# Patient Record
Sex: Male | Born: 1945
Health system: Southern US, Community
[De-identification: ages and names within clinical notes are randomized; demographics above are authoritative.]

## PROBLEM LIST (undated history)

## (undated) DIAGNOSIS — M109 Gout, unspecified: Secondary | ICD-10-CM

## (undated) DIAGNOSIS — R42 Dizziness and giddiness: Secondary | ICD-10-CM

## (undated) DIAGNOSIS — Z87442 Personal history of urinary calculi: Secondary | ICD-10-CM

## (undated) DIAGNOSIS — F419 Anxiety disorder, unspecified: Secondary | ICD-10-CM

## (undated) DIAGNOSIS — J45909 Unspecified asthma, uncomplicated: Secondary | ICD-10-CM

## (undated) DIAGNOSIS — I10 Essential (primary) hypertension: Secondary | ICD-10-CM

## (undated) DIAGNOSIS — K760 Fatty (change of) liver, not elsewhere classified: Secondary | ICD-10-CM

## (undated) DIAGNOSIS — I4891 Unspecified atrial fibrillation: Secondary | ICD-10-CM

## (undated) DIAGNOSIS — F32A Depression, unspecified: Secondary | ICD-10-CM

## (undated) DIAGNOSIS — N289 Disorder of kidney and ureter, unspecified: Secondary | ICD-10-CM

## (undated) DIAGNOSIS — F329 Major depressive disorder, single episode, unspecified: Secondary | ICD-10-CM

## (undated) DIAGNOSIS — M1990 Unspecified osteoarthritis, unspecified site: Secondary | ICD-10-CM

## (undated) DIAGNOSIS — R0600 Dyspnea, unspecified: Secondary | ICD-10-CM

## (undated) DIAGNOSIS — R748 Abnormal levels of other serum enzymes: Secondary | ICD-10-CM

## (undated) DIAGNOSIS — E785 Hyperlipidemia, unspecified: Secondary | ICD-10-CM

## (undated) DIAGNOSIS — R252 Cramp and spasm: Secondary | ICD-10-CM

## (undated) HISTORY — DX: Unspecified osteoarthritis, unspecified site: M19.90

## (undated) HISTORY — PX: OTHER SURGICAL HISTORY: SHX169

## (undated) HISTORY — DX: Abnormal levels of other serum enzymes: R74.8

## (undated) HISTORY — DX: Gout, unspecified: M10.9

## (undated) HISTORY — PX: KNEE SURGERY: SHX244

## (undated) HISTORY — DX: Anxiety disorder, unspecified: F41.9

## (undated) HISTORY — PX: HEMORROIDECTOMY: SUR656

## (undated) HISTORY — PX: COLON SURGERY: SHX602

## (undated) HISTORY — DX: Major depressive disorder, single episode, unspecified: F32.9

## (undated) HISTORY — DX: Essential (primary) hypertension: I10

## (undated) HISTORY — DX: Depression, unspecified: F32.A

## (undated) HISTORY — PX: CHOLECYSTECTOMY: SHX55

## (undated) HISTORY — DX: Hyperlipidemia, unspecified: E78.5

## (undated) HISTORY — PX: EYE SURGERY: SHX253

## (undated) HISTORY — DX: Fatty (change of) liver, not elsewhere classified: K76.0

## (undated) HISTORY — PX: CARPAL TUNNEL RELEASE: SHX101

## (undated) HISTORY — DX: Unspecified atrial fibrillation: I48.91

---

## 2001-09-22 ENCOUNTER — Emergency Department (HOSPITAL_COMMUNITY): Admission: EM | Admit: 2001-09-22 | Discharge: 2001-09-22 | Payer: Self-pay | Admitting: *Deleted

## 2001-09-22 ENCOUNTER — Encounter: Payer: Self-pay | Admitting: *Deleted

## 2001-11-16 ENCOUNTER — Emergency Department (HOSPITAL_COMMUNITY): Admission: EM | Admit: 2001-11-16 | Discharge: 2001-11-17 | Payer: Self-pay | Admitting: *Deleted

## 2001-11-16 ENCOUNTER — Encounter: Payer: Self-pay | Admitting: *Deleted

## 2001-11-22 ENCOUNTER — Ambulatory Visit (HOSPITAL_COMMUNITY): Admission: RE | Admit: 2001-11-22 | Discharge: 2001-11-22 | Payer: Self-pay | Admitting: Family Medicine

## 2001-11-22 ENCOUNTER — Encounter: Payer: Self-pay | Admitting: Family Medicine

## 2001-11-30 ENCOUNTER — Ambulatory Visit (HOSPITAL_COMMUNITY): Admission: RE | Admit: 2001-11-30 | Discharge: 2001-11-30 | Payer: Self-pay | Admitting: General Surgery

## 2002-07-10 ENCOUNTER — Ambulatory Visit (HOSPITAL_COMMUNITY): Admission: RE | Admit: 2002-07-10 | Discharge: 2002-07-10 | Payer: Self-pay | Admitting: Family Medicine

## 2003-08-08 ENCOUNTER — Ambulatory Visit (HOSPITAL_COMMUNITY): Admission: RE | Admit: 2003-08-08 | Discharge: 2003-08-08 | Payer: Self-pay | Admitting: Family Medicine

## 2005-05-07 ENCOUNTER — Ambulatory Visit: Payer: Self-pay | Admitting: Orthopedic Surgery

## 2005-06-22 ENCOUNTER — Ambulatory Visit: Payer: Self-pay | Admitting: Orthopedic Surgery

## 2005-09-24 ENCOUNTER — Encounter (INDEPENDENT_AMBULATORY_CARE_PROVIDER_SITE_OTHER): Payer: Self-pay | Admitting: Orthopaedic Surgery

## 2005-09-24 ENCOUNTER — Ambulatory Visit (HOSPITAL_COMMUNITY): Admission: RE | Admit: 2005-09-24 | Discharge: 2005-09-24 | Payer: Self-pay | Admitting: Orthopaedic Surgery

## 2006-02-09 ENCOUNTER — Encounter (INDEPENDENT_AMBULATORY_CARE_PROVIDER_SITE_OTHER): Payer: Self-pay | Admitting: *Deleted

## 2006-02-09 ENCOUNTER — Ambulatory Visit (HOSPITAL_COMMUNITY): Admission: RE | Admit: 2006-02-09 | Discharge: 2006-02-09 | Payer: Self-pay | Admitting: Orthopaedic Surgery

## 2006-06-07 ENCOUNTER — Ambulatory Visit (HOSPITAL_COMMUNITY): Admission: RE | Admit: 2006-06-07 | Discharge: 2006-06-07 | Payer: Self-pay | Admitting: Family Medicine

## 2006-07-06 ENCOUNTER — Encounter (INDEPENDENT_AMBULATORY_CARE_PROVIDER_SITE_OTHER): Payer: Self-pay | Admitting: *Deleted

## 2006-07-06 ENCOUNTER — Ambulatory Visit (HOSPITAL_COMMUNITY): Admission: RE | Admit: 2006-07-06 | Discharge: 2006-07-06 | Payer: Self-pay | Admitting: Orthopaedic Surgery

## 2009-02-20 ENCOUNTER — Ambulatory Visit (HOSPITAL_COMMUNITY): Admission: RE | Admit: 2009-02-20 | Discharge: 2009-02-20 | Payer: Self-pay | Admitting: Family Medicine

## 2009-07-27 HISTORY — PX: COLONOSCOPY: SHX174

## 2010-06-05 ENCOUNTER — Ambulatory Visit (HOSPITAL_COMMUNITY): Admission: RE | Admit: 2010-06-05 | Discharge: 2010-06-05 | Payer: Self-pay | Admitting: Internal Medicine

## 2010-06-05 ENCOUNTER — Ambulatory Visit: Payer: Self-pay | Admitting: Internal Medicine

## 2010-12-12 NOTE — Op Note (Signed)
NAME:  Jack Mejia, Jack Mejia               ACCOUNT NO.:  [id number]   MEDICAL RECORD NO.:  [id number]          PATIENT TYPE:  AMB   LOCATION:  DAY                           FACILITY:  [facility]   PHYSICIAN:  J. Sanjuana Kava, M.D. DATE OF BIRTH:  Dec 09, 1945   DATE OF PROCEDURE:  09/24/2005  DATE OF DISCHARGE:                                 OPERATIVE REPORT   PREOPERATIVE DIAGNOSIS:  Carpal tunnel syndrome, left, plus left long  trigger finger (stenosing tenosynovitis).   POSTOPERATIVE DIAGNOSIS:  Carpal tunnel syndrome, left, plus left long  trigger finger (stenosing tenosynovitis).   PROCEDURE:  Release of the left volar carpal ligament, saline neurolysis  aponeurotomy and also release of the left A1 pulley to the long finger.   ANESTHESIA:  Anesthesia was obtained bier block, then modified anesthetic  care.   Volar plasty splint applied at the end of the procedure. No drains.  Tourniquet time is listed in the anesthesia record; please refer to that.   INDICATIONS:  The patient is a 65 year old male with carpal tunnel syndrome.  He has had nerve studies showing it is severe, actually is worse on the  right than the left, but he is symptomatic more on the left. He has locking  of the left long finger. I have explained the risks and imponderables of the  procedure with him. I told him we could do both the finger and the carpal  tunnel release at the same time. He understands and agrees to the procedure  as outlined.   DESCRIPTION OF PROCEDURE:  The patient was seen in the holding area. The  left hand was identified as the correct surgical site. The left palm area  had a place marked and also over the left palmar aspect of the long finger.  The patient brought back to the operating room and given bier block  anesthesia. He was prepped and draped in the usual manner. As we made the  incision, he had pain. I stopped. Anesthesia attempted to do other things to  control his pain but  unsuccessful, so they went in and did a modified  anesthetic care with a general-type anesthetic. Incision was then made. The  volar carpal ligament area was exposed. The median nerve was identified and  a vessel loop placed around it. A groove director placed in the carpal  tunnel space, and the volar carpal ligament was incised. It was obvious that  median nerve has been compressed significantly. Saline neurolysis  aponeurotomy carried out. The retinaculum cut proximally. Nerve inspected;  no apparent injuries. Wound reapproximated using 3-0 nylon interrupted  vertical. Long finger incision was made. The A1 pulley was identified.  Tendon sheath was identified. The neurovascular bundles were identified and  retracted. Incision was made in the volar portion of the A1 pulley and was  incised and excised. We could see there was a defect on the tendon itself  and the flexors. I passively moved the finger and could see where it had  been rubbing. Proximally, some excess tendon sheath was removed. There was  no further locking but doing it  passively because he had a general  anesthetic and could not move his fingers actively. Neurovascular bundles  were inspected. No apparent injury. Wound was reapproximated using 3-0 nylon  interrupted  vertical mattress manner. Sterile dressing applied. The patient will be  given Vicodin ES for pain. I will see him in the office in approximately 10  days to 2 weeks. If any difficulties, he is to contact me through the office  or hospital beeper system. Numbers have been provided.           ______________________________  Lenna Sciara. Sanjuana Kava, M.D.     JWK/MEDQ  D:  09/24/2005  T:  09/24/2005  Job:  [id number]

## 2010-12-12 NOTE — Op Note (Signed)
[facility]  Patient:    Jack Mejia, Jack Mejia Visit Number: [id number] MRN: [id number]          Service Type: DSU Location: DAY Attending Physician:  Jamesetta So Dictated by:   Aviva Signs, M.D. Proc. Date: 11/30/01 Admit Date:  11/30/2001   CC:         Rosemary Holms, M.D.   Operative Report  AGE:  65 years old.  PREOPERATIVE DIAGNOSIS:  Cholecystitis and cholelithiasis.  POSTOPERATIVE DIAGNOSIS:  Cholecystitis and cholelithiasis.  PROCEDURE:  Laparoscopic cholecystectomy.  SURGEON:  Aviva Signs, M.D.  ANESTHESIA:  General endotracheal.  INDICATIONS:  The patient is a 65 year old white male who presents with biliary colic secondary to cholelithiasis.  The risks and benefits of the procedure including bleeding, infection, hepatobiliary injury, and the possibility of an open procedure were fully explained to the patient. I gave him an informed consent.  PROCEDURE NOTE:  The patient was placed in the supine position.  After the induction of general endotracheal anesthesia, the abdomen was prepped and draped using the usual sterile technique with Betadine.  A supraumbilical incision was made down to the fascia.  A Verres needle was introduced into the abdominal cavity, and confirmation of placement was done using the saline drop test.  The abdomen was then insufflated to 16 mmHg.  An 11 mm trocar was introduced into the abdominal cavity under direct visualization without difficulty.  The patient was placed in a reverse Trendelenburg position, and an additional 11 mm trocar was placed in the epigastric region, and 5 mm trocars were placed in the right upper quadrant and right flank regions. The liver was inspected and noted to be within normal limits.  The gallbladder was retracted superiorly and laterally.  The dissection was begun around the infundibulum of the gallbladder.  The cystic duct was first identified.  Its juncture to the  infundibulum was fully identified, and the clips were placed proximally and distally on the cystic duct, and the cystic duct was divided.  This was likewise done to the cystic artery. The gallbladder was then freed away from the gallbladder fossa using Bovie electrocautery. The gallbladder was delivered through the epigastric trocar site using an endocatch bag.  The gallbladder fossa was inspected, and no abnormal bleeding or bile leakage was noted.  Surgicel was placed in the gallbladder fossa.  The subhepatic space, as well as the right hepatic gutter were irrigated with normal saline.  All fluid and air were then evacuated from the abdominal cavity prior to removal of the trocars.  All wounds were irrigated with normal saline.  All wounds were injected with 0.5% Marcaine.  The supraumbilical fascia, as well as epigastric fascia were reapproximated using a 0 Vicryl interrupted suture.  All skin incisions were closed using staples.  Betadine ointment and dry, sterile dressings were applied.  All tape and needle counts were correct at the end of the procedure.  The patient was extubated in the operating room and went back to the recovery room, awake and in stable condition.  COMPLICATIONS:  None.  SPECIMENS:  Gallbladder with stones.  BLOOD LOSS:  Minimal. Dictated by:   Aviva Signs, M.D. Attending Physician:  Jamesetta So DD:  11/30/01 TD:  12/01/01 Job: [id number]

## 2010-12-12 NOTE — H&P (Signed)
[facility]  Patient:    Jack Mejia, Jack Mejia Visit Number: [id number] MRN: [id number]          Service Type: OUT Location: RAD Attending Physician:  Rubbie Battiest Dictated by:   Aviva Signs, M.D. Admit Date:  11/22/2001 Discharge Date: 11/22/2001   CC:         Margaretmary Eddy, M.D.   History and Physical  DATE OF BIRTH:  10/09/1945  CHIEF COMPLAINT:  Biliary colic, cholelithiasis.  HISTORY OF PRESENT ILLNESS:  The patient is a 65 year old white male who is referred for evaluation and treatment of right upper quadrant abdominal pain and nausea.  This has been occurring with increasing intensity over the past few weeks.  He is having right upper quadrant abdominal pain with radiation around to the right flank, nausea, vomiting, and bloating.  Food has not been helpful.  No fever, chills, or jaundice have been noted.  PAST MEDICAL HISTORY:  Unremarkable.  PAST SURGICAL HISTORY:  Past surgical history includes hemorrhoidectomy, eye surgery.  CURRENT MEDICATIONS:  None.  ALLERGIES:  No known drug allergies.  REVIEW OF SYSTEMS:  Unremarkable.  PHYSICAL EXAMINATION:  GENERAL:  On physical examination, the patient is a well-developed, well-nourished white male in no acute distress.  VITAL SIGNS:  He is afebrile and vital signs are stable.  HEENT:  Examination reveals no scleral icterus.  LUNGS:  Clear to auscultation with equal breath sounds bilaterally.  HEART:  Examination reveals a regular rate and rhythm, without S3, S4 or murmurs.   ABDOMEN:  Soft and nondistended.  He is tenderness to palpation in the right upper quadrant.  No hepatosplenomegaly, masses, or herniae are identified.  LABORATORY AND ACCESSORY DATA:  Ultrasound of the gallbladder reveals sludge.  IMPRESSION: 1. Biliary colic. 2. Cholelithiasis.  PLAN:  The patient is scheduled for a laparoscopic cholecystectomy on Nov 30, 2001.  The risks and benefits of the  procedure including bleeding, infection, hepatobiliary injury, and the possibility of an open procedure were fully explained to the patient, who gave informed consent. Dictated by:   Aviva Signs, M.D. Attending Physician:  Rubbie Battiest DD:  11/24/01 TD:  11/25/01 Job: [id number]

## 2010-12-12 NOTE — Op Note (Signed)
NAME:  Jack Mejia, Jack Mejia               ACCOUNT NO.:  [id number]   MEDICAL RECORD NO.:  [id number]          PATIENT TYPE:  AMB   LOCATION:                                FACILITY:  [facility]   PHYSICIAN:  J. Sanjuana Kava, M.D. DATE OF BIRTH:  [birth year]   DATE OF PROCEDURE:  DATE OF DISCHARGE:                                 OPERATIVE REPORT   PREOPERATIVE DIAGNOSIS:  Carpal tunnel syndrome, right.   POSTOPERATIVE DIAGNOSIS:  Carpal tunnel syndrome, right.   PROCEDURE:  Open release of the volar carpal ligament, right wrist.  Saline  neurolysis epineurotomy, right.   ANESTHESIA:  General.   TOURNIQUET TIME:  Fourteen minutes.   DRAINS:  None.   Volar plaster splint applied at the end of the procedure.   SURGEON:  J. Sanjuana Kava, M.D.   INDICATIONS:  A 65 year old male with positive EMG showing bilateral carpal  tunnel syndrome.  He has undergone carpal tunnel release on the left by me  and has done well.  Now he has carpal tunnel syndrome symptoms on the right.  He has had previous conservative treatment.  Has had splinting, anti-  inflammatories and rest.  He has got decreased sensation in the median nerve  distribution.  He entrusted me with the procedure and understands the risks  and imponderables.   DESCRIPTION OF PROCEDURE:  The patient was seen in the holding area.  The  right wrist was identified as the correct surgical site.  He placed a marker  on the right wrist.  I placed a marker on the right volar wrist.  He was  taken back to the operating room and given general anesthesia and laid  supine on the operating room table.  Hand table was attached.  Tourniquet  placed deflated on the right upper arm.  Prepped and draped in the usual  manner.  A time-out reidentified Mr. Maunu as the patient and the right  wrist as the correct surgical site.   The arm was elevated with circumferential Esmarch bandage.  Tourniquet  inflated to 300 mmHg.  Incision made and with careful  dissection the median  nerve was identified proximally and vessel loop placed around the nerve.  A  groove director was then inserted.  The volar carpal ligament was incised.  There was obvious compression to the nerve.  The nerve was freed proximally  in the retinacular area.  Saline neurolysis epineurotomy out.  The nerve was  inspected and no apparent injury.  Wound approximation with 3-0 nylon  interrupted vertical mattress manner.  Sterile dressing applied.  Bulky  dressing applied.  Sheet cotton applied.  Sheet cotton cut dorsally.  Volar  plaster  splint applied.  Ace bandage applied loosely.  Tolerated the procedure well  and went to recovery in good condition.  Prescription for Vicodin ES given  for pain.  I will see him in the office in approximately 2 weeks.  If he has  any difficulties, to contact me at the office, hospital beeper system.  Numbers have been provided.  ______________________________  Lenna Sciara. Sanjuana Kava, M.D.     JWK/MEDQ  D:  02/09/2006  T:  02/09/2006  Job:  [id number]

## 2010-12-12 NOTE — H&P (Signed)
NAME:  Jack Mejia, Jack Mejia               ACCOUNT NO.:  [id number]   MEDICAL RECORD NO.:  [id number]           PATIENT TYPE:  AMB   LOCATION:  DAY                           FACILITY:  [facility]   PHYSICIAN:  J. Sanjuana Kava, M.D. DATE OF BIRTH:  [birth year]   DATE OF ADMISSION:  DATE OF DISCHARGE:  LH                                HISTORY & PHYSICAL   CHIEF COMPLAINT:  Carpal tunnel syndrome and trigger finger.   The patient is a 65 year old male with several-month history of pain and  tenderness in both hands.  He has been dropping things.  There is night  pain, night paresthesias.  Problem is getting worse.  Dr. Merlene Laughter did a  nerve conduction study on June 04, 2005, which showed severe carpal  tunnel on the right, moderate carpal tunnel on the left, with a sensory  polyneuropathy.  He questions the patient had a tendency toward diabetes.  The patient has been seen by Dr. Wolfgang Phoenix and says his blood work has been  negative.  The patient is not getting any better and wants to have surgery  done at this time. Risks and imponderables of the procedure have been  discussed with the patient who appears to understand and agrees to procedure  as outlined.   There is another problem.  He is having problems with his hand and popping  and locking of the left long finger.  He has a left long trigger finger.  He  would like to go ahead and have this resolved at the same time.   PAST MEDICAL HISTORY:  Negative.  Denies heart disease, lung disease, kidney  disease, stroke, paralysis, weakness, hypertension, diabetes, night fever,  cancer, peptic ulcer disease, cervical problems.   ALLERGIES:  DEMEROL.   MEDICATIONS:  He is currently on no medications.   SOCIAL HISTORY:  He does not smoke or drink alcoholic beverages.  He works  for the [employer].  He lives in [place].   PAST SURGICAL HISTORY:  1.  Status post hemorrhoid surgery.  2.  Cholecystectomy.  3.  Cataract surgery.   FAMILY  HISTORY:  His mother had lung cancer.   PHYSICAL EXAMINATION:  GENERAL:  The patient is alert, cooperative,  oriented.  VITAL SIGNS: Blood pressure 170/78, pulse 88, respirations 18, afebrile.  He  is 5 feet 10 inches, 247 pounds.  HEENT: Negative.  NECK:  Supple.  LUNGS: Clear to percussion and auscultation.  EXTREMITIES: Bilateral Tinel's sign, bilateral Phalen's sign in both wrists.  Decreased sensation median nerve distribution.  Left long finger has some  triggering.  Neurovascularly intact.  Other extremities normal.  CNS: Intact.  SKIN: Intact.   IMPRESSION:  1.  Carpal tunnel syndrome bilaterally, left greater than right.  2.  Long finger.   Labs are pending.                                            ______________________________  J. Sanjuana Kava, M.D.  JWK/MEDQ  D:  09/23/2005  T:  09/23/2005  Job:  [id number]

## 2010-12-12 NOTE — Op Note (Signed)
NAME:  Jack Mejia, Jack Mejia               ACCOUNT NO.:  [id number]   MEDICAL RECORD NO.:  [id number]          PATIENT TYPE:  AMB   LOCATION:  DAY                           FACILITY:  [facility]   PHYSICIAN:  J. Sanjuana Kava, M.D. DATE OF BIRTH:  22-Dec-1945   DATE OF PROCEDURE:  07/06/2006  DATE OF DISCHARGE:                               OPERATIVE REPORT   PREOPERATIVE DIAGNOSIS:  Tear medial meniscus left knee.   POSTOPERATIVE DIAGNOSIS:  Tear medial meniscus left knee.   PROCEDURE:  Operative arthroscopy, partial medial meniscectomy using the  laser.   ANESTHESIA:  General.   TOURNIQUET TIME:  25 minutes.   SURGEON:  J. Sanjuana Kava, M.D.   INDICATIONS FOR PROCEDURE:  The patient is a 65 year old male with pain  and tenderness in his left knee with giving way, pain, and swelling.  MRI shows tear at posterior horn of medial meniscus.  He has  degenerative joint disease.  He has not improved with conservative  treatment.  Risks and imponderables of the procedure were discussed  preoperatively.  He appeared to understand and agreed to the procedure  as outlined.   DESCRIPTION OF PROCEDURE:  The patient was seen in the holding area.  He  identified the left knee as the correct surgical site and placed a mark  on it and I placed a mark on the left knee.  He was brought to the  operating room and placed supine.  He was given general anesthesia.  A  leg holder and tourniquet placed deflated on the left upper thigh.  He  was prepped and draped in the usual fashion.  At that time I identified  the left knee as the correct surgical site and [name] as the correct  patient.  The leg was elevated and wrapped circumferentially with  Esmarch bandage.  Tourniquet inflated to 300 mmHg.  Esmarch bandage  removed.  The inflow cannula was inserted medially and lead to  __________ by an infusion pump.  Arthroscope inserted laterally and the  knee systematically examined.   Findings:  There was  some grade 2-3 changes on the surface of the  patella at the patellofemoral joint, mild synovitis present.  Medially  there was a small tear in the posterior horn of the medial meniscus.  He  had significant degenerative joint disease grade 2 to early grade 4  changes of femoral condyle and somewhat on the tibial plateau medially.  Anterior cruciate was intact laterally.  The menisci looked good and  there was grade 2 changes there.  No loose bodies.   Using a meniscal punch, the shaver, and the Holmium laser, the meniscus  was removed from the posterior horn and good smooth contour was  obtained.  Permanent pictures were taken.  The knee was systemically  reexamined and no pathology found.  Wound was reapproximated with 3-0  nylon interrupted vertical mattress manner.  Marcaine  0.25% instilled in each portal.  Tourniquet deflated for 25 minutes.  Sterile dressing applied and bulky dressing applied.  The patient was  given Vicodin ES for pain and I will  see him in the office in  approximately 10 days to 2 weeks.  Physical therapy has been arranged.  She is to call with any difficulty or any problems.  He has the numbers.           ______________________________  Lenna Sciara. Sanjuana Kava, M.D.     JWK/MEDQ  D:  07/06/2006  T:  07/06/2006  Job:  [id number]

## 2010-12-12 NOTE — Procedures (Signed)
   NAME:  AMILIO, JUNIPER                         ACCOUNT NO.:  [id number]   MEDICAL RECORD NO.:  [id number]                   PATIENT TYPE:  OUT   LOCATION:  RAD                                  FACILITY:  [facility]   PHYSICIAN:  Jacqulyn Ducking, M.D. [facility]           DATE OF BIRTH:  [birth year]   DATE OF PROCEDURE:  07/10/2002  DATE OF DISCHARGE:                                  ECHOCARDIOGRAM   CLINICAL DATA:  A 65 year old gentleman with cardiomegaly and chest pain.   M-mode on admission of his aorta 2.8, left atrium 4.3, septum 1.7, posterior  wall 1.2.  LV diastole 4.1, LV systole 2.2.   RESULTS:  1. Technically difficult and limited echocardiographic study.  2. Very mild left atrial enlargement, normal right ventricle size and     function.  3. No abnormalities of aortic, mitral, tricuspid nor pulmonic valves     identified.  4. Normal internal dimension of the left ventricle; mild to moderate     hypertrophy with disproportionate involvement of the proximal septum.     Normal regional and global LV systolic function.                                               Jacqulyn Ducking, M.D. [facility]    RR/MEDQ  D:  07/11/2002  T:  07/12/2002  Job:  [id number]

## 2010-12-12 NOTE — H&P (Signed)
NAME:  Jack Mejia, Jack Mejia               ACCOUNT NO.:  [id number]   MEDICAL RECORD NO.:  [id number]           PATIENT TYPE:  AMB   LOCATION:  DAY                           [facility]   PHYSICIAN:  J. Sanjuana Kava, M.D. DATE OF BIRTH:  [birth year]   DATE OF ADMISSION:  DATE OF DISCHARGE:  LH                              HISTORY & PHYSICAL   CHIEF COMPLAINT:  Left knee pain.   HISTORY OF PRESENT ILLNESS:  The patient has been having left knee for  approximately a month.  He was seen by Dr. Viona Gilmore. Rosemary Holms, and  evaluated for his left knee with an MRI done on 11/12.  IT showed a tear  of the medial meniscus posterior horn in the root area.  He has had  giving way of his knee, swelling, and tenderness.  He has not improved  with conventional treatment.  I saw him in the office on 11/21/ and his  knee has gotten progressively worse.  He elects to have surgery at this  time.  He has recently retired from the [employer], as an  employee, on 06/25/2006.   PAST HISTORY:  Positive for carpal tunnel syndrome, and he has bilateral  carpal tunnel surgery on both wrists over the past year.  He is status  post hemorrhoid surgery, status post cholecystectomy, and status post  cataract surgery.  Past history is otherwise negative for heart disease,  lung disease, skin disease, stroke, paralysis, weakness, hypertension,  diabetes, cancer, or peptic ulcer disease.   ALLERGIES:  DEMEROL.   MEDICATIONS:  He is taking Vicodin for pain.   SOCIAL HISTORY:  His mother had lung cancer.  He has just retired. He  does not smoke nor use alcoholic beverages.  He lives in [place].   PHYSICAL EXAMINATION:  VITAL SIGNS:  The patient's vital signs are  normal.  HEENT:  Negative.  NECK:  Supple.  LUNGS:  Clear to P&A.  HEART:  Regular rhythm without murmur heard.  ABDOMEN:  Soft, nontender without mass.  EXTREMITIES:  Left knee is swollen with tenderness, pain medially, and  effusion.  He has  crepitus.  He has well-healed scars on both wrists.  Other extremities are negative.  CENTRAL NERVOUS SYSTEM:  Intact.  SKIN:  Intact.   IMPRESSION:  1. Tear of medial meniscus left knee, degenerative joint disease left      knee.  2. Status post bilateral carpal tunnel releases.   PLAN:  Operative arthroscopy of the left knee as an outpatient.  The  risks and imponderables have been discussed with the patient who appears  to understand and agreed to the procedure as outlined.  Labs are  pending.                                            ______________________________  J. Sanjuana Kava, M.D.     JWK/MEDQ  D:  07/05/2006  T:  07/05/2006  Job:  [id number]

## 2011-07-07 ENCOUNTER — Ambulatory Visit (INDEPENDENT_AMBULATORY_CARE_PROVIDER_SITE_OTHER): Payer: Medicare Other | Admitting: Internal Medicine

## 2011-07-07 ENCOUNTER — Encounter (INDEPENDENT_AMBULATORY_CARE_PROVIDER_SITE_OTHER): Payer: Self-pay | Admitting: Internal Medicine

## 2011-07-07 DIAGNOSIS — E119 Type 2 diabetes mellitus without complications: Secondary | ICD-10-CM

## 2011-07-07 DIAGNOSIS — K760 Fatty (change of) liver, not elsewhere classified: Secondary | ICD-10-CM | POA: Insufficient documentation

## 2011-07-07 DIAGNOSIS — E1121 Type 2 diabetes mellitus with diabetic nephropathy: Secondary | ICD-10-CM | POA: Insufficient documentation

## 2011-07-07 DIAGNOSIS — R748 Abnormal levels of other serum enzymes: Secondary | ICD-10-CM

## 2011-07-07 DIAGNOSIS — R7401 Elevation of levels of liver transaminase levels: Secondary | ICD-10-CM

## 2011-07-07 DIAGNOSIS — K7689 Other specified diseases of liver: Secondary | ICD-10-CM

## 2011-07-07 DIAGNOSIS — I10 Essential (primary) hypertension: Secondary | ICD-10-CM | POA: Insufficient documentation

## 2011-07-07 DIAGNOSIS — R7402 Elevation of levels of lactic acid dehydrogenase (LDH): Secondary | ICD-10-CM

## 2011-07-07 LAB — COMPREHENSIVE METABOLIC PANEL
ALT: 67 U/L — ABNORMAL HIGH (ref 0–53)
AST: 45 U/L — ABNORMAL HIGH (ref 0–37)
Albumin: 4.4 g/dL (ref 3.5–5.2)
Alkaline Phosphatase: 66 U/L (ref 39–117)
BUN: 11 mg/dL (ref 6–23)
CO2: 24 mEq/L (ref 19–32)
Calcium: 9.4 mg/dL (ref 8.4–10.5)
Chloride: 102 mEq/L (ref 96–112)
Creat: 0.94 mg/dL (ref 0.50–1.35)
Glucose, Bld: 261 mg/dL — ABNORMAL HIGH (ref 70–99)
Potassium: 4.1 mEq/L (ref 3.5–5.3)
Sodium: 137 mEq/L (ref 135–145)
Total Bilirubin: 0.5 mg/dL (ref 0.3–1.2)
Total Protein: 6.7 g/dL (ref 6.0–8.3)

## 2011-07-07 NOTE — Patient Instructions (Signed)
C met today. If trending down, will repeat in 6 months.

## 2011-07-07 NOTE — Progress Notes (Addendum)
Subjective:     Patient ID: Jack Mejia, male   DOB: July 13, 1946, 65 y.o.   MRN: [id number]  HPICurtis is a 65 yr old white male referred to our off by Sallee Lange for elevated liver enzymes.  Noted on 06/24/2011 ALP 52, AST 50, ALT 60, Microalbumin elevated at 28.13. He was last seen in our office in August of 2010 for elevated transaminases, fatty liver on CT of February 20, 2009.   Noted on 01/31/2009 SGOT 41 and SGPT 73.  They had been normal in June of 2009. His Hepatitis B surface antigen was negative, Hepatitis C virus antibody wa also negative and his ferritin was mildly elevated with focal abnormalities.  02/20/2009 Hepatic US showed diffusely echogenic liver without focal abnormalities. Changes were felt to be consistent with fatty liver. Evidence of prior cholecystectomy. Appetite has been good. No weight loss. BM x 4-5 a day which is normal for him. No melena or bright red rectal bleeding. No tattoos or IV drug use. No hx of jaundice.  Colonoscopy 07/2009 Biopsy Tubular adenoma  Review of Systems seephpi     Objective:   Physical Exam Alert and oriented. Skin warm and dry. Oral mucosa is moist. Natural teeth in good condition. Sclera anicteric, conjunctivae is pink. Thyroid not enlarged. No cervical lymphadenopathy. Lungs clear. Heart regular rate and rhythm.  Abdomen is soft. Bowel sounds are positive. No hepatomegaly. No abdominal masses felt. No tenderness.  No edema to lower extremities. Patient is alert and oriented.     Assessment:    Mildly elevated tranaminases. Fatty liver    Plan:    Repeat today. If trending down, will monitor. Diet and exercise

## 2011-07-08 ENCOUNTER — Telehealth (INDEPENDENT_AMBULATORY_CARE_PROVIDER_SITE_OTHER): Payer: Self-pay | Admitting: *Deleted

## 2011-07-08 DIAGNOSIS — R7401 Elevation of levels of liver transaminase levels: Secondary | ICD-10-CM

## 2011-07-08 NOTE — Telephone Encounter (Signed)
Per Deberah Castle, NP the patient will need a Hepatic Profile in 2 months.

## 2011-07-08 NOTE — Telephone Encounter (Signed)
Per Terri the patient is to have a C Met and LFT drawn in 2 months.

## 2011-07-08 NOTE — Telephone Encounter (Signed)
Per Jack Mejia , recheck CBC/d in 2 months. Patient is also to have a Hepatic Profile at that time as well.

## 2011-09-02 ENCOUNTER — Telehealth (INDEPENDENT_AMBULATORY_CARE_PROVIDER_SITE_OTHER): Payer: Self-pay | Admitting: *Deleted

## 2011-09-02 ENCOUNTER — Encounter (INDEPENDENT_AMBULATORY_CARE_PROVIDER_SITE_OTHER): Payer: Self-pay | Admitting: *Deleted

## 2011-09-02 NOTE — Telephone Encounter (Signed)
Lab faxed to [facility]

## 2012-01-06 ENCOUNTER — Encounter (INDEPENDENT_AMBULATORY_CARE_PROVIDER_SITE_OTHER): Payer: Self-pay | Admitting: *Deleted

## 2012-01-19 ENCOUNTER — Ambulatory Visit (INDEPENDENT_AMBULATORY_CARE_PROVIDER_SITE_OTHER): Payer: Medicare Other | Admitting: Internal Medicine

## 2012-10-13 ENCOUNTER — Ambulatory Visit (INDEPENDENT_AMBULATORY_CARE_PROVIDER_SITE_OTHER): Payer: Medicare Other | Admitting: Family Medicine

## 2012-10-13 ENCOUNTER — Encounter: Payer: Self-pay | Admitting: Family Medicine

## 2012-10-13 VITALS — BP 142/80 | Temp 98.7°F | Wt 233.0 lb

## 2012-10-13 DIAGNOSIS — K625 Hemorrhage of anus and rectum: Secondary | ICD-10-CM | POA: Insufficient documentation

## 2012-10-13 MED ORDER — PRAMOXINE HCL 1 % RE FOAM: RECTAL | Status: DC | PRN

## 2012-10-13 NOTE — Patient Instructions (Signed)
If bleeding worsens call our office.

## 2012-10-13 NOTE — Progress Notes (Signed)
  Subjective:    Patient ID: Jack Mejia, male    DOB: April 30, 1946, 67 y.o.   MRN: [id number]  Rectal Bleeding  The current episode started more than 1 week ago. The problem occurs occasionally. The pain is mild. The stool is described as mixed with blood. There was no prior successful therapy. Associated symptoms include abdominal pain and diarrhea. Pertinent negatives include no fever, no hemorrhoids and no chest pain. He has been behaving normally. He has been eating and drinking normally. His past medical history does not include abdominal surgery or recent abdominal injury. He has received no recent medical care.      Review of Systems  Constitutional: Negative for fever, activity change and appetite change.  HENT: Negative for facial swelling.   Cardiovascular: Negative for chest pain.  Gastrointestinal: Positive for abdominal pain, diarrhea and hematochezia. Negative for hemorrhoids.       History of colon polyps. Last colonoscopy just 2-1/2 years ago. Blood is intermittent generally bright. Next in with stool. Some discomfort and pain.  All other systems reviewed and are negative.       Objective:   Physical Exam  Constitutional: He appears well-developed and well-nourished.  HENT:  Head: Normocephalic and atraumatic.  Eyes: Conjunctivae are normal. Pupils are equal, round, and reactive to light.  Neck: Normal range of motion. Neck supple.  Cardiovascular: Normal rate and regular rhythm.   Pulmonary/Chest: Effort normal and breath sounds normal.  Abdominal: Bowel sounds are normal.  . Rectal exam reveals somewhat irritated and inflamed hemorrhoids. Prostate normal. No masses rectal vault. Heme-negative stool.          Assessment & Plan:  In impression #1 intermittent blood per stools with history of hemorrhoids. With colonoscopy recently done 2 years ago feel no further major workup at this time. Plan Proctofoam 3 times a day. Rationale discussed. Followup regular  visit.

## 2012-10-18 ENCOUNTER — Other Ambulatory Visit: Payer: Self-pay | Admitting: Family Medicine

## 2012-11-17 ENCOUNTER — Other Ambulatory Visit: Payer: Self-pay | Admitting: Family Medicine

## 2012-11-22 ENCOUNTER — Encounter (INDEPENDENT_AMBULATORY_CARE_PROVIDER_SITE_OTHER): Payer: Self-pay

## 2012-11-23 ENCOUNTER — Other Ambulatory Visit: Payer: Self-pay | Admitting: Family Medicine

## 2012-12-05 ENCOUNTER — Encounter: Payer: Self-pay | Admitting: Family Medicine

## 2012-12-05 ENCOUNTER — Ambulatory Visit (INDEPENDENT_AMBULATORY_CARE_PROVIDER_SITE_OTHER): Payer: Medicare Other | Admitting: Family Medicine

## 2012-12-05 VITALS — BP 140/84 | HR 80 | Ht 69.0 in | Wt 232.5 lb

## 2012-12-05 DIAGNOSIS — G609 Hereditary and idiopathic neuropathy, unspecified: Secondary | ICD-10-CM

## 2012-12-05 DIAGNOSIS — E119 Type 2 diabetes mellitus without complications: Secondary | ICD-10-CM

## 2012-12-05 DIAGNOSIS — G629 Polyneuropathy, unspecified: Secondary | ICD-10-CM | POA: Insufficient documentation

## 2012-12-05 DIAGNOSIS — N529 Male erectile dysfunction, unspecified: Secondary | ICD-10-CM | POA: Insufficient documentation

## 2012-12-05 DIAGNOSIS — E785 Hyperlipidemia, unspecified: Secondary | ICD-10-CM

## 2012-12-05 DIAGNOSIS — I10 Essential (primary) hypertension: Secondary | ICD-10-CM

## 2012-12-05 LAB — POCT GLYCOSYLATED HEMOGLOBIN (HGB A1C): Hemoglobin A1C: 7.7

## 2012-12-05 MED ORDER — HYDROCHLOROTHIAZIDE 25 MG PO TABS: 25.0000 mg | ORAL_TABLET | Freq: Every day | ORAL | Status: DC

## 2012-12-05 MED ORDER — GLYBURIDE 5 MG PO TABS: 5.0000 mg | ORAL_TABLET | Freq: Two times a day (BID) | ORAL | Status: DC

## 2012-12-05 NOTE — Patient Instructions (Addendum)
Try to increase exercise up to three times per wk.

## 2012-12-05 NOTE — Progress Notes (Signed)
  Subjective:    Patient ID: Jack Mejia, male    DOB: 1945-11-05, 67 y.o.   MRN: [id number]  Diabetes He presents for his follow-up diabetic visit. He has type 2 diabetes mellitus. His disease course has been worsening. Pertinent negatives for hypoglycemia include no confusion, dizziness or headaches. Pertinent negatives for diabetes include no blurred vision and no chest pain. There are no hypoglycemic complications. Symptoms are worsening. There are no diabetic complications. Pertinent negatives for diabetic complications include no CVA. Risk factors for coronary artery disease include hypertension and dyslipidemia. Current diabetic treatment includes diet and oral agent (monotherapy). He is compliant with treatment most of the time. His weight is increasing steadily. He is following a diabetic and high fat/cholesterol diet. He has not had a previous visit with a dietician. His home blood glucose trend is increasing steadily. His breakfast blood glucose is taken between 7-8 am. His breakfast blood glucose range is generally 140-180 mg/dl. An ACE inhibitor/angiotensin II receptor blocker is being taken. He does not see a podiatrist.Eye exam is current.  Hypertension This is a chronic problem. The current episode started more than 1 year ago. The problem has been gradually worsening ([id number] to [id number] systolics at home) since onset. Pertinent negatives include no blurred vision, chest pain or headaches. There are no associated agents to hypertension. Risk factors for coronary artery disease include diabetes mellitus and dyslipidemia. Past treatments include ACE inhibitors. The current treatment provides moderate improvement. There are no compliance problems.  There is no history of angina, CVA or heart failure.   Rect bleeding has calmed down, gone   Review of Systems  Eyes: Negative for blurred vision.  Cardiovascular: Negative for chest pain.  Neurological: Negative for dizziness and headaches.   Psychiatric/Behavioral: Negative for confusion.       Results for orders placed in visit on 12/05/12  POCT GLYCOSYLATED HEMOGLOBIN (HGB A1C)      Result Value Range   Hemoglobin A1C 7.7      Objective:   Physical Exam Alert no acute distress. HEENT normal. Vitals reviewed. Lungs clear. Heart regular rate and rhythm. Ankles without edema. Foot exam see diabetic exam       Assessment & Plan:  Impression 1 type 2 diabetes A1c 77. Still suboptimal in. #2 hypertension systolic too high for diabetic discussed with patient. Patient very reluctant to increase numbers at this time. #3 hyperlipidemia importance of diet discussed. Patient claims compliance with diet. #4 rectal bleeding pretty much has stopped at this point. Plan appropriate medications refilled. Diet exercise discussed. Easily 25 minutes spent most in discussion. WSL

## 2012-12-14 ENCOUNTER — Other Ambulatory Visit: Payer: Self-pay | Admitting: *Deleted

## 2012-12-14 MED ORDER — GLYBURIDE 5 MG PO TABS: 5.0000 mg | ORAL_TABLET | Freq: Two times a day (BID) | ORAL | Status: DC

## 2013-01-13 ENCOUNTER — Other Ambulatory Visit: Payer: Self-pay | Admitting: Family Medicine

## 2013-02-14 ENCOUNTER — Emergency Department (HOSPITAL_COMMUNITY): Payer: Medicare Other

## 2013-02-14 ENCOUNTER — Emergency Department (HOSPITAL_COMMUNITY)
Admission: EM | Admit: 2013-02-14 | Discharge: 2013-02-14 | Disposition: A | Discharge status: Home / Self Care | Payer: Medicare Other | Attending: Emergency Medicine | Admitting: Emergency Medicine

## 2013-02-14 ENCOUNTER — Encounter (HOSPITAL_COMMUNITY): Payer: Self-pay

## 2013-02-14 DIAGNOSIS — E785 Hyperlipidemia, unspecified: Secondary | ICD-10-CM | POA: Insufficient documentation

## 2013-02-14 DIAGNOSIS — F3289 Other specified depressive episodes: Secondary | ICD-10-CM | POA: Insufficient documentation

## 2013-02-14 DIAGNOSIS — I10 Essential (primary) hypertension: Secondary | ICD-10-CM | POA: Insufficient documentation

## 2013-02-14 DIAGNOSIS — Z9889 Other specified postprocedural states: Secondary | ICD-10-CM | POA: Insufficient documentation

## 2013-02-14 DIAGNOSIS — Z8719 Personal history of other diseases of the digestive system: Secondary | ICD-10-CM | POA: Insufficient documentation

## 2013-02-14 DIAGNOSIS — M129 Arthropathy, unspecified: Secondary | ICD-10-CM | POA: Insufficient documentation

## 2013-02-14 DIAGNOSIS — Z9089 Acquired absence of other organs: Secondary | ICD-10-CM | POA: Insufficient documentation

## 2013-02-14 DIAGNOSIS — F329 Major depressive disorder, single episode, unspecified: Secondary | ICD-10-CM | POA: Insufficient documentation

## 2013-02-14 DIAGNOSIS — E119 Type 2 diabetes mellitus without complications: Secondary | ICD-10-CM | POA: Insufficient documentation

## 2013-02-14 DIAGNOSIS — Z7982 Long term (current) use of aspirin: Secondary | ICD-10-CM | POA: Insufficient documentation

## 2013-02-14 DIAGNOSIS — Z87891 Personal history of nicotine dependence: Secondary | ICD-10-CM | POA: Insufficient documentation

## 2013-02-14 DIAGNOSIS — R1110 Vomiting, unspecified: Secondary | ICD-10-CM | POA: Insufficient documentation

## 2013-02-14 DIAGNOSIS — N200 Calculus of kidney: Secondary | ICD-10-CM | POA: Insufficient documentation

## 2013-02-14 DIAGNOSIS — Z79899 Other long term (current) drug therapy: Secondary | ICD-10-CM | POA: Insufficient documentation

## 2013-02-14 LAB — CBC WITH DIFFERENTIAL/PLATELET
Basophils Absolute: 0.1 10*3/uL (ref 0.0–0.1)
Basophils Relative: 1 % (ref 0–1)
Eosinophils Absolute: 0.2 10*3/uL (ref 0.0–0.7)
Eosinophils Relative: 2 % (ref 0–5)
HCT: 37.2 % — ABNORMAL LOW (ref 39.0–52.0)
Hemoglobin: 13.3 g/dL (ref 13.0–17.0)
Lymphocytes Relative: 23 % (ref 12–46)
Lymphs Abs: 1.5 10*3/uL (ref 0.7–4.0)
MCH: 34.5 pg — ABNORMAL HIGH (ref 26.0–34.0)
MCHC: 35.8 g/dL (ref 30.0–36.0)
MCV: 96.6 fL (ref 78.0–100.0)
Monocytes Absolute: 0.4 10*3/uL (ref 0.1–1.0)
Monocytes Relative: 6 % (ref 3–12)
Neutro Abs: 4.4 10*3/uL (ref 1.7–7.7)
Neutrophils Relative %: 68 % (ref 43–77)
Platelets: 136 10*3/uL — ABNORMAL LOW (ref 150–400)
RBC: 3.85 MIL/uL — ABNORMAL LOW (ref 4.22–5.81)
RDW: 12.8 % (ref 11.5–15.5)
WBC: 6.5 10*3/uL (ref 4.0–10.5)

## 2013-02-14 LAB — BASIC METABOLIC PANEL
BUN: 14 mg/dL (ref 6–23)
CO2: 28 mEq/L (ref 19–32)
Calcium: 9.8 mg/dL (ref 8.4–10.5)
Chloride: 98 mEq/L (ref 96–112)
Creatinine, Ser: 0.9 mg/dL (ref 0.50–1.35)
GFR calc Af Amer: >90 mL/min (ref >90)
GFR calc non Af Amer: 87 mL/min — ABNORMAL LOW (ref >90)
Glucose, Bld: 258 mg/dL — ABNORMAL HIGH (ref 70–99)
Potassium: 3.8 mEq/L (ref 3.5–5.1)
Sodium: 136 mEq/L (ref 135–145)

## 2013-02-14 MED ORDER — SODIUM CHLORIDE 0.9 % IV BOLUS (SEPSIS)
500.0000 mL | Freq: Once | INTRAVENOUS | Status: AC
  Administered 2013-02-14: 03:00:00 via INTRAVENOUS

## 2013-02-14 MED ORDER — ONDANSETRON HCL 4 MG/2ML IJ SOLN
4.0000 mg | Freq: Once | INTRAMUSCULAR | Status: AC
  Administered 2013-02-14: 4 mg via INTRAVENOUS

## 2013-02-14 MED ORDER — PROMETHAZINE HCL 25 MG PO TABS: 25.0000 mg | ORAL_TABLET | Freq: Four times a day (QID) | ORAL | Status: DC | PRN

## 2013-02-14 MED ORDER — HYDROMORPHONE HCL PF 1 MG/ML IJ SOLN
INTRAMUSCULAR | Status: AC
  Filled 2013-02-14: qty 1

## 2013-02-14 MED ORDER — HYDROMORPHONE HCL PF 1 MG/ML IJ SOLN
0.5000 mg | Freq: Once | INTRAMUSCULAR | Status: AC
  Administered 2013-02-14: 0.5 mg via INTRAVENOUS
  Filled 2013-02-14: qty 1

## 2013-02-14 MED ORDER — KETOROLAC TROMETHAMINE 30 MG/ML IJ SOLN
INTRAMUSCULAR | Status: AC
  Filled 2013-02-14: qty 1

## 2013-02-14 MED ORDER — TAMSULOSIN HCL 0.4 MG PO CAPS: 0.4000 mg | ORAL_CAPSULE | Freq: Every day | ORAL | Status: DC

## 2013-02-14 MED ORDER — ONDANSETRON HCL 4 MG/2ML IJ SOLN
4.0000 mg | Freq: Once | INTRAMUSCULAR | Status: AC
  Administered 2013-02-14: 4 mg via INTRAVENOUS
  Filled 2013-02-14: qty 2

## 2013-02-14 MED ORDER — KETOROLAC TROMETHAMINE 30 MG/ML IJ SOLN
15.0000 mg | Freq: Once | INTRAMUSCULAR | Status: AC
  Administered 2013-02-14: 15 mg via INTRAVENOUS

## 2013-02-14 MED ORDER — HYDROMORPHONE HCL PF 1 MG/ML IJ SOLN
1.0000 mg | Freq: Once | INTRAMUSCULAR | Status: AC
  Administered 2013-02-14: 1 mg via INTRAVENOUS

## 2013-02-14 MED ORDER — KETOROLAC TROMETHAMINE 30 MG/ML IJ SOLN
30.0000 mg | Freq: Once | INTRAMUSCULAR | Status: AC
  Administered 2013-02-14: 30 mg via INTRAVENOUS
  Filled 2013-02-14: qty 1

## 2013-02-14 MED ORDER — OXYCODONE-ACETAMINOPHEN 5-325 MG PO TABS: 2.0000 | ORAL_TABLET | ORAL | Status: DC | PRN

## 2013-02-14 MED ORDER — ONDANSETRON HCL 4 MG/2ML IJ SOLN
INTRAMUSCULAR | Status: AC
  Administered 2013-02-14: 4 mg via INTRAVENOUS
  Filled 2013-02-14: qty 2

## 2013-02-14 NOTE — ED Notes (Signed)
Patient states his pain has eased up. Resting quietly

## 2013-02-14 NOTE — ED Notes (Signed)
Patient in room asleep and snoring at this time. Unable to assess pain level.

## 2013-02-14 NOTE — ED Provider Notes (Signed)
History    CSN: [id number] Arrival date & time 02/14/13  0212  First MD Initiated Contact with Patient 02/14/13 0335     Chief Complaint  Patient presents with  . Flank Pain  . Emesis  . Abdominal Pain   (Consider location/radiation/quality/duration/timing/severity/associated sxs/prior Treatment) HPI..... sharp cramping right flank/right lower quadrant pain for one week, getting worse.   No dysuria, hematuria, frequency. No prior history of kidney stone. Nothing makes pain better or worse. Severity is moderate to severe. Past Medical History  Diagnosis Date  . Hypertension   . Diabetes mellitus     type 2 for 7-8 yrs  . Depression   . Arthritis   . Elevated liver enzymes   . Fatty liver   . Hyperlipidemia    Past Surgical History  Procedure Laterality Date  . Cholecystectomy    . Cataract surgery      bilateral  . Carpal tunnel release      both wrist  . Colon surgery    . Eye surgery     Family History  Problem Relation Age of Onset  . Diabetes Mother   . Cancer Mother   . Diabetes Sister   . Diabetes Maternal Grandmother    History  Substance Use Topics  . Smoking status: Former Smoker    Types: Cigarettes    Quit date: 10/14/1982  . Smokeless tobacco: Not on file     Comment: 29 yrs ago  . Alcohol Use: No    Review of Systems  All other systems reviewed and are negative.    Allergies  Demerol and Vasotec  Home Medications   Current Outpatient Rx  Name  Route  Sig  Dispense  Refill  . aspirin 81 MG tablet   Oral   Take 81 mg by mouth daily. PRN         . fish oil-omega-3 fatty acids 1000 MG capsule   Oral   Take 2 g by mouth daily. PRN         . glyBURIDE (DIABETA) 5 MG tablet   Oral   Take 1 tablet (5 mg total) by mouth 2 (two) times daily with a meal.   180 tablet   0   . hydrochlorothiazide (HYDRODIURIL) 25 MG tablet   Oral   Take 1 tablet (25 mg total) by mouth daily.   90 tablet   3   . losartan (COZAAR) 50 MG tablet       TAKE 1 TABLET TWICE DAILY   60 tablet   3   . metFORMIN (GLUCOPHAGE) 500 MG tablet      TAKE 2 TABLETS BY MOUTH TWICE A DAY   120 tablet   2   . naproxen sodium (ANAPROX) 220 MG tablet   Oral   Take 220 mg by mouth daily.          [name] oxyCODONE-acetaminophen (PERCOCET) 5-325 MG per tablet   Oral   Take 2 tablets by mouth every 4 (four) hours as needed for pain.   30 tablet   0   . promethazine (PHENERGAN) 25 MG tablet   Oral   Take 1 tablet (25 mg total) by mouth every 6 (six) hours as needed for nausea.   20 tablet   0   . tamsulosin (FLOMAX) 0.4 MG CAPS   Oral   Take 1 capsule (0.4 mg total) by mouth daily.   20 capsule   0    BP 155/74  Pulse 77  Temp(Src) 97.8 F (36.6 C) (Oral)  Resp 18  Ht 5\' 10"  (1.778 m)  Wt 230 lb (104.327 kg)  BMI 33 kg/m2  SpO2 97% Physical Exam  Nursing note and vitals reviewed. Constitutional: He is oriented to person, place, and time. He appears well-developed and well-nourished.  HENT:  Head: Normocephalic and atraumatic.  Eyes: Conjunctivae and EOM are normal. Pupils are equal, round, and reactive to light.  Neck: Normal range of motion. Neck supple.  Cardiovascular: Normal rate, regular rhythm and normal heart sounds.   Pulmonary/Chest: Effort normal and breath sounds normal.  Abdominal: Soft. Bowel sounds are normal.  Genitourinary:  Minimal tenderness right flank/right lower quadrant  Musculoskeletal: Normal range of motion.  Neurological: He is alert and oriented to person, place, and time.  Skin: Skin is warm and dry.  Psychiatric: He has a normal mood and affect.    ED Course  Procedures (including critical care time) Labs Reviewed  BASIC METABOLIC PANEL - Abnormal; Notable for the following:    Glucose, Bld 258 (*)    GFR calc non Af Amer 87 (*)    All other components within normal limits  CBC WITH DIFFERENTIAL - Abnormal; Notable for the following:    RBC 3.85 (*)    HCT 37.2 (*)    MCH 34.5 (*)     Platelets 136 (*)    All other components within normal limits   Ct Abdomen Pelvis Wo Contrast  02/14/2013   *RADIOLOGY REPORT*  Clinical Data: Right flank pain for 1 week, worsening tonight. Nausea and vomiting.  CT ABDOMEN AND PELVIS WITHOUT CONTRAST  Technique:  Multidetector CT imaging of the abdomen and pelvis was performed following the standard protocol without intravenous contrast.  Comparison: None.  Findings: The lung bases are clear.  There is a 7 x 14 mm stone in the right ureteropelvic junction with mild proximal pyelocaliectasis and pararenal stranding.  The distal right ureter is decompressed.  No additional renal, ureteral, or bladder stones are demonstrated on either side.  Low attenuation lesions in both kidneys, likely representing cysts.  Largest is on the right measuring 3.2 cm diameter.  Bladder is decompressed and cannot be evaluated for wall thickness.  Calcification noted in the base of the penis which could represent vascular calcification although this could be a passing urethral stone.  Surgical absence of the gallbladder.  The unenhanced appearance of the liver, spleen, pancreas, adrenal glands, abdominal aorta, inferior vena cava, and retroperitoneal lymph nodes is unremarkable.  The stomach and small bowel are decompressed.  Stool filled colon without distension.  No free air or free fluid in the abdomen.  Abdominal wall musculature appears intact.  Pelvis:  Prostate gland is mildly enlarged and contains calcification.  Stool filled rectosigmoid colon without evidence of diverticulitis.  Appendix is normal.  No free or loculated pelvic fluid collections.  No significant pelvic lymphadenopathy. Degenerative changes in the spine.  IMPRESSION: 7 x 14 mm stone in the right ureteropelvic junction with mild proximal obstructive change.  Nonspecific calcification in the base of the penis could represent vascular calcification repassing urethral stone.   Original Report Authenticated By:  Lucienne Capers, M.D.   Dg Abd 1 View  02/14/2013   *RADIOLOGY REPORT*  Clinical Data: Right sided flank pain for 1 week.  Emesis. Abdominal pain.  ABDOMEN - 1 VIEW  Comparison: CT abdomen and pelvis 02/14/2013  Findings: The right ureteral stones seen on CT is demonstrated to the right of L2-3 and measures about  7 x 14 mm.  No additional stones are identified.  Scattered gas and stool in the colon.  No small or large bowel distension.  Surgical clips in the right upper quadrant.  Degenerative changes in the spine.  IMPRESSION: Ovoid stone demonstrated to the right of L2-3 consistent with stone demonstrated in the right ureteropelvic junction on previous CT scan.   Original Report Authenticated By: Lucienne Capers, M.D.   1. Right kidney stone     MDM  History, physical, CT scan consistent with kidney stone. Patient feels much better after pain management. Discussed with urologist Dr.Javaid.   He will see patient in followup today  Nat Christen, MD 02/14/13 [phone]

## 2013-02-14 NOTE — Consult Note (Signed)
NAME:  Jack Mejia, Jack Mejia               ACCOUNT NO.:  [id number]  MEDICAL RECORD NO.:  [id number]  LOCATION:  APA07                         FACILITY:  [facility]  PHYSICIAN:  Marissa Nestle, M.D.DATE OF BIRTH:  [birth year]  DATE OF CONSULTATION: DATE OF DISCHARGE:                                CONSULTATION   ADDENDUM:  PHYSICAL EXAMINATION:  VITAL SIGNS:  His temperature is 97.8, pulse 77 per minute, blood pressure 155/74, and O2 saturation is 97 at room air.  LABS:  As following; WBC count is 6.5, hematocrit is 37.2.  Sodium 136, potassium 3.8, chloride 98, CO2 is 28, BUN is 14, creatinine 0.90.  IMAGING STUDIES:  CT abdomen and pelvis, I already mentioned above in the consult note.     Marissa Nestle, M.D.     MIJ/MEDQ  D:  02/14/2013  T:  02/14/2013  Job:  [id number]

## 2013-02-14 NOTE — Consult Note (Signed)
Jack Mejia, Jack Mejia               ACCOUNT NO.:  [id number]  MEDICAL RECORD NO.:  [id number]  LOCATION:  APA07                         FACILITY:  [facility]  PHYSICIAN:  Marissa Nestle, M.D.DATE OF BIRTH:  07-Aug-1945  DATE OF CONSULTATION:  02/14/2013 DATE OF DISCHARGE:                                CONSULTATION   CHIEF COMPLAINT:  Right renal colic.  HISTORY:  [name] is a 67 year old gentleman for several days who has had some discomfort in the right flank and this night, it woke him up with severe pains with nausea and vomiting.  Came to the emergency room where CT scan showed there is 14 mm stone in the right UPJ causing partial obstruction.  He was treated with Toradol and the pain has subsided.  He is comfortable now.  He is not nauseated or vomiting.  No fever, chills.  Denies any history of urinary calculi.  His other medical problems include hypertension and non-insulin- dependent diabetes.  On examination, obese male, not in acute distress, fully conscious, alert, oriented.  Laboratory was noted normal and CT scan shows 2-3 mm stone in the right UVJ, and planned ESL left and right renal calculus.  He just got Toradol injection, so I told them we cannot do the lithotripsy tomorrow when the machine is going to be here, but I suggested that he get a KUB and come to the office today.  He should call the office to make an appointment, so I can see him today and then make a decision when we can and do the lithotripsy.  I explained this to him.  He understands.     Marissa Nestle, M.D.     MIJ/MEDQ  D:  02/14/2013  T:  02/14/2013  Job:  [id number]

## 2013-02-14 NOTE — ED Notes (Signed)
Started several days ago hurting in my right side. Woke up tonight at midnight and vomited per pt. Now hurting in my abdomen per pt.

## 2013-02-14 NOTE — Consult Note (Signed)
Consult [phone]

## 2013-02-14 NOTE — ED Notes (Signed)
Patient is resting comfortably. 

## 2013-02-14 NOTE — Consult Note (Signed)
[id number]

## 2013-03-06 ENCOUNTER — Ambulatory Visit (INDEPENDENT_AMBULATORY_CARE_PROVIDER_SITE_OTHER): Payer: Medicare Other | Admitting: Family Medicine

## 2013-03-06 ENCOUNTER — Encounter: Payer: Self-pay | Admitting: Family Medicine

## 2013-03-06 VITALS — BP 148/78 | Ht 70.0 in | Wt 221.4 lb

## 2013-03-06 DIAGNOSIS — E119 Type 2 diabetes mellitus without complications: Secondary | ICD-10-CM

## 2013-03-06 LAB — POCT GLYCOSYLATED HEMOGLOBIN (HGB A1C): Hemoglobin A1C: 7.5

## 2013-03-06 NOTE — Progress Notes (Signed)
  Subjective:    Patient ID: Jack Mejia, male    DOB: 1946-05-29, 67 y.o.   MRN: [id number]  Diabetes He presents for his follow-up diabetic visit. He has type 2 diabetes mellitus. His disease course has been improving. Pertinent negatives for diabetes include no blurred vision, no chest pain and no fatigue. There are no hypoglycemic complications. Symptoms are improving. Risk factors for coronary artery disease include diabetes mellitus. His weight is stable. He is following a diabetic, high fat/cholesterol and generally healthy diet. Meal planning includes avoidance of concentrated sweets. He has not had a previous visit with a dietician. His home blood glucose trend is decreasing rapidly. His breakfast blood glucose range is generally 130-140 mg/dl. An ACE inhibitor/angiotensin II receptor blocker is being taken. He does not see a podiatrist.Eye exam is current.   Right flank pain. Sharp. Worse with motions. Just had a lithotripsy less than 2 weeks ago. No dysuria. No hematuria.  Claims compliance with his blood pressure medicine. No headache or chest pain. Blood pressure usually decent when checked.   Review of Systems  Constitutional: Negative for fatigue.  Eyes: Negative for blurred vision.  Cardiovascular: Negative for chest pain.       Objective:   Physical Exam  Alert no acute distress. HEENT normal. Lungs clear. Heart regular rate and rhythm. Right flank somewhat tender to percussion. Feet sensation good today. Pulses good. No significant edema.  Saw the eye Dr. at the [place] and had a good report.    Assessment & Plan:  Impression type 2 diabetes control decent with A1c at 7.5. Of note it was elevated at 8.8 last month at [facility] but had been without medicine for some time. #2 hypertension decent control. #3 sensory neuropathy result. #4 flank pain likely secondary to post lithotripsy symptoms plan maintain all same meds. Diet exercise discussed. Recheck in several months. WSL

## 2013-03-18 ENCOUNTER — Other Ambulatory Visit: Payer: Self-pay | Admitting: Family Medicine

## 2013-04-09 ENCOUNTER — Other Ambulatory Visit: Payer: Self-pay | Admitting: Family Medicine

## 2013-04-21 ENCOUNTER — Other Ambulatory Visit: Payer: Self-pay | Admitting: Family Medicine

## 2013-06-08 ENCOUNTER — Encounter: Payer: Self-pay | Admitting: Family Medicine

## 2013-06-08 ENCOUNTER — Ambulatory Visit (INDEPENDENT_AMBULATORY_CARE_PROVIDER_SITE_OTHER): Payer: Medicare Other | Admitting: Family Medicine

## 2013-06-08 VITALS — BP 140/80 | Ht 70.0 in | Wt 229.4 lb

## 2013-06-08 DIAGNOSIS — E119 Type 2 diabetes mellitus without complications: Secondary | ICD-10-CM

## 2013-06-08 DIAGNOSIS — I10 Essential (primary) hypertension: Secondary | ICD-10-CM

## 2013-06-08 LAB — POCT GLYCOSYLATED HEMOGLOBIN (HGB A1C): Hemoglobin A1C: 6.7

## 2013-06-08 NOTE — Progress Notes (Signed)
  Subjective:    Patient ID: Jack Mejia, male    DOB: [birth year], 67 y.o.   MRN: [id number]  Diabetes He presents for his follow-up diabetic visit. He has type 2 diabetes mellitus. His disease course has been stable. There are no hypoglycemic associated symptoms. There are no diabetic associated symptoms. There are no hypoglycemic complications. Symptoms are stable. There are no diabetic complications. There are no known risk factors for coronary artery disease. Current diabetic treatment includes oral agent (dual therapy). He is compliant with treatment all of the time.   Patient states that since he has had kidney stones, he now has a problem with leakage. freq urination stopping and starting a lot. Dribbling a bit. He wants to know is this normal or not.   Got very dizzy in the tub a couple weeks ago, some spinning and unsteadiness. Lasts just a few moments.  BP has remained good. Claims compliance with medication.  Stand dizzy but not exercising regularly.  Results for orders placed in visit on 06/08/13  POCT GLYCOSYLATED HEMOGLOBIN (HGB A1C)      Result Value Range   Hemoglobin A1C 6.7     trying to watch diet closely   Review of Systems No chest pain no back pain no loss of consciousness no headache no change in bowel habits no blood in stool ROS otherwise negative    Objective:   Physical Exam Alert HEENT normal. Blood pressure good on repeat. Lungs clear. Heart rare in rhythm. Abdomen benign.       Assessment & Plan:  Impression #1 type 2 diabetes good control discussed #2 significant dizziness at times though improved. One spell sounded like near syncope. Most other spells transient vertigo patient would prefer no major workup at this time since doing better. #3 urinary incontinence discussed. Sometimes can occur in this setting. If persists would recommend getting back to see neurologist discussed #4 hypertension good control plan maintain same meds. Diet exercise  discussed in encourage. Recheck in 6 months. WSL

## 2013-06-21 ENCOUNTER — Other Ambulatory Visit: Payer: Self-pay | Admitting: *Deleted

## 2013-06-21 MED ORDER — METFORMIN HCL 500 MG PO TABS: 500.0000 mg | ORAL_TABLET | Freq: Two times a day (BID) | ORAL | Status: DC

## 2013-06-21 MED ORDER — LOSARTAN POTASSIUM 50 MG PO TABS: 50.0000 mg | ORAL_TABLET | Freq: Every day | ORAL | Status: DC

## 2013-06-26 ENCOUNTER — Other Ambulatory Visit: Payer: Self-pay | Admitting: *Deleted

## 2013-07-10 ENCOUNTER — Other Ambulatory Visit: Payer: Self-pay | Admitting: *Deleted

## 2013-07-10 MED ORDER — GLYBURIDE 5 MG PO TABS: 5.0000 mg | ORAL_TABLET | Freq: Two times a day (BID) | ORAL | Status: DC

## 2013-07-14 ENCOUNTER — Telehealth: Payer: Self-pay | Admitting: Family Medicine

## 2013-07-14 ENCOUNTER — Other Ambulatory Visit: Payer: Self-pay | Admitting: *Deleted

## 2013-07-14 MED ORDER — GLYBURIDE 5 MG PO TABS: 5.0000 mg | ORAL_TABLET | Freq: Two times a day (BID) | ORAL | Status: DC

## 2013-07-14 NOTE — Telephone Encounter (Signed)
Med sent to [facility] in [place]. Pt notified on voicemail.

## 2013-07-14 NOTE — Telephone Encounter (Signed)
Patient needs Rx for glyburide to [facility] in [place]-He says [facility] e-scripted this but have not heard anything back.

## 2013-08-01 ENCOUNTER — Telehealth: Payer: Self-pay | Admitting: Family Medicine

## 2013-08-01 NOTE — Telephone Encounter (Signed)
Med sent and patient notified

## 2013-08-01 NOTE — Telephone Encounter (Signed)
See chart, pt needs new meter per his Insurance and strips to go with it please  Would like to have one of the ones he has put a check mark next for cost reasons   He is running out (has like 2-3 left) on his old meter.   [facility] [place]

## 2013-09-10 ENCOUNTER — Other Ambulatory Visit: Payer: Self-pay | Admitting: Family Medicine

## 2013-09-30 ENCOUNTER — Other Ambulatory Visit: Payer: Self-pay | Admitting: Family Medicine

## 2013-10-30 ENCOUNTER — Telehealth: Payer: Self-pay | Admitting: Family Medicine

## 2013-10-30 ENCOUNTER — Other Ambulatory Visit: Payer: Self-pay | Admitting: Family Medicine

## 2013-10-30 MED ORDER — GLIPIZIDE 5 MG PO TABS: 5.0000 mg | ORAL_TABLET | Freq: Two times a day (BID) | ORAL | Status: DC

## 2013-10-30 NOTE — Telephone Encounter (Signed)
Discussed with pt. New med sent to [facility]

## 2013-10-30 NOTE — Telephone Encounter (Signed)
Patient says he got a letter from his insurance [employer], [employer] (histocompatibility and immunogenetics)) and it said that they are no longer going to cover glyburide. Would like to know if you want him to try a different medication? [facility] [place]

## 2013-10-30 NOTE — Telephone Encounter (Signed)
Yes, swithch tp glipizide at same dose

## 2013-11-25 ENCOUNTER — Other Ambulatory Visit: Payer: Self-pay | Admitting: Family Medicine

## 2013-12-19 ENCOUNTER — Other Ambulatory Visit: Payer: Self-pay | Admitting: Family Medicine

## 2014-02-10 ENCOUNTER — Other Ambulatory Visit: Payer: Self-pay | Admitting: Family Medicine

## 2014-02-21 ENCOUNTER — Ambulatory Visit (INDEPENDENT_AMBULATORY_CARE_PROVIDER_SITE_OTHER): Payer: Medicare HMO | Admitting: Family Medicine

## 2014-02-21 ENCOUNTER — Encounter: Payer: Self-pay | Admitting: Family Medicine

## 2014-02-21 ENCOUNTER — Other Ambulatory Visit: Payer: Self-pay | Admitting: *Deleted

## 2014-02-21 VITALS — BP 138/80 | Ht 70.0 in | Wt 228.6 lb

## 2014-02-21 DIAGNOSIS — E119 Type 2 diabetes mellitus without complications: Secondary | ICD-10-CM

## 2014-02-21 DIAGNOSIS — G629 Polyneuropathy, unspecified: Secondary | ICD-10-CM

## 2014-02-21 DIAGNOSIS — I10 Essential (primary) hypertension: Secondary | ICD-10-CM

## 2014-02-21 DIAGNOSIS — G609 Hereditary and idiopathic neuropathy, unspecified: Secondary | ICD-10-CM

## 2014-02-21 DIAGNOSIS — Z125 Encounter for screening for malignant neoplasm of prostate: Secondary | ICD-10-CM

## 2014-02-21 DIAGNOSIS — Z79899 Other long term (current) drug therapy: Secondary | ICD-10-CM

## 2014-02-21 DIAGNOSIS — E782 Mixed hyperlipidemia: Secondary | ICD-10-CM

## 2014-02-21 LAB — POCT GLYCOSYLATED HEMOGLOBIN (HGB A1C): Hemoglobin A1C: 6.5

## 2014-02-21 MED ORDER — LOSARTAN POTASSIUM 50 MG PO TABS: 50.0000 mg | ORAL_TABLET | Freq: Every day | ORAL | Status: DC

## 2014-02-21 MED ORDER — GLUCOSE BLOOD VI STRP: ORAL_STRIP | Status: DC

## 2014-02-21 MED ORDER — HYDROCHLOROTHIAZIDE 25 MG PO TABS: ORAL_TABLET | ORAL | Status: DC

## 2014-02-21 MED ORDER — GLIPIZIDE 5 MG PO TABS: ORAL_TABLET | ORAL | Status: DC

## 2014-02-21 MED ORDER — METFORMIN HCL 500 MG PO TABS: ORAL_TABLET | ORAL | Status: DC

## 2014-02-21 NOTE — Patient Instructions (Signed)

## 2014-02-21 NOTE — Progress Notes (Signed)
   Subjective:    Patient ID: Jack Mejia, male    DOB: 03/22/1946, 68 y.o.   MRN: [id number]  Diabetes He presents for his follow-up diabetic visit. He has type 2 diabetes mellitus. There are no hypoglycemic associated symptoms. There are no hypoglycemic complications. Risk factors for coronary artery disease include diabetes mellitus, hypertension and dyslipidemia. Current diabetic treatment includes oral agent (dual therapy). He is compliant with treatment all of the time. He is following a diabetic diet. He has not had a previous visit with a dietician. He does not see a podiatrist.Eye exam is not current.   Patient stated he has been having problems with back pain.hurts at times to lay down. Deep ache. Uses aleave helps a bit. Exercise off and on three d per wk at the [facility] . may ha bruising with and has blue spots on his back he would like checke  . Patient also has a bunion on left foot he cut off himself .  Results for orders placed in visit on 02/21/14  POCT GLYCOSYLATED HEMOGLOBIN (HGB A1C)      Result Value Ref Range   Hemoglobin A1C 6.5     Patient still notes numbness and distal feet. Worse with evening time.  Intermittent low back pain. Worse when he does not exercise. Achy at times.  Compliant with blood pressure medications. Blood pressure generally in good control and checked elsewhere. Review of Systems No headache no chest pain no abdominal pain no loss of consciousness no blood in stool ROS otherwise negative    Objective:   Physical Exam  Alert no apparent distress HEENT normal vital stable. Lungs clear. Heart regular rhythm. Ankles without edema. C. diabetic foot exam      Assessment & Plan:  Impression 1 type 2 diabetes good control. #2 hypertension good control. #3 hyperlipidemia status uncertain. #4 chronic intermittent back pain discussed #5 sensory neuropathy likely secondary diabetes plan diet exercise discussed. Maintain same medications. Appropriate blood  work. WSL

## 2014-02-22 LAB — HEPATIC FUNCTION PANEL
ALT: 31 U/L (ref 0–53)
AST: 23 U/L (ref 0–37)
Albumin: 4.2 g/dL (ref 3.5–5.2)
Alkaline Phosphatase: 51 U/L (ref 39–117)
Bilirubin, Direct: 0.1 mg/dL (ref 0.0–0.3)
Indirect Bilirubin: 0.5 mg/dL (ref 0.2–1.2)
Total Bilirubin: 0.6 mg/dL (ref 0.2–1.2)
Total Protein: 6.9 g/dL (ref 6.0–8.3)

## 2014-02-22 LAB — LIPID PANEL
Cholesterol: 155 mg/dL (ref 0–200)
HDL: 35 mg/dL — ABNORMAL LOW (ref >39)
LDL Cholesterol: 82 mg/dL (ref 0–99)
Total CHOL/HDL Ratio: 4.4 Ratio
Triglycerides: 189 mg/dL — ABNORMAL HIGH (ref <150)
VLDL: 38 mg/dL (ref 0–40)

## 2014-02-22 LAB — BASIC METABOLIC PANEL
BUN: 17 mg/dL (ref 6–23)
CO2: 26 mEq/L (ref 19–32)
Calcium: 9.2 mg/dL (ref 8.4–10.5)
Chloride: 104 mEq/L (ref 96–112)
Creat: 1.16 mg/dL (ref 0.50–1.35)
Glucose, Bld: 159 mg/dL — ABNORMAL HIGH (ref 70–99)
Potassium: 4.7 mEq/L (ref 3.5–5.3)
Sodium: 140 mEq/L (ref 135–145)

## 2014-02-23 LAB — MICROALBUMIN, URINE: Microalb, Ur: 10.08 mg/dL — ABNORMAL HIGH (ref 0.00–1.89)

## 2014-02-23 LAB — PSA, MEDICARE: PSA: 0.44 ng/mL (ref <=4.00)

## 2014-02-26 ENCOUNTER — Encounter: Payer: Self-pay | Admitting: Family Medicine

## 2014-03-24 ENCOUNTER — Other Ambulatory Visit: Payer: Self-pay | Admitting: Family Medicine

## 2014-04-23 ENCOUNTER — Other Ambulatory Visit: Payer: Self-pay | Admitting: Family Medicine

## 2014-04-24 LAB — HM DIABETES EYE EXAM

## 2014-06-19 ENCOUNTER — Emergency Department (HOSPITAL_COMMUNITY): Payer: Medicare HMO

## 2014-06-19 ENCOUNTER — Other Ambulatory Visit: Payer: Self-pay

## 2014-06-19 ENCOUNTER — Encounter (HOSPITAL_COMMUNITY): Payer: Self-pay

## 2014-06-19 ENCOUNTER — Emergency Department (HOSPITAL_COMMUNITY)
Admission: EM | Admit: 2014-06-19 | Discharge: 2014-06-19 | Disposition: A | Discharge status: Home / Self Care | Payer: Medicare HMO | Attending: Emergency Medicine | Admitting: Emergency Medicine

## 2014-06-19 DIAGNOSIS — E785 Hyperlipidemia, unspecified: Secondary | ICD-10-CM | POA: Diagnosis not present

## 2014-06-19 DIAGNOSIS — R109 Unspecified abdominal pain: Secondary | ICD-10-CM | POA: Diagnosis not present

## 2014-06-19 DIAGNOSIS — R42 Dizziness and giddiness: Secondary | ICD-10-CM | POA: Diagnosis not present

## 2014-06-19 DIAGNOSIS — Z8719 Personal history of other diseases of the digestive system: Secondary | ICD-10-CM | POA: Insufficient documentation

## 2014-06-19 DIAGNOSIS — Z79899 Other long term (current) drug therapy: Secondary | ICD-10-CM | POA: Diagnosis not present

## 2014-06-19 DIAGNOSIS — E119 Type 2 diabetes mellitus without complications: Secondary | ICD-10-CM | POA: Diagnosis not present

## 2014-06-19 DIAGNOSIS — I10 Essential (primary) hypertension: Secondary | ICD-10-CM | POA: Insufficient documentation

## 2014-06-19 DIAGNOSIS — R112 Nausea with vomiting, unspecified: Secondary | ICD-10-CM | POA: Diagnosis present

## 2014-06-19 DIAGNOSIS — R231 Pallor: Secondary | ICD-10-CM | POA: Diagnosis not present

## 2014-06-19 DIAGNOSIS — M1990 Unspecified osteoarthritis, unspecified site: Secondary | ICD-10-CM | POA: Insufficient documentation

## 2014-06-19 DIAGNOSIS — Z87891 Personal history of nicotine dependence: Secondary | ICD-10-CM | POA: Diagnosis not present

## 2014-06-19 LAB — CBC WITH DIFFERENTIAL/PLATELET
Basophils Absolute: 0.1 10*3/uL (ref 0.0–0.1)
Basophils Relative: 1 % (ref 0–1)
Eosinophils Absolute: 0.3 10*3/uL (ref 0.0–0.7)
Eosinophils Relative: 2 % (ref 0–5)
HCT: 34.7 % — ABNORMAL LOW (ref 39.0–52.0)
Hemoglobin: 12.6 g/dL — ABNORMAL LOW (ref 13.0–17.0)
Lymphocytes Relative: 16 % (ref 12–46)
Lymphs Abs: 2.4 10*3/uL (ref 0.7–4.0)
MCH: 35 pg — ABNORMAL HIGH (ref 26.0–34.0)
MCHC: 36.3 g/dL — ABNORMAL HIGH (ref 30.0–36.0)
MCV: 96.4 fL (ref 78.0–100.0)
Monocytes Absolute: 0.8 10*3/uL (ref 0.1–1.0)
Monocytes Relative: 6 % (ref 3–12)
Neutro Abs: 11.3 10*3/uL — ABNORMAL HIGH (ref 1.7–7.7)
Neutrophils Relative %: 75 % (ref 43–77)
Platelets: 181 10*3/uL (ref 150–400)
RBC: 3.6 MIL/uL — ABNORMAL LOW (ref 4.22–5.81)
RDW: 13 % (ref 11.5–15.5)
WBC: 14.8 10*3/uL — ABNORMAL HIGH (ref 4.0–10.5)

## 2014-06-19 LAB — TROPONIN I: Troponin I: <0.3 ng/mL (ref <0.30)

## 2014-06-19 LAB — COMPREHENSIVE METABOLIC PANEL
ALT: 25 U/L (ref 0–53)
AST: 21 U/L (ref 0–37)
Albumin: 4.1 g/dL (ref 3.5–5.2)
Alkaline Phosphatase: 80 U/L (ref 39–117)
Anion gap: 17 — ABNORMAL HIGH (ref 5–15)
BUN: 25 mg/dL — ABNORMAL HIGH (ref 6–23)
CO2: 23 mEq/L (ref 19–32)
Calcium: 9.2 mg/dL (ref 8.4–10.5)
Chloride: 96 mEq/L (ref 96–112)
Creatinine, Ser: 1.24 mg/dL (ref 0.50–1.35)
GFR calc Af Amer: 67 mL/min — ABNORMAL LOW (ref >90)
GFR calc non Af Amer: 58 mL/min — ABNORMAL LOW (ref >90)
Glucose, Bld: 259 mg/dL — ABNORMAL HIGH (ref 70–99)
Potassium: 3.7 mEq/L (ref 3.7–5.3)
Sodium: 136 mEq/L — ABNORMAL LOW (ref 137–147)
Total Bilirubin: 0.4 mg/dL (ref 0.3–1.2)
Total Protein: 7.3 g/dL (ref 6.0–8.3)

## 2014-06-19 MED ORDER — DIAZEPAM 5 MG/ML IJ SOLN
2.5000 mg | Freq: Once | INTRAMUSCULAR | Status: AC
  Administered 2014-06-19: 2.5 mg via INTRAVENOUS
  Filled 2014-06-19: qty 2

## 2014-06-19 MED ORDER — PROMETHAZINE HCL 25 MG/ML IJ SOLN
12.5000 mg | Freq: Once | INTRAMUSCULAR | Status: AC
  Administered 2014-06-19: 12.5 mg via INTRAVENOUS
  Filled 2014-06-19: qty 1

## 2014-06-19 MED ORDER — ONDANSETRON HCL 4 MG/2ML IJ SOLN
4.0000 mg | Freq: Once | INTRAMUSCULAR | Status: AC
  Administered 2014-06-19: 4 mg via INTRAVENOUS

## 2014-06-19 MED ORDER — SODIUM CHLORIDE 0.9 % IJ SOLN
INTRAMUSCULAR | Status: AC
  Filled 2014-06-19: qty 500

## 2014-06-19 MED ORDER — ONDANSETRON HCL 4 MG/2ML IJ SOLN
4.0000 mg | Freq: Once | INTRAMUSCULAR | Status: AC
  Administered 2014-06-19: 4 mg via INTRAVENOUS
  Filled 2014-06-19: qty 2

## 2014-06-19 MED ORDER — MECLIZINE HCL 25 MG PO TABS: 25.0000 mg | ORAL_TABLET | Freq: Four times a day (QID) | ORAL | Status: DC | PRN

## 2014-06-19 MED ORDER — SODIUM CHLORIDE 0.9 % IV BOLUS (SEPSIS)
1000.0000 mL | Freq: Once | INTRAVENOUS | Status: AC
  Administered 2014-06-19: 1000 mL via INTRAVENOUS

## 2014-06-19 MED ORDER — ONDANSETRON HCL 4 MG/2ML IJ SOLN
INTRAMUSCULAR | Status: AC
  Filled 2014-06-19: qty 2

## 2014-06-19 MED ORDER — IOHEXOL 300 MG/ML  SOLN
100.0000 mL | Freq: Once | INTRAMUSCULAR | Status: AC | PRN
  Administered 2014-06-19: 100 mL via INTRAVENOUS

## 2014-06-19 MED ORDER — MECLIZINE HCL 12.5 MG PO TABS
25.0000 mg | ORAL_TABLET | Freq: Once | ORAL | Status: AC
  Administered 2014-06-19: 25 mg via ORAL
  Filled 2014-06-19: qty 2

## 2014-06-19 MED ORDER — IOHEXOL 300 MG/ML  SOLN
50.0000 mL | Freq: Once | INTRAMUSCULAR | Status: AC | PRN
  Administered 2014-06-19: 50 mL via ORAL

## 2014-06-19 MED ORDER — SODIUM CHLORIDE 0.9 % IJ SOLN
INTRAMUSCULAR | Status: AC
  Filled 2014-06-19: qty 30

## 2014-06-19 MED ORDER — LORAZEPAM 1 MG PO TABS: 1.0000 mg | ORAL_TABLET | Freq: Three times a day (TID) | ORAL | Status: DC | PRN

## 2014-06-19 NOTE — ED Notes (Signed)
Pt denies nausea at this time, pt able to stand up at bedside to use urinal with nurse assistance. Pt still c/o dizziness but states it is much improved since arrival

## 2014-06-19 NOTE — Discharge Instructions (Signed)
Benign Positional Vertigo Vertigo means you feel like you or your surroundings are moving when they are not. Benign positional vertigo is the most common form of vertigo. Benign means that the cause of your condition is not serious. Benign positional vertigo is more common in older adults. CAUSES  Benign positional vertigo is the result of an upset in the labyrinth system. This is an area in the middle ear that helps control your balance. This may be caused by a viral infection, head injury, or repetitive motion. However, often no specific cause is found. SYMPTOMS  Symptoms of benign positional vertigo occur when you move your head or eyes in different directions. Some of the symptoms may include:  Loss of balance and falls.  Vomiting.  Blurred vision.  Dizziness.  Nausea.  Involuntary eye movements (nystagmus). DIAGNOSIS  Benign positional vertigo is usually diagnosed by physical exam. If the specific cause of your benign positional vertigo is unknown, your caregiver may perform imaging tests, such as magnetic resonance imaging (MRI) or computed tomography (CT). TREATMENT  Your caregiver may recommend movements or procedures to correct the benign positional vertigo. Medicines such as meclizine, benzodiazepines, and medicines for nausea may be used to treat your symptoms. In rare cases, if your symptoms are caused by certain conditions that affect the inner ear, you may need surgery. HOME CARE INSTRUCTIONS   Follow your caregiver's instructions.  Move slowly. Do not make sudden body or head movements.  Avoid driving.  Avoid operating heavy machinery.  Avoid performing any tasks that would be dangerous to you or others during a vertigo episode.  Drink enough fluids to keep your urine clear or pale yellow. SEEK IMMEDIATE MEDICAL CARE IF:   You develop problems with walking, weakness, numbness, or using your arms, hands, or legs.  You have difficulty speaking.  You develop  severe headaches.  Your nausea or vomiting continues or gets worse.  You develop visual changes.  Your family or friends notice any behavioral changes.  Your condition gets worse.  You have a fever.  You develop a stiff neck or sensitivity to light. MAKE SURE YOU:   Understand these instructions.  Will watch your condition.  Will get help right away if you are not doing well or get worse. Document Released: 04/20/2006 Document Revised: 10/05/2011 Document Reviewed: 04/02/2011 [facility] Patient Information 2015 [place], [place]. This information is not intended to replace advice given to you by your health care provider. Make sure you discuss any questions you have with your health care provider.   Rest in quiet room. Medications for vertigo. Return if worse

## 2014-06-19 NOTE — ED Notes (Signed)
Pt. Vomiting.

## 2014-06-19 NOTE — ED Notes (Signed)
Pt. C/o dizziness when opening eyes. Pt. C/o abdominal pain.

## 2014-06-19 NOTE — ED Notes (Signed)
Patient states sore throat X1 week. Tonight patient states nausea and vomiting starting at 2300 emesis X3. Patient also c/o dizziness. Patient c/o general body aches.

## 2014-06-19 NOTE — ED Notes (Signed)
Pt. Vomiting. EDP notified.

## 2014-06-19 NOTE — ED Provider Notes (Signed)
CSN: [id number]     Arrival date & time 06/19/14  0102 History   First MD Initiated Contact with Patient 06/19/14 0217     Chief Complaint  Patient presents with  . Emesis     (Consider location/radiation/quality/duration/timing/severity/associated sxs/prior Treatment) HPI Comments: Patient is a 68 year old male with past medical history of hypertension, diabetes. He presents today with complaints of severe dizziness, nausea, and vomiting that started approximately 11:00 this evening. He was trying to sleep when his symptoms began. He reports his dizziness as a spinning sensation which is worsened with turning his head and changing position. He denies any headache or recent head trauma. He denies any hearing loss or ringing in his ears.  Patient is a 68 y.o. male presenting with vomiting. The history is provided by the patient.  Emesis Severity:  Severe Duration:  2 hours Timing:  Constant Progression:  Worsening Recent urination:  Normal Relieved by:  Nothing Exacerbated by: Movement and turning head.   Past Medical History  Diagnosis Date  . Hypertension   . Diabetes mellitus     type 2 for 7-8 yrs  . Depression   . Arthritis   . Elevated liver enzymes   . Fatty liver   . Hyperlipidemia    Past Surgical History  Procedure Laterality Date  . Cholecystectomy    . Cataract surgery      bilateral  . Carpal tunnel release      both wrist  . Colon surgery    . Eye surgery     Family History  Problem Relation Age of Onset  . Diabetes Mother   . Cancer Mother   . Diabetes Sister   . Diabetes Maternal Grandmother    History  Substance Use Topics  . Smoking status: Former Smoker    Types: Cigarettes    Quit date: 10/14/1982  . Smokeless tobacco: Not on file     Comment: 29 yrs ago  . Alcohol Use: No    Review of Systems  Gastrointestinal: Positive for vomiting.  All other systems reviewed and are negative.     Allergies  Demerol and Vasotec  Home  Medications   Prior to Admission medications   Medication Sig Start Date End Date Taking? Authorizing Provider  ACCU-CHEK AVIVA PLUS test strip USE ONE STRIP TO CHECK GLUCOSE ONCE DAILY AS DIRECTED 10/30/13   [name], MD  aspirin 81 MG tablet Take 81 mg by mouth daily. PRN    Historical Provider, MD  fish oil-omega-3 fatty acids 1000 MG capsule Take 2 g by mouth daily. PRN    Historical Provider, MD  glipiZIDE (GLUCOTROL) 5 MG tablet TAKE ONE TABLET BY MOUTH TWICE DAILY BEFORE A MEAL 02/21/14   [name], MD  glucose blood (RELION ULTIMA TEST) test strip Use as instructed 02/21/14   [name], MD  hydrochlorothiazide (HYDRODIURIL) 25 MG tablet TAKE ONE TABLET BY MOUTH ONCE DAILY 02/21/14   [name], MD  losartan (COZAAR) 50 MG tablet TAKE 1 TABLET TWICE DAILY 04/23/14   [name], MD  metFORMIN (GLUCOPHAGE) 500 MG tablet TAKE 2 TABLETS BY MOUTH TWICE A DAY. 02/21/14   [name], MD   BP 167/79 mmHg  Pulse 92  Resp 24  Ht 5\' 10"  (1.778 m)  Wt 230 lb (104.327 kg)  BMI 33.00 kg/m2  SpO2 99% Physical Exam  Constitutional: He is oriented to person, place, and time.  Patient is a 68 year old male in no acute  distress. He does appear somewhat pale and uncomfortable.  HENT:  Head: Normocephalic and atraumatic.  Mouth/Throat: Oropharynx is clear and moist.  Eyes: EOM are normal. Pupils are equal, round, and reactive to light.  Neck: Normal range of motion. Neck supple.  Cardiovascular: Normal rate, regular rhythm and normal heart sounds.   No murmur heard. Pulmonary/Chest: Effort normal and breath sounds normal. No respiratory distress. He has no wheezes.  Abdominal: Soft. Bowel sounds are normal. He exhibits no distension. There is no tenderness.  Musculoskeletal: Normal range of motion. He exhibits no edema.  Lymphadenopathy:    He has no cervical adenopathy.  Neurological: He is alert and oriented to person, place, and time. No cranial nerve deficit.  He exhibits normal muscle tone. Coordination normal.  Skin: There is pallor.  Nursing note and vitals reviewed.   ED Course  Procedures (including critical care time) Labs Review Labs Reviewed  CBC WITH DIFFERENTIAL - Abnormal; Notable for the following:    WBC 14.8 (*)    RBC 3.60 (*)    Hemoglobin 12.6 (*)    HCT 34.7 (*)    MCH 35.0 (*)    MCHC 36.3 (*)    Neutro Abs 11.3 (*)    All other components within normal limits  TROPONIN I  URINALYSIS, ROUTINE W REFLEX MICROSCOPIC  COMPREHENSIVE METABOLIC PANEL    Imaging Review No results found.   EKG Interpretation   Date/Time:  Tuesday June 19 2014 01:26:33 EST Ventricular Rate:  85 PR Interval:  168 QRS Duration: 88 QT Interval:  368 QTC Calculation: 437 R Axis:   2 Text Interpretation:  Normal sinus rhythm Nonspecific T wave abnormality  Abnormal ECG Confirmed by DELOS  MD, Remingtyn Depaola ([id number]) on 06/19/2014 4:52:13  AM      MDM   Final diagnoses:  None    Patient is a 68 year old male with history of hypertension and type 2 diabetes. He presents for evaluation of severe dizziness, nausea, and vomiting that started abruptly at 11 PM. This woke him from sleep and has been severe. He states that every time he turns his head or moves, the room begins to spin, and he becomes nauseated and vomits.  His symptoms are consistent with a peripheral vertigo, however he has been unable to tolerate meclizine without vomiting and has had no relief with normal saline, anti-emetics, and Valium. CT scan of the head was obtained and was unremarkable. As he is not improving and is unable to move without dizziness and vomiting, he will undergo an MRI this morning to further evaluate his symptoms and rule out the possibility of a posterior circulation stroke.  While in the emergency department, he also complained of severe abdominal pain. I am uncertain as to whether this is related to vomiting, however he appears quite uncomfortable.  His abdomen was reevaluated and reveals tenderness in the epigastrium and right and left upper quadrants. For this reason, a CT scan of the abdomen and pelvis will be obtained as well.  Care will be signed out to Dr. Lacinda Axon at shift change.    Veryl Speak, MD 06/20/14 0300

## 2014-06-19 NOTE — ED Provider Notes (Signed)
Recheck at 0930:   Patient feeling much better. Results of CT abdomen/pelvis and MRI brain discussed. Discharge medications Antivert 25 mg and Ativan 1 mg.  Nat Christen, MD 06/19/14 [phone]

## 2014-08-24 ENCOUNTER — Ambulatory Visit (INDEPENDENT_AMBULATORY_CARE_PROVIDER_SITE_OTHER): Payer: Medicare HMO | Admitting: Family Medicine

## 2014-08-24 ENCOUNTER — Encounter: Payer: Self-pay | Admitting: Family Medicine

## 2014-08-24 VITALS — BP 152/78 | Ht 70.0 in | Wt 227.0 lb

## 2014-08-24 DIAGNOSIS — E119 Type 2 diabetes mellitus without complications: Secondary | ICD-10-CM | POA: Diagnosis not present

## 2014-08-24 DIAGNOSIS — E785 Hyperlipidemia, unspecified: Secondary | ICD-10-CM

## 2014-08-24 DIAGNOSIS — I10 Essential (primary) hypertension: Secondary | ICD-10-CM

## 2014-08-24 LAB — POCT GLYCOSYLATED HEMOGLOBIN (HGB A1C): Hemoglobin A1C: 6.7

## 2014-08-24 NOTE — Progress Notes (Addendum)
   Subjective:    Patient ID: Jack Mejia, male    DOB: 06-13-1946, 68 y.o.   MRN: [id number]  Diabetes He presents for his follow-up diabetic visit. He has type 2 diabetes mellitus. His disease course has been stable. There are no hypoglycemic associated symptoms. (Dizziness) Current diabetic treatment includes oral agent (dual therapy). He is compliant with treatment all of the time. He participates in exercise three times a week. He monitors blood glucose at home 1-2 x per week. His highest blood glucose is 140-180 mg/dl. His overall blood glucose range is 130-140 mg/dl. He sees a podiatrist.Eye exam is current.   Comp with bp meds, no obv s e's, cyst on blood pressures usually in the 150 range  Bone spur causes aggravation at times. Primarily in the left foot. Patient has associated callusing which he trims his self.  Patient not exercising much these days.  BP number usually runs hi in the 150 neighborhood Review of Systems No vomiting no diarrhea no rash no change in bowel habits    Objective:   Physical Exam Alert no acute distress. Blood pressure 152/84 on repeat HEENT normal lungs clear heart regular in rhythm. Ankles without edema. C diabetic foot exam       Assessment & Plan:

## 2014-08-24 NOTE — Patient Instructions (Signed)
In six months we will see you for both a complete physical and your diabetes and blood pressure follow up  If you can call us two wks agead of this, we will do the blood work then so we can discuss at your visit

## 2014-09-10 ENCOUNTER — Other Ambulatory Visit: Payer: Self-pay | Admitting: Family Medicine

## 2014-11-08 ENCOUNTER — Other Ambulatory Visit: Payer: Self-pay | Admitting: Family Medicine

## 2014-11-15 ENCOUNTER — Other Ambulatory Visit: Payer: Self-pay | Admitting: Family Medicine

## 2014-11-17 ENCOUNTER — Other Ambulatory Visit: Payer: Self-pay | Admitting: Family Medicine

## 2014-12-04 ENCOUNTER — Other Ambulatory Visit: Payer: Self-pay | Admitting: Family Medicine

## 2014-12-26 ENCOUNTER — Encounter (HOSPITAL_COMMUNITY): Payer: Self-pay | Admitting: Cardiology

## 2014-12-26 ENCOUNTER — Emergency Department (HOSPITAL_COMMUNITY): Payer: Medicare HMO

## 2014-12-26 ENCOUNTER — Emergency Department (HOSPITAL_COMMUNITY)
Admission: EM | Admit: 2014-12-26 | Discharge: 2014-12-26 | Disposition: A | Discharge status: Home / Self Care | Payer: Medicare HMO | Attending: Emergency Medicine | Admitting: Emergency Medicine

## 2014-12-26 DIAGNOSIS — Z79899 Other long term (current) drug therapy: Secondary | ICD-10-CM | POA: Insufficient documentation

## 2014-12-26 DIAGNOSIS — M1990 Unspecified osteoarthritis, unspecified site: Secondary | ICD-10-CM | POA: Insufficient documentation

## 2014-12-26 DIAGNOSIS — Z791 Long term (current) use of non-steroidal anti-inflammatories (NSAID): Secondary | ICD-10-CM | POA: Insufficient documentation

## 2014-12-26 DIAGNOSIS — R42 Dizziness and giddiness: Secondary | ICD-10-CM | POA: Diagnosis not present

## 2014-12-26 DIAGNOSIS — R51 Headache: Secondary | ICD-10-CM | POA: Insufficient documentation

## 2014-12-26 DIAGNOSIS — E119 Type 2 diabetes mellitus without complications: Secondary | ICD-10-CM | POA: Insufficient documentation

## 2014-12-26 DIAGNOSIS — Z8719 Personal history of other diseases of the digestive system: Secondary | ICD-10-CM | POA: Diagnosis not present

## 2014-12-26 DIAGNOSIS — I10 Essential (primary) hypertension: Secondary | ICD-10-CM | POA: Insufficient documentation

## 2014-12-26 HISTORY — DX: Dizziness and giddiness: R42

## 2014-12-26 LAB — LIPASE, BLOOD: Lipase: 31 U/L (ref 22–51)

## 2014-12-26 LAB — COMPREHENSIVE METABOLIC PANEL
ALT: 57 U/L (ref 17–63)
AST: 43 U/L — ABNORMAL HIGH (ref 15–41)
Albumin: 4.4 g/dL (ref 3.5–5.0)
Alkaline Phosphatase: 51 U/L (ref 38–126)
Anion gap: 11 (ref 5–15)
BUN: 23 mg/dL — ABNORMAL HIGH (ref 6–20)
CO2: 25 mmol/L (ref 22–32)
Calcium: 9.4 mg/dL (ref 8.9–10.3)
Chloride: 100 mmol/L — ABNORMAL LOW (ref 101–111)
Creatinine, Ser: 1.2 mg/dL (ref 0.61–1.24)
GFR calc Af Amer: >60 mL/min (ref >60)
GFR calc non Af Amer: >60 mL/min (ref >60)
Glucose, Bld: 234 mg/dL — ABNORMAL HIGH (ref 65–99)
Potassium: 4.3 mmol/L (ref 3.5–5.1)
Sodium: 136 mmol/L (ref 135–145)
Total Bilirubin: 0.9 mg/dL (ref 0.3–1.2)
Total Protein: 7.8 g/dL (ref 6.5–8.1)

## 2014-12-26 LAB — CBC WITH DIFFERENTIAL/PLATELET
Basophils Absolute: 0 10*3/uL (ref 0.0–0.1)
Basophils Relative: 1 % (ref 0–1)
Eosinophils Absolute: 0 10*3/uL (ref 0.0–0.7)
Eosinophils Relative: 0 % (ref 0–5)
HCT: 38.5 % — ABNORMAL LOW (ref 39.0–52.0)
Hemoglobin: 13.5 g/dL (ref 13.0–17.0)
Lymphocytes Relative: 11 % — ABNORMAL LOW (ref 12–46)
Lymphs Abs: 0.8 10*3/uL (ref 0.7–4.0)
MCH: 34.3 pg — ABNORMAL HIGH (ref 26.0–34.0)
MCHC: 35.1 g/dL (ref 30.0–36.0)
MCV: 97.7 fL (ref 78.0–100.0)
Monocytes Absolute: 0.2 10*3/uL (ref 0.1–1.0)
Monocytes Relative: 3 % (ref 3–12)
Neutro Abs: 6.4 10*3/uL (ref 1.7–7.7)
Neutrophils Relative %: 85 % — ABNORMAL HIGH (ref 43–77)
Platelets: 156 10*3/uL (ref 150–400)
RBC: 3.94 MIL/uL — ABNORMAL LOW (ref 4.22–5.81)
RDW: 13 % (ref 11.5–15.5)
WBC: 7.6 10*3/uL (ref 4.0–10.5)

## 2014-12-26 MED ORDER — ONDANSETRON 4 MG PO TBDP: 4.0000 mg | ORAL_TABLET | Freq: Three times a day (TID) | ORAL | Status: DC | PRN

## 2014-12-26 MED ORDER — MECLIZINE HCL 25 MG PO TABS: ORAL_TABLET | ORAL | Status: DC

## 2014-12-26 MED ORDER — MECLIZINE HCL 12.5 MG PO TABS
25.0000 mg | ORAL_TABLET | Freq: Once | ORAL | Status: AC
  Administered 2014-12-26: 25 mg via ORAL
  Filled 2014-12-26: qty 2

## 2014-12-26 MED ORDER — DIPHENHYDRAMINE HCL 50 MG/ML IJ SOLN
25.0000 mg | Freq: Once | INTRAMUSCULAR | Status: AC
  Administered 2014-12-26: 25 mg via INTRAVENOUS
  Filled 2014-12-26: qty 1

## 2014-12-26 MED ORDER — DIAZEPAM 5 MG PO TABS: 5.0000 mg | ORAL_TABLET | Freq: Every evening | ORAL | Status: DC | PRN

## 2014-12-26 MED ORDER — ONDANSETRON HCL 4 MG/2ML IJ SOLN
4.0000 mg | Freq: Once | INTRAMUSCULAR | Status: AC
  Administered 2014-12-26: 4 mg via INTRAVENOUS
  Filled 2014-12-26: qty 2

## 2014-12-26 MED ORDER — DIAZEPAM 5 MG/ML IJ SOLN
5.0000 mg | Freq: Once | INTRAMUSCULAR | Status: AC | PRN
  Administered 2014-12-26: 5 mg via INTRAVENOUS
  Filled 2014-12-26: qty 2

## 2014-12-26 NOTE — Discharge Instructions (Signed)
Benign Positional Vertigo Vertigo means you feel like you or your surroundings are moving when they are not. Benign positional vertigo is the most common form of vertigo. Benign means that the cause of your condition is not serious. Benign positional vertigo is more common in older adults. CAUSES  Benign positional vertigo is the result of an upset in the labyrinth system. This is an area in the middle ear that helps control your balance. This may be caused by a viral infection, head injury, or repetitive motion. However, often no specific cause is found. SYMPTOMS  Symptoms of benign positional vertigo occur when you move your head or eyes in different directions. Some of the symptoms may include:  Loss of balance and falls.  Vomiting.  Blurred vision.  Dizziness.  Nausea.  Involuntary eye movements (nystagmus). DIAGNOSIS  Benign positional vertigo is usually diagnosed by physical exam. If the specific cause of your benign positional vertigo is unknown, your caregiver may perform imaging tests, such as magnetic resonance imaging (MRI) or computed tomography (CT). TREATMENT  Your caregiver may recommend movements or procedures to correct the benign positional vertigo. Medicines such as meclizine, benzodiazepines, and medicines for nausea may be used to treat your symptoms. In rare cases, if your symptoms are caused by certain conditions that affect the inner ear, you may need surgery. HOME CARE INSTRUCTIONS   Follow your caregiver's instructions.  Move slowly. Do not make sudden body or head movements.  Avoid driving.  Avoid operating heavy machinery.  Avoid performing any tasks that would be dangerous to you or others during a vertigo episode.  Drink enough fluids to keep your urine clear or pale yellow. SEEK IMMEDIATE MEDICAL CARE IF:   You develop problems with walking, weakness, numbness, or using your arms, hands, or legs.  You have difficulty speaking.  You develop  severe headaches.  Your nausea or vomiting continues or gets worse.  You develop visual changes.  Your family or friends notice any behavioral changes.  Your condition gets worse.  You have a fever.  You develop a stiff neck or sensitivity to light. MAKE SURE YOU:   Understand these instructions.  Will watch your condition.  Will get help right away if you are not doing well or get worse. Document Released: 04/20/2006 Document Revised: 10/05/2011 Document Reviewed: 04/02/2011 [employer] Patient Information 2015 [employer]. This information is not intended to replace advice given to you by your health care provider. Make sure you discuss any questions you have with your health care provider.    

## 2014-12-26 NOTE — ED Notes (Signed)
States I have had the staggers since Monday.  C/o vomiting.  Abdominal pain.

## 2014-12-26 NOTE — ED Provider Notes (Signed)
CSN: [id number]     Arrival date & time 12/26/14  1024 History  This chart was scribed for Tanna Furry, MD by Eustaquio Maize, ED Scribe. This patient was seen in room APA11/APA11 and the patient's care was started at 11:31 AM.    Chief Complaint  Patient presents with  . Dizziness   The history is provided by the patient. No language interpreter was used.     HPI Comments: Jack Mejia is a 69 y.o. male with hx HTN, HLD, and DM who presents to the Emergency Department complaining of room-spinning dizziness x 2 days. Pt reports he is staggering while walking and also becomes diaphoretic. He is also complaining of a mild headache. Pt reports similar symptoms in November 2015. He had a MRI with no acute findings and was diagnosed with vertigo. He mentions that he has had intermittent dizziness since being diagnosed but that it usually goes away quickly. This is the longest his symptoms have lasted since diagnosis. Pt took his vertigo medication yesterday but did not take it today. Pt is not on anti-coagulants. No hx strokes.    Past Medical History  Diagnosis Date  . Hypertension   . Diabetes mellitus     type 2 for 7-8 yrs  . Depression   . Arthritis   . Elevated liver enzymes   . Fatty liver   . Hyperlipidemia   . Vertigo    Past Surgical History  Procedure Laterality Date  . Cholecystectomy    . Cataract surgery      bilateral  . Carpal tunnel release      both wrist  . Colon surgery    . Eye surgery     Family History  Problem Relation Age of Onset  . Diabetes Mother   . Cancer Mother   . Diabetes Sister   . Diabetes Maternal Grandmother    History  Substance Use Topics  . Smoking status: Former Smoker    Types: Cigarettes    Quit date: 10/14/1982  . Smokeless tobacco: Not on file     Comment: 29 yrs ago  . Alcohol Use: No    Review of Systems  Constitutional: Positive for diaphoresis. Negative for fever, chills, appetite change and fatigue.  HENT: Negative  for mouth sores, sore throat and trouble swallowing.   Eyes: Negative for visual disturbance.  Respiratory: Negative for cough, chest tightness, shortness of breath and wheezing.   Cardiovascular: Negative for chest pain.  Gastrointestinal: Negative for nausea, vomiting, abdominal pain, diarrhea and abdominal distention.  Endocrine: Negative for polydipsia, polyphagia and polyuria.  Genitourinary: Negative for dysuria, frequency and hematuria.  Musculoskeletal: Positive for gait problem.  Skin: Negative for color change, pallor and rash.  Neurological: Positive for dizziness and headaches. Negative for syncope and light-headedness.  Hematological: Does not bruise/bleed easily.  Psychiatric/Behavioral: Negative for behavioral problems and confusion.      Allergies  Demerol and Vasotec  Home Medications   Prior to Admission medications   Medication Sig Start Date End Date Taking? Authorizing Provider  glipiZIDE (GLUCOTROL) 5 MG tablet TAKE ONE TABLET BY MOUTH TWICE DAILY BEFORE A MEAL 11/19/14  Yes [name], MD  glucose blood (RELION ULTIMA TEST) test strip Use as instructed 02/21/14  Yes [name], MD  hydrochlorothiazide (HYDRODIURIL) 25 MG tablet TAKE ONE TABLET BY MOUTH ONCE DAILY 09/11/14  Yes [name], MD  losartan (COZAAR) 50 MG tablet TAKE 1 TABLET BY MOUTH TWICE A DAY 12/04/14  Yes  Kathyrn Drown, MD  metFORMIN (GLUCOPHAGE) 500 MG tablet TAKE TWO TABLETS BY MOUTH TWICE DAILY 09/11/14  Yes [name], MD  naproxen sodium (ANAPROX) 220 MG tablet Take 220 mg by mouth 2 (two) times daily with a meal.   Yes Historical Provider, MD  ACCU-CHEK AVIVA PLUS test strip USE ONE STRIP TO CHECK GLUCOSE ONCE DAILY AS DIRECTED Patient not taking: Reported on 12/26/2014 10/30/13   [name], MD  diazepam (VALIUM) 5 MG tablet Take 1 tablet (5 mg total) by mouth at bedtime as needed (vertigo). 12/26/14   Tanna Furry, MD  meclizine (ANTIVERT) 25 MG tablet 1 po tid until 24  hours without vertigo 12/26/14   Tanna Furry, MD  ondansetron (ZOFRAN ODT) 4 MG disintegrating tablet Take 1 tablet (4 mg total) by mouth every 8 (eight) hours as needed for nausea. 12/26/14   Tanna Furry, MD   Triage Vitals: BP 132/85 mmHg  Pulse 63  Temp(Src) 97.7 F (36.5 C) (Oral)  Resp 18  Ht 5\' 10"  (1.778 m)  Wt 228 lb (103.42 kg)  BMI 32.71 kg/m2  SpO2 97%   Physical Exam  Constitutional: He is oriented to person, place, and time. He appears well-developed and well-nourished. No distress.  HENT:  Head: Normocephalic.  Eyes: Conjunctivae are normal. Pupils are equal, round, and reactive to light. No scleral icterus. Right eye exhibits nystagmus. Left eye exhibits nystagmus.  Horizontal nystagmus.   Neck: Normal range of motion. Neck supple. No thyromegaly present.  Cardiovascular: Normal rate and regular rhythm.  Exam reveals no gallop and no friction rub.   No murmur heard. Pulmonary/Chest: Effort normal and breath sounds normal. No respiratory distress. He has no wheezes. He has no rales.  Abdominal: Soft. Bowel sounds are normal. He exhibits no distension. There is no tenderness. There is no rebound.  Musculoskeletal: Normal range of motion.  Neurological: He is alert and oriented to person, place, and time.  Skin: Skin is warm and dry. No rash noted.  Psychiatric: He has a normal mood and affect. His behavior is normal.    ED Course  Procedures (including critical care time)  DIAGNOSTIC STUDIES: Oxygen Saturation is 97% on RA, normal by my interpretation.    COORDINATION OF CARE: 11:36 AM-Discussed treatment plan which includes CT Head, CBC, CMP, Lipase, UA with pt at bedside and pt agreed to plan.   Labs Review Labs Reviewed  CBC WITH DIFFERENTIAL/PLATELET - Abnormal; Notable for the following:    RBC 3.94 (*)    HCT 38.5 (*)    MCH 34.3 (*)    Neutrophils Relative % 85 (*)    Lymphocytes Relative 11 (*)    All other components within normal limits  COMPREHENSIVE  METABOLIC PANEL - Abnormal; Notable for the following:    Chloride 100 (*)    Glucose, Bld 234 (*)    BUN 23 (*)    AST 43 (*)    All other components within normal limits  LIPASE, BLOOD    Imaging Review Ct Head Wo Contrast  12/26/2014   CLINICAL DATA:  Two day history of dizziness with nausea and vomiting  EXAM: CT HEAD WITHOUT CONTRAST  TECHNIQUE: Contiguous axial images were obtained from the base of the skull through the vertex without intravenous contrast.  COMPARISON:  Head CT June 19, 2014; brain MRI June 19, 2014  FINDINGS: There is age related volume loss. There is no intracranial mass, hemorrhage, extra-axial fluid collection, or midline shift. The gray-white compartments  appear unremarkable by CT. No acute infarct apparent. The bony calvarium appears intact. The mastoid air cells are clear. There is opacification of several ethmoid air cells bilaterally.  IMPRESSION: Age related volume loss. No intracranial mass, hemorrhage, or evidence of acute infarct. There is ethmoid sinus disease bilaterally.   Electronically Signed   By: Lowella Grip III M.D.   On: 12/26/2014 13:27     EKG Interpretation None      MDM   Final diagnoses:  Vertigo    Reassuring CT scan. Normal MRI without acute stroke with his most recent episode of vertigo. He states he feels "much better". He is able to sit on the edge of the bed independently. Taking some by mouth fluids. States his brother has been staying with him and can be with him over the next few days to ensure that he is able to be up and around and perform his ADLs. Plan is Zofran when necessary. Antivert 3 times a day until 24 hours without vertigo. Valium as needed for nocturnal symptoms.     Tanna Furry, MD 12/26/14 1420

## 2015-01-30 ENCOUNTER — Other Ambulatory Visit: Payer: Self-pay | Admitting: Family Medicine

## 2015-02-05 ENCOUNTER — Telehealth: Payer: Self-pay | Admitting: Family Medicine

## 2015-02-05 DIAGNOSIS — E119 Type 2 diabetes mellitus without complications: Secondary | ICD-10-CM

## 2015-02-05 DIAGNOSIS — Z79899 Other long term (current) drug therapy: Secondary | ICD-10-CM

## 2015-02-05 DIAGNOSIS — I10 Essential (primary) hypertension: Secondary | ICD-10-CM

## 2015-02-05 DIAGNOSIS — Z125 Encounter for screening for malignant neoplasm of prostate: Secondary | ICD-10-CM

## 2015-02-05 DIAGNOSIS — E785 Hyperlipidemia, unspecified: Secondary | ICD-10-CM

## 2015-02-05 NOTE — Telephone Encounter (Signed)
Patient needs order for BW.

## 2015-02-05 NOTE — Telephone Encounter (Signed)
Lip liv m7 psa microprot ua

## 2015-02-06 NOTE — Telephone Encounter (Signed)
bw orders ready. Pt notified. 

## 2015-02-09 LAB — HEMOGLOBIN A1C
Est. average glucose Bld gHb Est-mCnc: 171 mg/dL
Hgb A1c MFr Bld: 7.6 % — ABNORMAL HIGH (ref 4.8–5.6)

## 2015-02-09 LAB — LIPID PANEL
Chol/HDL Ratio: 4.6 ratio units (ref 0.0–5.0)
Cholesterol, Total: 164 mg/dL (ref 100–199)
HDL: 36 mg/dL — ABNORMAL LOW (ref >39)
LDL Calculated: 84 mg/dL (ref 0–99)
Triglycerides: 222 mg/dL — ABNORMAL HIGH (ref 0–149)
VLDL Cholesterol Cal: 44 mg/dL — ABNORMAL HIGH (ref 5–40)

## 2015-02-09 LAB — MICROALBUMIN, URINE: Microalbumin, Urine: 200.7 ug/mL

## 2015-02-09 LAB — BASIC METABOLIC PANEL
BUN/Creatinine Ratio: 15 (ref 10–22)
BUN: 18 mg/dL (ref 8–27)
CO2: 21 mmol/L (ref 18–29)
Calcium: 9.7 mg/dL (ref 8.6–10.2)
Chloride: 100 mmol/L (ref 97–108)
Creatinine, Ser: 1.23 mg/dL (ref 0.76–1.27)
GFR calc Af Amer: 69 mL/min/{1.73_m2} (ref >59)
GFR calc non Af Amer: 60 mL/min/{1.73_m2} (ref >59)
Glucose: 187 mg/dL — ABNORMAL HIGH (ref 65–99)
Potassium: 4.4 mmol/L (ref 3.5–5.2)
Sodium: 140 mmol/L (ref 134–144)

## 2015-02-09 LAB — HEPATIC FUNCTION PANEL
ALT: 41 IU/L (ref 0–44)
AST: 31 IU/L (ref 0–40)
Albumin: 4.7 g/dL (ref 3.6–4.8)
Alkaline Phosphatase: 69 IU/L (ref 39–117)
Bilirubin Total: 0.7 mg/dL (ref 0.0–1.2)
Bilirubin, Direct: 0.2 mg/dL (ref 0.00–0.40)
Total Protein: 7.4 g/dL (ref 6.0–8.5)

## 2015-02-09 LAB — PSA: Prostate Specific Ag, Serum: 0.4 ng/mL (ref 0.0–4.0)

## 2015-02-15 ENCOUNTER — Encounter: Payer: Medicare HMO | Admitting: Family Medicine

## 2015-02-18 ENCOUNTER — Ambulatory Visit (INDEPENDENT_AMBULATORY_CARE_PROVIDER_SITE_OTHER): Payer: Medicare HMO | Admitting: Family Medicine

## 2015-02-18 ENCOUNTER — Encounter: Payer: Self-pay | Admitting: Family Medicine

## 2015-02-18 VITALS — BP 138/78 | Ht 70.0 in | Wt 221.6 lb

## 2015-02-18 DIAGNOSIS — E119 Type 2 diabetes mellitus without complications: Secondary | ICD-10-CM | POA: Diagnosis not present

## 2015-02-18 DIAGNOSIS — I10 Essential (primary) hypertension: Secondary | ICD-10-CM

## 2015-02-18 DIAGNOSIS — Z0000 Encounter for general adult medical examination without abnormal findings: Secondary | ICD-10-CM | POA: Diagnosis not present

## 2015-02-18 NOTE — Progress Notes (Signed)
Subjective:    Patient ID: Jack Mejia, male    DOB: 27-Oct-1945, 69 y.o.   MRN: [id number]  HPI AWV- Annual Wellness Visit  The patient was seen for their annual wellness visit. The patient's past medical history, surgical history, and family history were reviewed. Pertinent vaccines were reviewed ( tetanus, pneumonia, shingles, flu) The patient's medication list was reviewed and updated.  The height and weight were entered. The patient's current BMI is:31.8  Cognitive screening was completed. Outcome of Mini - Cog: pass  Falls within the past 6 months:none  Current tobacco usage: no (All patients who use tobacco were given written and verbal information on quitting)  Recent listing of emergency department/hospitalizations over the past year were reviewed.  current specialist the patient sees on a regular basis: none   Medicare annual wellness visit patient questionnaire was reviewed.  A written screening schedule for the patient for the next 5-10 years was given. Appropriate discussion of followup regarding next visit was discussed.  Results for orders placed or performed in visit on 02/05/15  Lipid panel  Result Value Ref Range   Cholesterol, Total 164 100 - 199 mg/dL   Triglycerides 222 (H) 0 - 149 mg/dL   HDL 36 (L) >39 mg/dL   VLDL Cholesterol Cal 44 (H) 5 - 40 mg/dL   LDL Calculated 84 0 - 99 mg/dL   Chol/HDL Ratio 4.6 0.0 - 5.0 ratio units  Hepatic function panel  Result Value Ref Range   Total Protein 7.4 6.0 - 8.5 g/dL   Albumin 4.7 3.6 - 4.8 g/dL   Bilirubin Total 0.7 0.0 - 1.2 mg/dL   Bilirubin, Direct 0.20 0.00 - 0.40 mg/dL   Alkaline Phosphatase 69 39 - 117 IU/L   AST 31 0 - 40 IU/L   ALT 41 0 - 44 IU/L  Basic metabolic panel  Result Value Ref Range   Glucose 187 (H) 65 - 99 mg/dL   BUN 18 8 - 27 mg/dL   Creatinine, Ser 1.23 0.76 - 1.27 mg/dL   GFR calc non Af Amer 60 >59 mL/min/1.73   GFR calc Af Amer 69 >59 mL/min/1.73   BUN/Creatinine Ratio  15 10 - 22   Sodium 140 134 - 144 mmol/L   Potassium 4.4 3.5 - 5.2 mmol/L   Chloride 100 97 - 108 mmol/L   CO2 21 18 - 29 mmol/L   Calcium 9.7 8.6 - 10.2 mg/dL  Microalbumin, urine  Result Value Ref Range   Microalbum.,U,Random 200.7 Not Estab. ug/mL  PSA  Result Value Ref Range   Prostate Specific Ag, Serum 0.4 0.0 - 4.0 ng/mL  Hemoglobin A1c  Result Value Ref Range   Hgb A1c MFr Bld 7.6 (H) 4.8 - 5.6 %   Est. average glucose Bld gHb Est-mCnc 171 mg/dL        Review of Systems  Constitutional: Negative for fever, activity change and appetite change.  HENT: Negative for congestion and rhinorrhea.   Eyes: Negative for discharge.  Respiratory: Negative for cough and wheezing.   Cardiovascular: Negative for chest pain.  Gastrointestinal: Negative for vomiting, abdominal pain and blood in stool.  Genitourinary: Negative for frequency and difficulty urinating.  Musculoskeletal: Negative for neck pain.  Skin: Negative for rash.  Allergic/Immunologic: Negative for environmental allergies and food allergies.  Neurological: Negative for weakness and headaches.  Psychiatric/Behavioral: Negative for agitation.  All other systems reviewed and are negative.      Objective:   Physical Exam  Constitutional: He  appears well-developed and well-nourished.  Obesity present  HENT:  Head: Normocephalic and atraumatic.  Right Ear: External ear normal.  Left Ear: External ear normal.  Nose: Nose normal.  Mouth/Throat: Oropharynx is clear and moist.  Eyes: EOM are normal. Pupils are equal, round, and reactive to light.  Neck: Normal range of motion. Neck supple. No thyromegaly present.  Cardiovascular: Normal rate, regular rhythm and normal heart sounds.   No murmur heard. Pulmonary/Chest: Effort normal and breath sounds normal. No respiratory distress. He has no wheezes.  Abdominal: Soft. Bowel sounds are normal. He exhibits no distension and no mass. There is no tenderness.    Genitourinary: Penis normal.  Musculoskeletal: Normal range of motion. He exhibits no edema.  Trace edema ankles  Lymphadenopathy:    He has no cervical adenopathy.  Neurological: He is alert. He exhibits normal muscle tone.  Skin: Skin is warm and dry. No erythema.  Psychiatric: He has a normal mood and affect. His behavior is normal. Judgment normal.  Vitals reviewed.         Assessment & Plan:  Impression 1 wellness exam patient declines vaccines #2 colonoscopy greater than 5 years: Tubular adenoma discussed #3 type 2 diabetes good control #4 hypertension good control #5 hyperlipidemia plan diet exercise discussed. Medications refilled. Encouraged to set up colonoscopy. Patient declines vaccines. Recheck in 6 months. Diet exercise discussed WSL

## 2015-02-25 ENCOUNTER — Other Ambulatory Visit: Payer: Self-pay | Admitting: Family Medicine

## 2015-03-17 ENCOUNTER — Other Ambulatory Visit: Payer: Self-pay | Admitting: Family Medicine

## 2015-03-19 ENCOUNTER — Other Ambulatory Visit: Payer: Self-pay | Admitting: Family Medicine

## 2015-04-29 DIAGNOSIS — H26499 Other secondary cataract, unspecified eye: Secondary | ICD-10-CM | POA: Diagnosis not present

## 2015-04-29 DIAGNOSIS — H34211 Partial retinal artery occlusion, right eye: Secondary | ICD-10-CM | POA: Diagnosis not present

## 2015-04-29 LAB — HM DIABETES EYE EXAM

## 2015-05-01 ENCOUNTER — Encounter: Payer: Self-pay | Admitting: *Deleted

## 2015-05-27 ENCOUNTER — Other Ambulatory Visit: Payer: Self-pay | Admitting: Family Medicine

## 2015-05-30 ENCOUNTER — Encounter (INDEPENDENT_AMBULATORY_CARE_PROVIDER_SITE_OTHER): Payer: Self-pay | Admitting: *Deleted

## 2015-06-10 ENCOUNTER — Telehealth (INDEPENDENT_AMBULATORY_CARE_PROVIDER_SITE_OTHER): Payer: Self-pay | Admitting: *Deleted

## 2015-06-10 NOTE — Telephone Encounter (Signed)
FYI: Patient was on recall for 5 yr TCS -- he states he does not want TCS. I explained he needed to have a follow up TCS since he had polyps on his last one in 2011 (paper chart). He stated again he did not want it.

## 2015-06-13 NOTE — Telephone Encounter (Signed)
If patient does not want to have a colonoscopy, we cannot force him to do it. If he changes his mind he can call us. Please send FYI to PCP.

## 2015-06-27 DIAGNOSIS — M79675 Pain in left toe(s): Secondary | ICD-10-CM | POA: Diagnosis not present

## 2015-06-27 DIAGNOSIS — M2042 Other hammer toe(s) (acquired), left foot: Secondary | ICD-10-CM | POA: Diagnosis not present

## 2015-07-20 ENCOUNTER — Other Ambulatory Visit: Payer: Self-pay | Admitting: Family Medicine

## 2015-08-21 ENCOUNTER — Ambulatory Visit: Payer: Medicare HMO | Admitting: Family Medicine

## 2015-08-22 ENCOUNTER — Encounter: Payer: Self-pay | Admitting: Family Medicine

## 2015-08-22 ENCOUNTER — Ambulatory Visit (INDEPENDENT_AMBULATORY_CARE_PROVIDER_SITE_OTHER): Payer: Medicare HMO | Admitting: Family Medicine

## 2015-08-22 VITALS — BP 136/70 | Ht 70.0 in | Wt 224.4 lb

## 2015-08-22 DIAGNOSIS — E785 Hyperlipidemia, unspecified: Secondary | ICD-10-CM | POA: Diagnosis not present

## 2015-08-22 DIAGNOSIS — G629 Polyneuropathy, unspecified: Secondary | ICD-10-CM

## 2015-08-22 DIAGNOSIS — I10 Essential (primary) hypertension: Secondary | ICD-10-CM

## 2015-08-22 DIAGNOSIS — E119 Type 2 diabetes mellitus without complications: Secondary | ICD-10-CM | POA: Diagnosis not present

## 2015-08-22 LAB — POCT GLYCOSYLATED HEMOGLOBIN (HGB A1C): Hemoglobin A1C: 6.6

## 2015-08-22 MED ORDER — METFORMIN HCL 500 MG PO TABS: 1000.0000 mg | ORAL_TABLET | Freq: Two times a day (BID) | ORAL | Status: DC

## 2015-08-22 MED ORDER — HYDROCHLOROTHIAZIDE 25 MG PO TABS: 25.0000 mg | ORAL_TABLET | Freq: Every day | ORAL | Status: DC

## 2015-08-22 MED ORDER — GLIPIZIDE 5 MG PO TABS: ORAL_TABLET | ORAL | Status: DC

## 2015-08-22 MED ORDER — LOSARTAN POTASSIUM 50 MG PO TABS: ORAL_TABLET | ORAL | Status: DC

## 2015-08-22 MED ORDER — GLUCOSE BLOOD VI STRP: ORAL_STRIP | Status: DC

## 2015-08-22 NOTE — Progress Notes (Signed)
   Subjective:    Patient ID: Jack Mejia, male    DOB: [birth year], 70 y.o.   MRN: [id number]  Diabetes He presents for his follow-up diabetic visit. He has type 2 diabetes mellitus. There are no hypoglycemic associated symptoms. There are no diabetic associated symptoms. There are no hypoglycemic complications. There are no diabetic complications. There are no known risk factors for coronary artery disease. Current diabetic treatment includes oral agent (dual therapy). He is compliant with treatment all of the time.   Patient states that he has no concerns at this time.    Results for orders placed or performed in visit on 08/22/15  POCT glycosylated hemoglobin (Hb A1C)  Result Value Ref Range   Hemoglobin A1C 6.6    Vertigo acting up some.worse with certain motions. Spinning sensation with some nausea. Strikes fairly hard, prompted by certain movements  Compliant blood pressure medicine. No obvious side effects. Watching salt intake.medications reviewed today. Does not miss a dose. Review of Systems No chest pain no abdominal pain no change in bowel habits no weight gain no weight loss    Objective:   Physical Exam  Alert vitals stable neuro intact. HEENT normal. Cerebellar function normal lungs clear heart regular rhythm ankles without edema      Assessment & Plan:  Impression 1 type 2 diabetes good control discussed maintain same meds #2 hyperlipidemia prior numbers discussed #3 vertigo acute nature management discussed #4 hypertension good control. Meds reviewed maintain same plan diet exercise discussed meds refilled. Sensory neuropathy discussed 1 skin with importance of maintaining tight control follow-up scheduled WSL

## 2015-08-25 DIAGNOSIS — R69 Illness, unspecified: Secondary | ICD-10-CM | POA: Diagnosis not present

## 2015-09-26 ENCOUNTER — Telehealth: Payer: Self-pay | Admitting: Family Medicine

## 2015-09-26 MED ORDER — VARDENAFIL HCL 10 MG PO TABS: 10.0000 mg | ORAL_TABLET | Freq: Every day | ORAL | Status: DC | PRN

## 2015-09-26 NOTE — Telephone Encounter (Signed)
Notified patient that med was sent to pharmacy.

## 2015-09-26 NOTE — Telephone Encounter (Signed)
Ok 11 ref, ck paper if not in electronic

## 2015-09-26 NOTE — Telephone Encounter (Signed)
Pt is requesting a prescription for Levitra. Pt states that he was on it a while back and would like to get back on it.     [facility] [place]

## 2015-10-23 DIAGNOSIS — M79675 Pain in left toe(s): Secondary | ICD-10-CM | POA: Diagnosis not present

## 2015-10-23 DIAGNOSIS — M2042 Other hammer toe(s) (acquired), left foot: Secondary | ICD-10-CM | POA: Diagnosis not present

## 2016-02-11 ENCOUNTER — Telehealth: Payer: Self-pay | Admitting: Family Medicine

## 2016-02-11 DIAGNOSIS — E785 Hyperlipidemia, unspecified: Secondary | ICD-10-CM

## 2016-02-11 DIAGNOSIS — Z79899 Other long term (current) drug therapy: Secondary | ICD-10-CM

## 2016-02-11 DIAGNOSIS — Z125 Encounter for screening for malignant neoplasm of prostate: Secondary | ICD-10-CM

## 2016-02-11 DIAGNOSIS — E119 Type 2 diabetes mellitus without complications: Secondary | ICD-10-CM

## 2016-02-11 NOTE — Telephone Encounter (Signed)
Pt is requesting lab orders to be sent over for an upcoming wellness visit. Last labs per epic were: lipid,hepatic,bmp,microalbumin,psa and a1c on 02/07/15. Pt would like to get these done as soon as possible his appt is next thurs.

## 2016-02-11 NOTE — Telephone Encounter (Signed)
It would be fine to repeat all of these tests as done last year thank you-please order these through Dr. Richardson Landry- route these to Dr. Richardson Landry

## 2016-02-12 NOTE — Telephone Encounter (Signed)
Notified patient that bloodwork has been ordered.  

## 2016-02-14 DIAGNOSIS — Z79899 Other long term (current) drug therapy: Secondary | ICD-10-CM | POA: Diagnosis not present

## 2016-02-14 DIAGNOSIS — Z125 Encounter for screening for malignant neoplasm of prostate: Secondary | ICD-10-CM | POA: Diagnosis not present

## 2016-02-14 DIAGNOSIS — E119 Type 2 diabetes mellitus without complications: Secondary | ICD-10-CM | POA: Diagnosis not present

## 2016-02-14 DIAGNOSIS — E785 Hyperlipidemia, unspecified: Secondary | ICD-10-CM | POA: Diagnosis not present

## 2016-02-15 LAB — BASIC METABOLIC PANEL
BUN/Creatinine Ratio: 15 (ref 10–24)
BUN: 19 mg/dL (ref 8–27)
CO2: 24 mmol/L (ref 18–29)
Calcium: 9.5 mg/dL (ref 8.6–10.2)
Chloride: 99 mmol/L (ref 96–106)
Creatinine, Ser: 1.29 mg/dL — ABNORMAL HIGH (ref 0.76–1.27)
GFR calc Af Amer: 65 mL/min/{1.73_m2} (ref >59)
GFR calc non Af Amer: 56 mL/min/{1.73_m2} — ABNORMAL LOW (ref >59)
Glucose: 154 mg/dL — ABNORMAL HIGH (ref 65–99)
Potassium: 4.3 mmol/L (ref 3.5–5.2)
Sodium: 139 mmol/L (ref 134–144)

## 2016-02-15 LAB — HEPATIC FUNCTION PANEL
ALT: 28 IU/L (ref 0–44)
AST: 23 IU/L (ref 0–40)
Albumin: 4.5 g/dL (ref 3.6–4.8)
Alkaline Phosphatase: 64 IU/L (ref 39–117)
Bilirubin Total: 0.7 mg/dL (ref 0.0–1.2)
Bilirubin, Direct: 0.22 mg/dL (ref 0.00–0.40)
Total Protein: 7 g/dL (ref 6.0–8.5)

## 2016-02-15 LAB — LIPID PANEL
Chol/HDL Ratio: 4 ratio units (ref 0.0–5.0)
Cholesterol, Total: 148 mg/dL (ref 100–199)
HDL: 37 mg/dL — ABNORMAL LOW (ref >39)
LDL Calculated: 85 mg/dL (ref 0–99)
Triglycerides: 129 mg/dL (ref 0–149)
VLDL Cholesterol Cal: 26 mg/dL (ref 5–40)

## 2016-02-15 LAB — MICROALBUMIN / CREATININE URINE RATIO
Creatinine, Urine: 131.8 mg/dL
MICROALB/CREAT RATIO: 127.8 mg/g creat — ABNORMAL HIGH (ref 0.0–30.0)
Microalbumin, Urine: 168.4 ug/mL

## 2016-02-15 LAB — HEMOGLOBIN A1C
Est. average glucose Bld gHb Est-mCnc: 154 mg/dL
Hgb A1c MFr Bld: 7 % — ABNORMAL HIGH (ref 4.8–5.6)

## 2016-02-15 LAB — PSA: Prostate Specific Ag, Serum: 0.6 ng/mL (ref 0.0–4.0)

## 2016-02-21 ENCOUNTER — Telehealth: Payer: Self-pay | Admitting: Family Medicine

## 2016-02-21 ENCOUNTER — Ambulatory Visit (INDEPENDENT_AMBULATORY_CARE_PROVIDER_SITE_OTHER): Payer: Medicare HMO | Admitting: Family Medicine

## 2016-02-21 ENCOUNTER — Encounter: Payer: Self-pay | Admitting: Family Medicine

## 2016-02-21 VITALS — BP 130/76 | Ht 70.0 in | Wt 218.8 lb

## 2016-02-21 DIAGNOSIS — I10 Essential (primary) hypertension: Secondary | ICD-10-CM

## 2016-02-21 DIAGNOSIS — Z0000 Encounter for general adult medical examination without abnormal findings: Secondary | ICD-10-CM | POA: Diagnosis not present

## 2016-02-21 DIAGNOSIS — E0800 Diabetes mellitus due to underlying condition with hyperosmolarity without nonketotic hyperglycemic-hyperosmolar coma (NKHHC): Secondary | ICD-10-CM | POA: Diagnosis not present

## 2016-02-21 DIAGNOSIS — E119 Type 2 diabetes mellitus without complications: Secondary | ICD-10-CM

## 2016-02-21 MED ORDER — HYDROCHLOROTHIAZIDE 25 MG PO TABS: 25.0000 mg | ORAL_TABLET | Freq: Every day | ORAL | 1 refills | Status: DC

## 2016-02-21 MED ORDER — METFORMIN HCL 500 MG PO TABS: 1000.0000 mg | ORAL_TABLET | Freq: Two times a day (BID) | ORAL | 1 refills | Status: DC

## 2016-02-21 MED ORDER — GLIPIZIDE 5 MG PO TABS: ORAL_TABLET | ORAL | 1 refills | Status: DC

## 2016-02-21 MED ORDER — LOSARTAN POTASSIUM 50 MG PO TABS: ORAL_TABLET | ORAL | 5 refills | Status: DC

## 2016-02-21 NOTE — Telephone Encounter (Signed)
Left message informing patient that medication was sent to the correct pharmacy.

## 2016-02-21 NOTE — Telephone Encounter (Signed)
Pt needs to get all his current meds sent to [facility]  [facility]

## 2016-02-21 NOTE — Progress Notes (Signed)
Subjective:    Patient ID: Jack Mejia, male    DOB: 10/29/1945, 70 y.o.   MRN: [id number]  HPI  The patient comes in today for a wellness visit.  Results for orders placed or performed in visit on 02/11/16  Lipid panel  Result Value Ref Range   Cholesterol, Total 148 100 - 199 mg/dL   Triglycerides 129 0 - 149 mg/dL   HDL 37 (L) >39 mg/dL   VLDL Cholesterol Cal 26 5 - 40 mg/dL   LDL Calculated 85 0 - 99 mg/dL   Chol/HDL Ratio 4.0 0.0 - 5.0 ratio units  Hepatic function panel  Result Value Ref Range   Total Protein 7.0 6.0 - 8.5 g/dL   Albumin 4.5 3.6 - 4.8 g/dL   Bilirubin Total 0.7 0.0 - 1.2 mg/dL   Bilirubin, Direct 0.22 0.00 - 0.40 mg/dL   Alkaline Phosphatase 64 39 - 117 IU/L   AST 23 0 - 40 IU/L   ALT 28 0 - 44 IU/L  Basic metabolic panel  Result Value Ref Range   Glucose 154 (H) 65 - 99 mg/dL   BUN 19 8 - 27 mg/dL   Creatinine, Ser 1.29 (H) 0.76 - 1.27 mg/dL   GFR calc non Af Amer 56 (L) >59 mL/min/1.73   GFR calc Af Amer 65 >59 mL/min/1.73   BUN/Creatinine Ratio 15 10 - 24   Sodium 139 134 - 144 mmol/L   Potassium 4.3 3.5 - 5.2 mmol/L   Chloride 99 96 - 106 mmol/L   CO2 24 18 - 29 mmol/L   Calcium 9.5 8.6 - 10.2 mg/dL  Microalbumin / creatinine urine ratio  Result Value Ref Range   Creatinine, Urine 131.8 Not Estab. mg/dL   Microalbum.,U,Random 168.4 Not Estab. ug/mL   MICROALB/CREAT RATIO 127.8 (H) 0.0 - 30.0 mg/g creat  PSA  Result Value Ref Range   Prostate Specific Ag, Serum 0.6 0.0 - 4.0 ng/mL  Hemoglobin A1c  Result Value Ref Range   Hgb A1c MFr Bld 7.0 (H) 4.8 - 5.6 %   Est. average glucose Bld gHb Est-mCnc 154 mg/dL   Sugars running about  A review of their health history was completed.  A review of medications was also completed.  Any needed refills; yes  Eating habits: eating healthy  Falls/  MVA accidents in past few months: none  Regular exercise: try to do some but not regular  Specialist pt sees on regular basis:  no  Preventative health issues were discussed.   Additional concerns: discuss recent bloodwork and diabetes and patient concerned about marble like areas in private area  Patient claims compliance with diabetes medication. No obvious side effects. Reports no substantial low sugar spells. Most numbers are generally in good range when checked fasting. Generally does not miss a dose of medication. Watching diabetic diet closely   Review of Systems  Constitutional: Negative for activity change, appetite change and fever.  HENT: Negative for congestion and rhinorrhea.   Eyes: Negative for discharge.  Respiratory: Negative for cough and wheezing.   Cardiovascular: Negative for chest pain.  Gastrointestinal: Negative for abdominal pain, blood in stool and vomiting.  Genitourinary: Negative for difficulty urinating and frequency.  Musculoskeletal: Negative for neck pain.  Skin: Negative for rash.  Allergic/Immunologic: Negative for environmental allergies and food allergies.  Neurological: Negative for weakness and headaches.  Psychiatric/Behavioral: Negative for agitation.  All other systems reviewed and are negative.      Objective:   Physical Exam  Constitutional: He appears well-developed and well-nourished.  HENT:  Head: Normocephalic and atraumatic.  Right Ear: External ear normal.  Left Ear: External ear normal.  Nose: Nose normal.  Mouth/Throat: Oropharynx is clear and moist.  Eyes: EOM are normal. Pupils are equal, round, and reactive to light.  Neck: Normal range of motion. Neck supple. No thyromegaly present.  Cardiovascular: Normal rate, regular rhythm and normal heart sounds.   No murmur heard. Pulmonary/Chest: Effort normal and breath sounds normal. No respiratory distress. He has no wheezes.  Abdominal: Soft. Bowel sounds are normal. He exhibits no distension and no mass. There is no tenderness.  Genitourinary: Penis normal.  Musculoskeletal: Normal range of motion.  He exhibits no edema.  Lymphadenopathy:    He has no cervical adenopathy.  Neurological: He is alert. He exhibits normal muscle tone.  Skin: Skin is warm and dry. No erythema.  Psychiatric: He has a normal mood and affect. His behavior is normal. Judgment normal.  Vitals reviewed. Feet sensation good pulses good no significant edema        Assessment & Plan:  Impression well.exam diet exercise discussed anticipatory guidance discussed. Patient is to his colonoscopy. Encouraged to get back with his GI physician. #2 type 2 diabetes good control discussed maintain same meds #3 hypertension good control discussed maintain same plan medications refilled diet exercise discussed patient call for colonoscopy WSL

## 2016-03-04 ENCOUNTER — Telehealth: Payer: Self-pay | Admitting: Family Medicine

## 2016-03-04 DIAGNOSIS — N529 Male erectile dysfunction, unspecified: Secondary | ICD-10-CM

## 2016-03-04 NOTE — Telephone Encounter (Signed)
Pt wanted referral for erectile dysfunction. Referral put in for urology

## 2016-03-04 NOTE — Telephone Encounter (Signed)
It is fine to go ahead and refer him to neurology in [place] discuss with patient try to figure out the reason why and put that in with the consultation. Dr. Richardson Landry out of office

## 2016-03-04 NOTE — Telephone Encounter (Signed)
Pt was told he would be sent to see a neurologist, I do not see this referral in the system

## 2016-03-04 NOTE — Telephone Encounter (Signed)
Left message to return call 

## 2016-03-09 ENCOUNTER — Encounter: Payer: Self-pay | Admitting: Family Medicine

## 2016-03-18 ENCOUNTER — Encounter: Payer: Self-pay | Admitting: Family Medicine

## 2016-03-31 ENCOUNTER — Telehealth: Payer: Self-pay | Admitting: Family Medicine

## 2016-03-31 NOTE — Telephone Encounter (Signed)
Patient says the last time he was seen in the office, he discussed having leg pain with Dr. Richardson Landry.  He was told it was a part of him getting older.  He said he has been talking with some people about this and they suggested Lyrica.  He is wanting to know if we can give him an Rx for this to try[facility]  [facility]

## 2016-03-31 NOTE — Telephone Encounter (Signed)
Discussed with pt. Pt transferred to front to schedule office visit for leg pain.

## 2016-03-31 NOTE — Telephone Encounter (Signed)
I gernerally do not tell pt s there troubles are because they afe "getting older", also, insur co will definitely not cover lyrica for "getting older" pain, needs o v first before considereing this costly medicine

## 2016-04-01 ENCOUNTER — Encounter: Payer: Self-pay | Admitting: Family Medicine

## 2016-04-01 ENCOUNTER — Ambulatory Visit (INDEPENDENT_AMBULATORY_CARE_PROVIDER_SITE_OTHER): Payer: Medicare HMO | Admitting: Family Medicine

## 2016-04-01 VITALS — BP 148/68 | Temp 97.8°F | Ht 70.0 in | Wt 221.0 lb

## 2016-04-01 DIAGNOSIS — R252 Cramp and spasm: Secondary | ICD-10-CM

## 2016-04-01 DIAGNOSIS — S7402XA Injury of sciatic nerve at hip and thigh level, left leg, initial encounter: Secondary | ICD-10-CM

## 2016-04-01 MED ORDER — CYCLOBENZAPRINE HCL 10 MG PO TABS: 10.0000 mg | ORAL_TABLET | Freq: Every day | ORAL | 5 refills | Status: DC

## 2016-04-01 MED ORDER — GABAPENTIN 100 MG PO CAPS: 100.0000 mg | ORAL_CAPSULE | Freq: Three times a day (TID) | ORAL | 5 refills | Status: DC

## 2016-04-01 NOTE — Progress Notes (Signed)
   Subjective:    Patient ID: Jack Mejia, male    DOB: [birth year], 70 y.o.   MRN: [name]  Leg Pain   The incident occurred more than 1 week ago. The pain is present in the left leg, right leg, right hip, left hip, right knee and left knee. The quality of the pain is described as cramping and aching.  On further history patient's had this pain off and on for long time. Worse lately. Extends from the low back all the way out towards the lower calf. Describes as a deep aching at times. Worse on the left side. Then the right.  Was talking with a friend who advised to try Lyrica. Patient would like to try this until he heard the cost. He would like to try an alternative.  Patient also notes severe spasms at 10 to occur at nighttime. Would like something for this. Recent blood work showed potassium was good Patient states no other concerns this visit.   Left hip and leg and calf pain the worse  No numbness or tingling in the feet  No sig walking or exrcise  neurontin  Review of Systems No headache, no major weight loss or weight gain, no chest pain no back pain abdominal pain no change in bowel habits complete ROS otherwise negative     Objective:   Physical Exam  Alert vitals stable, NAD. Blood pressure good on repeat. HEENT normal. Lungs clear. Heart regular rate and rhythm. Left leg plus minus straight leg raise distal pulses sensation all intact strength appears intact no spinal tenderness question sciatic notch tenderness      Assessment & Plan:  Impression chronic leg pain with potential neuropathic left sciatica element plan trial of Neurontin 3 times a day. Also add Flexeril daily at bedtime for muscle cramps. Patient advised been don't have a lot of good choices medication wise for this WSL easily 25 minutes spent teasing the history out of this well-meaning but sometimes difficult to delineate concern patient

## 2016-04-02 ENCOUNTER — Telehealth: Payer: Self-pay | Admitting: *Deleted

## 2016-04-02 NOTE — Telephone Encounter (Signed)
Pt seen yesterday. Prescribed cyclobenzaprine. Need to do PA on med. Can you finish note so I can process PA or do you want to change med.

## 2016-04-05 NOTE — Telephone Encounter (Signed)
Change to zanaflex 4 mg one qhs prn for cramps , 30, 3 ref

## 2016-04-06 ENCOUNTER — Other Ambulatory Visit: Payer: Self-pay

## 2016-04-06 MED ORDER — TIZANIDINE HCL 4 MG PO TABS: 4.0000 mg | ORAL_TABLET | Freq: Every evening | ORAL | 3 refills | Status: DC | PRN

## 2016-04-06 NOTE — Telephone Encounter (Signed)
Left message on voicemail notifying patient

## 2016-04-06 NOTE — Telephone Encounter (Signed)
Med changed and sent to pharmacy.

## 2016-04-28 DIAGNOSIS — E291 Testicular hypofunction: Secondary | ICD-10-CM | POA: Diagnosis not present

## 2016-04-28 DIAGNOSIS — N5201 Erectile dysfunction due to arterial insufficiency: Secondary | ICD-10-CM | POA: Diagnosis not present

## 2016-05-18 DIAGNOSIS — M5410 Radiculopathy, site unspecified: Secondary | ICD-10-CM | POA: Diagnosis not present

## 2016-05-18 DIAGNOSIS — M25552 Pain in left hip: Secondary | ICD-10-CM | POA: Diagnosis not present

## 2016-06-02 ENCOUNTER — Telehealth: Payer: Self-pay | Admitting: Family Medicine

## 2016-06-02 DIAGNOSIS — M79606 Pain in leg, unspecified: Secondary | ICD-10-CM

## 2016-06-02 NOTE — Telephone Encounter (Signed)
Pt is requesting a referral to Dr. Case in [place]. Pt is still having problems with his legs. Please advise.

## 2016-06-02 NOTE — Telephone Encounter (Signed)
Dr Case is an orthopedic doctor in [place]

## 2016-06-02 NOTE — Telephone Encounter (Signed)
Ok, lets do for leg pain

## 2016-06-02 NOTE — Telephone Encounter (Signed)
Left message return on 06/02/16 ( referral entered in epic)

## 2016-06-02 NOTE — Telephone Encounter (Signed)
What kine of dr is dr case? Pt has sciatica and neuropathic pain primarily

## 2016-06-05 ENCOUNTER — Telehealth: Payer: Self-pay | Admitting: Family Medicine

## 2016-06-05 NOTE — Telephone Encounter (Signed)
Left message on voicemail to return call.

## 2016-06-05 NOTE — Telephone Encounter (Signed)
Called to refer patient to Dr. Case (ortho in [place]) He does not treat any pain related to sciatica  If pt is having hip or knee issues they will be happy to see him They do not want to waste his specialty copay or an appointment slot if this is sciatica related  NTBS here for further workup??    Please advise

## 2016-06-05 NOTE — Telephone Encounter (Signed)
Tell pt this, o v next wk

## 2016-06-11 NOTE — Telephone Encounter (Signed)
Spoke with patient and informed him per Dr.Steve Luking- Would like office visit for further work up before sending to Ortho due to [facility] not seeing sciatica cases. Patient verbalized understanding.

## 2016-06-15 ENCOUNTER — Ambulatory Visit (INDEPENDENT_AMBULATORY_CARE_PROVIDER_SITE_OTHER): Payer: Medicare HMO | Admitting: Family Medicine

## 2016-06-15 ENCOUNTER — Encounter: Payer: Self-pay | Admitting: Family Medicine

## 2016-06-15 VITALS — BP 154/82 | Ht 70.0 in | Wt 225.0 lb

## 2016-06-15 DIAGNOSIS — M5432 Sciatica, left side: Secondary | ICD-10-CM

## 2016-06-15 DIAGNOSIS — M5430 Sciatica, unspecified side: Secondary | ICD-10-CM

## 2016-06-15 NOTE — Progress Notes (Signed)
   Subjective:    Patient ID: Jack Mejia, male    DOB: 1945-09-30, 70 y.o.   MRN: [id number]  Leg Pain   Incident onset: 6 months. Pain location: bilateral leg. Treatments tried: gabapentin, zanaflex. The treatment provided no relief.  pain comes and goes, all the time  Left leg more painful  Pain is severe, tooth achey  Rad from low bk to foot  Feels weak Patient went to see orthopedic surgeon in [place]. Had knee x-ray hip x-ray. Was advised that his pain was likely neurological in etiology. They recommended an MRI. Patient is not exactly sure why this wasn't done.  Reports ongoing pain. Deep into the left thigh and left calf radiating down to the foot. At times leg feels weak. Some low back pain with this. Progressive over number of months. Unresponsive to neuropathic medication. And unresponsive to time.  Declines flu vaccine.   Review of Systems No headache, no major weight loss or weight gain, no chest pain no back pain abdominal pain no change in bowel habits complete ROS otherwise negative     Objective:   Physical Exam Alert vitals stable, NAD. Blood pressure good on repeat. HEENT normal. Lungs clear. Heart regular rate and rhythm. Left leg positive straight leg raise noted left sciatic notch tenderness. Question diminished ankle reflexes on left left great toe appears somewhat weak compared to the right, sensation overall present       Assessment & Plan:  Impression progressive sciatica now with weakness and substantial pain. Needs an MRI. Discussed. 25 minutes spent most in discussion

## 2016-06-23 ENCOUNTER — Telehealth: Payer: Self-pay | Admitting: Family Medicine

## 2016-06-23 NOTE — Telephone Encounter (Signed)
Patient MRI of the Lumbar spine w/o contrast was approved by  [employer]  from dates 06/22/16/-09/20/2016. Authorization number is [id number].

## 2016-06-24 ENCOUNTER — Other Ambulatory Visit: Payer: Self-pay | Admitting: *Deleted

## 2016-06-24 ENCOUNTER — Ambulatory Visit (HOSPITAL_COMMUNITY)
Admission: RE | Admit: 2016-06-24 | Discharge: 2016-06-24 | Disposition: A | Discharge status: Home / Self Care | Payer: Medicare HMO | Source: Ambulatory Visit | Attending: Family Medicine | Admitting: Family Medicine

## 2016-06-24 DIAGNOSIS — M5116 Intervertebral disc disorders with radiculopathy, lumbar region: Secondary | ICD-10-CM | POA: Diagnosis not present

## 2016-06-24 DIAGNOSIS — M545 Low back pain: Secondary | ICD-10-CM | POA: Diagnosis not present

## 2016-06-24 DIAGNOSIS — M48061 Spinal stenosis, lumbar region without neurogenic claudication: Secondary | ICD-10-CM | POA: Insufficient documentation

## 2016-06-24 DIAGNOSIS — M5126 Other intervertebral disc displacement, lumbar region: Secondary | ICD-10-CM

## 2016-06-24 DIAGNOSIS — M5136 Other intervertebral disc degeneration, lumbar region: Secondary | ICD-10-CM

## 2016-06-24 DIAGNOSIS — M4800 Spinal stenosis, site unspecified: Secondary | ICD-10-CM

## 2016-06-24 DIAGNOSIS — M5430 Sciatica, unspecified side: Secondary | ICD-10-CM | POA: Diagnosis present

## 2016-06-26 ENCOUNTER — Encounter: Payer: Self-pay | Admitting: Family Medicine

## 2016-07-13 ENCOUNTER — Other Ambulatory Visit: Payer: Self-pay | Admitting: Family Medicine

## 2016-07-28 ENCOUNTER — Other Ambulatory Visit: Payer: Self-pay | Admitting: Neurosurgery

## 2016-07-28 DIAGNOSIS — I10 Essential (primary) hypertension: Secondary | ICD-10-CM | POA: Diagnosis not present

## 2016-07-28 DIAGNOSIS — M5136 Other intervertebral disc degeneration, lumbar region: Secondary | ICD-10-CM | POA: Diagnosis not present

## 2016-07-28 DIAGNOSIS — M48062 Spinal stenosis, lumbar region with neurogenic claudication: Secondary | ICD-10-CM | POA: Diagnosis not present

## 2016-07-28 DIAGNOSIS — M4726 Other spondylosis with radiculopathy, lumbar region: Secondary | ICD-10-CM | POA: Diagnosis not present

## 2016-07-28 DIAGNOSIS — M546 Pain in thoracic spine: Secondary | ICD-10-CM | POA: Diagnosis not present

## 2016-07-28 DIAGNOSIS — M549 Dorsalgia, unspecified: Secondary | ICD-10-CM | POA: Diagnosis not present

## 2016-07-28 DIAGNOSIS — Z6832 Body mass index (BMI) 32.0-32.9, adult: Secondary | ICD-10-CM | POA: Diagnosis not present

## 2016-08-03 ENCOUNTER — Encounter (HOSPITAL_COMMUNITY)
Admission: RE | Admit: 2016-08-03 | Discharge: 2016-08-03 | Disposition: A | Discharge status: Home / Self Care | Payer: MEDICARE | Source: Ambulatory Visit | Attending: Neurosurgery | Admitting: Neurosurgery

## 2016-08-03 ENCOUNTER — Encounter (HOSPITAL_COMMUNITY): Payer: Self-pay

## 2016-08-03 DIAGNOSIS — E119 Type 2 diabetes mellitus without complications: Secondary | ICD-10-CM

## 2016-08-03 DIAGNOSIS — M47816 Spondylosis without myelopathy or radiculopathy, lumbar region: Secondary | ICD-10-CM | POA: Diagnosis present

## 2016-08-03 DIAGNOSIS — Z885 Allergy status to narcotic agent status: Secondary | ICD-10-CM | POA: Diagnosis not present

## 2016-08-03 DIAGNOSIS — Z833 Family history of diabetes mellitus: Secondary | ICD-10-CM

## 2016-08-03 DIAGNOSIS — Z87891 Personal history of nicotine dependence: Secondary | ICD-10-CM | POA: Diagnosis not present

## 2016-08-03 DIAGNOSIS — Z79899 Other long term (current) drug therapy: Secondary | ICD-10-CM

## 2016-08-03 DIAGNOSIS — Z7984 Long term (current) use of oral hypoglycemic drugs: Secondary | ICD-10-CM | POA: Diagnosis not present

## 2016-08-03 DIAGNOSIS — Z01818 Encounter for other preprocedural examination: Secondary | ICD-10-CM

## 2016-08-03 DIAGNOSIS — M48062 Spinal stenosis, lumbar region with neurogenic claudication: Secondary | ICD-10-CM | POA: Diagnosis not present

## 2016-08-03 DIAGNOSIS — Z888 Allergy status to other drugs, medicaments and biological substances status: Secondary | ICD-10-CM | POA: Diagnosis not present

## 2016-08-03 DIAGNOSIS — I10 Essential (primary) hypertension: Secondary | ICD-10-CM | POA: Diagnosis not present

## 2016-08-03 DIAGNOSIS — Z01812 Encounter for preprocedural laboratory examination: Secondary | ICD-10-CM

## 2016-08-03 DIAGNOSIS — K760 Fatty (change of) liver, not elsewhere classified: Secondary | ICD-10-CM

## 2016-08-03 DIAGNOSIS — M5136 Other intervertebral disc degeneration, lumbar region: Secondary | ICD-10-CM | POA: Diagnosis present

## 2016-08-03 HISTORY — DX: Personal history of urinary calculi: Z87.442

## 2016-08-03 HISTORY — DX: Dyspnea, unspecified: R06.00

## 2016-08-03 HISTORY — DX: Cramp and spasm: R25.2

## 2016-08-03 LAB — CBC
HCT: 37.6 % — ABNORMAL LOW (ref 39.0–52.0)
Hemoglobin: 13.3 g/dL (ref 13.0–17.0)
MCH: 34.5 pg — ABNORMAL HIGH (ref 26.0–34.0)
MCHC: 35.4 g/dL (ref 30.0–36.0)
MCV: 97.7 fL (ref 78.0–100.0)
Platelets: 159 10*3/uL (ref 150–400)
RBC: 3.85 MIL/uL — ABNORMAL LOW (ref 4.22–5.81)
RDW: 13.2 % (ref 11.5–15.5)
WBC: 7.5 10*3/uL (ref 4.0–10.5)

## 2016-08-03 LAB — BASIC METABOLIC PANEL
Anion gap: 10 (ref 5–15)
BUN: 18 mg/dL (ref 6–20)
CO2: 23 mmol/L (ref 22–32)
Calcium: 9.5 mg/dL (ref 8.9–10.3)
Chloride: 104 mmol/L (ref 101–111)
Creatinine, Ser: 1.4 mg/dL — ABNORMAL HIGH (ref 0.61–1.24)
GFR calc Af Amer: 57 mL/min — ABNORMAL LOW (ref >60)
GFR calc non Af Amer: 49 mL/min — ABNORMAL LOW (ref >60)
Glucose, Bld: 214 mg/dL — ABNORMAL HIGH (ref 65–99)
Potassium: 3.9 mmol/L (ref 3.5–5.1)
Sodium: 137 mmol/L (ref 135–145)

## 2016-08-03 LAB — SURGICAL PCR SCREEN
MRSA, PCR: NEGATIVE
Staphylococcus aureus: NEGATIVE

## 2016-08-03 LAB — GLUCOSE, CAPILLARY: Glucose-Capillary: 182 mg/dL — ABNORMAL HIGH (ref 65–99)

## 2016-08-03 MED ORDER — CHLORHEXIDINE GLUCONATE CLOTH 2 % EX PADS: 6.0000 | MEDICATED_PAD | Freq: Once | CUTANEOUS | Status: DC

## 2016-08-03 NOTE — Pre-Procedure Instructions (Signed)
    Jack Mejia  08/03/2016      [id number] [place] [place] - [address] [place] [address] Phone: [phone] Fax: [phone]  [place], [place] - [address] [place] [address] [place] [address] Phone: [phone] Fax: [phone]    Your procedure is scheduled on --08/06/16.  Report to [facility] Admitting at 530 A.M.  Call this number if you have problems the morning of surgery:  [phone]   Remember:  Do not eat food or drink liquids after midnight.  Take these medicines the morning of surgery with A SIP OF WATER --neurontin   Do not wear jewelry, make-up or nail polish.  Do not wear lotions, powders, or perfumes, or deoderant.  Do not shave 48 hours prior to surgery.  Men may shave face and neck.  Do not bring valuables to the hospital.  [facility] is not responsible for any belongings or valuables.  Contacts, dentures or bridgework may not be worn into surgery.  Leave your suitcase in the car.  After surgery it may be brought to your room.  For patients admitted to the hospital, discharge time will be determined by your treatment team.  Patients discharged the day of surgery will not be allowed to drive home.   Name and phone number of your driver:  Special instructions:  Do not take any aspirin,anti-inflammatories,vitamins,or herbal supplements 5-7 days prior to surgery.  Please read over the following fact sheets that you were given. MRSA Information

## 2016-08-04 LAB — HEMOGLOBIN A1C
Hgb A1c MFr Bld: 8 % — ABNORMAL HIGH (ref 4.8–5.6)
Mean Plasma Glucose: 183 mg/dL

## 2016-08-04 NOTE — Progress Notes (Signed)
Anesthesia Chart Review: Patient is a 71 year old male scheduled for L3-5 decompressive laminectomy on 08/06/2016 by Dr. Sherwood Gambler.  History includes former smoker (quit '84), HTN, DM2, depression, fatty liver (with history of elevated LFTs), vertigo, dyspnea, nephrolithiasis, LLE cramps, cholecystectomy '03, colon surgery (not specified), left knee arthroscopy/partial medial meniscectomy '07. BMI is consistent with obesity.   PCP is Dr. Grace Bushy. Luking, last visit 06/15/16.  Meds include gabapentin, glipizide, HCTZ, losartan, metformin.  BP (!) 171/79   Pulse 90   Temp 36.7 C   Resp 20   Ht '5\' 10"'$  (1.778 m)   Wt 225 lb (102.1 kg)   SpO2 98%   BMI 32.28 kg/m   EKG 08/03/2016: Normal sinus rhythm, ST and T-wave abnormality, consider lateral ischemia. The interpreting cardiologist did not think there was any significant change when compared to his 06/19/14 tracing (that showed flat lateral T waves).   Echo 07/10/02:  RESULTS:  1. Technically difficult and limited echocardiographic study.  2. Very mild left atrial enlargement, normal right ventricle size and     function.  3. No abnormalities of aortic, mitral, tricuspid nor pulmonic valves     identified.  4. Normal internal dimension of the left ventricle; mild to moderate     hypertrophy with disproportionate involvement of the proximal septum.     Normal regional and global LV systolic function.  Preoperative labs noted. Cr 1.40, previously 1.2-1.3 05/2014-01/2016. LFTs not done with PAT labs, but AST/ALT, ALK PHOS, and total bilirubin have been essentially normal 05/2014-01/2016. H/H 13.3/37.6. A1c 8.0.   I was not asked to evaluate patient at PAT and no phone number for patient is listed in [place]. No chest pain symptoms documented for PAT or recent PCP visits. No reported CAD/MI/CHF history, but he does have HTN and DM. He quit smoking many years ago. His EKG was felt stable since 2015. Overall labs acceptable for OR, although A1c  is up to 8.0. He will get a fasting CBG on arrival. Discussed with anesthesiologist Dr. Orene Desanctis. If no acute changes or new CV symptoms then I would anticipate that he could proceed as planned. He will be further evaluated by his anesthesiolgoist on the day of surgery.   George Hugh [facility] Short Stay Center/Anesthesiology Phone [phone] 08/04/2016 5:22 PM

## 2016-08-05 DIAGNOSIS — M5136 Other intervertebral disc degeneration, lumbar region: Secondary | ICD-10-CM | POA: Diagnosis not present

## 2016-08-05 DIAGNOSIS — M4726 Other spondylosis with radiculopathy, lumbar region: Secondary | ICD-10-CM | POA: Diagnosis not present

## 2016-08-05 DIAGNOSIS — M546 Pain in thoracic spine: Secondary | ICD-10-CM | POA: Diagnosis not present

## 2016-08-05 DIAGNOSIS — M48062 Spinal stenosis, lumbar region with neurogenic claudication: Secondary | ICD-10-CM | POA: Diagnosis not present

## 2016-08-05 DIAGNOSIS — M549 Dorsalgia, unspecified: Secondary | ICD-10-CM | POA: Diagnosis not present

## 2016-08-06 ENCOUNTER — Inpatient Hospital Stay (HOSPITAL_COMMUNITY)
Admission: AD | Admit: 2016-08-06 | Discharge: 2016-08-07 | DRG: 517 | Disposition: A | Discharge status: Home / Self Care | Payer: MEDICARE | Source: Ambulatory Visit | Attending: Neurosurgery | Admitting: Neurosurgery

## 2016-08-06 ENCOUNTER — Ambulatory Visit (HOSPITAL_COMMUNITY): Payer: MEDICARE | Admitting: Anesthesiology

## 2016-08-06 ENCOUNTER — Encounter (HOSPITAL_COMMUNITY): Payer: Self-pay | Admitting: Urology

## 2016-08-06 ENCOUNTER — Ambulatory Visit (HOSPITAL_COMMUNITY): Payer: MEDICARE | Admitting: Vascular Surgery

## 2016-08-06 ENCOUNTER — Ambulatory Visit (HOSPITAL_COMMUNITY): Payer: MEDICARE

## 2016-08-06 ENCOUNTER — Encounter (HOSPITAL_COMMUNITY)
Admission: AD | Disposition: A | Discharge status: Home / Self Care | Payer: Self-pay | Source: Ambulatory Visit | Attending: Neurosurgery

## 2016-08-06 DIAGNOSIS — E119 Type 2 diabetes mellitus without complications: Secondary | ICD-10-CM | POA: Diagnosis not present

## 2016-08-06 DIAGNOSIS — I10 Essential (primary) hypertension: Secondary | ICD-10-CM | POA: Diagnosis not present

## 2016-08-06 DIAGNOSIS — E785 Hyperlipidemia, unspecified: Secondary | ICD-10-CM | POA: Diagnosis not present

## 2016-08-06 DIAGNOSIS — Z87891 Personal history of nicotine dependence: Secondary | ICD-10-CM | POA: Diagnosis not present

## 2016-08-06 DIAGNOSIS — Z888 Allergy status to other drugs, medicaments and biological substances status: Secondary | ICD-10-CM | POA: Diagnosis not present

## 2016-08-06 DIAGNOSIS — Z7984 Long term (current) use of oral hypoglycemic drugs: Secondary | ICD-10-CM | POA: Diagnosis not present

## 2016-08-06 DIAGNOSIS — M48061 Spinal stenosis, lumbar region without neurogenic claudication: Secondary | ICD-10-CM | POA: Diagnosis not present

## 2016-08-06 DIAGNOSIS — M5136 Other intervertebral disc degeneration, lumbar region: Secondary | ICD-10-CM | POA: Diagnosis not present

## 2016-08-06 DIAGNOSIS — Z981 Arthrodesis status: Secondary | ICD-10-CM | POA: Diagnosis not present

## 2016-08-06 DIAGNOSIS — Z833 Family history of diabetes mellitus: Secondary | ICD-10-CM | POA: Diagnosis not present

## 2016-08-06 DIAGNOSIS — Z885 Allergy status to narcotic agent status: Secondary | ICD-10-CM | POA: Diagnosis not present

## 2016-08-06 DIAGNOSIS — M48062 Spinal stenosis, lumbar region with neurogenic claudication: Secondary | ICD-10-CM | POA: Diagnosis present

## 2016-08-06 DIAGNOSIS — M47816 Spondylosis without myelopathy or radiculopathy, lumbar region: Secondary | ICD-10-CM | POA: Diagnosis not present

## 2016-08-06 DIAGNOSIS — Z09 Encounter for follow-up examination after completed treatment for conditions other than malignant neoplasm: Secondary | ICD-10-CM

## 2016-08-06 HISTORY — PX: LUMBAR LAMINECTOMY/DECOMPRESSION MICRODISCECTOMY: SHX5026

## 2016-08-06 LAB — GLUCOSE, CAPILLARY
Glucose-Capillary: 173 mg/dL — ABNORMAL HIGH (ref 65–99)
Glucose-Capillary: 189 mg/dL — ABNORMAL HIGH (ref 65–99)
Glucose-Capillary: 172 mg/dL — ABNORMAL HIGH (ref 65–99)
Glucose-Capillary: 164 mg/dL — ABNORMAL HIGH (ref 65–99)
Glucose-Capillary: 185 mg/dL — ABNORMAL HIGH (ref 65–99)

## 2016-08-06 SURGERY — LUMBAR LAMINECTOMY/DECOMPRESSION MICRODISCECTOMY 2 LEVELS
Anesthesia: General

## 2016-08-06 MED ORDER — CYCLOBENZAPRINE HCL 5 MG PO TABS
5.0000 mg | ORAL_TABLET | Freq: Three times a day (TID) | ORAL | Status: DC | PRN
  Administered 2016-08-06: 10 mg via ORAL
  Filled 2016-08-06: qty 2

## 2016-08-06 MED ORDER — LIDOCAINE HCL (CARDIAC) 20 MG/ML IV SOLN
INTRAVENOUS | Status: DC | PRN
  Administered 2016-08-06: 60 mg via INTRAVENOUS

## 2016-08-06 MED ORDER — LACTATED RINGERS IV SOLN
INTRAVENOUS | Status: DC | PRN
  Administered 2016-08-06 (×2): via INTRAVENOUS

## 2016-08-06 MED ORDER — SUGAMMADEX SODIUM 200 MG/2ML IV SOLN
INTRAVENOUS | Status: DC | PRN
  Administered 2016-08-06: 200 mg via INTRAVENOUS

## 2016-08-06 MED ORDER — 0.9 % SODIUM CHLORIDE (POUR BTL) OPTIME
TOPICAL | Status: DC | PRN
  Administered 2016-08-06 (×2): 1000 mL

## 2016-08-06 MED ORDER — BUPIVACAINE HCL (PF) 0.5 % IJ SOLN
INTRAMUSCULAR | Status: DC | PRN
  Administered 2016-08-06: 20 mL

## 2016-08-06 MED ORDER — FENTANYL CITRATE (PF) 100 MCG/2ML IJ SOLN
INTRAMUSCULAR | Status: AC
  Filled 2016-08-06: qty 2

## 2016-08-06 MED ORDER — PHENYLEPHRINE 40 MCG/ML (10ML) SYRINGE FOR IV PUSH (FOR BLOOD PRESSURE SUPPORT)
PREFILLED_SYRINGE | INTRAVENOUS | Status: AC
  Filled 2016-08-06: qty 10

## 2016-08-06 MED ORDER — GLIPIZIDE 5 MG PO TABS
5.0000 mg | ORAL_TABLET | Freq: Two times a day (BID) | ORAL | Status: DC
  Administered 2016-08-07: 5 mg via ORAL
  Filled 2016-08-06 (×2): qty 1

## 2016-08-06 MED ORDER — CEFAZOLIN SODIUM-DEXTROSE 2-4 GM/100ML-% IV SOLN
2.0000 g | INTRAVENOUS | Status: AC
  Administered 2016-08-06: 2 g via INTRAVENOUS
  Filled 2016-08-06: qty 100

## 2016-08-06 MED ORDER — ONDANSETRON HCL 4 MG PO TABS: 4.0000 mg | ORAL_TABLET | Freq: Four times a day (QID) | ORAL | Status: DC | PRN

## 2016-08-06 MED ORDER — INSULIN ASPART 100 UNIT/ML ~~LOC~~ SOLN: 0.0000 [IU] | Freq: Every day | SUBCUTANEOUS | Status: DC

## 2016-08-06 MED ORDER — MAGNESIUM HYDROXIDE 400 MG/5ML PO SUSP: 30.0000 mL | Freq: Every day | ORAL | Status: DC | PRN

## 2016-08-06 MED ORDER — THROMBIN 20000 UNITS EX SOLR
CUTANEOUS | Status: AC
  Filled 2016-08-06: qty 20000

## 2016-08-06 MED ORDER — LIDOCAINE-EPINEPHRINE (PF) 2 %-1:200000 IJ SOLN
INTRAMUSCULAR | Status: AC
  Filled 2016-08-06: qty 20

## 2016-08-06 MED ORDER — ACETAMINOPHEN 10 MG/ML IV SOLN
INTRAVENOUS | Status: DC | PRN
  Administered 2016-08-06: 1000 mg via INTRAVENOUS

## 2016-08-06 MED ORDER — BISACODYL 10 MG RE SUPP: 10.0000 mg | Freq: Every day | RECTAL | Status: DC | PRN

## 2016-08-06 MED ORDER — THROMBIN 5000 UNITS EX SOLR
CUTANEOUS | Status: AC
  Filled 2016-08-06: qty 5000

## 2016-08-06 MED ORDER — HYDROMORPHONE HCL 1 MG/ML IJ SOLN
INTRAMUSCULAR | Status: AC
Start: 2016-08-06 — End: 2016-08-06
  Administered 2016-08-06: 0.5 mg via INTRAVENOUS
  Filled 2016-08-06: qty 1

## 2016-08-06 MED ORDER — KETOROLAC TROMETHAMINE 30 MG/ML IJ SOLN
15.0000 mg | Freq: Four times a day (QID) | INTRAMUSCULAR | Status: DC
  Administered 2016-08-06 – 2016-08-07 (×3): 15 mg via INTRAVENOUS
  Filled 2016-08-06 (×3): qty 1

## 2016-08-06 MED ORDER — ALUM & MAG HYDROXIDE-SIMETH 200-200-20 MG/5ML PO SUSP: 30.0000 mL | Freq: Four times a day (QID) | ORAL | Status: DC | PRN

## 2016-08-06 MED ORDER — HYDROXYZINE HCL 50 MG PO TABS
50.0000 mg | ORAL_TABLET | ORAL | Status: DC | PRN
  Filled 2016-08-06: qty 1

## 2016-08-06 MED ORDER — PROPOFOL 10 MG/ML IV BOLUS
INTRAVENOUS | Status: DC | PRN
  Administered 2016-08-06: 150 mg via INTRAVENOUS

## 2016-08-06 MED ORDER — ONDANSETRON HCL 4 MG/2ML IJ SOLN: 4.0000 mg | Freq: Four times a day (QID) | INTRAMUSCULAR | Status: DC | PRN

## 2016-08-06 MED ORDER — FENTANYL CITRATE (PF) 100 MCG/2ML IJ SOLN
INTRAMUSCULAR | Status: DC | PRN
  Administered 2016-08-06 (×4): 100 ug via INTRAVENOUS

## 2016-08-06 MED ORDER — SODIUM CHLORIDE 0.9% FLUSH
3.0000 mL | Freq: Two times a day (BID) | INTRAVENOUS | Status: DC
  Administered 2016-08-06: 3 mL via INTRAVENOUS

## 2016-08-06 MED ORDER — ARTIFICIAL TEARS OP OINT
TOPICAL_OINTMENT | OPHTHALMIC | Status: AC
  Filled 2016-08-06: qty 3.5

## 2016-08-06 MED ORDER — HYDROCHLOROTHIAZIDE 25 MG PO TABS
25.0000 mg | ORAL_TABLET | Freq: Every day | ORAL | Status: DC
  Administered 2016-08-06: 25 mg via ORAL
  Filled 2016-08-06: qty 1

## 2016-08-06 MED ORDER — GABAPENTIN 100 MG PO CAPS
100.0000 mg | ORAL_CAPSULE | Freq: Three times a day (TID) | ORAL | Status: DC
  Administered 2016-08-06 (×2): 100 mg via ORAL
  Filled 2016-08-06 (×2): qty 1

## 2016-08-06 MED ORDER — POTASSIUM CHLORIDE IN NACL 20-0.9 MEQ/L-% IV SOLN: INTRAVENOUS | Status: DC

## 2016-08-06 MED ORDER — LIDOCAINE-EPINEPHRINE (PF) 2 %-1:200000 IJ SOLN
INTRAMUSCULAR | Status: DC | PRN
Start: 2016-08-06 — End: 2016-08-06
  Administered 2016-08-06: 20 mL via INTRADERMAL

## 2016-08-06 MED ORDER — LOSARTAN POTASSIUM 50 MG PO TABS
50.0000 mg | ORAL_TABLET | Freq: Two times a day (BID) | ORAL | Status: DC
  Administered 2016-08-06: 50 mg via ORAL
  Filled 2016-08-06: qty 1

## 2016-08-06 MED ORDER — ACETAMINOPHEN 10 MG/ML IV SOLN
INTRAVENOUS | Status: AC
  Filled 2016-08-06: qty 100

## 2016-08-06 MED ORDER — FLEET ENEMA 7-19 GM/118ML RE ENEM: 1.0000 | ENEMA | Freq: Once | RECTAL | Status: DC | PRN

## 2016-08-06 MED ORDER — ARTIFICIAL TEARS OP OINT
TOPICAL_OINTMENT | OPHTHALMIC | Status: DC | PRN
  Administered 2016-08-06: 1 via OPHTHALMIC

## 2016-08-06 MED ORDER — ROCURONIUM BROMIDE 50 MG/5ML IV SOSY
PREFILLED_SYRINGE | INTRAVENOUS | Status: AC
  Filled 2016-08-06: qty 5

## 2016-08-06 MED ORDER — HYDROMORPHONE HCL 1 MG/ML IJ SOLN
0.2500 mg | INTRAMUSCULAR | Status: DC | PRN
  Administered 2016-08-06 (×2): 0.5 mg via INTRAVENOUS

## 2016-08-06 MED ORDER — ONDANSETRON HCL 4 MG/2ML IJ SOLN
INTRAMUSCULAR | Status: AC
  Filled 2016-08-06: qty 2

## 2016-08-06 MED ORDER — KETOROLAC TROMETHAMINE 15 MG/ML IJ SOLN
INTRAMUSCULAR | Status: AC
  Administered 2016-08-06: 15 mg
  Filled 2016-08-06: qty 1

## 2016-08-06 MED ORDER — INSULIN ASPART 100 UNIT/ML ~~LOC~~ SOLN
0.0000 [IU] | Freq: Three times a day (TID) | SUBCUTANEOUS | Status: DC
  Administered 2016-08-06 – 2016-08-07 (×2): 3 [IU] via SUBCUTANEOUS

## 2016-08-06 MED ORDER — PHENOL 1.4 % MT LIQD
1.0000 | OROMUCOSAL | Status: DC | PRN
Start: 2016-08-06 — End: 2016-08-07

## 2016-08-06 MED ORDER — THROMBIN 5000 UNITS EX SOLR
OROMUCOSAL | Status: DC | PRN
  Administered 2016-08-06: 07:00:00 via TOPICAL

## 2016-08-06 MED ORDER — PROPOFOL 10 MG/ML IV BOLUS
INTRAVENOUS | Status: AC
  Filled 2016-08-06: qty 20

## 2016-08-06 MED ORDER — ACETAMINOPHEN 650 MG RE SUPP: 650.0000 mg | RECTAL | Status: DC | PRN

## 2016-08-06 MED ORDER — SODIUM CHLORIDE 0.9 % IV SOLN: 250.0000 mL | INTRAVENOUS | Status: DC

## 2016-08-06 MED ORDER — PHENYLEPHRINE HCL 10 MG/ML IJ SOLN
INTRAVENOUS | Status: DC | PRN
  Administered 2016-08-06: 40 ug/min via INTRAVENOUS

## 2016-08-06 MED ORDER — MORPHINE SULFATE (PF) 4 MG/ML IV SOLN: 4.0000 mg | INTRAVENOUS | Status: DC | PRN

## 2016-08-06 MED ORDER — HYDROXYZINE HCL 50 MG/ML IM SOLN: 50.0000 mg | INTRAMUSCULAR | Status: DC | PRN

## 2016-08-06 MED ORDER — PROMETHAZINE HCL 25 MG/ML IJ SOLN: 6.2500 mg | INTRAMUSCULAR | Status: DC | PRN

## 2016-08-06 MED ORDER — SUGAMMADEX SODIUM 200 MG/2ML IV SOLN
INTRAVENOUS | Status: AC
  Filled 2016-08-06: qty 2

## 2016-08-06 MED ORDER — ROCURONIUM BROMIDE 100 MG/10ML IV SOLN
INTRAVENOUS | Status: DC | PRN
  Administered 2016-08-06: 10 mg via INTRAVENOUS
  Administered 2016-08-06: 50 mg via INTRAVENOUS
  Administered 2016-08-06: 10 mg via INTRAVENOUS

## 2016-08-06 MED ORDER — LIDOCAINE 2% (20 MG/ML) 5 ML SYRINGE
INTRAMUSCULAR | Status: AC
  Filled 2016-08-06: qty 10

## 2016-08-06 MED ORDER — MENTHOL 3 MG MT LOZG: 1.0000 | LOZENGE | OROMUCOSAL | Status: DC | PRN

## 2016-08-06 MED ORDER — SODIUM CHLORIDE 0.9% FLUSH: 3.0000 mL | INTRAVENOUS | Status: DC | PRN

## 2016-08-06 MED ORDER — HYDROCODONE-ACETAMINOPHEN 5-325 MG PO TABS
1.0000 | ORAL_TABLET | ORAL | Status: DC | PRN
  Administered 2016-08-06 – 2016-08-07 (×3): 2 via ORAL
  Filled 2016-08-06 (×3): qty 2

## 2016-08-06 MED ORDER — OXYCODONE-ACETAMINOPHEN 5-325 MG PO TABS
1.0000 | ORAL_TABLET | ORAL | Status: DC | PRN
  Administered 2016-08-06: 2 via ORAL

## 2016-08-06 MED ORDER — OXYCODONE-ACETAMINOPHEN 5-325 MG PO TABS
ORAL_TABLET | ORAL | Status: AC
  Administered 2016-08-06: 2 via ORAL
  Filled 2016-08-06: qty 2

## 2016-08-06 MED ORDER — BUPIVACAINE HCL (PF) 0.5 % IJ SOLN
INTRAMUSCULAR | Status: AC
  Filled 2016-08-06: qty 30

## 2016-08-06 MED ORDER — SODIUM CHLORIDE 0.9 % IR SOLN
Status: DC | PRN
  Administered 2016-08-06: 07:00:00

## 2016-08-06 MED ORDER — ACETAMINOPHEN 325 MG PO TABS: 650.0000 mg | ORAL_TABLET | ORAL | Status: DC | PRN

## 2016-08-06 MED ORDER — THROMBIN 20000 UNITS EX SOLR
CUTANEOUS | Status: DC | PRN
  Administered 2016-08-06: 07:00:00 via TOPICAL

## 2016-08-06 MED ORDER — METFORMIN HCL 500 MG PO TABS
1000.0000 mg | ORAL_TABLET | Freq: Two times a day (BID) | ORAL | Status: DC
  Administered 2016-08-06 – 2016-08-07 (×2): 1000 mg via ORAL
  Filled 2016-08-06 (×2): qty 2

## 2016-08-06 MED ORDER — KETOROLAC TROMETHAMINE 30 MG/ML IJ SOLN: 15.0000 mg | Freq: Once | INTRAMUSCULAR | Status: DC

## 2016-08-06 SURGICAL SUPPLY — 65 items
ADH SKN CLS APL DERMABOND .7 (GAUZE/BANDAGES/DRESSINGS) ×1
APL SKNCLS STERI-STRIP NONHPOA (GAUZE/BANDAGES/DRESSINGS)
BAG DECANTER FOR FLEXI CONT (MISCELLANEOUS) ×2 IMPLANT
BENZOIN TINCTURE PRP APPL 2/3 (GAUZE/BANDAGES/DRESSINGS) IMPLANT
BLADE CLIPPER SURG (BLADE) IMPLANT
BUR ACORN 6.0 ACORN (BURR) IMPLANT
BUR ACRON 5.0MM COATED (BURR) ×1 IMPLANT
BUR MATCHSTICK NEURO 3.0 LAGG (BURR) ×2 IMPLANT
CANISTER SUCT 3000ML PPV (MISCELLANEOUS) ×2 IMPLANT
CARTRIDGE OIL MAESTRO DRILL (MISCELLANEOUS) ×1 IMPLANT
DERMABOND ADVANCED (GAUZE/BANDAGES/DRESSINGS) ×1
DERMABOND ADVANCED .7 DNX12 (GAUZE/BANDAGES/DRESSINGS) IMPLANT
DIFFUSER DRILL AIR PNEUMATIC (MISCELLANEOUS) ×2 IMPLANT
DRAPE LAPAROTOMY 100X72X124 (DRAPES) ×2 IMPLANT
DRAPE MICROSCOPE LEICA (MISCELLANEOUS) ×1 IMPLANT
DRAPE POUCH INSTRU U-SHP 10X18 (DRAPES) ×2 IMPLANT
ELECT REM PT RETURN 9FT ADLT (ELECTROSURGICAL) ×2
ELECTRODE REM PT RTRN 9FT ADLT (ELECTROSURGICAL) ×1 IMPLANT
GAUZE SPONGE 4X4 12PLY STRL (GAUZE/BANDAGES/DRESSINGS) ×1 IMPLANT
GAUZE SPONGE 4X4 16PLY XRAY LF (GAUZE/BANDAGES/DRESSINGS) ×1 IMPLANT
GLOVE BIO SURGEON STRL SZ8 (GLOVE) ×1 IMPLANT
GLOVE BIOGEL PI IND STRL 7.5 (GLOVE) IMPLANT
GLOVE BIOGEL PI IND STRL 8 (GLOVE) ×1 IMPLANT
GLOVE BIOGEL PI INDICATOR 7.5 (GLOVE) ×2
GLOVE BIOGEL PI INDICATOR 8 (GLOVE) ×1
GLOVE ECLIPSE 7.5 STRL STRAW (GLOVE) ×2 IMPLANT
GLOVE EXAM NITRILE LRG STRL (GLOVE) IMPLANT
GLOVE EXAM NITRILE XL STR (GLOVE) IMPLANT
GLOVE EXAM NITRILE XS STR PU (GLOVE) IMPLANT
GLOVE SURG SS PI 7.0 STRL IVOR (GLOVE) ×3 IMPLANT
GOWN STRL REUS W/ TWL LRG LVL3 (GOWN DISPOSABLE) ×1 IMPLANT
GOWN STRL REUS W/ TWL XL LVL3 (GOWN DISPOSABLE) IMPLANT
GOWN STRL REUS W/TWL 2XL LVL3 (GOWN DISPOSABLE) IMPLANT
GOWN STRL REUS W/TWL LRG LVL3 (GOWN DISPOSABLE) ×2
GOWN STRL REUS W/TWL XL LVL3 (GOWN DISPOSABLE) ×4
HEMOSTAT POWDER SURGIFOAM 1G (HEMOSTASIS) ×1 IMPLANT
KIT BASIN OR (CUSTOM PROCEDURE TRAY) ×2 IMPLANT
KIT ROOM TURNOVER OR (KITS) ×2 IMPLANT
NDL HYPO 18GX1.5 BLUNT FILL (NEEDLE) IMPLANT
NDL SPNL 18GX3.5 QUINCKE PK (NEEDLE) ×1 IMPLANT
NDL SPNL 22GX3.5 QUINCKE BK (NEEDLE) ×1 IMPLANT
NEEDLE HYPO 18GX1.5 BLUNT FILL (NEEDLE) IMPLANT
NEEDLE SPNL 18GX3.5 QUINCKE PK (NEEDLE) ×4 IMPLANT
NEEDLE SPNL 22GX3.5 QUINCKE BK (NEEDLE) ×2 IMPLANT
NS IRRIG 1000ML POUR BTL (IV SOLUTION) ×3 IMPLANT
OIL CARTRIDGE MAESTRO DRILL (MISCELLANEOUS) ×2
PACK LAMINECTOMY NEURO (CUSTOM PROCEDURE TRAY) ×2 IMPLANT
PAD ARMBOARD 7.5X6 YLW CONV (MISCELLANEOUS) ×6 IMPLANT
PATTIES SURGICAL .5 X1 (DISPOSABLE) ×3 IMPLANT
RUBBERBAND STERILE (MISCELLANEOUS) ×2 IMPLANT
SPONGE LAP 4X18 X RAY DECT (DISPOSABLE) ×1 IMPLANT
SPONGE NEURO XRAY DETECT 1X3 (DISPOSABLE) ×1 IMPLANT
SPONGE SURGIFOAM ABS GEL 100 (HEMOSTASIS) ×2 IMPLANT
STRIP CLOSURE SKIN 1/2X4 (GAUZE/BANDAGES/DRESSINGS) IMPLANT
SUT BONE WAX W31G (SUTURE) ×1 IMPLANT
SUT PROLENE 6 0 BV (SUTURE) IMPLANT
SUT VIC AB 1 CT1 18XBRD ANBCTR (SUTURE) ×1 IMPLANT
SUT VIC AB 1 CT1 8-18 (SUTURE) ×6
SUT VIC AB 2-0 CP2 18 (SUTURE) ×3 IMPLANT
SUT VIC AB 3-0 SH 8-18 (SUTURE) ×1 IMPLANT
SYR 5ML LL (SYRINGE) IMPLANT
TAPE CLOTH SURG 6X10 WHT LF (GAUZE/BANDAGES/DRESSINGS) ×1 IMPLANT
TOWEL OR 17X24 6PK STRL BLUE (TOWEL DISPOSABLE) ×2 IMPLANT
TOWEL OR 17X26 10 PK STRL BLUE (TOWEL DISPOSABLE) ×3 IMPLANT
WATER STERILE IRR 1000ML POUR (IV SOLUTION) ×2 IMPLANT

## 2016-08-06 NOTE — Progress Notes (Signed)
Vitals:   08/06/16 1200 08/06/16 1215 08/06/16 1230 08/06/16 1349  BP: 108/73 127/69 123/68 (!) 146/99  Pulse: 79 74 74 77  Resp: 15 16 11 18   Temp:    98 F (36.7 C)  TempSrc:      SpO2: 99% 97% 98% 95%    CBC  Recent Labs  08/03/16 1530  WBC 7.5  HGB 13.3  HCT 37.6*  PLT 159   BMET  Recent Labs  08/03/16 1530  NA 137  K 3.9  CL 104  CO2 23  GLUCOSE 214*  BUN 18  CREATININE 1.40*  CALCIUM 9.5    Patient resting in bed, comfortable. Vomited on his way up from PACU to [facility]. Dressing clean and dry. Has not yet voided, have asked the nurse to do in and out catheterization. Has not yet ambulated in the halls.  Plan: Encouraged to ambulate. Will continue to progress through postoperative recovery.  Hosie Spangle, MD 08/06/2016, 3:21 PM

## 2016-08-06 NOTE — H&P (Signed)
Subjective: Patient is a 71 y.o. right-handed white male who is admitted for treatment of multilevel, multifactorial lumbar stenosis.  We have followed the patient since 2012 for his stenosis. He's been developing increasingly disabling neurogenic claudication. He is admitted now for a L3-L5 decompressive lumbar laminectomy.   Patient Active Problem List   Diagnosis Date Noted  . Erectile dysfunction 12/05/2012  . Sensory neuropathy ([place]) 12/05/2012  . Hyperlipidemia LDL goal <100 12/05/2012  . Rectal bleeding 10/13/2012  . Hypertension 07/07/2011  . Diabetes mellitus ([place]) 07/07/2011  . Fatty liver 07/07/2011   Past Medical History:  Diagnosis Date  . Arthritis   . Cramps of left lower extremity   . Depression   . Diabetes mellitus    type 2 for 7-8 yrs  . Dyspnea   . Elevated liver enzymes   . Fatty liver   . History of kidney stones   . Hyperlipidemia   . Hypertension   . Vertigo     Past Surgical History:  Procedure Laterality Date  . CARPAL TUNNEL RELEASE     both wrist  . cataract surgery     bilateral  . CHOLECYSTECTOMY    . COLON SURGERY    . EYE SURGERY      Prescriptions Prior to Admission  Medication Sig Dispense Refill Last Dose  . glipiZIDE (GLUCOTROL) 5 MG tablet TAKE ONE TABLET BY MOUTH TWICE DAILY BEFORE A MEAL (Patient taking differently: Take 5 mg by mouth 2 (two) times daily before a meal. TAKE ONE TABLET BY MOUTH TWICE DAILY BEFORE A MEAL) 180 tablet 1 08/06/2016 at 0430  . hydrochlorothiazide (HYDRODIURIL) 25 MG tablet Take 1 tablet (25 mg total) by mouth daily. 90 tablet 1 08/05/2016 at Unknown time  . losartan (COZAAR) 50 MG tablet TAKE 1 TABLET BY MOUTH TWICE A DAY (NEEDS OFFICE VISIT) 60 tablet 1 08/06/2016 at 0430  . metFORMIN (GLUCOPHAGE) 500 MG tablet Take 2 tablets (1,000 mg total) by mouth 2 (two) times daily. 360 tablet 1 08/06/2016 at 0430  . naproxen sodium (ANAPROX) 220 MG tablet Take 220 mg by mouth 4 (four) times daily as needed (pain).     08/03/2016  . gabapentin (NEURONTIN) 100 MG capsule Take 100 mg by mouth 3 (three) times daily.   Unknown at Unknown time  . glucose blood (ACCU-CHEK AVIVA PLUS) test strip USE ONE STRIP TO CHECK GLUCOSE ONCE DAILY AS DIRECTED 25 each 5 Taking  . glucose blood (RELION ULTIMA TEST) test strip Use as instructed 20 each prn Taking  . meclizine (ANTIVERT) 25 MG tablet 1 po tid until 24 hours without vertigo (Patient not taking: Reported on 07/30/2016) 28 tablet 0 Not Taking at Unknown time  . vardenafil (LEVITRA) 10 MG tablet Take 1 tablet (10 mg total) by mouth daily as needed for erectile dysfunction. (Patient not taking: Reported on 07/30/2016) 4 tablet 11 Not Taking at Unknown time   Allergies  Allergen Reactions  . Demerol Nausea And Vomiting  . Vasotec [Enalapril] Cough    Social History  Substance Use Topics  . Smoking status: Former Smoker    Types: Cigarettes    Quit date: 10/14/1982  . Smokeless tobacco: Never Used     Comment: 29 yrs ago  . Alcohol use No    Family History  Problem Relation Age of Onset  . Diabetes Mother   . Cancer Mother   . Diabetes Sister   . Diabetes Maternal Grandmother      Review of Systems A comprehensive  review of systems was negative.  Objective: Vital signs in last 24 hours: Temp:  [98 F (36.7 C)] 98 F (36.7 C) (01/11 0630) Pulse Rate:  [84] 84 (01/11 0630) BP: (171)/(81) 171/81 (01/11 0630) SpO2:  [97 %] 97 % (01/11 0630)  EXAM: Patient is a well-developed well-nourished white male in no acute distress. Lungs are clear to auscultation , the patient has symmetrical respiratory excursion. Heart has a regular rate and rhythm normal S1 and S2 no murmur.   Abdomen is soft nontender nondistended bowel sounds are present. Extremity examination shows no clubbing cyanosis or edema. Motor examination shows 5 over 5 strength in the lower extremities including the iliopsoas quadriceps dorsiflexor extensor hallicus  longus and plantar flexor bilaterally.  Sensation is intact to pinprick in the distal lower extremities. Reflexes are symmetrical bilaterally. No pathologic reflexes are present. Patient has a normal gait and stance.   Data Review:CBC    Component Value Date/Time   WBC 7.5 08/03/2016 1530   RBC 3.85 (L) 08/03/2016 1530   HGB 13.3 08/03/2016 1530   HCT 37.6 (L) 08/03/2016 1530   PLT 159 08/03/2016 1530   MCV 97.7 08/03/2016 1530   MCH 34.5 (H) 08/03/2016 1530   MCHC 35.4 08/03/2016 1530   RDW 13.2 08/03/2016 1530   LYMPHSABS 0.8 12/26/2014 1143   MONOABS 0.2 12/26/2014 1143   EOSABS 0.0 12/26/2014 1143   BASOSABS 0.0 12/26/2014 1143                          BMET    Component Value Date/Time   NA 137 08/03/2016 1530   NA 139 02/14/2016 0813   K 3.9 08/03/2016 1530   CL 104 08/03/2016 1530   CO2 23 08/03/2016 1530   GLUCOSE 214 (H) 08/03/2016 1530   BUN 18 08/03/2016 1530   BUN 19 02/14/2016 0813   CREATININE 1.40 (H) 08/03/2016 1530   CREATININE 1.16 02/21/2014 0703   CALCIUM 9.5 08/03/2016 1530   GFRNONAA 49 (L) 08/03/2016 1530   GFRAA 57 (L) 08/03/2016 1530     Assessment/Plan: Patient with multilevel, multifactorial lumbar stenosis with worsening neurogenic claudication was admitted for decompressive lumbar laminectomy.  I've discussed with the patient the nature of his condition, the nature the surgical procedure, the typical length of surgery, hospital stay, and overall recuperation, the limitations postoperatively, and risks of surgery. I discussed risks including risks of infection, bleeding, possibly need for transfusion, the risk of nerve root dysfunction with pain, weakness, numbness, or paresthesias, the risk of dural tear and CSF leakage and possible need for further surgery, the risk of anesthetic complications including myocardial infarction, stroke, pneumonia, and death. We discussed the need for postoperative immobilization in a lumbar brace. Understanding all this the patient does wish to proceed  with surgery and is admitted for such.     Hosie Spangle, MD 08/06/2016 7:28 AM

## 2016-08-06 NOTE — Progress Notes (Signed)
Pt placed on hold for 3c@ 12:00. VSS. No c/o pain. Sleeping most of the time. Report to M. Judye Bos, RN @ 12:35

## 2016-08-06 NOTE — Anesthesia Preprocedure Evaluation (Addendum)
Anesthesia Evaluation  Patient identified by MRN, date of birth, ID band Patient awake    Reviewed: Allergy & Precautions, NPO status , Patient's Chart, lab work & pertinent test results  Airway Mallampati: II  TM Distance: >3 FB Neck ROM: Full    Dental no notable dental hx.    Pulmonary neg pulmonary ROS, former smoker,    Pulmonary exam normal breath sounds clear to auscultation       Cardiovascular hypertension, Normal cardiovascular exam Rhythm:Regular Rate:Normal     Neuro/Psych negative neurological ROS  negative psych ROS   GI/Hepatic negative GI ROS, Neg liver ROS,   Endo/Other  diabetes  Renal/GU negative Renal ROS  negative genitourinary   Musculoskeletal negative musculoskeletal ROS (+)   Abdominal   Peds negative pediatric ROS (+)  Hematology negative hematology ROS (+)   Anesthesia Other Findings   Reproductive/Obstetrics negative OB ROS                            Anesthesia Physical Anesthesia Plan  ASA: II  Anesthesia Plan: General   Post-op Pain Management:    Induction: Intravenous  Airway Management Planned: Oral ETT  Additional Equipment:   Intra-op Plan:   Post-operative Plan: Extubation in OR  Informed Consent: I have reviewed the patients History and Physical, chart, labs and discussed the procedure including the risks, benefits and alternatives for the proposed anesthesia with the patient or authorized representative who has indicated his/her understanding and acceptance.   Dental advisory given  Plan Discussed with: CRNA and Surgeon  Anesthesia Plan Comments:         Anesthesia Quick Evaluation

## 2016-08-06 NOTE — Transfer of Care (Signed)
Immediate Anesthesia Transfer of Care Note  Patient: Jack Mejia  Procedure(s) Performed: Procedure(s): LUMBAR THREE- LUMBAR FIVE  DECOMPRESSIVE LUMBAR LAMINECTOMY (N/A)  Patient Location: PACU  Anesthesia Type:General  Level of Consciousness: awake, alert  and oriented  Airway & Oxygen Therapy: Patient Spontanous Breathing and Patient connected to nasal cannula oxygen  Post-op Assessment: Report given to RN, Post -op Vital signs reviewed and stable and Patient moving all extremities  Post vital signs: Reviewed and stable  Last Vitals:  Vitals:   08/06/16 0630 08/06/16 1030  BP: (!) 171/81 137/76  Pulse: 84 82  Resp:  13  Temp: 36.7 C 36.7 C    Last Pain:  Vitals:   08/06/16 0630  TempSrc: Oral         Complications: No apparent anesthesia complications

## 2016-08-06 NOTE — Op Note (Signed)
08/06/2016  10:18 AM  PATIENT:  Jack Mejia  71 y.o. male  PRE-OPERATIVE DIAGNOSIS:  Multilevel level, multifactorial lumbar stenosis; lumbar spondylosis, lumbar degenerative disc disease  POST-OPERATIVE DIAGNOSIS:  Multilevel level, multifactorial lumbar stenosis; lumbar spondylosis, lumbar degenerative disc disease  PROCEDURE:  Procedure(s):  L3, L4, and L5 decompressive lumbar laminectomy including medial facetectomy and foraminotomies for decompression of the stenotic compression of the exiting L3, L4, and L5 nerve roots bilaterally  SURGEON:  Surgeon(s): Jovita Gamma, MD Eustace Moore, MD  ASSISTANTS: Sherley Bounds, M.D.  ANESTHESIA:   general  EBL:  Total I/O In: 1300 [I.V.:1300] Out: 50 [Blood:50]  BLOOD ADMINISTERED:none  COUNT: Correct per nursing staff  DICTATION: Patient was brought to the operating room placed under general endotracheal anesthesia. Patient was turned to a prone position the lumbar region was prepped with Betadine soap and solution and draped in a sterile fashion. The midline was infiltrated with local anesthetic with epinephrine. A localizing x-ray was taken and then a midline incision was made carried down thru the subcutaneous tissue, bipolar cautery and electrocautery were used to maintain hemostasis. Dissection was carried down to the lumbar fascia which was incised bilaterally and the paraspinal muscles were dissected from the spinous process and lamina in a subperiosteal fashion. Another localizing x-ray was taken and the L3, L4, and L5 levels were identified. Laminectomy was begun with double-action rongeurs a high-speed drill and Kerrison punches. The thickened ligamentum flavum was carefully removed. Dissection was carried laterally, performing medial facetectomies, to decompress the lateral stenosis taking care to leave the facet complexes intact. Foraminotomies were performed for the exiting L3, L4, and L5 nerve roots bilaterally. Once the  decompression was completed hemostasis was established with the use of bipolar cautery and Gelfoam with thrombin. The Gelfoam was removed, and a thin layer of Surgifoam applied. Hemostasis was confirmed, we proceeded with closure. Paraspinal muscles, deep fascia, and Scarpa's fascia were closed in separate layers with interrupted 1 undyed Vicryl sutures. The subcutaneous and subcuticular were closed with interrupted inverted 2-0 undyed Vicryl sutures. Skin edges were approximated with Dermabond.  The wound was dressed with sterile gauze and Hypafix.  PLAN OF CARE: Admit for overnight observation  PATIENT DISPOSITION:  PACU - hemodynamically stable.   Delay start of Pharmacological VTE agent (>24hrs) due to surgical blood loss or risk of bleeding:  yes

## 2016-08-06 NOTE — Anesthesia Postprocedure Evaluation (Addendum)
Anesthesia Post Note  Patient: Jack Mejia  Procedure(s) Performed: Procedure(s) (LRB): LUMBAR THREE- LUMBAR FIVE  DECOMPRESSIVE LUMBAR LAMINECTOMY (N/A)  Patient location during evaluation: PACU Anesthesia Type: General Level of consciousness: awake and alert Pain management: pain level controlled Vital Signs Assessment: post-procedure vital signs reviewed and stable Respiratory status: spontaneous breathing, nonlabored ventilation, respiratory function stable and patient connected to nasal cannula oxygen Cardiovascular status: blood pressure returned to baseline and stable Postop Assessment: no signs of nausea or vomiting Anesthetic complications: no       Last Vitals:  Vitals:   08/06/16 1130 08/06/16 1145  BP: (!) 112/55 (!) 110/58  Pulse: 72 73  Resp: 11 12  Temp:      Last Pain:  Vitals:   08/06/16 0630  TempSrc: Oral                 Akilah Cureton S

## 2016-08-06 NOTE — Anesthesia Procedure Notes (Signed)
Procedure Name: Intubation Date/Time: 08/06/2016 7:40 AM Performed by: Izora Gala Pre-anesthesia Checklist: Patient identified, Emergency Drugs available, Suction available and Patient being monitored Patient Re-evaluated:Patient Re-evaluated prior to inductionOxygen Delivery Method: Circle system utilized Preoxygenation: Pre-oxygenation with 100% oxygen Intubation Type: IV induction Ventilation: Mask ventilation without difficulty and Oral airway inserted - appropriate to patient size Laryngoscope Size: Miller and 3 Grade View: Grade I Tube type: Oral Tube size: 8.0 mm Number of attempts: 1 Airway Equipment and Method: Stylet Placement Confirmation: ETT inserted through vocal cords under direct vision,  positive ETCO2 and breath sounds checked- equal and bilateral Secured at: 22 cm Tube secured with: Tape Dental Injury: Teeth and Oropharynx as per pre-operative assessment

## 2016-08-07 ENCOUNTER — Encounter (HOSPITAL_COMMUNITY): Payer: Self-pay | Admitting: Neurosurgery

## 2016-08-07 LAB — GLUCOSE, CAPILLARY: Glucose-Capillary: 191 mg/dL — ABNORMAL HIGH (ref 65–99)

## 2016-08-07 MED ORDER — HYDROCODONE-ACETAMINOPHEN 5-325 MG PO TABS: 1.0000 | ORAL_TABLET | ORAL | 0 refills | Status: DC | PRN

## 2016-08-07 MED ORDER — TAMSULOSIN HCL 0.4 MG PO CAPS
0.4000 mg | ORAL_CAPSULE | Freq: Every day | ORAL | Status: DC
  Administered 2016-08-07: 0.4 mg via ORAL
  Filled 2016-08-07: qty 1

## 2016-08-07 NOTE — Progress Notes (Signed)
Patient alert and oriented, mae's well, voiding adequate amount of urine, swallowing without difficulty, no c/o pain. Patient discharged home with family. Script and discharged instructions given to patient. Patient and family stated understanding of d/c instructions given and has an appointment with MD. 

## 2016-08-07 NOTE — Discharge Instructions (Signed)

## 2016-08-07 NOTE — Discharge Summary (Signed)
Physician Discharge Summary  Patient ID: Jack Mejia MRN: [id number] DOB/AGE: May 12, 1946 71 y.o.  Admit date: 08/06/2016 Discharge date: 08/07/2016  Admission Diagnoses:  Multilevel level, multifactorial lumbar stenosis; lumbar spondylosis, lumbar degenerative disc disease  Discharge Diagnoses:  Multilevel level, multifactorial lumbar stenosis; lumbar spondylosis, lumbar degenerative disc disease Active Problems:   Lumbar stenosis with neurogenic claudication   Discharged Condition: good  Hospital Course: Patient admitted, taken to surgery for decompressive lumbar laminectomy. He is done well following surgery. He is up and ambulate actively in the halls. Dressing is to be removed by the nursing staff, and left open to air. He is voiding well. He's had good relief of his neurogenic claudication. He has been given instructions regarding wound care and activities following discharge. He is scheduled for follow-up is with me in about 3 weeks at the office.  Discharge Exam: Blood pressure (!) 103/46, pulse (!) 107, temperature (!) 100.7 F (38.2 C), temperature source Oral, resp. rate 20, SpO2 96 %.  Disposition: 01-Home or Self Care  Discharge Instructions    Discharge wound care:    Complete by:  As directed    Leave the wound open to air. Shower daily with the wound uncovered. Water and soapy water should run over the incision area. Do not wash directly on the incision for 2 weeks. Remove the glue after 2 weeks.   Driving Restrictions    Complete by:  As directed    No driving for 2 weeks. May ride in the car locally now. May begin to drive locally in 2 weeks.   Other Restrictions    Complete by:  As directed    Walk gradually increasing distances out in the fresh air at least twice a day. Walking additional 6 times inside the house, gradually increasing distances, daily. No bending, lifting, or twisting. Perform activities between shoulder and waist height (that is at counter  height when standing or table height when sitting).     Allergies as of 08/07/2016      Reactions   Demerol Nausea And Vomiting   Vasotec [enalapril] Cough      Medication List    STOP taking these medications   meclizine 25 MG tablet Commonly known as:  ANTIVERT   vardenafil 10 MG tablet Commonly known as:  LEVITRA     TAKE these medications   gabapentin 100 MG capsule Commonly known as:  NEURONTIN Take 100 mg by mouth 3 (three) times daily.   glipiZIDE 5 MG tablet Commonly known as:  GLUCOTROL TAKE ONE TABLET BY MOUTH TWICE DAILY BEFORE A MEAL What changed:  how much to take  how to take this  when to take this  additional instructions   glucose blood test strip Commonly known as:  RELION ULTIMA TEST Use as instructed   glucose blood test strip Commonly known as:  ACCU-CHEK AVIVA PLUS USE ONE STRIP TO CHECK GLUCOSE ONCE DAILY AS DIRECTED   hydrochlorothiazide 25 MG tablet Commonly known as:  HYDRODIURIL Take 1 tablet (25 mg total) by mouth daily.   HYDROcodone-acetaminophen 5-325 MG tablet Commonly known as:  NORCO/VICODIN Take 1-2 tablets by mouth every 4 (four) hours as needed (mild pain).   losartan 50 MG tablet Commonly known as:  COZAAR TAKE 1 TABLET BY MOUTH TWICE A DAY (NEEDS OFFICE VISIT)   metFORMIN 500 MG tablet Commonly known as:  GLUCOPHAGE Take 2 tablets (1,000 mg total) by mouth 2 (two) times daily.   naproxen sodium 220 MG tablet Commonly  known as:  ANAPROX Take 220 mg by mouth 4 (four) times daily as needed (pain).        SignedHosie Spangle 08/07/2016, 7:58 AM

## 2016-08-14 ENCOUNTER — Other Ambulatory Visit: Payer: Self-pay | Admitting: Family Medicine

## 2016-08-17 ENCOUNTER — Encounter: Payer: Self-pay | Admitting: Family Medicine

## 2016-08-17 ENCOUNTER — Ambulatory Visit (INDEPENDENT_AMBULATORY_CARE_PROVIDER_SITE_OTHER): Payer: Medicare HMO | Admitting: Family Medicine

## 2016-08-17 VITALS — BP 130/76 | Ht 70.0 in | Wt 223.2 lb

## 2016-08-17 DIAGNOSIS — I10 Essential (primary) hypertension: Secondary | ICD-10-CM

## 2016-08-17 DIAGNOSIS — M5430 Sciatica, unspecified side: Secondary | ICD-10-CM

## 2016-08-17 DIAGNOSIS — E119 Type 2 diabetes mellitus without complications: Secondary | ICD-10-CM | POA: Diagnosis not present

## 2016-08-17 DIAGNOSIS — M5432 Sciatica, left side: Secondary | ICD-10-CM

## 2016-08-17 MED ORDER — GLIPIZIDE 5 MG PO TABS: ORAL_TABLET | ORAL | 1 refills | Status: DC

## 2016-08-17 NOTE — Progress Notes (Signed)
   Subjective:    Patient ID: Jack Mejia, male    DOB: [birth year], 71 y.o.   MRN: [id number] Patient arrives office with numerous concerns Diabetes  He presents for his follow-up diabetic visit. He has type 2 diabetes mellitus. There are no hypoglycemic associated symptoms. There are no diabetic associated symptoms. There are no hypoglycemic complications. There are no diabetic complications. There are no known risk factors for coronary artery disease. Current diabetic treatment includes oral agent (dual therapy). He is compliant with treatment all of the time.  Last A1C was 08/03/16 and it was 8.0.  Results for orders placed or performed during the hospital encounter of 08/06/16  Glucose, capillary  Result Value Ref Range   Glucose-Capillary 189 (H) 65 - 99 mg/dL  Glucose, capillary  Result Value Ref Range   Glucose-Capillary 173 (H) 65 - 99 mg/dL  Glucose, capillary  Result Value Ref Range   Glucose-Capillary 172 (H) 65 - 99 mg/dL   Comment 1 Notify RN    Comment 2 Document in Chart   Glucose, capillary  Result Value Ref Range   Glucose-Capillary 164 (H) 65 - 99 mg/dL  Glucose, capillary  Result Value Ref Range   Glucose-Capillary 185 (H) 65 - 99 mg/dL   Comment 1 Notify RN    Comment 2 Document in Chart   Glucose, capillary  Result Value Ref Range   Glucose-Capillary 191 (H) 65 - 99 mg/dL   Blood pressure medicine and blood pressure levels reviewed today with patient. Compliant with blood pressure medicine. States does not miss a dose. No obvious side effects. Blood pressure generally good when checked elsewhere. Watching salt intake.   Patient experiencing ongoing neuropathic pain. However much improved compared to before. Still taking the Neurontin but feels he can cut back, parameters discussed with patient. Patient has not had eye exam yet.  Patient has no new concerns at this time.    Review of Systems No headache, no major weight loss or weight gain, no chest  pain no back pain abdominal pain no change in bowel habits complete ROS otherwise negative     Objective:   Physical Exam Alert vitals stable, NAD. Blood pressure good on repeat. HEENT normal. Lungs clear. Heart regular rate and rhythm. No longer straight leg raise present. Distal sensation intact in legs       Assessment & Plan:  Impression 1 type 2 diabetes suboptimum control discussed need to increase glipizide rationale discussed #2 hypertension good control discussed maintain same meds compliance review #3 neuropathic pain, status post surgery, clinically improved parameters for coming off pain medicine discuss plan diet exercise discussed. Exercise encourage. Medications refilled. 25 minutes spent most in discussion WSL

## 2016-08-28 ENCOUNTER — Encounter: Payer: Self-pay | Admitting: Internal Medicine

## 2016-08-31 ENCOUNTER — Other Ambulatory Visit: Payer: Self-pay | Admitting: Family Medicine

## 2016-09-01 DIAGNOSIS — R69 Illness, unspecified: Secondary | ICD-10-CM | POA: Diagnosis not present

## 2016-09-02 DIAGNOSIS — R69 Illness, unspecified: Secondary | ICD-10-CM | POA: Diagnosis not present

## 2016-09-11 ENCOUNTER — Other Ambulatory Visit: Payer: Self-pay | Admitting: Family Medicine

## 2016-09-16 ENCOUNTER — Other Ambulatory Visit: Payer: Self-pay | Admitting: Family Medicine

## 2016-10-19 ENCOUNTER — Other Ambulatory Visit: Payer: Self-pay | Admitting: *Deleted

## 2016-10-19 DIAGNOSIS — H5200 Hypermetropia, unspecified eye: Secondary | ICD-10-CM | POA: Diagnosis not present

## 2016-10-19 DIAGNOSIS — R69 Illness, unspecified: Secondary | ICD-10-CM | POA: Diagnosis not present

## 2016-10-19 DIAGNOSIS — Z0100 Encounter for examination of eyes and vision without abnormal findings: Secondary | ICD-10-CM | POA: Diagnosis not present

## 2016-10-19 DIAGNOSIS — I10 Essential (primary) hypertension: Secondary | ICD-10-CM | POA: Diagnosis not present

## 2016-10-19 DIAGNOSIS — E109 Type 1 diabetes mellitus without complications: Secondary | ICD-10-CM | POA: Diagnosis not present

## 2016-10-19 LAB — HM DIABETES EYE EXAM

## 2016-10-19 MED ORDER — LOSARTAN POTASSIUM 50 MG PO TABS: ORAL_TABLET | ORAL | 1 refills | Status: DC

## 2016-11-03 DIAGNOSIS — T162XXA Foreign body in left ear, initial encounter: Secondary | ICD-10-CM | POA: Diagnosis not present

## 2016-11-10 ENCOUNTER — Encounter: Payer: Self-pay | Admitting: *Deleted

## 2016-12-14 DIAGNOSIS — E119 Type 2 diabetes mellitus without complications: Secondary | ICD-10-CM | POA: Diagnosis not present

## 2016-12-14 DIAGNOSIS — H26492 Other secondary cataract, left eye: Secondary | ICD-10-CM | POA: Diagnosis not present

## 2016-12-28 NOTE — Addendum Note (Signed)
Addendum  created 12/28/16 1000 by Myrtie Soman, MD   Sign clinical note

## 2017-02-22 ENCOUNTER — Telehealth: Payer: Self-pay | Admitting: Family Medicine

## 2017-02-22 ENCOUNTER — Encounter: Payer: Medicare HMO | Admitting: Family Medicine

## 2017-02-22 ENCOUNTER — Other Ambulatory Visit: Payer: Self-pay | Admitting: Nurse Practitioner

## 2017-02-22 DIAGNOSIS — E785 Hyperlipidemia, unspecified: Secondary | ICD-10-CM

## 2017-02-22 DIAGNOSIS — I10 Essential (primary) hypertension: Secondary | ICD-10-CM

## 2017-02-22 DIAGNOSIS — E0800 Diabetes mellitus due to underlying condition with hyperosmolarity without nonketotic hyperglycemic-hyperosmolar coma (NKHHC): Secondary | ICD-10-CM

## 2017-02-22 DIAGNOSIS — Z125 Encounter for screening for malignant neoplasm of prostate: Secondary | ICD-10-CM

## 2017-02-22 NOTE — Telephone Encounter (Signed)
Please order: Met 7 Lipid  Liver  Urine microprotein ratio PSA

## 2017-02-22 NOTE — Telephone Encounter (Signed)
Patient has an appointment on 03/01/17 with Dr. Richardson Landry.  He wants to know if he is due for labs?

## 2017-02-23 NOTE — Telephone Encounter (Signed)
Blood work ordered in EPIC. Patient notified. 

## 2017-02-24 DIAGNOSIS — E0800 Diabetes mellitus due to underlying condition with hyperosmolarity without nonketotic hyperglycemic-hyperosmolar coma (NKHHC): Secondary | ICD-10-CM | POA: Diagnosis not present

## 2017-02-24 DIAGNOSIS — I10 Essential (primary) hypertension: Secondary | ICD-10-CM | POA: Diagnosis not present

## 2017-02-24 DIAGNOSIS — Z125 Encounter for screening for malignant neoplasm of prostate: Secondary | ICD-10-CM | POA: Diagnosis not present

## 2017-02-24 DIAGNOSIS — E785 Hyperlipidemia, unspecified: Secondary | ICD-10-CM | POA: Diagnosis not present

## 2017-02-25 LAB — HEPATIC FUNCTION PANEL
ALT: 46 IU/L — ABNORMAL HIGH (ref 0–44)
AST: 41 IU/L — ABNORMAL HIGH (ref 0–40)
Albumin: 4.4 g/dL (ref 3.5–4.8)
Alkaline Phosphatase: 69 IU/L (ref 39–117)
Bilirubin Total: 0.6 mg/dL (ref 0.0–1.2)
Bilirubin, Direct: 0.15 mg/dL (ref 0.00–0.40)
Total Protein: 7.4 g/dL (ref 6.0–8.5)

## 2017-02-25 LAB — BASIC METABOLIC PANEL
BUN/Creatinine Ratio: 16 (ref 10–24)
BUN: 23 mg/dL (ref 8–27)
CO2: 22 mmol/L (ref 20–29)
Calcium: 10.2 mg/dL (ref 8.6–10.2)
Chloride: 101 mmol/L (ref 96–106)
Creatinine, Ser: 1.45 mg/dL — ABNORMAL HIGH (ref 0.76–1.27)
GFR calc Af Amer: 56 mL/min/{1.73_m2} — ABNORMAL LOW (ref >59)
GFR calc non Af Amer: 48 mL/min/{1.73_m2} — ABNORMAL LOW (ref >59)
Glucose: 205 mg/dL — ABNORMAL HIGH (ref 65–99)
Potassium: 5.1 mmol/L (ref 3.5–5.2)
Sodium: 138 mmol/L (ref 134–144)

## 2017-02-25 LAB — MICROALBUMIN / CREATININE URINE RATIO
Creatinine, Urine: 120.6 mg/dL
Microalb/Creat Ratio: 311.2 mg/g creat — ABNORMAL HIGH (ref 0.0–30.0)
Microalbumin, Urine: 375.3 ug/mL

## 2017-02-25 LAB — LIPID PANEL
Chol/HDL Ratio: 4.9 ratio (ref 0.0–5.0)
Cholesterol, Total: 175 mg/dL (ref 100–199)
HDL: 36 mg/dL — ABNORMAL LOW (ref >39)
LDL Calculated: 93 mg/dL (ref 0–99)
Triglycerides: 232 mg/dL — ABNORMAL HIGH (ref 0–149)
VLDL Cholesterol Cal: 46 mg/dL — ABNORMAL HIGH (ref 5–40)

## 2017-02-25 LAB — HEMOGLOBIN A1C
Est. average glucose Bld gHb Est-mCnc: 197 mg/dL
Hgb A1c MFr Bld: 8.5 % — ABNORMAL HIGH (ref 4.8–5.6)

## 2017-02-25 LAB — PSA: Prostate Specific Ag, Serum: 0.5 ng/mL (ref 0.0–4.0)

## 2017-03-01 ENCOUNTER — Encounter: Payer: Self-pay | Admitting: Family Medicine

## 2017-03-01 ENCOUNTER — Ambulatory Visit (HOSPITAL_COMMUNITY)
Admission: RE | Admit: 2017-03-01 | Discharge: 2017-03-01 | Disposition: A | Discharge status: Home / Self Care | Payer: Medicare HMO | Source: Ambulatory Visit | Attending: Family Medicine | Admitting: Family Medicine

## 2017-03-01 ENCOUNTER — Ambulatory Visit (INDEPENDENT_AMBULATORY_CARE_PROVIDER_SITE_OTHER): Payer: Medicare HMO | Admitting: Family Medicine

## 2017-03-01 VITALS — BP 164/72 | Ht 70.0 in | Wt 222.0 lb

## 2017-03-01 DIAGNOSIS — M47814 Spondylosis without myelopathy or radiculopathy, thoracic region: Secondary | ICD-10-CM | POA: Insufficient documentation

## 2017-03-01 DIAGNOSIS — R0600 Dyspnea, unspecified: Secondary | ICD-10-CM

## 2017-03-01 DIAGNOSIS — I10 Essential (primary) hypertension: Secondary | ICD-10-CM

## 2017-03-01 DIAGNOSIS — E785 Hyperlipidemia, unspecified: Secondary | ICD-10-CM

## 2017-03-01 DIAGNOSIS — F320 Major depressive disorder, single episode, mild: Secondary | ICD-10-CM | POA: Insufficient documentation

## 2017-03-01 DIAGNOSIS — R0609 Other forms of dyspnea: Secondary | ICD-10-CM | POA: Diagnosis not present

## 2017-03-01 DIAGNOSIS — R69 Illness, unspecified: Secondary | ICD-10-CM | POA: Diagnosis not present

## 2017-03-01 DIAGNOSIS — R0602 Shortness of breath: Secondary | ICD-10-CM | POA: Diagnosis not present

## 2017-03-01 DIAGNOSIS — E0800 Diabetes mellitus due to underlying condition with hyperosmolarity without nonketotic hyperglycemic-hyperosmolar coma (NKHHC): Secondary | ICD-10-CM

## 2017-03-01 MED ORDER — SITAGLIPTIN PHOSPHATE 100 MG PO TABS: 100.0000 mg | ORAL_TABLET | Freq: Every day | ORAL | 5 refills | Status: DC

## 2017-03-01 MED ORDER — METFORMIN HCL 500 MG PO TABS: 1000.0000 mg | ORAL_TABLET | Freq: Two times a day (BID) | ORAL | 1 refills | Status: DC

## 2017-03-01 MED ORDER — GLIPIZIDE 5 MG PO TABS
ORAL_TABLET | ORAL | 1 refills | Status: DC
Start: 2017-03-01 — End: 2017-08-29

## 2017-03-01 MED ORDER — HYDROCHLOROTHIAZIDE 25 MG PO TABS: 25.0000 mg | ORAL_TABLET | Freq: Every day | ORAL | 1 refills | Status: DC

## 2017-03-01 MED ORDER — LOSARTAN POTASSIUM 50 MG PO TABS: ORAL_TABLET | ORAL | 1 refills | Status: DC

## 2017-03-01 NOTE — Progress Notes (Signed)
Subjective:    Patient ID: Jack Mejia, male    DOB: 1945-10-29, 71 y.o.   MRN: [id number]  HPI    Patient arrives office originally for a wellness exam, but this was changed due to many acute concerns.  Patient reports shortness of breath with exertion. This is been going on for months. No associated chest pain. No associated cough. Positive history of diabetes with sensory neuropathy. Had surgery in January back. Overall this did help his sciatic nerve that kept him from exercising. Patient notes he feels excessively shortness of breath and has to positive weight sometimes he even after walking just 100 feet. No orthopnea.  Patient notes his sugars have been rising lately. Most fasting sugars in the neighborhood of 150-175. Claims compliance with diet. Claims compliance with medication. Patient claims compliance with diabetes medication. No obvious side effects. Reports no substantial low sugar spells. Most numbers are generally in good range when checked fasting. Generally does not miss a dose of medication. Watching diabetic diet closely  Blood pressure medicine and blood pressure levels reviewed today with patient. Compliant with blood pressure medicine. States does not miss a dose. No obvious side effects. Blood pressure generally good when checked elsewhere. Watching salt intake.   Patient's liver enzymes were elevated on blood work. States this happened in the past. It was associated with fatty liver. He does report having gained some weight recently  Results for orders placed or performed in visit on 02/22/17  Lipid panel  Result Value Ref Range   Cholesterol, Total 175 100 - 199 mg/dL   Triglycerides 232 (H) 0 - 149 mg/dL   HDL 36 (L) >39 mg/dL   VLDL Cholesterol Cal 46 (H) 5 - 40 mg/dL   LDL Calculated 93 0 - 99 mg/dL   Chol/HDL Ratio 4.9 0.0 - 5.0 ratio  Hepatic function panel  Result Value Ref Range   Total Protein 7.4 6.0 - 8.5 g/dL   Albumin 4.4 3.5 - 4.8 g/dL   Bilirubin Total 0.6 0.0 - 1.2 mg/dL   Bilirubin, Direct 0.15 0.00 - 0.40 mg/dL   Alkaline Phosphatase 69 39 - 117 IU/L   AST 41 (H) 0 - 40 IU/L   ALT 46 (H) 0 - 44 IU/L  Hemoglobin A1c  Result Value Ref Range   Hgb A1c MFr Bld 8.5 (H) 4.8 - 5.6 %   Est. average glucose Bld gHb Est-mCnc 197 mg/dL  Microalbumin / creatinine urine ratio  Result Value Ref Range   Creatinine, Urine 120.6 Not Estab. mg/dL   Albumin, Urine 375.3 Not Estab. ug/mL   Microalb/Creat Ratio 311.2 (H) 0.0 - 30.0 mg/g creat  PSA  Result Value Ref Range   Prostate Specific Ag, Serum 0.5 0.0 - 4.0 ng/mL  Basic metabolic panel  Result Value Ref Range   Glucose 205 (H) 65 - 99 mg/dL   BUN 23 8 - 27 mg/dL   Creatinine, Ser 1.45 (H) 0.76 - 1.27 mg/dL   GFR calc non Af Amer 48 (L) >59 mL/min/1.73   GFR calc Af Amer 56 (L) >59 mL/min/1.73   BUN/Creatinine Ratio 16 10 - 24   Sodium 138 134 - 144 mmol/L   Potassium 5.1 3.5 - 5.2 mmol/L   Chloride 101 96 - 106 mmol/L   CO2 22 20 - 29 mmol/L   Calcium 10.2 8.6 - 10.2 mg/dL   Patient claims compliance with diabetes medication. No obvious side effects. Reports no substantial low sugar spells. Most numbers are generally  in good range when checked fasting. Generally does not miss a dose of medication. Watching diabetic diet closel   Morn sugars have been runing 170 to 200, ave 185, no low sugar spells Cramps in legs lately.   Patient also reports intermittent feelings of feeling down. Notes depression at times. No thoughts of harming himself. States he's always been this way. Definitely not interested in either counseling or medication. seePH Q9  Review of Systems No headache, no major weight loss or weight gain, no chest pain no back pain abdominal pain no change in bowel habits complete ROS otherwise negative     Objective:   Physical Exam Alert and oriented, vitals reviewed and stable, NAD ENT-TM's and ext canals WNL bilat via otoscopic exam Soft palate, tonsils  and post pharynx WNL via oropharyngeal exam Neck-symmetric, no masses; thyroid nonpalpable and nontender Pulmonary-no tachypnea or accessory muscle use;Slight basilar crackles otherwise Clear without wheezes via auscultation Card--no abnrml murmurs, rhythm reg and rate WNL Carotid pulses symmetric, without bruits  C diabetic foot exam      Assessment & Plan:  Impression 1 exertional dyspnea discussed need to screen pulmonary status along with cardiac referral due to potential for this being an angina equivalent. #2 type 2 diabetes suboptimum discuss adjuvant Januvia No. 3 hypertension decent control discussed continue #4 mild depression clinically stable with no interest in medications or counseling. Follow-up in several weeks for wellness.

## 2017-03-02 ENCOUNTER — Other Ambulatory Visit (HOSPITAL_COMMUNITY)
Admission: RE | Admit: 2017-03-02 | Discharge: 2017-03-02 | Disposition: A | Discharge status: Home / Self Care | Payer: Medicare HMO | Source: Ambulatory Visit | Attending: Family Medicine | Admitting: Family Medicine

## 2017-03-02 ENCOUNTER — Encounter: Payer: Self-pay | Admitting: Family Medicine

## 2017-03-02 ENCOUNTER — Ambulatory Visit (INDEPENDENT_AMBULATORY_CARE_PROVIDER_SITE_OTHER): Payer: Medicare HMO | Admitting: Family Medicine

## 2017-03-02 ENCOUNTER — Telehealth: Payer: Self-pay | Admitting: *Deleted

## 2017-03-02 VITALS — BP 154/56 | HR 98 | Temp 98.3°F | Wt 221.0 lb

## 2017-03-02 DIAGNOSIS — R0609 Other forms of dyspnea: Secondary | ICD-10-CM | POA: Diagnosis not present

## 2017-03-02 DIAGNOSIS — R0600 Dyspnea, unspecified: Secondary | ICD-10-CM

## 2017-03-02 DIAGNOSIS — R0602 Shortness of breath: Secondary | ICD-10-CM | POA: Diagnosis not present

## 2017-03-02 DIAGNOSIS — R079 Chest pain, unspecified: Secondary | ICD-10-CM

## 2017-03-02 LAB — BRAIN NATRIURETIC PEPTIDE: B Natriuretic Peptide: 16 pg/mL (ref 0.0–100.0)

## 2017-03-02 LAB — TROPONIN I: Troponin I: <0.03 ng/mL (ref <0.03)

## 2017-03-02 MED ORDER — ASPIRIN EC 81 MG PO TBEC: 81.0000 mg | DELAYED_RELEASE_TABLET | Freq: Every day | ORAL | 5 refills | Status: DC

## 2017-03-02 NOTE — Telephone Encounter (Signed)
I had disc doing bnp (thought I may have written on chart but maybe not), sorry pt can not be seen for sveral weeks by card, geez, rec o v this aft we will see again do ekg and let's get a troponin level and bnp

## 2017-03-02 NOTE — Telephone Encounter (Signed)
Called pt to tell him xray was normal. He then wanted bw results. I told him results will be reviewed with him at next appt 8/28.  Pt then wanted to know if he still needed to see cardiologist since xray was normal I told him yes. Keep appt with cardiology on 8/31. Pt states Dr. Richardson Landry told him he would need additional bloodwork before seeing cardiologist. Please advise.

## 2017-03-02 NOTE — Progress Notes (Signed)
   Subjective:    Patient ID: Jack Mejia, male    DOB: July 16, 1946, 71 y.o.   MRN: [id number]  HPI  Patient in today with c/o sob. Patient states sob has improved. Symptoms come and go periodically.   Patient presents with concerning symptomatology. Particularly considering his many risk factors. Next  Patient reports a past 6 months shortness of breath with exertion. Comes and goes. When it is occurring it is very significant. Patient able to walk only 100 feet without having to stop. Next  No history of wheezing no history of asthma.  No known history of coronary artery disease but certainly many risk factors.  When the patient get shortness of breath he does at times have some sweatiness. No nausea. No chest pain.   States no other concerns     Review of Systems No headache, no major weight loss or weight gain, no chest pain no back pain abdominal pain no change in bowel habits complete ROS otherwise negative     Objective:   Physical Exam Alert and oriented, vitals reviewed and stable, NAD ENT-TM's and ext canals WNL bilat via otoscopic exam Soft palate, tonsils and post pharynx WNL via oropharyngeal exam Neck-symmetric, no masses; thyroid nonpalpable and nontender Pulmonary-no tachypnea or accessory muscle use; Clear without wheezes via auscultation Card--no abnrml murmurs, rhythm reg and rate WNL Carotid pulses symmetric, without bruits    EKG Q wave V2. Nonspecific ST-T changes.    Assessment & Plan:  Impression exertional dyspnea. This is concerning. We have been unable to get a prompt visit with cardiologist. Since delayed. A  need to do couple blood tests. BNP already ordered and we added a troponin. Both of these came back negative. Patient also reports shortness of breath comes and goes at times he is able to mow the grass etc. without experiencing it. We will initiate low-dose aspirin. Very long discussion held. Warning signs discussed at length. If  suddenly worsens needs to go immediately to emergency room.  Greater than 50% of this 25 minute face to face visit was spent in counseling and discussion and coordination of care regarding the above diagnosis/diagnosies

## 2017-03-04 ENCOUNTER — Encounter: Payer: Self-pay | Admitting: Family Medicine

## 2017-03-23 ENCOUNTER — Encounter: Payer: Self-pay | Admitting: Family Medicine

## 2017-03-23 ENCOUNTER — Ambulatory Visit (INDEPENDENT_AMBULATORY_CARE_PROVIDER_SITE_OTHER): Payer: Medicare HMO | Admitting: Family Medicine

## 2017-03-23 VITALS — BP 154/84 | Ht 69.5 in | Wt 226.0 lb

## 2017-03-23 DIAGNOSIS — Z0000 Encounter for general adult medical examination without abnormal findings: Secondary | ICD-10-CM

## 2017-03-23 DIAGNOSIS — R0602 Shortness of breath: Secondary | ICD-10-CM | POA: Diagnosis not present

## 2017-03-23 DIAGNOSIS — E0800 Diabetes mellitus due to underlying condition with hyperosmolarity without nonketotic hyperglycemic-hyperosmolar coma (NKHHC): Secondary | ICD-10-CM

## 2017-03-23 NOTE — Progress Notes (Signed)
Subjective:    Patient ID: Jack Mejia, male    DOB: August 04, 1945, 71 y.o.   MRN: [id number]  HPI AWV- Annual Wellness Visit  The patient was seen for their annual wellness visit. The patient's past medical history, surgical history, and family history were reviewed. Pertinent vaccines were reviewed ( tetanus, pneumonia, shingles, flu) The patient's medication list was reviewed and updated.  The height and weight were entered. The patient's current BMI is: 32.9  Cognitive screening was completed. Outcome of Mini - Cog: pass  Falls within the past 6 months: none  Current tobacco usage: none (All patients who use tobacco were given written and verbal information on quitting)  Recent listing of emergency department/hospitalizations over the past year were reviewed.  current specialist the patient sees on a regular basis: none   Medicare annual wellness visit patient questionnaire was reviewed.  A written screening schedule for the patient for the next 5-10 years was given. Appropriate discussion of followup regarding next visit was discussed.   not much exercise these days walks some  Diet not so good  Me   Results for orders placed or performed during the hospital encounter of 03/02/17  Brain natriuretic peptide  Result Value Ref Range   B Natriuretic Peptide 16.0 0.0 - 100.0 pg/mL  Troponin I  Result Value Ref Range   Troponin I <0.03 <0.03 ng/mL   Patient did not get Januvia as prescribed. States it would've cost him $200. Per month. Notes sugars have improved somewhat. Most morning sugars in the neighborhood of 150. This despite diet not be in the best     Due to see cardiologist later this week. Still experiencing dyspnea with exertion but not as bad       Review of Systems  Constitutional: Negative for activity change, appetite change and fever.  HENT: Negative for congestion and rhinorrhea.   Eyes: Negative for discharge.  Respiratory: Negative for  cough and wheezing.   Cardiovascular: Negative for chest pain.  Gastrointestinal: Negative for abdominal pain, blood in stool and vomiting.  Genitourinary: Negative for difficulty urinating and frequency.  Musculoskeletal: Negative for neck pain.  Skin: Negative for rash.  Allergic/Immunologic: Negative for environmental allergies and food allergies.  Neurological: Negative for weakness and headaches.  Psychiatric/Behavioral: Negative for agitation.  All other systems reviewed and are negative.      Objective:   Physical Exam  Constitutional: He appears well-developed and well-nourished.  HENT:  Head: Normocephalic and atraumatic.  Right Ear: External ear normal.  Left Ear: External ear normal.  Nose: Nose normal.  Mouth/Throat: Oropharynx is clear and moist.  Eyes: Pupils are equal, round, and reactive to light. EOM are normal.  Neck: Normal range of motion. Neck supple. No thyromegaly present.  Cardiovascular: Normal rate, regular rhythm and normal heart sounds.   No murmur heard. Pulmonary/Chest: Effort normal and breath sounds normal. No respiratory distress. He has no wheezes.  Abdominal: Soft. Bowel sounds are normal. He exhibits no distension and no mass. There is no tenderness.  Genitourinary: Penis normal.  Musculoskeletal: Normal range of motion. He exhibits no edema.  Lymphadenopathy:    He has no cervical adenopathy.  Neurological: He is alert. He exhibits normal muscle tone.  Skin: Skin is warm and dry. No erythema.  Psychiatric: He has a normal mood and affect. His behavior is normal. Judgment normal.  Vitals reviewed.         Assessment & Plan:  Impression 1 wellness exam. Mental health stable.  PH 9 stable. Diet and exercise not good strongly encouraged. Patient declines all vaccines, not open to taking any of them. Also declines recommendation for colonoscopy. States he's never can have another one. #2 type 2 diabetes suboptimum. Unable to afford pressure  meds. Patient work harder on diet. If numbers consistently rise both 200  In morning patie will return for further workup #3, dyspnea with exertion. Cardiology appointment pending. Concerned about potential  Cardiac etiology. Discussed

## 2017-03-26 ENCOUNTER — Encounter: Payer: Self-pay | Admitting: Cardiovascular Disease

## 2017-03-26 ENCOUNTER — Ambulatory Visit (INDEPENDENT_AMBULATORY_CARE_PROVIDER_SITE_OTHER): Payer: Medicare HMO | Admitting: Cardiovascular Disease

## 2017-03-26 VITALS — BP 164/86 | HR 80 | Ht 70.0 in | Wt 225.4 lb

## 2017-03-26 DIAGNOSIS — I10 Essential (primary) hypertension: Secondary | ICD-10-CM | POA: Diagnosis not present

## 2017-03-26 DIAGNOSIS — R0609 Other forms of dyspnea: Secondary | ICD-10-CM | POA: Diagnosis not present

## 2017-03-26 DIAGNOSIS — Z79899 Other long term (current) drug therapy: Secondary | ICD-10-CM

## 2017-03-26 DIAGNOSIS — E785 Hyperlipidemia, unspecified: Secondary | ICD-10-CM | POA: Diagnosis not present

## 2017-03-26 DIAGNOSIS — E118 Type 2 diabetes mellitus with unspecified complications: Secondary | ICD-10-CM

## 2017-03-26 DIAGNOSIS — R0600 Dyspnea, unspecified: Secondary | ICD-10-CM

## 2017-03-26 MED ORDER — ROSUVASTATIN CALCIUM 20 MG PO TABS: 20.0000 mg | ORAL_TABLET | Freq: Every day | ORAL | 3 refills | Status: DC

## 2017-03-26 NOTE — Progress Notes (Signed)
CARDIOLOGY CONSULT NOTE  Patient ID: Jack Mejia MRN: [id number] DOB/AGE: [birth year] 71 y.o.  Admit date: (Not on file) Primary Physician: [name], MD Referring Physician: Dr. Wolfgang Phoenix  Reason for Consultation: Exertional dyspnea  HPI: Jack Mejia is a 71 y.o. male who is being seen today for the evaluation of exertional dyspnea at the request of Luking, Grace Bushy, MD.   He has a history of hypertension and type 2 diabetes both of which are suboptimally controlled. Upon reviewing PCP notes, he has refused vaccinations and recommended screening colonoscopy.  ECG performed by PCP on 03/02/17 which I personally interpreted demonstrated sinus rhythm with diffuse nonspecific ST segment abnormalities.  The patient appears quite stoic and minimizes his symptoms. He denies exertional chest pain. He does have occasional dyspnea on exertion which appears to be sporadic. He denies palpitations, leg swelling, orthopnea, and paroxysmal nocturnal dyspnea.  It appears he was prescribed Januvia but he has not started it yet due to the high cost.  He had a normal chest x-ray. Recent labs show BNP 16, hemoglobin A1c 8.5%, AST 41, ALT 46, and normal troponin.  CT of the abdomen and pelvis on 06/19/14 showed patchy hepatic steatosis.    Allergies  Allergen Reactions  . Demerol Nausea And Vomiting  . Vasotec [Enalapril] Cough    Current Outpatient Prescriptions  Medication Sig Dispense Refill  . ACCU-CHEK AVIVA PLUS test strip USE ONE STRIP TO CHECK GLUCOSE ONCE DAILY AS DIRECTED 50 each 5  . aspirin EC 81 MG tablet Take 1 tablet (81 mg total) by mouth daily. 30 tablet 5  . glipiZIDE (GLUCOTROL) 5 MG tablet Take 2 tablets po BID 360 tablet 1  . glucose blood (RELION ULTIMA TEST) test strip Use as instructed 20 each prn  . hydrochlorothiazide (HYDRODIURIL) 25 MG tablet Take 1 tablet (25 mg total) by mouth daily. 90 tablet 1  . loratadine (CLARITIN) 10 MG tablet Take 10 mg  by mouth daily.    [name] losartan (COZAAR) 50 MG tablet TAKE 1 TABLET BY MOUTH TWICE A DAY 180 tablet 1  . metFORMIN (GLUCOPHAGE) 500 MG tablet Take 2 tablets (1,000 mg total) by mouth 2 (two) times daily. 360 tablet 1  . naproxen sodium (ANAPROX) 220 MG tablet Take 220 mg by mouth 2 (two) times daily as needed.     No current facility-administered medications for this visit.     Past Medical History:  Diagnosis Date  . Arthritis   . Cramps of left lower extremity   . Depression   . Diabetes mellitus    type 2 for 7-8 yrs  . Dyspnea   . Elevated liver enzymes   . Fatty liver   . History of kidney stones   . Hyperlipidemia   . Hypertension   . Vertigo     Past Surgical History:  Procedure Laterality Date  . CARPAL TUNNEL RELEASE     both wrist  . cataract surgery     bilateral  . CHOLECYSTECTOMY    . COLON SURGERY    . EYE SURGERY    . LUMBAR LAMINECTOMY/DECOMPRESSION MICRODISCECTOMY N/A 08/06/2016   Procedure: LUMBAR THREE- LUMBAR FIVE  DECOMPRESSIVE LUMBAR LAMINECTOMY;  Surgeon: Jovita Gamma, MD;  Location: [facility];  Service: Neurosurgery;  Laterality: N/A;    Social History   Social History  . Marital status: Divorced    Spouse name: N/A  . Number of children: N/A  . Years of education: N/A  Occupational History  . Not on file.   Social History Main Topics  . Smoking status: Former Smoker    Types: Cigarettes    Quit date: 10/14/1982  . Smokeless tobacco: Never Used     Comment: 29 yrs ago  . Alcohol use No  . Drug use: No  . Sexual activity: No   Other Topics Concern  . Not on file   Social History Narrative  . No narrative on file     No family history of premature CAD in 1st degree relatives.  Current Meds  Medication Sig  . ACCU-CHEK AVIVA PLUS test strip USE ONE STRIP TO CHECK GLUCOSE ONCE DAILY AS DIRECTED  . aspirin EC 81 MG tablet Take 1 tablet (81 mg total) by mouth daily.  [name] glipiZIDE (GLUCOTROL) 5 MG tablet Take 2 tablets po BID  .  glucose blood (RELION ULTIMA TEST) test strip Use as instructed  . hydrochlorothiazide (HYDRODIURIL) 25 MG tablet Take 1 tablet (25 mg total) by mouth daily.  [name] loratadine (CLARITIN) 10 MG tablet Take 10 mg by mouth daily.  [name] losartan (COZAAR) 50 MG tablet TAKE 1 TABLET BY MOUTH TWICE A DAY  . metFORMIN (GLUCOPHAGE) 500 MG tablet Take 2 tablets (1,000 mg total) by mouth 2 (two) times daily.  . naproxen sodium (ANAPROX) 220 MG tablet Take 220 mg by mouth 2 (two) times daily as needed.      Review of systems complete and found to be negative unless listed above in HPI    Physical exam Blood pressure (!) 170/84, pulse 85, height 5\' 10"  (1.778 m), weight 225 lb 6.4 oz (102.2 kg), SpO2 98 %. General: NAD Neck: No JVD, no thyromegaly or thyroid nodule.  Lungs: Clear to auscultation bilaterally with normal respiratory effort. CV: Nondisplaced PMI. Regular rate and rhythm, normal S1/S2, no S3/S4, no murmur.  No peripheral edema.  No carotid bruit.    Abdomen: Firm, protuberant.  Skin: Intact without lesions or rashes.  Neurologic: Alert and oriented x 3.  Psych: Normal affect. Extremities: No clubbing or cyanosis.  HEENT: Normal.   ECG: Most recent ECG reviewed.   Labs: Lab Results  Component Value Date/Time   K 5.1 02/24/2017 08:03 AM   BUN 23 02/24/2017 08:03 AM   CREATININE 1.45 (H) 02/24/2017 08:03 AM   CREATININE 1.16 02/21/2014 07:03 AM   ALT 46 (H) 02/24/2017 08:03 AM   HGB 13.3 08/03/2016 03:30 PM     Lipids: Lab Results  Component Value Date/Time   LDLCALC 93 02/24/2017 08:03 AM   CHOL 175 02/24/2017 08:03 AM   TRIG 232 (H) 02/24/2017 08:03 AM   HDL 36 (L) 02/24/2017 08:03 AM        ASSESSMENT AND PLAN:  1. Exertional dyspnea: He has multiple cardiovascular risk factors including hypertension and type 2 diabetes which are both suboptimally controlled. His current 10 year ASCVD risk is 61.4%. I will proceed with a nuclear myocardial perfusion imaging study to  evaluate for ischemic heart disease (exercise Myoview).  2. Hypertension: Markedly elevated on hydrochlorothiazide 25 mg daily and losartan 50 mg twice daily. I will initiate amlodipine 5 mg daily if it remains persistently elevated. It appears it has been elevated consistently at PCPs office visits.  3. Type 2 diabetes: Currently on metformin 1000 mg twice daily. He is also on glipizide 10 mg twice a day. Based on his risk factors, he requires statin therapy. He plans to begin exercising.  4. Hyperlipidemia: Total cholesterol 232, HDL 36,  LDL 93. As stated above, his current 10 year ASCVD risk is 61.4%. Statin therapy is indicated. This is also indicated due to his history of type 2 diabetes. I will start Crestor 20 mg daily. He has hepatic steatosis. Recent liver transaminases were minimally elevated. I will repeat LFTs in 8 weeks. He plans to begin exercising.     Disposition: Follow up in 6 weeks.   Signed: [name], M.D., F.A.C.C.  03/26/2017, 8:33 AM

## 2017-03-26 NOTE — Patient Instructions (Addendum)
Your physician recommends that you schedule a follow-up appointment in: 6 weeks with Dr Bronson Ing   START Crestor 20 mg daily at dinner for elevated cholesterol  In 2 months , get lab test for LFT's  Your physician has requested that you have en exercise stress myoview. For further information please visit [email or web address]. Please follow instruction sheet, as given.        Thank you for choosing [facility] !

## 2017-04-05 ENCOUNTER — Telehealth: Payer: Self-pay | Admitting: Family Medicine

## 2017-04-05 NOTE — Telephone Encounter (Signed)
error 

## 2017-04-06 ENCOUNTER — Telehealth: Payer: Self-pay

## 2017-04-06 ENCOUNTER — Encounter (HOSPITAL_COMMUNITY): Payer: Self-pay

## 2017-04-06 ENCOUNTER — Encounter (HOSPITAL_BASED_OUTPATIENT_CLINIC_OR_DEPARTMENT_OTHER)
Admission: RE | Admit: 2017-04-06 | Discharge: 2017-04-06 | Disposition: A | Discharge status: Home / Self Care | Payer: Medicare HMO | Source: Ambulatory Visit | Attending: Cardiovascular Disease | Admitting: Cardiovascular Disease

## 2017-04-06 ENCOUNTER — Encounter (HOSPITAL_COMMUNITY)
Admission: RE | Admit: 2017-04-06 | Discharge: 2017-04-06 | Disposition: A | Discharge status: Home / Self Care | Payer: Medicare HMO | Source: Ambulatory Visit | Attending: Cardiovascular Disease | Admitting: Cardiovascular Disease

## 2017-04-06 DIAGNOSIS — R0609 Other forms of dyspnea: Secondary | ICD-10-CM | POA: Diagnosis not present

## 2017-04-06 DIAGNOSIS — R0600 Dyspnea, unspecified: Secondary | ICD-10-CM

## 2017-04-06 LAB — NM MYOCAR MULTI W/SPECT W/WALL MOTION / EF
Estimated workload: 4.6 METS
Exercise duration (min): 4 min
Exercise duration (sec): 1 s
LV dias vol: 71 mL (ref 62–150)
LV sys vol: 34 mL
MPHR: 150 {beats}/min
Peak HR: 151 {beats}/min
Percent HR: 100 %
RATE: 0.35
RPE: 13
Rest HR: 81 {beats}/min
SDS: 0
SRS: 0
SSS: 0
TID: 1.35

## 2017-04-06 MED ORDER — SODIUM CHLORIDE 0.9% FLUSH
INTRAVENOUS | Status: AC
  Administered 2017-04-06: 10 mL via INTRAVENOUS
  Filled 2017-04-06: qty 10

## 2017-04-06 MED ORDER — TECHNETIUM TC 99M TETROFOSMIN IV KIT
10.0000 | PACK | Freq: Once | INTRAVENOUS | Status: AC | PRN
  Administered 2017-04-06: 10 via INTRAVENOUS

## 2017-04-06 MED ORDER — TECHNETIUM TC 99M TETROFOSMIN IV KIT
30.0000 | PACK | Freq: Once | INTRAVENOUS | Status: AC | PRN
  Administered 2017-04-06: 30 via INTRAVENOUS

## 2017-04-06 MED ORDER — REGADENOSON 0.4 MG/5ML IV SOLN
INTRAVENOUS | Status: AC
  Filled 2017-04-06: qty 5

## 2017-04-06 NOTE — Telephone Encounter (Signed)
-----   Message from Herminio Commons, MD sent at 04/06/2017  2:51 PM EDT ----- No significant blockages.

## 2017-04-06 NOTE — Telephone Encounter (Signed)
Pt made aware, voiced understanding. 

## 2017-04-15 ENCOUNTER — Ambulatory Visit (INDEPENDENT_AMBULATORY_CARE_PROVIDER_SITE_OTHER): Payer: Medicare HMO | Admitting: Family Medicine

## 2017-04-15 ENCOUNTER — Encounter (HOSPITAL_COMMUNITY): Payer: Self-pay | Admitting: *Deleted

## 2017-04-15 ENCOUNTER — Emergency Department (HOSPITAL_COMMUNITY): Payer: Medicare HMO

## 2017-04-15 ENCOUNTER — Encounter: Payer: Self-pay | Admitting: Family Medicine

## 2017-04-15 ENCOUNTER — Emergency Department (HOSPITAL_COMMUNITY)
Admission: EM | Admit: 2017-04-15 | Discharge: 2017-04-15 | Disposition: A | Discharge status: Home / Self Care | Payer: Medicare HMO | Attending: Emergency Medicine | Admitting: Emergency Medicine

## 2017-04-15 ENCOUNTER — Telehealth: Payer: Self-pay | Admitting: Family Medicine

## 2017-04-15 VITALS — Ht 69.5 in | Wt 224.6 lb

## 2017-04-15 DIAGNOSIS — Z7984 Long term (current) use of oral hypoglycemic drugs: Secondary | ICD-10-CM | POA: Insufficient documentation

## 2017-04-15 DIAGNOSIS — R1031 Right lower quadrant pain: Secondary | ICD-10-CM

## 2017-04-15 DIAGNOSIS — F329 Major depressive disorder, single episode, unspecified: Secondary | ICD-10-CM | POA: Insufficient documentation

## 2017-04-15 DIAGNOSIS — E119 Type 2 diabetes mellitus without complications: Secondary | ICD-10-CM | POA: Diagnosis not present

## 2017-04-15 DIAGNOSIS — I10 Essential (primary) hypertension: Secondary | ICD-10-CM | POA: Insufficient documentation

## 2017-04-15 DIAGNOSIS — Z7982 Long term (current) use of aspirin: Secondary | ICD-10-CM | POA: Diagnosis not present

## 2017-04-15 DIAGNOSIS — N132 Hydronephrosis with renal and ureteral calculous obstruction: Secondary | ICD-10-CM | POA: Insufficient documentation

## 2017-04-15 DIAGNOSIS — Z79899 Other long term (current) drug therapy: Secondary | ICD-10-CM | POA: Diagnosis not present

## 2017-04-15 DIAGNOSIS — Z87891 Personal history of nicotine dependence: Secondary | ICD-10-CM | POA: Insufficient documentation

## 2017-04-15 DIAGNOSIS — Z9049 Acquired absence of other specified parts of digestive tract: Secondary | ICD-10-CM | POA: Insufficient documentation

## 2017-04-15 DIAGNOSIS — R300 Dysuria: Secondary | ICD-10-CM

## 2017-04-15 DIAGNOSIS — R109 Unspecified abdominal pain: Secondary | ICD-10-CM | POA: Diagnosis not present

## 2017-04-15 DIAGNOSIS — R69 Illness, unspecified: Secondary | ICD-10-CM | POA: Diagnosis not present

## 2017-04-15 LAB — COMPREHENSIVE METABOLIC PANEL
ALT: 33 U/L (ref 17–63)
AST: 30 U/L (ref 15–41)
Albumin: 4.4 g/dL (ref 3.5–5.0)
Alkaline Phosphatase: 71 U/L (ref 38–126)
Anion gap: 11 (ref 5–15)
BUN: 25 mg/dL — ABNORMAL HIGH (ref 6–20)
CO2: 26 mmol/L (ref 22–32)
Calcium: 9.3 mg/dL (ref 8.9–10.3)
Chloride: 100 mmol/L — ABNORMAL LOW (ref 101–111)
Creatinine, Ser: 1.77 mg/dL — ABNORMAL HIGH (ref 0.61–1.24)
GFR calc Af Amer: 43 mL/min — ABNORMAL LOW (ref >60)
GFR calc non Af Amer: 37 mL/min — ABNORMAL LOW (ref >60)
Glucose, Bld: 292 mg/dL — ABNORMAL HIGH (ref 65–99)
Potassium: 4 mmol/L (ref 3.5–5.1)
Sodium: 137 mmol/L (ref 135–145)
Total Bilirubin: 0.9 mg/dL (ref 0.3–1.2)
Total Protein: 7.5 g/dL (ref 6.5–8.1)

## 2017-04-15 LAB — LIPASE, BLOOD: Lipase: 45 U/L (ref 11–51)

## 2017-04-15 LAB — CBC
HCT: 35.2 % — ABNORMAL LOW (ref 39.0–52.0)
Hemoglobin: 12.3 g/dL — ABNORMAL LOW (ref 13.0–17.0)
MCH: 34.8 pg — ABNORMAL HIGH (ref 26.0–34.0)
MCHC: 34.9 g/dL (ref 30.0–36.0)
MCV: 99.7 fL (ref 78.0–100.0)
Platelets: 161 10*3/uL (ref 150–400)
RBC: 3.53 MIL/uL — ABNORMAL LOW (ref 4.22–5.81)
RDW: 13.1 % (ref 11.5–15.5)
WBC: 9.9 10*3/uL (ref 4.0–10.5)

## 2017-04-15 LAB — URINALYSIS, ROUTINE W REFLEX MICROSCOPIC
Bacteria, UA: NONE SEEN
Bilirubin Urine: NEGATIVE
Glucose, UA: >=500 mg/dL — AB
Ketones, ur: NEGATIVE mg/dL
Leukocytes, UA: NEGATIVE
Nitrite: NEGATIVE
Protein, ur: 30 mg/dL — AB
Specific Gravity, Urine: 1.022 (ref 1.005–1.030)
pH: 5 (ref 5.0–8.0)

## 2017-04-15 LAB — POCT URINALYSIS DIPSTICK
Spec Grav, UA: 1.02 (ref 1.010–1.025)
pH, UA: 6 (ref 5.0–8.0)

## 2017-04-15 MED ORDER — OXYCODONE-ACETAMINOPHEN 5-325 MG PO TABS: 1.0000 | ORAL_TABLET | ORAL | 0 refills | Status: DC | PRN

## 2017-04-15 MED ORDER — IOPAMIDOL (ISOVUE-300) INJECTION 61%
80.0000 mL | Freq: Once | INTRAVENOUS | Status: AC | PRN
  Administered 2017-04-15: 80 mL via INTRAVENOUS

## 2017-04-15 MED ORDER — ONDANSETRON 4 MG PO TBDP: 4.0000 mg | ORAL_TABLET | Freq: Three times a day (TID) | ORAL | 0 refills | Status: DC | PRN

## 2017-04-15 MED ORDER — SODIUM CHLORIDE 0.9 % IV BOLUS (SEPSIS)
1000.0000 mL | Freq: Once | INTRAVENOUS | Status: AC
  Administered 2017-04-15: 1000 mL via INTRAVENOUS

## 2017-04-15 NOTE — Telephone Encounter (Signed)
Patient thinks he has a kidney stone and wanting something called in to help him pass it to [facility] [place]

## 2017-04-15 NOTE — Progress Notes (Signed)
   Subjective:    Patient ID: Jack Mejia, male    DOB: [birth year], 71 y.o.   MRN: [id number]  HPI  Patient arrives with c/o right sided lower abdominal pain that started abruptly today- Patient has history of kidney stones.  States he may have had some fever.  Pain is severe. Primarily right lower quadrant. Patient has lost appetite. Minimal radiation of pain. No obvious dysuria.  Positive history of kidney stones.  Patient states he was up most of the night last night with severe pain.  Review of Systems No chest pain no shortness of breath no vomiting no change in bowel habits    Objective:   Physical Exam Alert active in some distress holding his abdomen. Remaining still. HEENT normal. Lungs clear. Heart rare rhythm. Abdomen bowel sounds present distinct right lower quadrant tenderness no rebound no guarding   Urinalysis no red blood cells no white blood cells    Assessment & Plan:  Impression sudden onset of severe abdominal pain. No hematuria present. Despite pain patient is remaining still, unlike classic colicky ureteral stone presentation. With sudden onset of pain. Severity of pain. Age with multiple risk factors. Needs an urgent workup. Advised patient this. Spoke with emergency room. Sent to ER.  Greater than 50% of this 25 minute face to face visit was spent in counseling and discussion and coordination of care regarding the above diagnosis/diagnosies

## 2017-04-15 NOTE — ED Triage Notes (Signed)
Pt comes in with RLQ abdominal pain starting this morning. Denies any n/v. States he has been having many loose stools but this is his norm. NAD noted

## 2017-04-15 NOTE — Telephone Encounter (Signed)
Spoke with patient and informed him per Dr.Steve Luking- recommend office visit before presuming diagnoses of kidney stone. Patient verbalized understanding and was transferred to front desk to scheduled office visit.

## 2017-04-15 NOTE — Telephone Encounter (Signed)
Should be seen before presuming that dx

## 2017-04-15 NOTE — Telephone Encounter (Signed)
Spoke with patient and patient stated that he is having right flank pain, with pain to his lower abdomen on the right side, and urinary hesitancy. Patient denies fever but states he has hot flashes periodically. Please advise?

## 2017-04-15 NOTE — ED Provider Notes (Signed)
Home DEPT Provider Note   CSN: [id number] Arrival date & time: 04/15/17  1710     History   Chief Complaint Chief Complaint  Patient presents with  . Abdominal Pain    HPI ROHAAN DURNIL is a 71 y.o. male.  Pt presents to the ED today with RLQ abdominal pain.  He said it's been going on since this morning.  He denies any f/c or n/v.  The pt said his pain has improved, but it is still there.  Pt has not taken any meds for his sx.      Past Medical History:  Diagnosis Date  . Arthritis   . Cramps of left lower extremity   . Depression   . Diabetes mellitus    type 2 for 7-8 yrs  . Dyspnea   . Elevated liver enzymes   . Fatty liver   . History of kidney stones   . Hyperlipidemia   . Hypertension   . Vertigo     Patient Active Problem List   Diagnosis Date Noted  . Depression, major, single episode, mild ([place]) 03/01/2017  . Lumbar stenosis with neurogenic claudication 08/06/2016  . Erectile dysfunction 12/05/2012  . Sensory neuropathy 12/05/2012  . Hyperlipidemia LDL goal <100 12/05/2012  . Rectal bleeding 10/13/2012  . Hypertension 07/07/2011  . Diabetes mellitus ([place]) 07/07/2011  . Fatty liver 07/07/2011    Past Surgical History:  Procedure Laterality Date  . CARPAL TUNNEL RELEASE     both wrist  . cataract surgery     bilateral  . CHOLECYSTECTOMY    . COLON SURGERY    . EYE SURGERY    . LUMBAR LAMINECTOMY/DECOMPRESSION MICRODISCECTOMY N/A 08/06/2016   Procedure: LUMBAR THREE- LUMBAR FIVE  DECOMPRESSIVE LUMBAR LAMINECTOMY;  Surgeon: Jovita Gamma, MD;  Location: [facility];  Service: Neurosurgery;  Laterality: N/A;       Home Medications    Prior to Admission medications   Medication Sig Start Date End Date Taking? Authorizing Provider  ACCU-CHEK AVIVA PLUS test strip USE ONE STRIP TO CHECK GLUCOSE ONCE DAILY AS DIRECTED 08/31/16   [name], MD  aspirin EC 81 MG tablet Take 1 tablet (81 mg total) by mouth daily. 03/02/17   [name], MD  glipiZIDE (GLUCOTROL) 5 MG tablet Take 2 tablets po BID 03/01/17   [name], MD  glucose blood (RELION ULTIMA TEST) test strip Use as instructed 02/21/14   [name], MD  hydrochlorothiazide (HYDRODIURIL) 25 MG tablet Take 1 tablet (25 mg total) by mouth daily. 03/01/17   [name], MD  loratadine (CLARITIN) 10 MG tablet Take 10 mg by mouth daily.    [provider]  losartan (COZAAR) 50 MG tablet TAKE 1 TABLET BY MOUTH TWICE A DAY 03/01/17   [name], MD  metFORMIN (GLUCOPHAGE) 500 MG tablet Take 2 tablets (1,000 mg total) by mouth 2 (two) times daily. 03/01/17   [name], MD  naproxen sodium (ANAPROX) 220 MG tablet Take 220 mg by mouth 2 (two) times daily as needed.    [provider]  ondansetron (ZOFRAN ODT) 4 MG disintegrating tablet Take 1 tablet (4 mg total) by mouth every 8 (eight) hours as needed. 04/15/17   Isla Pence, MD  oxyCODONE-acetaminophen (PERCOCET/ROXICET) 5-325 MG tablet Take 1-2 tablets by mouth every 4 (four) hours as needed for severe pain. 04/15/17   Isla Pence, MD  rosuvastatin (CRESTOR) 20 MG tablet Take 1 tablet (20 mg total)  by mouth daily. 03/26/17 06/24/17  Herminio Commons, MD    Family History Family History  Problem Relation Age of Onset  . Diabetes Mother   . Cancer Mother   . Diabetes Sister   . Diabetes Maternal Grandmother     Social History Social History  Substance Use Topics  . Smoking status: Former Smoker    Types: Cigarettes    Quit date: 10/14/1982  . Smokeless tobacco: Never Used     Comment: 29 yrs ago  . Alcohol use No     Allergies   Demerol and Vasotec [enalapril]   Review of Systems Review of Systems  Gastrointestinal: Positive for abdominal pain.  All other systems reviewed and are negative.    Physical Exam Updated Vital Signs BP (!) 142/64   Pulse 75   Temp 98.4 F (36.9 C) (Oral)   Resp 18   Ht 5\' 9"  (1.753 m)   Wt 101.6 kg (224 lb)    SpO2 97%   BMI 33.08 kg/m   Physical Exam  Constitutional: He is oriented to person, place, and time. He appears well-developed and well-nourished.  HENT:  Head: Normocephalic and atraumatic.  Right Ear: External ear normal.  Left Ear: External ear normal.  Nose: Nose normal.  Mouth/Throat: Oropharynx is clear and moist.  Eyes: Pupils are equal, round, and reactive to light. Conjunctivae and EOM are normal.  Neck: Normal range of motion. Neck supple.  Cardiovascular: Normal rate, regular rhythm, normal heart sounds and intact distal pulses.   Pulmonary/Chest: Effort normal and breath sounds normal.  Abdominal: Soft. Bowel sounds are normal. There is tenderness in the right lower quadrant.  Musculoskeletal: Normal range of motion.  Neurological: He is alert and oriented to person, place, and time.  Skin: Skin is warm.  Psychiatric: He has a normal mood and affect. His behavior is normal. Judgment and thought content normal.  Nursing note and vitals reviewed.    ED Treatments / Results  Labs (all labs ordered are listed, but only abnormal results are displayed) Labs Reviewed  COMPREHENSIVE METABOLIC PANEL - Abnormal; Notable for the following:       Result Value   Chloride 100 (*)    Glucose, Bld 292 (*)    BUN 25 (*)    Creatinine, Ser 1.77 (*)    GFR calc non Af Amer 37 (*)    GFR calc Af Amer 43 (*)    All other components within normal limits  CBC - Abnormal; Notable for the following:    RBC 3.53 (*)    Hemoglobin 12.3 (*)    HCT 35.2 (*)    MCH 34.8 (*)    All other components within normal limits  URINALYSIS, ROUTINE W REFLEX MICROSCOPIC - Abnormal; Notable for the following:    Glucose, UA >=500 (*)    Hgb urine dipstick SMALL (*)    Protein, ur 30 (*)    Squamous Epithelial / LPF 0-5 (*)    All other components within normal limits  LIPASE, BLOOD    EKG  EKG Interpretation None       Radiology Ct Abdomen Pelvis W Contrast  Result Date:  04/15/2017 CLINICAL DATA:  Right lower quadrant abdominal pain. EXAM: CT ABDOMEN AND PELVIS WITH CONTRAST TECHNIQUE: Multidetector CT imaging of the abdomen and pelvis was performed using the standard protocol following bolus administration of intravenous contrast. CONTRAST:  62mL ISOVUE-300 IOPAMIDOL (ISOVUE-300) INJECTION 61% COMPARISON:  06/19/2014 FINDINGS: Lower chest:  Unremarkable. Hepatobiliary: No focal  abnormality within the liver parenchyma. Gallbladder surgically absent. No intrahepatic or extrahepatic biliary dilation. Pancreas: No focal mass lesion. No dilatation of the main duct. No intraparenchymal cyst. No peripancreatic edema. Spleen: No splenomegaly. No focal mass lesion. Adrenals/Urinary Tract: No adrenal nodule or mass. Four stones are seen in the lower pole the right kidney. Two of these measure about 4 mm. One measures 1-2 mm and the largest measures 3 x 7 mm. 15 mm cyst in the lower pole is similar to prior. 16 mm interpolar cyst is new since prior. Decreased perfusion noted in the right kidney compared to the left. Moderate right hydroureteronephrosis is identified with distended right ureter extending to the level of the right UVJ. 2-3 mm stone is seen dependently in the distal right ureter with another 1 mm stone seen in the proximal right UVJ, another punctate stone in the mid UVJ and a dominant 5 x 5 x 9 mm stone in the distal right UVJ. No stones are seen in the left kidney. 2 interpolar left renal cysts are stable. Left ureter is normal in appearance. Bladder is nondistended Stomach/Bowel: Stomach is nondistended. No gastric wall thickening. No evidence of outlet obstruction. Duodenum is normally positioned as is the ligament of Treitz. No small bowel wall thickening. No small bowel dilatation. The terminal ileum is normal. The appendix is normal. No gross colonic mass. No colonic wall thickening. No substantial diverticular change. Vascular/Lymphatic: There is abdominal aortic  atherosclerosis without aneurysm. There is no gastrohepatic or hepatoduodenal ligament lymphadenopathy. No intraperitoneal or retroperitoneal lymphadenopathy. No pelvic sidewall lymphadenopathy. Reproductive: Prostate gland appears mildly enlarged. Other: No intraperitoneal free fluid. Musculoskeletal: Bone windows reveal no worrisome lytic or sclerotic osseous lesions. IMPRESSION: 1. Right-sided moderate hydronephrosis with evidence of obstructive uropathy. Urinary obstruction is due to a 5 x 5 x 9 mm stone in the distal right UVJ. Proximal to this dominant stone, 3 additional tiny stones are seen in the distal right ureter and more proximal portions of the UVJ. 2. Nonobstructing stones in the lower pole the right kidney. 3. Bilateral renal cysts. 4. Prostatomegaly. 5.  Aortic Atherosclerois (ICD10-170.0) Electronically Signed   By: Misty Stanley M.D.   On: 04/15/2017 20:24    Procedures Procedures (including critical care time)  Medications Ordered in ED Medications  sodium chloride 0.9 % bolus 1,000 mL (1,000 mLs Intravenous New Bag/Given 04/15/17 1936)  iopamidol (ISOVUE-300) 61 % injection 80 mL (80 mLs Intravenous Contrast Given 04/15/17 1952)     Initial Impression / Assessment and Plan / ED Course  I have reviewed the triage vital signs and the nursing notes.  Pertinent labs & imaging results that were available during my care of the patient were reviewed by me and considered in my medical decision making (see chart for details).    Pt denies any current pain.  He did not receive any pain meds here and does not want them.   He is told to f/u with urology.  He knows to return if worse.  Final Clinical Impressions(s) / ED Diagnoses   Final diagnoses:  Ureteral stone with hydronephrosis    New Prescriptions New Prescriptions   ONDANSETRON (ZOFRAN ODT) 4 MG DISINTEGRATING TABLET    Take 1 tablet (4 mg total) by mouth every 8 (eight) hours as needed.   OXYCODONE-ACETAMINOPHEN  (PERCOCET/ROXICET) 5-325 MG TABLET    Take 1-2 tablets by mouth every 4 (four) hours as needed for severe pain.     Isla Pence, MD 04/15/17 2035

## 2017-04-16 DIAGNOSIS — N201 Calculus of ureter: Secondary | ICD-10-CM | POA: Diagnosis not present

## 2017-04-16 DIAGNOSIS — N202 Calculus of kidney with calculus of ureter: Secondary | ICD-10-CM | POA: Diagnosis not present

## 2017-04-23 DIAGNOSIS — N201 Calculus of ureter: Secondary | ICD-10-CM | POA: Diagnosis not present

## 2017-05-17 ENCOUNTER — Ambulatory Visit: Payer: Medicare HMO | Admitting: Cardiovascular Disease

## 2017-08-02 ENCOUNTER — Ambulatory Visit (INDEPENDENT_AMBULATORY_CARE_PROVIDER_SITE_OTHER): Payer: Medicare HMO | Admitting: Family Medicine

## 2017-08-02 ENCOUNTER — Encounter: Payer: Self-pay | Admitting: Family Medicine

## 2017-08-02 VITALS — BP 160/80 | Ht 69.5 in | Wt 222.0 lb

## 2017-08-02 DIAGNOSIS — E785 Hyperlipidemia, unspecified: Secondary | ICD-10-CM | POA: Diagnosis not present

## 2017-08-02 DIAGNOSIS — E0800 Diabetes mellitus due to underlying condition with hyperosmolarity without nonketotic hyperglycemic-hyperosmolar coma (NKHHC): Secondary | ICD-10-CM

## 2017-08-02 DIAGNOSIS — M79605 Pain in left leg: Secondary | ICD-10-CM | POA: Diagnosis not present

## 2017-08-02 DIAGNOSIS — M79604 Pain in right leg: Secondary | ICD-10-CM

## 2017-08-02 DIAGNOSIS — I10 Essential (primary) hypertension: Secondary | ICD-10-CM

## 2017-08-02 MED ORDER — CYCLOBENZAPRINE HCL 10 MG PO TABS: 10.0000 mg | ORAL_TABLET | Freq: Three times a day (TID) | ORAL | 5 refills | Status: DC | PRN

## 2017-08-02 NOTE — Progress Notes (Signed)
   Subjective:    Patient ID: Jack Mejia, male    DOB: April 27, 1946, 72 y.o.   MRN: [id number]  HPI  Patient is here today with complaints of bilateral leg cramping , he also says his knees hurt. He wants to speak with you to see if the Metformin could be causing.  Cramps at night real bad, wakes pt up, gets wotn out  Not exercising much because of leg cramps.  Left leg worse than right.  No claudication with exertion   .htm  Blood pressure medicine and blood pressure levels reviewed today with patient. Compliant with blood pressure medicine. States does not miss a dose. No obvious side effects. Blood pressure generally good when checked elsewhere. Watching salt intake.   Patient claims compliance with diabetes medication. No obvious side effects. Reports no substantial low sugar spells. Most numbers are generally in good range when checked fasting. Generally does not miss a dose of medication. Watching diabetic diet closely  Patient stopped Crestor.  Started by cardiologist.  Kristeen Miss it was definitely making his leg pain worse.    Review of Systems No headache, no major weight loss or weight gain, no chest pain no back pain abdominal pain no change in bowel habits complete ROS otherwise negative     Objective:   Physical Exam  Alert and oriented, vitals reviewed and stable, NAD ENT-TM's and ext canals WNL bilat via otoscopic exam Soft palate, tonsils and post pharynx WNL via oropharyngeal exam Neck-symmetric, no masses; thyroid nonpalpable and nontender Pulmonary-no tachypnea or accessory muscle use; Clear without wheezes via auscultation Card--no abnrml murmurs, rhythm reg and rate WNL Carotid pulses symmetric, without bruits Feet pulses excellent bilateral.  Sensation intact.  Knees no particular crepitation or effusion no obvious calf tenderness.  Impression #1 hyperlipidemia.  Patient stopped Crestor due to perceived  2.  Hypertension good control discussed  3.  Type 2  diabetes.  Patient wondered if the Metformin causing leg cramps and I advised no.  Besides patient testable needs this medicine since he is refusing additional costly diabetes meds.  4.  Leg pain.  Multifactorial.  Was sciatica equivalent had lumbar stenosis surgery and this helped.  Now will related to cramps in legs.  Which are primarily nocturnal plan trial of Flexeril nightly      Assessment & Plan:

## 2017-08-29 ENCOUNTER — Other Ambulatory Visit: Payer: Self-pay | Admitting: Family Medicine

## 2017-09-11 ENCOUNTER — Other Ambulatory Visit: Payer: Self-pay | Admitting: Family Medicine

## 2017-09-23 ENCOUNTER — Ambulatory Visit: Payer: Medicare HMO | Admitting: Family Medicine

## 2017-10-13 ENCOUNTER — Other Ambulatory Visit: Payer: Self-pay | Admitting: Family Medicine

## 2017-11-01 ENCOUNTER — Encounter: Payer: Self-pay | Admitting: Family Medicine

## 2017-11-01 ENCOUNTER — Ambulatory Visit (INDEPENDENT_AMBULATORY_CARE_PROVIDER_SITE_OTHER): Payer: Medicare HMO | Admitting: Family Medicine

## 2017-11-01 VITALS — BP 142/76 | Ht 69.5 in | Wt 219.0 lb

## 2017-11-01 DIAGNOSIS — I10 Essential (primary) hypertension: Secondary | ICD-10-CM

## 2017-11-01 DIAGNOSIS — E118 Type 2 diabetes mellitus with unspecified complications: Secondary | ICD-10-CM

## 2017-11-01 DIAGNOSIS — E785 Hyperlipidemia, unspecified: Secondary | ICD-10-CM

## 2017-11-01 DIAGNOSIS — E119 Type 2 diabetes mellitus without complications: Secondary | ICD-10-CM

## 2017-11-01 LAB — POCT GLYCOSYLATED HEMOGLOBIN (HGB A1C): Hemoglobin A1C: 6.8

## 2017-11-01 MED ORDER — LOSARTAN POTASSIUM 50 MG PO TABS: 50.0000 mg | ORAL_TABLET | Freq: Two times a day (BID) | ORAL | 1 refills | Status: DC

## 2017-11-01 MED ORDER — HYDROCHLOROTHIAZIDE 25 MG PO TABS: 25.0000 mg | ORAL_TABLET | Freq: Every day | ORAL | 1 refills | Status: DC

## 2017-11-01 MED ORDER — GLIPIZIDE 5 MG PO TABS: ORAL_TABLET | ORAL | 1 refills | Status: DC

## 2017-11-01 MED ORDER — METFORMIN HCL 500 MG PO TABS: 1000.0000 mg | ORAL_TABLET | Freq: Two times a day (BID) | ORAL | 1 refills | Status: DC

## 2017-11-01 NOTE — Progress Notes (Signed)
   Subjective:    Patient ID: Jack Mejia, male    DOB: Jul 01, 1946, 72 y.o.   MRN: [id number]  Diabetes  He presents for his follow-up diabetic visit. He has type 2 diabetes mellitus. He is compliant with treatment all of the time. He is following a generally unhealthy diet. (180's ) He sees a podiatrist.Eye exam is current.   Pt states no concerns today.   Results for orders placed or performed in visit on 11/01/17  POCT glycosylated hemoglobin (Hb A1C)  Result Value Ref Range   Hemoglobin A1C 6.8     Patient claims compliance with diabetes medication. No obvious side effects. Reports no substantial low sugar spells. Most numbers are generally in good range when checked fasting. Generally does not miss a dose of medication. Watching diabetic diet closely  Blood pressure medicine and blood pressure levels reviewed today with patient. Compliant with blood pressure medicine. States does not miss a dose. No obvious side effects. Blood pressure generally good when checked elsewhere. Watching salt intake.  Has inproved with the eting habits  Not exrcising a lot     Review of Systems No headache, no major weight loss or weight gain, no chest pain no back pain abdominal pain no change in bowel habits complete ROS otherwise negative     Objective:   Physical Exam  Alert and oriented, vitals reviewed and stable, NAD ENT-TM's and ext canals WNL bilat via otoscopic exam Soft palate, tonsils and post pharynx WNL via oropharyngeal exam Neck-symmetric, no masses; thyroid nonpalpable and nontender Pulmonary-no tachypnea or accessory muscle use; Clear without wheezes via auscultation Card--no abnrml murmurs, rhythm reg and rate WNL Carotid pulses symmetric, without bruits       Assessment & Plan:   Diabetes type 2.  Good control.  Compliance with medications discussed.  Diet discussed  2.  Hypertension.  Prior blood pressures reviewed discussed to maintain same therapy compliance  discussed  3.  Hyperlipidemia mild in nature.  Patient declines statins at this time discussed  Greater than 50% of this 25 minute face to face visit was spent in counseling and discussion and coordination of care regarding the above diagnosis/diagnosies

## 2018-01-03 ENCOUNTER — Other Ambulatory Visit: Payer: Self-pay | Admitting: Family Medicine

## 2018-01-04 DIAGNOSIS — R69 Illness, unspecified: Secondary | ICD-10-CM | POA: Diagnosis not present

## 2018-02-23 DIAGNOSIS — R69 Illness, unspecified: Secondary | ICD-10-CM | POA: Diagnosis not present

## 2018-02-24 DIAGNOSIS — R69 Illness, unspecified: Secondary | ICD-10-CM | POA: Diagnosis not present

## 2018-03-01 ENCOUNTER — Encounter: Payer: Self-pay | Admitting: Orthopaedic Surgery

## 2018-03-01 ENCOUNTER — Ambulatory Visit: Payer: Medicare HMO | Admitting: Orthopaedic Surgery

## 2018-03-01 ENCOUNTER — Ambulatory Visit (INDEPENDENT_AMBULATORY_CARE_PROVIDER_SITE_OTHER): Payer: Medicare HMO

## 2018-03-01 VITALS — BP 155/82 | HR 100 | Ht 70.0 in | Wt 210.0 lb

## 2018-03-01 DIAGNOSIS — M25562 Pain in left knee: Secondary | ICD-10-CM

## 2018-03-01 DIAGNOSIS — G8929 Other chronic pain: Secondary | ICD-10-CM

## 2018-03-01 DIAGNOSIS — M25561 Pain in right knee: Secondary | ICD-10-CM | POA: Diagnosis not present

## 2018-03-01 MED ORDER — NAPROXEN 500 MG PO TABS: 500.0000 mg | ORAL_TABLET | Freq: Two times a day (BID) | ORAL | 5 refills | Status: DC

## 2018-03-01 NOTE — Progress Notes (Signed)
Subjective:    Patient ID: Jack Mejia, male    DOB: 05-07-1946, 72 y.o.   MRN: [id number]  HPI He has had knee pain in both knees, more on the left for over six months to a year.  He had cramps but they have subsided.  He has no trauma,.  He has swelling and popping but no giving away.  He has no redness.  He has seen Dr. Wolfgang Phoenix for this.  I have reviewed his notes.  He has tried Advil, Aleve, ice, rest, heat, rubs with only slight help.     Review of Systems  Constitutional: Positive for activity change.  HENT: Positive for hearing loss.   Musculoskeletal: Positive for arthralgias, gait problem and joint swelling.  All other systems reviewed and are negative.  For Review of Systems, all other systems reviewed and are negative.  Past Medical History:  Diagnosis Date  . Arthritis   . Cramps of left lower extremity   . Depression   . Diabetes mellitus    type 2 for 7-8 yrs  . Dyspnea   . Elevated liver enzymes   . Fatty liver   . History of kidney stones   . Hyperlipidemia   . Hypertension   . Vertigo     Past Surgical History:  Procedure Laterality Date  . CARPAL TUNNEL RELEASE     both wrist  . cataract surgery     bilateral  . CHOLECYSTECTOMY    . COLON SURGERY    . EYE SURGERY    . LUMBAR LAMINECTOMY/DECOMPRESSION MICRODISCECTOMY N/A 08/06/2016   Procedure: LUMBAR THREE- LUMBAR FIVE  DECOMPRESSIVE LUMBAR LAMINECTOMY;  Surgeon: Jovita Gamma, MD;  Location: [facility];  Service: Neurosurgery;  Laterality: N/A;    Current Outpatient Medications on File Prior to Visit  Medication Sig Dispense Refill  . ACCU-CHEK AVIVA PLUS test strip USE ONE STRIP TO CHECK GLUCOSE ONCE DAILY AS DIRECTED 50 each 5  . aspirin EC 81 MG tablet Take 1 tablet (81 mg total) by mouth daily. 30 tablet 5  . cyclobenzaprine (FLEXERIL) 10 MG tablet Take 1 tablet (10 mg total) by mouth 3 (three) times daily as needed for muscle spasms. (Patient not taking: Reported on 11/01/2017) 30 tablet 5  .  glipiZIDE (GLUCOTROL) 5 MG tablet TAKE 2 TABLETS BY MOUTH TWICE A DAY 360 tablet 1  . glucose blood (RELION ULTIMA TEST) test strip Use as instructed 20 each prn  . hydrochlorothiazide (HYDRODIURIL) 25 MG tablet Take 1 tablet (25 mg total) by mouth daily. 90 tablet 1  . loratadine (CLARITIN) 10 MG tablet Take 10 mg by mouth daily.    [name] losartan (COZAAR) 50 MG tablet Take 1 tablet (50 mg total) by mouth 2 (two) times daily. 180 tablet 1  . metFORMIN (GLUCOPHAGE) 500 MG tablet Take 2 tablets (1,000 mg total) by mouth 2 (two) times daily. 360 tablet 1  . naproxen sodium (ANAPROX) 220 MG tablet Take 220 mg by mouth 2 (two) times daily as needed.    Glory Rosebush VERIO test strip USE ONE STRIP TO CHECK GLUCOSE ONCE DAILY OR AS DIRECTED BY MD 50 each 5   No current facility-administered medications on file prior to visit.     Social History   Socioeconomic History  . Marital status: Divorced    Spouse name: Not on file  . Number of children: Not on file  . Years of education: Not on file  . Highest education level: Not on file  Occupational History  . Not on file  Social Needs  . Financial resource strain: Not on file  . Food insecurity:    Worry: Not on file    Inability: Not on file  . Transportation needs:    Medical: Not on file    Non-medical: Not on file  Tobacco Use  . Smoking status: Former Smoker    Types: Cigarettes    Last attempt to quit: 10/14/1982    Years since quitting: 35.4  . Smokeless tobacco: Never Used  . Tobacco comment: 29 yrs ago  Substance and Sexual Activity  . Alcohol use: No  . Drug use: No  . Sexual activity: Never    Birth control/protection: None  Lifestyle  . Physical activity:    Days per week: Not on file    Minutes per session: Not on file  . Stress: Not on file  Relationships  . Social connections:    Talks on phone: Not on file    Gets together: Not on file    Attends religious service: Not on file    Active member of club or  organization: Not on file    Attends meetings of clubs or organizations: Not on file    Relationship status: Not on file  . Intimate partner violence:    Fear of current or ex partner: Not on file    Emotionally abused: Not on file    Physically abused: Not on file    Forced sexual activity: Not on file  Other Topics Concern  . Not on file  Social History Narrative  . Not on file    Family History  Problem Relation Age of Onset  . Diabetes Mother   . Cancer Mother   . Diabetes Sister   . Diabetes Maternal Grandmother     BP (!) 155/82   Pulse 100   Ht 5\' 10"  (1.778 m)   Wt 210 lb (95.3 kg)   BMI 30.13 kg/m   Body mass index is 30.13 kg/m.      Objective:   Physical Exam  Constitutional: He is oriented to person, place, and time. He appears well-developed and well-nourished.  HENT:  Head: Normocephalic and atraumatic.  Eyes: Pupils are equal, round, and reactive to light. Conjunctivae and EOM are normal.  Neck: Normal range of motion. Neck supple.  Cardiovascular: Normal rate, regular rhythm and intact distal pulses.  Pulmonary/Chest: Effort normal.  Abdominal: Soft.  Musculoskeletal:       Right knee: He exhibits decreased range of motion. Tenderness found. Medial joint line tenderness noted.       Left knee: He exhibits decreased range of motion. Tenderness found. Medial joint line tenderness noted.       Legs: Neurological: He is alert and oriented to person, place, and time. He has normal reflexes. He displays normal reflexes. No cranial nerve deficit. He exhibits normal muscle tone. Coordination normal.  Skin: Skin is warm and dry.  Psychiatric: He has a normal mood and affect. His behavior is normal. Judgment and thought content normal.     X-rays were done of both knee, reported separately.     Assessment & Plan:   Encounter Diagnosis  Name Primary?  . Chronic pain of both knees Yes   PROCEDURE NOTE:  The patient requests injections of the right  knee , verbal consent was obtained.  The right knee was prepped appropriately after time out was performed.   Sterile technique was observed and injection of 1 cc  of Depo-Medrol 40 mg with several cc's of plain xylocaine. Anesthesia was provided by ethyl chloride and a 20-gauge needle was used to inject the knee area. The injection was tolerated well.  A band aid dressing was applied.  The patient was advised to apply ice later today and tomorrow to the injection sight as needed.  PROCEDURE NOTE:  The patient requests injections of the left knee , verbal consent was obtained.  The left knee was prepped appropriately after time out was performed.   Sterile technique was observed and injection of 1 cc of Depo-Medrol 40 mg with several cc's of plain xylocaine. Anesthesia was provided by ethyl chloride and a 20-gauge needle was used to inject the knee area. The injection was tolerated well.  A band aid dressing was applied.  The patient was advised to apply ice later today and tomorrow to the injection sight as needed.  I have explained the x-rays taken, he has marked medial narrowing.    I will begin Naprosyn 500 po bid pc.  Return in two weeks.  Call if any problem.  Precautions discussed.   Electronically Signed Sanjuana Kava, MD 8/6/20198:42 AM

## 2018-03-07 ENCOUNTER — Ambulatory Visit (INDEPENDENT_AMBULATORY_CARE_PROVIDER_SITE_OTHER): Payer: Medicare HMO | Admitting: Otolaryngology

## 2018-03-07 DIAGNOSIS — H6121 Impacted cerumen, right ear: Secondary | ICD-10-CM | POA: Diagnosis not present

## 2018-03-07 DIAGNOSIS — H903 Sensorineural hearing loss, bilateral: Secondary | ICD-10-CM

## 2018-03-15 ENCOUNTER — Ambulatory Visit: Payer: Medicare HMO | Admitting: Orthopaedic Surgery

## 2018-03-15 ENCOUNTER — Encounter: Payer: Self-pay | Admitting: Orthopaedic Surgery

## 2018-03-15 VITALS — BP 147/71 | HR 81 | Temp 97.3°F | Ht 70.0 in | Wt 210.0 lb

## 2018-03-15 DIAGNOSIS — M25562 Pain in left knee: Secondary | ICD-10-CM

## 2018-03-15 DIAGNOSIS — M25561 Pain in right knee: Secondary | ICD-10-CM | POA: Diagnosis not present

## 2018-03-15 DIAGNOSIS — G8929 Other chronic pain: Secondary | ICD-10-CM | POA: Diagnosis not present

## 2018-03-15 NOTE — Progress Notes (Signed)
Patient Jack Mejia, male DOB:Feb 23, 1946, 72 y.o. FIE:[id number]  Chief Complaint  Patient presents with  . Knee Pain    bilateral     HPI  Jack Mejia is a 72 y.o. male who has pain of both knees.  The injections last time helped and he is taking the Naprosyn which also helps.  He still has pain but has less swelling and is able to sleep now.  He has less swelling.  He is walking better.     Body mass index is 30.13 kg/m.  ROS  Review of Systems  Constitutional: Positive for activity change.  HENT: Positive for hearing loss.   Musculoskeletal: Positive for arthralgias, gait problem and joint swelling.  All other systems reviewed and are negative.   All other systems reviewed and are negative.  The following is a summary of the past history medically, past history surgically, known current medicines, social history and family history.  This information is gathered electronically by the computer from prior information and documentation.  I review this each visit and have found including this information at this point in the chart is beneficial and informative.    Past Medical History:  Diagnosis Date  . Arthritis   . Cramps of left lower extremity   . Depression   . Diabetes mellitus    type 2 for 7-8 yrs  . Dyspnea   . Elevated liver enzymes   . Fatty liver   . History of kidney stones   . Hyperlipidemia   . Hypertension   . Vertigo     Past Surgical History:  Procedure Laterality Date  . CARPAL TUNNEL RELEASE     both wrist  . cataract surgery     bilateral  . CHOLECYSTECTOMY    . COLON SURGERY    . EYE SURGERY    . LUMBAR LAMINECTOMY/DECOMPRESSION MICRODISCECTOMY N/A 08/06/2016   Procedure: LUMBAR THREE- LUMBAR FIVE  DECOMPRESSIVE LUMBAR LAMINECTOMY;  Surgeon: Jovita Gamma, MD;  Location: [facility];  Service: Neurosurgery;  Laterality: N/A;    Family History  Problem Relation Age of Onset  . Diabetes Mother   . Cancer Mother   . Diabetes Sister    . Diabetes Maternal Grandmother     Social History Social History   Tobacco Use  . Smoking status: Former Smoker    Types: Cigarettes    Last attempt to quit: 10/14/1982    Years since quitting: 35.4  . Smokeless tobacco: Never Used  . Tobacco comment: 29 yrs ago  Substance Use Topics  . Alcohol use: No  . Drug use: No    Allergies  Allergen Reactions  . Demerol Nausea And Vomiting  . Vasotec [Enalapril] Cough    Current Outpatient Medications  Medication Sig Dispense Refill  . ACCU-CHEK AVIVA PLUS test strip USE ONE STRIP TO CHECK GLUCOSE ONCE DAILY AS DIRECTED 50 each 5  . aspirin EC 81 MG tablet Take 1 tablet (81 mg total) by mouth daily. 30 tablet 5  . glipiZIDE (GLUCOTROL) 5 MG tablet TAKE 2 TABLETS BY MOUTH TWICE A DAY 360 tablet 1  . glucose blood (RELION ULTIMA TEST) test strip Use as instructed 20 each prn  . hydrochlorothiazide (HYDRODIURIL) 25 MG tablet Take 1 tablet (25 mg total) by mouth daily. 90 tablet 1  . loratadine (CLARITIN) 10 MG tablet Take 10 mg by mouth daily.    [name] losartan (COZAAR) 50 MG tablet Take 1 tablet (50 mg total) by mouth 2 (two) times daily.  180 tablet 1  . metFORMIN (GLUCOPHAGE) 500 MG tablet Take 2 tablets (1,000 mg total) by mouth 2 (two) times daily. 360 tablet 1  . naproxen (NAPROSYN) 500 MG tablet Take 1 tablet (500 mg total) by mouth 2 (two) times daily with a meal. 60 tablet 5  . naproxen sodium (ANAPROX) 220 MG tablet Take 220 mg by mouth 2 (two) times daily as needed.    Glory Rosebush VERIO test strip USE ONE STRIP TO CHECK GLUCOSE ONCE DAILY OR AS DIRECTED BY MD 50 each 5  . cyclobenzaprine (FLEXERIL) 10 MG tablet Take 1 tablet (10 mg total) by mouth 3 (three) times daily as needed for muscle spasms. (Patient not taking: Reported on 11/01/2017) 30 tablet 5   No current facility-administered medications for this visit.      Physical Exam  Blood pressure (!) 147/71, pulse 81, temperature (!) 97.3 F (36.3 C), height 5\' 10"  (1.778  m), weight 210 lb (95.3 kg).  Constitutional: overall normal hygiene, normal nutrition, well developed, normal grooming, normal body habitus. Assistive device:none  Musculoskeletal: gait and station Limp none, muscle tone and strength are normal, no tremors or atrophy is present.  .  Neurological: coordination overall normal.  Deep tendon reflex/nerve stretch intact.  Sensation normal.  Cranial nerves II-XII intact.   Skin:   Normal overall no scars, lesions, ulcers or rashes. No psoriasis.  Psychiatric: Alert and oriented x 3.  Recent memory intact, remote memory unclear.  Normal mood and affect. Well groomed.  Good eye contact.  Cardiovascular: overall no swelling, no varicosities, no edema bilaterally, normal temperatures of the legs and arms, no clubbing, cyanosis and good capillary refill.  Lymphatic: palpation is normal.  Both knees have slight effusion but less than last time.  He has ROM on the right of 0 to 115 and the left 0 to 110.  He has no limp. NV is intact.  All other systems reviewed and are negative   The patient has been educated about the nature of the problem(s) and counseled on treatment options.  The patient appeared to understand what I have discussed and is in agreement with it.  Encounter Diagnosis  Name Primary?  . Chronic pain of both knees Yes    PLAN Call if any problems.  Precautions discussed.  Continue current medications.   Return to clinic 3 weeks   Electronically Signed Sanjuana Kava, MD 8/20/20198:35 AM

## 2018-04-05 ENCOUNTER — Ambulatory Visit: Payer: Medicare HMO | Admitting: Orthopaedic Surgery

## 2018-04-05 ENCOUNTER — Encounter: Payer: Self-pay | Admitting: Orthopaedic Surgery

## 2018-04-05 VITALS — BP 130/76 | HR 97 | Ht 70.0 in | Wt 210.0 lb

## 2018-04-05 DIAGNOSIS — M25562 Pain in left knee: Secondary | ICD-10-CM

## 2018-04-05 DIAGNOSIS — M25561 Pain in right knee: Secondary | ICD-10-CM

## 2018-04-05 DIAGNOSIS — G8929 Other chronic pain: Secondary | ICD-10-CM | POA: Diagnosis not present

## 2018-04-05 NOTE — Progress Notes (Signed)
Patient Jack Mejia, male DOB:1946-03-07, 72 y.o. [id number]  Chief Complaint  Patient presents with  . Knee Pain    Bilateral knee pain.    HPI  Jack Mejia is a 72 y.o. male who has chronic pain of both knees. He has good and bad days but much more good days recently.  He takes the Naprosyn with no problem.  He says it helps.  He has swelling and popping.  He has no new trauma.   Body mass index is 30.13 kg/m.  ROS  Review of Systems  Constitutional: Positive for activity change.  HENT: Positive for hearing loss.   Musculoskeletal: Positive for arthralgias, gait problem and joint swelling.  All other systems reviewed and are negative.   All other systems reviewed and are negative.  The following is a summary of the past history medically, past history surgically, known current medicines, social history and family history.  This information is gathered electronically by the computer from prior information and documentation.  I review this each visit and have found including this information at this point in the chart is beneficial and informative.    Past Medical History:  Diagnosis Date  . Arthritis   . Cramps of left lower extremity   . Depression   . Diabetes mellitus    type 2 for 7-8 yrs  . Dyspnea   . Elevated liver enzymes   . Fatty liver   . History of kidney stones   . Hyperlipidemia   . Hypertension   . Vertigo     Past Surgical History:  Procedure Laterality Date  . CARPAL TUNNEL RELEASE     both wrist  . cataract surgery     bilateral  . CHOLECYSTECTOMY    . COLON SURGERY    . EYE SURGERY    . LUMBAR LAMINECTOMY/DECOMPRESSION MICRODISCECTOMY N/A 08/06/2016   Procedure: LUMBAR THREE- LUMBAR FIVE  DECOMPRESSIVE LUMBAR LAMINECTOMY;  Surgeon: Jovita Gamma, MD;  Location: [facility];  Service: Neurosurgery;  Laterality: N/A;    Family History  Problem Relation Age of Onset  . Diabetes Mother   . Cancer Mother   . Diabetes Sister   .  Diabetes Maternal Grandmother     Social History Social History   Tobacco Use  . Smoking status: Former Smoker    Types: Cigarettes    Last attempt to quit: 10/14/1982    Years since quitting: 35.4  . Smokeless tobacco: Never Used  . Tobacco comment: 29 yrs ago  Substance Use Topics  . Alcohol use: No  . Drug use: No    Allergies  Allergen Reactions  . Demerol Nausea And Vomiting  . Vasotec [Enalapril] Cough    Current Outpatient Medications  Medication Sig Dispense Refill  . ACCU-CHEK AVIVA PLUS test strip USE ONE STRIP TO CHECK GLUCOSE ONCE DAILY AS DIRECTED 50 each 5  . aspirin EC 81 MG tablet Take 1 tablet (81 mg total) by mouth daily. 30 tablet 5  . cyclobenzaprine (FLEXERIL) 10 MG tablet Take 1 tablet (10 mg total) by mouth 3 (three) times daily as needed for muscle spasms. (Patient not taking: Reported on 11/01/2017) 30 tablet 5  . glipiZIDE (GLUCOTROL) 5 MG tablet TAKE 2 TABLETS BY MOUTH TWICE A DAY 360 tablet 1  . glucose blood (RELION ULTIMA TEST) test strip Use as instructed 20 each prn  . hydrochlorothiazide (HYDRODIURIL) 25 MG tablet Take 1 tablet (25 mg total) by mouth daily. 90 tablet 1  . loratadine (CLARITIN)  10 MG tablet Take 10 mg by mouth daily.    [name] losartan (COZAAR) 50 MG tablet Take 1 tablet (50 mg total) by mouth 2 (two) times daily. 180 tablet 1  . metFORMIN (GLUCOPHAGE) 500 MG tablet Take 2 tablets (1,000 mg total) by mouth 2 (two) times daily. 360 tablet 1  . naproxen (NAPROSYN) 500 MG tablet Take 1 tablet (500 mg total) by mouth 2 (two) times daily with a meal. 60 tablet 5  . naproxen sodium (ANAPROX) 220 MG tablet Take 220 mg by mouth 2 (two) times daily as needed.    Glory Rosebush VERIO test strip USE ONE STRIP TO CHECK GLUCOSE ONCE DAILY OR AS DIRECTED BY MD 50 each 5   No current facility-administered medications for this visit.      Physical Exam  Blood pressure 130/76, pulse 97, height 5\' 10"  (1.778 m), weight 210 lb (95.3  kg).  Constitutional: overall normal hygiene, normal nutrition, well developed, normal grooming, normal body habitus. Assistive device:none  Musculoskeletal: gait and station Limp none, muscle tone and strength are normal, no tremors or atrophy is present.  .  Neurological: coordination overall normal.  Deep tendon reflex/nerve stretch intact.  Sensation normal.  Cranial nerves II-XII intact.   Skin:   Normal overall no scars, lesions, ulcers or rashes. No psoriasis.  Psychiatric: Alert and oriented x 3.  Recent memory intact, remote memory unclear.  Normal mood and affect. Well groomed.  Good eye contact.  Cardiovascular: overall no swelling, no varicosities, no edema bilaterally, normal temperatures of the legs and arms, no clubbing, cyanosis and good capillary refill.  Lymphatic: palpation is normal.  Both knees have crepitus.  Both knees have no effusion today and he walks well.  He has ROM of the right of 0-115 and the left 0 to 110.  NV intact.  All other systems reviewed and are negative   The patient has been educated about the nature of the problem(s) and counseled on treatment options.  The patient appeared to understand what I have discussed and is in agreement with it.  Encounter Diagnosis  Name Primary?  . Chronic pain of both knees Yes    PLAN Call if any problems.  Precautions discussed.  Continue current medications.   Return to clinic as needed per his request.   Electronically Signed Sanjuana Kava, MD 9/10/20199:31 AM

## 2018-04-13 DIAGNOSIS — R69 Illness, unspecified: Secondary | ICD-10-CM | POA: Diagnosis not present

## 2018-04-14 DIAGNOSIS — R69 Illness, unspecified: Secondary | ICD-10-CM | POA: Diagnosis not present

## 2018-05-03 ENCOUNTER — Encounter: Payer: Self-pay | Admitting: Family Medicine

## 2018-05-03 ENCOUNTER — Ambulatory Visit (INDEPENDENT_AMBULATORY_CARE_PROVIDER_SITE_OTHER): Payer: Medicare HMO | Admitting: Family Medicine

## 2018-05-03 VITALS — BP 148/88 | Ht 70.0 in | Wt 213.0 lb

## 2018-05-03 DIAGNOSIS — I10 Essential (primary) hypertension: Secondary | ICD-10-CM

## 2018-05-03 DIAGNOSIS — Z0000 Encounter for general adult medical examination without abnormal findings: Secondary | ICD-10-CM

## 2018-05-03 DIAGNOSIS — E785 Hyperlipidemia, unspecified: Secondary | ICD-10-CM

## 2018-05-03 DIAGNOSIS — Z125 Encounter for screening for malignant neoplasm of prostate: Secondary | ICD-10-CM

## 2018-05-03 DIAGNOSIS — E119 Type 2 diabetes mellitus without complications: Secondary | ICD-10-CM

## 2018-05-03 LAB — POCT GLYCOSYLATED HEMOGLOBIN (HGB A1C): Hemoglobin A1C: 6.5 % — AB (ref 4.0–5.6)

## 2018-05-03 MED ORDER — AMLODIPINE BESYLATE 5 MG PO TABS: 5.0000 mg | ORAL_TABLET | Freq: Every day | ORAL | 1 refills | Status: DC

## 2018-05-03 MED ORDER — METFORMIN HCL 500 MG PO TABS: 1000.0000 mg | ORAL_TABLET | Freq: Two times a day (BID) | ORAL | 1 refills | Status: DC

## 2018-05-03 MED ORDER — HYDROCHLOROTHIAZIDE 25 MG PO TABS: 25.0000 mg | ORAL_TABLET | Freq: Every day | ORAL | 1 refills | Status: DC

## 2018-05-03 MED ORDER — LOSARTAN POTASSIUM 50 MG PO TABS: 50.0000 mg | ORAL_TABLET | Freq: Two times a day (BID) | ORAL | 1 refills | Status: DC

## 2018-05-03 MED ORDER — GLIPIZIDE 5 MG PO TABS: ORAL_TABLET | ORAL | 1 refills | Status: DC

## 2018-05-03 NOTE — Progress Notes (Signed)
   Subjective:    Patient ID: Jack Mejia, male    DOB: 12/21/1945, 72 y.o.   MRN: [id number]  HPI  The patient comes in today for a wellness visit.    A review of their health history was completed.  A review of medications was also completed.  Any needed refills; yes  Mini cog- pass  Eating habits: eating whatever he wants  Falls/  MVA accidents in past few months: none  Regular exercise: no  Specialist pt sees on regular basis: ortho for knees  Preventative health issues were discussed.   Additional concerns: knee pain  Results for orders placed or performed in visit on 05/03/18  POCT glycosylated hemoglobin (Hb A1C)  Result Value Ref Range   Hemoglobin A1C 6.5 (A) 4.0 - 5.6 %   HbA1c POC (<> result, manual entry)     HbA1c, POC (prediabetic range)     HbA1c, POC (controlled diabetic range)     Patient claims compliance with diabetes medication. No obvious side effects. Reports no substantial low sugar spells. Most numbers are generally in good range when checked fasting. Generally does not miss a dose of medication. Watching diabetic diet closely  Blood pressure medicine and blood pressure levels reviewed today with patient. Compliant with blood pressure medicine. States does not miss a dose. No obvious side effects. Blood pressure generally good when checked elsewhere. Watching salt intake.  Not walking much     Review of Systems  Constitutional: Negative for activity change, appetite change and fever.  HENT: Negative for congestion and rhinorrhea.   Eyes: Negative for discharge.  Respiratory: Negative for cough and wheezing.   Cardiovascular: Negative for chest pain.  Gastrointestinal: Negative for abdominal pain, blood in stool and vomiting.  Genitourinary: Negative for difficulty urinating and frequency.  Musculoskeletal: Negative for neck pain.  Skin: Negative for rash.  Allergic/Immunologic: Negative for environmental allergies and food allergies.    Neurological: Negative for weakness and headaches.  Psychiatric/Behavioral: Negative for agitation.  All other systems reviewed and are negative.      Objective:   Physical Exam  Constitutional: He appears well-developed and well-nourished.  HENT:  Head: Normocephalic and atraumatic.  Right Ear: External ear normal.  Left Ear: External ear normal.  Nose: Nose normal.  Mouth/Throat: Oropharynx is clear and moist.  Eyes: Pupils are equal, round, and reactive to light. EOM are normal.  Neck: Normal range of motion. Neck supple. No thyromegaly present.  Cardiovascular: Normal rate, regular rhythm and normal heart sounds.  No murmur heard. Pulmonary/Chest: Effort normal and breath sounds normal. No respiratory distress. He has no wheezes.  Abdominal: Soft. Bowel sounds are normal. He exhibits no distension and no mass. There is no tenderness.  Genitourinary: Penis normal.  Musculoskeletal: Normal range of motion. He exhibits no edema.  Lymphadenopathy:    He has no cervical adenopathy.  Neurological: He is alert. He exhibits normal muscle tone.  Skin: Skin is warm and dry. No erythema.  Psychiatric: He has a normal mood and affect. His behavior is normal. Judgment normal.  Vitals reviewed.         Assessment & Plan:  Wellness, pt declines all vaccines, declines colonoscopy, diet exercise disc and encouraged  Type 2 diab good control cst, diet ex disc  htn subopt, claims compliance will add norvasc rational disc  f u six  mpnths

## 2018-05-04 DIAGNOSIS — E785 Hyperlipidemia, unspecified: Secondary | ICD-10-CM | POA: Diagnosis not present

## 2018-05-04 DIAGNOSIS — Z125 Encounter for screening for malignant neoplasm of prostate: Secondary | ICD-10-CM | POA: Diagnosis not present

## 2018-05-04 DIAGNOSIS — E119 Type 2 diabetes mellitus without complications: Secondary | ICD-10-CM | POA: Diagnosis not present

## 2018-05-04 DIAGNOSIS — I10 Essential (primary) hypertension: Secondary | ICD-10-CM | POA: Diagnosis not present

## 2018-05-05 LAB — LIPID PANEL
Chol/HDL Ratio: 5.1 ratio — ABNORMAL HIGH (ref 0.0–5.0)
Cholesterol, Total: 173 mg/dL (ref 100–199)
HDL: 34 mg/dL — ABNORMAL LOW (ref >39)
LDL Calculated: 83 mg/dL (ref 0–99)
Triglycerides: 278 mg/dL — ABNORMAL HIGH (ref 0–149)
VLDL Cholesterol Cal: 56 mg/dL — ABNORMAL HIGH (ref 5–40)

## 2018-05-05 LAB — MICROALBUMIN / CREATININE URINE RATIO
Creatinine, Urine: 134.7 mg/dL
Microalb/Creat Ratio: 344.4 mg/g creat — ABNORMAL HIGH (ref 0.0–30.0)
Microalbumin, Urine: 463.9 ug/mL

## 2018-05-05 LAB — HEPATIC FUNCTION PANEL
ALT: 18 IU/L (ref 0–44)
AST: 16 IU/L (ref 0–40)
Albumin: 4.4 g/dL (ref 3.5–4.8)
Alkaline Phosphatase: 68 IU/L (ref 39–117)
Bilirubin Total: 0.6 mg/dL (ref 0.0–1.2)
Bilirubin, Direct: 0.18 mg/dL (ref 0.00–0.40)
Total Protein: 7.1 g/dL (ref 6.0–8.5)

## 2018-05-05 LAB — BASIC METABOLIC PANEL
BUN/Creatinine Ratio: 18 (ref 10–24)
BUN: 23 mg/dL (ref 8–27)
CO2: 23 mmol/L (ref 20–29)
Calcium: 9.9 mg/dL (ref 8.6–10.2)
Chloride: 99 mmol/L (ref 96–106)
Creatinine, Ser: 1.29 mg/dL — ABNORMAL HIGH (ref 0.76–1.27)
GFR calc Af Amer: 64 mL/min/{1.73_m2} (ref >59)
GFR calc non Af Amer: 55 mL/min/{1.73_m2} — ABNORMAL LOW (ref >59)
Glucose: 179 mg/dL — ABNORMAL HIGH (ref 65–99)
Potassium: 4.4 mmol/L (ref 3.5–5.2)
Sodium: 139 mmol/L (ref 134–144)

## 2018-05-05 LAB — PSA: Prostate Specific Ag, Serum: 0.5 ng/mL (ref 0.0–4.0)

## 2018-05-09 ENCOUNTER — Encounter: Payer: Self-pay | Admitting: Family Medicine

## 2018-08-23 ENCOUNTER — Other Ambulatory Visit: Payer: Self-pay | Admitting: Orthopaedic Surgery

## 2018-09-07 ENCOUNTER — Ambulatory Visit (HOSPITAL_COMMUNITY)
Admission: RE | Admit: 2018-09-07 | Discharge: 2018-09-07 | Disposition: A | Discharge status: Home / Self Care | Payer: Medicare HMO | Source: Ambulatory Visit | Attending: Family Medicine | Admitting: Family Medicine

## 2018-09-07 ENCOUNTER — Encounter: Payer: Self-pay | Admitting: Family Medicine

## 2018-09-07 ENCOUNTER — Ambulatory Visit (INDEPENDENT_AMBULATORY_CARE_PROVIDER_SITE_OTHER): Payer: Medicare HMO | Admitting: Family Medicine

## 2018-09-07 VITALS — BP 146/80 | Temp 98.4°F | Ht 70.0 in | Wt 218.8 lb

## 2018-09-07 DIAGNOSIS — M79642 Pain in left hand: Secondary | ICD-10-CM

## 2018-09-07 DIAGNOSIS — M2550 Pain in unspecified joint: Secondary | ICD-10-CM

## 2018-09-07 DIAGNOSIS — M353 Polymyalgia rheumatica: Secondary | ICD-10-CM

## 2018-09-07 DIAGNOSIS — M25512 Pain in left shoulder: Secondary | ICD-10-CM

## 2018-09-07 DIAGNOSIS — M19012 Primary osteoarthritis, left shoulder: Secondary | ICD-10-CM | POA: Diagnosis not present

## 2018-09-07 DIAGNOSIS — M791 Myalgia, unspecified site: Secondary | ICD-10-CM

## 2018-09-07 DIAGNOSIS — M19042 Primary osteoarthritis, left hand: Secondary | ICD-10-CM | POA: Diagnosis not present

## 2018-09-07 NOTE — Progress Notes (Signed)
   Subjective:    Patient ID: Jack Mejia, male    DOB: [birth year], 73 y.o.   MRN: [id number]  HPIBody aches and joint pain in upper body. Back, shoulders and arms.  Started one month ago. Tried coricidin, theraflu, arhritis pain relief.    Has gone on for about a nont   Fatigued sleepy ttired   Aching bad in the shoulders  Worse in the arms too    Shoulders aching   [place]ed going to [place] [employer], rode a bike and walked a tread mill and did pulls with the arms  Arms also feel wiak, during th day too  Not sleeping well  Notes sweling in the hands at times  Hands stiff   Worse in the orn   Mo had rheum arthritis   Bilateral hand swelling off and on for about one year.    Review of Systems No headache, no major weight loss or weight gain, no chest pain no back pain abdominal pain no change in bowel habits complete ROS otherwise negative     Objective:   Physical Exam Alert and oriented, vitals reviewed and stable, NAD ENT-TM's and ext canals WNL bilat via otoscopic exam Soft palate, tonsils and post pharynx WNL via oropharyngeal exam Neck-symmetric, no masses; thyroid nonpalpable and nontender Pulmonary-no tachypnea or accessory muscle use; Clear without wheezes via auscultation Card--no abnrml murmurs, rhythm reg and rate WNL Carotid pulses symmetric, without bruits Left shoulder diminished range of motion with impingement signs.  Both hands mild puffiness.  No obvious metacarpal phalangeal swelling grip intact.  Shoulders somewhat weak.       Assessment & Plan:  Impression progressive fatigue/shoulder pain aching/family history rheumatoid arthritis.  Progressive symptoms over the last month.  Achy at times.  Need to consider less common scenarios such as polymyalgia rheumatica or rheumatoid arthritis.  X-ray left shoulder left hand symptom care discussed appropriate blood work further recommendations based on results

## 2018-09-08 LAB — CBC WITH DIFFERENTIAL/PLATELET
Basophils Absolute: 0.1 10*3/uL (ref 0.0–0.2)
Basos: 1 %
EOS (ABSOLUTE): 0.2 10*3/uL (ref 0.0–0.4)
Eos: 2 %
Hematocrit: 34.2 % — ABNORMAL LOW (ref 37.5–51.0)
Hemoglobin: 11.9 g/dL — ABNORMAL LOW (ref 13.0–17.7)
Immature Grans (Abs): 0.1 10*3/uL (ref 0.0–0.1)
Immature Granulocytes: 1 %
Lymphocytes Absolute: 1.3 10*3/uL (ref 0.7–3.1)
Lymphs: 14 %
MCH: 35.1 pg — ABNORMAL HIGH (ref 26.6–33.0)
MCHC: 34.8 g/dL (ref 31.5–35.7)
MCV: 101 fL — ABNORMAL HIGH (ref 79–97)
Monocytes Absolute: 1.2 10*3/uL — ABNORMAL HIGH (ref 0.1–0.9)
Monocytes: 13 %
Neutrophils Absolute: 6.8 10*3/uL (ref 1.4–7.0)
Neutrophils: 69 %
Platelets: 229 10*3/uL (ref 150–450)
RBC: 3.39 x10E6/uL — ABNORMAL LOW (ref 4.14–5.80)
RDW: 12.8 % (ref 11.6–15.4)
WBC: 9.7 10*3/uL (ref 3.4–10.8)

## 2018-09-08 LAB — RHEUMATOID FACTOR: Rheumatoid fact SerPl-aCnc: <10 IU/mL (ref 0.0–13.9)

## 2018-09-08 LAB — ANA: Anti Nuclear Antibody(ANA): POSITIVE — AB

## 2018-09-08 LAB — SEDIMENTATION RATE: Sed Rate: 86 mm/hr — ABNORMAL HIGH (ref 0–30)

## 2018-09-08 LAB — CK: Total CK: 87 U/L (ref 24–204)

## 2018-09-08 MED ORDER — PREDNISONE 10 MG PO TABS: 10.0000 mg | ORAL_TABLET | Freq: Every day | ORAL | 0 refills | Status: DC

## 2018-09-08 NOTE — Addendum Note (Signed)
Addended by: Karle Barr on: 09/08/2018 03:32 PM   Modules accepted: Orders

## 2018-09-08 NOTE — Addendum Note (Signed)
Addended by: Karle Barr on: 09/08/2018 03:36 PM   Modules accepted: Orders

## 2018-09-15 ENCOUNTER — Encounter: Payer: Self-pay | Admitting: Family Medicine

## 2018-09-21 DIAGNOSIS — R700 Elevated erythrocyte sedimentation rate: Secondary | ICD-10-CM | POA: Diagnosis not present

## 2018-09-21 DIAGNOSIS — E669 Obesity, unspecified: Secondary | ICD-10-CM | POA: Diagnosis not present

## 2018-09-21 DIAGNOSIS — R5383 Other fatigue: Secondary | ICD-10-CM | POA: Diagnosis not present

## 2018-09-21 DIAGNOSIS — Z6832 Body mass index (BMI) 32.0-32.9, adult: Secondary | ICD-10-CM | POA: Diagnosis not present

## 2018-09-21 DIAGNOSIS — M2550 Pain in unspecified joint: Secondary | ICD-10-CM | POA: Diagnosis not present

## 2018-09-21 DIAGNOSIS — M353 Polymyalgia rheumatica: Secondary | ICD-10-CM | POA: Diagnosis not present

## 2018-09-21 DIAGNOSIS — Z8261 Family history of arthritis: Secondary | ICD-10-CM | POA: Diagnosis not present

## 2018-09-21 DIAGNOSIS — M7989 Other specified soft tissue disorders: Secondary | ICD-10-CM | POA: Diagnosis not present

## 2018-10-01 ENCOUNTER — Other Ambulatory Visit: Payer: Self-pay | Admitting: Family Medicine

## 2018-10-04 ENCOUNTER — Other Ambulatory Visit: Payer: Self-pay

## 2018-10-04 NOTE — Telephone Encounter (Signed)
Pt states that he does not need this refilled. Pt states he was told to take 3 a day but is unable to do so. Pt advised to call rheumatologist to discuss this. Pt verbalized understanding

## 2018-10-04 NOTE — Telephone Encounter (Signed)
May ref times one, plz let pt know the rheum has to provide future ref on this

## 2018-10-04 NOTE — Telephone Encounter (Signed)
Call pt, how is he doing,? Has he not seen the rheumatologist yet? When is his appt? I really want them deciding whether to maintain

## 2018-10-04 NOTE — Telephone Encounter (Signed)
Pt states he is doing pretty good. Rheumatologist wants him to take Prednisone 3 times a day. Pt states he did try taking 3 a day but he states he did not feel right. Pt next appt is  10/20/2018 with rheumatologist.

## 2018-10-20 DIAGNOSIS — R768 Other specified abnormal immunological findings in serum: Secondary | ICD-10-CM | POA: Diagnosis not present

## 2018-10-20 DIAGNOSIS — M2550 Pain in unspecified joint: Secondary | ICD-10-CM | POA: Diagnosis not present

## 2018-10-20 DIAGNOSIS — M353 Polymyalgia rheumatica: Secondary | ICD-10-CM | POA: Diagnosis not present

## 2018-10-20 DIAGNOSIS — Z8261 Family history of arthritis: Secondary | ICD-10-CM | POA: Diagnosis not present

## 2018-10-20 DIAGNOSIS — E669 Obesity, unspecified: Secondary | ICD-10-CM | POA: Diagnosis not present

## 2018-10-20 DIAGNOSIS — Z6833 Body mass index (BMI) 33.0-33.9, adult: Secondary | ICD-10-CM | POA: Diagnosis not present

## 2018-10-20 DIAGNOSIS — M7989 Other specified soft tissue disorders: Secondary | ICD-10-CM | POA: Diagnosis not present

## 2018-10-21 ENCOUNTER — Other Ambulatory Visit: Payer: Self-pay | Admitting: Family Medicine

## 2018-10-26 ENCOUNTER — Other Ambulatory Visit: Payer: Self-pay | Admitting: Family Medicine

## 2018-10-26 DIAGNOSIS — R69 Illness, unspecified: Secondary | ICD-10-CM | POA: Diagnosis not present

## 2018-10-27 ENCOUNTER — Other Ambulatory Visit: Payer: Self-pay | Admitting: Family Medicine

## 2018-11-01 ENCOUNTER — Other Ambulatory Visit: Payer: Self-pay | Admitting: Family Medicine

## 2018-11-02 ENCOUNTER — Ambulatory Visit (INDEPENDENT_AMBULATORY_CARE_PROVIDER_SITE_OTHER): Payer: Medicare HMO | Admitting: Family Medicine

## 2018-11-02 ENCOUNTER — Encounter: Payer: Self-pay | Admitting: Family Medicine

## 2018-11-02 ENCOUNTER — Other Ambulatory Visit: Payer: Self-pay

## 2018-11-02 ENCOUNTER — Ambulatory Visit: Payer: Medicare HMO | Admitting: Family Medicine

## 2018-11-02 DIAGNOSIS — E118 Type 2 diabetes mellitus with unspecified complications: Secondary | ICD-10-CM

## 2018-11-02 DIAGNOSIS — E785 Hyperlipidemia, unspecified: Secondary | ICD-10-CM

## 2018-11-02 DIAGNOSIS — I10 Essential (primary) hypertension: Secondary | ICD-10-CM

## 2018-11-02 NOTE — Progress Notes (Signed)
   Subjective:  Telephone visit  Patient ID: Jack Mejia, male    DOB: 02-08-1946, 73 y.o.   MRN: [id number]  BP this morning per pt 166/89 and after sitting for awhile 140/77.  Diabetes  He presents for his follow-up diabetic visit. He has type 2 diabetes mellitus. Home blood sugar record trend: 154 this morning before breaksfast and 181 after breaksfast. Eye exam is current (summer 2019).   Found tick on him last night. Pulled tick off and put some alcholol on it. A little red spot where he was stuck on.   Virtual Visit via Telephone Note  I connected with Jack Mejia on 11/02/18 at  9:30 AM EDT by telephone and verified that I am speaking with the correct person using two identifiers.   I discussed the limitations, risks, security and privacy concerns of performing an evaluation and management service by telephone and the availability of in person appointments. I also discussed with the patient that there may be a patient responsible charge related to this service. The patient expressed understanding and agreed to proceed.  Blood pressure medicine and blood pressure levels reviewed today with patient. Compliant with blood pressure medicine. States does not miss a dose. No obvious side effects. Blood pressure generally good when checked elsewhere. Watching salt intake.   Patient claims compliance with diabetes medication. No obvious side effects. Reports no substantial low sugar spells. Most numbers are generally in good range when checked fasting. Generally does not miss a dose of medication. Watching diabetic diet closely.Marland Kitchenbp  Patient now on prednisone for polymyalgia rheumatica.  Reports that it is definitely helping him.  No obvious negative side effects  Patient reports tick bite.  No symptoms of tickborne illness History of Present Illness:    Observations/Objective:   Assessment and Plan:   Follow Up Instructions:    I discussed the assessment and treatment plan  with the patient. The patient was provided an opportunity to ask questions and all were answered. The patient agreed with the plan and demonstrated an understanding of the instructions.   The patient was advised to call back or seek an in-person evaluation if the symptoms worsen or if the condition fails to improve as anticipated.  I provided 25 minutes of non-face-to-face time during this encounter.     Review of Systems     Objective:   Physical Exam  Virtual visit      Assessment & Plan:  Impression hypertension.  Suboptimal.  Discussed.  Increase Norvasc to 10 mg daily  2.  Type 2 diabetes.  Control overall.  To maintain some  3.  Tick bite.  Discussed.  4.  Polymyalgia rheumatica.  Discussed

## 2018-11-23 ENCOUNTER — Other Ambulatory Visit: Payer: Self-pay | Admitting: Family Medicine

## 2018-12-21 DIAGNOSIS — Z8261 Family history of arthritis: Secondary | ICD-10-CM | POA: Diagnosis not present

## 2018-12-21 DIAGNOSIS — R768 Other specified abnormal immunological findings in serum: Secondary | ICD-10-CM | POA: Diagnosis not present

## 2018-12-21 DIAGNOSIS — M353 Polymyalgia rheumatica: Secondary | ICD-10-CM | POA: Diagnosis not present

## 2018-12-21 DIAGNOSIS — M7989 Other specified soft tissue disorders: Secondary | ICD-10-CM | POA: Diagnosis not present

## 2018-12-21 DIAGNOSIS — Z6831 Body mass index (BMI) 31.0-31.9, adult: Secondary | ICD-10-CM | POA: Diagnosis not present

## 2018-12-21 DIAGNOSIS — R5383 Other fatigue: Secondary | ICD-10-CM | POA: Diagnosis not present

## 2018-12-21 DIAGNOSIS — M2550 Pain in unspecified joint: Secondary | ICD-10-CM | POA: Diagnosis not present

## 2018-12-21 DIAGNOSIS — E669 Obesity, unspecified: Secondary | ICD-10-CM | POA: Diagnosis not present

## 2019-01-26 ENCOUNTER — Other Ambulatory Visit: Payer: Self-pay | Admitting: Family Medicine

## 2019-02-02 ENCOUNTER — Ambulatory Visit (INDEPENDENT_AMBULATORY_CARE_PROVIDER_SITE_OTHER): Payer: Medicare HMO | Admitting: Family Medicine

## 2019-02-02 ENCOUNTER — Encounter: Payer: Self-pay | Admitting: Family Medicine

## 2019-02-02 ENCOUNTER — Other Ambulatory Visit: Payer: Self-pay

## 2019-02-02 VITALS — BP 138/82 | Temp 97.9°F | Wt 219.2 lb

## 2019-02-02 DIAGNOSIS — L03221 Cellulitis of neck: Secondary | ICD-10-CM

## 2019-02-02 MED ORDER — CEFTRIAXONE SODIUM 500 MG IJ SOLR
500.0000 mg | Freq: Once | INTRAMUSCULAR | Status: AC
  Administered 2019-02-02: 500 mg via INTRAMUSCULAR

## 2019-02-02 MED ORDER — DOXYCYCLINE HYCLATE 100 MG PO TABS: 100.0000 mg | ORAL_TABLET | Freq: Two times a day (BID) | ORAL | 0 refills | Status: DC

## 2019-02-02 NOTE — Patient Instructions (Signed)

## 2019-02-02 NOTE — Progress Notes (Signed)
   Subjective:    Patient ID: Jack Mejia, male    DOB: February 22, 1946, 73 y.o.   MRN: [id number]   HPI Pt here today for spider bite on neck. Pt states he went to bed Sunday and woke up Monday with a knot on neck. Pt is having neck pain and some headaches. Pt states he has been tired and sleepy all the time. Pt has been putting alcohol and Vitamin A gel; pt also has been scraping the scab and has had some pus come out.  The patient feels that he has a spider bite but I believe that this is more of a folliculitis that turned into a MRSA infection but there is no abscess detected on physical exam  Review of Systems Tenderness of the back of the neck some chills no fever no vomiting no disorientation headache nausea vomiting diarrhea    Objective:   Physical Exam  There is a inflamed area approximately 2 inches x 2 inches with a scabbed area in the middle that is tender and swollen but no abscess consistent with cellulitis probable MRSA      Assessment & Plan:  I cellulitis Rocephin 500 Doxycycline twice daily for 10 days Warm compresses frequently If ongoing troubles or problems it is very important for the patient to follow-up with [place] immediately if high fevers or chills go to the ER immediately

## 2019-02-13 ENCOUNTER — Other Ambulatory Visit: Payer: Self-pay

## 2019-02-13 ENCOUNTER — Ambulatory Visit (INDEPENDENT_AMBULATORY_CARE_PROVIDER_SITE_OTHER): Payer: Medicare HMO | Admitting: Family Medicine

## 2019-02-13 ENCOUNTER — Encounter: Payer: Self-pay | Admitting: Family Medicine

## 2019-02-13 VITALS — BP 130/74 | Temp 97.9°F | Ht 70.0 in | Wt 213.8 lb

## 2019-02-13 DIAGNOSIS — L720 Epidermal cyst: Secondary | ICD-10-CM | POA: Diagnosis not present

## 2019-02-13 MED ORDER — DOXYCYCLINE HYCLATE 100 MG PO TABS: 100.0000 mg | ORAL_TABLET | Freq: Two times a day (BID) | ORAL | 0 refills | Status: DC

## 2019-02-13 NOTE — Progress Notes (Signed)
   Subjective:    Patient ID: Jack Mejia, male    DOB: Jan 29, 1946, 73 y.o.   MRN: [id number]  HPIrecheck cellulitis of neck. Pt states it is better. Finished doxy yesterday. Still having some pain in that area. Using heat on it.   Patient nearly finished with his antibiotics.  Less discomfort no drainage no discharge  Review of Systems No headache, no major weight loss or weight gain, no chest pain no back pain abdominal pain no change in bowel habits complete ROS otherwise negative     Objective:   Physical Exam  Alert vitals stable, NAD. Blood pressure good on repeat. HEENT posterior lateral neck mass.  Firm.  No fluctuance.  Slight tenderness.  Could well be resolving inflamed/infected sebaceous cyst.  Normal. Lungs clear. Heart regular rate and rhythm.       Assessment & Plan:  Impression skin structure infection with persistent mass on neck will finish antibiotics and ENT referral rationale discussed

## 2019-02-15 ENCOUNTER — Encounter: Payer: Self-pay | Admitting: Family Medicine

## 2019-02-15 DIAGNOSIS — H52229 Regular astigmatism, unspecified eye: Secondary | ICD-10-CM | POA: Diagnosis not present

## 2019-02-21 ENCOUNTER — Observation Stay (HOSPITAL_COMMUNITY)
Admission: EM | Admit: 2019-02-21 | Discharge: 2019-02-22 | Disposition: A | Discharge status: Home / Self Care | Payer: Medicare HMO | Attending: Internal Medicine | Admitting: Internal Medicine

## 2019-02-21 ENCOUNTER — Emergency Department (HOSPITAL_COMMUNITY): Payer: Medicare HMO

## 2019-02-21 ENCOUNTER — Encounter: Payer: Self-pay | Admitting: Family Medicine

## 2019-02-21 ENCOUNTER — Encounter (HOSPITAL_COMMUNITY): Payer: Self-pay | Admitting: *Deleted

## 2019-02-21 ENCOUNTER — Ambulatory Visit (INDEPENDENT_AMBULATORY_CARE_PROVIDER_SITE_OTHER): Payer: Medicare HMO | Admitting: Family Medicine

## 2019-02-21 ENCOUNTER — Other Ambulatory Visit: Payer: Self-pay

## 2019-02-21 VITALS — Temp 97.6°F | Wt 210.6 lb

## 2019-02-21 DIAGNOSIS — Z7984 Long term (current) use of oral hypoglycemic drugs: Secondary | ICD-10-CM | POA: Diagnosis not present

## 2019-02-21 DIAGNOSIS — E785 Hyperlipidemia, unspecified: Secondary | ICD-10-CM | POA: Diagnosis not present

## 2019-02-21 DIAGNOSIS — N183 Chronic kidney disease, stage 3 (moderate): Secondary | ICD-10-CM | POA: Diagnosis not present

## 2019-02-21 DIAGNOSIS — K760 Fatty (change of) liver, not elsewhere classified: Secondary | ICD-10-CM | POA: Insufficient documentation

## 2019-02-21 DIAGNOSIS — Z87891 Personal history of nicotine dependence: Secondary | ICD-10-CM | POA: Diagnosis not present

## 2019-02-21 DIAGNOSIS — Z8261 Family history of arthritis: Secondary | ICD-10-CM | POA: Diagnosis not present

## 2019-02-21 DIAGNOSIS — M353 Polymyalgia rheumatica: Secondary | ICD-10-CM | POA: Diagnosis not present

## 2019-02-21 DIAGNOSIS — Z20828 Contact with and (suspected) exposure to other viral communicable diseases: Secondary | ICD-10-CM | POA: Insufficient documentation

## 2019-02-21 DIAGNOSIS — I10 Essential (primary) hypertension: Secondary | ICD-10-CM | POA: Diagnosis not present

## 2019-02-21 DIAGNOSIS — I4891 Unspecified atrial fibrillation: Secondary | ICD-10-CM

## 2019-02-21 DIAGNOSIS — Z79899 Other long term (current) drug therapy: Secondary | ICD-10-CM | POA: Diagnosis not present

## 2019-02-21 DIAGNOSIS — I129 Hypertensive chronic kidney disease with stage 1 through stage 4 chronic kidney disease, or unspecified chronic kidney disease: Secondary | ICD-10-CM | POA: Insufficient documentation

## 2019-02-21 DIAGNOSIS — Z833 Family history of diabetes mellitus: Secondary | ICD-10-CM | POA: Insufficient documentation

## 2019-02-21 DIAGNOSIS — R0602 Shortness of breath: Secondary | ICD-10-CM | POA: Insufficient documentation

## 2019-02-21 DIAGNOSIS — E669 Obesity, unspecified: Secondary | ICD-10-CM | POA: Diagnosis not present

## 2019-02-21 DIAGNOSIS — E1142 Type 2 diabetes mellitus with diabetic polyneuropathy: Secondary | ICD-10-CM | POA: Insufficient documentation

## 2019-02-21 DIAGNOSIS — Z885 Allergy status to narcotic agent status: Secondary | ICD-10-CM | POA: Insufficient documentation

## 2019-02-21 DIAGNOSIS — Z888 Allergy status to other drugs, medicaments and biological substances status: Secondary | ICD-10-CM | POA: Diagnosis not present

## 2019-02-21 DIAGNOSIS — R000 Tachycardia, unspecified: Secondary | ICD-10-CM | POA: Diagnosis not present

## 2019-02-21 DIAGNOSIS — M2550 Pain in unspecified joint: Secondary | ICD-10-CM | POA: Diagnosis not present

## 2019-02-21 DIAGNOSIS — R5383 Other fatigue: Secondary | ICD-10-CM | POA: Insufficient documentation

## 2019-02-21 DIAGNOSIS — R0600 Dyspnea, unspecified: Secondary | ICD-10-CM | POA: Diagnosis not present

## 2019-02-21 DIAGNOSIS — M7989 Other specified soft tissue disorders: Secondary | ICD-10-CM | POA: Diagnosis not present

## 2019-02-21 DIAGNOSIS — M1990 Unspecified osteoarthritis, unspecified site: Secondary | ICD-10-CM | POA: Diagnosis not present

## 2019-02-21 DIAGNOSIS — I4892 Unspecified atrial flutter: Secondary | ICD-10-CM | POA: Insufficient documentation

## 2019-02-21 DIAGNOSIS — E1122 Type 2 diabetes mellitus with diabetic chronic kidney disease: Secondary | ICD-10-CM | POA: Diagnosis not present

## 2019-02-21 DIAGNOSIS — R768 Other specified abnormal immunological findings in serum: Secondary | ICD-10-CM | POA: Diagnosis not present

## 2019-02-21 DIAGNOSIS — Z6831 Body mass index (BMI) 31.0-31.9, adult: Secondary | ICD-10-CM | POA: Diagnosis not present

## 2019-02-21 LAB — BASIC METABOLIC PANEL
Anion gap: 14 (ref 5–15)
BUN: 33 mg/dL — ABNORMAL HIGH (ref 8–23)
CO2: 19 mmol/L — ABNORMAL LOW (ref 22–32)
Calcium: 9 mg/dL (ref 8.9–10.3)
Chloride: 103 mmol/L (ref 98–111)
Creatinine, Ser: 1.69 mg/dL — ABNORMAL HIGH (ref 0.61–1.24)
GFR calc Af Amer: 46 mL/min — ABNORMAL LOW (ref >60)
GFR calc non Af Amer: 40 mL/min — ABNORMAL LOW (ref >60)
Glucose, Bld: 244 mg/dL — ABNORMAL HIGH (ref 70–99)
Potassium: 3.8 mmol/L (ref 3.5–5.1)
Sodium: 136 mmol/L (ref 135–145)

## 2019-02-21 LAB — CBC WITH DIFFERENTIAL/PLATELET
Abs Immature Granulocytes: 0.08 10*3/uL — ABNORMAL HIGH (ref 0.00–0.07)
Basophils Absolute: 0 10*3/uL (ref 0.0–0.1)
Basophils Relative: 0 %
Eosinophils Absolute: 0 10*3/uL (ref 0.0–0.5)
Eosinophils Relative: 0 %
HCT: 33.7 % — ABNORMAL LOW (ref 39.0–52.0)
Hemoglobin: 11.4 g/dL — ABNORMAL LOW (ref 13.0–17.0)
Immature Granulocytes: 1 %
Lymphocytes Relative: 20 %
Lymphs Abs: 1.2 10*3/uL (ref 0.7–4.0)
MCH: 35.3 pg — ABNORMAL HIGH (ref 26.0–34.0)
MCHC: 33.8 g/dL (ref 30.0–36.0)
MCV: 104.3 fL — ABNORMAL HIGH (ref 80.0–100.0)
Monocytes Absolute: 1.1 10*3/uL — ABNORMAL HIGH (ref 0.1–1.0)
Monocytes Relative: 18 %
Neutro Abs: 3.7 10*3/uL (ref 1.7–7.7)
Neutrophils Relative %: 61 %
Platelets: 167 10*3/uL (ref 150–400)
RBC: 3.23 MIL/uL — ABNORMAL LOW (ref 4.22–5.81)
RDW: 13.7 % (ref 11.5–15.5)
WBC: 6.1 10*3/uL (ref 4.0–10.5)
nRBC: 0 % (ref 0.0–0.2)

## 2019-02-21 LAB — SARS CORONAVIRUS 2 BY RT PCR (HOSPITAL ORDER, PERFORMED IN ~~LOC~~ HOSPITAL LAB): SARS Coronavirus 2: NEGATIVE

## 2019-02-21 LAB — BRAIN NATRIURETIC PEPTIDE: B Natriuretic Peptide: 100 pg/mL (ref 0.0–100.0)

## 2019-02-21 LAB — MAGNESIUM: Magnesium: 1.4 mg/dL — ABNORMAL LOW (ref 1.7–2.4)

## 2019-02-21 LAB — GLUCOSE, CAPILLARY: Glucose-Capillary: 177 mg/dL — ABNORMAL HIGH (ref 70–99)

## 2019-02-21 LAB — TSH: TSH: 0.74 u[IU]/mL (ref 0.350–4.500)

## 2019-02-21 MED ORDER — HEPARIN (PORCINE) 25000 UT/250ML-% IV SOLN
1200.0000 [IU]/h | INTRAVENOUS | Status: DC
  Administered 2019-02-21: 1400 [IU]/h via INTRAVENOUS
  Filled 2019-02-21: qty 250

## 2019-02-21 MED ORDER — PREDNISONE 10 MG PO TABS
5.0000 mg | ORAL_TABLET | Freq: Two times a day (BID) | ORAL | Status: DC
  Administered 2019-02-22: 5 mg via ORAL
  Filled 2019-02-21: qty 1

## 2019-02-21 MED ORDER — DILTIAZEM HCL 100 MG IV SOLR
5.0000 mg/h | INTRAVENOUS | Status: DC
  Administered 2019-02-21: 5 mg/h via INTRAVENOUS
  Filled 2019-02-21 (×2): qty 100

## 2019-02-21 MED ORDER — SODIUM CHLORIDE 0.9 % IV SOLN
INTRAVENOUS | Status: DC
  Administered 2019-02-22: 11:00:00 via INTRAVENOUS

## 2019-02-21 MED ORDER — DOXYCYCLINE HYCLATE 100 MG PO TABS
100.0000 mg | ORAL_TABLET | Freq: Two times a day (BID) | ORAL | Status: DC
  Administered 2019-02-21 – 2019-02-22 (×2): 100 mg via ORAL
  Filled 2019-02-21 (×2): qty 1

## 2019-02-21 MED ORDER — INSULIN ASPART 100 UNIT/ML ~~LOC~~ SOLN
0.0000 [IU] | Freq: Three times a day (TID) | SUBCUTANEOUS | Status: DC
  Administered 2019-02-22: 5 [IU] via SUBCUTANEOUS
  Administered 2019-02-22: 2 [IU] via SUBCUTANEOUS

## 2019-02-21 MED ORDER — DILTIAZEM LOAD VIA INFUSION
20.0000 mg | Freq: Once | INTRAVENOUS | Status: AC
  Administered 2019-02-21: 20 mg via INTRAVENOUS
  Filled 2019-02-21: qty 20

## 2019-02-21 MED ORDER — ONDANSETRON HCL 4 MG PO TABS: 4.0000 mg | ORAL_TABLET | Freq: Four times a day (QID) | ORAL | Status: DC | PRN

## 2019-02-21 MED ORDER — CHLORHEXIDINE GLUCONATE CLOTH 2 % EX PADS
6.0000 | MEDICATED_PAD | Freq: Every day | CUTANEOUS | Status: DC
  Administered 2019-02-21 – 2019-02-22 (×2): 6 via TOPICAL

## 2019-02-21 MED ORDER — HEPARIN BOLUS VIA INFUSION
4000.0000 [IU] | Freq: Once | INTRAVENOUS | Status: AC
  Administered 2019-02-21: 4000 [IU] via INTRAVENOUS

## 2019-02-21 MED ORDER — ONDANSETRON HCL 4 MG/2ML IJ SOLN: 4.0000 mg | Freq: Four times a day (QID) | INTRAMUSCULAR | Status: DC | PRN

## 2019-02-21 MED ORDER — MAGNESIUM SULFATE 2 GM/50ML IV SOLN
2.0000 g | Freq: Once | INTRAVENOUS | Status: AC
  Administered 2019-02-21: 2 g via INTRAVENOUS
  Filled 2019-02-21: qty 50

## 2019-02-21 NOTE — ED Triage Notes (Signed)
Pt was at Dr. Lance Sell office today and EKG was performed. Pt was found to be in A. Fib with RVR with HR of 144bpm. Pt c/o SOB over the last several months. Denies cough, fever, chest pain, palpitations. Pt reports he had a syncopal episode last Thursday. Pt stood up and then fell to the ground, pt reports when he woke up there was blood on the floor and he had cut his forehead.

## 2019-02-21 NOTE — ED Provider Notes (Signed)
[facility] ([place]) EMERGENCY DEPARTMENT Provider Note   CSN: [id number] Arrival date & time: 02/21/19  1504     History   Chief Complaint Chief Complaint  Patient presents with  . Shortness of Breath    HPI Jack Mejia is a 73 y.o. male.     HPI   1yM with dyspnea and fatigue.  Onset weeks to months ago.  Constant.  Feels worse with exertion.  Recently saw her rheumatologist who noted significant resting tachycardia.  It was recommended that he go to the emergency room.  He went today to see his PCP instead.  He was noted to be in A. fib with RVR and was referred to the ER.  States that he has had palpitations at times.  No history of any cardiac problems that he is aware of.  No cough.  No unusual leg pain or swelling.  Reports having a syncopal episode this past Thursday.  He states he was dog sitting for somebody.  He went to get up because the dog wanted outt and as he was standing he felt lightheaded and had a brief loss of consciousness.  Past Medical History:  Diagnosis Date  . Arthritis   . Cramps of left lower extremity   . Depression   . Diabetes mellitus    type 2 for 7-8 yrs  . Dyspnea   . Elevated liver enzymes   . Fatty liver   . History of kidney stones   . Hyperlipidemia   . Hypertension   . Vertigo     Patient Active Problem List   Diagnosis Date Noted  . Depression, major, single episode, mild ([place]) 03/01/2017  . Lumbar stenosis with neurogenic claudication 08/06/2016  . Erectile dysfunction 12/05/2012  . Sensory neuropathy 12/05/2012  . Hyperlipidemia LDL goal <100 12/05/2012  . Rectal bleeding 10/13/2012  . Hypertension 07/07/2011  . Diabetes mellitus ([place]) 07/07/2011  . Fatty liver 07/07/2011    Past Surgical History:  Procedure Laterality Date  . CARPAL TUNNEL RELEASE     both wrist  . cataract surgery     bilateral  . CHOLECYSTECTOMY    . COLON SURGERY    . EYE SURGERY    . LUMBAR LAMINECTOMY/DECOMPRESSION MICRODISCECTOMY N/A  08/06/2016   Procedure: LUMBAR THREE- LUMBAR FIVE  DECOMPRESSIVE LUMBAR LAMINECTOMY;  Surgeon: Jovita Gamma, MD;  Location: [place];  Service: Neurosurgery;  Laterality: N/A;        Home Medications    Prior to Admission medications   Medication Sig Start Date End Date Taking? Authorizing Provider  ACCU-CHEK AVIVA PLUS test strip USE ONE STRIP TO CHECK GLUCOSE ONCE DAILY AS DIRECTED 08/31/16   [name], MD  amLODipine (NORVASC) 5 MG tablet TAKE 1 TABLET BY MOUTH EVERY DAY 10/21/18   [name], MD  aspirin EC 81 MG tablet Take 1 tablet (81 mg total) by mouth daily. 03/02/17   [name], MD  Cyanocobalamin (VITAMIN B 12 PO) Take by mouth.    [provider]  doxycycline (VIBRA-TABS) 100 MG tablet Take 1 tablet (100 mg total) by mouth 2 (two) times daily. 02/13/19   [name], MD  glipiZIDE (GLUCOTROL) 5 MG tablet TAKE 2 TABLETS BY MOUTH TWICE A DAY 11/01/18   [name], MD  glucose blood (RELION ULTIMA TEST) test strip Use as instructed 02/21/14   [name], MD  hydrochlorothiazide (HYDRODIURIL) 25 MG tablet TAKE 1 TABLET BY MOUTH EVERY DAY 11/23/18   Luking,  Grace Bushy, MD  loratadine (CLARITIN) 10 MG tablet Take 10 mg by mouth daily.    [provider]  losartan (COZAAR) 50 MG tablet TAKE 1 TABLET BY MOUTH TWICE A DAY 01/26/19   [name], MD  metFORMIN (GLUCOPHAGE) 500 MG tablet TAKE 2 TABLETS BY MOUTH TWICE A DAY 11/01/18   [name], MD  naproxen (NAPROSYN) 500 MG tablet TAKE 1 TABLET (500 MG TOTAL) BY MOUTH 2 (TWO) TIMES DAILY WITH A MEAL. 08/23/18   Sanjuana Kava, MD  naproxen sodium (ANAPROX) 220 MG tablet Take 220 mg by mouth 2 (two) times daily as needed.    [provider]  Omega-3 Fatty Acids (OMEGA-3 FISH OIL PO) Take by mouth.    [provider]  OneTouch Delica Lancets 71I MISC USE ONE LANCET TO CHECK GLUCOSE ONCE DAILY OR AS DIRECTED BY MD 10/27/18   [name], MD  [facility] VERIO test  strip USE ONE STRIP TO CHECK GLUCOSE ONCE DAILY OR AS DIRECTED BY MD 01/04/18   [name], MD  predniSONE (DELTASONE) 10 MG tablet Take 1 tablet (10 mg total) by mouth daily with breakfast. 09/08/18   [name], MD    Family History Family History  Problem Relation Age of Onset  . Diabetes Mother   . Cancer Mother   . Diabetes Sister   . Diabetes Maternal Grandmother     Social History Social History   Tobacco Use  . Smoking status: Former Smoker    Types: Cigarettes    Quit date: 10/14/1982    Years since quitting: 36.3  . Smokeless tobacco: Never Used  . Tobacco comment: 29 yrs ago  Substance Use Topics  . Alcohol use: No  . Drug use: No     Allergies   Demerol and Vasotec [enalapril]   Review of Systems Review of Systems All systems reviewed and negative, other than as noted in HPI.   Physical Exam Updated Vital Signs BP (!) 161/105   Pulse (!) 139   Resp 20   SpO2 100%   Physical Exam Vitals signs and nursing note reviewed.  Constitutional:      General: He is not in acute distress.    Appearance: He is well-developed. He is obese.  HENT:     Head: Normocephalic and atraumatic.  Eyes:     General:        Right eye: No discharge.        Left eye: No discharge.     Conjunctiva/sclera: Conjunctivae normal.  Neck:     Musculoskeletal: Neck supple.  Cardiovascular:     Rate and Rhythm: Tachycardia present. Rhythm irregular.     Heart sounds: Normal heart sounds. No murmur. No friction rub. No gallop.   Pulmonary:     Effort: Pulmonary effort is normal. No respiratory distress.     Breath sounds: Normal breath sounds.  Abdominal:     General: There is no distension.     Palpations: Abdomen is soft.     Tenderness: There is no abdominal tenderness.  Musculoskeletal:        General: No tenderness.  Skin:    General: Skin is warm and dry.  Neurological:     Mental Status: He is alert.  Psychiatric:        Behavior: Behavior normal.         Thought Content: Thought content normal.      ED Treatments / Results  Labs (all labs ordered  are listed, but only abnormal results are displayed) Labs Reviewed  CBC WITH DIFFERENTIAL/PLATELET - Abnormal; Notable for the following components:      Result Value   RBC 3.23 (*)    Hemoglobin 11.4 (*)    HCT 33.7 (*)    MCV 104.3 (*)    MCH 35.3 (*)    Monocytes Absolute 1.1 (*)    Abs Immature Granulocytes 0.08 (*)    All other components within normal limits  BASIC METABOLIC PANEL - Abnormal; Notable for the following components:   CO2 19 (*)    Glucose, Bld 244 (*)    BUN 33 (*)    Creatinine, Ser 1.69 (*)    GFR calc non Af Amer 40 (*)    GFR calc Af Amer 46 (*)    All other components within normal limits  MAGNESIUM - Abnormal; Notable for the following components:   Magnesium 1.4 (*)    All other components within normal limits  SARS CORONAVIRUS 2 (HOSPITAL ORDER, [place] [facility])  BRAIN NATRIURETIC PEPTIDE  TSH    EKG EKG Interpretation  Date/Time:  Tuesday February 21 2019 15:39:34 EDT Ventricular Rate:  147 PR Interval:    QRS Duration: 76 QT Interval:  328 QTC Calculation: 513 R Axis:   20 Text Interpretation:  Atrial flutter with variable A-V block Nonspecific ST and T wave abnormality Abnormal ECG Confirmed by Virgel Manifold [phone]) on 02/21/2019 4:20:23 PM   Radiology Dg Chest Portable 1 View  Result Date: 02/21/2019 CLINICAL DATA:  Dyspnea. New onset of atrial fibrillation. EXAM: PORTABLE CHEST 1 VIEW COMPARISON:  03/01/2017 FINDINGS: The heart size and mediastinal contours are within normal limits. Both lungs are clear. Arthritic changes of both shoulders. Aortic atherosclerosis. IMPRESSION: 1. No acute cardiopulmonary disease. 2. Aortic atherosclerosis. Electronically Signed   By: Lorriane Shire M.D.   On: 02/21/2019 16:51    Procedures Procedures (including critical care time)  CRITICAL CARE Performed by: Virgel Manifold  Total critical care time: 35 minutes Critical care time was exclusive of separately billable procedures and treating other patients. Critical care was necessary to treat or prevent imminent or life-threatening deterioration. Critical care was time spent personally by me on the following activities: development of treatment plan with patient and/or surrogate as well as nursing, discussions with consultants, evaluation of patient's response to treatment, examination of patient, obtaining history from patient or surrogate, ordering and performing treatments and interventions, ordering and review of laboratory studies, ordering and review of radiographic studies, pulse oximetry and re-evaluation of patient's condition.   Medications Ordered in ED Medications  diltiazem (CARDIZEM) 1 mg/mL load via infusion 20 mg (has no administration in time range)    And  diltiazem (CARDIZEM) 100 mg in dextrose 5 % 100 mL (1 mg/mL) infusion (has no administration in time range)     Initial Impression / Assessment and Plan / ED Course  I have reviewed the triage vital signs and the nursing notes.  Pertinent labs & imaging results that were available during my care of the patient were reviewed by me and considered in my medical decision making (see chart for details).        72yM with afib with RVR. New diagnosis. Unclear when exact onset but suspect several weeks ago based on symptoms. Not candidate for ED cardioversion. I suspect this is the etiology of generalized weakness and dyspnea. Denies CP. No diagnosed hx of CAD. Low risk stress test 03/2017. Denies etoh.  Plan cardizem bolus and gtt. Check electrolytes. TSH. Doesn't seem volume overloaded but will check BNP. CXR. No overt infectious symptoms. Doubt COVID but will check for SARS-CoV-2 with anticipated admission.   CHADSVASC: 3 (age, htn, dm).   Final Clinical Impressions(s) / ED Diagnoses   Final diagnoses:  Atrial fibrillation with rapid  ventricular response [facility])    ED Discharge Orders    None       Virgel Manifold, MD 02/23/19 0000

## 2019-02-21 NOTE — Progress Notes (Signed)
ANTICOAGULATION CONSULT NOTE - Initial Consult  Pharmacy Consult for heparin gtt  Indication: atrial fibrillation  Allergies  Allergen Reactions  . Demerol Nausea And Vomiting  . Vasotec [Enalapril] Cough    Patient Measurements:   Heparin Dosing Weight:     Vital Signs: Temp: 97.6 F (36.4 C) (07/28 1403) BP: 140/76 (07/28 2030) Pulse Rate: 81 (07/28 2030)  Labs: Recent Labs    02/21/19 1603  HGB 11.4*  HCT 33.7*  PLT 167  CREATININE 1.69*    Estimated Creatinine Clearance: 45.8 mL/min (A) (by C-G formula based on SCr of 1.69 mg/dL (H)).   Medical History: Past Medical History:  Diagnosis Date  . Arthritis   . Cramps of left lower extremity   . Depression   . Diabetes mellitus    type 2 for 7-8 yrs  . Dyspnea   . Elevated liver enzymes   . Fatty liver   . History of kidney stones   . Hyperlipidemia   . Hypertension   . Vertigo     Medications:  (Not in a hospital admission)  Scheduled:  . heparin  4,000 Units Intravenous Once   Infusions:  . diltiazem (CARDIZEM) infusion 5 mg/hr (02/21/19 1829)  . heparin     PRN:  Anti-infectives (From admission, onward)   None      Assessment: Jack Mejia a 73 y.o. male requires anticoagulation with a heparin iv infusion for the indication of  atrial fibrillation. Heparin gtt will be started following pharmacy protocol per pharmacy consult. Patient is not on previous oral anticoagulant that will require aPTT/HL correlation before transitioning to only HL monitoring.   Goal of Therapy:  Heparin level 0.3-0.7 units/ml Monitor platelets by anticoagulation protocol: Yes   Plan:  Give 4000 units bolus x 1 Start heparin infusion at 1400 units/hr Check anti-Xa level in 8 hours and daily while on heparin Continue to monitor H&H and platelets  Heparin level to be drawn in 8 hours for patients >16 years old or crcl < 41ml/min  Jack Mejia 02/21/2019,8:37 PM

## 2019-02-21 NOTE — H&P (Signed)
TRH H&P    Patient Demographics:    Jack Mejia, is a 73 y.o. male  MRN: [id number]  DOB - 1945-11-21  Admit Date - 02/21/2019  Referring MD/NP/PA: Mila Merry  Outpatient Primary MD for the patient is Wolfgang Phoenix, Grace Bushy, MD  Patient coming from: PCP office  Chief complaint-shortness of breath   HPI:    Jack Mejia  is a 73 y.o. male, with a history of diabetes mellitus type 2, hyperlipidemia, hypertension, fatty liver, depression came to ED with chief complaint of fatigue and shortness of breath for past couple of months.  Patient says that he feels worse with exertion.  He recently saw his rheumatologist who recommended he go to the ED due to resting tachycardia.  However patient went to his PCP.  In office EKG showed A. fib with RVR.  Patient was sent to ED for further evaluation. He denies chest pain. Patient had episode of syncope on Thursday, while he was taking the dog out and when he stood up he felt lightheaded and a brief loss of consciousness. Has been feeling fatigued and short of breath on exertion. He does not have a history of stroke or seizures. No history of CAD, no history of CHF In the ED patient was started on IV Cardizem, also found to have low magnesium 1.4.  2 g mag sulfate given in the ED.    Review of systems:    In addition to the HPI above,    All other systems reviewed and are negative.    Past History of the following :    Past Medical History:  Diagnosis Date  . Arthritis   . Cramps of left lower extremity   . Depression   . Diabetes mellitus    type 2 for 7-8 yrs  . Dyspnea   . Elevated liver enzymes   . Fatty liver   . History of kidney stones   . Hyperlipidemia   . Hypertension   . Vertigo       Past Surgical History:  Procedure Laterality Date  . CARPAL TUNNEL RELEASE     both wrist  . cataract surgery     bilateral  . CHOLECYSTECTOMY    .  COLON SURGERY    . EYE SURGERY    . LUMBAR LAMINECTOMY/DECOMPRESSION MICRODISCECTOMY N/A 08/06/2016   Procedure: LUMBAR THREE- LUMBAR FIVE  DECOMPRESSIVE LUMBAR LAMINECTOMY;  Surgeon: Jovita Gamma, MD;  Location: [place];  Service: Neurosurgery;  Laterality: N/A;      Social History:      Social History   Tobacco Use  . Smoking status: Former Smoker    Types: Cigarettes    Quit date: 10/14/1982    Years since quitting: 36.3  . Smokeless tobacco: Never Used  . Tobacco comment: 29 yrs ago  Substance Use Topics  . Alcohol use: No       Family History :     Family History  Problem Relation Age of Onset  . Diabetes Mother   . Cancer Mother   . Diabetes Sister   .  Diabetes Maternal Grandmother       Home Medications:   Prior to Admission medications   Medication Sig Start Date End Date Taking? Authorizing Provider  amLODipine (NORVASC) 5 MG tablet TAKE 1 TABLET BY MOUTH EVERY DAY Patient taking differently: Take 5 mg by mouth daily.  10/21/18  Yes [name], MD  doxycycline (VIBRA-TABS) 100 MG tablet Take 1 tablet (100 mg total) by mouth 2 (two) times daily. 02/13/19  Yes [name], MD  glipiZIDE (GLUCOTROL) 5 MG tablet TAKE 2 TABLETS BY MOUTH TWICE A DAY Patient taking differently: Take 10 mg by mouth 2 (two) times daily before a meal.  11/01/18  Yes [name], MD  hydrochlorothiazide (HYDRODIURIL) 25 MG tablet TAKE 1 TABLET BY MOUTH EVERY DAY Patient taking differently: Take 25 mg by mouth daily.  11/23/18  Yes [name], MD  losartan (COZAAR) 50 MG tablet TAKE 1 TABLET BY MOUTH TWICE A DAY Patient taking differently: Take 50 mg by mouth 2 (two) times a day.  01/26/19  Yes [name], MD  metFORMIN (GLUCOPHAGE) 500 MG tablet TAKE 2 TABLETS BY MOUTH TWICE A DAY Patient taking differently: Take 1,000 mg by mouth 2 (two) times daily with a meal.  11/01/18  Yes [name], MD  Omega-3 Fatty Acids (OMEGA-3 FISH OIL PO) Take 1 capsule by  mouth daily.    Yes [provider]  predniSONE (DELTASONE) 10 MG tablet Take 1 tablet (10 mg total) by mouth daily with breakfast. Patient taking differently: Take 5 mg by mouth 2 (two) times daily with a meal.  09/08/18  Yes [name], MD     Allergies:     Allergies  Allergen Reactions  . Demerol Nausea And Vomiting  . Vasotec [Enalapril] Cough     Physical Exam:   Vitals  Blood pressure 135/78, pulse 80, resp. rate 20, SpO2 98 %.  1.  General: Appears in no acute distress  2. Psychiatric: Alert, oriented x3, intact insight and judgment  3. Neurologic: Cranial nerves II to XII grossly intact, motor strength 5/5 in all extremities  4. HEENMT:  Atraumatic normocephalic, extraocular muscles are intact  5. Respiratory : Clear to auscultation bilaterally  6. Cardiovascular : S1-S2, irregular, no murmur auscultated  7. Gastrointestinal:  Abdomen is soft, nontender, no organomegaly  8. Skin:  No rashes noted      Data Review:    CBC Recent Labs  Lab 02/21/19 1603  WBC 6.1  HGB 11.4*  HCT 33.7*  PLT 167  MCV 104.3*  MCH 35.3*  MCHC 33.8  RDW 13.7  LYMPHSABS 1.2  MONOABS 1.1*  EOSABS 0.0  BASOSABS 0.0   ------------------------------------------------------------------------------------------------------------------  Results for orders placed or performed during the hospital encounter of 02/21/19 (from the past 48 hour(s))  CBC with Differential     Status: Abnormal   Collection Time: 02/21/19  4:03 PM  Result Value Ref Range   WBC 6.1 4.0 - 10.5 K/uL   RBC 3.23 (L) 4.22 - 5.81 MIL/uL   Hemoglobin 11.4 (L) 13.0 - 17.0 g/dL   HCT 33.7 (L) 39.0 - 52.0 %   MCV 104.3 (H) 80.0 - 100.0 fL   MCH 35.3 (H) 26.0 - 34.0 pg   MCHC 33.8 30.0 - 36.0 g/dL   RDW 13.7 11.5 - 15.5 %   Platelets 167 150 - 400 K/uL   nRBC 0.0 0.0 - 0.2 %   Neutrophils Relative % 61 %   Neutro  Abs 3.7 1.7 - 7.7 K/uL   Lymphocytes Relative 20 %   Lymphs Abs 1.2  0.7 - 4.0 K/uL   Monocytes Relative 18 %   Monocytes Absolute 1.1 (H) 0.1 - 1.0 K/uL   Eosinophils Relative 0 %   Eosinophils Absolute 0.0 0.0 - 0.5 K/uL   Basophils Relative 0 %   Basophils Absolute 0.0 0.0 - 0.1 K/uL   Immature Granulocytes 1 %   Abs Immature Granulocytes 0.08 (H) 0.00 - 0.07 K/uL    Comment: Performed at [facility], [address] [place], [place] [address]  Basic metabolic panel     Status: Abnormal   Collection Time: 02/21/19  4:03 PM  Result Value Ref Range   Sodium 136 135 - 145 mmol/L   Potassium 3.8 3.5 - 5.1 mmol/L   Chloride 103 98 - 111 mmol/L   CO2 19 (L) 22 - 32 mmol/L   Glucose, Bld 244 (H) 70 - 99 mg/dL   BUN 33 (H) 8 - 23 mg/dL   Creatinine, Ser 1.69 (H) 0.61 - 1.24 mg/dL   Calcium 9.0 8.9 - 10.3 mg/dL   GFR calc non Af Amer 40 (L) >60 mL/min   GFR calc Af Amer 46 (L) >60 mL/min   Anion gap 14 5 - 15    Comment: Performed at [facility], [address] [place], [place] [address]  Magnesium     Status: Abnormal   Collection Time: 02/21/19  4:03 PM  Result Value Ref Range   Magnesium 1.4 (L) 1.7 - 2.4 mg/dL    Comment: Performed at [facility], [address], [place], [place] [address]  Brain natriuretic peptide     Status: None   Collection Time: 02/21/19  4:03 PM  Result Value Ref Range   B Natriuretic Peptide 100.0 0.0 - 100.0 pg/mL    Comment: Performed at [facility], [address], [place], [place] [address]  TSH     Status: None   Collection Time: 02/21/19  4:08 PM  Result Value Ref Range   TSH 0.740 0.350 - 4.500 uIU/mL    Comment: Performed by a 3rd Generation assay with a functional sensitivity of <=0.01 uIU/mL. Performed at [facility], [address] [place],  [address]     Chemistries  Recent Labs  Lab 02/21/19 1603  NA 136  K 3.8  CL 103  CO2 19*  GLUCOSE 244*  BUN 33*  CREATININE 1.69*  CALCIUM 9.0  MG 1.4*    ------------------------------------------------------------------------------------------------------------------  ------------------------------------------------------------------------------------------------------------------ GFR: Estimated Creatinine Clearance: 45.8 mL/min (A) (by C-G formula based on SCr of 1.69 mg/dL (H)). Liver Function Tests: No results for input(s): AST, ALT, ALKPHOS, BILITOT, PROT, ALBUMIN in the last 168 hours. No results for input(s): LIPASE, AMYLASE in the last 168 hours. No results for input(s): AMMONIA in the last 168 hours. Coagulation Profile: No results for input(s): INR, PROTIME in the last 168 hours. Cardiac Enzymes: No results for input(s): CKTOTAL, CKMB, CKMBINDEX, TROPONINI in the last 168 hours. BNP (last 3 results) No results for input(s): PROBNP in the last 8760 hours. HbA1C: No results for input(s): HGBA1C in the last 72 hours. CBG: No results for input(s): GLUCAP in the last 168 hours. Lipid Profile: No results for input(s): CHOL, HDL, LDLCALC, TRIG, CHOLHDL, LDLDIRECT in the last 72 hours. Thyroid Function Tests: Recent Labs    02/21/19 1608  TSH 0.740   Anemia Panel: No results for input(s): VITAMINB12, FOLATE, FERRITIN, TIBC, IRON, RETICCTPCT in the last 72  hours.  --------------------------------------------------------------------------------------------------------------- Urine analysis:    Component Value Date/Time   COLORURINE YELLOW 04/15/2017 [place] 04/15/2017 1734   LABSPEC 1.022 04/15/2017 1734   PHURINE 5.0 04/15/2017 1734   GLUCOSEU >=500 (A) 04/15/2017 1734   HGBUR SMALL (A) 04/15/2017 1734   BILIRUBINUR NEGATIVE 04/15/2017 1734   KETONESUR NEGATIVE 04/15/2017 1734   PROTEINUR 30 (A) 04/15/2017 1734   NITRITE NEGATIVE 04/15/2017 1734   LEUKOCYTESUR NEGATIVE 04/15/2017 1734      Imaging Results:    Dg Chest Portable 1 View  Result Date: 02/21/2019 CLINICAL DATA:  Dyspnea. New onset of  atrial fibrillation. EXAM: PORTABLE CHEST 1 VIEW COMPARISON:  03/01/2017 FINDINGS: The heart size and mediastinal contours are within normal limits. Both lungs are clear. Arthritic changes of both shoulders. Aortic atherosclerosis. IMPRESSION: 1. No acute cardiopulmonary disease. 2. Aortic atherosclerosis. Electronically Signed   By: Lorriane Shire M.D.   On: 02/21/2019 16:51    My personal review of EKG: Rhythm atrial fibrillation   Assessment & Plan:    Active Problems:   New onset atrial fibrillation ([place])   1. New onset atrial fibrillation-  CHA2DS2VASc score is 3 (age, diabetes mellitus, hypertension), patient is currently on IV Cardizem.  We will continue with Cardizem for rate control.  Will initiate heparin for anticoagulation.  Will need to discuss anticoagulation options including Xarelto versus Eliquis versus Coumadin at the time of discharge.  Consult cardiology in a.m.  Will obtain echocardiogram in a.m.  Chest x-ray is clear, BNP 100.  2. Diabetes mellitus type 2-we will initiate sliding scale insulin with NovoLog.  Check hemoglobin A1c.  Will hold metformin and Glucotrol.  3. ?  Polymyalgia rheumatica-patient is on prednisone 5 mg p.o. twice daily.  We will continue with prednisone.  4. Recently diagnosed cellulitis-patient is currently on doxycycline, has 1 more day of therapy left.  5. Hypertension-blood pressure is controlled, started on IV Cardizem.  Will hold amlodipine, HCTZ, losartan.  6. Acute kidney injury versus CKD stage III-patient's baseline creatinine is 1.29 as of October 2019, today creatinine is 1.69.  Could be medication induced versus progression of kidney disease.  Hold HCTZ lisinopril.  Follow renal function in a.m.  7. Hypomagnesemia-magnesium is 1.4, 2 g mag sulfate ordered in the ED.  We will recheck magnesium level in a.m.    DVT Prophylaxis-   Heparin  AM Labs Ordered, also please review Full Orders  Family Communication: Admission, patients  condition and plan of care including tests being ordered have been discussed with the patient who indicate understanding and agree with the plan and Code Status.  Code Status: Full code  Admission status: Inpatient: Based on patients clinical presentation and evaluation of above clinical data, I have made determination that patient meets Inpatient criteria at this time.  Time spent in minutes : 60 minutes   Oswald Hillock M.D on 02/21/2019 at 8:06 PM

## 2019-02-21 NOTE — Progress Notes (Signed)
   Subjective:    Patient ID: Jack Mejia, male    DOB: 1946-05-25, 73 y.o.   MRN: [id number]  Atrial Fibrillation Symptoms include shortness of breath. Symptoms are negative for chest pain and dizziness. Past medical history includes atrial fibrillation.  pt here due to being at rheumatologist this morning and pulse was 140. Nurse at rheumatology advised ER but patient refused.  Patient relates that he has had a couple weeks of not feeling good feeling low energy denies high fever chills sweats denies chest tightness pressure pain no COVID symptoms Patient states he did get up last week and felt woozy and fell and passed out briefly  Review of Systems  Constitutional: Positive for fatigue. Negative for diaphoresis.  HENT: Negative for congestion and rhinorrhea.   Respiratory: Positive for shortness of breath. Negative for cough, chest tightness and wheezing.   Cardiovascular: Negative for chest pain and leg swelling.  Gastrointestinal: Negative for abdominal pain and diarrhea.  Skin: Negative for color change and rash.  Neurological: Negative for dizziness and headaches.  Psychiatric/Behavioral: Negative for behavioral problems and confusion.       Objective:   Physical Exam Vitals signs reviewed.  Constitutional:      General: He is not in acute distress. HENT:     Head: Normocephalic and atraumatic.  Eyes:     General:        Right eye: No discharge.        Left eye: No discharge.  Neck:     Trachea: No tracheal deviation.  Cardiovascular:     Rate and Rhythm: Tachycardia present. Rhythm irregular.     Heart sounds: Normal heart sounds. No murmur.  Pulmonary:     Effort: Pulmonary effort is normal. No respiratory distress.     Breath sounds: Normal breath sounds.  Lymphadenopathy:     Cervical: No cervical adenopathy.  Skin:    General: Skin is warm and dry.  Neurological:     Mental Status: He is alert.     Coordination: Coordination normal.  Psychiatric:        Behavior: Behavior normal.     EKG shows atrial flutter with rapid ventricular response  25 minutes was spent with the patient.  This statement verifies that 25 minutes was indeed spent with the patient.  More than 50% of this visit-total duration of the visit-was spent in counseling and coordination of care. The issues that the patient came in for today as reflected in the diagnosis (s) please refer to documentation for further details.     Assessment & Plan:  Syncope New onset atrial fib with rapid ventricular response I did speak with his cardiologist Highly recommended for the patient to go to ER for further evaluation probable admission as well as being put on medication to regulate heart rate and help prevent stroke

## 2019-02-22 ENCOUNTER — Other Ambulatory Visit: Payer: Self-pay | Admitting: Family Medicine

## 2019-02-22 ENCOUNTER — Inpatient Hospital Stay (HOSPITAL_COMMUNITY): Payer: Medicare HMO

## 2019-02-22 DIAGNOSIS — I10 Essential (primary) hypertension: Secondary | ICD-10-CM | POA: Diagnosis not present

## 2019-02-22 DIAGNOSIS — I361 Nonrheumatic tricuspid (valve) insufficiency: Secondary | ICD-10-CM

## 2019-02-22 DIAGNOSIS — I4891 Unspecified atrial fibrillation: Secondary | ICD-10-CM

## 2019-02-22 LAB — CBC
HCT: 31.1 % — ABNORMAL LOW (ref 39.0–52.0)
Hemoglobin: 10.3 g/dL — ABNORMAL LOW (ref 13.0–17.0)
MCH: 34.7 pg — ABNORMAL HIGH (ref 26.0–34.0)
MCHC: 33.1 g/dL (ref 30.0–36.0)
MCV: 104.7 fL — ABNORMAL HIGH (ref 80.0–100.0)
Platelets: 149 10*3/uL — ABNORMAL LOW (ref 150–400)
RBC: 2.97 MIL/uL — ABNORMAL LOW (ref 4.22–5.81)
RDW: 13.8 % (ref 11.5–15.5)
WBC: 5.6 10*3/uL (ref 4.0–10.5)
nRBC: 0 % (ref 0.0–0.2)

## 2019-02-22 LAB — COMPREHENSIVE METABOLIC PANEL
ALT: 22 U/L (ref 0–44)
AST: 18 U/L (ref 15–41)
Albumin: 3.4 g/dL — ABNORMAL LOW (ref 3.5–5.0)
Alkaline Phosphatase: 55 U/L (ref 38–126)
Anion gap: 10 (ref 5–15)
BUN: 30 mg/dL — ABNORMAL HIGH (ref 8–23)
CO2: 21 mmol/L — ABNORMAL LOW (ref 22–32)
Calcium: 8.6 mg/dL — ABNORMAL LOW (ref 8.9–10.3)
Chloride: 104 mmol/L (ref 98–111)
Creatinine, Ser: 1.37 mg/dL — ABNORMAL HIGH (ref 0.61–1.24)
GFR calc Af Amer: 59 mL/min — ABNORMAL LOW (ref >60)
GFR calc non Af Amer: 51 mL/min — ABNORMAL LOW (ref >60)
Glucose, Bld: 178 mg/dL — ABNORMAL HIGH (ref 70–99)
Potassium: 3.6 mmol/L (ref 3.5–5.1)
Sodium: 135 mmol/L (ref 135–145)
Total Bilirubin: 1 mg/dL (ref 0.3–1.2)
Total Protein: 6.3 g/dL — ABNORMAL LOW (ref 6.5–8.1)

## 2019-02-22 LAB — HEPARIN LEVEL (UNFRACTIONATED): Heparin Unfractionated: 0.78 IU/mL — ABNORMAL HIGH (ref 0.30–0.70)

## 2019-02-22 LAB — MAGNESIUM: Magnesium: 1.7 mg/dL (ref 1.7–2.4)

## 2019-02-22 LAB — HEMOGLOBIN A1C
Hgb A1c MFr Bld: 9 % — ABNORMAL HIGH (ref 4.8–5.6)
Mean Plasma Glucose: 211.6 mg/dL

## 2019-02-22 LAB — ECHOCARDIOGRAM COMPLETE
Height: 70 in
Weight: 3333.36 oz

## 2019-02-22 LAB — GLUCOSE, CAPILLARY
Glucose-Capillary: 177 mg/dL — ABNORMAL HIGH (ref 70–99)
Glucose-Capillary: 289 mg/dL — ABNORMAL HIGH (ref 70–99)

## 2019-02-22 LAB — MRSA PCR SCREENING: MRSA by PCR: NEGATIVE

## 2019-02-22 MED ORDER — APIXABAN 5 MG PO TABS: 5.0000 mg | ORAL_TABLET | Freq: Two times a day (BID) | ORAL | 0 refills | Status: DC

## 2019-02-22 MED ORDER — METOPROLOL TARTRATE 25 MG PO TABS
12.5000 mg | ORAL_TABLET | Freq: Two times a day (BID) | ORAL | Status: DC
  Administered 2019-02-22: 12.5 mg via ORAL
  Filled 2019-02-22: qty 1

## 2019-02-22 MED ORDER — METOPROLOL TARTRATE 25 MG PO TABS: 25.0000 mg | ORAL_TABLET | Freq: Two times a day (BID) | ORAL | 0 refills | Status: DC

## 2019-02-22 MED ORDER — PANTOPRAZOLE SODIUM 40 MG PO TBEC: 40.0000 mg | DELAYED_RELEASE_TABLET | Freq: Every day | ORAL | 0 refills | Status: DC

## 2019-02-22 MED ORDER — APIXABAN 5 MG PO TABS
5.0000 mg | ORAL_TABLET | Freq: Two times a day (BID) | ORAL | Status: DC
  Administered 2019-02-22: 5 mg via ORAL
  Filled 2019-02-22: qty 1

## 2019-02-22 MED ORDER — METOPROLOL TARTRATE 25 MG PO TABS: 25.0000 mg | ORAL_TABLET | Freq: Two times a day (BID) | ORAL | Status: DC

## 2019-02-22 MED ORDER — MAGNESIUM SULFATE 2 GM/50ML IV SOLN
2.0000 g | Freq: Once | INTRAVENOUS | Status: AC
  Administered 2019-02-22: 2 g via INTRAVENOUS
  Filled 2019-02-22: qty 50

## 2019-02-22 NOTE — Progress Notes (Signed)
ANTICOAGULATION CONSULT NOTE - Initial Consult  Pharmacy Consult for heparin gtt  Indication: atrial fibrillation  Allergies  Allergen Reactions  . Demerol Nausea And Vomiting  . Vasotec [Enalapril] Cough    Patient Measurements: Height: 5\' 10"  (177.8 cm) Weight: 208 lb 5.4 oz (94.5 kg) IBW/kg (Calculated) : 73 Heparin Dosing Weight: HEPARIN DW (KG): 92.5   Vital Signs: Temp: 98.1 F (36.7 C) (07/29 0000) Temp Source: Oral (07/29 0000) BP: 132/69 (07/29 0600) Pulse Rate: 67 (07/29 0600)  Labs: Recent Labs    02/21/19 1603 02/22/19 0514  HGB 11.4* 10.3*  HCT 33.7* 31.1*  PLT 167 149*  HEPARINUNFRC  --  0.78*  CREATININE 1.69* 1.37*    Estimated Creatinine Clearance: 56.3 mL/min (A) (by C-G formula based on SCr of 1.37 mg/dL (H)).   Medical History: Past Medical History:  Diagnosis Date  . Arthritis   . Cramps of left lower extremity   . Depression   . Diabetes mellitus    type 2 for 7-8 yrs  . Dyspnea   . Elevated liver enzymes   . Fatty liver   . History of kidney stones   . Hyperlipidemia   . Hypertension   . Vertigo     Medications:  Medications Prior to Admission  Medication Sig Dispense Refill Last Dose  . amLODipine (NORVASC) 5 MG tablet TAKE 1 TABLET BY MOUTH EVERY DAY (Patient taking differently: Take 5 mg by mouth daily. ) 90 tablet 1 02/21/2019 at Unknown time  . doxycycline (VIBRA-TABS) 100 MG tablet Take 1 tablet (100 mg total) by mouth 2 (two) times daily. 20 tablet 0 02/21/2019 at Unknown time  . glipiZIDE (GLUCOTROL) 5 MG tablet TAKE 2 TABLETS BY MOUTH TWICE A DAY (Patient taking differently: Take 10 mg by mouth 2 (two) times daily before a meal. ) 360 tablet 1 02/21/2019 at Unknown time  . hydrochlorothiazide (HYDRODIURIL) 25 MG tablet TAKE 1 TABLET BY MOUTH EVERY DAY (Patient taking differently: Take 25 mg by mouth daily. ) 90 tablet 1 02/21/2019 at Unknown time  . losartan (COZAAR) 50 MG tablet TAKE 1 TABLET BY MOUTH TWICE A DAY (Patient  taking differently: Take 50 mg by mouth 2 (two) times a day. ) 180 tablet 1 02/21/2019 at Unknown time  . metFORMIN (GLUCOPHAGE) 500 MG tablet TAKE 2 TABLETS BY MOUTH TWICE A DAY (Patient taking differently: Take 1,000 mg by mouth 2 (two) times daily with a meal. ) 360 tablet 1 02/21/2019 at Unknown time  . Omega-3 Fatty Acids (OMEGA-3 FISH OIL PO) Take 1 capsule by mouth daily.    02/21/2019 at Unknown time  . predniSONE (DELTASONE) 10 MG tablet Take 1 tablet (10 mg total) by mouth daily with breakfast. (Patient taking differently: Take 5 mg by mouth 2 (two) times daily with a meal. ) 30 tablet 0 02/21/2019 at Unknown time   Scheduled:  . Chlorhexidine Gluconate Cloth  6 each Topical Daily  . doxycycline  100 mg Oral BID  . insulin aspart  0-9 Units Subcutaneous TID WC  . predniSONE  5 mg Oral BID WC   Infusions:  . sodium chloride    . diltiazem (CARDIZEM) infusion 12.5 mg/hr (02/22/19 0600)  . heparin 1,400 Units/hr (02/22/19 0600)   PRN:  Anti-infectives (From admission, onward)   Start     Dose/Rate Route Frequency Ordered Stop   02/21/19 2200  doxycycline (VIBRA-TABS) tablet 100 mg     100 mg Oral 2 times daily 02/21/19 2109  Assessment: PER BEAGLEY a 73 y.o. male requires anticoagulation with a heparin iv infusion for the indication of  atrial fibrillation. Heparin gtt will be started following pharmacy protocol per pharmacy consult. Patient is not on previous oral anticoagulant that will require aPTT/HL correlation before transitioning to only HL monitoring.   HL 0.78, supratherapeutic   Goal of Therapy:  Heparin level 0.3-0.7 units/ml Monitor platelets by anticoagulation protocol: Yes   Plan:  Reduce heparin infusion to 1200 units/hr Check anti-Xa level in 8 hours and daily while on heparin Continue to monitor H&H and platelets  Heparin level to be drawn in 8 hours for patients >64 years old or crcl < 40ml/min  Jadis Pitter 02/22/2019,7:27 AM

## 2019-02-22 NOTE — Discharge Instructions (Signed)
Follow with Primary MD [name], MD in 7 days   Get CBC, CMP,checked  by Primary MD next visit.    Activity: As tolerated with Full fall precautions use walker/cane & assistance as needed   Disposition Home    Diet: Heart Healthy  , with feeding assistance and aspiration precautions.  For Heart failure patients - Check your Weight same time everyday, if you gain over 2 pounds, or you develop in leg swelling, experience more shortness of breath or chest pain, call your Primary MD immediately. Follow Cardiac Low Salt Diet and 1.5 lit/day fluid restriction.   On your next visit with your primary care physician please Get Medicines reviewed and adjusted.   Please request your Prim.MD to go over all Hospital Tests and Procedure/Radiological results at the follow up, please get all Hospital records sent to your Prim MD by signing hospital release before you go home.   If you experience worsening of your admission symptoms, develop shortness of breath, life threatening emergency, suicidal or homicidal thoughts you must seek medical attention immediately by calling 911 or calling your MD immediately  if symptoms less severe.  You Must read complete instructions/literature along with all the possible adverse reactions/side effects for all the Medicines you take and that have been prescribed to you. Take any new Medicines after you have completely understood and accpet all the possible adverse reactions/side effects.   Do not drive, operating heavy machinery, perform activities at heights, swimming or participation in water activities or provide baby sitting services if your were admitted for syncope or siezures until you have seen by Primary MD or a Neurologist and advised to do so again.  Do not drive when taking Pain medications.    Do not take more than prescribed Pain, Sleep and Anxiety Medications  Special Instructions: If you have smoked or chewed Tobacco  in the last 2 yrs  please stop smoking, stop any regular Alcohol  and or any Recreational drug use.  Wear Seat belts while driving.   Please note  You were cared for by a hospitalist during your hospital stay. If you have any questions about your discharge medications or the care you received while you were in the hospital after you are discharged, you can call the unit and asked to speak with the hospitalist on call if the hospitalist that took care of you is not available. Once you are discharged, your primary care physician will handle any further medical issues. Please note that NO REFILLS for any discharge medications will be authorized once you are discharged, as it is imperative that you return to your primary care physician (or establish a relationship with a primary care physician if you do not have one) for your aftercare needs so that they can reassess your need for medications and monitor your lab values.

## 2019-02-22 NOTE — Progress Notes (Signed)
Inpatient Diabetes Program Recommendations  AACE/ADA: New Consensus Statement on Inpatient Glycemic Control   Target Ranges:  Prepandial:   less than 140 mg/dL      Peak postprandial:   less than 180 mg/dL (1-2 hours)      Critically ill patients:  140 - 180 mg/dL   Results for SELDON, BARRELL (MRN [id number]) as of 02/22/2019 12:54  Ref. Range 02/21/2019 21:12 02/22/2019 07:54 02/22/2019 12:01  Glucose-Capillary Latest Ref Range: 70 - 99 mg/dL 177 (H) 177 (H) 289 (H)   Review of Glycemic Control  Diabetes history: DM2 Outpatient Diabetes medications: Metformin 1000 mg BID, Glipizide 10 mg BID; Prednisone 5 mg BID Current orders for Inpatient glycemic control: Novolog 0-9 units TID with meals; Prednisone 5 mg BID  Inpatient Diabetes Program Recommendations:   Correction (SSI): Please consider ordering Novolog 0-5 units QHS for bedtime correction.  Insulin - Meal Coverage: If steroids are continued, please consider ordering Novolog 3 units TID with meals for meal coverage if patient eats at least 50% of meals.  Thanks, Barnie Alderman, RN, MSN, CDE Diabetes Coordinator Inpatient Diabetes Program [phone] (Team Pager from 8am to 5pm)

## 2019-02-22 NOTE — Progress Notes (Signed)
*  PRELIMINARY RESULTS* Echocardiogram 2D Echocardiogram has been performed.  Jack Mejia 02/22/2019, 12:36 PM

## 2019-02-22 NOTE — Care Management CC44 (Signed)
Condition Code 44 Documentation Completed  Patient Details  Name: Jack Mejia MRN: [id number] Date of Birth: [birth year]   Condition Code 44 given:  Yes Patient signature on Condition Code 44 notice:  Yes Documentation of 2 MD's agreement:  Yes Code 44 added to claim:  Yes    Boneta Lucks, RN 02/22/2019, 4:11 PM

## 2019-02-22 NOTE — Consult Note (Addendum)
Cardiology Consult    Patient ID: Jack Mejia; [id number]; Oct 21, 1945   Admit date: 02/21/2019 Date of Consult: 02/22/2019  Primary Care Provider: [name], MD Primary Cardiologist: [name], MD   Patient Profile    Jack Mejia is a 73 y.o. male with past medical history of HTN, Type 2 DM, Stage 3 CKD, PMR, and recent treatment of presumed cellulitis who is being seen today for the evaluation of new-onset atrial fibrillation at the request of Dr. Darrick Meigs.   History of Present Illness    Jack Mejia was last examined by Dr. Bronson Mejia in 02/2017 as a new patient referral for dyspnea on exertion. Denied ay associated chest pain or palpitations at that time. BP was significantly elevated and he was continued on HCTZ and Losartan with the recommendation of adding Amlodipine if this remained elevated. Was also recommended to start Crestor 20mg  daily. An Exercise Myoview was ordered given his dyspnea on exertion and this showed no significant ischemia or scar, overall being a low-risk study.   He presented to his PCP's office yesterday after being at his Rheumatologist and found to be tachycardiac with HR in the 140's. EKG was performed in the office and showed atrial fibrillation with RVR by review of notes, therefore he was sent to the ED for further evaluation.   Initial labs show WBC 6.1, Hgb 11.4, platelets 167, Na+ 136, K+ 3.8, and creatinine 1.69 (previously 1.29 in 2019). Mg 1.4. BNP 100. TSH 0.740. COVID negative. CXR with no acute cardiopulmonary abnormalities. EKG shows atrial flutter with RVR, HR 147.  He was admitted for further management and started on IV Cardizem for rate-control along with Heparin for anticoagulation. He did receive Mg supplementation with repeat value at 1.7 this AM. Overnight, he converted back to NSR (exact moment not available for review as I am unable to pull up the Emergency Dept telemetry). He has maintained NSR overnight and this AM  with HR in the 70's to 80's. Does have frequent PAC's.  Remains on IV Cardizem at 7.5 mg/hr currently.   In talking with the patient, he reports worsening fatigue and dyspnea on exertion for the past 3-4 months. Would check his pulse at home and noted it was elevated in the 140's to 150's but says he did not tell his PCP as he was concerned he would make him go to the Emergency Dept. He denies any associated chest pain but had experienced occasional fluttering. No orthopnea or PND. Had noticed intermittent lower extremity edema.    Past Medical History:  Diagnosis Date   Arthritis    Cramps of left lower extremity    Depression    Diabetes mellitus    type 2 for 7-8 yrs   Dyspnea    Elevated liver enzymes    Fatty liver    History of kidney stones    Hyperlipidemia    Hypertension    Vertigo     Past Surgical History:  Procedure Laterality Date   CARPAL TUNNEL RELEASE     both wrist   cataract surgery     bilateral   CHOLECYSTECTOMY     EYE SURGERY     HEMORROIDECTOMY     LUMBAR LAMINECTOMY/DECOMPRESSION MICRODISCECTOMY N/A 08/06/2016   Procedure: LUMBAR THREE- LUMBAR FIVE  DECOMPRESSIVE LUMBAR LAMINECTOMY;  Surgeon: Jovita Gamma, MD;  Location: [place];  Service: Neurosurgery;  Laterality: N/A;     Home Medications:  Prior to Admission medications   Medication Sig Start  Date End Date Taking? Authorizing Provider  amLODipine (NORVASC) 5 MG tablet TAKE 1 TABLET BY MOUTH EVERY DAY Patient taking differently: Take 5 mg by mouth daily.  10/21/18  Yes [name], MD  doxycycline (VIBRA-TABS) 100 MG tablet Take 1 tablet (100 mg total) by mouth 2 (two) times daily. 02/13/19  Yes [name], MD  glipiZIDE (GLUCOTROL) 5 MG tablet TAKE 2 TABLETS BY MOUTH TWICE A DAY Patient taking differently: Take 10 mg by mouth 2 (two) times daily before a meal.  11/01/18  Yes [name], MD  hydrochlorothiazide (HYDRODIURIL) 25 MG tablet TAKE 1 TABLET BY MOUTH  EVERY DAY Patient taking differently: Take 25 mg by mouth daily.  11/23/18  Yes [name], MD  losartan (COZAAR) 50 MG tablet TAKE 1 TABLET BY MOUTH TWICE A DAY Patient taking differently: Take 50 mg by mouth 2 (two) times a day.  01/26/19  Yes [name], MD  metFORMIN (GLUCOPHAGE) 500 MG tablet TAKE 2 TABLETS BY MOUTH TWICE A DAY Patient taking differently: Take 1,000 mg by mouth 2 (two) times daily with a meal.  11/01/18  Yes [name], MD  Omega-3 Fatty Acids (OMEGA-3 FISH OIL PO) Take 1 capsule by mouth daily.    Yes [provider]  predniSONE (DELTASONE) 10 MG tablet Take 1 tablet (10 mg total) by mouth daily with breakfast. Patient taking differently: Take 5 mg by mouth 2 (two) times daily with a meal.  09/08/18  Yes [name], MD    Inpatient Medications: Scheduled Meds:  apixaban  5 mg Oral BID   Chlorhexidine Gluconate Cloth  6 each Topical Daily   doxycycline  100 mg Oral BID   insulin aspart  0-9 Units Subcutaneous TID WC   metoprolol tartrate  12.5 mg Oral BID   predniSONE  5 mg Oral BID WC   Continuous Infusions:  sodium chloride     magnesium sulfate bolus IVPB     PRN Meds: ondansetron **OR** ondansetron (ZOFRAN) IV  Allergies:    Allergies  Allergen Reactions   Demerol Nausea And Vomiting   Vasotec [Enalapril] Cough    Social History:   Social History   Socioeconomic History   Marital status: Divorced    Spouse name: Not on file   Number of children: Not on file   Years of education: Not on file   Highest education level: Not on file  Occupational History   Not on file  Social Needs   Financial resource strain: Not hard at all   Food insecurity    Worry: Never true    Inability: Never true   Transportation needs    Medical: No    Non-medical: No  Tobacco Use   Smoking status: Former Smoker    Types: Cigarettes    Quit date: 10/14/1982    Years since quitting: 36.3   Smokeless tobacco:  Never Used   Tobacco comment: 29 yrs ago  Substance and Sexual Activity   Alcohol use: No   Drug use: No   Sexual activity: Never    Birth control/protection: None  Lifestyle   Physical activity    Days per week: 0 days    Minutes per session: 0 min   Stress: Not at all  Relationships   Social connections    Talks on phone: More than three times a week    Gets together: More than three times a week    Attends religious service: Never  Active member of club or organization: No    Attends meetings of clubs or organizations: Never    Relationship status: Divorced   Intimate partner violence    Fear of current or ex partner: No    Emotionally abused: No    Physically abused: No    Forced sexual activity: No  Other Topics Concern   Not on file  Social History Narrative   Not on file     Family History:    Family History  Problem Relation Age of Onset   Diabetes Mother    Cancer Mother    Diabetes Sister    Diabetes Maternal Grandmother       Review of Systems    General:  No chills, fever, night sweats or weight changes. Positive for fatigue.  Cardiovascular:  No chest pain, edema, orthopnea, paroxysmal nocturnal dyspnea. Positive for palpitations and dyspnea on exertion.  Dermatological: No rash, lesions/masses Respiratory: No cough, dyspnea Urologic: No hematuria, dysuria Abdominal:   No nausea, vomiting, diarrhea, bright red blood per rectum, melena, or hematemesis Neurologic:  No visual changes, wkns, changes in mental status. All other systems reviewed and are otherwise negative except as noted above.  Physical Exam/Data    Vitals:   02/22/19 0845 02/22/19 0900 02/22/19 0915 02/22/19 0930  BP: 138/81 (!) 157/55 (!) 152/87 (!) 133/115  Pulse: 77 83 (!) 39 84  Resp: 16 18 19 20   Temp:      TempSrc:      SpO2: 98% 96% 98% 98%  Weight:      Height:        Intake/Output Summary (Last 24 hours) at 02/22/2019 1033 Last data filed at  02/22/2019 0600 Gross per 24 hour  Intake 109.41 ml  Output 650 ml  Net -540.59 ml   Filed Weights   02/21/19 2353 02/22/19 0500  Weight: 95.5 kg 94.5 kg   Body mass index is 29.89 kg/m.   General: Pleasant male appearing in NAD Psych: Normal affect. Neuro: Alert and oriented X 3. Moves all extremities spontaneously. HEENT: Normal  Neck: Supple without bruits or JVD. Lungs:  Resp regular and unlabored, CTA without wheezing or rales. Heart: RRR with occasional ectopic beats. No s3, s4, or murmurs. Abdomen: Soft, non-tender, non-distended, BS + x 4.  Extremities: No clubbing or cyanosis. Trace lower extremity edema. DP/PT/Radials 2+ and equal bilaterally.   EKG:  The EKG was personally reviewed and demonstrates: Atrial flutter with RVR, HR 147.  Telemetry:  Telemetry was personally reviewed and demonstrates: NSR, HR in 70's to 80's with frequent PAC's.    Labs/Studies     Relevant CV Studies:  Echocardiogram: Pending  NST: 03/2017  Blood pressure demonstrated a hypertensive response to exercise.  This is a low risk study. No perfusion imaging suggestive of significant ischemia or scar.  Nuclear stress EF: 52%.  Diffuse nonspecific ST-T abnormalities in inferolateral leads, with less than 1 mm horizontal depressions with stress.  Laboratory Data:  Chemistry Recent Labs  Lab 02/21/19 1603 02/22/19 0514  NA 136 135  K 3.8 3.6  CL 103 104  CO2 19* 21*  GLUCOSE 244* 178*  BUN 33* 30*  CREATININE 1.69* 1.37*  CALCIUM 9.0 8.6*  GFRNONAA 40* 51*  GFRAA 46* 59*  ANIONGAP 14 10    Recent Labs  Lab 02/22/19 0514  PROT 6.3*  ALBUMIN 3.4*  AST 18  ALT 22  ALKPHOS 55  BILITOT 1.0   Hematology Recent Labs  Lab 02/21/19 1603 02/22/19  0514  WBC 6.1 5.6  RBC 3.23* 2.97*  HGB 11.4* 10.3*  HCT 33.7* 31.1*  MCV 104.3* 104.7*  MCH 35.3* 34.7*  MCHC 33.8 33.1  RDW 13.7 13.8  PLT 167 149*   Cardiac EnzymesNo results for input(s): TROPONINI in the last  168 hours. No results for input(s): TROPIPOC in the last 168 hours.  BNP Recent Labs  Lab 02/21/19 1603  BNP 100.0    DDimer No results for input(s): DDIMER in the last 168 hours.  Radiology/Studies:  Dg Chest Portable 1 View  Result Date: 02/21/2019 CLINICAL DATA:  Dyspnea. New onset of atrial fibrillation. EXAM: PORTABLE CHEST 1 VIEW COMPARISON:  03/01/2017 FINDINGS: The heart size and mediastinal contours are within normal limits. Both lungs are clear. Arthritic changes of both shoulders. Aortic atherosclerosis. IMPRESSION: 1. No acute cardiopulmonary disease. 2. Aortic atherosclerosis. Electronically Signed   By: Lorriane Shire M.D.   On: 02/21/2019 16:51     Assessment & Plan    1. New-Onset Atrial Flutter - reports having an elevated HR in the 140's to 150's when checked at home for the past 3-4 months with associated dyspnea, fatigue, and occasional palpitations. Did not present to his PCP for evaluation until yesterday and found to be in new-onset atrial fibrillation/flutter with RVR.  - Hgb 11.4,  Na+ 136, K+ 3.8, and Mg 1.4. BNP 100. TSH 0.740. COVID negative.  - he converted back to NSR yesterday evening while on IV Cardizem and has maintained NSR, HR in 70's to 80's with occasional PVC's. Will discontinue IV Cardizem and start Lopressor 12.5mg  BID which can be titrated as needed (prefer BB for now until his EF can be assessed and given the use of Amlodipine as an outpatient).  - This patients CHA2DS2-VASc Score and unadjusted Ischemic Stroke Rate (% per year) is equal to 4.8 % stroke rate/year from a score of 4 (HTN, DM, Aortic Calcifications by prior CT, age). Will stop Heparin and start Eliquis 5mg  BID. Will consult Case Management for 30-day card and benefits check.  - he is very anxious to go home today and I reviewed with him this will depend on his echo results and how his HR responds with PO medications.   2. HTN - BP has been variable at 108/55 - 176/115 since admission.  PTA Amlodipine, Losartan, and HCTZ held at time of admission given his creatinine and requiring IV Cardizem at that time. Can restart home regimen as needed pending BP response with discontinuing IV Cardizem.   3. Stage 3 CKD - creatinine elevated to 1.69 on admission, improved to 1.37 today.   4. Hypomagnesemia - Mg 1.4 on admission. Received replacement and at 1.7 on recheck. Will order additional supplementation this AM. Will need to be rechecked as an outpatient.   For questions or updates, please contact [place] Please consult [email or web address] for contact info under Cardiology/STEMI.  Signed, Erma Heritage, PA-C 02/22/2019, 10:33 AM Pager: [phone]   Attending note Patient seen and discussed with PA Ahmed Prima, I agree with her documentation above. 73 yo male history of HTN,DM2, HL, admitted with SOB and tachycardia. Found to be in afib with RVR at his pcp's office, sent to ER for management. Cardiology is consulted to help manage new onset afib with RVR  Started on IV cardizem   WBC 6.1 Hgb 11.4 Plt 167 K 3.8 Cr 1.69 BUN 33 Mg 1.4 BNP 100 TSH 0.74  COVID neg CXR no acute process EKG afib with RVR Echo pending  New diagnosis of afib, presented with afib with RVR. Converted to SR, off dilt gtt now and on oral lopressor 12.5mg  bid, would increase to 25mg  bid. CHADS2Vasc score is 4 , started on eliquis for stroke prevention. FOllow heart rates and bp's on lopressor, his home norvasc, HCTZ, and losartan have been held for now. Possible discharge later this afternoon pending heart rates, bp's, and echo findings   Carlyle Dolly MD

## 2019-02-22 NOTE — Discharge Summary (Signed)
Jack Mejia, is a 73 y.o. male  DOB [birth year]  MRN [id number].  Admission date:  02/21/2019  Admitting Physician  Oswald Hillock, MD  Discharge Date:  02/22/2019   Primary MD  Jack Kirschner, MD  Recommendations for primary care physician for things to follow:  -Please check CBC, BMP, magnesium during next visit  Admission Diagnosis  Atrial fibrillation with rapid ventricular response [facility]) [I48.91]   Discharge Diagnosis  Atrial fibrillation with rapid ventricular response ([place]) [I48.91]    Active Problems:   New onset atrial fibrillation ([place])      Past Medical History:  Diagnosis Date   Arthritis    Cramps of left lower extremity    Depression    Diabetes mellitus    type 2 for 7-8 yrs   Dyspnea    Elevated liver enzymes    Fatty liver    History of kidney stones    Hyperlipidemia    Hypertension    Vertigo     Past Surgical History:  Procedure Laterality Date   CARPAL TUNNEL RELEASE     both wrist   cataract surgery     bilateral   CHOLECYSTECTOMY     EYE SURGERY     HEMORROIDECTOMY     LUMBAR LAMINECTOMY/DECOMPRESSION MICRODISCECTOMY N/A 08/06/2016   Procedure: LUMBAR THREE- LUMBAR FIVE  DECOMPRESSIVE LUMBAR LAMINECTOMY;  Surgeon: Jovita Gamma, MD;  Location: [place];  Service: Neurosurgery;  Laterality: N/A;       History of present illness and  Hospital Course:     Kindly see H&P for history of present illness and admission details, please review complete Labs, Consult reports and Test reports for all details in brief  HPI  from the history and physical done on the day of admission 02/21/2019  Jack Mejia  is a 72 y.o. male, with a history of diabetes mellitus type 2, hyperlipidemia, hypertension, fatty liver, depression came to ED with chief complaint of fatigue and shortness of breath for past couple of months.  Patient says that he feels worse  with exertion.  He recently saw his rheumatologist who recommended he go to the ED due to resting tachycardia.  However patient went to his PCP.  In office EKG showed A. fib with RVR.  Patient was sent to ED for further evaluation. He denies chest pain. Patient had episode of syncope on Thursday, while he was taking the dog out and when he stood up he felt lightheaded and a brief loss of consciousness. Has been feeling fatigued and short of breath on exertion. He does not have a history of stroke or seizures. No history of CAD, no history of CHF In the ED patient was started on IV Cardizem, also found to have low magnesium 1.4.  2 g mag sulfate given in the ED.   Hospital Course   New onset atrial fibrillation - CHA2DS2-VASc Score and unadjusted Ischemic Stroke Rate (% per year) is equal to 4.8 % stroke rate/year from a score of 4 (HTN, DM,  Aortic Calcifications by prior CT, age).  -Initially on heparin GTT, seen by cardiology, transition to Eliquis. -Converted to normal sinus rhythm overnight, he required diltiazem drip, he is currently normal sinus rhythm at time of discharge, he will be discharged on metoprolol 25 mg p.o. twice daily per cardiology recommendation, his home Norvasc, hydrochlorothiazide, and losartan has been held on discharge. -2D echo with a preserved EF and no significant mitral valve disease   Diabetes mellitus type 2-continue with home regimen  Polymyalgia rheumatica-patient is on prednisone 5 mg p.o. twice daily.  We will continue with prednisone.  Recently diagnosed cellulitis-patient is currently on doxycycline, has 1 more day of therapy left.  Hypertension-blood pressure is controlled,-Home regimen has been stopped on discharge as he is started on metoprolol.  Acute kidney injury versus CKD stage III-patient's baseline creatinine is 1.29 as of October 2019, it was 6.9 on admission, creatinine back to baseline at time of discharge  .   Hypomagnesemia -Repleted  Discharge Condition: stable   Follow UP  Follow-up Information    Jack Kirschner, MD Follow up in 1 week(s).   Specialty: Family Medicine Contact information: [address] [place] [address] [phone]             Discharge Instructions  and  Discharge Medications     Discharge Instructions    Discharge instructions   Complete by: As directed    Follow with Primary MD Jack Kirschner, MD in 7 days   Get CBC, CMP,checked  by Primary MD next visit.    Activity: As tolerated with Full fall precautions use walker/cane & assistance as needed   Disposition Home    Diet: Heart Healthy  , with feeding assistance and aspiration precautions.  For Heart failure patients - Check your Weight same time everyday, if you gain over 2 pounds, or you develop in leg swelling, experience more shortness of breath or chest pain, call your Primary MD immediately. Follow Cardiac Low Salt Diet and 1.5 lit/day fluid restriction.   On your next visit with your primary care physician please Get Medicines reviewed and adjusted.   Please request your Prim.MD to go over all Hospital Tests and Procedure/Radiological results at the follow up, please get all Hospital records sent to your Prim MD by signing hospital release before you go home.   If you experience worsening of your admission symptoms, develop shortness of breath, life threatening emergency, suicidal or homicidal thoughts you must seek medical attention immediately by calling 911 or calling your MD immediately  if symptoms less severe.  You Must read complete instructions/literature along with all the possible adverse reactions/side effects for all the Medicines you take and that have been prescribed to you. Take any new Medicines after you have completely understood and accpet all the possible adverse reactions/side effects.   Do not drive, operating heavy machinery, perform  activities at heights, swimming or participation in water activities or provide baby sitting services if your were admitted for syncope or siezures until you have seen by Primary MD or a Neurologist and advised to do so again.  Do not drive when taking Pain medications.    Do not take more than prescribed Pain, Sleep and Anxiety Medications  Special Instructions: If you have smoked or chewed Tobacco  in the last 2 yrs please stop smoking, stop any regular Alcohol  and or any Recreational drug use.  Wear Seat belts while driving.   Please note  You were cared  for by a hospitalist during your hospital stay. If you have any questions about your discharge medications or the care you received while you were in the hospital after you are discharged, you can call the unit and asked to speak with the hospitalist on call if the hospitalist that took care of you is not available. Once you are discharged, your primary care physician will handle any further medical issues. Please note that NO REFILLS for any discharge medications will be authorized once you are discharged, as it is imperative that you return to your primary care physician (or establish a relationship with a primary care physician if you do not have one) for your aftercare needs so that they can reassess your need for medications and monitor your lab values.   Increase activity slowly   Complete by: As directed      Allergies as of 02/22/2019      Reactions   Demerol Nausea And Vomiting   Vasotec [enalapril] Cough      Medication List    STOP taking these medications   amLODipine 5 MG tablet Commonly known as: NORVASC   hydrochlorothiazide 25 MG tablet Commonly known as: HYDRODIURIL   losartan 50 MG tablet Commonly known as: COZAAR     TAKE these medications   apixaban 5 MG Tabs tablet Commonly known as: ELIQUIS Take 1 tablet (5 mg total) by mouth 2 (two) times daily.   doxycycline 100 MG tablet Commonly known as:  VIBRA-TABS Take 1 tablet (100 mg total) by mouth 2 (two) times daily.   glipiZIDE 5 MG tablet Commonly known as: GLUCOTROL TAKE 2 TABLETS BY MOUTH TWICE A DAY What changed: when to take this   metFORMIN 500 MG tablet Commonly known as: GLUCOPHAGE TAKE 2 TABLETS BY MOUTH TWICE A DAY What changed: when to take this   metoprolol tartrate 25 MG tablet Commonly known as: LOPRESSOR Take 1 tablet (25 mg total) by mouth 2 (two) times daily.   OMEGA-3 FISH OIL PO Take 1 capsule by mouth daily.   pantoprazole 40 MG tablet Commonly known as: PROTONIX Take 1 tablet (40 mg total) by mouth daily.   predniSONE 10 MG tablet Commonly known as: DELTASONE Take 1 tablet (10 mg total) by mouth daily with breakfast. What changed:   how much to take  when to take this         Diet and Activity recommendation: See Discharge Instructions above   Consults obtained -  Cardiology   Major procedures and Radiology Reports - PLEASE review detailed and final reports for all details, in brief -   Recent Results (from the past 43800 hour(s))  ECHOCARDIOGRAM COMPLETE   Collection Time: 02/22/19 12:36 PM  Result Value   Weight 3,333.36   Height 70   BP 133/64   Narrative     ECHOCARDIOGRAM REPORT       Patient Name:   HARMON BOMMARITO Date of Exam: 02/22/2019 Medical Rec #:  [id number]       Height:       70.0 in Accession #:    [id number]      Weight:       208.3 lb Date of Birth:  04/10/1946      BSA:          2.12 m Patient Age:    60 years        BP:           133/64 mmHg Patient Gender: M  HR:           81 bpm. Exam Location:  [facility]    Procedure: 2D Echo  Indications:    Atrial Fibrillation 427.31 / I48.91   History:        Patient has no prior history of Echocardiogram examinations.                 Atrial Fibrillation Risk Factors: Hypertension, Diabetes,                 Dyslipidemia and Former Smoker.   Sonographer:    Leavy Cella RDCS  (AE) Referring Phys: [place]    1. The left ventricle has normal systolic function with an ejection fraction of 60-65%. The cavity size was normal. There is moderately increased left ventricular wall thickness. Left ventricular diastolic Doppler parameters are indeterminate.  2. The right ventricle has normal systolic function. The cavity was normal. There is no increase in right ventricular wall thickness.  3. No evidence of mitral valve stenosis.  4. The aortic valve is tricuspid. Mild thickening of the aortic valve. Mild calcification of the aortic valve. No stenosis of the aortic valve. Mild aortic annular calcification noted.  5. The aorta is normal in size and structure.  6. The aortic root is normal in size and structure.  7. Pulmonary hypertension is indeterminate, inadequate TR jet.  FINDINGS  Left Ventricle: The left ventricle has normal systolic function, with an ejection fraction of 60-65%. The cavity size was normal. There is moderately increased left ventricular wall thickness. Left ventricular diastolic Doppler parameters are  indeterminate.  Right Ventricle: The right ventricle has normal systolic function. The cavity was normal. There is no increase in right ventricular wall thickness.  Left Atrium: Left atrial size was normal in size.  Right Atrium: Right atrial size was normal in size. Right atrial pressure is estimated at 10 mmHg.  Interatrial Septum: No atrial level shunt detected by color flow Doppler.  Pericardium: There is no evidence of pericardial effusion.  Mitral Valve: The mitral valve is normal in structure. Mitral valve regurgitation is not visualized by color flow Doppler. No evidence of mitral valve stenosis.  Tricuspid Valve: The tricuspid valve is normal in structure. Tricuspid valve regurgitation is mild by color flow Doppler.  Aortic Valve: The aortic valve is tricuspid Mild thickening of the aortic valve. Mild calcification  of the aortic valve. Aortic valve regurgitation was not visualized by color flow Doppler. There is No stenosis of the aortic valve, with a calculated  valve area of 1.64 cm. Mild aortic annular calcification noted.  Pulmonic Valve: The pulmonic valve was not well visualized. Pulmonic valve regurgitation is not visualized by color flow Doppler. No evidence of pulmonic stenosis.  Aorta: The aortic root is normal in size and structure. The aorta is normal in size and structure.  Pulmonary Artery: Pulmonary hypertension is indeterminate, inadequate TR jet.  Venous: The inferior vena cava is normal in size with greater than 50% respiratory variability.    +--------------+--------++  LEFT VENTRICLE            +----------------+---------++ +--------------+--------++  Diastology                    PLAX 2D                   +----------------+---------++ +--------------+--------++  LV e' lateral:   6.31 cm/s    LVIDd:  4.11 cm    +----------------+---------++ +--------------+--------++  LV E/e' lateral: 14.5         LVIDs:         2.99 cm    +----------------+---------++ +--------------+--------++  LV e' medial:    7.07 cm/s    LV PW:         1.27 cm    +----------------+---------++ +--------------+--------++  LV E/e' medial:  12.9         LV IVS:        1.30 cm    +----------------+---------++ +--------------+--------++  LVOT diam:     2.00 cm    +--------------+--------++  LV SV:         40 ml      +--------------+--------++  LV SV Index:   18.29      +--------------+--------++  LVOT Area:     3.14 cm   +--------------+--------++                            +--------------+--------++  +---------------+----------++  RIGHT VENTRICLE              +---------------+----------++  RV S prime:     12.60 cm/s   +---------------+----------++  TAPSE (M-mode): 2.1 cm       +---------------+----------++  RVSP:           31.5 mmHg     +---------------+----------++  +---------------+-------++-----------++  LEFT ATRIUM              Index         +---------------+-------++-----------++  LA diam:        3.90 cm  1.84 cm/m    +---------------+-------++-----------++  LA Vol (A2C):   71.1 ml  33.48 ml/m   +---------------+-------++-----------++  LA Vol (A4C):   42.0 ml  19.78 ml/m   +---------------+-------++-----------++  LA Biplane Vol: 59.1 ml  27.83 ml/m   +---------------+-------++-----------++ +------------+---------++-----------++  RIGHT ATRIUM            Index         +------------+---------++-----------++  RA Pressure: 3.00 mmHg                +------------+---------++-----------++  RA Area:     16.00 cm                +------------+---------++-----------++  RA Volume:   40.90 ml   19.26 ml/m   +------------+---------++-----------++  +------------------+------------++  AORTIC VALVE                      +------------------+------------++  AV Area (Vmax):    1.57 cm       +------------------+------------++  AV Area (Vmean):   1.39 cm       +------------------+------------++  AV Area (VTI):     1.64 cm       +------------------+------------++  AV Vmax:           188.10 cm/s    +------------------+------------++  AV Vmean:          128.610 cm/s   +------------------+------------++  AV VTI:            0.349 m        +------------------+------------++  AV Peak Grad:      14.2 mmHg      +------------------+------------++  AV Mean Grad:      7.7 mmHg       +------------------+------------++  LVOT Vmax:         94.10 cm/s     +------------------+------------++  LVOT Vmean:        56.850 cm/s    +------------------+------------++  LVOT VTI:          0.182 m        +------------------+------------++  LVOT/AV VTI ratio: 0.52           +------------------+------------++   +-------------+-------++  AORTA                   +-------------+-------++  Ao Root diam: 3.20  cm   +-------------+-------++  +--------------+----------++ +---------------+-----------++  MITRAL VALVE                 TRICUSPID VALVE               +--------------+----------++ +---------------+-----------++  MV Area (PHT): 2.91 cm      TR Peak grad:   28.5 mmHg     +--------------+----------++ +---------------+-----------++  MV PHT:        75.69 msec    TR Vmax:        267.00 cm/s   +--------------+----------++ +---------------+-----------++  MV Decel Time: 261 msec      Estimated RAP:  3.00 mmHg     +--------------+----------++ +---------------+-----------++ +--------------+----------++  RVSP:           31.5 mmHg      MV E velocity: 91.20 cm/s   +---------------+-----------++ +--------------+----------++  MV A velocity: 60.10 cm/s   +--------------+-------+ +--------------+----------++  SHUNTS                   MV E/A ratio:  1.52         +--------------+-------+ +--------------+----------++  Systemic VTI:  0.18 m                                +--------------+-------+                               Systemic Diam: 2.00 cm                               +--------------+-------+    Carlyle Dolly MD Electronically signed by Carlyle Dolly MD Signature Date/Time: 02/22/2019/12:54:30 PM       Final     *Note: Due to a large number of results and/or encounters for the requested time period, some results have not been displayed. A complete set of results can be found in Results Review.     Dg Chest Portable 1 View  Result Date: 02/21/2019 CLINICAL DATA:  Dyspnea. New onset of atrial fibrillation. EXAM: PORTABLE CHEST 1 VIEW COMPARISON:  03/01/2017 FINDINGS: The heart size and mediastinal contours are within normal limits. Both lungs are clear. Arthritic changes of both shoulders. Aortic atherosclerosis. IMPRESSION: 1. No acute cardiopulmonary disease. 2. Aortic atherosclerosis. Electronically Signed   By: Lorriane Shire M.D.   On: 02/21/2019 16:51    Micro Results      Recent Results (from the past 240 hour(s))  SARS Coronavirus 2 (CEPHEID - Performed in [place] hospital lab), Hosp Order     Status: None   Collection Time: 02/21/19  6:03 PM   Specimen: Nasopharyngeal Swab  Result Value Ref Range Status   SARS Coronavirus 2 NEGATIVE NEGATIVE Final    Comment: (NOTE) If result is NEGATIVE SARS-CoV-2 target nucleic acids are NOT DETECTED. The SARS-CoV-2 RNA is generally detectable  in upper and lower  respiratory specimens during the acute phase of infection. The lowest  concentration of SARS-CoV-2 viral copies this assay can detect is 250  copies / mL. A negative result does not preclude SARS-CoV-2 infection  and should not be used as the sole basis for treatment or other  patient management decisions.  A negative result may occur with  improper specimen collection / handling, submission of specimen other  than nasopharyngeal swab, presence of viral mutation(s) within the  areas targeted by this assay, and inadequate number of viral copies  (<250 copies / mL). A negative result must be combined with clinical  observations, patient history, and epidemiological information. If result is POSITIVE SARS-CoV-2 target nucleic acids are DETECTED. The SARS-CoV-2 RNA is generally detectable in upper and lower  respiratory specimens dur ing the acute phase of infection.  Positive  results are indicative of active infection with SARS-CoV-2.  Clinical  correlation with patient history and other diagnostic information is  necessary to determine patient infection status.  Positive results do  not rule out bacterial infection or co-infection with other viruses. If result is PRESUMPTIVE POSTIVE SARS-CoV-2 nucleic acids MAY BE PRESENT.   A presumptive positive result was obtained on the submitted specimen  and confirmed on repeat testing.  While 2019 novel coronavirus  (SARS-CoV-2) nucleic acids may be present in the submitted sample  additional  confirmatory testing may be necessary for epidemiological  and / or clinical management purposes  to differentiate between  SARS-CoV-2 and other Sarbecovirus currently known to infect humans.  If clinically indicated additional testing with an alternate test  methodology [phone]) is advised. The SARS-CoV-2 RNA is generally  detectable in upper and lower respiratory sp ecimens during the acute  phase of infection. The expected result is Negative. Fact Sheet for Patients:  StrictlyIdeas.no Fact Sheet for Healthcare Providers: BankingDealers.co.za This test is not yet approved or cleared by the [place] FDA and has been authorized for detection and/or diagnosis of SARS-CoV-2 by FDA under an Emergency Use Authorization (EUA).  This EUA will remain in effect (meaning this test can be used) for the duration of the COVID-19 declaration under Section 564(b)(1) of the Act, 21 U.S.C. section 360bbb-3(b)(1), unless the authorization is terminated or revoked sooner. Performed at [facility], [address], [place], [place] [address]   MRSA PCR Screening     Status: None   Collection Time: 02/21/19  8:28 PM   Specimen: Nasal Mucosa; Nasopharyngeal  Result Value Ref Range Status   MRSA by PCR NEGATIVE NEGATIVE Final    Comment:        The GeneXpert MRSA Assay (FDA approved for NASAL specimens only), is one component of a comprehensive MRSA colonization surveillance program. It is not intended to diagnose MRSA infection nor to guide or monitor treatment for MRSA infections. Performed at [facility], [address], [place], [place] [address]        Today   Subjective:   Jack Mejia today has no headache,no chest abdominal pain,no new weakness tingling or numbness, feels much better wants to go home today.  Patient converted back to normal sinus rhythm overnight  Objective:   Blood pressure 133/64, pulse 67, temperature 98.1 F  (36.7 C), temperature source Oral, resp. rate 10, height 5\' 10"  (1.778 m), weight 94.5 kg, SpO2 95 %.   Intake/Output Summary (Last 24 hours) at 02/22/2019 1638 Last data filed at 02/22/2019 1505 Gross per 24 hour  Intake 209.58 ml  Output  650 ml  Net -440.42 ml    Exam Awake Alert, Oriented x 3, No new F.N deficits, Normal affect Symmetrical Chest wall movement, Good air movement bilaterally, CTAB RRR,No Gallops,Rubs or new Murmurs, No Parasternal Heave +ve B.Sounds, Abd Soft, Non tender, No organomegaly appriciated, No rebound -guarding or rigidity. No Cyanosis, Clubbing or edema, No new Rash or bruise  Data Review   CBC w Diff:  Lab Results  Component Value Date   WBC 5.6 02/22/2019   HGB 10.3 (L) 02/22/2019   HGB 11.9 (L) 09/07/2018   HCT 31.1 (L) 02/22/2019   HCT 34.2 (L) 09/07/2018   PLT 149 (L) 02/22/2019   PLT 229 09/07/2018   LYMPHOPCT 20 02/21/2019   MONOPCT 18 02/21/2019   EOSPCT 0 02/21/2019   BASOPCT 0 02/21/2019    CMP:  Lab Results  Component Value Date   NA 135 02/22/2019   NA 139 05/04/2018   K 3.6 02/22/2019   CL 104 02/22/2019   CO2 21 (L) 02/22/2019   BUN 30 (H) 02/22/2019   BUN 23 05/04/2018   CREATININE 1.37 (H) 02/22/2019   CREATININE 1.16 02/21/2014   PROT 6.3 (L) 02/22/2019   PROT 7.1 05/04/2018   ALBUMIN 3.4 (L) 02/22/2019   ALBUMIN 4.4 05/04/2018   BILITOT 1.0 02/22/2019   BILITOT 0.6 05/04/2018   ALKPHOS 55 02/22/2019   AST 18 02/22/2019   ALT 22 02/22/2019  .   Total Time in preparing paper work, data evaluation and todays exam - 57 minutes  Phillips Climes M.D on 02/22/2019 at 4:38 PM  [facility]   Office  [phone]

## 2019-02-23 ENCOUNTER — Encounter: Payer: Self-pay | Admitting: Family Medicine

## 2019-03-01 ENCOUNTER — Ambulatory Visit (INDEPENDENT_AMBULATORY_CARE_PROVIDER_SITE_OTHER): Payer: Medicare HMO | Admitting: Family Medicine

## 2019-03-01 ENCOUNTER — Other Ambulatory Visit: Payer: Self-pay

## 2019-03-01 ENCOUNTER — Encounter: Payer: Self-pay | Admitting: Family Medicine

## 2019-03-01 VITALS — BP 136/88 | Temp 97.2°F | Wt 219.8 lb

## 2019-03-01 DIAGNOSIS — I4891 Unspecified atrial fibrillation: Secondary | ICD-10-CM

## 2019-03-01 MED ORDER — METOPROLOL TARTRATE 25 MG PO TABS: ORAL_TABLET | ORAL | 0 refills | Status: DC

## 2019-03-01 NOTE — Progress Notes (Signed)
   Subjective:    Patient ID: Jack Mejia, male    DOB: [birth year], 73 y.o.   MRN: [id number] ,Patient arrives shortness of breath and rapid heart rate  Found to be in atrial fibrillation.  Hospitalist managed the patient. for follow-up from the hospital  Presented with.   HPI Pt here today for hospital follow up. Pt was in hospital on 02/21/2019 for Afib. Pt has questions about Eliquis. Pt states he is still tired and weak.  Pt wondering if blood can be to thin. Pt states he would also like a cheaper brand of Eliquis due to the cost being about $250.00.   Notes very tired and fatigued.  Hospitalist notes show that he returned to normal sinus rhythm before discharge.    Review of Systems No headache, no major weight loss or weight gain, no chest pain no back pain abdominal pain no change in bowel habits complete ROS otherwise negative     Objective:   Physical Exam Alert and oriented, vitals reviewed and stable, NAD ENT-TM's and ext canals WNL bilat via otoscopic exam Soft palate, tonsils and post pharynx WNL via oropharyngeal exam Neck-symmetric, no masses; thyroid nonpalpable and nontender Pulmonary-no tachypnea or accessory muscle use; Clear without wheezes via auscultation Card--no abnrml murmurs, rhythm reg and rate WNL Carotid pulses symmetric, without bruits        Assessment & Plan:  Impression atrial fibrillation.  Intermittent.  Now in normal sinus rhythm.  Patient very frustrated about high cost of Eliquis.  But also very opposed to the idea of coming in frequently for INR checks.  Notes fatigue and tiredness on metoprolol.  We cut the metoprolol dose in half.  I am reluctant to stop it in case he reverts back into atrial fibrillation.  Long discussion held.  Cardiology referral rationale discussed  Greater than 50% of this 25 minute face to face visit was spent in counseling and discussion and coordination of care regarding the above diagnosis/diagnosies

## 2019-03-06 ENCOUNTER — Ambulatory Visit (INDEPENDENT_AMBULATORY_CARE_PROVIDER_SITE_OTHER): Payer: Medicare HMO | Admitting: Otolaryngology

## 2019-03-06 ENCOUNTER — Other Ambulatory Visit: Payer: Self-pay

## 2019-03-06 ENCOUNTER — Telehealth: Payer: Self-pay | Admitting: Family Medicine

## 2019-03-06 DIAGNOSIS — H6123 Impacted cerumen, bilateral: Secondary | ICD-10-CM | POA: Diagnosis not present

## 2019-03-06 DIAGNOSIS — R220 Localized swelling, mass and lump, head: Secondary | ICD-10-CM

## 2019-03-06 DIAGNOSIS — H903 Sensorineural hearing loss, bilateral: Secondary | ICD-10-CM

## 2019-03-06 NOTE — Telephone Encounter (Signed)
Pt is checking status of referral to cardiology. Referral was placed 8/5. If there is anything I can tell the pt please let me know and I will call him back.

## 2019-03-07 NOTE — Telephone Encounter (Signed)
Pt was already scheduled with Cardio - called pt & gave appt info, pt verbalized understanding

## 2019-03-15 ENCOUNTER — Ambulatory Visit (INDEPENDENT_AMBULATORY_CARE_PROVIDER_SITE_OTHER): Payer: Medicare HMO | Admitting: Student

## 2019-03-15 ENCOUNTER — Encounter: Payer: Self-pay | Admitting: Student

## 2019-03-15 ENCOUNTER — Other Ambulatory Visit: Payer: Self-pay

## 2019-03-15 VITALS — BP 176/90 | Temp 98.4°F | Ht 70.0 in | Wt 218.0 lb

## 2019-03-15 DIAGNOSIS — R600 Localized edema: Secondary | ICD-10-CM | POA: Diagnosis not present

## 2019-03-15 DIAGNOSIS — N183 Chronic kidney disease, stage 3 unspecified: Secondary | ICD-10-CM

## 2019-03-15 DIAGNOSIS — Z79899 Other long term (current) drug therapy: Secondary | ICD-10-CM

## 2019-03-15 DIAGNOSIS — I10 Essential (primary) hypertension: Secondary | ICD-10-CM

## 2019-03-15 DIAGNOSIS — I480 Paroxysmal atrial fibrillation: Secondary | ICD-10-CM | POA: Diagnosis not present

## 2019-03-15 DIAGNOSIS — E119 Type 2 diabetes mellitus without complications: Secondary | ICD-10-CM

## 2019-03-15 MED ORDER — HYDROCHLOROTHIAZIDE 25 MG PO TABS: 25.0000 mg | ORAL_TABLET | Freq: Every day | ORAL | 3 refills | Status: DC

## 2019-03-15 MED ORDER — METOPROLOL TARTRATE 25 MG PO TABS: ORAL_TABLET | ORAL | 3 refills | Status: DC

## 2019-03-15 NOTE — Progress Notes (Signed)
Cardiology Office Note    Date:  03/15/2019   ID:  CLYDE ZARRELLA, DOB 03-10-1946, MRN [id number]  PCP:  [name], MD  Cardiologist: [name], MD    Chief Complaint  Patient presents with  . Hospitalization Follow-up    History of Present Illness:    Jack Mejia is a 73 y.o. male with past medical history of HTN, Type 2 DM, PMR, and Stage 3 CKD who presents to the office today for hospital follow-up.   He was most recently admitted to [facility] on 02/21/2019 after being found to be in atrial fibrillation with RVR by his PCP. He reported worsening dyspnea on exertion and fatigue for the past 3-4 months along with noting his HR was in the 140's to 150's but had not informed his PCP. TSH and K+ were WNL. Mg was low and replaced. He initially required IV Cardizem for rate-control and converted back to NSR overnight. IV Cardizem was discontinued and he was switched to Lopressor which was titrated to '25mg'$  BID prior to discharge. Was recommended to follow BP as an outpatient as he had been on Amlodipine, HCTZ, and Losartan prior to admission. Given his CHA2DS2-VASc Score of 4, he was started on Eliquis '5mg'$  BID for anticoagulation.   In talking with the patient today, he reports worsening fatigue and dyspnea on exertion but reports this has been occurring since this past Spring. Lopressor was reduced to 12.5 mg twice daily by his PCP a few weeks ago to see if this would help but he did not experience significant improvement in his symptoms.  He denies any specific palpitations but says that his heart rate has been variable from the 80's to 120's when checked at home.  He has recently been suffering from kidney stones and has been passing them spontaneously. Denies any associated hematuria.   He is concerned about the cost of Eliquis as he was informed this would be $250 next month as he has not yet met his deductible. He has been provided with patient assistance paperwork  and is currently completing this.   Past Medical History:  Diagnosis Date  . Arthritis   . Cramps of left lower extremity   . Depression   . Diabetes mellitus    type 2 for 7-8 yrs  . Dyspnea   . Elevated liver enzymes   . Fatty liver   . History of kidney stones   . Hyperlipidemia   . Hypertension   . Vertigo     Past Surgical History:  Procedure Laterality Date  . CARPAL TUNNEL RELEASE     both wrist  . cataract surgery     bilateral  . CHOLECYSTECTOMY    . EYE SURGERY    . HEMORROIDECTOMY    . LUMBAR LAMINECTOMY/DECOMPRESSION MICRODISCECTOMY N/A 08/06/2016   Procedure: LUMBAR THREE- LUMBAR FIVE  DECOMPRESSIVE LUMBAR LAMINECTOMY;  Surgeon: Jovita Gamma, MD;  Location: [facility];  Service: Neurosurgery;  Laterality: N/A;    Current Medications: Outpatient Medications Prior to Visit  Medication Sig Dispense Refill  . apixaban (ELIQUIS) 5 MG TABS tablet Take 1 tablet (5 mg total) by mouth 2 (two) times daily. 60 tablet 0  . Cyanocobalamin (VITAMIN B 12 PO) Take 2,500 mcg by mouth daily.    [name] doxycycline (VIBRA-TABS) 100 MG tablet Take 1 tablet (100 mg total) by mouth 2 (two) times daily. 20 tablet 0  . glipiZIDE (GLUCOTROL) 5 MG tablet TAKE 2 TABLETS BY MOUTH TWICE A DAY (  Patient taking differently: Take 10 mg by mouth 2 (two) times daily before a meal. ) 360 tablet 1  . metFORMIN (GLUCOPHAGE) 500 MG tablet TAKE 2 TABLETS BY MOUTH TWICE A DAY (Patient taking differently: Take 1,000 mg by mouth 2 (two) times daily with a meal. ) 360 tablet 1  . Omega-3 Fatty Acids (OMEGA-3 FISH OIL PO) Take 1 capsule by mouth daily.     . predniSONE (DELTASONE) 10 MG tablet Take 1 tablet (10 mg total) by mouth daily with breakfast. (Patient taking differently: Take 5 mg by mouth 2 (two) times daily with a meal. ) 30 tablet 0  . metoprolol tartrate (LOPRESSOR) 25 MG tablet Take one half tablet (1/2) by mouth BID 60 tablet 0  . pantoprazole (PROTONIX) 40 MG tablet Take 1 tablet (40 mg total) by  mouth daily. 30 tablet 0   No facility-administered medications prior to visit.      Allergies:   Demerol and Vasotec [enalapril]   Social History   Socioeconomic History  . Marital status: Divorced    Spouse name: Not on file  . Number of children: Not on file  . Years of education: Not on file  . Highest education level: Not on file  Occupational History  . Not on file  Social Needs  . Financial resource strain: Not hard at all  . Food insecurity    Worry: Never true    Inability: Never true  . Transportation needs    Medical: No    Non-medical: No  Tobacco Use  . Smoking status: Former Smoker    Types: Cigarettes    Quit date: 10/14/1982    Years since quitting: 36.4  . Smokeless tobacco: Never Used  . Tobacco comment: 29 yrs ago  Substance and Sexual Activity  . Alcohol use: No  . Drug use: No  . Sexual activity: Never    Birth control/protection: None  Lifestyle  . Physical activity    Days per week: 0 days    Minutes per session: 0 min  . Stress: Not at all  Relationships  . Social connections    Talks on phone: More than three times a week    Gets together: More than three times a week    Attends religious service: Never    Active member of club or organization: No    Attends meetings of clubs or organizations: Never    Relationship status: Divorced  Other Topics Concern  . Not on file  Social History Narrative  . Not on file     Family History:  The patient's family history includes Cancer in his mother; Diabetes in his maternal grandmother, mother, and sister.   Review of Systems:   Please see the history of present illness.     General:  No chills, fever, night sweats or weight changes. Positive for fatigue.  Cardiovascular:  No chest pain,  edema, orthopnea, palpitations, paroxysmal nocturnal dyspnea. Positive for dyspnea on exertion.  Dermatological: No rash, lesions/masses Respiratory: No cough, dyspnea Urologic: No hematuria, dysuria  Abdominal:   No nausea, vomiting, diarrhea, bright red blood per rectum, melena, or hematemesis Neurologic:  No visual changes, wkns, changes in mental status. All other systems reviewed and are otherwise negative except as noted above.   Physical Exam:    VS:  BP (!) 176/90   Temp 98.4 F (36.9 C)   Ht '5\' 10"'$  (1.778 m)   Wt 218 lb (98.9 kg)   BMI 31.28 kg/m  General: Well developed, well nourished,male appearing in no acute distress. Head: Normocephalic, atraumatic, sclera non-icteric, no xanthomas, nares are without discharge.  Neck: No carotid bruits. JVD not elevated.  Lungs: Respirations regular and unlabored, without wheezes or rales.  Heart: Regular rate and rhythm. No S3 or S4.  No murmur, no rubs, or gallops appreciated. Abdomen: Soft, non-tender, non-distended with normoactive bowel sounds. No hepatomegaly. No rebound/guarding. No obvious abdominal masses. Msk:  Strength and tone appear normal for age. No joint deformities or effusions. Extremities: No clubbing or cyanosis. 1+ pitting edema up to knees bilaterally.  Distal pedal pulses are 2+ bilaterally. Neuro: Alert and oriented X 3. Moves all extremities spontaneously. No focal deficits noted. Psych:  Responds to questions appropriately with a normal affect. Skin: No rashes or lesions noted  Wt Readings from Last 3 Encounters:  03/15/19 218 lb (98.9 kg)  03/01/19 219 lb 12.8 oz (99.7 kg)  02/22/19 208 lb 5.4 oz (94.5 kg)     Studies/Labs Reviewed:   EKG:  EKG is not ordered today.    Recent Labs: 02/21/2019: B Natriuretic Peptide 100.0; TSH 0.740 02/22/2019: ALT 22; BUN 30; Creatinine, Ser 1.37; Hemoglobin 10.3; Magnesium 1.7; Platelets 149; Potassium 3.6; Sodium 135   Lipid Panel    Component Value Date/Time   CHOL 173 05/04/2018 1056   TRIG 278 (H) 05/04/2018 1056   HDL 34 (L) 05/04/2018 1056   CHOLHDL 5.1 (H) 05/04/2018 1056   CHOLHDL 4.4 02/21/2014 0703   VLDL 38 02/21/2014 0703   LDLCALC 83  05/04/2018 1056    Additional studies/ records that were reviewed today include:   NST: 03/2017  Blood pressure demonstrated a hypertensive response to exercise.  This is a low risk study. No perfusion imaging suggestive of significant ischemia or scar.  Nuclear stress EF: 52%.  Diffuse nonspecific ST-T abnormalities in inferolateral leads, with less than 1 mm horizontal depressions with stress.  Echocardiogram: 02/22/2019 IMPRESSIONS    1. The left ventricle has normal systolic function with an ejection fraction of 60-65%. The cavity size was normal. There is moderately increased left ventricular wall thickness. Left ventricular diastolic Doppler parameters are indeterminate.  2. The right ventricle has normal systolic function. The cavity was normal. There is no increase in right ventricular wall thickness.  3. No evidence of mitral valve stenosis.  4. The aortic valve is tricuspid. Mild thickening of the aortic valve. Mild calcification of the aortic valve. No stenosis of the aortic valve. Mild aortic annular calcification noted.  5. The aorta is normal in size and structure.  6. The aortic root is normal in size and structure.  7. Pulmonary hypertension is indeterminate, inadequate TR jet.  Assessment:    1. Paroxysmal atrial fibrillation (HCC)   2. Bilateral lower extremity edema   3. Essential hypertension   4. Medication management   5. CKD (chronic kidney disease) stage 3, GFR 30-59 ml/min (HCC)   6. Type 2 diabetes mellitus without complication, without long-term current use of insulin ([place])      Plan:   In order of problems listed above:  1. Paroxysmal Atrial Fibrillation - he is vague in regards to his symptoms as he reports worsening fatigue and dyspnea for the past 6+ months. Unsure if this improved while in NSR but he is in NSR by examination today but HR fluctuates from the 80's to 120's at home. Suspect he might be having paroxysmal episodes.  - Lopressor  was reduced from '25mg'$  BID to 12.'5mg'$  BID to  see if this would help with his fatigue but he did not experience significant improvement in his symptoms. Will titrate to '25mg'$  in AM/12.'5mg'$  in PM. I informed him to try this for several weeks and if no improvement, would consider trying Cardizem CD. - he denies any evidence of active bleeding. Continue Eliquis '5mg'$  BID. Samples provided. He is currently completing patient assistance paperwork. Will try to avoid Coumadin as he does not wish to have frequent INR checks.   2. Lower Extremity Edema - 1+ pitting edema on examination with an associated 6 lb weight gain on his home scales. Will restart PTA HCTZ '25mg'$  daily. Recheck BMET in 2 weeks. Recommended reducing his sodium intake as well to assist with this.   3. HTN - BP is elevated at 176/90 during today's visit. Reports SBP has been variable in the 130's to 170's when checked at home.  - currently on Lopressor 12.'5mg'$  BID. Was on Amlodipine, HCTZ, and Losartan PTA but they were discontinued at discharge. Given his edema, will add back HCTZ at this time. I encouraged him to follow his BP at home.   4. Stage 3 CKD - creatinine peaked at 1.69 during admission in 01/2019, improved to 1.37 at the time of discharge which is close to baseline. Will recheck in 2 weeks following the addition of HCTZ back to his medication regimen.   5. Type 2 DM - Hgb A1c elevated to 9.0 during recent admission. Followed by PCP.    Medication Adjustments/Labs and Tests Ordered: Current medicines are reviewed at length with the patient today.  Concerns regarding medicines are outlined above.  Medication changes, Labs and Tests ordered today are listed in the Patient Instructions below. Patient Instructions  Medication Instructions:  Take Metoprolol 25 mg am and 12.5 mg pm  START HCTZ 25 mg daily  Labwork:  BMET in 2 weeks    Procedures/Testing:  NONE   Follow-Up: 2 months with [place] PA-C, or Dr.  Bronson Ing   Any Additional Special Instructions Will Be Listed Below (If Applicable).   If you need a refill on your cardiac medications before your next appointment, please call your pharmacy.      Signed, Erma Heritage, PA-C  03/15/2019 8:46 PM    [facility] [address] [place], [place] [address] Phone: [phone] Fax: [phone]

## 2019-03-15 NOTE — Patient Instructions (Addendum)
Medication Instructions:  Take Metoprolol 25 mg am and 12.5 mg pm  START HCTZ 25 mg daily  Labwork:  BMET in 2 weeks    Procedures/Testing:  NONE   Follow-Up: 2 months with [place] PA-C, or Dr.Branch   Any Additional Special Instructions Will Be Listed Below (If Applicable).     If you need a refill on your cardiac medications before your next appointment, please call your pharmacy.

## 2019-03-21 DIAGNOSIS — R768 Other specified abnormal immunological findings in serum: Secondary | ICD-10-CM | POA: Diagnosis not present

## 2019-03-21 DIAGNOSIS — Z8261 Family history of arthritis: Secondary | ICD-10-CM | POA: Diagnosis not present

## 2019-03-21 DIAGNOSIS — Z6831 Body mass index (BMI) 31.0-31.9, adult: Secondary | ICD-10-CM | POA: Diagnosis not present

## 2019-03-21 DIAGNOSIS — M7989 Other specified soft tissue disorders: Secondary | ICD-10-CM | POA: Diagnosis not present

## 2019-03-21 DIAGNOSIS — M353 Polymyalgia rheumatica: Secondary | ICD-10-CM | POA: Diagnosis not present

## 2019-03-21 DIAGNOSIS — E669 Obesity, unspecified: Secondary | ICD-10-CM | POA: Diagnosis not present

## 2019-03-21 DIAGNOSIS — I4892 Unspecified atrial flutter: Secondary | ICD-10-CM | POA: Diagnosis not present

## 2019-03-21 DIAGNOSIS — M2550 Pain in unspecified joint: Secondary | ICD-10-CM | POA: Diagnosis not present

## 2019-03-21 DIAGNOSIS — R5383 Other fatigue: Secondary | ICD-10-CM | POA: Diagnosis not present

## 2019-03-21 DIAGNOSIS — I4891 Unspecified atrial fibrillation: Secondary | ICD-10-CM | POA: Diagnosis not present

## 2019-03-22 ENCOUNTER — Telehealth: Payer: Self-pay | Admitting: *Deleted

## 2019-03-22 NOTE — Telephone Encounter (Signed)
Called pt to review BP and HR after recent increase Lopressor. Pt states that he is tired at times. No other complaints at this time. Please advise   8/26 123/78  109 8/25 121/88  125  8/24 166/87  83 8/23 142/81  91  8/22 139/85  116

## 2019-03-22 NOTE — Telephone Encounter (Signed)
Pt notified and voiced understanding 

## 2019-03-22 NOTE — Telephone Encounter (Signed)
    Given recent dose adjustment, would continue current regimen at this time with his variable rates. If fatigue or rates do not improve, would consider the use of Cardizem CD as discussed at his last office visit.   Signed, Erma Heritage, PA-C 03/22/2019, 1:01 PM Pager: [phone]

## 2019-03-30 ENCOUNTER — Telehealth: Payer: Self-pay | Admitting: Student

## 2019-03-30 NOTE — Telephone Encounter (Signed)
Called pt. No answer. Left msg to call back.  

## 2019-03-30 NOTE — Telephone Encounter (Signed)
    Please let the patient know I reviewed his heart rate and blood pressure log. I suspect that he is back in atrial fibrillation given his elevated rates in the low 100's up to 140's at times. He reported having worsening dyspnea and I suspect his rates are the cause of this. Please have him titrate Lopressor to 37.5mg  in AM/25mg  in PM. Continue to follow HR and BP and report back with readings in 1-2 weeks.   Signed, Erma Heritage, PA-C 03/30/2019, 1:02 PM Pager: [phone]

## 2019-03-31 ENCOUNTER — Telehealth: Payer: Self-pay | Admitting: Cardiovascular Disease

## 2019-03-31 MED ORDER — METOPROLOL TARTRATE 25 MG PO TABS: ORAL_TABLET | ORAL | 3 refills | Status: DC

## 2019-03-31 NOTE — Telephone Encounter (Signed)
See message from B Strader PA-C from yesterday.Pt will increase lopressor to 37.5 mg am and 25 mg pm , record BP and HR and call back in 1-2 weeks, pt agrees

## 2019-03-31 NOTE — Telephone Encounter (Signed)
Please give pt a call -- states he's having problems with his heart rate again.

## 2019-04-06 ENCOUNTER — Other Ambulatory Visit: Payer: Self-pay

## 2019-04-06 ENCOUNTER — Telehealth (INDEPENDENT_AMBULATORY_CARE_PROVIDER_SITE_OTHER): Payer: Medicare HMO | Admitting: Cardiovascular Disease

## 2019-04-06 ENCOUNTER — Other Ambulatory Visit (HOSPITAL_COMMUNITY)
Admission: RE | Admit: 2019-04-06 | Discharge: 2019-04-06 | Disposition: A | Discharge status: Home / Self Care | Payer: Medicare HMO | Source: Ambulatory Visit | Attending: Cardiovascular Disease | Admitting: Cardiovascular Disease

## 2019-04-06 ENCOUNTER — Encounter: Payer: Self-pay | Admitting: Cardiovascular Disease

## 2019-04-06 ENCOUNTER — Telehealth: Payer: Self-pay | Admitting: Student

## 2019-04-06 VITALS — BP 124/88 | HR 143

## 2019-04-06 DIAGNOSIS — I480 Paroxysmal atrial fibrillation: Secondary | ICD-10-CM | POA: Diagnosis not present

## 2019-04-06 DIAGNOSIS — Z79899 Other long term (current) drug therapy: Secondary | ICD-10-CM

## 2019-04-06 DIAGNOSIS — I10 Essential (primary) hypertension: Secondary | ICD-10-CM

## 2019-04-06 DIAGNOSIS — N183 Chronic kidney disease, stage 3 unspecified: Secondary | ICD-10-CM

## 2019-04-06 DIAGNOSIS — R600 Localized edema: Secondary | ICD-10-CM | POA: Insufficient documentation

## 2019-04-06 DIAGNOSIS — R0602 Shortness of breath: Secondary | ICD-10-CM

## 2019-04-06 LAB — BASIC METABOLIC PANEL
Anion gap: 10 (ref 5–15)
BUN: 27 mg/dL — ABNORMAL HIGH (ref 8–23)
CO2: 19 mmol/L — ABNORMAL LOW (ref 22–32)
Calcium: 8.6 mg/dL — ABNORMAL LOW (ref 8.9–10.3)
Chloride: 107 mmol/L (ref 98–111)
Creatinine, Ser: 1.56 mg/dL — ABNORMAL HIGH (ref 0.61–1.24)
GFR calc Af Amer: 51 mL/min — ABNORMAL LOW (ref >60)
GFR calc non Af Amer: 44 mL/min — ABNORMAL LOW (ref >60)
Glucose, Bld: 192 mg/dL — ABNORMAL HIGH (ref 70–99)
Potassium: 3.9 mmol/L (ref 3.5–5.1)
Sodium: 136 mmol/L (ref 135–145)

## 2019-04-06 MED ORDER — DILTIAZEM HCL ER COATED BEADS 120 MG PO CP24: 120.0000 mg | ORAL_CAPSULE | Freq: Two times a day (BID) | ORAL | 3 refills | Status: DC

## 2019-04-06 NOTE — Telephone Encounter (Signed)
Patient states he is having issues with elevated HR. Would like to speak with nurse. / tg

## 2019-04-06 NOTE — Telephone Encounter (Signed)
Returned pt call. He states that his heart rates are still staying even with the increase dose of metoprolol (37.5 in AM , 25 in PM) 113, 162, 154 , 159, 162, 143 and his bp readings are: 126/90, 148/92, 127/91, 1256/86, 126/93, 124/88 . He stated he is feeling very tired and has no energy to do anything. He is starting to have worsening SOB as well. He is not having any CP. Please advise.

## 2019-04-06 NOTE — Telephone Encounter (Signed)
Switch to Cardizem CD 120 mg bid. Then schedule fu visit with [place].

## 2019-04-06 NOTE — Telephone Encounter (Signed)
Pt will have Virtual visit with Dr.Koneswaran today

## 2019-04-06 NOTE — Patient Instructions (Signed)
Medication Instructions: STOP Lopressor   START Cardizem CD 120 mg twice a day  Labwork: Bmet today or tomorrow  Procedures/Testing: None  Follow-Up: 3 weeks with [place] PA-C, either office visit or phone visit  Any Additional Special Instructions Will Be Listed Below (If Applicable).     If you need a refill on your cardiac medications before your next appointment, please call your pharmacy.     Thank you for choosing [facility] !

## 2019-04-06 NOTE — Addendum Note (Signed)
Addended by: Barbarann Ehlers A on: 04/06/2019 11:47 AM   Modules accepted: Orders

## 2019-04-06 NOTE — Progress Notes (Signed)
Virtual Visit via Telephone Note   This visit type was conducted due to national recommendations for restrictions regarding the COVID-19 Pandemic (e.g. social distancing) in an effort to limit this patient's exposure and mitigate transmission in our community.  Due to his co-morbid illnesses, this patient is at least at moderate risk for complications without adequate follow up.  This format is felt to be most appropriate for this patient at this time.  The patient did not have access to video technology/had technical difficulties with video requiring transitioning to audio format only (telephone).  All issues noted in this document were discussed and addressed.  No physical exam could be performed with this format.  Please refer to the patient's chart for his  consent to telehealth for [facility].   Date:  04/06/2019   ID:  Jack Mejia, DOB [birth year], MRN [id number]  Patient Location: Home Provider Location: Home  PCP:  [name], MD  Cardiologist:  [name], MD  Electrophysiologist:  None   Evaluation Performed:  Follow-Up Visit  Chief Complaint: Paroxysmal atrial fibrillation  History of Present Illness:    Jack Mejia is a 73 y.o. male with paroxysmal atrial fibrillation, bilateral lower extremity edema, hypertension, and chronic kidney disease stage III.  He called our office today stating that his heart rates have been elevated in spite of a recent increase of Lopressor to 37.5 mg every morning and 25 mg every evening.  Heart rates have ranged from 113 up to 162 bpm.  Blood pressures have been in the 120-140/80-90 range.  He feels fatigued and is having some worsening shortness of breath.  This dose increase was done on 03/30/2019 by B. Strader PA-C.  He is going on vacation to the [facility] next Tuesday through Friday.  He denies chest pain and leg swelling.  The patient does not have symptoms concerning for COVID-19 infection (fever,  chills, cough).    Past Medical History:  Diagnosis Date  . Arthritis   . Cramps of left lower extremity   . Depression   . Diabetes mellitus    type 2 for 7-8 yrs  . Dyspnea   . Elevated liver enzymes   . Fatty liver   . History of kidney stones   . Hyperlipidemia   . Hypertension   . Vertigo    Past Surgical History:  Procedure Laterality Date  . CARPAL TUNNEL RELEASE     both wrist  . cataract surgery     bilateral  . CHOLECYSTECTOMY    . EYE SURGERY    . HEMORROIDECTOMY    . LUMBAR LAMINECTOMY/DECOMPRESSION MICRODISCECTOMY N/A 08/06/2016   Procedure: LUMBAR THREE- LUMBAR FIVE  DECOMPRESSIVE LUMBAR LAMINECTOMY;  Surgeon: Jovita Gamma, MD;  Location: [place];  Service: Neurosurgery;  Laterality: N/A;     Current Meds  Medication Sig  . apixaban (ELIQUIS) 5 MG TABS tablet Take 1 tablet (5 mg total) by mouth 2 (two) times daily.  . Cyanocobalamin (VITAMIN B 12 PO) Take 2,500 mcg by mouth daily.  [name] doxycycline (VIBRA-TABS) 100 MG tablet Take 1 tablet (100 mg total) by mouth 2 (two) times daily.  [name] glipiZIDE (GLUCOTROL) 5 MG tablet TAKE 2 TABLETS BY MOUTH TWICE A DAY (Patient taking differently: Take 10 mg by mouth 2 (two) times daily before a meal. )  . hydrochlorothiazide (HYDRODIURIL) 25 MG tablet Take 1 tablet (25 mg total) by mouth daily.  . metFORMIN (GLUCOPHAGE) 500 MG tablet TAKE 2 TABLETS BY MOUTH TWICE  A DAY (Patient taking differently: Take 1,000 mg by mouth 2 (two) times daily with a meal. )  . metoprolol tartrate (LOPRESSOR) 25 MG tablet Take 37.5 mg am ( 1 /2 tablets) and take 25 mg pm (1 tablet)  . Omega-3 Fatty Acids (OMEGA-3 FISH OIL PO) Take 1 capsule by mouth daily.   . predniSONE (DELTASONE) 10 MG tablet Take 1 tablet (10 mg total) by mouth daily with breakfast. (Patient taking differently: Take 5 mg by mouth 2 (two) times daily with a meal. )     Allergies:   Demerol and Vasotec [enalapril]   Social History   Tobacco Use  . Smoking status: Former  Smoker    Types: Cigarettes    Quit date: 10/14/1982    Years since quitting: 36.5  . Smokeless tobacco: Never Used  . Tobacco comment: 29 yrs ago  Substance Use Topics  . Alcohol use: No  . Drug use: No     Family Hx: The patient's family history includes Cancer in his mother; Diabetes in his maternal grandmother, mother, and sister.  ROS:   Please see the history of present illness.     All other systems reviewed and are negative.   Prior CV studies:   The following studies were reviewed today:  NST: 03/2017  Blood pressure demonstrated a hypertensive response to exercise.  This is a low risk study. No perfusion imaging suggestive of significant ischemia or scar.  Nuclear stress EF: 52%.  Diffuse nonspecific ST-T abnormalities in inferolateral leads, with less than 1 mm horizontal depressions with stress.  Echocardiogram: 02/22/2019 IMPRESSIONS   1. The left ventricle has normal systolic function with an ejection fraction of 60-65%. The cavity size was normal. There is moderately increased left ventricular wall thickness. Left ventricular diastolic Doppler parameters are indeterminate. 2. The right ventricle has normal systolic function. The cavity was normal. There is no increase in right ventricular wall thickness. 3. No evidence of mitral valve stenosis. 4. The aortic valve is tricuspid. Mild thickening of the aortic valve. Mild calcification of the aortic valve. No stenosis of the aortic valve. Mild aortic annular calcification noted. 5. The aorta is normal in size and structure. 6. The aortic root is normal in size and structure. 7. Pulmonary hypertension is indeterminate, inadequate TR jet.  Labs/Other Tests and Data Reviewed:    EKG:  No ECG reviewed.  Recent Labs: 02/21/2019: B Natriuretic Peptide 100.0; TSH 0.740 02/22/2019: ALT 22; BUN 30; Creatinine, Ser 1.37; Hemoglobin 10.3; Magnesium 1.7; Platelets 149; Potassium 3.6; Sodium 135   Recent  Lipid Panel Lab Results  Component Value Date/Time   CHOL 173 05/04/2018 10:56 AM   TRIG 278 (H) 05/04/2018 10:56 AM   HDL 34 (L) 05/04/2018 10:56 AM   CHOLHDL 5.1 (H) 05/04/2018 10:56 AM   CHOLHDL 4.4 02/21/2014 07:03 AM   LDLCALC 83 05/04/2018 10:56 AM    Wt Readings from Last 3 Encounters:  03/15/19 218 lb (98.9 kg)  03/01/19 219 lb 12.8 oz (99.7 kg)  02/22/19 208 lb 5.4 oz (94.5 kg)     Objective:    Vital Signs:  BP 124/88   Pulse (!) 143    VITAL SIGNS:  reviewed  ASSESSMENT & PLAN:    1.  Paroxysmal atrial fibrillation: He is having elevated heart rates with worsening shortness of breath likely precipitated by recurrence of atrial fibrillation.  I will stop Lopressor and start Cardizem CD 120 mg twice daily.  Continue Eliquis 5 mg twice daily. I  will have a nurse call him next week to see how he is doing  2.  Lower extremity edema: He has been restarted on hydrochlorothiazide 25 mg daily. Leg swelling has improved. He needs to reduce sodium intake.  3.  Hypertension: Blood pressure is normal.  I will monitor given switch to Cardizem CD from Lopressor.  4.  Stage III chronic kidney disease: Creatinine peaked at 1.69 during admission in July 2020 and improved to 1.37 at the time of discharge which is close to his baseline.  I will repeat a basic metabolic panel.   COVID-19 Education: The signs and symptoms of COVID-19 were discussed with the patient and how to seek care for testing (follow up with PCP or arrange E-visit).  The importance of social distancing was discussed today.  Time:   Today, I have spent 25 minutes with the patient with telehealth technology discussing the above problems.     Medication Adjustments/Labs and Tests Ordered: Current medicines are reviewed at length with the patient today.  Concerns regarding medicines are outlined above.   Tests Ordered: No orders of the defined types were placed in this encounter.   Medication Changes: No  orders of the defined types were placed in this encounter.   Follow Up:  Virtual Visit or In Person in 3 week(s)  Signed, [name], MD  04/06/2019 11:22 AM    [place]

## 2019-04-15 ENCOUNTER — Encounter (HOSPITAL_COMMUNITY): Payer: Self-pay | Admitting: *Deleted

## 2019-04-15 ENCOUNTER — Emergency Department (HOSPITAL_COMMUNITY): Payer: Medicare HMO

## 2019-04-15 ENCOUNTER — Other Ambulatory Visit: Payer: Self-pay

## 2019-04-15 ENCOUNTER — Inpatient Hospital Stay (HOSPITAL_COMMUNITY)
Admission: EM | Admit: 2019-04-15 | Discharge: 2019-04-21 | DRG: 291 | Disposition: A | Discharge status: Home / Self Care | Payer: Medicare HMO | Attending: Cardiology | Admitting: Cardiology

## 2019-04-15 DIAGNOSIS — N170 Acute kidney failure with tubular necrosis: Secondary | ICD-10-CM | POA: Diagnosis not present

## 2019-04-15 DIAGNOSIS — R0602 Shortness of breath: Secondary | ICD-10-CM

## 2019-04-15 DIAGNOSIS — T501X5A Adverse effect of loop [high-ceiling] diuretics, initial encounter: Secondary | ICD-10-CM | POA: Diagnosis not present

## 2019-04-15 DIAGNOSIS — Z833 Family history of diabetes mellitus: Secondary | ICD-10-CM

## 2019-04-15 DIAGNOSIS — I248 Other forms of acute ischemic heart disease: Secondary | ICD-10-CM | POA: Diagnosis not present

## 2019-04-15 DIAGNOSIS — I5033 Acute on chronic diastolic (congestive) heart failure: Secondary | ICD-10-CM | POA: Diagnosis not present

## 2019-04-15 DIAGNOSIS — Z79899 Other long term (current) drug therapy: Secondary | ICD-10-CM | POA: Diagnosis not present

## 2019-04-15 DIAGNOSIS — Z885 Allergy status to narcotic agent status: Secondary | ICD-10-CM

## 2019-04-15 DIAGNOSIS — E1165 Type 2 diabetes mellitus with hyperglycemia: Secondary | ICD-10-CM | POA: Diagnosis not present

## 2019-04-15 DIAGNOSIS — R079 Chest pain, unspecified: Secondary | ICD-10-CM | POA: Diagnosis not present

## 2019-04-15 DIAGNOSIS — E876 Hypokalemia: Secondary | ICD-10-CM | POA: Diagnosis not present

## 2019-04-15 DIAGNOSIS — I472 Ventricular tachycardia: Secondary | ICD-10-CM | POA: Diagnosis not present

## 2019-04-15 DIAGNOSIS — E785 Hyperlipidemia, unspecified: Secondary | ICD-10-CM | POA: Diagnosis present

## 2019-04-15 DIAGNOSIS — Z87891 Personal history of nicotine dependence: Secondary | ICD-10-CM

## 2019-04-15 DIAGNOSIS — I130 Hypertensive heart and chronic kidney disease with heart failure and stage 1 through stage 4 chronic kidney disease, or unspecified chronic kidney disease: Principal | ICD-10-CM | POA: Diagnosis present

## 2019-04-15 DIAGNOSIS — Z888 Allergy status to other drugs, medicaments and biological substances status: Secondary | ICD-10-CM

## 2019-04-15 DIAGNOSIS — I110 Hypertensive heart disease with heart failure: Secondary | ICD-10-CM | POA: Diagnosis not present

## 2019-04-15 DIAGNOSIS — E1121 Type 2 diabetes mellitus with diabetic nephropathy: Secondary | ICD-10-CM

## 2019-04-15 DIAGNOSIS — N183 Chronic kidney disease, stage 3 unspecified: Secondary | ICD-10-CM

## 2019-04-15 DIAGNOSIS — Z7984 Long term (current) use of oral hypoglycemic drugs: Secondary | ICD-10-CM | POA: Diagnosis not present

## 2019-04-15 DIAGNOSIS — I4891 Unspecified atrial fibrillation: Secondary | ICD-10-CM | POA: Diagnosis not present

## 2019-04-15 DIAGNOSIS — Z87442 Personal history of urinary calculi: Secondary | ICD-10-CM

## 2019-04-15 DIAGNOSIS — N179 Acute kidney failure, unspecified: Secondary | ICD-10-CM

## 2019-04-15 DIAGNOSIS — R0609 Other forms of dyspnea: Secondary | ICD-10-CM

## 2019-04-15 DIAGNOSIS — N281 Cyst of kidney, acquired: Secondary | ICD-10-CM | POA: Diagnosis not present

## 2019-04-15 DIAGNOSIS — R05 Cough: Secondary | ICD-10-CM | POA: Diagnosis not present

## 2019-04-15 DIAGNOSIS — I5043 Acute on chronic combined systolic (congestive) and diastolic (congestive) heart failure: Secondary | ICD-10-CM | POA: Diagnosis not present

## 2019-04-15 DIAGNOSIS — E1122 Type 2 diabetes mellitus with diabetic chronic kidney disease: Secondary | ICD-10-CM | POA: Diagnosis present

## 2019-04-15 DIAGNOSIS — R0600 Dyspnea, unspecified: Secondary | ICD-10-CM

## 2019-04-15 DIAGNOSIS — E119 Type 2 diabetes mellitus without complications: Secondary | ICD-10-CM

## 2019-04-15 DIAGNOSIS — Z20828 Contact with and (suspected) exposure to other viral communicable diseases: Secondary | ICD-10-CM | POA: Diagnosis present

## 2019-04-15 DIAGNOSIS — I483 Typical atrial flutter: Secondary | ICD-10-CM | POA: Diagnosis not present

## 2019-04-15 DIAGNOSIS — I4819 Other persistent atrial fibrillation: Secondary | ICD-10-CM | POA: Diagnosis present

## 2019-04-15 DIAGNOSIS — I10 Essential (primary) hypertension: Secondary | ICD-10-CM | POA: Diagnosis present

## 2019-04-15 DIAGNOSIS — I4892 Unspecified atrial flutter: Secondary | ICD-10-CM | POA: Diagnosis not present

## 2019-04-15 DIAGNOSIS — Z7901 Long term (current) use of anticoagulants: Secondary | ICD-10-CM

## 2019-04-15 DIAGNOSIS — I480 Paroxysmal atrial fibrillation: Secondary | ICD-10-CM | POA: Diagnosis not present

## 2019-04-15 DIAGNOSIS — I5032 Chronic diastolic (congestive) heart failure: Secondary | ICD-10-CM | POA: Diagnosis not present

## 2019-04-15 DIAGNOSIS — J9811 Atelectasis: Secondary | ICD-10-CM | POA: Diagnosis present

## 2019-04-15 DIAGNOSIS — R059 Cough, unspecified: Secondary | ICD-10-CM

## 2019-04-15 DIAGNOSIS — I509 Heart failure, unspecified: Secondary | ICD-10-CM

## 2019-04-15 DIAGNOSIS — N189 Chronic kidney disease, unspecified: Secondary | ICD-10-CM

## 2019-04-15 LAB — BASIC METABOLIC PANEL
Anion gap: 13 (ref 5–15)
BUN: 12 mg/dL (ref 8–23)
CO2: 22 mmol/L (ref 22–32)
Calcium: 9.3 mg/dL (ref 8.9–10.3)
Chloride: 103 mmol/L (ref 98–111)
Creatinine, Ser: 1.24 mg/dL (ref 0.61–1.24)
GFR calc Af Amer: >60 mL/min (ref >60)
GFR calc non Af Amer: 58 mL/min — ABNORMAL LOW (ref >60)
Glucose, Bld: 154 mg/dL — ABNORMAL HIGH (ref 70–99)
Potassium: 3.3 mmol/L — ABNORMAL LOW (ref 3.5–5.1)
Sodium: 138 mmol/L (ref 135–145)

## 2019-04-15 LAB — CBC
HCT: 36.3 % — ABNORMAL LOW (ref 39.0–52.0)
Hemoglobin: 11.9 g/dL — ABNORMAL LOW (ref 13.0–17.0)
MCH: 35.5 pg — ABNORMAL HIGH (ref 26.0–34.0)
MCHC: 32.8 g/dL (ref 30.0–36.0)
MCV: 108.4 fL — ABNORMAL HIGH (ref 80.0–100.0)
Platelets: 195 10*3/uL (ref 150–400)
RBC: 3.35 MIL/uL — ABNORMAL LOW (ref 4.22–5.81)
RDW: 14.8 % (ref 11.5–15.5)
WBC: 5.5 10*3/uL (ref 4.0–10.5)
nRBC: 0 % (ref 0.0–0.2)

## 2019-04-15 LAB — SARS CORONAVIRUS 2 BY RT PCR (HOSPITAL ORDER, PERFORMED IN ~~LOC~~ HOSPITAL LAB): SARS Coronavirus 2: NEGATIVE

## 2019-04-15 LAB — TROPONIN I (HIGH SENSITIVITY)
Troponin I (High Sensitivity): 26 ng/L — ABNORMAL HIGH (ref <18)
Troponin I (High Sensitivity): 32 ng/L — ABNORMAL HIGH (ref <18)

## 2019-04-15 LAB — CBG MONITORING, ED: Glucose-Capillary: 220 mg/dL — ABNORMAL HIGH (ref 70–99)

## 2019-04-15 LAB — BRAIN NATRIURETIC PEPTIDE: B Natriuretic Peptide: 236.1 pg/mL — ABNORMAL HIGH (ref 0.0–100.0)

## 2019-04-15 MED ORDER — INSULIN ASPART 100 UNIT/ML ~~LOC~~ SOLN
0.0000 [IU] | Freq: Three times a day (TID) | SUBCUTANEOUS | Status: DC
  Administered 2019-04-16 (×2): 5 [IU] via SUBCUTANEOUS

## 2019-04-15 MED ORDER — FUROSEMIDE 10 MG/ML IJ SOLN
40.0000 mg | Freq: Once | INTRAMUSCULAR | Status: AC
  Administered 2019-04-15: 15:00:00 40 mg via INTRAVENOUS
  Filled 2019-04-15: qty 4

## 2019-04-15 MED ORDER — DILTIAZEM LOAD VIA INFUSION
20.0000 mg | Freq: Once | INTRAVENOUS | Status: AC
  Administered 2019-04-15: 20 mg via INTRAVENOUS
  Filled 2019-04-15: qty 20

## 2019-04-15 MED ORDER — FUROSEMIDE 10 MG/ML IJ SOLN
40.0000 mg | Freq: Two times a day (BID) | INTRAMUSCULAR | Status: DC
  Administered 2019-04-16 – 2019-04-17 (×3): 40 mg via INTRAVENOUS
  Filled 2019-04-15 (×3): qty 4

## 2019-04-15 MED ORDER — APIXABAN 5 MG PO TABS
5.0000 mg | ORAL_TABLET | Freq: Two times a day (BID) | ORAL | Status: DC
  Administered 2019-04-15 – 2019-04-21 (×12): 5 mg via ORAL
  Filled 2019-04-15 (×12): qty 1

## 2019-04-15 MED ORDER — DILTIAZEM HCL-DEXTROSE 100-5 MG/100ML-% IV SOLN (PREMIX)
5.0000 mg/h | INTRAVENOUS | Status: DC
  Administered 2019-04-15: 15 mg/h via INTRAVENOUS
  Administered 2019-04-15: 5 mg/h via INTRAVENOUS
  Administered 2019-04-16: 15 mg/h via INTRAVENOUS
  Filled 2019-04-15 (×4): qty 100

## 2019-04-15 MED ORDER — INSULIN ASPART 100 UNIT/ML ~~LOC~~ SOLN
4.0000 [IU] | Freq: Once | SUBCUTANEOUS | Status: AC
  Administered 2019-04-15: 4 [IU] via SUBCUTANEOUS

## 2019-04-15 MED ORDER — ACETAMINOPHEN 325 MG PO TABS
650.0000 mg | ORAL_TABLET | ORAL | Status: DC | PRN
  Administered 2019-04-21: 650 mg via ORAL
  Filled 2019-04-15: qty 2

## 2019-04-15 MED ORDER — ONDANSETRON HCL 4 MG/2ML IJ SOLN: 4.0000 mg | Freq: Four times a day (QID) | INTRAMUSCULAR | Status: DC | PRN

## 2019-04-15 MED ORDER — POTASSIUM CHLORIDE CRYS ER 20 MEQ PO TBCR
40.0000 meq | EXTENDED_RELEASE_TABLET | Freq: Once | ORAL | Status: AC
  Administered 2019-04-15: 40 meq via ORAL
  Filled 2019-04-15: qty 2

## 2019-04-15 MED ORDER — SODIUM CHLORIDE 0.9% FLUSH
3.0000 mL | Freq: Once | INTRAVENOUS | Status: AC
  Administered 2019-04-15: 3 mL via INTRAVENOUS

## 2019-04-15 NOTE — ED Triage Notes (Addendum)
Pt with chest pain, cough, sob when supine and tachycardia x 5 weeks.  Came today b/c sob, pain and LE edema is getting worse.  Hr 157 in triage.

## 2019-04-15 NOTE — ED Provider Notes (Signed)
[place] EMERGENCY DEPARTMENT Provider Note   CSN: [id number] Arrival date & time: 04/15/19  1257     History   Chief Complaint Chief Complaint  Patient presents with  . Chest Pain    HPI Jack Mejia is a 73 y.o. male with history of paroxysmal A. fib, hyperlipidemia, hypertension, diabetes mellitus presenting for evaluation of ongoing and progressively worsening palpitations, generalized weakness, shortness of breath, and peripheral edema for at least 10 days.  He called his cardiologist on 04/06/2019 and was seen virtually for this complaint noting that his heart rates have ranged from 113 up to 162 bpm and his blood pressures were fairly well controlled.  He felt fatigued and was having some worsening shortness of breath and lower extremity edema at the time.  His Lopressor was stopped and he was started on Cardizem 120 mg twice daily and was restarted on HCTZ 25 mg daily.  He tells me that since then despite medication changes he has had persistent symptoms.  He notes significant dyspnea on exertion and reports that he can "barely walk" due to his shortness of breath.  He notes chest tightness in the substernal region.  Also notes orthopnea.  States that his bilateral lower extremity edema worsened today so he came for evaluation.  Denies fever, productive cough, abdominal pain, nausea, or vomiting.  Ports that he has been compliant with his Eliquis.     The history is provided by the patient.    Past Medical History:  Diagnosis Date  . Arthritis   . Cramps of left lower extremity   . Depression   . Diabetes mellitus    type 2 for 7-8 yrs  . Dyspnea   . Elevated liver enzymes   . Fatty liver   . History of kidney stones   . Hyperlipidemia   . Hypertension   . Vertigo     Patient Active Problem List   Diagnosis Date Noted  . Acute on chronic diastolic (congestive) heart failure ([place]) 04/15/2019  . New onset atrial fibrillation ([place]) 02/21/2019  .  Depression, major, single episode, mild ([place]) 03/01/2017  . Lumbar stenosis with neurogenic claudication 08/06/2016  . Erectile dysfunction 12/05/2012  . Sensory neuropathy 12/05/2012  . Hyperlipidemia LDL goal <100 12/05/2012  . Rectal bleeding 10/13/2012  . Hypertension 07/07/2011  . Diabetes mellitus ([place]) 07/07/2011  . Fatty liver 07/07/2011    Past Surgical History:  Procedure Laterality Date  . CARPAL TUNNEL RELEASE     both wrist  . cataract surgery     bilateral  . CHOLECYSTECTOMY    . EYE SURGERY    . HEMORROIDECTOMY    . LUMBAR LAMINECTOMY/DECOMPRESSION MICRODISCECTOMY N/A 08/06/2016   Procedure: LUMBAR THREE- LUMBAR FIVE  DECOMPRESSIVE LUMBAR LAMINECTOMY;  Surgeon: Jovita Gamma, MD;  Location: [place];  Service: Neurosurgery;  Laterality: N/A;        Home Medications    Prior to Admission medications   Medication Sig Start Date End Date Taking? Authorizing Provider  apixaban (ELIQUIS) 5 MG TABS tablet Take 1 tablet (5 mg total) by mouth 2 (two) times daily. 02/22/19  Yes Elgergawy, Silver Huguenin, MD  Cyanocobalamin (VITAMIN B 12 PO) Take 2,500 mcg by mouth daily.   Yes [provider]  diltiazem (CARDIZEM CD) 120 MG 24 hr capsule Take 1 capsule (120 mg total) by mouth 2 (two) times daily. 04/06/19  Yes Herminio Commons, MD  glipiZIDE (GLUCOTROL) 5 MG tablet TAKE 2 TABLETS BY MOUTH TWICE  A DAY Patient taking differently: Take 10 mg by mouth 2 (two) times daily before a meal.  11/01/18  Yes [name], MD  hydrochlorothiazide (HYDRODIURIL) 25 MG tablet Take 1 tablet (25 mg total) by mouth daily. 03/15/19 06/13/19 Yes Strader, Fransisco Hertz, PA-C  metFORMIN (GLUCOPHAGE) 500 MG tablet TAKE 2 TABLETS BY MOUTH TWICE A DAY Patient taking differently: Take 1,000 mg by mouth 2 (two) times daily with a meal.  11/01/18  Yes [name], MD  Omega-3 Fatty Acids (OMEGA-3 FISH OIL PO) Take 1 capsule by mouth daily.    Yes [provider]    Family History  Family History  Problem Relation Age of Onset  . Diabetes Mother   . Cancer Mother   . Diabetes Sister   . Diabetes Maternal Grandmother     Social History Social History   Tobacco Use  . Smoking status: Former Smoker    Types: Cigarettes    Quit date: 10/14/1982    Years since quitting: 36.5  . Smokeless tobacco: Never Used  . Tobacco comment: 29 yrs ago  Substance Use Topics  . Alcohol use: No  . Drug use: No     Allergies   Demerol and Vasotec [enalapril]   Review of Systems Review of Systems  Constitutional: Negative for chills and fever.  Respiratory: Positive for chest tightness and shortness of breath.   Cardiovascular: Positive for chest pain, palpitations and leg swelling.  Gastrointestinal: Negative for abdominal pain, nausea and vomiting.  Neurological: Positive for light-headedness. Negative for syncope.  All other systems reviewed and are negative.    Physical Exam Updated Vital Signs BP (!) 168/75   Pulse (!) 56   Temp 97.9 F (36.6 C)   Resp (!) 28   Ht 5\' 10"  (1.778 m)   Wt 98 kg   SpO2 96%   BMI 30.99 kg/m   Physical Exam Vitals signs and nursing note reviewed.  Constitutional:      General: He is not in acute distress.    Appearance: He is well-developed.  HENT:     Head: Normocephalic and atraumatic.  Eyes:     General:        Right eye: No discharge.        Left eye: No discharge.     Conjunctiva/sclera: Conjunctivae normal.  Neck:     Musculoskeletal: Normal range of motion and neck supple.     Vascular: No JVD.     Trachea: No tracheal deviation.  Cardiovascular:     Rate and Rhythm: Tachycardia present. Rhythm irregular.     Pulses:          Radial pulses are 2+ on the right side and 2+ on the left side.       Dorsalis pedis pulses are 1+ on the right side and 1+ on the left side.       Posterior tibial pulses are 1+ on the right side and 2+ on the left side.     Comments: 2+ pitting edema of the bilateral lower  extremities.  Pulmonary:     Effort: Tachypnea present.     Comments: Speaking in short phrases, bibasilar crackles noted.  Chest:     Chest wall: No tenderness.  Abdominal:     General: Bowel sounds are normal. There is no distension.     Palpations: Abdomen is soft.     Tenderness: There is no abdominal tenderness.  Musculoskeletal:     Right lower leg: He  exhibits no tenderness. Edema present.     Left lower leg: He exhibits no tenderness. Edema present.  Skin:    General: Skin is warm and dry.     Findings: No erythema.  Neurological:     Mental Status: He is alert.  Psychiatric:        Behavior: Behavior normal.      ED Treatments / Results  Labs (all labs ordered are listed, but only abnormal results are displayed) Labs Reviewed  BASIC METABOLIC PANEL - Abnormal; Notable for the following components:      Result Value   Potassium 3.3 (*)    Glucose, Bld 154 (*)    GFR calc non Af Amer 58 (*)    All other components within normal limits  CBC - Abnormal; Notable for the following components:   RBC 3.35 (*)    Hemoglobin 11.9 (*)    HCT 36.3 (*)    MCV 108.4 (*)    MCH 35.5 (*)    All other components within normal limits  BRAIN NATRIURETIC PEPTIDE - Abnormal; Notable for the following components:   B Natriuretic Peptide 236.1 (*)    All other components within normal limits  TROPONIN I (HIGH SENSITIVITY) - Abnormal; Notable for the following components:   Troponin I (High Sensitivity) 26 (*)    All other components within normal limits  TROPONIN I (HIGH SENSITIVITY) - Abnormal; Notable for the following components:   Troponin I (High Sensitivity) 32 (*)    All other components within normal limits  SARS CORONAVIRUS 2 (HOSPITAL ORDER, [facility])  BASIC METABOLIC PANEL  CBC    EKG EKG Interpretation  Date/Time:  Saturday April 15 2019 13:35:46 EDT Ventricular Rate:  155 PR Interval:    QRS Duration: 151 QT Interval:  343  QTC Calculation: 551 R Axis:   -41 Text Interpretation:  Atrial fibrillation with rapid ventricular response Non-specific ST-t changes Confirmed by Lajean Saver [phone]) on 04/15/2019 4:30:04 PM   Radiology Dg Chest Portable 1 View  Result Date: 04/15/2019 CLINICAL DATA:  Chest pain, shortness of breath EXAM: PORTABLE CHEST 1 VIEW COMPARISON:  02/21/2019 FINDINGS: Stable cardiomediastinal contours. Small right pleural effusion with associated basilar atelectasis. No pneumothorax. Advanced degenerative changes of the shoulders. IMPRESSION: Small right pleural effusion with associated right basilar atelectasis. Electronically Signed   By: Davina Poke M.D.   On: 04/15/2019 13:47    Procedures .Critical Care Performed by: Renita Papa, PA-C Authorized by: Renita Papa, PA-C   Critical care provider statement:    Critical care time (minutes):  40   Critical care was necessary to treat or prevent imminent or life-threatening deterioration of the following conditions:  Circulatory failure and cardiac failure   Critical care was time spent personally by me on the following activities:  Discussions with consultants, evaluation of patient's response to treatment, examination of patient, ordering and performing treatments and interventions, ordering and review of laboratory studies, ordering and review of radiographic studies, pulse oximetry, re-evaluation of patient's condition, obtaining history from patient or surrogate and review of old charts   I assumed direction of critical care for this patient from another provider in my specialty: no     (including critical care time)  Medications Ordered in ED Medications  diltiazem (CARDIZEM) 1 mg/mL load via infusion 20 mg (20 mg Intravenous Bolus from Bag 04/15/19 1343)    And  diltiazem (CARDIZEM) 100 mg in dextrose 5% 153mL (1 mg/mL) infusion (  5 mg/hr Intravenous New Bag/Given 04/15/19 1343)  acetaminophen (TYLENOL) tablet 650 mg (has no  administration in time range)  ondansetron (ZOFRAN) injection 4 mg (has no administration in time range)  apixaban (ELIQUIS) tablet 5 mg (has no administration in time range)  insulin aspart (novoLOG) injection 0-9 Units (has no administration in time range)  sodium chloride flush (NS) 0.9 % injection 3 mL (3 mLs Intravenous Given 04/15/19 1738)  furosemide (LASIX) injection 40 mg (40 mg Intravenous Given 04/15/19 1442)  potassium chloride SA (K-DUR) CR tablet 40 mEq (40 mEq Oral Given 04/15/19 1518)     Initial Impression / Assessment and Plan / ED Course  I have reviewed the triage vital signs and the nursing notes.  Pertinent labs & imaging results that were available during my care of the patient were reviewed by me and considered in my medical decision making (see chart for details).        Patient presenting for evaluation of progressively worsening shortness of breath, chest pains, bilateral lower extremity edema, generalized weakness.  He is afebrile, persistently hypertensive in the ED.  Found to be in A. fib with RVR on EKG, started on Cardizem bolus and drip with improvement.  Placed on supplemental oxygen for comfort with improvement.  Chest x-ray shows small right pleural effusion with associated right basilar atelectasis and his BNP is somewhat elevated as well.  Suspect there is some component of CHF exacerbation contributing to his symptoms.  His high-sensitivity troponin is mildly elevated and serial troponins are stable.  Low suspicion of ACS/MI, feel more likely his high-sensitivity troponin elevation is secondary to demand ischemia.  Doubt dissection, cardiac tamponade, PE, pneumothorax, or pneumonia.  Remainder of lab work reviewed by me shows no leukocytosis, mild anemia, mild hypokalemia, no renal insufficiency.  Will give dose of IV Lasix in the ED.  CONSULT: Spoke with Dr. Harrell Gave with Cardiology who will see and assess the patient emergently in the ED.   Dr.  Harrell Gave has seen and evaluated the patient in the ED; plan for cardiology to admit for further evaluation and management. COVID test negative.   Final Clinical Impressions(s) / ED Diagnoses   Final diagnoses:  Atrial fibrillation with rapid ventricular response (HCC)  Atrial fibrillation with RVR ([place])  Acute on chronic congestive heart failure, unspecified heart failure type [facility])    ED Discharge Orders    None       Renita Papa, PA-C 04/15/19 1803    Lajean Saver, MD 04/16/19 [phone]

## 2019-04-15 NOTE — H&P (Signed)
Cardiology Admission History and Physical:   Patient ID: Jack Mejia MRN: [id number]; DOB: [birth year]   Admission date: 04/15/2019  Primary Care Provider: [name], MD Primary Cardiologist: [name], MD Primary Electrophysiologist:  None   Chief Complaint:  Shortness of breath and tachycardia  Patient Profile:   Jack Mejia is a 73 y.o. male with PMH paroxysmal atrial fibrillation, chronic diastolic heart failure with LE edema, hypertension, CKD stage 3 who presents with worsening shortness of breath and tachycardia.  History of Present Illness:   Mr. Spiker has been gradually worsening over the last several weeks. He brings a log of BP and HR from home, which show elevations in both. He thinks his weight fluctuates within 10 lb range but now thinks he may be up 20 lbs. No fevers/chills, no chest pain. He has been having worsening shortness of breath. He was attempted to be managed outpatient, but ultimately he presented with severe symptoms.  Endorses shortness of breath at rest and with normal exertion; endorses PND, orthopnea, LE edema and unexpected weight gain. No syncope or chest pain.  Heart Pathway Score:     Past Medical History:  Diagnosis Date  . Arthritis   . Cramps of left lower extremity   . Depression   . Diabetes mellitus    type 2 for 7-8 yrs  . Dyspnea   . Elevated liver enzymes   . Fatty liver   . History of kidney stones   . Hyperlipidemia   . Hypertension   . Vertigo     Past Surgical History:  Procedure Laterality Date  . CARPAL TUNNEL RELEASE     both wrist  . cataract surgery     bilateral  . CHOLECYSTECTOMY    . EYE SURGERY    . HEMORROIDECTOMY    . LUMBAR LAMINECTOMY/DECOMPRESSION MICRODISCECTOMY N/A 08/06/2016   Procedure: LUMBAR THREE- LUMBAR FIVE  DECOMPRESSIVE LUMBAR LAMINECTOMY;  Surgeon: Jovita Gamma, MD;  Location: [facility];  Service: Neurosurgery;  Laterality: N/A;     Medications Prior to Admission:  Prior to Admission medications   Medication Sig Start Date End Date Taking? Authorizing Provider  apixaban (ELIQUIS) 5 MG TABS tablet Take 1 tablet (5 mg total) by mouth 2 (two) times daily. 02/22/19  Yes Elgergawy, Silver Huguenin, MD  Cyanocobalamin (VITAMIN B 12 PO) Take 2,500 mcg by mouth daily.   Yes [provider]  diltiazem (CARDIZEM CD) 120 MG 24 hr capsule Take 1 capsule (120 mg total) by mouth 2 (two) times daily. 04/06/19  Yes Herminio Commons, MD  glipiZIDE (GLUCOTROL) 5 MG tablet TAKE 2 TABLETS BY MOUTH TWICE A DAY Patient taking differently: Take 10 mg by mouth 2 (two) times daily before a meal.  11/01/18  Yes [name], MD  hydrochlorothiazide (HYDRODIURIL) 25 MG tablet Take 1 tablet (25 mg total) by mouth daily. 03/15/19 06/13/19 Yes Strader, Fransisco Hertz, PA-C  metFORMIN (GLUCOPHAGE) 500 MG tablet TAKE 2 TABLETS BY MOUTH TWICE A DAY Patient taking differently: Take 1,000 mg by mouth 2 (two) times daily with a meal.  11/01/18  Yes [name], MD  Omega-3 Fatty Acids (OMEGA-3 FISH OIL PO) Take 1 capsule by mouth daily.    Yes [provider]     Allergies:    Allergies  Allergen Reactions  . Demerol Nausea And Vomiting  . Vasotec [Enalapril] Cough    Social History:   Social History   Socioeconomic History  . Marital status: Divorced  Spouse name: Not on file  . Number of children: Not on file  . Years of education: Not on file  . Highest education level: Not on file  Occupational History  . Not on file  Social Needs  . Financial resource strain: Not hard at all  . Food insecurity    Worry: Never true    Inability: Never true  . Transportation needs    Medical: No    Non-medical: No  Tobacco Use  . Smoking status: Former Smoker    Types: Cigarettes    Quit date: 10/14/1982    Years since quitting: 36.5  . Smokeless tobacco: Never Used  . Tobacco comment: 29 yrs ago  Substance and Sexual Activity  . Alcohol use: No  . Drug use: No   . Sexual activity: Never    Birth control/protection: None  Lifestyle  . Physical activity    Days per week: 0 days    Minutes per session: 0 min  . Stress: Not at all  Relationships  . Social connections    Talks on phone: More than three times a week    Gets together: More than three times a week    Attends religious service: Never    Active member of club or organization: No    Attends meetings of clubs or organizations: Never    Relationship status: Divorced  . Intimate partner violence    Fear of current or ex partner: No    Emotionally abused: No    Physically abused: No    Forced sexual activity: No  Other Topics Concern  . Not on file  Social History Narrative  . Not on file    Family History:   The patient's family history includes Cancer in his mother; Diabetes in his maternal grandmother, mother, and sister.    ROS:  Please see the history of present illness.  Constitutional: Negative for chills, fever, night sweats, unintentional weight loss  HENT: Negative for ear pain and hearing loss.   Eyes: Negative for loss of vision and eye pain.  Respiratory: Positive for cough   Cardiovascular: See HPI. Gastrointestinal: Negative for abdominal pain, melena, and hematochezia.  Genitourinary: Negative for dysuria and hematuria.  Musculoskeletal: Negative for falls and myalgias.  Skin: Negative for itching and rash.  Neurological: Negative for focal weakness, focal sensory changes and loss of consciousness.  Endo/Heme/Allergies: Does not bruise/bleed easily.  All other ROS reviewed and negative.     Physical Exam/Data:   Vitals:   04/15/19 1445 04/15/19 1500 04/15/19 1515 04/15/19 1715  BP: 133/83 (!) 151/79 (!) 159/87 (!) 154/70  Pulse: (!) 59 (!) 50 (!) 42 (!) 106  Resp: (!) 29 (!) 28 (!) 21 (!) 22  Temp:      SpO2: 97% 94% 98% 97%  Weight:      Height:       No intake or output data in the 24 hours ending 04/15/19 1735 Last 3 Weights 04/15/2019 03/15/2019  03/01/2019  Weight (lbs) 216 lb 218 lb 219 lb 12.8 oz  Weight (kg) 97.977 kg 98.884 kg 99.701 kg     Body mass index is 30.99 kg/m.  General:  Well nourished, well developed, in no acute distress HEENT: normal Lymph: no adenopathy Neck: JVD elevated to mid neck at 90 degrees Endocrine:  No thryomegaly Vascular: No carotid bruits; RA pulses 2+ bilaterally Cardiac:  Tachycardic, irregularly irregular, no appreciated murmurs Lungs:  Rales in bilateral lungs halfway up, worse at bases Abd:  soft, nontender, no hepatomegaly  Ext: bilateral 1+ LE edema Musculoskeletal:  No deformities, BUE and BLE strength normal and equal Skin: warm and dry  Neuro:  CNs 2-12 intact, no focal abnormalities noted Psych:  Normal affect    EKG:  The ECG that was done today was personally reviewed and demonstrates afib RVR  Relevant CV Studies: Prior echo reviewed  Laboratory Data:  High Sensitivity Troponin:   Recent Labs  Lab 04/15/19 1324 04/15/19 1538  TROPONINIHS 26* 32*      Chemistry Recent Labs  Lab 04/15/19 1324  NA 138  K 3.3*  CL 103  CO2 22  GLUCOSE 154*  BUN 12  CREATININE 1.24  CALCIUM 9.3  GFRNONAA 58*  GFRAA >60  ANIONGAP 13    No results for input(s): PROT, ALBUMIN, AST, ALT, ALKPHOS, BILITOT in the last 168 hours. Hematology Recent Labs  Lab 04/15/19 1324  WBC 5.5  RBC 3.35*  HGB 11.9*  HCT 36.3*  MCV 108.4*  MCH 35.5*  MCHC 32.8  RDW 14.8  PLT 195   BNP Recent Labs  Lab 04/15/19 1324  BNP 236.1*    DDimer No results for input(s): DDIMER in the last 168 hours.   Radiology/Studies:  Dg Chest Portable 1 View  Result Date: 04/15/2019 CLINICAL DATA:  Chest pain, shortness of breath EXAM: PORTABLE CHEST 1 VIEW COMPARISON:  02/21/2019 FINDINGS: Stable cardiomediastinal contours. Small right pleural effusion with associated basilar atelectasis. No pneumothorax. Advanced degenerative changes of the shoulders. IMPRESSION: Small right pleural effusion with  associated right basilar atelectasis. Electronically Signed   By: Davina Poke M.D.   On: 04/15/2019 13:47    Assessment and Plan:   Acute on chronic diastolic heart failure, atrial fibrillation with RVR -evidence by effusion on CXR, elevated BNP, LE edema, rales on exam -will need diuresis and heart rate control -continue diltiazem drip, HR improving. Will await HR control prior to conversion to oral -will diurese with 40 mg IV BID lasix -continue apixaban 5 mg BID  Diabetes: hold metformin, AC/HS checks with SSI  Severity of Illness: The appropriate patient status for this patient is INPATIENT. Inpatient status is judged to be reasonable and necessary in order to provide the required intensity of service to ensure the patient's safety. The patient's presenting symptoms, physical exam findings, and initial radiographic and laboratory data in the context of their chronic comorbidities is felt to place them at high risk for further clinical deterioration. Furthermore, it is not anticipated that the patient will be medically stable for discharge from the hospital within 2 midnights of admission. The following factors support the patient status of inpatient.   " The patient's presenting symptoms include SOB, LE edema, tachycardia. " The worrisome physical exam findings include rales, LE edema, tachycardia. " The initial radiographic and laboratory data are worrisome because of pleural effusion, elevated BNP. " The chronic co-morbidities include afib, HFpEF.   * I certify that at the point of admission it is my clinical judgment that the patient will require inpatient hospital care spanning beyond 2 midnights from the point of admission due to high intensity of service, high risk for further deterioration and high frequency of surveillance required.*    For questions or updates, please contact [facility] Please consult [email or web address] for contact info under        Signed, Buford Dresser, MD  04/15/2019 5:35 PM

## 2019-04-16 DIAGNOSIS — E876 Hypokalemia: Secondary | ICD-10-CM

## 2019-04-16 LAB — BASIC METABOLIC PANEL
Anion gap: 13 (ref 5–15)
Anion gap: 12 (ref 5–15)
BUN: 12 mg/dL (ref 8–23)
BUN: 15 mg/dL (ref 8–23)
CO2: 24 mmol/L (ref 22–32)
CO2: 25 mmol/L (ref 22–32)
Calcium: 9.1 mg/dL (ref 8.9–10.3)
Calcium: 8.8 mg/dL — ABNORMAL LOW (ref 8.9–10.3)
Chloride: 100 mmol/L (ref 98–111)
Chloride: 100 mmol/L (ref 98–111)
Creatinine, Ser: 1.31 mg/dL — ABNORMAL HIGH (ref 0.61–1.24)
Creatinine, Ser: 1.52 mg/dL — ABNORMAL HIGH (ref 0.61–1.24)
GFR calc Af Amer: >60 mL/min (ref >60)
GFR calc Af Amer: 52 mL/min — ABNORMAL LOW (ref >60)
GFR calc non Af Amer: 54 mL/min — ABNORMAL LOW (ref >60)
GFR calc non Af Amer: 45 mL/min — ABNORMAL LOW (ref >60)
Glucose, Bld: 189 mg/dL — ABNORMAL HIGH (ref 70–99)
Glucose, Bld: 305 mg/dL — ABNORMAL HIGH (ref 70–99)
Potassium: 3.3 mmol/L — ABNORMAL LOW (ref 3.5–5.1)
Potassium: 3.9 mmol/L (ref 3.5–5.1)
Sodium: 137 mmol/L (ref 135–145)
Sodium: 137 mmol/L (ref 135–145)

## 2019-04-16 LAB — CBC
HCT: 36.1 % — ABNORMAL LOW (ref 39.0–52.0)
Hemoglobin: 11.6 g/dL — ABNORMAL LOW (ref 13.0–17.0)
MCH: 35 pg — ABNORMAL HIGH (ref 26.0–34.0)
MCHC: 32.1 g/dL (ref 30.0–36.0)
MCV: 109.1 fL — ABNORMAL HIGH (ref 80.0–100.0)
Platelets: 183 10*3/uL (ref 150–400)
RBC: 3.31 MIL/uL — ABNORMAL LOW (ref 4.22–5.81)
RDW: 14.6 % (ref 11.5–15.5)
WBC: 4.5 10*3/uL (ref 4.0–10.5)
nRBC: 0 % (ref 0.0–0.2)

## 2019-04-16 LAB — GLUCOSE, CAPILLARY
Glucose-Capillary: 233 mg/dL — ABNORMAL HIGH (ref 70–99)
Glucose-Capillary: 296 mg/dL — ABNORMAL HIGH (ref 70–99)
Glucose-Capillary: 297 mg/dL — ABNORMAL HIGH (ref 70–99)

## 2019-04-16 LAB — CBG MONITORING, ED: Glucose-Capillary: 252 mg/dL — ABNORMAL HIGH (ref 70–99)

## 2019-04-16 LAB — MAGNESIUM: Magnesium: 1.3 mg/dL — ABNORMAL LOW (ref 1.7–2.4)

## 2019-04-16 MED ORDER — DILTIAZEM HCL ER COATED BEADS 300 MG PO CP24: 300.0000 mg | ORAL_CAPSULE | Freq: Every day | ORAL | Status: DC

## 2019-04-16 MED ORDER — MAGNESIUM SULFATE 2 GM/50ML IV SOLN
2.0000 g | Freq: Once | INTRAVENOUS | Status: AC
  Administered 2019-04-16: 2 g via INTRAVENOUS
  Filled 2019-04-16: qty 50

## 2019-04-16 MED ORDER — POTASSIUM CHLORIDE CRYS ER 20 MEQ PO TBCR
40.0000 meq | EXTENDED_RELEASE_TABLET | Freq: Once | ORAL | Status: AC
  Administered 2019-04-16: 40 meq via ORAL
  Filled 2019-04-16: qty 2

## 2019-04-16 MED ORDER — DILTIAZEM HCL ER COATED BEADS 180 MG PO CP24
360.0000 mg | ORAL_CAPSULE | Freq: Every day | ORAL | Status: DC
  Administered 2019-04-16 – 2019-04-21 (×6): 360 mg via ORAL
  Filled 2019-04-16: qty 3
  Filled 2019-04-16 (×6): qty 2

## 2019-04-16 MED ORDER — POTASSIUM CHLORIDE CRYS ER 20 MEQ PO TBCR
40.0000 meq | EXTENDED_RELEASE_TABLET | Freq: Two times a day (BID) | ORAL | Status: AC
  Administered 2019-04-16 (×2): 40 meq via ORAL
  Filled 2019-04-16 (×2): qty 2

## 2019-04-16 NOTE — ED Notes (Signed)
Attempted report x1. 

## 2019-04-16 NOTE — Progress Notes (Signed)
    Made aware by patient's nurse he had a 22 beat run of VT. Was asymptomatic with the episode. He was hypokalemic this AM. Will recheck STAT BMET and Mg. Continue to follow on telemetry.   Signed, Erma Heritage, PA-C 04/16/2019, 4:01 PM Pager: [phone]

## 2019-04-16 NOTE — ED Notes (Signed)
CBG 290 

## 2019-04-16 NOTE — Progress Notes (Signed)
Progress Note  Patient Name: Jack Mejia Date of Encounter: 04/16/2019  Primary Cardiologist: [name], MD   Subjective   Feeling better this AM. Back in SR this morning. I cannot see when he converted as he switched rooms in ER, but when he got to his new room at 2 AM (and was put on telemetry) he was in sinus rhythm. Breathing is a little better but not at baseline. No chest pain.  Inpatient Medications    Scheduled Meds: . apixaban  5 mg Oral BID  . diltiazem  360 mg Oral Daily  . furosemide  40 mg Intravenous BID  . insulin aspart  0-9 Units Subcutaneous TID WC  . potassium chloride  40 mEq Oral BID   Continuous Infusions:  PRN Meds: acetaminophen, ondansetron (ZOFRAN) IV   Vital Signs    Vitals:   04/16/19 0545 04/16/19 0630 04/16/19 0700 04/16/19 0745  BP: (!) 154/66 (!) 164/78 (!) 161/80 (!) 142/65  Pulse: 71 75 73 92  Resp: 18 (!) 23 18 (!) 22  Temp:      SpO2: 96% 95% 94% 94%  Weight:      Height:        Intake/Output Summary (Last 24 hours) at 04/16/2019 0945 Last data filed at 04/15/2019 2312 Gross per 24 hour  Intake 103.64 ml  Output 650 ml  Net -546.36 ml   Last 3 Weights 04/15/2019 03/15/2019 03/01/2019  Weight (lbs) 216 lb 218 lb 219 lb 12.8 oz  Weight (kg) 97.977 kg 98.884 kg 99.701 kg      Telemetry    SR since at least 2 AM - Personally Reviewed  ECG    No new since yesterday - Personally Reviewed  Physical Exam   GEN: No acute distress.  [place] in place Neck: JVD to low neck at 90 degrees Cardiac: RRR, no murmurs, rubs, or gallops.  Respiratory: Clear in upper fields but crackles at bilateral bases GI: Soft, nontender, non-distended  MS: bilateral trace to 1+ LE  edema; No deformity. Neuro:  Nonfocal  Psych: Normal affect   Labs    High Sensitivity Troponin:   Recent Labs  Lab 04/15/19 1324 04/15/19 1538  TROPONINIHS 26* 32*      Chemistry Recent Labs  Lab 04/15/19 1324 04/16/19 0330  NA 138 137  K 3.3*  3.3*  CL 103 100  CO2 22 24  GLUCOSE 154* 189*  BUN 12 12  CREATININE 1.24 1.31*  CALCIUM 9.3 9.1  GFRNONAA 58* 54*  GFRAA >60 >60  ANIONGAP 13 13     Hematology Recent Labs  Lab 04/15/19 1324 04/16/19 0330  WBC 5.5 4.5  RBC 3.35* 3.31*  HGB 11.9* 11.6*  HCT 36.3* 36.1*  MCV 108.4* 109.1*  MCH 35.5* 35.0*  MCHC 32.8 32.1  RDW 14.8 14.6  PLT 195 183    BNP Recent Labs  Lab 04/15/19 1324  BNP 236.1*     DDimer No results for input(s): DDIMER in the last 168 hours.   Radiology    Dg Chest Portable 1 View  Result Date: 04/15/2019 CLINICAL DATA:  Chest pain, shortness of breath EXAM: PORTABLE CHEST 1 VIEW COMPARISON:  02/21/2019 FINDINGS: Stable cardiomediastinal contours. Small right pleural effusion with associated basilar atelectasis. No pneumothorax. Advanced degenerative changes of the shoulders. IMPRESSION: Small right pleural effusion with associated right basilar atelectasis. Electronically Signed   By: Davina Poke M.D.   On: 04/15/2019 13:47    Cardiac Studies   Echo 02/22/19  1. The left ventricle has normal systolic function with an ejection fraction of 60-65%. The cavity size was normal. There is moderately increased left ventricular wall thickness. Left ventricular diastolic Doppler parameters are indeterminate.  2. The right ventricle has normal systolic function. The cavity was normal. There is no increase in right ventricular wall thickness.  3. No evidence of mitral valve stenosis.  4. The aortic valve is tricuspid. Mild thickening of the aortic valve. Mild calcification of the aortic valve. No stenosis of the aortic valve. Mild aortic annular calcification noted.  5. The aorta is normal in size and structure.  6. The aortic root is normal in size and structure.  7. Pulmonary hypertension is indeterminate, inadequate TR jet.  Patient Profile     73 y.o. male PMH paroxysmal atrial fibrillation, chronic diastolic heart failure with LE edema,  hypertension, CKD stage 3 who presents with worsening shortness of breath and tachycardia.  Assessment & Plan    Acute on chronic diastolic heart failure, atrial fibrillation with RVR -evidence by effusion on CXR, elevated BNP, LE edema, rales on exam -unclear if all urine collected (he says he has gone a lot) and wife was emptying for him yesterday. At least 500 cc negative. Edema, JVD improving but not yet at baseline -back in SR overnight. Stopping diltiazem drip, converting to equivalent oral (which is higher than his home dose) -continue to diurese with 40 mg IV BID lasix -continue apixaban 5 mg BID  Diabetes: hold metformin, AC/HS checks with SSI  Hypokalemia: repleting with 40 PO BID x2 doses given continued diuresis  Expect at least 1-2 more days of diuresis depending on response.  For questions or updates, please contact [facility] Please consult [email or web address] for contact info under    Signed, Buford Dresser, MD  04/16/2019, 9:45 AM

## 2019-04-16 NOTE — ED Notes (Signed)
ED TO INPATIENT HANDOFF REPORT  ED Nurse Name and Phone #: Percell Locus, RN  S Name/Age/Gender Jack Mejia 73 y.o. male Room/Bed: 013C/013C  Code Status   Code Status: Full Code  Home/SNF/Other Home Patient oriented to: self, place, time and situation Is this baseline? Yes   Triage Complete: Triage complete  Chief Complaint chest pain-sob-afib  Triage Note Pt with chest pain, cough, sob when supine and tachycardia x 5 weeks.  Came today b/c sob, pain and LE edema is getting worse.  Hr 157 in triage.   Allergies Allergies  Allergen Reactions  . Demerol Nausea And Vomiting  . Vasotec [Enalapril] Cough    Level of Care/Admitting Diagnosis ED Disposition    ED Disposition Condition [facility] Area: [place] [[id number]]  Level of Care: Telemetry Cardiac [103]  Covid Evaluation: Confirmed COVID Negative  Diagnosis: Acute on chronic diastolic (congestive) heart failure [facility]) [[id number]]  Admitting Physician: Buford Dresser [[id number]]  Attending Physician: Buford Dresser [[id number]]  Estimated length of stay: past midnight tomorrow  Certification:: I certify this patient will need inpatient services for at least 2 midnights  PT Class (Do Not Modify): Inpatient [101]  PT Acc Code (Do Not Modify): Private [1]       B Medical/Surgery History Past Medical History:  Diagnosis Date  . Arthritis   . Cramps of left lower extremity   . Depression   . Diabetes mellitus    type 2 for 7-8 yrs  . Dyspnea   . Elevated liver enzymes   . Fatty liver   . History of kidney stones   . Hyperlipidemia   . Hypertension   . Vertigo    Past Surgical History:  Procedure Laterality Date  . CARPAL TUNNEL RELEASE     both wrist  . cataract surgery     bilateral  . CHOLECYSTECTOMY    . EYE SURGERY    . HEMORROIDECTOMY    . LUMBAR LAMINECTOMY/DECOMPRESSION MICRODISCECTOMY N/A 08/06/2016   Procedure: LUMBAR THREE- LUMBAR FIVE  DECOMPRESSIVE  LUMBAR LAMINECTOMY;  Surgeon: Jovita Gamma, MD;  Location: [place];  Service: Neurosurgery;  Laterality: N/A;     A IV Location/Drains/Wounds Patient Lines/Drains/Airways Status   Active Line/Drains/Airways    Name:   Placement date:   Placement time:   Site:   Days:   Peripheral IV 04/15/19 Left Antecubital   04/15/19    1332    Antecubital   1   Incision (Closed) 08/06/16 Back Other (Comment)   08/06/16    0905     983          Intake/Output Last 24 hours  Intake/Output Summary (Last 24 hours) at 04/16/2019 1056 Last data filed at 04/15/2019 2312 Gross per 24 hour  Intake 103.64 ml  Output 650 ml  Net -546.36 ml    Labs/Imaging Results for orders placed or performed during the hospital encounter of 04/15/19 (from the past 48 hour(s))  Basic metabolic panel     Status: Abnormal   Collection Time: 04/15/19  1:24 PM  Result Value Ref Range   Sodium 138 135 - 145 mmol/L   Potassium 3.3 (L) 3.5 - 5.1 mmol/L   Chloride 103 98 - 111 mmol/L   CO2 22 22 - 32 mmol/L   Glucose, Bld 154 (H) 70 - 99 mg/dL   BUN 12 8 - 23 mg/dL   Creatinine, Ser 1.24 0.61 - 1.24 mg/dL   Calcium 9.3 8.9 - 10.3 mg/dL  GFR calc non Af Amer 58 (L) >60 mL/min   GFR calc Af Amer >60 >60 mL/min   Anion gap 13 5 - 15    Comment: Performed at [facility] [address] [place], [place] [address]  CBC     Status: Abnormal   Collection Time: 04/15/19  1:24 PM  Result Value Ref Range   WBC 5.5 4.0 - 10.5 K/uL   RBC 3.35 (L) 4.22 - 5.81 MIL/uL   Hemoglobin 11.9 (L) 13.0 - 17.0 g/dL   HCT 36.3 (L) 39.0 - 52.0 %   MCV 108.4 (H) 80.0 - 100.0 fL   MCH 35.5 (H) 26.0 - 34.0 pg   MCHC 32.8 30.0 - 36.0 g/dL   RDW 14.8 11.5 - 15.5 %   Platelets 195 150 - 400 K/uL   nRBC 0.0 0.0 - 0.2 %    Comment: Performed at [facility], [place] [address] [place], [place] [address]  Troponin I (High Sensitivity)     Status: Abnormal   Collection Time: 04/15/19  1:24 PM  Result Value Ref Range   Troponin I  (High Sensitivity) 26 (H) <18 ng/L    Comment: (NOTE) Elevated high sensitivity troponin I (hsTnI) values and significant  changes across serial measurements may suggest ACS but many other  chronic and acute conditions are known to elevate hsTnI results.  Refer to the "Links" section for chest pain algorithms and additional  guidance. Performed at [facility], [place] [address], [place], [place] [address]   Brain natriuretic peptide     Status: Abnormal   Collection Time: 04/15/19  1:24 PM  Result Value Ref Range   B Natriuretic Peptide 236.1 (H) 0.0 - 100.0 pg/mL    Comment: Performed at [facility] [address], [place], [place] [address]  SARS Coronavirus 2 [facility] order, Performed in [facility] hospital lab) Nasopharyngeal Nasopharyngeal Swab     Status: None   Collection Time: 04/15/19  2:50 PM   Specimen: Nasopharyngeal Swab  Result Value Ref Range   SARS Coronavirus 2 NEGATIVE NEGATIVE    Comment: (NOTE) If result is NEGATIVE SARS-CoV-2 target nucleic acids are NOT DETECTED. The SARS-CoV-2 RNA is generally detectable in upper and lower  respiratory specimens during the acute phase of infection. The lowest  concentration of SARS-CoV-2 viral copies this assay can detect is 250  copies / mL. A negative result does not preclude SARS-CoV-2 infection  and should not be used as the sole basis for treatment or other  patient management decisions.  A negative result may occur with  improper specimen collection / handling, submission of specimen other  than nasopharyngeal swab, presence of viral mutation(s) within the  areas targeted by this assay, and inadequate number of viral copies  (<250 copies / mL). A negative result must be combined with clinical  observations, patient history, and epidemiological information. If result is POSITIVE SARS-CoV-2 target nucleic acids are DETECTED. The SARS-CoV-2 RNA is generally detectable in upper and lower  respiratory  specimens dur ing the acute phase of infection.  Positive  results are indicative of active infection with SARS-CoV-2.  Clinical  correlation with patient history and other diagnostic information is  necessary to determine patient infection status.  Positive results do  not rule out bacterial infection or co-infection with other viruses. If result is PRESUMPTIVE POSTIVE SARS-CoV-2 nucleic acids MAY BE PRESENT.   A presumptive positive result was obtained on the submitted specimen  and confirmed on repeat testing.  While 2019 novel coronavirus  (SARS-CoV-2) nucleic acids may be present in the submitted sample  additional confirmatory testing may be necessary for epidemiological  and / or clinical management purposes  to differentiate between  SARS-CoV-2 and other Sarbecovirus currently known to infect humans.  If clinically indicated additional testing with an alternate test  methodology [phone]) is advised. The SARS-CoV-2 RNA is generally  detectable in upper and lower respiratory sp ecimens during the acute  phase of infection. The expected result is Negative. Fact Sheet for Patients:  StrictlyIdeas.no Fact Sheet for Healthcare Providers: BankingDealers.co.za This test is not yet approved or cleared by the [place] FDA and has been authorized for detection and/or diagnosis of SARS-CoV-2 by FDA under an Emergency Use Authorization (EUA).  This EUA will remain in effect (meaning this test can be used) for the duration of the COVID-19 declaration under Section 564(b)(1) of the Act, 21 U.S.C. section 360bbb-3(b)(1), unless the authorization is terminated or revoked sooner. Performed at [facility], [place] [address] [place], [place] [address]   Troponin I (High Sensitivity)     Status: Abnormal   Collection Time: 04/15/19  3:38 PM  Result Value Ref Range   Troponin I (High Sensitivity) 32 (H) <18 ng/L    Comment:  (NOTE) Elevated high sensitivity troponin I (hsTnI) values and significant  changes across serial measurements may suggest ACS but many other  chronic and acute conditions are known to elevate hsTnI results.  Refer to the "Links" section for chest pain algorithms and additional  guidance. Performed at [facility], [place] [address], [place], [place] [address]   CBG monitoring, ED     Status: Abnormal   Collection Time: 04/15/19 10:25 PM  Result Value Ref Range   Glucose-Capillary 220 (H) 70 - 99 mg/dL  Basic metabolic panel     Status: Abnormal   Collection Time: 04/16/19  3:30 AM  Result Value Ref Range   Sodium 137 135 - 145 mmol/L   Potassium 3.3 (L) 3.5 - 5.1 mmol/L   Chloride 100 98 - 111 mmol/L   CO2 24 22 - 32 mmol/L   Glucose, Bld 189 (H) 70 - 99 mg/dL   BUN 12 8 - 23 mg/dL   Creatinine, Ser 1.31 (H) 0.61 - 1.24 mg/dL   Calcium 9.1 8.9 - 10.3 mg/dL   GFR calc non Af Amer 54 (L) >60 mL/min   GFR calc Af Amer >60 >60 mL/min   Anion gap 13 5 - 15    Comment: Performed at [facility], [place] [address], [place], [place] [address]  CBC     Status: Abnormal   Collection Time: 04/16/19  3:30 AM  Result Value Ref Range   WBC 4.5 4.0 - 10.5 K/uL   RBC 3.31 (L) 4.22 - 5.81 MIL/uL   Hemoglobin 11.6 (L) 13.0 - 17.0 g/dL   HCT 36.1 (L) 39.0 - 52.0 %   MCV 109.1 (H) 80.0 - 100.0 fL   MCH 35.0 (H) 26.0 - 34.0 pg   MCHC 32.1 30.0 - 36.0 g/dL   RDW 14.6 11.5 - 15.5 %   Platelets 183 150 - 400 K/uL   nRBC 0.0 0.0 - 0.2 %    Comment: Performed at [facility], [place] [address], [place], [place] [address]  CBG monitoring, ED     Status: Abnormal   Collection Time: 04/16/19  8:53 AM  Result Value Ref Range  Glucose-Capillary 252 (H) 70 - 99 mg/dL   Dg Chest Portable 1 View  Result Date: 04/15/2019 CLINICAL DATA:  Chest pain, shortness of breath EXAM: PORTABLE CHEST 1 VIEW COMPARISON:  02/21/2019 FINDINGS: Stable cardiomediastinal contours. Small right pleural effusion  with associated basilar atelectasis. No pneumothorax. Advanced degenerative changes of the shoulders. IMPRESSION: Small right pleural effusion with associated right basilar atelectasis. Electronically Signed   By: Davina Poke M.D.   On: 04/15/2019 13:47    Pending Labs Unresulted Labs (From admission, onward)    Start     Ordered   04/17/19 0131  Basic metabolic panel  Daily,   R     04/16/19 0941          Vitals/Pain Today's Vitals   04/16/19 0630 04/16/19 0700 04/16/19 0745 04/16/19 1037  BP: (!) 164/78 (!) 161/80 (!) 142/65 (!) 157/97  Pulse: 75 73 92 87  Resp: (!) 23 18 (!) 22 (!) 22  Temp:      SpO2: 95% 94% 94% 91%  Weight:      Height:      PainSc:        Isolation Precautions No active isolations  Medications Medications  acetaminophen (TYLENOL) tablet 650 mg (has no administration in time range)  ondansetron (ZOFRAN) injection 4 mg (has no administration in time range)  apixaban (ELIQUIS) tablet 5 mg (5 mg Oral Given 04/16/19 1035)  furosemide (LASIX) injection 40 mg (40 mg Intravenous Given 04/16/19 1038)  insulin aspart (novoLOG) injection 0-9 Units (5 Units Subcutaneous Given 04/16/19 1051)  potassium chloride SA (K-DUR) CR tablet 40 mEq (40 mEq Oral Given 04/16/19 1035)  diltiazem (CARDIZEM CD) 24 hr capsule 360 mg (360 mg Oral Given 04/16/19 1036)  sodium chloride flush (NS) 0.9 % injection 3 mL (3 mLs Intravenous Given 04/15/19 1738)  diltiazem (CARDIZEM) 1 mg/mL load via infusion 20 mg (20 mg Intravenous Bolus from Bag 04/15/19 1343)  furosemide (LASIX) injection 40 mg (40 mg Intravenous Given 04/15/19 1442)  potassium chloride SA (K-DUR) CR tablet 40 mEq (40 mEq Oral Given 04/15/19 1518)  insulin aspart (novoLOG) injection 4 Units (4 Units Subcutaneous Given 04/15/19 2258)    Mobility walks with person assist Moderate fall risk   Focused Assessments Cardiac Assessment Handoff:  Cardiac Rhythm: Atrial fibrillation Lab Results  Component Value Date    CKTOTAL 87 09/07/2018   TROPONINI <0.03 03/02/2017   No results found for: DDIMER Does the Patient currently have chest pain? No     R Recommendations: See Admitting Provider Note  Report given to:   Additional Notes:

## 2019-04-16 NOTE — ED Notes (Signed)
Tele Ordered bfast 

## 2019-04-16 NOTE — Progress Notes (Addendum)
Pt had 22 beats run of Vtach while resting in bed.  He was asymptomatic.  Cardiology made aware. Received order for Stat BMET and Magnesium.   Idolina Primer, RN

## 2019-04-17 ENCOUNTER — Encounter (HOSPITAL_COMMUNITY): Payer: Self-pay

## 2019-04-17 DIAGNOSIS — I4891 Unspecified atrial fibrillation: Secondary | ICD-10-CM

## 2019-04-17 DIAGNOSIS — E1165 Type 2 diabetes mellitus with hyperglycemia: Secondary | ICD-10-CM

## 2019-04-17 LAB — MAGNESIUM: Magnesium: 1.8 mg/dL (ref 1.7–2.4)

## 2019-04-17 LAB — BASIC METABOLIC PANEL
Anion gap: 12 (ref 5–15)
BUN: 16 mg/dL (ref 8–23)
CO2: 24 mmol/L (ref 22–32)
Calcium: 9.4 mg/dL (ref 8.9–10.3)
Chloride: 103 mmol/L (ref 98–111)
Creatinine, Ser: 1.62 mg/dL — ABNORMAL HIGH (ref 0.61–1.24)
GFR calc Af Amer: 48 mL/min — ABNORMAL LOW (ref >60)
GFR calc non Af Amer: 42 mL/min — ABNORMAL LOW (ref >60)
Glucose, Bld: 226 mg/dL — ABNORMAL HIGH (ref 70–99)
Potassium: 4.1 mmol/L (ref 3.5–5.1)
Sodium: 139 mmol/L (ref 135–145)

## 2019-04-17 LAB — GLUCOSE, CAPILLARY
Glucose-Capillary: 290 mg/dL — ABNORMAL HIGH (ref 70–99)
Glucose-Capillary: 232 mg/dL — ABNORMAL HIGH (ref 70–99)
Glucose-Capillary: 302 mg/dL — ABNORMAL HIGH (ref 70–99)
Glucose-Capillary: 220 mg/dL — ABNORMAL HIGH (ref 70–99)
Glucose-Capillary: 219 mg/dL — ABNORMAL HIGH (ref 70–99)

## 2019-04-17 MED ORDER — DILTIAZEM LOAD VIA INFUSION: 10.0000 mg | Freq: Once | INTRAVENOUS | Status: DC

## 2019-04-17 MED ORDER — INSULIN ASPART 100 UNIT/ML ~~LOC~~ SOLN
0.0000 [IU] | Freq: Every day | SUBCUTANEOUS | Status: DC
  Administered 2019-04-17: 2 [IU] via SUBCUTANEOUS
  Administered 2019-04-18 – 2019-04-19 (×2): 4 [IU] via SUBCUTANEOUS

## 2019-04-17 MED ORDER — METOPROLOL TARTRATE 12.5 MG HALF TABLET
12.5000 mg | ORAL_TABLET | Freq: Two times a day (BID) | ORAL | Status: DC
  Administered 2019-04-17 – 2019-04-18 (×3): 12.5 mg via ORAL
  Filled 2019-04-17 (×3): qty 1

## 2019-04-17 MED ORDER — GLIPIZIDE 10 MG PO TABS
10.0000 mg | ORAL_TABLET | Freq: Two times a day (BID) | ORAL | Status: DC
  Administered 2019-04-17 – 2019-04-21 (×9): 10 mg via ORAL
  Filled 2019-04-17 (×9): qty 1

## 2019-04-17 MED ORDER — INSULIN ASPART 100 UNIT/ML ~~LOC~~ SOLN
0.0000 [IU] | Freq: Three times a day (TID) | SUBCUTANEOUS | Status: DC
  Administered 2019-04-17: 5 [IU] via SUBCUTANEOUS
  Administered 2019-04-17: 11 [IU] via SUBCUTANEOUS
  Administered 2019-04-17: 5 [IU] via SUBCUTANEOUS
  Administered 2019-04-18: 3 [IU] via SUBCUTANEOUS
  Administered 2019-04-18: 8 [IU] via SUBCUTANEOUS
  Administered 2019-04-18 – 2019-04-19 (×3): 5 [IU] via SUBCUTANEOUS
  Administered 2019-04-19: 9 [IU] via SUBCUTANEOUS
  Administered 2019-04-20: 5 [IU] via SUBCUTANEOUS
  Administered 2019-04-20: 8 [IU] via SUBCUTANEOUS
  Administered 2019-04-20 – 2019-04-21 (×3): 5 [IU] via SUBCUTANEOUS

## 2019-04-17 MED ORDER — DILTIAZEM HCL 25 MG/5ML IV SOLN
10.0000 mg | Freq: Once | INTRAVENOUS | Status: AC
  Administered 2019-04-17: 10 mg via INTRAVENOUS
  Filled 2019-04-17: qty 5

## 2019-04-17 MED ORDER — INSULIN ASPART 100 UNIT/ML ~~LOC~~ SOLN: 0.0000 [IU] | Freq: Three times a day (TID) | SUBCUTANEOUS | Status: DC

## 2019-04-17 NOTE — Progress Notes (Signed)
Progress Note  Patient Name: Jack Mejia Date of Encounter: 04/17/2019  Primary Cardiologist: [name], MD   Subjective   No complaints not aware that he is back in a fib rate 125 to 132, no chest pain and no SOB  Inpatient Medications    Scheduled Meds: . apixaban  5 mg Oral BID  . diltiazem  360 mg Oral Daily  . furosemide  40 mg Intravenous BID  . insulin aspart  0-9 Units Subcutaneous TID WC   Continuous Infusions:  PRN Meds: acetaminophen, ondansetron (ZOFRAN) IV   Vital Signs    Vitals:   04/16/19 1115 04/16/19 1145 04/16/19 2052 04/17/19 0438  BP: (!) 155/84 (!) 156/71 (!) 143/76 (!) 144/78  Pulse: 79 77    Resp: (!) 24 14    Temp:  97.8 F (36.6 C) 98.6 F (37 C) 98.4 F (36.9 C)  TempSrc:  Oral Oral Oral  SpO2: 96% 96% 95%   Weight:  98.7 kg  97.8 kg  Height:  5\' 10"  (1.778 m)      Intake/Output Summary (Last 24 hours) at 04/17/2019 0807 Last data filed at 04/17/2019 0655 Gross per 24 hour  Intake 475.41 ml  Output 1425 ml  Net -949.59 ml   Last 3 Weights 04/17/2019 04/16/2019 04/15/2019  Weight (lbs) 215 lb 9.6 oz 217 lb 9.6 oz 216 lb  Weight (kg) 97.796 kg 98.703 kg 97.977 kg      Telemetry    SR and NSVT to 14 beats  - Personally Reviewed  ECG    No new - Personally Reviewed  Physical Exam   GEN: No acute distress.   Neck: No JVD sitting up in bed Cardiac: irreg irreg and rapid no murmurs, rubs, or gallops.  Respiratory: Clear to diminished breath sounds to auscultation bilaterally. GI: Soft, nontender, non-distended  MS: Tr to 1+ edema; No deformity. Neuro:  Nonfocal  Psych: Normal affect   Labs    High Sensitivity Troponin:   Recent Labs  Lab 04/15/19 1324 04/15/19 1538  TROPONINIHS 26* 32*      Chemistry Recent Labs  Lab 04/16/19 0330 04/16/19 1559 04/17/19 0518  NA 137 137 139  K 3.3* 3.9 4.1  CL 100 100 103  CO2 24 25 24   GLUCOSE 189* 305* 226*  BUN 12 15 16   CREATININE 1.31* 1.52* 1.62*   CALCIUM 9.1 8.8* 9.4  GFRNONAA 54* 45* 42*  GFRAA >60 52* 48*  ANIONGAP 13 12 12      Hematology Recent Labs  Lab 04/15/19 1324 04/16/19 0330  WBC 5.5 4.5  RBC 3.35* 3.31*  HGB 11.9* 11.6*  HCT 36.3* 36.1*  MCV 108.4* 109.1*  MCH 35.5* 35.0*  MCHC 32.8 32.1  RDW 14.8 14.6  PLT 195 183    BNP Recent Labs  Lab 04/15/19 1324  BNP 236.1*     DDimer No results for input(s): DDIMER in the last 168 hours.   Radiology    Dg Chest Portable 1 View  Result Date: 04/15/2019 CLINICAL DATA:  Chest pain, shortness of breath EXAM: PORTABLE CHEST 1 VIEW COMPARISON:  02/21/2019 FINDINGS: Stable cardiomediastinal contours. Small right pleural effusion with associated basilar atelectasis. No pneumothorax. Advanced degenerative changes of the shoulders. IMPRESSION: Small right pleural effusion with associated right basilar atelectasis. Electronically Signed   By: Davina Poke M.D.   On: 04/15/2019 13:47    Cardiac Studies   Echo 02/22/19 1. The left ventricle has normal systolic function with an ejection fraction of  60-65%. The cavity size was normal. There is moderately increased left ventricular wall thickness. Left ventricular diastolic Doppler parameters are indeterminate. 2. The right ventricle has normal systolic function. The cavity was normal. There is no increase in right ventricular wall thickness. 3. No evidence of mitral valve stenosis. 4. The aortic valve is tricuspid. Mild thickening of the aortic valve. Mild calcification of the aortic valve. No stenosis of the aortic valve. Mild aortic annular calcification noted. 5. The aorta is normal in size and structure. 6. The aortic root is normal in size and structure. 7. Pulmonary hypertension is indeterminate, inadequate TR jet.  Patient Profile     73 y.o. male PMH paroxysmal atrial fibrillation, chronic diastolic heart failure with LE edema, hypertension, CKD stage 3 who presents with worsening shortness of breath  and tachycardia, admitted 04/15/19   Assessment & Plan    Acute on chronic diastolic heart failure, atrial fibrillation with RVR -evidence by effusion on CXR, elevated BNP, LE edema, rales on exam -back in SR early 04/16/19. Stopped diltiazem drip, converted to equivalent oral (which is higher than his home dose) -continue to diurese with 40 mg IV BID lasix- with increased cr. May need to back off -continue apixaban 5 mg BID -Neg 1496 and wt down from pk of 98.7 to 97.8Kg. -pt went into a fib when I walked in room.  Will give 10 mg IV dilt and continue PO   Diabetes: hold metformin, AC/HS checks with SSI  Glucose elevated 252 to 296 changed SSI to mod AC and sensitive HS    Hypokalemia: repleted and now 4.1  CKD-3 now with increased Cr to 1.62 from 1.24 on admit though over last 2 months Cr has been 1.29 to 1.69 - just rec'd AM lasix change to daily ? Defer t Dr. Debara Pickett  Hypo mg+ given 2 Gm yesterday will recheck.     For questions or updates, please contact [place] Please consult [email or web address] for contact info under        Signed, Cecilie Kicks, NP  04/17/2019, 8:07 AM

## 2019-04-18 ENCOUNTER — Inpatient Hospital Stay (HOSPITAL_COMMUNITY): Payer: Medicare HMO

## 2019-04-18 DIAGNOSIS — I483 Typical atrial flutter: Secondary | ICD-10-CM

## 2019-04-18 LAB — GLUCOSE, CAPILLARY
Glucose-Capillary: 268 mg/dL — ABNORMAL HIGH (ref 70–99)
Glucose-Capillary: 197 mg/dL — ABNORMAL HIGH (ref 70–99)
Glucose-Capillary: 248 mg/dL — ABNORMAL HIGH (ref 70–99)
Glucose-Capillary: 313 mg/dL — ABNORMAL HIGH (ref 70–99)

## 2019-04-18 LAB — BASIC METABOLIC PANEL
Anion gap: 13 (ref 5–15)
BUN: 21 mg/dL (ref 8–23)
CO2: 23 mmol/L (ref 22–32)
Calcium: 8.8 mg/dL — ABNORMAL LOW (ref 8.9–10.3)
Chloride: 100 mmol/L (ref 98–111)
Creatinine, Ser: 1.63 mg/dL — ABNORMAL HIGH (ref 0.61–1.24)
GFR calc Af Amer: 48 mL/min — ABNORMAL LOW (ref >60)
GFR calc non Af Amer: 41 mL/min — ABNORMAL LOW (ref >60)
Glucose, Bld: 187 mg/dL — ABNORMAL HIGH (ref 70–99)
Potassium: 3.7 mmol/L (ref 3.5–5.1)
Sodium: 136 mmol/L (ref 135–145)

## 2019-04-18 MED ORDER — METOPROLOL TARTRATE 25 MG PO TABS
25.0000 mg | ORAL_TABLET | Freq: Two times a day (BID) | ORAL | Status: DC
  Administered 2019-04-18 – 2019-04-21 (×6): 25 mg via ORAL
  Filled 2019-04-18 (×6): qty 1

## 2019-04-18 MED ORDER — METOPROLOL TARTRATE 12.5 MG HALF TABLET
12.5000 mg | ORAL_TABLET | ORAL | Status: AC
  Administered 2019-04-18: 12.5 mg via ORAL
  Filled 2019-04-18: qty 1

## 2019-04-18 NOTE — Progress Notes (Signed)
Progress Note  Patient Name: Jack Mejia Date of Encounter: 04/18/2019  Primary Cardiologist: [name], MD   Subjective   Was in [facility] and now feels tired mild chest tightness and SOB - nurse reports with just sitting in bed he was SOB.  After he sat for a few minutes his HR went from 141 to 125.    Inpatient Medications    Scheduled Meds: . apixaban  5 mg Oral BID  . diltiazem  360 mg Oral Daily  . glipiZIDE  10 mg Oral BID AC  . insulin aspart  0-15 Units Subcutaneous TID WC  . insulin aspart  0-5 Units Subcutaneous QHS  . metoprolol tartrate  12.5 mg Oral BID   Continuous Infusions:  PRN Meds: acetaminophen, ondansetron (ZOFRAN) IV   Vital Signs    Vitals:   04/17/19 1619 04/17/19 2141 04/18/19 0614 04/18/19 0615  BP: (!) 146/75 (!) 152/84 (!) 142/72 (!) 142/72  Pulse: 98 79 (!) 105 72  Resp: 19 16  18   Temp: 98.6 F (37 C) 98.6 F (37 C) 98.1 F (36.7 C) 98.1 F (36.7 C)  TempSrc: Oral Oral Oral Oral  SpO2: 94% 95%  95%  Weight:   96.6 kg   Height:        Intake/Output Summary (Last 24 hours) at 04/18/2019 0802 Last data filed at 04/18/2019 0754 Gross per 24 hour  Intake 718 ml  Output 1775 ml  Net -1057 ml   Last 3 Weights 04/18/2019 04/17/2019 04/16/2019  Weight (lbs) 212 lb 14.4 oz 215 lb 9.6 oz 217 lb 9.6 oz  Weight (kg) 96.571 kg 97.796 kg 98.703 kg      Telemetry    A flutter with RVR to 141 with ambulation. - Personally Reviewed  ECG    Ordered now - Personally Reviewed  Physical Exam   GGE:ZMOQ distress after getting up to BR with HR 141 and weak and SOB.   Neck: No JVD sitting up Cardiac: irreg irreg and rapid, no murmurs, rubs, or gallops.  Respiratory: Clear to auscultation bilaterally. No rales though mild diminished in bases GI: Soft, nontender, non-distended  MS: 1+ edema; No deformity. Neuro:  Nonfocal  Psych: Normal affect   Labs    High Sensitivity Troponin:   Recent Labs  Lab 04/15/19 1324 04/15/19 1538   TROPONINIHS 26* 32*      Chemistry Recent Labs  Lab 04/16/19 1559 04/17/19 0518 04/18/19 0354  NA 137 139 136  K 3.9 4.1 3.7  CL 100 103 100  CO2 25 24 23   GLUCOSE 305* 226* 187*  BUN 15 16 21   CREATININE 1.52* 1.62* 1.63*  CALCIUM 8.8* 9.4 8.8*  GFRNONAA 45* 42* 41*  GFRAA 52* 48* 48*  ANIONGAP 12 12 13      Hematology Recent Labs  Lab 04/15/19 1324 04/16/19 0330  WBC 5.5 4.5  RBC 3.35* 3.31*  HGB 11.9* 11.6*  HCT 36.3* 36.1*  MCV 108.4* 109.1*  MCH 35.5* 35.0*  MCHC 32.8 32.1  RDW 14.8 14.6  PLT 195 183    BNP Recent Labs  Lab 04/15/19 1324  BNP 236.1*     DDimer No results for input(s): DDIMER in the last 168 hours.   Radiology    No results found.  Cardiac Studies  Echo 02/22/19 1. The left ventricle has normal systolic function with an ejection fraction of 60-65%. The cavity size was normal. There is moderately increased left ventricular wall thickness. Left ventricular diastolic Doppler parameters are  indeterminate. 2. The right ventricle has normal systolic function. The cavity was normal. There is no increase in right ventricular wall thickness. 3. No evidence of mitral valve stenosis. 4. The aortic valve is tricuspid. Mild thickening of the aortic valve. Mild calcification of the aortic valve. No stenosis of the aortic valve. Mild aortic annular calcification noted. 5. The aorta is normal in size and structure. 6. The aortic root is normal in size and structure. 7. Pulmonary hypertension is indeterminate, inadequate TR jet.  Patient Profile     73 y.o. male PMH paroxysmal atrial fibrillation, chronic diastolic heart failure with LE edema, hypertension, CKD stage 3 who presented with worsening shortness of breath and tachycardia, admitted 04/15/19  Assessment & Plan    Acute on chronic diastolic heart failure, atrial fibrillation with RVR -evidence by effusion on CXR, elevated BNP, LE edema, rales on exam -back in SR early 04/16/19.  Stopped diltiazem drip, converted to equivalent oral (which is higher than his home dose) -with increased Cr lasix stopped -continue apixaban 5 mg BID -Neg 2553 and wt down from pk of 98.7 to 96.6 Kg. -04/17/19 pt went into a fib when I walked in room.  on dilt 360 and BB added metoprolol 12.5 BID  --will repeat pcxr --BP is elevated 154/93 possible to give 5 mg IV lopressor   Diabetes: hold metformin, AC/HS checks with SSI  today changed SSI to mod AC and MODERATE at HS  --still with elevated glucose.  Glipizide added back   Hypokalemia: repleted and now 3.7  CKD-3 now with increased Cr to 1.62 from 1.24 on admit though over last 2 months Cr has been 1.29 to 1.69 and today 1.63, lasix stopped  Hypo mg+ given 2 Gm yesterday will recheck. Back up to 1.8    For questions or updates, please contact [place] Please consult [email or web address] for contact info under        Signed, Cecilie Kicks, NP  04/18/2019, 8:02 AM

## 2019-04-18 NOTE — Care Management Important Message (Signed)
Important Message  Patient Details  Name: Jack Mejia MRN: [id number] Date of Birth: [birth year]   Medicare Important Message Given:  Yes     Shelda Altes 04/18/2019, 2:53 PM

## 2019-04-19 ENCOUNTER — Inpatient Hospital Stay (HOSPITAL_COMMUNITY): Payer: Medicare HMO

## 2019-04-19 LAB — BASIC METABOLIC PANEL
Anion gap: 11 (ref 5–15)
BUN: 23 mg/dL (ref 8–23)
CO2: 23 mmol/L (ref 22–32)
Calcium: 8.8 mg/dL — ABNORMAL LOW (ref 8.9–10.3)
Chloride: 102 mmol/L (ref 98–111)
Creatinine, Ser: 1.61 mg/dL — ABNORMAL HIGH (ref 0.61–1.24)
GFR calc Af Amer: 49 mL/min — ABNORMAL LOW (ref >60)
GFR calc non Af Amer: 42 mL/min — ABNORMAL LOW (ref >60)
Glucose, Bld: 176 mg/dL — ABNORMAL HIGH (ref 70–99)
Potassium: 3.6 mmol/L (ref 3.5–5.1)
Sodium: 136 mmol/L (ref 135–145)

## 2019-04-19 LAB — GLUCOSE, CAPILLARY
Glucose-Capillary: 223 mg/dL — ABNORMAL HIGH (ref 70–99)
Glucose-Capillary: 238 mg/dL — ABNORMAL HIGH (ref 70–99)
Glucose-Capillary: 336 mg/dL — ABNORMAL HIGH (ref 70–99)

## 2019-04-19 MED ORDER — AMIODARONE LOAD VIA INFUSION
150.0000 mg | Freq: Once | INTRAVENOUS | Status: AC
  Administered 2019-04-19: 150 mg via INTRAVENOUS
  Filled 2019-04-19: qty 83.34

## 2019-04-19 MED ORDER — AMIODARONE HCL IN DEXTROSE 360-4.14 MG/200ML-% IV SOLN
60.0000 mg/h | INTRAVENOUS | Status: DC
  Administered 2019-04-19 (×2): 60 mg/h via INTRAVENOUS
  Filled 2019-04-19 (×2): qty 200

## 2019-04-19 MED ORDER — AMIODARONE HCL IN DEXTROSE 360-4.14 MG/200ML-% IV SOLN
30.0000 mg/h | INTRAVENOUS | Status: DC
  Administered 2019-04-19 – 2019-04-21 (×3): 30 mg/h via INTRAVENOUS
  Filled 2019-04-19 (×3): qty 200

## 2019-04-19 NOTE — Progress Notes (Addendum)
Progress Note  Patient Name: Jack Mejia Date of Encounter: 04/19/2019  Primary Cardiologist: [name], MD   Subjective   Has been up in the room, gets coughing spells w/ this, otherwise, no SOB, no awareness of elevated HR  Inpatient Medications    Scheduled Meds: . apixaban  5 mg Oral BID  . diltiazem  360 mg Oral Daily  . glipiZIDE  10 mg Oral BID AC  . insulin aspart  0-15 Units Subcutaneous TID WC  . insulin aspart  0-5 Units Subcutaneous QHS  . metoprolol tartrate  25 mg Oral BID   Continuous Infusions:  PRN Meds: acetaminophen, ondansetron (ZOFRAN) IV   Vital Signs    Vitals:   04/18/19 1442 04/18/19 2033 04/18/19 2141 04/19/19 0615  BP: 140/70 (!) 150/72  (!) 141/83  Pulse: (!) 57 (!) 52 (!) 103 (!) 55  Resp: 18 16  18   Temp: 98.2 F (36.8 C) 98.5 F (36.9 C)  98.7 F (37.1 C)  TempSrc: Oral Oral  Oral  SpO2: 97% 94%  96%  Weight:    97.6 kg  Height:        Intake/Output Summary (Last 24 hours) at 04/19/2019 0836 Last data filed at 04/19/2019 8466 Gross per 24 hour  Intake 1044 ml  Output 1000 ml  Net 44 ml   Last 3 Weights 04/19/2019 04/18/2019 04/17/2019  Weight (lbs) 215 lb 2.7 oz 212 lb 14.4 oz 215 lb 9.6 oz  Weight (kg) 97.6 kg 96.571 kg 97.796 kg      Telemetry    Aflutter, RVR - Personally Reviewed  ECG    09/22 ECG is atrial flutter, HR 105 - Personally Reviewed  Physical Exam   General: Well developed, well nourished, male in no acute distress Head: Eyes PERRLA Normocephalic and atraumatic Lungs: Decreased BS bases. Heart: Irreg R&R, S1 S2, without rub or gallop. No murmur.  Pulses are 2+ & equal. Minimal JVD. Abdomen: Bowel sounds are present, abdomen soft and non-tender without masses or  hernias noted. Msk: Normal strength and tone for age. Extremities: No clubbing, cyanosis, trace LE edema.    Skin:  No rashes or lesions noted. Neuro: Alert and oriented X 3. Psych:  Good affect, responds appropriately   Labs     High Sensitivity Troponin:   Recent Labs  Lab 04/15/19 1324 04/15/19 1538  TROPONINIHS 26* 32*      Chemistry Recent Labs  Lab 04/17/19 0518 04/18/19 0354 04/19/19 0317  NA 139 136 136  K 4.1 3.7 3.6  CL 103 100 102  CO2 24 23 23   GLUCOSE 226* 187* 176*  BUN 16 21 23   CREATININE 1.62* 1.63* 1.61*  CALCIUM 9.4 8.8* 8.8*  GFRNONAA 42* 41* 42*  GFRAA 48* 48* 49*  ANIONGAP 12 13 11      Hematology Recent Labs  Lab 04/15/19 1324 04/16/19 0330  WBC 5.5 4.5  RBC 3.35* 3.31*  HGB 11.9* 11.6*  HCT 36.3* 36.1*  MCV 108.4* 109.1*  MCH 35.5* 35.0*  MCHC 32.8 32.1  RDW 14.8 14.6  PLT 195 183    BNP Recent Labs  Lab 04/15/19 1324  BNP 236.1*      Radiology    Dg Chest Port 1 View  Result Date: 04/18/2019 CLINICAL DATA:  Shortness of breath. EXAM: PORTABLE CHEST 1 VIEW COMPARISON:  04/15/2019. FINDINGS: Mediastinum hilar structures normal. Low lung volumes with mild basilar atelectasis. Improved aeration from prior exam. Interim near complete clearing of right pleural effusion. No  pneumothorax. Degenerative changes scoliosis thoracic spine. Degenerative changes both shoulders. IMPRESSION: 1. Low lung volumes with mild basilar atelectasis. Improved aeration from prior exam. Interim near complete clearing of right pleural effusion. 2.  Stable cardiomegaly.  No pulmonary venous congestion. Electronically Signed   By: Marcello Moores  Register   On: 04/18/2019 09:59    Cardiac Studies  Echo 02/22/19 1. The left ventricle has normal systolic function with an ejection fraction of 60-65%. The cavity size was normal. There is moderately increased left ventricular wall thickness. Left ventricular diastolic Doppler parameters are indeterminate. 2. The right ventricle has normal systolic function. The cavity was normal. There is no increase in right ventricular wall thickness. 3. No evidence of mitral valve stenosis. 4. The aortic valve is tricuspid. Mild thickening of the aortic  valve. Mild calcification of the aortic valve. No stenosis of the aortic valve. Mild aortic annular calcification noted. 5. The aorta is normal in size and structure. 6. The aortic root is normal in size and structure. 7. Pulmonary hypertension is indeterminate, inadequate TR jet.  Patient Profile     73 y.o. male PMH paroxysmal atrial fibrillation, chronic diastolic heart failure with LE edema, hypertension, CKD stage 3 who presented with worsening shortness of breath and tachycardia, admitted 04/15/19  Assessment & Plan    Acute on chronic diastolic heart failure - wt down 2 lbs from admit, I/O net -2.5 L - got 4 doses Lasix 40 mg IV>>d/c'd when Cr bumped - need to keep I/O even - ck O2 sats w/ ambulation, he may be at dry wt  Atrial fibrillation with RVR - dilt gtt>>oral at higher dose - metoprolol 12.5 mg bid added 09/21>>25 mg bid 09/22 - on Apixaban 5 mg bid - Rate control still poor, discuss w/ MD  Diabetes:  - metformin on hold - Home dose Glipizide restarted 09/21, on SSI  Hypokalemia:  - s/p supp and improved  CKD-3  - Cr 1.24 admit>>1.62>>stable - last dose Lasix 09/21  Hypomagnesemia  For questions or updates, please contact [facility] Please consult [email or web address] for contact info under   Signed, Rosaria Ferries, PA-C  04/19/2019, 8:36 AM

## 2019-04-19 NOTE — Progress Notes (Signed)
Pt having dry hacky cough, feels sob and has CP. Sat's 96% RA, put on 2L for comfort. CP immediately relieved with oxygen. Text paged Rosaria Ferries PA. Cont to monitor. Carroll Kinds RN

## 2019-04-20 LAB — GLUCOSE, CAPILLARY
Glucose-Capillary: 232 mg/dL — ABNORMAL HIGH (ref 70–99)
Glucose-Capillary: 290 mg/dL — ABNORMAL HIGH (ref 70–99)
Glucose-Capillary: 224 mg/dL — ABNORMAL HIGH (ref 70–99)
Glucose-Capillary: 207 mg/dL — ABNORMAL HIGH (ref 70–99)
Glucose-Capillary: 181 mg/dL — ABNORMAL HIGH (ref 70–99)

## 2019-04-20 LAB — BASIC METABOLIC PANEL
Anion gap: 12 (ref 5–15)
BUN: 25 mg/dL — ABNORMAL HIGH (ref 8–23)
CO2: 21 mmol/L — ABNORMAL LOW (ref 22–32)
Calcium: 8.9 mg/dL (ref 8.9–10.3)
Chloride: 101 mmol/L (ref 98–111)
Creatinine, Ser: 1.67 mg/dL — ABNORMAL HIGH (ref 0.61–1.24)
GFR calc Af Amer: 47 mL/min — ABNORMAL LOW (ref >60)
GFR calc non Af Amer: 40 mL/min — ABNORMAL LOW (ref >60)
Glucose, Bld: 230 mg/dL — ABNORMAL HIGH (ref 70–99)
Potassium: 3.7 mmol/L (ref 3.5–5.1)
Sodium: 134 mmol/L — ABNORMAL LOW (ref 135–145)

## 2019-04-20 MED ORDER — SODIUM CHLORIDE 0.9 % IV SOLN
INTRAVENOUS | Status: DC
  Administered 2019-04-21: 08:00:00 via INTRAVENOUS

## 2019-04-20 NOTE — Progress Notes (Addendum)
Progress Note  Patient Name: Jack Mejia Date of Encounter: 04/20/2019  Primary Cardiologist: [name], MD   Subjective   Breathing ok, no chest pain or palpitations  Inpatient Medications    Scheduled Meds: . apixaban  5 mg Oral BID  . diltiazem  360 mg Oral Daily  . glipiZIDE  10 mg Oral BID AC  . insulin aspart  0-15 Units Subcutaneous TID WC  . insulin aspart  0-5 Units Subcutaneous QHS  . metoprolol tartrate  25 mg Oral BID   Continuous Infusions: . amiodarone 30 mg/hr (04/19/19 2307)   PRN Meds: acetaminophen, ondansetron (ZOFRAN) IV   Vital Signs    Vitals:   04/19/19 2033 04/19/19 2354 04/20/19 0454 04/20/19 0502  BP: (!) 148/82 133/72 (!) 155/75   Pulse: 95 71 66   Resp: 16 18 18    Temp: 98.4 F (36.9 C) 97.8 F (36.6 C) 98.2 F (36.8 C)   TempSrc: Oral Oral Oral   SpO2: 93% 99% 97%   Weight:    97.2 kg  Height:        Intake/Output Summary (Last 24 hours) at 04/20/2019 0749 Last data filed at 04/20/2019 0734 Gross per 24 hour  Intake 480 ml  Output 900 ml  Net -420 ml   Last 3 Weights 04/20/2019 04/19/2019 04/18/2019  Weight (lbs) 214 lb 4.6 oz 215 lb 2.7 oz 212 lb 14.4 oz  Weight (kg) 97.2 kg 97.6 kg 96.571 kg      Telemetry    A flutter, HR approx 100 - Personally Reviewed  ECG    09/22 ECG is atrial flutter, HR 105 - Personally Reviewed  Physical Exam   General: Well developed, well nourished, male in no acute distress Head: Eyes PERRLA Normocephalic and atraumatic Lungs: Decreased BS bases. Heart: Irreg R&R S1 S2, without rub or gallop. No murmur.  Pulses are 2+ & equal. No JVD. Abdomen: Bowel sounds are present, abdomen soft and non-tender without masses or  hernias noted. Msk: Normal strength and tone for age. Extremities: No clubbing, cyanosis or edema.    Skin:  No rashes or lesions noted. Neuro: Alert and oriented X 3. Psych:  Good affect, responds appropriately  Labs    High Sensitivity Troponin:   Recent  Labs  Lab 04/15/19 1324 04/15/19 1538  TROPONINIHS 26* 32*      Chemistry Recent Labs  Lab 04/18/19 0354 04/19/19 0317 04/20/19 0330  NA 136 136 134*  K 3.7 3.6 3.7  CL 100 102 101  CO2 23 23 21*  GLUCOSE 187* 176* 230*  BUN 21 23 25*  CREATININE 1.63* 1.61* 1.67*  CALCIUM 8.8* 8.8* 8.9  GFRNONAA 41* 42* 40*  GFRAA 48* 49* 47*  ANIONGAP 13 11 12      Hematology Recent Labs  Lab 04/15/19 1324 04/16/19 0330  WBC 5.5 4.5  RBC 3.35* 3.31*  HGB 11.9* 11.6*  HCT 36.3* 36.1*  MCV 108.4* 109.1*  MCH 35.5* 35.0*  MCHC 32.8 32.1  RDW 14.8 14.6  PLT 195 183    BNP Recent Labs  Lab 04/15/19 1324  BNP 236.1*     Magnesium  Date Value Ref Range Status  04/17/2019 1.8 1.7 - 2.4 mg/dL Final    Comment:    Performed at [facility], [address], [place], [place] [address]     Radiology    Dg Chest 2 View  Result Date: 04/19/2019 CLINICAL DATA:  Cough, dyspnea on exertion. Pt states he no  longer has his dry cough and denies SOB and chest pains at this time. Pt is currently on oxygen. EXAM: CHEST - 2 VIEW COMPARISON:  Chest radiographs 04/18/2019, 04/15/2019 FINDINGS: Stable cardiomediastinal contours with enlarged heart size. Mild linear opacities at the right lung base may reflect atelectasis, early infiltrate not excluded. The left lung is clear. No pneumothorax or large pleural effusion. Chronic wedging of a lower thoracic vertebral body. IMPRESSION: Mild opacities at the right lung base could reflect atelectasis, early infiltrate not excluded. Electronically Signed   By: Audie Pinto M.D.   On: 04/19/2019 17:09   Dg Chest Port 1 View  Result Date: 04/18/2019 CLINICAL DATA:  Shortness of breath. EXAM: PORTABLE CHEST 1 VIEW COMPARISON:  04/15/2019. FINDINGS: Mediastinum hilar structures normal. Low lung volumes with mild basilar atelectasis. Improved aeration from prior exam. Interim near complete clearing of right pleural effusion. No pneumothorax.  Degenerative changes scoliosis thoracic spine. Degenerative changes both shoulders. IMPRESSION: 1. Low lung volumes with mild basilar atelectasis. Improved aeration from prior exam. Interim near complete clearing of right pleural effusion. 2.  Stable cardiomegaly.  No pulmonary venous congestion. Electronically Signed   By: Marcello Moores  Register   On: 04/18/2019 09:59    Cardiac Studies  Echo 02/22/19 1. The left ventricle has normal systolic function with an ejection fraction of 60-65%. The cavity size was normal. There is moderately increased left ventricular wall thickness. Left ventricular diastolic Doppler parameters are indeterminate. 2. The right ventricle has normal systolic function. The cavity was normal. There is no increase in right ventricular wall thickness. 3. No evidence of mitral valve stenosis. 4. The aortic valve is tricuspid. Mild thickening of the aortic valve. Mild calcification of the aortic valve. No stenosis of the aortic valve. Mild aortic annular calcification noted. 5. The aorta is normal in size and structure. 6. The aortic root is normal in size and structure. 7. Pulmonary hypertension is indeterminate, inadequate TR jet.  Patient Profile     73 y.o. male PMH paroxysmal atrial fibrillation, chronic diastolic heart failure with LE edema, hypertension, CKD stage 3 who presented with worsening shortness of breath and tachycardia, admitted 04/15/19  Assessment & Plan    Acute on chronic diastolic heart failure -  Wt down 3 lbs from admit - got IV Lasix 40 mg x 4 doses, d/c 09/21 after Cr went up - I/O net -2.9 L - pt ambulated 150 ft w/ no problem and O2 sats remained good - MD advise on daily vs prn Lasix  Atrial fibrillation with RVR - Dilt IV>>po w/ dose increase and BB added and up-titrated, but HR still not well controlled - amio added 09/23 - DCCV scheduled for 8 am 09/24 - no doses of Eliquis missed  Diabetes:  - on Glipizide and SSI - restart  metformin per MD  Hypokalemia:  - improved after supp, follow  CKD-3  - Cr 1.24 on admit>>1.62>>1.67 today, cause unclear - no intake charted for 7a-7p on 09/23  - continue to follow  Hypomagnesemia - improved  Plan: DCCV in am, MD advise if pt could be d/c'd later tomorrow  For questions or updates, please contact [facility] Please consult [email or web address] for contact info under   Signed, Rosaria Ferries, PA-C  04/20/2019, 7:49 AM

## 2019-04-20 NOTE — Progress Notes (Signed)
Inpatient Diabetes Program Recommendations  AACE/ADA: New Consensus Statement on Inpatient Glycemic Control (2015)  Target Ranges:  Prepandial:   less than 140 mg/dL      Peak postprandial:   less than 180 mg/dL (1-2 hours)      Critically ill patients:  140 - 180 mg/dL   Lab Results  Component Value Date   GLUCAP 290 (H) 04/20/2019   HGBA1C 9.0 (H) 02/22/2019    Review of Glycemic Control  Diabetes history: DM2 Outpatient Diabetes medications: metformin 1000 mg bid, glipizide 10 mg bid Current orders for Inpatient glycemic control: Novolog 0-15 units tidwc and 0-5 units QHS, glipizide 10 mg bid  HgbA1C - 9.0%  Inpatient Diabetes Program Recommendations:     Spoke with pt regarding his HgbA1C of 9.0%. Pt states he eats what he wants and "the [facility] will take me when it's my time." States he checks his blood sugars at home and they usually run under 200.   Needs to f/u with PCP for his diabetes management. It is doubtful he would be willing to go home on insulin.  Thank you. Lorenda Peck, RD, LDN, CDE Inpatient Diabetes Coordinator [phone]

## 2019-04-21 ENCOUNTER — Encounter (HOSPITAL_COMMUNITY)
Admission: EM | Disposition: A | Discharge status: Home / Self Care | Payer: Self-pay | Source: Home / Self Care | Attending: Cardiology

## 2019-04-21 ENCOUNTER — Inpatient Hospital Stay (HOSPITAL_COMMUNITY): Payer: Medicare HMO | Admitting: Certified Registered Nurse Anesthetist

## 2019-04-21 ENCOUNTER — Encounter (HOSPITAL_COMMUNITY): Payer: Self-pay | Admitting: Certified Registered Nurse Anesthetist

## 2019-04-21 ENCOUNTER — Inpatient Hospital Stay (HOSPITAL_COMMUNITY): Payer: Medicare HMO

## 2019-04-21 DIAGNOSIS — N183 Chronic kidney disease, stage 3 unspecified: Secondary | ICD-10-CM

## 2019-04-21 DIAGNOSIS — N179 Acute kidney failure, unspecified: Secondary | ICD-10-CM

## 2019-04-21 DIAGNOSIS — N189 Chronic kidney disease, unspecified: Secondary | ICD-10-CM

## 2019-04-21 HISTORY — PX: CARDIOVERSION: SHX1299

## 2019-04-21 LAB — BASIC METABOLIC PANEL
Anion gap: 10 (ref 5–15)
BUN: 27 mg/dL — ABNORMAL HIGH (ref 8–23)
CO2: 22 mmol/L (ref 22–32)
Calcium: 8.7 mg/dL — ABNORMAL LOW (ref 8.9–10.3)
Chloride: 101 mmol/L (ref 98–111)
Creatinine, Ser: 1.94 mg/dL — ABNORMAL HIGH (ref 0.61–1.24)
GFR calc Af Amer: 39 mL/min — ABNORMAL LOW (ref >60)
GFR calc non Af Amer: 34 mL/min — ABNORMAL LOW (ref >60)
Glucose, Bld: 250 mg/dL — ABNORMAL HIGH (ref 70–99)
Potassium: 3.7 mmol/L (ref 3.5–5.1)
Sodium: 133 mmol/L — ABNORMAL LOW (ref 135–145)

## 2019-04-21 LAB — GLUCOSE, CAPILLARY
Glucose-Capillary: 208 mg/dL — ABNORMAL HIGH (ref 70–99)
Glucose-Capillary: 221 mg/dL — ABNORMAL HIGH (ref 70–99)
Glucose-Capillary: 208 mg/dL — ABNORMAL HIGH (ref 70–99)

## 2019-04-21 SURGERY — CARDIOVERSION
Anesthesia: General

## 2019-04-21 MED ORDER — AMIODARONE HCL 200 MG PO TABS: 200.0000 mg | ORAL_TABLET | Freq: Every day | ORAL | Status: DC

## 2019-04-21 MED ORDER — AMIODARONE HCL 200 MG PO TABS
400.0000 mg | ORAL_TABLET | Freq: Every day | ORAL | Status: DC
  Administered 2019-04-21: 400 mg via ORAL
  Filled 2019-04-21: qty 2

## 2019-04-21 MED ORDER — LIDOCAINE 2% (20 MG/ML) 5 ML SYRINGE
INTRAMUSCULAR | Status: DC | PRN
  Administered 2019-04-21: 60 mg via INTRAVENOUS

## 2019-04-21 MED ORDER — METOPROLOL TARTRATE 25 MG PO TABS: 25.0000 mg | ORAL_TABLET | Freq: Two times a day (BID) | ORAL | 2 refills | Status: DC

## 2019-04-21 MED ORDER — AMIODARONE HCL 200 MG PO TABS: 200.0000 mg | ORAL_TABLET | Freq: Every day | ORAL | 2 refills | Status: DC

## 2019-04-21 MED ORDER — DILTIAZEM HCL ER COATED BEADS 360 MG PO CP24: 360.0000 mg | ORAL_CAPSULE | Freq: Every day | ORAL | 2 refills | Status: DC

## 2019-04-21 MED ORDER — PROPOFOL 10 MG/ML IV BOLUS
INTRAVENOUS | Status: DC | PRN
  Administered 2019-04-21: 100 mg via INTRAVENOUS

## 2019-04-21 MED ORDER — AMIODARONE HCL 400 MG PO TABS: 400.0000 mg | ORAL_TABLET | Freq: Every day | ORAL | 0 refills | Status: DC

## 2019-04-21 NOTE — Consult Note (Signed)
Reason for Consult: Acute kidney injury Referring Physician:  Dr. Debara Pickett  Chief Complaint:  Shortness of breath   Assessment/Plan: 1. Acute kidney injury on CKD III with BL Cr of 1.3-1.5 in the setting of afib and aflutter req DCCV. - I agree that he appears to be euvolemic. He may have had a minor insult from the persistent afib and aflutter and occasional events of relative hypotension but no e/o toxic insults from medications.  - I would let him equilibrate on his own without any diuretics given he appears relatively euvolemic. - We can follow up labs in a week and appt to follow if there is no improvement at [facility]. 2. Afib/ Aflutter - DCCV Fri and currently in NSR 3. dCHF 4. HTN 5. DM    HPI: Jack Mejia is an 73 y.o. male pAfib, dCHF, HTN, DM, CKD3 with BL Cr 1.3-1.5 admitted for shortness of breath with rapid afib or aflutter.  Diuretics started early in the hospitalization and  had been started on amiodarone.  Patient had been in afib, converted but then persistent aflutter with DCCV 9/25. According to the I&O's he's neg 3L net but his weight has only decreased from 98kg to 97.1 kg. Ultrasound does not reveal any e/o hydronephrosis.  Cr has fluctuated but then increased to 1.94 on day of consultation.  ROS Pertinent items are noted in HPI.  Chemistry and CBC: Creat  Date/Time Value Ref Range Status  02/21/2014 07:03 AM 1.16 0.50 - 1.35 mg/dL Final  07/07/2011 11:39 AM 0.94 0.50 - 1.35 mg/dL Final   Creatinine, Ser  Date/Time Value Ref Range Status  04/21/2019 03:50 AM 1.94 (H) 0.61 - 1.24 mg/dL Final  04/20/2019 03:30 AM 1.67 (H) 0.61 - 1.24 mg/dL Final  04/19/2019 03:17 AM 1.61 (H) 0.61 - 1.24 mg/dL Final  04/18/2019 03:54 AM 1.63 (H) 0.61 - 1.24 mg/dL Final  04/17/2019 05:18 AM 1.62 (H) 0.61 - 1.24 mg/dL Final  04/16/2019 03:59 PM 1.52 (H) 0.61 - 1.24 mg/dL Final  04/16/2019 03:30 AM 1.31 (H) 0.61 - 1.24 mg/dL Final  04/15/2019 01:24 PM 1.24 0.61 - 1.24 mg/dL Final   04/06/2019 01:50 PM 1.56 (H) 0.61 - 1.24 mg/dL Final  02/22/2019 05:14 AM 1.37 (H) 0.61 - 1.24 mg/dL Final  02/21/2019 04:03 PM 1.69 (H) 0.61 - 1.24 mg/dL Final  05/04/2018 10:56 AM 1.29 (H) 0.76 - 1.27 mg/dL Final  04/15/2017 05:57 PM 1.77 (H) 0.61 - 1.24 mg/dL Final  02/24/2017 08:03 AM 1.45 (H) 0.76 - 1.27 mg/dL Final  08/03/2016 03:30 PM 1.40 (H) 0.61 - 1.24 mg/dL Final  02/14/2016 08:13 AM 1.29 (H) 0.76 - 1.27 mg/dL Final  02/07/2015 08:03 AM 1.23 0.76 - 1.27 mg/dL Final  12/26/2014 11:43 AM 1.20 0.61 - 1.24 mg/dL Final  06/19/2014 02:23 AM 1.24 0.50 - 1.35 mg/dL Final  02/14/2013 03:24 AM 0.90 0.50 - 1.35 mg/dL Final   Recent Labs  Lab 04/16/19 0330 04/16/19 1559 04/17/19 0518 04/18/19 0354 04/19/19 0317 04/20/19 0330 04/21/19 0350  NA 137 137 139 136 136 134* 133*  K 3.3* 3.9 4.1 3.7 3.6 3.7 3.7  CL 100 100 103 100 102 101 101  CO2 24 25 24 23 23  21* 22  GLUCOSE 189* 305* 226* 187* 176* 230* 250*  BUN 12 15 16 21 23  25* 27*  CREATININE 1.31* 1.52* 1.62* 1.63* 1.61* 1.67* 1.94*  CALCIUM 9.1 8.8* 9.4 8.8* 8.8* 8.9 8.7*   Recent Labs  Lab 04/15/19 1324 04/16/19 0330  WBC 5.5 4.5  HGB 11.9* 11.6*  HCT 36.3* 36.1*  MCV 108.4* 109.1*  PLT 195 183   Liver Function Tests: No results for input(s): AST, ALT, ALKPHOS, BILITOT, PROT, ALBUMIN in the last 168 hours. No results for input(s): LIPASE, AMYLASE in the last 168 hours. No results for input(s): AMMONIA in the last 168 hours. Cardiac Enzymes: No results for input(s): CKTOTAL, CKMB, CKMBINDEX, TROPONINI in the last 168 hours. Iron Studies: No results for input(s): IRON, TIBC, TRANSFERRIN, FERRITIN in the last 72 hours. PT/INR: @LABRCNTIP (inr:5)  Xrays/Other Studies: ) Results for orders placed or performed during the hospital encounter of 04/15/19 (from the past 48 hour(s))  Glucose, capillary     Status: Abnormal   Collection Time: 04/19/19  5:04 PM  Result Value Ref Range   Glucose-Capillary 232 (H) 70 -  99 mg/dL   Comment 1 Notify RN    Comment 2 Document in Chart   Glucose, capillary     Status: Abnormal   Collection Time: 04/19/19  8:34 PM  Result Value Ref Range   Glucose-Capillary 336 (H) 70 - 99 mg/dL   Comment 1 Notify RN    Comment 2 Document in Chart   Basic metabolic panel     Status: Abnormal   Collection Time: 04/20/19  3:30 AM  Result Value Ref Range   Sodium 134 (L) 135 - 145 mmol/L   Potassium 3.7 3.5 - 5.1 mmol/L   Chloride 101 98 - 111 mmol/L   CO2 21 (L) 22 - 32 mmol/L   Glucose, Bld 230 (H) 70 - 99 mg/dL   BUN 25 (H) 8 - 23 mg/dL   Creatinine, Ser 1.67 (H) 0.61 - 1.24 mg/dL   Calcium 8.9 8.9 - 10.3 mg/dL   GFR calc non Af Amer 40 (L) >60 mL/min   GFR calc Af Amer 47 (L) >60 mL/min   Anion gap 12 5 - 15    Comment: Performed at [facility], [address] [place], [place] [address]  Glucose, capillary     Status: Abnormal   Collection Time: 04/20/19  7:36 AM  Result Value Ref Range   Glucose-Capillary 290 (H) 70 - 99 mg/dL   Comment 1 Notify RN    Comment 2 Document in Chart   Glucose, capillary     Status: Abnormal   Collection Time: 04/20/19 11:37 AM  Result Value Ref Range   Glucose-Capillary 224 (H) 70 - 99 mg/dL   Comment 1 Notify RN    Comment 2 Document in Chart   Glucose, capillary     Status: Abnormal   Collection Time: 04/20/19  3:58 PM  Result Value Ref Range   Glucose-Capillary 207 (H) 70 - 99 mg/dL   Comment 1 Notify RN    Comment 2 Document in Chart   Glucose, capillary     Status: Abnormal   Collection Time: 04/20/19  9:01 PM  Result Value Ref Range   Glucose-Capillary 181 (H) 70 - 99 mg/dL   Comment 1 Notify RN    Comment 2 Document in Chart   Basic metabolic panel     Status: Abnormal   Collection Time: 04/21/19  3:50 AM  Result Value Ref Range   Sodium 133 (L) 135 - 145 mmol/L   Potassium 3.7 3.5 - 5.1 mmol/L   Chloride 101 98 - 111 mmol/L   CO2 22 22 - 32 mmol/L   Glucose, Bld 250 (H) 70 - 99 mg/dL   BUN 27 (H) 8 -  23  mg/dL   Creatinine, Ser 1.94 (H) 0.61 - 1.24 mg/dL   Calcium 8.7 (L) 8.9 - 10.3 mg/dL   GFR calc non Af Amer 34 (L) >60 mL/min   GFR calc Af Amer 39 (L) >60 mL/min   Anion gap 10 5 - 15    Comment: Performed at [facility] [address], [place], [place] [address]  Glucose, capillary     Status: Abnormal   Collection Time: 04/21/19  9:13 AM  Result Value Ref Range   Glucose-Capillary 208 (H) 70 - 99 mg/dL  Glucose, capillary     Status: Abnormal   Collection Time: 04/21/19 10:58 AM  Result Value Ref Range   Glucose-Capillary 221 (H) 70 - 99 mg/dL   Dg Chest 2 View  Result Date: 04/19/2019 CLINICAL DATA:  Cough, dyspnea on exertion. Pt states he no longer has his dry cough and denies SOB and chest pains at this time. Pt is currently on oxygen. EXAM: CHEST - 2 VIEW COMPARISON:  Chest radiographs 04/18/2019, 04/15/2019 FINDINGS: Stable cardiomediastinal contours with enlarged heart size. Mild linear opacities at the right lung base may reflect atelectasis, early infiltrate not excluded. The left lung is clear. No pneumothorax or large pleural effusion. Chronic wedging of a lower thoracic vertebral body. IMPRESSION: Mild opacities at the right lung base could reflect atelectasis, early infiltrate not excluded. Electronically Signed   By: Audie Pinto M.D.   On: 04/19/2019 17:09   US Renal  Result Date: 04/21/2019 CLINICAL DATA:  Acute renal insufficiency EXAM: RENAL / URINARY TRACT ULTRASOUND COMPLETE COMPARISON:  None. FINDINGS: Right Kidney: Renal measurements: 10.0 x 5.0 x 5.3 cm = volume: 137.2 mL. Contains 2 cysts measuring 1.2 and 2.3 cm respectively. Left Kidney: Renal measurements: 13.3 x 6.9 x 6.5 cm = volume: 311.7 mL. Contains 2 cysts with the largest measuring 2.6 cm. Bladder: A 1.2 cm region of rounded increased echogenicity is seen in the bladder. The bladder is poorly distended with apparent wall thickening. IMPRESSION: 1. The kidneys both contains 2 cysts.  No  hydronephrosis. 2. There is a 1.2 cm echogenic region in the bladder which is nonspecific but could be due to a nonshadowing stone. The increased echogenicity would be less likely in a neoplasm. Recommend clinical correlation and follow-up as warranted. 3. Bladder wall thickening may be due to poor distention. Underlying cystitis not excluded. Electronically Signed   By: Dorise Bullion III M.D   On: 04/21/2019 13:22    PMH:   Past Medical History:  Diagnosis Date  . Arthritis   . Cramps of left lower extremity   . Depression   . Diabetes mellitus    type 2 for 7-8 yrs  . Dyspnea   . Elevated liver enzymes   . Fatty liver   . History of kidney stones   . Hyperlipidemia   . Hypertension   . Vertigo     PSH:   Past Surgical History:  Procedure Laterality Date  . CARPAL TUNNEL RELEASE     both wrist  . cataract surgery     bilateral  . CHOLECYSTECTOMY    . EYE SURGERY    . HEMORROIDECTOMY    . LUMBAR LAMINECTOMY/DECOMPRESSION MICRODISCECTOMY N/A 08/06/2016   Procedure: LUMBAR THREE- LUMBAR FIVE  DECOMPRESSIVE LUMBAR LAMINECTOMY;  Surgeon: Jovita Gamma, MD;  Location: [facility];  Service: Neurosurgery;  Laterality: N/A;    Allergies:  Allergies  Allergen Reactions  . Demerol Nausea And Vomiting  . Vasotec [Enalapril] Cough  Medications:   Prior to Admission medications   Medication Sig Start Date End Date Taking? Authorizing Provider  apixaban (ELIQUIS) 5 MG TABS tablet Take 1 tablet (5 mg total) by mouth 2 (two) times daily. 02/22/19  Yes Elgergawy, Silver Huguenin, MD  Cyanocobalamin (VITAMIN B 12 PO) Take 2,500 mcg by mouth daily.   Yes [provider]  diltiazem (CARDIZEM CD) 120 MG 24 hr capsule Take 1 capsule (120 mg total) by mouth 2 (two) times daily. 04/06/19  Yes Herminio Commons, MD  glipiZIDE (GLUCOTROL) 5 MG tablet TAKE 2 TABLETS BY MOUTH TWICE A DAY Patient taking differently: Take 10 mg by mouth 2 (two) times daily before a meal.  11/01/18  Yes [name], MD  hydrochlorothiazide (HYDRODIURIL) 25 MG tablet Take 1 tablet (25 mg total) by mouth daily. 03/15/19 06/13/19 Yes Strader, Fransisco Hertz, PA-C  metFORMIN (GLUCOPHAGE) 500 MG tablet TAKE 2 TABLETS BY MOUTH TWICE A DAY Patient taking differently: Take 1,000 mg by mouth 2 (two) times daily with a meal.  11/01/18  Yes [name], MD  Omega-3 Fatty Acids (OMEGA-3 FISH OIL PO) Take 1 capsule by mouth daily.    Yes [provider]    Discontinued Meds:   Medications Discontinued During This Encounter  Medication Reason  . doxycycline (VIBRA-TABS) 100 MG tablet Completed Course  . predniSONE (DELTASONE) 10 MG tablet Discontinued by provider  . diltiazem (CARDIZEM) 100 mg in dextrose 5% 133mL (1 mg/mL) infusion   . diltiazem (CARDIZEM CD) 24 hr capsule 300 mg   . insulin aspart (novoLOG) injection 0-9 Units   . insulin aspart (novoLOG) injection 0-15 Units   . diltiazem (CARDIZEM) 1 mg/mL load via infusion 10 mg   . furosemide (LASIX) injection 40 mg   . metoprolol tartrate (LOPRESSOR) tablet 12.5 mg   . 0.9 %  sodium chloride infusion Patient Transfer  . amiodarone (NEXTERONE PREMIX) 360-4.14 MG/200ML-% (1.8 mg/mL) IV infusion   . amiodarone (NEXTERONE PREMIX) 360-4.14 MG/200ML-% (1.8 mg/mL) IV infusion     Social History:  reports that he quit smoking about 36 years ago. His smoking use included cigarettes. He has never used smokeless tobacco. He reports that he does not drink alcohol or use drugs.  Family History:   Family History  Problem Relation Age of Onset  . Diabetes Mother   . Cancer Mother   . Diabetes Sister   . Diabetes Maternal Grandmother     Blood pressure (!) 157/78, pulse 73, temperature 97.7 F (36.5 C), temperature source Oral, resp. rate (!) 24, height 5\' 10"  (1.778 m), weight 97.1 kg, SpO2 93 %. General appearance: alert, cooperative and appears stated age Head: Normocephalic, without obvious abnormality, atraumatic Eyes: negative Neck: no  adenopathy, no carotid bruit, supple, symmetrical, trachea midline and thyroid not enlarged, symmetric, no tenderness/mass/nodules Back: symmetric, no curvature. ROM normal. No CVA tenderness. Resp: clear to auscultation bilaterally Cardio: S1, S2 normal GI: soft, non-tender; bowel sounds normal; no masses,  no organomegaly Extremities: extremities normal, atraumatic, no cyanosis or edema Pulses: 2+ and symmetric       Kaedance Magos, Hunt Oris, MD 04/21/2019, 1:47 PM

## 2019-04-21 NOTE — Progress Notes (Signed)
Inpatient Diabetes Program Recommendations  AACE/ADA: New Consensus Statement on Inpatient Glycemic Control (2015)  Target Ranges:  Prepandial:   less than 140 mg/dL      Peak postprandial:   less than 180 mg/dL (1-2 hours)      Critically ill patients:  140 - 180 mg/dL   Results for STEPAN, VERRETTE (MRN [id number]) as of 04/21/2019 09:41  Ref. Range 04/20/2019 07:36 04/20/2019 11:37 04/20/2019 15:58 04/20/2019 21:01  Glucose-Capillary Latest Ref Range: 70 - 99 mg/dL 290 (H)  8 units NOVOLOG  224 (H)  5 units NOVOLOG  207 (H)  5 units NOVOLOG  181 (H)   Results for DUONG, HAYDEL (MRN [id number]) as of 04/21/2019 09:41  Ref. Range 04/21/2019 09:13  Glucose-Capillary Latest Ref Range: 70 - 99 mg/dL 208 (H)      Home DM Meds: Glipizide 10 mg BID       Metformin 1000 mg BID  Current Orders: Novolog Moderate Correction Scale/ SSI (0-15 units) TID AC + HS     Glipizide 10 mg BID      Underwent Cardioversion this AM.   DM Coordinator talked with pt yesterday about his elevated A1c of 9% back from July and patient told DM Coordinator "the [facility] Cliche will take me when it's my time."      MD- Note AM CBGs have been running higher in the hospital.  Home dose Metformin on hold at this point.  Please consider adding low dose basal insulin to inpatient insulin regimen--  Lantus 9 units Daily (0.1 units/kg dosing) while home Metformin dose on hold     --Will follow patient during hospitalization--  Wyn Quaker RN, MSN, CDE Diabetes Coordinator Inpatient Glycemic Control Team Team Pager: [phone] (8a-5p)

## 2019-04-21 NOTE — Interval H&P Note (Signed)
History and Physical Interval Note:  04/21/2019 8:08 AM  Jack Mejia  has presented today for surgery, with the diagnosis of atrial fibrillation.  The various methods of treatment have been discussed with the patient and family. After consideration of risks, benefits and other options for treatment, the patient has consented to  Procedure(s): CARDIOVERSION (N/A) as a surgical intervention.  The patient's history has been reviewed, patient examined, no change in status, stable for surgery.  I have reviewed the patient's chart and labs.  Questions were answered to the patient's satisfaction.     Loyola Santino

## 2019-04-21 NOTE — Progress Notes (Signed)
Progress Note  Patient Name: Jack Mejia Date of Encounter: 04/21/2019  Primary Cardiologist: [name], MD   Subjective   Successful DCCV this morning.   Inpatient Medications    Scheduled Meds: . apixaban  5 mg Oral BID  . diltiazem  360 mg Oral Daily  . glipiZIDE  10 mg Oral BID AC  . insulin aspart  0-15 Units Subcutaneous TID WC  . insulin aspart  0-5 Units Subcutaneous QHS  . metoprolol tartrate  25 mg Oral BID   Continuous Infusions: . sodium chloride    . amiodarone 30 mg/hr (04/21/19 0033)   PRN Meds: acetaminophen, ondansetron (ZOFRAN) IV   Vital Signs    Vitals:   04/20/19 1258 04/20/19 2102 04/21/19 0551 04/21/19 0552  BP: 107/62 110/70  113/80  Pulse: 93   (!) 45  Resp: 20 20 18    Temp: 98.5 F (36.9 C) 98 F (36.7 C)  98.2 F (36.8 C)  TempSrc: Oral Oral  Oral  SpO2: 97% 97%  95%  Weight:    97.1 kg  Height:        Intake/Output Summary (Last 24 hours) at 04/21/2019 0639 Last data filed at 04/21/2019 7017 Gross per 24 hour  Intake 666.14 ml  Output 605 ml  Net 61.14 ml   Filed Weights   04/19/19 0615 04/20/19 0502 04/21/19 0552  Weight: 97.6 kg 97.2 kg 97.1 kg    Physical Exam   General appearance: alert and no distress Lungs: clear to auscultation bilaterally Heart: regular rate and rhythm Extremities: extremities normal, atraumatic, no cyanosis or edema Neurologic: Grossly normal   Labs    Chemistry Recent Labs  Lab 04/19/19 0317 04/20/19 0330 04/21/19 0350  NA 136 134* 133*  K 3.6 3.7 3.7  CL 102 101 101  CO2 23 21* 22  GLUCOSE 176* 230* 250*  BUN 23 25* 27*  CREATININE 1.61* 1.67* 1.94*  CALCIUM 8.8* 8.9 8.7*  GFRNONAA 42* 40* 34*  GFRAA 49* 47* 39*  ANIONGAP 11 12 10      Hematology Recent Labs  Lab 04/15/19 1324 04/16/19 0330  WBC 5.5 4.5  RBC 3.35* 3.31*  HGB 11.9* 11.6*  HCT 36.3* 36.1*  MCV 108.4* 109.1*  MCH 35.5* 35.0*  MCHC 32.8 32.1  RDW 14.8 14.6  PLT 195 183    Cardiac  EnzymesNo results for input(s): TROPONINI in the last 168 hours. No results for input(s): TROPIPOC in the last 168 hours.   BNP Recent Labs  Lab 04/15/19 1324  BNP 236.1*     DDimer No results for input(s): DDIMER in the last 168 hours.   Radiology    Dg Chest 2 View  Result Date: 04/19/2019 CLINICAL DATA:  Cough, dyspnea on exertion. Pt states he no longer has his dry cough and denies SOB and chest pains at this time. Pt is currently on oxygen. EXAM: CHEST - 2 VIEW COMPARISON:  Chest radiographs 04/18/2019, 04/15/2019 FINDINGS: Stable cardiomediastinal contours with enlarged heart size. Mild linear opacities at the right lung base may reflect atelectasis, early infiltrate not excluded. The left lung is clear. No pneumothorax or large pleural effusion. Chronic wedging of a lower thoracic vertebral body. IMPRESSION: Mild opacities at the right lung base could reflect atelectasis, early infiltrate not excluded. Electronically Signed   By: Audie Pinto M.D.   On: 04/19/2019 17:09   Telemetry    Sinus rhythm - Personally Reviewed  ECG    NSR at 70 - Personally Reviewed  Cardiac Studies   Echo 02/22/19 1. The left ventricle has normal systolic function with an ejection fraction of 60-65%. The cavity size was normal. There is moderately increased left ventricular wall thickness. Left ventricular diastolic Doppler parameters are indeterminate. 2. The right ventricle has normal systolic function. The cavity was normal. There is no increase in right ventricular wall thickness. 3. No evidence of mitral valve stenosis. 4. The aortic valve is tricuspid. Mild thickening of the aortic valve. Mild calcification of the aortic valve. No stenosis of the aortic valve. Mild aortic annular calcification noted. 5. The aorta is normal in size and structure. 6. The aortic root is normal in size and structure. 7. Pulmonary hypertension is indeterminate, inadequate TR jet.  Patient Profile      73 y.o. male with a hx of paroxysmal atrial fibrillation, chronic diastolic heart failure with LE edema, hypertension, CKD stage 3 who presented with worsening shortness of breath and tachycardia, admitted 04/15/19  Assessment & Plan    1.  Acute on chronic diastolic heart failure: -IV Lasix currently on hold -Weight, 214lb today>> admit weight 216lb -I&O, 2.9 L since admission  -DCCV today successful - now in sinus rhythm  2.  Atrial fibrillation with RVR: -Initially on IV diltiazem which is now been transitioned to p.o. with dose increase as well as beta-blocker with titration -Successful DCCV today -Switch IV to po amiodarone 400 mg daily x 1 week, then decrease to 200 mg daily   3.  DM2: -Continue SSI for glucose control while inpatient status  -Continue glipizide, metformin  -Follows with PCP   4.  Hypokalemia: -Improved, 3.7 today  -Continue supplementation   5.  CKD stage III: -Presenting creatinine 1.24>>> 1.62>>> 1.67>> 1.94 today (?baseline 1.2-1.4) - No clear offending medicines, no recent contrast administration, he is taking po - ?diuresis that may have lead to ATN - Has been net negative (even off diuretics), will check UA today and renal ultrasound -Ask nephrology to evaluate today  Will have to hold on d/c until rising creatinine further worked up  Pixie Casino, MD, [facility], [place] Director of the [employer] of the [employer] Board of Clinical Lipidology Attending Cardiologist  Direct Dial: [phone]  Fax: [phone]  Website:  [email or web address]  For questions or updates, please contact   Please consult [email or web address] for contact info under Cardiology/STEMI.

## 2019-04-21 NOTE — Anesthesia Preprocedure Evaluation (Addendum)
Anesthesia Evaluation  Patient identified by MRN, date of birth, ID band Patient awake    Reviewed: Allergy & Precautions, NPO status , Patient's Chart, lab work & pertinent test results  Airway Mallampati: III  TM Distance: >3 FB Neck ROM: Full    Dental  (+) Edentulous Upper, Edentulous Lower   Pulmonary former smoker,    Pulmonary exam normal breath sounds clear to auscultation       Cardiovascular hypertension, Pt. on medications + dysrhythmias Atrial Fibrillation  Rhythm:Irregular Rate:Tachycardia  ECG: rate 105. Atrial flutter with variable A-V block  ECHO: 1. The left ventricle has normal systolic function with an ejection fraction of 60-65%. The cavity size was normal. There is moderately increased left ventricular wall thickness. Left ventricular diastolic Doppler parameters are indeterminate.  2. The right ventricle has normal systolic function. The cavity was normal. There is no increase in right ventricular wall thickness.  3. No evidence of mitral valve stenosis.  4. The aortic valve is tricuspid. Mild thickening of the aortic valve. Mild calcification of the aortic valve. No stenosis of the aortic valve. Mild aortic annular calcification noted.  5. The aorta is normal in size and structure.  6. The aortic root is normal in size and structure.  7. Pulmonary hypertension is indeterminate, inadequate TR jet.   Neuro/Psych PSYCHIATRIC DISORDERS Depression Vertigo    GI/Hepatic negative GI ROS, Neg liver ROS,   Endo/Other  diabetes, Oral Hypoglycemic Agents  Renal/GU Renal disease     Musculoskeletal negative musculoskeletal ROS (+)   Abdominal (+) + obese,   Peds  Hematology  (+) anemia , HLD   Anesthesia Other Findings atrial fibrillation  Reproductive/Obstetrics                            Anesthesia Physical Anesthesia Plan  ASA: IV  Anesthesia Plan: General   Post-op Pain  Management:    Induction: Intravenous  PONV Risk Score and Plan: 2 and Propofol infusion and Treatment may vary due to age or medical condition  Airway Management Planned: Mask  Additional Equipment:   Intra-op Plan:   Post-operative Plan:   Informed Consent: I have reviewed the patients History and Physical, chart, labs and discussed the procedure including the risks, benefits and alternatives for the proposed anesthesia with the patient or authorized representative who has indicated his/her understanding and acceptance.     Dental advisory given  Plan Discussed with: CRNA  Anesthesia Plan Comments:        Anesthesia Quick Evaluation

## 2019-04-21 NOTE — Discharge Summary (Signed)
Discharge Summary    Patient ID: Jack Mejia MRN: [id number]; DOB: [birth year]  Admit date: 04/15/2019 Discharge date: 04/21/2019  Primary Care Provider: [name], MD  Primary Cardiologist: [name], MD   Discharge Diagnoses    Active Problems:   Hypertension   Diabetes mellitus [facility])   Atrial fibrillation with rapid ventricular response (HCC)   Acute on chronic diastolic (congestive) heart failure (HCC)   Acute-on-chronic kidney injury (HCC)  Allergies Allergies  Allergen Reactions   Demerol Nausea And Vomiting   Vasotec [Enalapril] Cough   Diagnostic Studies/Procedures    DCCV 04/21/2019: Cardioverted 1 time(s).  Cardioversion with synchronized biphasic 150J shock.  Evaluation: Findings: Post procedure EKG shows: NSR Complications: None Patient did tolerate procedure well.  History of Present Illness     Jack Mejia is a 73 y.o. male with a PMH paroxysmal atrial fibrillation, chronic diastolic heart failure with LE edema, hypertension, CKD stage 3 who presented 04/15/2019 with worsening shortness of breath and tachycardia.  He presented to [facility] on 04/15/2019 for the evaluation of ongoing and progressively worsening palpitations, generalized weakness, shortness of breath, and peripheral edema for at least 10 days.  He called his cardiologist on 04/06/2019 and was seen virtually for this complaint noting that his heart rates have ranged from 113 up to 162 bpm and his blood pressures were fairly well controlled.  He felt fatigued and was having some worsening shortness of breath and lower extremity edema at the time. His Lopressor was stopped and he was started on Cardizem 120 mg twice daily and was restarted on HCTZ 25 mg daily. On presentation, her reported that despite medication changes, he has had persistent symptoms.  He noted significant dyspnea on exertion and reported that he can "barely walk" due to his shortness of breath.  He also had c/o  of SS chest tightness and orthopnea sympotms. He was noted to be compliant with his Eliquis.   Hospital Course     On ED presentation, he was persistently hypertensive and found to be in A. fib with RVR on EKG. Cardizem bolus and drip was initiated with improvement. Chest x-ray showed small right pleural effusion with associated right basilar atelectasis. His BNP was  elevated as well. High-sensitivity troponin was mildly elevated with low suspicion of ACS/MI, felt more likely secondary to demand ischemia. He was admitted to Cardiology service for the management of acute on chronic diastolic heart failure, atrial fibrillation with RVR.   He was continued on diltiazem gtt in which his HR improved initially. He was also managed with IV diuretics with improvement in his acute SOB and LEE. Eliquis 5mg  BID was started. He converted to NSR by 04/16/2019 however quickly went back to AF on 04/17/2019. He had issues with AKI with a rising creatinine despite stopping IV diuretics. His Lasix was held for several days during his hospitalization. Low dose metoprolol was added to his regimen for greater rate control. His Metoprolol was also held during this time.   He continued to have issues with going in and out of atrial flutter and by 04/19/2019 he was persistently in AF and was symptomatic. DCCV was planned for 04/21/2019. He underwent a successful DCCV to NSR with one synchronized biphasic 150J shock.  The patient was seen by nephrology on day of discharge given an acute rise in creatinine from 1.67 to 1.94. He was thought to be euvolemic on exam. It was felt that his creatinine bump was due to minor insult from persistent  afib and aflutter and occasional events of relative hypotension but no e/o toxic insults from medications. Plan was to let him equilibrate on his own without any diuretics given he appears relatively euvolemic and follow closely in the OP setting with lab work.   Renal US ordered per Dr.  Debara Pickett that showed a 1.2 cm echogenic region in the bladder which is nonspecific but could be due to a nonshadowing stone. He has a prior hx of kidney stones and will therefore need to follow OP with his PCP and/or urologist.    Other hospital problems include:  1.  Acute on chronic diastolic heart failure: -Continue to hold diuretic per nephrology and recheck lab work prior to restart as above   -Weight, 214lb today>> admit weight 216lb -I&O, 2.9 L since admission  -DCCV 04/21/2019 successful to NSR   2.  Atrial fibrillation with RVR: -Initially on IV diltiazem which is now been transitioned to p.o. with dose increase as well as beta-blocker with titration -Successful DCCV 04/21/2019 -Switch IV to po amiodarone 400 mg daily x 1 week, then decrease to 200 mg daily   3.  DM2: -Continue glipizide, metformin  -Follow with PCP   4.  Hypokalemia: -Improved, 3.7 today  -Continue supplementation   5.  Acute on CKD stage III: -Presenting creatinine 1.24>>> 1.62>>> 1.67>> 1.94 today (baseline 1.2-1.4) -Questionable diuresis that may have lead to ATN -Has been net negative (even off diuretics), w -Renal ultrasound with a 1.2 cm echogenic region in the bladder which is nonspecific but could be due to a nonshadowing stone.  -Needs to follow with PCP/urology   Consultants: Nephrology   The patient was seen and examined by Dr. Debara Pickett who feels that the patient is stable and ready for discharge today, 04/21/2019.  _____________  Discharge Vitals Blood pressure (!) 157/78, pulse 73, temperature 97.7 F (36.5 C), temperature source Oral, resp. rate (!) 24, height 5\' 10"  (1.778 m), weight 97.1 kg, SpO2 93 %.  Filed Weights   04/20/19 0502 04/21/19 0552 04/21/19 0724  Weight: 97.2 kg 97.1 kg 97.1 kg    Labs & Radiologic Studies    Basic Metabolic Panel Recent Labs    04/20/19 0330 04/21/19 0350  NA 134* 133*  K 3.7 3.7  CL 101 101  CO2 21* 22  GLUCOSE 230* 250*  BUN 25* 27*   CREATININE 1.67* 1.94*  CALCIUM 8.9 8.7*    High Sensitivity Troponin:   Recent Labs  Lab 04/15/19 1324 04/15/19 1538  TROPONINIHS 26* 32*    _____________  Dg Chest 2 View  Result Date: 04/19/2019 CLINICAL DATA:  Cough, dyspnea on exertion. Pt states he no longer has his dry cough and denies SOB and chest pains at this time. Pt is currently on oxygen. EXAM: CHEST - 2 VIEW COMPARISON:  Chest radiographs 04/18/2019, 04/15/2019 FINDINGS: Stable cardiomediastinal contours with enlarged heart size. Mild linear opacities at the right lung base may reflect atelectasis, early infiltrate not excluded. The left lung is clear. No pneumothorax or large pleural effusion. Chronic wedging of a lower thoracic vertebral body. IMPRESSION: Mild opacities at the right lung base could reflect atelectasis, early infiltrate not excluded. Electronically Signed   By: Audie Pinto M.D.   On: 04/19/2019 17:09   US Renal  Result Date: 04/21/2019 CLINICAL DATA:  Acute renal insufficiency EXAM: RENAL / URINARY TRACT ULTRASOUND COMPLETE COMPARISON:  None. FINDINGS: Right Kidney: Renal measurements: 10.0 x 5.0 x 5.3 cm = volume: 137.2 mL. Contains 2 cysts measuring  1.2 and 2.3 cm respectively. Left Kidney: Renal measurements: 13.3 x 6.9 x 6.5 cm = volume: 311.7 mL. Contains 2 cysts with the largest measuring 2.6 cm. Bladder: A 1.2 cm region of rounded increased echogenicity is seen in the bladder. The bladder is poorly distended with apparent wall thickening. IMPRESSION: 1. The kidneys both contains 2 cysts.  No hydronephrosis. 2. There is a 1.2 cm echogenic region in the bladder which is nonspecific but could be due to a nonshadowing stone. The increased echogenicity would be less likely in a neoplasm. Recommend clinical correlation and follow-up as warranted. 3. Bladder wall thickening may be due to poor distention. Underlying cystitis not excluded. Electronically Signed   By: Dorise Bullion III M.D   On: 04/21/2019  13:22   Dg Chest Port 1 View  Result Date: 04/18/2019 CLINICAL DATA:  Shortness of breath. EXAM: PORTABLE CHEST 1 VIEW COMPARISON:  04/15/2019. FINDINGS: Mediastinum hilar structures normal. Low lung volumes with mild basilar atelectasis. Improved aeration from prior exam. Interim near complete clearing of right pleural effusion. No pneumothorax. Degenerative changes scoliosis thoracic spine. Degenerative changes both shoulders. IMPRESSION: 1. Low lung volumes with mild basilar atelectasis. Improved aeration from prior exam. Interim near complete clearing of right pleural effusion. 2.  Stable cardiomegaly.  No pulmonary venous congestion. Electronically Signed   By: Marcello Moores  Register   On: 04/18/2019 09:59   Dg Chest Portable 1 View  Result Date: 04/15/2019 CLINICAL DATA:  Chest pain, shortness of breath EXAM: PORTABLE CHEST 1 VIEW COMPARISON:  02/21/2019 FINDINGS: Stable cardiomediastinal contours. Small right pleural effusion with associated basilar atelectasis. No pneumothorax. Advanced degenerative changes of the shoulders. IMPRESSION: Small right pleural effusion with associated right basilar atelectasis. Electronically Signed   By: Davina Poke M.D.   On: 04/15/2019 13:47   Disposition   Pt is being discharged home today in good condition.  Follow-up Plans & Appointments    Follow-up Information    Herminio Commons, MD Follow up on 04/28/2019.   Specialty: Cardiology Why: Your follow up will be on 04/28/2019 at 11am. This will be a virtual visit with Dr. Bronson Ing. Their office will call you with the details about this appointment close to time.  Contact information: [address] [place] [address] [phone]          Discharge Instructions    Call MD for:  difficulty breathing, headache or visual disturbances   Complete by: As directed    Call MD for:  extreme fatigue   Complete by: As directed    Call MD for:  hives   Complete by: As directed    Call MD for:   persistant dizziness or light-headedness   Complete by: As directed    Call MD for:  persistant nausea and vomiting   Complete by: As directed    Call MD for:  redness, tenderness, or signs of infection (pain, swelling, redness, odor or green/yellow discharge around incision site)   Complete by: As directed    Call MD for:  severe uncontrolled pain   Complete by: As directed    Call MD for:  temperature >100.4   Complete by: As directed    Diet - low sodium heart healthy   Complete by: As directed    Discharge instructions   Complete by: As directed    Please take Amiodarone 400mg  tablet by mouth once daily through 04/28/2019. On 04/29/2019 you will take Amiodarone 200mg  by mouth once daily thereafter   Increase activity  slowly   Complete by: As directed      Discharge Medications   Allergies as of 04/21/2019      Reactions   Demerol Nausea And Vomiting   Vasotec [enalapril] Cough      Medication List    STOP taking these medications   hydrochlorothiazide 25 MG tablet Commonly known as: HYDRODIURIL     TAKE these medications   amiodarone 400 MG tablet Commonly known as: PACERONE Take 1 tablet (400 mg total) by mouth daily for 7 days. Start taking on: April 22, 2019   amiodarone 200 MG tablet Commonly known as: PACERONE Take 1 tablet (200 mg total) by mouth daily. Start taking on: April 29, 2019   apixaban 5 MG Tabs tablet Commonly known as: ELIQUIS Take 1 tablet (5 mg total) by mouth 2 (two) times daily.   diltiazem 360 MG 24 hr capsule Commonly known as: CARDIZEM CD Take 1 capsule (360 mg total) by mouth daily. Start taking on: April 22, 2019 What changed:   medication strength  how much to take  when to take this   glipiZIDE 5 MG tablet Commonly known as: GLUCOTROL TAKE 2 TABLETS BY MOUTH TWICE A DAY What changed: when to take this   metFORMIN 500 MG tablet Commonly known as: GLUCOPHAGE TAKE 2 TABLETS BY MOUTH TWICE A DAY What changed:  when to take this   metoprolol tartrate 25 MG tablet Commonly known as: LOPRESSOR Take 1 tablet (25 mg total) by mouth 2 (two) times daily.   OMEGA-3 FISH OIL PO Take 1 capsule by mouth daily.   VITAMIN B 12 PO Take 2,500 mcg by mouth daily.        Acute coronary syndrome (MI, NSTEMI, STEMI, etc) this admission?: No.    Outstanding Labs/Studies   BMET in one week after discharge   Duration of Discharge Encounter   Greater than 30 minutes including physician time.  Signed, Kathyrn Drown, NP 04/21/2019, 3:37 PM

## 2019-04-21 NOTE — Transfer of Care (Signed)
Immediate Anesthesia Transfer of Care Note  Patient: Jack Mejia  Procedure(s) Performed: CARDIOVERSION (N/A )  Patient Location: PACU and Endoscopy Unit  Anesthesia Type:General  Level of Consciousness: drowsy and patient cooperative  Airway & Oxygen Therapy: Patient Spontanous Breathing and Patient connected to nasal cannula oxygen  Post-op Assessment: Report given to RN and Post -op Vital signs reviewed and stable  Post vital signs: Reviewed and stable  Last Vitals:  Vitals Value Taken Time  BP    Temp    Pulse    Resp    SpO2      Last Pain:  Vitals:   04/21/19 0724  TempSrc: Oral  PainSc: 4       Patients Stated Pain Goal: 0 (84/13/24 4010)  Complications: No apparent anesthesia complications

## 2019-04-21 NOTE — Op Note (Signed)
Procedure: Electrical Cardioversion Indications:  Atrial Fibrillation  Procedure Details:  Consent: Risks of procedure as well as the alternatives and risks of each were explained to the (patient/caregiver).  Consent for procedure obtained.  Time Out: Verified patient identification, verified procedure, site/side was marked, verified correct patient position, special equipment/implants available, medications/allergies/relevent history reviewed, required imaging and test results available.  Performed  Patient placed on cardiac monitor, pulse oximetry, supplemental oxygen as necessary.  Sedation given: propofol 100 mg IV, Dr. Roanna Banning Pacer pads placed anterior and posterior chest.  Cardioverted 1 time(s).  Cardioversion with synchronized biphasic 150J shock.  Evaluation: Findings: Post procedure EKG shows: NSR Complications: None Patient did tolerate procedure well.  Time Spent Directly with the Patient:  30 minutes   Jack Mejia 04/21/2019, 8:22 AM

## 2019-04-21 NOTE — Anesthesia Postprocedure Evaluation (Signed)
Anesthesia Post Note  Patient: Jack Mejia  Procedure(s) Performed: CARDIOVERSION (N/A )     Patient location during evaluation: Endoscopy Anesthesia Type: General Level of consciousness: awake and alert Pain management: pain level controlled Vital Signs Assessment: post-procedure vital signs reviewed and stable Respiratory status: spontaneous breathing, nonlabored ventilation, respiratory function stable and patient connected to nasal cannula oxygen Cardiovascular status: blood pressure returned to baseline and stable Postop Assessment: no apparent nausea or vomiting Anesthetic complications: no    Last Vitals:  Vitals:   04/21/19 0832 04/21/19 1308  BP: 122/63 (!) 157/78  Pulse: 71 73  Resp: (!) 28 (!) 24  Temp:  36.5 C  SpO2: 96% 93%    Last Pain:  Vitals:   04/21/19 1308  TempSrc: Oral  PainSc:                  Ryan P Ellender

## 2019-04-21 NOTE — Anesthesia Procedure Notes (Signed)
Procedure Name: General with mask airway Date/Time: 04/21/2019 8:16 AM Performed by: Lowella Dell, CRNA Pre-anesthesia Checklist: Patient identified, Emergency Drugs available, Suction available, Patient being monitored and Timeout performed Patient Re-evaluated:Patient Re-evaluated prior to induction Oxygen Delivery Method: Ambu bag Preoxygenation: Pre-oxygenation with 100% oxygen Induction Type: IV induction Ventilation: Mask ventilation without difficulty Placement Confirmation: positive ETCO2 Dental Injury: Teeth and Oropharynx as per pre-operative assessment

## 2019-04-23 ENCOUNTER — Encounter (HOSPITAL_COMMUNITY): Payer: Self-pay | Admitting: Cardiovascular Disease

## 2019-04-26 ENCOUNTER — Telehealth: Payer: Self-pay | Admitting: *Deleted

## 2019-04-26 ENCOUNTER — Other Ambulatory Visit: Payer: Self-pay | Admitting: Family Medicine

## 2019-04-26 ENCOUNTER — Telehealth: Payer: Self-pay | Admitting: Cardiovascular Disease

## 2019-04-26 NOTE — Telephone Encounter (Signed)
I spoke with male who called .She states patient is very SOB, has a cough and has had elevated heart rates.She states his vital signs are "fine" now. I encouraged her to take him to the ED for evaluation.She is not sure if he will go or not.   I will FYI Dr.Koneswaran

## 2019-04-26 NOTE — Telephone Encounter (Signed)
correct 

## 2019-04-26 NOTE — Telephone Encounter (Signed)
Pt is having a hard time breathing, he's extremely short of breath, was admitted at [facility] last Saturday 9/19 and was d/c on 9/25, had DCCV on 9/25

## 2019-04-26 NOTE — Telephone Encounter (Signed)
Patient called with SOB today. Patient was recently in hospital with A Fib and had cardioversion 04/21/19. Consult with Dr Richardson Landry who recommended patient go to Er for evaluation. Patient verbalized understanding and will head to ER

## 2019-04-28 ENCOUNTER — Encounter: Payer: Self-pay | Admitting: Cardiovascular Disease

## 2019-04-28 ENCOUNTER — Telehealth (INDEPENDENT_AMBULATORY_CARE_PROVIDER_SITE_OTHER): Payer: Medicare HMO | Admitting: Cardiovascular Disease

## 2019-04-28 ENCOUNTER — Other Ambulatory Visit (HOSPITAL_COMMUNITY)
Admission: RE | Admit: 2019-04-28 | Discharge: 2019-04-28 | Disposition: A | Discharge status: Home / Self Care | Payer: Medicare HMO | Source: Ambulatory Visit | Attending: Cardiovascular Disease | Admitting: Cardiovascular Disease

## 2019-04-28 VITALS — BP 153/80 | HR 76 | Ht 70.0 in | Wt 215.0 lb

## 2019-04-28 DIAGNOSIS — R0602 Shortness of breath: Secondary | ICD-10-CM

## 2019-04-28 DIAGNOSIS — N183 Chronic kidney disease, stage 3 unspecified: Secondary | ICD-10-CM

## 2019-04-28 DIAGNOSIS — I5032 Chronic diastolic (congestive) heart failure: Secondary | ICD-10-CM

## 2019-04-28 DIAGNOSIS — I10 Essential (primary) hypertension: Secondary | ICD-10-CM

## 2019-04-28 DIAGNOSIS — I480 Paroxysmal atrial fibrillation: Secondary | ICD-10-CM

## 2019-04-28 LAB — BASIC METABOLIC PANEL
Anion gap: 9 (ref 5–15)
BUN: 19 mg/dL (ref 8–23)
CO2: 21 mmol/L — ABNORMAL LOW (ref 22–32)
Calcium: 8.7 mg/dL — ABNORMAL LOW (ref 8.9–10.3)
Chloride: 108 mmol/L (ref 98–111)
Creatinine, Ser: 1.45 mg/dL — ABNORMAL HIGH (ref 0.61–1.24)
GFR calc Af Amer: 55 mL/min — ABNORMAL LOW (ref >60)
GFR calc non Af Amer: 48 mL/min — ABNORMAL LOW (ref >60)
Glucose, Bld: 175 mg/dL — ABNORMAL HIGH (ref 70–99)
Potassium: 3.8 mmol/L (ref 3.5–5.1)
Sodium: 138 mmol/L (ref 135–145)

## 2019-04-28 NOTE — Progress Notes (Signed)
Virtual Visit via Telephone Note   This visit type was conducted due to national recommendations for restrictions regarding the COVID-19 Pandemic (e.g. social distancing) in an effort to limit this patient's exposure and mitigate transmission in our community.  Due to his co-morbid illnesses, this patient is at least at moderate risk for complications without adequate follow up.  This format is felt to be most appropriate for this patient at this time.  The patient did not have access to video technology/had technical difficulties with video requiring transitioning to audio format only (telephone).  All issues noted in this document were discussed and addressed.  No physical exam could be performed with this format.  Please refer to the patient's chart for his  consent to telehealth for Jack Mejia.   Date:  04/28/2019   ID:  PURCELL JUNGBLUTH, DOB 1946-07-26, MRN [id number]  Patient Location: Home Provider Location: Home  PCP:  [name], MD  Cardiologist:  [name], MD  Electrophysiologist:  None   Evaluation Performed:  Follow-Up Visit  Chief Complaint:  PAF  History of Present Illness:    Jack Mejia is a 73 y.o. male with paroxysmal atrial fibrillation, chronic diastolic heart failure with LE edema, hypertension, and CKD stage 3.  He was recently hospitalized for rapid atrial fibrillation and acute on chronic diastolic heart failure.  He was going in and out of atrial fibrillation and flutter.  He ultimately underwent successful DCCV to normal sinus rhythm with 1 synchronized biphasic shock of 150 J.  He was also evaluated by nephrology as creatinine acutely rose from 1.67-1.94.  Someone called our office on 04/26/2019 telling us that the patient was short of breath and had a cough with elevated heart rates.  He was encouraged to go to the ED for evaluation but did not.  He seldom has chest pains and palpitations. He is still short of breath and is very  frustrated.   The patient does not have symptoms concerning for COVID-19 infection (fever, chills, cough).    Past Medical History:  Diagnosis Date  . Arthritis   . Cramps of left lower extremity   . Depression   . Diabetes mellitus    type 2 for 7-8 yrs  . Dyspnea   . Elevated liver enzymes   . Fatty liver   . History of kidney stones   . Hyperlipidemia   . Hypertension   . Vertigo    Past Surgical History:  Procedure Laterality Date  . CARDIOVERSION N/A 04/21/2019   Procedure: CARDIOVERSION;  Surgeon: Sanda Klein, MD;  Location: [facility] ENDOSCOPY;  Service: Cardiovascular;  Laterality: N/A;  . CARPAL TUNNEL RELEASE     both wrist  . cataract surgery     bilateral  . CHOLECYSTECTOMY    . EYE SURGERY    . HEMORROIDECTOMY    . LUMBAR LAMINECTOMY/DECOMPRESSION MICRODISCECTOMY N/A 08/06/2016   Procedure: LUMBAR THREE- LUMBAR FIVE  DECOMPRESSIVE LUMBAR LAMINECTOMY;  Surgeon: Jovita Gamma, MD;  Location: [facility];  Service: Neurosurgery;  Laterality: N/A;     Current Meds  Medication Sig  . [START ON 04/29/2019] amiodarone (PACERONE) 200 MG tablet Take 1 tablet (200 mg total) by mouth daily.  [name] amiodarone (PACERONE) 400 MG tablet Take 1 tablet (400 mg total) by mouth daily for 7 days.  [name] apixaban (ELIQUIS) 5 MG TABS tablet Take 1 tablet (5 mg total) by mouth 2 (two) times daily.  . Cyanocobalamin (VITAMIN B 12 PO) Take 2,500 mcg by mouth  daily.  . diltiazem (CARDIZEM CD) 360 MG 24 hr capsule Take 1 capsule (360 mg total) by mouth daily.  [name] glipiZIDE (GLUCOTROL) 5 MG tablet TAKE 2 TABLETS BY MOUTH TWICE A DAY  . metFORMIN (GLUCOPHAGE) 500 MG tablet TAKE 2 TABLETS BY MOUTH TWICE A DAY  . metoprolol tartrate (LOPRESSOR) 25 MG tablet Take 1 tablet (25 mg total) by mouth 2 (two) times daily.  . Omega-3 Fatty Acids (OMEGA-3 FISH OIL PO) Take 1 capsule by mouth daily.      Allergies:   Demerol and Vasotec [enalapril]   Social History   Tobacco Use  . Smoking status: Former Smoker     Types: Cigarettes    Quit date: 10/14/1982    Years since quitting: 36.5  . Smokeless tobacco: Never Used  . Tobacco comment: 29 yrs ago  Substance Use Topics  . Alcohol use: No  . Drug use: No     Family Hx: The patient's family history includes Cancer in his mother; Diabetes in his maternal grandmother, mother, and sister.  ROS:   Please see the history of present illness.     All other systems reviewed and are negative.   Prior CV studies:   The following studies were reviewed today:  Echo 02/22/19 1. The left ventricle has normal systolic function with an ejection fraction of 60-65%. The cavity size was normal. There is moderately increased left ventricular wall thickness. Left ventricular diastolic Doppler parameters are indeterminate. 2. The right ventricle has normal systolic function. The cavity was normal. There is no increase in right ventricular wall thickness. 3. No evidence of mitral valve stenosis. 4. The aortic valve is tricuspid. Mild thickening of the aortic valve. Mild calcification of the aortic valve. No stenosis of the aortic valve. Mild aortic annular calcification noted. 5. The aorta is normal in size and structure. 6. The aortic root is normal in size and structure. 7. Pulmonary hypertension is indeterminate, inadequate TR jet.  Labs/Other Tests and Data Reviewed:    EKG:  No ECG reviewed.  Recent Labs: 02/21/2019: TSH 0.740 02/22/2019: ALT 22 04/15/2019: B Natriuretic Peptide 236.1 04/16/2019: Hemoglobin 11.6; Platelets 183 04/17/2019: Magnesium 1.8 04/21/2019: BUN 27; Creatinine, Ser 1.94; Potassium 3.7; Sodium 133   Recent Lipid Panel Lab Results  Component Value Date/Time   CHOL 173 05/04/2018 10:56 AM   TRIG 278 (H) 05/04/2018 10:56 AM   HDL 34 (L) 05/04/2018 10:56 AM   CHOLHDL 5.1 (H) 05/04/2018 10:56 AM   CHOLHDL 4.4 02/21/2014 07:03 AM   LDLCALC 83 05/04/2018 10:56 AM    Wt Readings from Last 3 Encounters:  04/28/19 215 lb  (97.5 kg)  04/21/19 214 lb (97.1 kg)  03/15/19 218 lb (98.9 kg)     Objective:    Vital Signs:  BP (!) 153/80   Pulse 76   Ht 5\' 10"  (1.778 m)   Wt 215 lb (97.5 kg)   BMI 30.85 kg/m    VITAL SIGNS:  reviewed  ASSESSMENT & PLAN:    1.  Paroxysmal atrial fibrillation: Underwent successful DCCV on 04/21/2019. HR is normal. Currently on amiodarone taper. Continue diltiazem 360 mg daily, apixaban, and metoprolol 25 mg bid.  2.  Chronic diastolic heart failure: Wt 215 lbs today. HCTZ on hold due to acute on chronic CKD stage 3.   3. Acute on chronic CKD stage 3: SCr 1.94 on 04/21/19. I will repeat BMET.  4. HTN: BP is elevated. This will need further monitoring. Will have to see when  HCTZ can be resumed.  5. Shortness of breath: CXR on 04/19/19 showed possible early infiltrate at right lung base vs atelectasis. Low risk nuclear test in Sept 2018. We spoke about obtaining a coronary CT angiogram to rule out obstructive CAD. He has agreed. Will check a BMET first.     COVID-19 Education: The signs and symptoms of COVID-19 were discussed with the patient and how to seek care for testing (follow up with PCP or arrange E-visit).  The importance of social distancing was discussed today.  Time:   Today, I have spent 25 minutes with the patient with telehealth technology discussing the above problems.     Medication Adjustments/Labs and Tests Ordered: Current medicines are reviewed at length with the patient today.  Concerns regarding medicines are outlined above.   Tests Ordered: No orders of the defined types were placed in this encounter.   Medication Changes: No orders of the defined types were placed in this encounter.   Follow Up:  In Person 1 month  Signed, [name], MD  04/28/2019 10:20 AM    [place]

## 2019-04-28 NOTE — Patient Instructions (Signed)
Medication Instructions: Your physician recommends that you continue on your current medications as directed. Please refer to the Current Medication list given to you today.   Labwork: BMET  Procedures/Testing: If BMET lab work ok, we need to schedule you for a coronary CT  Follow-Up:   In 1 month with the Physician Assistant after the CT (if you are able to have it done if lab work is ok)  Any Additional Special Instructions Will Be Listed Below (If Applicable).     If you need a refill on your cardiac medications before your next appointment, please call your pharmacy.     Thank you for choosing [facility] !

## 2019-04-28 NOTE — Addendum Note (Signed)
Addended by: Barbarann Ehlers A on: 04/28/2019 10:53 AM   Modules accepted: Orders

## 2019-05-01 ENCOUNTER — Telehealth: Payer: Self-pay

## 2019-05-01 DIAGNOSIS — I209 Angina pectoris, unspecified: Secondary | ICD-10-CM

## 2019-05-01 MED ORDER — METOPROLOL TARTRATE 100 MG PO TABS: ORAL_TABLET | ORAL | 0 refills | Status: DC

## 2019-05-01 NOTE — Telephone Encounter (Signed)
-----   Message from Herminio Commons, MD sent at 05/01/2019 10:04 AM EDT ----- Renal function has improved since 04/21/2019.

## 2019-05-01 NOTE — Telephone Encounter (Signed)
Jack Mejia daughter called the [place] office stating that he is having more shortness of breath.

## 2019-05-01 NOTE — Telephone Encounter (Signed)
I called patient and spoke with both he and his daughter.I told him to go to the ED if he felt that bad.Daughter asked if they went to ED could they get the cardiac ct sooner.I explained this test is not done urgently.He said he may go to the ED if he felt worse. He says he sleeps in a recliner at night and only sleeps for about an hour. He denies leg swelling, he does not weigh self at home.i have message Dr.[email or web address] regarding this.

## 2019-05-01 NOTE — Telephone Encounter (Signed)
Jack Commons, MD  Bernita Raisin, RN        If he is progressively getting worse, then yes, I would suggest to go to [facility]. He would need to be hospitalized overnight in order to get a cardiac CT done. They would only do chest CT/chest CT angiography in the ED.      I spoke with patient, he will call me in the am if he did not go to the ED

## 2019-05-01 NOTE — Telephone Encounter (Signed)
[id number] 

## 2019-05-02 ENCOUNTER — Telehealth (HOSPITAL_COMMUNITY): Payer: Self-pay | Admitting: Emergency Medicine

## 2019-05-02 NOTE — Telephone Encounter (Signed)
Reaching out to patient to offer assistance regarding upcoming cardiac imaging study; pt verbalizes understanding of appt date/time, parking situation and where to check in, pre-test NPO status and medications ordered, and verified current allergies; name and call back number provided for further questions should they arise Belford Pascucci RN Navigator Cardiac Imaging [facility] [phone] office [phone] cell 

## 2019-05-03 ENCOUNTER — Other Ambulatory Visit: Payer: Self-pay | Admitting: Cardiology

## 2019-05-04 ENCOUNTER — Ambulatory Visit (HOSPITAL_COMMUNITY)
Admission: RE | Admit: 2019-05-04 | Discharge: 2019-05-04 | Disposition: A | Discharge status: Home / Self Care | Payer: Medicare HMO | Source: Ambulatory Visit | Attending: Cardiovascular Disease | Admitting: Cardiovascular Disease

## 2019-05-04 ENCOUNTER — Telehealth: Payer: Self-pay

## 2019-05-04 ENCOUNTER — Other Ambulatory Visit: Payer: Self-pay

## 2019-05-04 ENCOUNTER — Encounter (HOSPITAL_COMMUNITY): Payer: Self-pay

## 2019-05-04 DIAGNOSIS — I739 Peripheral vascular disease, unspecified: Secondary | ICD-10-CM | POA: Diagnosis not present

## 2019-05-04 DIAGNOSIS — I209 Angina pectoris, unspecified: Secondary | ICD-10-CM | POA: Diagnosis not present

## 2019-05-04 DIAGNOSIS — Z79899 Other long term (current) drug therapy: Secondary | ICD-10-CM

## 2019-05-04 MED ORDER — NITROGLYCERIN 0.4 MG SL SUBL
0.8000 mg | SUBLINGUAL_TABLET | Freq: Once | SUBLINGUAL | Status: AC
  Administered 2019-05-04: 11:00:00 0.8 mg via SUBLINGUAL
  Filled 2019-05-04: qty 25

## 2019-05-04 MED ORDER — FUROSEMIDE 40 MG PO TABS: 40.0000 mg | ORAL_TABLET | Freq: Every day | ORAL | 3 refills | Status: DC

## 2019-05-04 MED ORDER — IOHEXOL 350 MG/ML SOLN
80.0000 mL | Freq: Once | INTRAVENOUS | Status: AC | PRN
  Administered 2019-05-04: 80 mL via INTRAVENOUS

## 2019-05-04 MED ORDER — NITROGLYCERIN 0.4 MG SL SUBL
SUBLINGUAL_TABLET | SUBLINGUAL | Status: AC
  Administered 2019-05-04: 0.8 mg via SUBLINGUAL
  Filled 2019-05-04: qty 2

## 2019-05-04 NOTE — Telephone Encounter (Signed)
Left message for patient and daughter to call me back, e-scribed lasix to pharmacy

## 2019-05-04 NOTE — Telephone Encounter (Signed)
-----   Message from Herminio Commons, MD sent at 05/04/2019 12:40 PM EDT ----- Significant fluid collections around the lungs.  Please forward a copy to PCP.  Start Lasix 40 mg daily.  Check a basic metabolic panel on 30/03/2329.

## 2019-05-04 NOTE — Telephone Encounter (Signed)
I spoke with daughter,Kymberly, she will pick up rx for lasix and tell her father to have BMET on Monday, 05/08/19.I faxed lab slip [facility]

## 2019-05-08 ENCOUNTER — Other Ambulatory Visit (HOSPITAL_COMMUNITY)
Admission: RE | Admit: 2019-05-08 | Discharge: 2019-05-08 | Disposition: A | Discharge status: Home / Self Care | Payer: Medicare HMO | Source: Ambulatory Visit | Attending: Cardiovascular Disease | Admitting: Cardiovascular Disease

## 2019-05-08 DIAGNOSIS — Z79899 Other long term (current) drug therapy: Secondary | ICD-10-CM | POA: Diagnosis not present

## 2019-05-08 LAB — BASIC METABOLIC PANEL
Anion gap: 14 (ref 5–15)
BUN: 22 mg/dL (ref 8–23)
CO2: 23 mmol/L (ref 22–32)
Calcium: 9.1 mg/dL (ref 8.9–10.3)
Chloride: 101 mmol/L (ref 98–111)
Creatinine, Ser: 1.71 mg/dL — ABNORMAL HIGH (ref 0.61–1.24)
GFR calc Af Amer: 45 mL/min — ABNORMAL LOW (ref >60)
GFR calc non Af Amer: 39 mL/min — ABNORMAL LOW (ref >60)
Glucose, Bld: 214 mg/dL — ABNORMAL HIGH (ref 70–99)
Potassium: 3.5 mmol/L (ref 3.5–5.1)
Sodium: 138 mmol/L (ref 135–145)

## 2019-05-09 DIAGNOSIS — I209 Angina pectoris, unspecified: Secondary | ICD-10-CM | POA: Diagnosis not present

## 2019-05-12 ENCOUNTER — Ambulatory Visit: Payer: Medicare HMO | Admitting: Cardiovascular Disease

## 2019-05-17 ENCOUNTER — Encounter: Payer: Self-pay | Admitting: Student

## 2019-05-17 ENCOUNTER — Ambulatory Visit: Payer: Medicare HMO | Admitting: Student

## 2019-05-17 ENCOUNTER — Other Ambulatory Visit: Payer: Self-pay

## 2019-05-17 VITALS — BP 156/77 | HR 84 | Temp 97.5°F | Ht 70.0 in | Wt 212.0 lb

## 2019-05-17 DIAGNOSIS — Z79899 Other long term (current) drug therapy: Secondary | ICD-10-CM

## 2019-05-17 DIAGNOSIS — R0600 Dyspnea, unspecified: Secondary | ICD-10-CM

## 2019-05-17 DIAGNOSIS — J90 Pleural effusion, not elsewhere classified: Secondary | ICD-10-CM | POA: Diagnosis not present

## 2019-05-17 DIAGNOSIS — I480 Paroxysmal atrial fibrillation: Secondary | ICD-10-CM | POA: Diagnosis not present

## 2019-05-17 DIAGNOSIS — N183 Chronic kidney disease, stage 3 unspecified: Secondary | ICD-10-CM | POA: Diagnosis not present

## 2019-05-17 DIAGNOSIS — R0609 Other forms of dyspnea: Secondary | ICD-10-CM

## 2019-05-17 DIAGNOSIS — I2510 Atherosclerotic heart disease of native coronary artery without angina pectoris: Secondary | ICD-10-CM

## 2019-05-17 DIAGNOSIS — R079 Chest pain, unspecified: Secondary | ICD-10-CM

## 2019-05-17 NOTE — Progress Notes (Addendum)
Cardiology Office Note    Date:  05/17/2019   ID:  Atwell, Mcdanel 1945/10/14, MRN [id number]  PCP:  [name], MD  Cardiologist: [name], MD    Chief Complaint  Patient presents with   Follow-up    Abnormal Coronary CT    History of Present Illness:    Jack Mejia is a 73 y.o. male with past medical history of paroxysmal atrial fibrillation (on Eliquis), HTN, HLD, Type 2 DM and Stage 3 CKD who presents to the office today to discuss a cardiac catheterization.  He most recently presented to [facility] on 04/[facility]/2019 for evaluation of worsening weakness, dyspnea and palpitations. Was found to be in atrial fibrillation with RVR upon admission and required IV Cardizem for rate control. He developed episodes of atrial flutter during admission as well and eventually required DCCV on 04/21/2019 with return to normal sinus rhythm. He was started on Amiodarone during admission and was discharged on 400 mg daily for 1 week then 200 mg daily. Was evaluated by Nephrology during admission given acute rise in creatinine (elevated at 1.94 at the time of discharge) but no changes were made to his medication regimen.   He had a follow-up telehealth visit with Dr. Bronson Ing on 04/28/2019 and reported having persistent dyspnea on exertion along with intermittent episodes of chest discomfort. Given his persistent symptoms, a Coronary CT was recommended for further evaluation to rule out obstructive CAD. Over read of the scan did show moderate right and small left pleural effusions and he was started on Lasix 40mg  daily. Follow-up BMET showed creatinine had increased from 1.45 to 1.71 but he was continued on his current dosing. Coronary CT did show significant CAD along the mid LAD and OM1, therefore the study was sent for FFR.  FFR was abnormal suggesting obstructive disease in the mid and distal LAD along with OM1, therefore a cardiac catheterization was recommended by Dr. Bronson Ing  for further evaluation.  In talking with the patient and his family friend today (accompanied patient due to him being hard of hearing), he reports still having dyspnea on exertion even after return to normal sinus rhythm. Says that he is barely able to walk to his trash can without having symptoms. He does report intermittent episodes of chest discomfort which can occur at rest or with activity and have overall been stable. He has not had to utilize NTG recently. Denies any specific orthopnea, PND or lower extremity edema.  He has not weighed himself regularly at home but weight has declined by 3 pounds since his last visit. He reports developing a rash after starting Lasix and says this started on his legs and then has gone along his arms. He has not tried any over-the-counter medications but says the itching waxes and wanes during the day. No new laundry detergent or soaps. He later told me in the conversation he had a rash prior to his hospitalization and prior to starting diuretic therapy but feels like his current rash is different.  He reports good compliance with Eliquis and has not missed any doses. No recent melena, hematochezia, or hematuria.    Past Medical History:  Diagnosis Date   Arthritis    Atrial fibrillation ([place])    a. s/p DCCV in 03/2019   Cramps of left lower extremity    Depression    Diabetes mellitus    type 2 for 7-8 yrs   Dyspnea    Elevated liver enzymes  Fatty liver    History of kidney stones    Hyperlipidemia    Hypertension    Vertigo     Past Surgical History:  Procedure Laterality Date   CARDIOVERSION N/A 04/21/2019   Procedure: CARDIOVERSION;  Surgeon: Sanda Klein, MD;  Location: [facility];  Service: Cardiovascular;  Laterality: N/A;   CARPAL TUNNEL RELEASE     both wrist   cataract surgery     bilateral   CHOLECYSTECTOMY     EYE SURGERY     HEMORROIDECTOMY     LUMBAR LAMINECTOMY/DECOMPRESSION MICRODISCECTOMY N/A  08/06/2016   Procedure: LUMBAR THREE- [place];  Surgeon: Jovita Gamma, MD;  Location: [facility];  Service: Neurosurgery;  Laterality: N/A;    Current Medications: Outpatient Medications Prior to Visit  Medication Sig Dispense Refill   amiodarone (PACERONE) 200 MG tablet Take 1 tablet (200 mg total) by mouth daily. 90 tablet 2   apixaban (ELIQUIS) 5 MG TABS tablet Take 1 tablet (5 mg total) by mouth 2 (two) times daily. 60 tablet 0   Cyanocobalamin (VITAMIN B 12 PO) Take 2,500 mcg by mouth daily.     diltiazem (CARDIZEM CD) 360 MG 24 hr capsule Take 1 capsule (360 mg total) by mouth daily. 60 capsule 2   furosemide (LASIX) 40 MG tablet Take 1 tablet (40 mg total) by mouth daily. (Patient taking differently: Take 40 mg by mouth daily. Hold) 90 tablet 3   glipiZIDE (GLUCOTROL) 5 MG tablet TAKE 2 TABLETS BY MOUTH TWICE A DAY 360 tablet 1   metFORMIN (GLUCOPHAGE) 500 MG tablet TAKE 2 TABLETS BY MOUTH TWICE A DAY 360 tablet 1   metoprolol tartrate (LOPRESSOR) 100 MG tablet Take 100 mg (1 tablet) 2 hours before cardiac CT,  DO NOT take your morning lopressor dose 1 tablet 0   metoprolol tartrate (LOPRESSOR) 25 MG tablet Take 1 tablet (25 mg total) by mouth 2 (two) times daily. 120 tablet 2   Omega-3 Fatty Acids (OMEGA-3 FISH OIL PO) Take 1 capsule by mouth daily.      amiodarone (PACERONE) 400 MG tablet Take 1 tablet (400 mg total) by mouth daily for 7 days. 7 tablet 0   No facility-administered medications prior to visit.      Allergies:   Demerol and Vasotec [enalapril]   Social History   Socioeconomic History   Marital status: Divorced    Spouse name: Not on file   Number of children: Not on file   Years of education: Not on file   Highest education level: Not on file  Occupational History   Not on file  Social Needs   Financial resource strain: Not hard at all   Food insecurity    Worry: Never true    Inability: Never true    Transportation needs    Medical: No    Non-medical: No  Tobacco Use   Smoking status: Former Smoker    Types: Cigarettes    Quit date: 10/14/1982    Years since quitting: 36.6   Smokeless tobacco: Never Used   Tobacco comment: 29 yrs ago  Substance and Sexual Activity   Alcohol use: No   Drug use: No   Sexual activity: Not Currently    Birth control/protection: None  Lifestyle   Physical activity    Days per week: 0 days    Minutes per session: 0 min   Stress: Not at all  Relationships   Social connections    Talks on phone:  More than three times a week    Gets together: More than three times a week    Attends religious service: Never    Active member of club or organization: No    Attends meetings of clubs or organizations: Never    Relationship status: Divorced  Other Topics Concern   Not on file  Social History Narrative   Not on file     Family History:  The patient's family history includes Cancer in his mother; Diabetes in his maternal grandmother, mother, and sister.   Review of Systems:   Please see the history of present illness.     General:  No chills, fever, night sweats or weight changes.  Cardiovascular:  No edema, orthopnea, palpitations, paroxysmal nocturnal dyspnea. Positive for chest pain and dyspnea on exertion.  Dermatological: No lesions/masses. Positive for rash.  Respiratory: No cough, dyspnea Urologic: No hematuria, dysuria Abdominal:   No nausea, vomiting, diarrhea, bright red blood per rectum, melena, or hematemesis Neurologic:  No visual changes, wkns, changes in mental status. All other systems reviewed and are otherwise negative except as noted above.   Physical Exam:    VS:  BP (!) 156/77    Pulse 84    Temp (!) 97.5 F (36.4 C)    Ht 5\' 10"  (1.778 m)    Wt 212 lb (96.2 kg)    SpO2 95%    BMI 30.42 kg/m    General: Well developed, well nourished,male appearing in no acute distress. Head: Normocephalic, atraumatic, sclera  non-icteric, no xanthomas, nares are without discharge.  Neck: No carotid bruits. JVD not elevated.  Lungs: Respirations regular and unlabored, without wheezes or rales.  Heart: Regular rate and rhythm. No S3 or S4.  No murmur, no rubs, or gallops appreciated. Abdomen: Soft, non-tender, non-distended with normoactive bowel sounds. No hepatomegaly. No rebound/guarding. No obvious abdominal masses. Msk:  Strength and tone appear normal for age. No joint deformities or effusions. Extremities: No clubbing or cyanosis. Trace ankle edema.  Distal pedal pulses are 2+ bilaterally. Maculopapular rash most notable along right calf with excoriations noted.  Neuro: Alert and oriented X 3. Moves all extremities spontaneously. No focal deficits noted. Psych:  Responds to questions appropriately with a normal affect. Skin: As above.   Wt Readings from Last 3 Encounters:  05/17/19 212 lb (96.2 kg)  04/28/19 215 lb (97.5 kg)  04/21/19 214 lb (97.1 kg)     Studies/Labs Reviewed:   EKG:  EKG is not ordered today.   Recent Labs: 02/21/2019: TSH 0.740 02/22/2019: ALT 22 04/15/2019: B Natriuretic Peptide 236.1 04/16/2019: Hemoglobin 11.6; Platelets 183 04/17/2019: Magnesium 1.8 05/08/2019: BUN 22; Creatinine, Ser 1.71; Potassium 3.5; Sodium 138   Lipid Panel    Component Value Date/Time   CHOL 173 05/04/2018 1056   TRIG 278 (H) 05/04/2018 1056   HDL 34 (L) 05/04/2018 1056   CHOLHDL 5.1 (H) 05/04/2018 1056   CHOLHDL 4.4 02/21/2014 0703   VLDL 38 02/21/2014 0703   LDLCALC 83 05/04/2018 1056    Additional studies/ records that were reviewed today include:   Echocardiogram: 01/2019 IMPRESSIONS    1. The left ventricle has normal systolic function with an ejection fraction of 60-65%. The cavity size was normal. There is moderately increased left ventricular wall thickness. Left ventricular diastolic Doppler parameters are indeterminate.  2. The right ventricle has normal systolic function. The  cavity was normal. There is no increase in right ventricular wall thickness.  3. No evidence of mitral valve  stenosis.  4. The aortic valve is tricuspid. Mild thickening of the aortic valve. Mild calcification of the aortic valve. No stenosis of the aortic valve. Mild aortic annular calcification noted.  5. The aorta is normal in size and structure.  6. The aortic root is normal in size and structure.  7. Pulmonary hypertension is indeterminate, inadequate TR jet.  Coronary CT: 05/04/2019 IMPRESSION: 1. Moderate right and small left pleural effusions lying dependently with associated passive subsegmental atelectasis in the lower lobes of the lungs bilaterally. 2. Mild paraseptal emphysema. 3. Aortic atherosclerosis. 4. Nonspecific borderline enlarged lymph nodes in mediastinal and hilar nodal stations, likely reactive.  IMPRESSION: 1. Calcium score 345 which is 63 rd percentile for age and sex  2.  Normal aortic root 2.8 cm  3. Significant CAD possibly obstructive in mid LAD and OM1 study sent for FFR CT  The best systolic and diastolic phases of the patients gated cardiac CTA sent to [facility] for hemodynamic analysis  FINDINGS: Abnormal FFR CT Distal PLB 0.81 Distal PDA 0.81 Mid LAD 0.86 and distal LAD 0.61 OM1 mid 0.83 and distal 0.69  IMPRESSION: Abnormal FFR CT suggesting obstructive disease in mid and distal LAD and OM1  Patient should be referred for heart catheterization   Assessment:    1. Dyspnea on exertion   2. Chest pain, exertional   3. Coronary artery calcification seen on CT scan   4. Paroxysmal atrial fibrillation (HCC)   5. Medication management   6. Pleural effusion   7. Stage 3 chronic kidney disease, unspecified whether stage 3a or 3b CKD      Plan:   In order of problems listed above:  1. Dyspnea on Exertion/ Exertional Chest Pain/Abnormal Coronary CT - It was initially thought his symptoms were secondary to his atrial fibrillation  with RVR but symptoms of chest pain and dyspnea on exertion have persisted even after return to normal sinus rhythm. Recent Coronary CT showed significant CAD along the mid LAD and OM1, therefore the study was sent for FFR. FFR was abnormal suggesting obstructive disease in the mid and distal LAD along with OM1 and a cardiac catheterization was recommended by Dr. Bronson Ing for further evaluation. - Risks and benefits of the procedure reviewed with the patient and he agrees to proceed. The patient understands that risks include but are not limited to stroke (1 in 1000), death (1 in 28), kidney failure [usually temporary] (1 in 500), bleeding (1 in 200), allergic reaction [possibly serious] (1 in 200).   He wants to have this performed soon as possible but given that his DCCV was on 04/21/2019, he at least needs to remain on uninterrupted anticoagulation until 05/21/2019. Will review with Dr. Bronson Ing to make sure Eliquis can be held 48 hours prior to his procedure. Will need COVID testing along with repeat BMET given rise in creatinine with diuretic therapy.  - Not on ASA given the need for anticoagulation. LDL was 91 in 12/2018. He is hesitant to start any new medications but based off of catheterization results, would recommend initiation of statin therapy. Currently on Fish Oil.  ADDENDUM: Reviewed with Dr. Bronson Ing. Patient can have cath on 05/23/2019 and hold Eliquis 48 hours prior to his procedure.   2. Persistent Atrial Fibrillation - He underwent DCCV on 04/21/2019 with return to normal sinus rhythm and is maintaining normal sinus rhythm by examination today. Remains on Amiodarone 200 mg daily along with Cardizem CD 360 mg daily and Lopressor 25 mg twice daily. -  he denies any evidence of active bleeding. Continue Eliquis 5mg  BID for anticoagulation.   3. Pleural Effusions - Noted on CT imaging as outlined above. He reports frequent urination with Lasix but has developed a rash and it is  unclear if this was present prior to his hospitalization and initiation of Amiodarone but he feels like this worsened after Lasix was started. He appears euvolemic by examination today, therefore I recommended he take a drug holiday from Lasix at this time to see if his rash improves.  4. Stage 3 CKD - creatinine peaked at 1.94 during recent admission but had improved to 1.45 on 04/28/2019. Following initiation of Lasix, this increased to 1.71. Will plan to obtain a repeat BMET this week. Will hold Lasix for now as outlined above.     Medication Adjustments/Labs and Tests Ordered: Current medicines are reviewed at length with the patient today.  Concerns regarding medicines are outlined above.  Medication changes, Labs and Tests ordered today are listed in the Patient Instructions below. Patient Instructions  Medication Instructions:  Your physician recommends that you continue on your current medications as directed. Please refer to the Current Medication list given to you today. Hold Lasix for now   *If you need a refill on your cardiac medications before your next appointment, please call your pharmacy*  Lab Work: Your physician recommends that you return for lab work in: 05/19/19  COVID screening on 05/19/19   If you have labs (blood work) drawn today and your tests are completely normal, you will receive your results only by:  [place] (if you have MyChart) OR  A paper copy in the mail If you have any lab test that is abnormal or we need to change your treatment, we will call you to review the results.  Testing/Procedures: Your physician has requested that you have a cardiac catheterization. Cardiac catheterization is used to diagnose and/or treat various heart conditions. Doctors may recommend this procedure for a number of different reasons. The most common reason is to evaluate chest pain. Chest pain can be a symptom of coronary artery disease (CAD), and cardiac  catheterization can show whether plaque is narrowing or blocking your hearts arteries. This procedure is also used to evaluate the valves, as well as measure the blood flow and oxygen levels in different parts of your heart. For further information please visit [facility]uge[facility]iesta[facility]tn. Please follow instruction sheet, as given.    Follow-Up: At [facility], you and your health needs are our priority.  As part of our continuing mission to provide you with exceptional heart care, we have created designated Provider Care Teams.  These Care Teams include your primary Cardiologist (physician) and Advanced Practice Providers (APPs -  Physician Assistants and Nurse Practitioners) who all work together to provide you with the care you need, when you need it.  Your next appointment:   Your physician recommends that you schedule a follow-up appointment in: after Catheterization    The format for your next appointment:   In Person  Provider:     Other Instructions Thank you for choosing [facility]!    Signed, Erma Heritage, PA-C  05/17/2019 6:55 PM    [place] [address] [place], [place] [address] Phone: [phone] Fax: [phone]

## 2019-05-17 NOTE — Patient Instructions (Signed)
Medication Instructions:  Your physician recommends that you continue on your current medications as directed. Please refer to the Current Medication list given to you today. Hold Lasix for now   *If you need a refill on your cardiac medications before your next appointment, please call your pharmacy*  Lab Work: Your physician recommends that you return for lab work in: 05/19/19  COVID screening on 05/19/19   If you have labs (blood work) drawn today and your tests are completely normal, you will receive your results only by: [name] MyChart Message (if you have MyChart) OR . A paper copy in the mail If you have any lab test that is abnormal or we need to change your treatment, we will call you to review the results.  Testing/Procedures: Your physician has requested that you have a cardiac catheterization. Cardiac catheterization is used to diagnose and/or treat various heart conditions. Doctors may recommend this procedure for a number of different reasons. The most common reason is to evaluate chest pain. Chest pain can be a symptom of coronary artery disease (CAD), and cardiac catheterization can show whether plaque is narrowing or blocking your heart's arteries. This procedure is also used to evaluate the valves, as well as measure the blood flow and oxygen levels in different parts of your heart. For further information please visit [facility]tn. Please follow instruction sheet, as given.    Follow-Up: At [facility], you and your health needs are our priority.  As part of our continuing mission to provide you with exceptional heart care, we have created designated Provider Care Teams.  These Care Teams include your primary Cardiologist (physician) and Advanced Practice Providers (APPs -  Physician Assistants and Nurse Practitioners) who all work together to provide you with the care you need, when you need it.  Your next appointment:   Your physician recommends that you schedule a  follow-up appointment in: after Catheterization    The format for your next appointment:   In Person  Provider:     Other Instructions Thank you for choosing [facility]!      [place] [address] Dept: [phone] Loc: [place]  05/17/2019  You are scheduled for a Cardiac Catheterization on Tuesday, October 27 with Dr. Daneen Schick.  1. Please arrive at the [facility] (Main Entrance A) at [facility]: [address] [place], [place] [address] at 7:00 AM (This time is two hours before your procedure to ensure your preparation). Free valet parking service is available.   Special note: Every effort is made to have your procedure done on time. Please understand that emergencies sometimes delay scheduled procedures.  2. Diet: Do not eat solid foods after midnight.  The patient may have clear liquids until 5am upon the day of the procedure.  3. Labs: You will need to have blood drawn on Friday, October 23 at [facility]. [address], [place]  Open: 7am - 6pm, Sat 8am - 12 noon   Phone: [phone]. You do not need to be fasting.  4. Medication instructions in preparation for your procedure:   Contrast Allergy: No  Stop taking Eliquis (Apixiban) on Wednesday, October 21.  Stop taking  Do not take Metformin the morning of your procedure.     On the morning of your procedure, take your Aspirin and any morning medicines NOT listed above.  You may use sips of  water.  5. Plan for one night stay--bring personal belongings. 6. Bring a current list of your medications and current insurance cards. 7. You MUST have a responsible person to drive you home. 8. Someone MUST be with you the first 24 hours after you arrive home or your discharge will be delayed. 9. Please wear clothes that are easy to get on and off and wear slip-on  shoes.  Thank you for allowing [place] to care for you!   -- [facility]

## 2019-05-17 NOTE — H&P (View-Only) (Signed)
Cardiology Office Note    Date:  05/17/2019   ID:  Daiwik, [place] 1946/04/14, MRN [id number]  PCP:  [name], MD  Cardiologist: [name], MD    Chief Complaint  Patient presents with   Follow-up    Abnormal Coronary CT    History of Present Illness:    Jack Mejia is a 73 y.o. male with past medical history of paroxysmal atrial fibrillation (on Eliquis), HTN, HLD, Type 2 DM and Stage 3 CKD who presents to the office today to discuss a cardiac catheterization.  He most recently presented to [facility] on 04/15/2019 for evaluation of worsening weakness, dyspnea and palpitations. Was found to be in atrial fibrillation with RVR upon admission and required IV Cardizem for rate control. He developed episodes of atrial flutter during admission as well and eventually required DCCV on 04/21/2019 with return to normal sinus rhythm. He was started on Amiodarone during admission and was discharged on 400 mg daily for 1 week then 200 mg daily. Was evaluated by Nephrology during admission given acute rise in creatinine (elevated at 1.94 at the time of discharge) but no changes were made to his medication regimen.   He had a follow-up telehealth visit with Dr. Bronson Ing on 04/28/2019 and reported having persistent dyspnea on exertion along with intermittent episodes of chest discomfort. Given his persistent symptoms, a Coronary CT was recommended for further evaluation to rule out obstructive CAD. Over read of the scan did show moderate right and small left pleural effusions and he was started on Lasix 40mg  daily. Follow-up BMET showed creatinine had increased from 1.45 to 1.71 but he was continued on his current dosing. Coronary CT did show significant CAD along the mid LAD and OM1, therefore the study was sent for FFR.  FFR was abnormal suggesting obstructive disease in the mid and distal LAD along with OM1, therefore a cardiac catheterization was recommended by Dr. Bronson Ing  for further evaluation.  In talking with the patient and his family friend today (accompanied patient due to him being hard of hearing), he reports still having dyspnea on exertion even after return to normal sinus rhythm. Says that he is barely able to walk to his trash can without having symptoms. He does report intermittent episodes of chest discomfort which can occur at rest or with activity and have overall been stable. He has not had to utilize NTG recently. Denies any specific orthopnea, PND or lower extremity edema.  He has not weighed himself regularly at home but weight has declined by 3 pounds since his last visit. He reports developing a rash after starting Lasix and says this started on his legs and then has gone along his arms. He has not tried any over-the-counter medications but says the itching waxes and wanes during the day. No new laundry detergent or soaps. He later told me in the conversation he had a rash prior to his hospitalization and prior to starting diuretic therapy but feels like his current rash is different.  He reports good compliance with Eliquis and has not missed any doses. No recent melena, hematochezia, or hematuria.    Past Medical History:  Diagnosis Date   Arthritis    Atrial fibrillation ([place])    a. s/p DCCV in 03/2019   Cramps of left lower extremity    Depression    Diabetes mellitus    type 2 for 7-8 yrs   Dyspnea    Elevated liver enzymes  Fatty liver    History of kidney stones    Hyperlipidemia    Hypertension    Vertigo     Past Surgical History:  Procedure Laterality Date   CARDIOVERSION N/A 04/21/2019   Procedure: CARDIOVERSION;  Surgeon: Sanda Klein, MD;  Location: [facility];  Service: Cardiovascular;  Laterality: N/A;   CARPAL TUNNEL RELEASE     both wrist   cataract surgery     bilateral   CHOLECYSTECTOMY     EYE SURGERY     HEMORROIDECTOMY     LUMBAR LAMINECTOMY/DECOMPRESSION MICRODISCECTOMY N/A  08/06/2016   Procedure: LUMBAR THREE- [place];  Surgeon: Jovita Gamma, MD;  Location: [facility] [place];  Service: Neurosurgery;  Laterality: N/A;    Current Medications: Outpatient Medications Prior to Visit  Medication Sig Dispense Refill   amiodarone (PACERONE) 200 MG tablet Take 1 tablet (200 mg total) by mouth daily. 90 tablet 2   apixaban (ELIQUIS) 5 MG TABS tablet Take 1 tablet (5 mg total) by mouth 2 (two) times daily. 60 tablet 0   Cyanocobalamin (VITAMIN B 12 PO) Take 2,500 mcg by mouth daily.     diltiazem (CARDIZEM CD) 360 MG 24 hr capsule Take 1 capsule (360 mg total) by mouth daily. 60 capsule 2   furosemide (LASIX) 40 MG tablet Take 1 tablet (40 mg total) by mouth daily. (Patient taking differently: Take 40 mg by mouth daily. Hold) 90 tablet 3   glipiZIDE (GLUCOTROL) 5 MG tablet TAKE 2 TABLETS BY MOUTH TWICE A DAY 360 tablet 1   metFORMIN (GLUCOPHAGE) 500 MG tablet TAKE 2 TABLETS BY MOUTH TWICE A DAY 360 tablet 1   metoprolol tartrate (LOPRESSOR) 100 MG tablet Take 100 mg (1 tablet) 2 hours before cardiac CT,  DO NOT take your morning lopressor dose 1 tablet 0   metoprolol tartrate (LOPRESSOR) 25 MG tablet Take 1 tablet (25 mg total) by mouth 2 (two) times daily. 120 tablet 2   Omega-3 Fatty Acids (OMEGA-3 FISH OIL PO) Take 1 capsule by mouth daily.      amiodarone (PACERONE) 400 MG tablet Take 1 tablet (400 mg total) by mouth daily for 7 days. 7 tablet 0   No facility-administered medications prior to visit.      Allergies:   Demerol and Vasotec [enalapril]   Social History   Socioeconomic History   Marital status: Divorced    Spouse name: Not on file   Number of children: Not on file   Years of education: Not on file   Highest education level: Not on file  Occupational History   Not on file  Social Needs   Financial resource strain: Not hard at all   Food insecurity    Worry: Never true    Inability: Never true    Transportation needs    Medical: No    Non-medical: No  Tobacco Use   Smoking status: Former Smoker    Types: Cigarettes    Quit date: 10/14/1982    Years since quitting: 36.6   Smokeless tobacco: Never Used   Tobacco comment: 29 yrs ago  Substance and Sexual Activity   Alcohol use: No   Drug use: No   Sexual activity: Not Currently    Birth control/protection: None  Lifestyle   Physical activity    Days per week: 0 days    Minutes per session: 0 min   Stress: Not at all  Relationships   Social connections    Talks on phone:  More than three times a week    Gets together: More than three times a week    Attends religious service: Never    Active member of club or organization: No    Attends meetings of clubs or organizations: Never    Relationship status: Divorced  Other Topics Concern   Not on file  Social History Narrative   Not on file     Family History:  The patient's family history includes Cancer in his mother; Diabetes in his maternal grandmother, mother, and sister.   Review of Systems:   Please see the history of present illness.     General:  No chills, fever, night sweats or weight changes.  Cardiovascular:  No edema, orthopnea, palpitations, paroxysmal nocturnal dyspnea. Positive for chest pain and dyspnea on exertion.  Dermatological: No lesions/masses. Positive for rash.  Respiratory: No cough, dyspnea Urologic: No hematuria, dysuria Abdominal:   No nausea, vomiting, diarrhea, bright red blood per rectum, melena, or hematemesis Neurologic:  No visual changes, wkns, changes in mental status. All other systems reviewed and are otherwise negative except as noted above.   Physical Exam:    VS:  BP (!) 156/77    Pulse 84    Temp (!) 97.5 F (36.4 C)    Ht 5\' 10"  (1.778 m)    Wt 212 lb (96.2 kg)    SpO2 95%    BMI 30.42 kg/m    General: Well developed, well nourished,male appearing in no acute distress. Head: Normocephalic, atraumatic, sclera  non-icteric, no xanthomas, nares are without discharge.  Neck: No carotid bruits. JVD not elevated.  Lungs: Respirations regular and unlabored, without wheezes or rales.  Heart: Regular rate and rhythm. No S3 or S4.  No murmur, no rubs, or gallops appreciated. Abdomen: Soft, non-tender, non-distended with normoactive bowel sounds. No hepatomegaly. No rebound/guarding. No obvious abdominal masses. Msk:  Strength and tone appear normal for age. No joint deformities or effusions. Extremities: No clubbing or cyanosis. Trace ankle edema.  Distal pedal pulses are 2+ bilaterally. Maculopapular rash most notable along right calf with excoriations noted.  Neuro: Alert and oriented X 3. Moves all extremities spontaneously. No focal deficits noted. Psych:  Responds to questions appropriately with a normal affect. Skin: As above.   Wt Readings from Last 3 Encounters:  05/17/19 212 lb (96.2 kg)  04/28/19 215 lb (97.5 kg)  04/21/19 214 lb (97.1 kg)     Studies/Labs Reviewed:   EKG:  EKG is not ordered today.   Recent Labs: 02/21/2019: TSH 0.740 02/22/2019: ALT 22 04/15/2019: B Natriuretic Peptide 236.1 04/16/2019: Hemoglobin 11.6; Platelets 183 04/17/2019: Magnesium 1.8 05/08/2019: BUN 22; Creatinine, Ser 1.71; Potassium 3.5; Sodium 138   Lipid Panel    Component Value Date/Time   CHOL 173 05/04/2018 1056   TRIG 278 (H) 05/04/2018 1056   HDL 34 (L) 05/04/2018 1056   CHOLHDL 5.1 (H) 05/04/2018 1056   CHOLHDL 4.4 02/21/2014 0703   VLDL 38 02/21/2014 0703   LDLCALC 83 05/04/2018 1056    Additional studies/ records that were reviewed today include:   Echocardiogram: 01/2019 IMPRESSIONS    1. The left ventricle has normal systolic function with an ejection fraction of 60-65%. The cavity size was normal. There is moderately increased left ventricular wall thickness. Left ventricular diastolic Doppler parameters are indeterminate.  2. The right ventricle has normal systolic function. The  cavity was normal. There is no increase in right ventricular wall thickness.  3. No evidence of mitral valve  stenosis.  4. The aortic valve is tricuspid. Mild thickening of the aortic valve. Mild calcification of the aortic valve. No stenosis of the aortic valve. Mild aortic annular calcification noted.  5. The aorta is normal in size and structure.  6. The aortic root is normal in size and structure.  7. Pulmonary hypertension is indeterminate, inadequate TR jet.  Coronary CT: 05/04/2019 IMPRESSION: 1. Moderate right and small left pleural effusions lying dependently with associated passive subsegmental atelectasis in the lower lobes of the lungs bilaterally. 2. Mild paraseptal emphysema. 3. Aortic atherosclerosis. 4. Nonspecific borderline enlarged lymph nodes in mediastinal and hilar nodal stations, likely reactive.  IMPRESSION: 1. Calcium score 345 which is 63 rd percentile for age and sex  2.  Normal aortic root 2.8 cm  3. Significant CAD possibly obstructive in mid LAD and OM1 study sent for FFR CT  The best systolic and diastolic phases of the patients gated cardiac CTA sent to [facility] for hemodynamic analysis  FINDINGS: Abnormal FFR CT Distal PLB 0.81 Distal PDA 0.81 Mid LAD 0.86 and distal LAD 0.61 OM1 mid 0.83 and distal 0.69  IMPRESSION: Abnormal FFR CT suggesting obstructive disease in mid and distal LAD and OM1  Patient should be referred for heart catheterization   Assessment:    1. Dyspnea on exertion   2. Chest pain, exertional   3. Coronary artery calcification seen on CT scan   4. Paroxysmal atrial fibrillation (HCC)   5. Medication management   6. Pleural effusion   7. Stage 3 chronic kidney disease, unspecified whether stage 3a or 3b CKD      Plan:   In order of problems listed above:  1. Dyspnea on Exertion/ Exertional Chest Pain/Abnormal Coronary CT - It was initially thought his symptoms were secondary to his atrial fibrillation  with RVR but symptoms of chest pain and dyspnea on exertion have persisted even after return to normal sinus rhythm. Recent Coronary CT showed significant CAD along the mid LAD and OM1, therefore the study was sent for FFR. FFR was abnormal suggesting obstructive disease in the mid and distal LAD along with OM1 and a cardiac catheterization was recommended by Dr. Bronson Ing for further evaluation. - Risks and benefits of the procedure reviewed with the patient and he agrees to proceed. The patient understands that risks include but are not limited to stroke (1 in 1000), death (1 in 3), kidney failure [usually temporary] (1 in 500), bleeding (1 in 200), allergic reaction [possibly serious] (1 in 200).   He wants to have this performed soon as possible but given that his DCCV was on 04/21/2019, he at least needs to remain on uninterrupted anticoagulation until 05/21/2019. Will review with Dr. Bronson Ing to make sure Eliquis can be held 48 hours prior to his procedure. Will need COVID testing along with repeat BMET given rise in creatinine with diuretic therapy.  - Not on ASA given the need for anticoagulation. LDL was 91 in 12/2018. He is hesitant to start any new medications but based off of catheterization results, would recommend initiation of statin therapy. Currently on Fish Oil.  ADDENDUM: Reviewed with Dr. Bronson Ing. Patient can have cath on 05/23/2019 and hold Eliquis 48 hours prior to his procedure.   2. Persistent Atrial Fibrillation - He underwent DCCV on 04/21/2019 with return to normal sinus rhythm and is maintaining normal sinus rhythm by examination today. Remains on Amiodarone 200 mg daily along with Cardizem CD 360 mg daily and Lopressor 25 mg twice daily. -  he denies any evidence of active bleeding. Continue Eliquis 5mg  BID for anticoagulation.   3. Pleural Effusions - Noted on CT imaging as outlined above. He reports frequent urination with Lasix but has developed a rash and it is  unclear if this was present prior to his hospitalization and initiation of Amiodarone but he feels like this worsened after Lasix was started. He appears euvolemic by examination today, therefore I recommended he take a drug holiday from Lasix at this time to see if his rash improves.  4. Stage 3 CKD - creatinine peaked at 1.94 during recent admission but had improved to 1.45 on 04/28/2019. Following initiation of Lasix, this increased to 1.71. Will plan to obtain a repeat BMET this week. Will hold Lasix for now as outlined above.     Medication Adjustments/Labs and Tests Ordered: Current medicines are reviewed at length with the patient today.  Concerns regarding medicines are outlined above.  Medication changes, Labs and Tests ordered today are listed in the Patient Instructions below. Patient Instructions  Medication Instructions:  Your physician recommends that you continue on your current medications as directed. Please refer to the Current Medication list given to you today. Hold Lasix for now   *If you need a refill on your cardiac medications before your next appointment, please call your pharmacy*  Lab Work: Your physician recommends that you return for lab work in: 05/19/19  COVID screening on 05/19/19   If you have labs (blood work) drawn today and your tests are completely normal, you will receive your results only by:  [place] (if you have MyChart) OR  A paper copy in the mail If you have any lab test that is abnormal or we need to change your treatment, we will call you to review the results.  Testing/Procedures: Your physician has requested that you have a cardiac catheterization. Cardiac catheterization is used to diagnose and/or treat various heart conditions. Doctors may recommend this procedure for a number of different reasons. The most common reason is to evaluate chest pain. Chest pain can be a symptom of coronary artery disease (CAD), and cardiac  catheterization can show whether plaque is narrowing or blocking your hearts arteries. This procedure is also used to evaluate the valves, as well as measure the blood flow and oxygen levels in different parts of your heart. For further information please visit [facility]. Please follow instruction sheet, as given.    Follow-Up: At [facility], you and your health needs are our priority.  As part of our continuing mission to provide you with exceptional heart care, we have created designated Provider Care Teams.  These Care Teams include your primary Cardiologist (physician) and Advanced Practice Providers (APPs -  Physician Assistants and Nurse Practitioners) who all work together to provide you with the care you need, when you need it.  Your next appointment:   Your physician recommends that you schedule a follow-up appointment in: after Catheterization    The format for your next appointment:   In Person  Provider:     Other Instructions Thank you for choosing [facility]!    Signed, Erma Heritage, PA-C  05/17/2019 6:55 PM    [place] [address] [place], [place] [address] Phone: [phone] Fax: [phone]

## 2019-05-18 ENCOUNTER — Encounter: Payer: Self-pay | Admitting: *Deleted

## 2019-05-19 ENCOUNTER — Other Ambulatory Visit: Payer: Self-pay

## 2019-05-19 ENCOUNTER — Other Ambulatory Visit (HOSPITAL_COMMUNITY)
Admission: RE | Admit: 2019-05-19 | Discharge: 2019-05-19 | Disposition: A | Discharge status: Home / Self Care | Payer: Medicare HMO | Source: Ambulatory Visit | Attending: Interventional Cardiology | Admitting: Interventional Cardiology

## 2019-05-19 ENCOUNTER — Other Ambulatory Visit (HOSPITAL_COMMUNITY)
Admission: RE | Admit: 2019-05-19 | Discharge: 2019-05-19 | Disposition: A | Discharge status: Home / Self Care | Payer: Medicare HMO | Source: Ambulatory Visit | Attending: Student | Admitting: Student

## 2019-05-19 DIAGNOSIS — Z01812 Encounter for preprocedural laboratory examination: Secondary | ICD-10-CM | POA: Diagnosis not present

## 2019-05-19 DIAGNOSIS — Z20828 Contact with and (suspected) exposure to other viral communicable diseases: Secondary | ICD-10-CM | POA: Diagnosis not present

## 2019-05-19 LAB — CBC WITH DIFFERENTIAL/PLATELET
Abs Immature Granulocytes: 0.05 10*3/uL (ref 0.00–0.07)
Basophils Absolute: 0 10*3/uL (ref 0.0–0.1)
Basophils Relative: 1 %
Eosinophils Absolute: 0.1 10*3/uL (ref 0.0–0.5)
Eosinophils Relative: 3 %
HCT: 32.6 % — ABNORMAL LOW (ref 39.0–52.0)
Hemoglobin: 10.6 g/dL — ABNORMAL LOW (ref 13.0–17.0)
Immature Granulocytes: 1 %
Lymphocytes Relative: 25 %
Lymphs Abs: 1 10*3/uL (ref 0.7–4.0)
MCH: 35.2 pg — ABNORMAL HIGH (ref 26.0–34.0)
MCHC: 32.5 g/dL (ref 30.0–36.0)
MCV: 108.3 fL — ABNORMAL HIGH (ref 80.0–100.0)
Monocytes Absolute: 0.9 10*3/uL (ref 0.1–1.0)
Monocytes Relative: 22 %
Neutro Abs: 2 10*3/uL (ref 1.7–7.7)
Neutrophils Relative %: 48 %
Platelets: 164 10*3/uL (ref 150–400)
RBC: 3.01 MIL/uL — ABNORMAL LOW (ref 4.22–5.81)
RDW: 14.3 % (ref 11.5–15.5)
WBC: 4.1 10*3/uL (ref 4.0–10.5)
nRBC: 0 % (ref 0.0–0.2)

## 2019-05-19 LAB — BASIC METABOLIC PANEL
Anion gap: 12 (ref 5–15)
BUN: 25 mg/dL — ABNORMAL HIGH (ref 8–23)
CO2: 22 mmol/L (ref 22–32)
Calcium: 8.6 mg/dL — ABNORMAL LOW (ref 8.9–10.3)
Chloride: 105 mmol/L (ref 98–111)
Creatinine, Ser: 1.62 mg/dL — ABNORMAL HIGH (ref 0.61–1.24)
GFR calc Af Amer: 48 mL/min — ABNORMAL LOW (ref >60)
GFR calc non Af Amer: 42 mL/min — ABNORMAL LOW (ref >60)
Glucose, Bld: 236 mg/dL — ABNORMAL HIGH (ref 70–99)
Potassium: 3.9 mmol/L (ref 3.5–5.1)
Sodium: 139 mmol/L (ref 135–145)

## 2019-05-19 LAB — SARS CORONAVIRUS 2 (TAT 6-24 HRS): SARS Coronavirus 2: NEGATIVE

## 2019-05-22 ENCOUNTER — Telehealth: Payer: Self-pay

## 2019-05-22 ENCOUNTER — Telehealth: Payer: Self-pay | Admitting: *Deleted

## 2019-05-22 NOTE — Telephone Encounter (Signed)
I am, awaiting call back from [place] at [facility] cath lab to determine when patient needs to go in for hydration.His cath is at 9 am , so they have to look at the schedule and call me back

## 2019-05-22 NOTE — Telephone Encounter (Signed)
Pt contacted pre-catheterization scheduled at [facility] for: Tuesday May 23, 2019 10:30 AM Verified arrival time and place: [facility]) at: 5:30 AM-pre procedure hydration   No solid food after midnight prior to cath, clear liquids until 5 AM day of procedure. Contrast allergy: no  Hold: Eliquis-none 05/21/19 until post procedure. Glipizide-AM of procedure Metformin-day of procedure and 48 hours post procedure.  Except hold medications AM meds can be  taken pre-cath with sip of water including: ASA 81 mg   Confirmed patient has responsible adult to drive home post procedure and observe 24 hours after arriving home: yes  Currently, due to Covid-19 pandemic, only one support person will be allowed with patient. Must be the same support person for that patient's entire stay, will be screened and required to wear a mask. They will be asked to wait in the waiting room for the duration of the patient's stay.  Patients are required to wear a mask when they enter the hospital.      COVID-19 Pre-Screening Questions:  . In the past 7 to 10 days have you had a cough,  shortness of breath, headache, congestion, fever (100 or greater) body aches, chills, sore throat, or sudden loss of taste or sense of smell? Shortness of breath for 3 months . Have you been around anyone with known Covid 19? no . Have you been around anyone who is awaiting Covid 19 test results in the past 7 to 10 days? no . Have you been around anyone who has been exposed to Covid 19, or has mentioned symptoms of Covid 19 within the past 7 to 10 days? no  I reviewed procedure/mask/visitor instructions, Covid-19 screening questions with patient, he verbalized understanding, thanked me for call.

## 2019-05-22 NOTE — Telephone Encounter (Signed)
-----   Message from Erma Heritage, [place] sent at 05/21/2019 10:01 AM EDT ----- Please let the patient know his platelet count remains stable. He is anemic but Hgb overall stable when compared to lab results over the past few months (recommend he have an anemia panel by PCP in the future). Kidney function has improved but remains above baseline. Will need to have pre-procedure hydration the morning of his catheterization to reduce the risk of contrast-induced nephropathy.

## 2019-05-22 NOTE — Telephone Encounter (Signed)
Moved to 10:30 am, pt to arrive at 5:30 am tomorrow for IV hydration per Anderson Malta. Pt aware

## 2019-05-22 NOTE — H&P (Signed)
Distal Cfx and LAD disease by [facility] should be med therapy.  CKD 3-4

## 2019-05-23 ENCOUNTER — Ambulatory Visit (HOSPITAL_COMMUNITY)
Admission: RE | Disposition: A | Discharge status: Home / Self Care | Payer: Self-pay | Source: Home / Self Care | Attending: Interventional Cardiology

## 2019-05-23 ENCOUNTER — Other Ambulatory Visit: Payer: Self-pay

## 2019-05-23 ENCOUNTER — Ambulatory Visit (HOSPITAL_COMMUNITY)
Admission: RE | Admit: 2019-05-23 | Discharge: 2019-05-23 | Disposition: A | Discharge status: Home / Self Care | Payer: Medicare HMO | Attending: Interventional Cardiology | Admitting: Interventional Cardiology

## 2019-05-23 DIAGNOSIS — Z79899 Other long term (current) drug therapy: Secondary | ICD-10-CM | POA: Insufficient documentation

## 2019-05-23 DIAGNOSIS — K760 Fatty (change of) liver, not elsewhere classified: Secondary | ICD-10-CM | POA: Insufficient documentation

## 2019-05-23 DIAGNOSIS — E785 Hyperlipidemia, unspecified: Secondary | ICD-10-CM | POA: Diagnosis present

## 2019-05-23 DIAGNOSIS — F320 Major depressive disorder, single episode, mild: Secondary | ICD-10-CM | POA: Diagnosis present

## 2019-05-23 DIAGNOSIS — I480 Paroxysmal atrial fibrillation: Secondary | ICD-10-CM | POA: Diagnosis not present

## 2019-05-23 DIAGNOSIS — M1990 Unspecified osteoarthritis, unspecified site: Secondary | ICD-10-CM | POA: Diagnosis not present

## 2019-05-23 DIAGNOSIS — Z7901 Long term (current) use of anticoagulants: Secondary | ICD-10-CM | POA: Insufficient documentation

## 2019-05-23 DIAGNOSIS — E1121 Type 2 diabetes mellitus with diabetic nephropathy: Secondary | ICD-10-CM

## 2019-05-23 DIAGNOSIS — Z833 Family history of diabetes mellitus: Secondary | ICD-10-CM | POA: Insufficient documentation

## 2019-05-23 DIAGNOSIS — I5033 Acute on chronic diastolic (congestive) heart failure: Secondary | ICD-10-CM | POA: Diagnosis present

## 2019-05-23 DIAGNOSIS — I25118 Atherosclerotic heart disease of native coronary artery with other forms of angina pectoris: Secondary | ICD-10-CM

## 2019-05-23 DIAGNOSIS — E1122 Type 2 diabetes mellitus with diabetic chronic kidney disease: Secondary | ICD-10-CM | POA: Diagnosis not present

## 2019-05-23 DIAGNOSIS — I130 Hypertensive heart and chronic kidney disease with heart failure and stage 1 through stage 4 chronic kidney disease, or unspecified chronic kidney disease: Secondary | ICD-10-CM | POA: Diagnosis not present

## 2019-05-23 DIAGNOSIS — Z7984 Long term (current) use of oral hypoglycemic drugs: Secondary | ICD-10-CM | POA: Insufficient documentation

## 2019-05-23 DIAGNOSIS — R0609 Other forms of dyspnea: Secondary | ICD-10-CM | POA: Diagnosis not present

## 2019-05-23 DIAGNOSIS — I5032 Chronic diastolic (congestive) heart failure: Secondary | ICD-10-CM | POA: Insufficient documentation

## 2019-05-23 DIAGNOSIS — Z885 Allergy status to narcotic agent status: Secondary | ICD-10-CM | POA: Diagnosis not present

## 2019-05-23 DIAGNOSIS — N183 Chronic kidney disease, stage 3 unspecified: Secondary | ICD-10-CM | POA: Diagnosis present

## 2019-05-23 DIAGNOSIS — Z87891 Personal history of nicotine dependence: Secondary | ICD-10-CM | POA: Insufficient documentation

## 2019-05-23 DIAGNOSIS — J90 Pleural effusion, not elsewhere classified: Secondary | ICD-10-CM | POA: Insufficient documentation

## 2019-05-23 DIAGNOSIS — I2510 Atherosclerotic heart disease of native coronary artery without angina pectoris: Secondary | ICD-10-CM

## 2019-05-23 DIAGNOSIS — E119 Type 2 diabetes mellitus without complications: Secondary | ICD-10-CM

## 2019-05-23 DIAGNOSIS — N179 Acute kidney failure, unspecified: Secondary | ICD-10-CM | POA: Diagnosis present

## 2019-05-23 DIAGNOSIS — N189 Chronic kidney disease, unspecified: Secondary | ICD-10-CM | POA: Diagnosis present

## 2019-05-23 HISTORY — PX: LEFT HEART CATH AND CORONARY ANGIOGRAPHY: CATH118249

## 2019-05-23 LAB — POCT I-STAT, CHEM 8
BUN: 25 mg/dL — ABNORMAL HIGH (ref 8–23)
Calcium, Ion: 1.13 mmol/L — ABNORMAL LOW (ref 1.15–1.40)
Chloride: 106 mmol/L (ref 98–111)
Creatinine, Ser: 1.6 mg/dL — ABNORMAL HIGH (ref 0.61–1.24)
Glucose, Bld: 175 mg/dL — ABNORMAL HIGH (ref 70–99)
HCT: 29 % — ABNORMAL LOW (ref 39.0–52.0)
Hemoglobin: 9.9 g/dL — ABNORMAL LOW (ref 13.0–17.0)
Potassium: 3.6 mmol/L (ref 3.5–5.1)
Sodium: 142 mmol/L (ref 135–145)
TCO2: 21 mmol/L — ABNORMAL LOW (ref 22–32)

## 2019-05-23 LAB — GLUCOSE, CAPILLARY: Glucose-Capillary: 162 mg/dL — ABNORMAL HIGH (ref 70–99)

## 2019-05-23 SURGERY — LEFT HEART CATH AND CORONARY ANGIOGRAPHY
Anesthesia: LOCAL

## 2019-05-23 MED ORDER — NITROGLYCERIN 1 MG/10 ML FOR IR/CATH LAB
INTRA_ARTERIAL | Status: DC | PRN
  Administered 2019-05-23: 100 ug via INTRA_ARTERIAL

## 2019-05-23 MED ORDER — SODIUM CHLORIDE 0.9% FLUSH: 3.0000 mL | Freq: Two times a day (BID) | INTRAVENOUS | Status: DC

## 2019-05-23 MED ORDER — SODIUM CHLORIDE 0.9 % IV SOLN: 250.0000 mL | INTRAVENOUS | Status: DC | PRN

## 2019-05-23 MED ORDER — ACETAMINOPHEN 325 MG PO TABS: 650.0000 mg | ORAL_TABLET | ORAL | Status: DC | PRN

## 2019-05-23 MED ORDER — FENTANYL CITRATE (PF) 100 MCG/2ML IJ SOLN
INTRAMUSCULAR | Status: AC
  Filled 2019-05-23: qty 2

## 2019-05-23 MED ORDER — VERAPAMIL HCL 2.5 MG/ML IV SOLN
INTRAVENOUS | Status: AC
  Filled 2019-05-23: qty 2

## 2019-05-23 MED ORDER — HEPARIN (PORCINE) IN NACL 1000-0.9 UT/500ML-% IV SOLN
INTRAVENOUS | Status: AC
  Filled 2019-05-23: qty 1000

## 2019-05-23 MED ORDER — SODIUM CHLORIDE 0.9 % WEIGHT BASED INFUSION: 1.0000 mL/kg/h | INTRAVENOUS | Status: DC

## 2019-05-23 MED ORDER — SODIUM CHLORIDE 0.9 % IV SOLN: INTRAVENOUS | Status: DC

## 2019-05-23 MED ORDER — LIDOCAINE HCL (PF) 1 % IJ SOLN
INTRAMUSCULAR | Status: AC
  Filled 2019-05-23: qty 30

## 2019-05-23 MED ORDER — SODIUM CHLORIDE 0.9% FLUSH: 3.0000 mL | INTRAVENOUS | Status: DC | PRN

## 2019-05-23 MED ORDER — ASPIRIN 81 MG PO CHEW: 81.0000 mg | CHEWABLE_TABLET | ORAL | Status: AC

## 2019-05-23 MED ORDER — NITROGLYCERIN 1 MG/10 ML FOR IR/CATH LAB
INTRA_ARTERIAL | Status: AC
  Filled 2019-05-23: qty 10

## 2019-05-23 MED ORDER — HEPARIN SODIUM (PORCINE) 1000 UNIT/ML IJ SOLN
INTRAMUSCULAR | Status: DC | PRN
  Administered 2019-05-23: 5000 [IU] via INTRAVENOUS

## 2019-05-23 MED ORDER — VERAPAMIL HCL 2.5 MG/ML IV SOLN
INTRAVENOUS | Status: DC | PRN
  Administered 2019-05-23: 10 mL via INTRA_ARTERIAL

## 2019-05-23 MED ORDER — HYDRALAZINE HCL 20 MG/ML IJ SOLN: 10.0000 mg | INTRAMUSCULAR | Status: DC | PRN

## 2019-05-23 MED ORDER — HEPARIN SODIUM (PORCINE) 1000 UNIT/ML IJ SOLN
INTRAMUSCULAR | Status: AC
  Filled 2019-05-23: qty 1

## 2019-05-23 MED ORDER — MIDAZOLAM HCL 2 MG/2ML IJ SOLN
INTRAMUSCULAR | Status: DC | PRN
  Administered 2019-05-23: 1 mg via INTRAVENOUS

## 2019-05-23 MED ORDER — SODIUM CHLORIDE 0.9 % WEIGHT BASED INFUSION
3.0000 mL/kg/h | INTRAVENOUS | Status: AC
  Administered 2019-05-23: 3 mL/kg/h via INTRAVENOUS

## 2019-05-23 MED ORDER — MIDAZOLAM HCL 2 MG/2ML IJ SOLN
INTRAMUSCULAR | Status: AC
  Filled 2019-05-23: qty 2

## 2019-05-23 MED ORDER — ONDANSETRON HCL 4 MG/2ML IJ SOLN: 4.0000 mg | Freq: Four times a day (QID) | INTRAMUSCULAR | Status: DC | PRN

## 2019-05-23 MED ORDER — FENTANYL CITRATE (PF) 100 MCG/2ML IJ SOLN
INTRAMUSCULAR | Status: DC | PRN
  Administered 2019-05-23: 25 ug via INTRAVENOUS

## 2019-05-23 MED ORDER — HEPARIN (PORCINE) IN NACL 1000-0.9 UT/500ML-% IV SOLN
INTRAVENOUS | Status: DC | PRN
  Administered 2019-05-23: 500 mL

## 2019-05-23 MED ORDER — IOHEXOL 350 MG/ML SOLN
INTRAVENOUS | Status: DC | PRN
  Administered 2019-05-23: 75 mL via INTRACARDIAC

## 2019-05-23 MED ORDER — NITROGLYCERIN 1 MG/10 ML FOR IR/CATH LAB
INTRA_ARTERIAL | Status: DC | PRN
  Administered 2019-05-23: 100 ug via INTRACORONARY

## 2019-05-23 MED ORDER — LABETALOL HCL 5 MG/ML IV SOLN: 10.0000 mg | INTRAVENOUS | Status: DC | PRN

## 2019-05-23 MED ORDER — LIDOCAINE HCL (PF) 1 % IJ SOLN
INTRAMUSCULAR | Status: DC | PRN
  Administered 2019-05-23: 2 mL via INTRADERMAL

## 2019-05-23 MED ORDER — OXYCODONE HCL 5 MG PO TABS: 5.0000 mg | ORAL_TABLET | ORAL | Status: DC | PRN

## 2019-05-23 SURGICAL SUPPLY — 10 items
CATH 5FR JL3.5 JR4 ANG PIG MP (CATHETERS) ×1 IMPLANT
DEVICE RAD COMP TR BAND LRG (VASCULAR PRODUCTS) ×1 IMPLANT
GLIDESHEATH SLEND A-KIT 6F 22G (SHEATH) ×1 IMPLANT
GUIDEWIRE INQWIRE 1.5J.035X260 (WIRE) IMPLANT
INQWIRE 1.5J .035X260CM (WIRE) ×2
KIT HEART LEFT (KITS) ×2 IMPLANT
PACK CARDIAC CATHETERIZATION (CUSTOM PROCEDURE TRAY) ×2 IMPLANT
SHEATH PROBE COVER 6X72 (BAG) ×1 IMPLANT
TRANSDUCER W/STOPCOCK (MISCELLANEOUS) ×2 IMPLANT
TUBING CIL FLEX 10 FLL-RA (TUBING) ×2 IMPLANT

## 2019-05-23 NOTE — Discharge Instructions (Signed)
Radial Site Care ° °This sheet gives you information about how to care for yourself after your procedure. Your health care provider may also give you more specific instructions. If you have problems or questions, contact your health care provider. °What can I expect after the procedure? °After the procedure, it is common to have: °· Bruising and tenderness at the catheter insertion area. °Follow these instructions at home: °Medicines °· Take over-the-counter and prescription medicines only as told by your health care provider. °Insertion site care °· Follow instructions from your health care provider about how to take care of your insertion site. Make sure you: °? Wash your hands with soap and water before you change your bandage (dressing). If soap and water are not available, use hand sanitizer. °? Change your dressing as told by your health care provider. °? Leave stitches (sutures), skin glue, or adhesive strips in place. These skin closures may need to stay in place for 2 weeks or longer. If adhesive strip edges start to loosen and curl up, you may trim the loose edges. Do not remove adhesive strips completely unless your health care provider tells you to do that. °· Check your insertion site every day for signs of infection. Check for: °? Redness, swelling, or pain. °? Fluid or blood. °? Pus or a bad smell. °? Warmth. °· Do not take baths, swim, or use a hot tub until your health care provider approves. °· You may shower 24-48 hours after the procedure, or as directed by your health care provider. °? Remove the dressing and gently wash the site with plain soap and water. °? Pat the area dry with a clean towel. °? Do not rub the site. That could cause bleeding. °· Do not apply powder or lotion to the site. °Activity ° °· For 24 hours after the procedure, or as directed by your health care provider: °? Do not flex or bend the affected arm. °? Do not push or pull heavy objects with the affected arm. °? Do not  drive yourself home from the hospital or clinic. You may drive 24 hours after the procedure unless your health care provider tells you not to. °? Do not operate machinery or power tools. °· Do not lift anything that is heavier than 10 lb (4.5 kg), or the limit that you are told, until your health care provider says that it is safe. °· Ask your health care provider when it is okay to: °? Return to work or school. °? Resume usual physical activities or sports. °? Resume sexual activity. °General instructions °· If the catheter site starts to bleed, raise your arm and put firm pressure on the site. If the bleeding does not stop, get help right away. This is a medical emergency. °· If you went home on the same day as your procedure, a responsible adult should be with you for the first 24 hours after you arrive home. °· Keep all follow-up visits as told by your health care provider. This is important. °Contact a health care provider if: °· You have a fever. °· You have redness, swelling, or yellow drainage around your insertion site. °Get help right away if: °· You have unusual pain at the radial site. °· The catheter insertion area swells very fast. °· The insertion area is bleeding, and the bleeding does not stop when you hold steady pressure on the area. °· Your arm or hand becomes pale, cool, tingly, or numb. °These symptoms may represent a serious problem   that is an emergency. Do not wait to see if the symptoms will go away. Get medical help right away. Call your local emergency services (911 in the [place] Do not drive yourself to the hospital. °Summary °· After the procedure, it is common to have bruising and tenderness at the site. °· Follow instructions from your health care provider about how to take care of your radial site wound. Check the wound every day for signs of infection. °· Do not lift anything that is heavier than 10 lb (4.5 kg), or the limit that you are told, until your health care provider says  that it is safe. °This information is not intended to replace advice given to you by your health care provider. Make sure you discuss any questions you have with your health care provider. °Document Released: 08/15/2010 Document Revised: 08/18/2017 Document Reviewed: 08/18/2017 °Elsevier Patient Education © 2020 [employer] ° °

## 2019-05-23 NOTE — CV Procedure (Signed)
   Right radial using real-time vascular ultrasound for guidance.  Distal LAD territory, first obtuse marginal, and PDA significant stenoses.  No significant proximal vessel disease.  Left ventricular injection not performed.  LVEDP 26 mmHg after several hours of hydration.  Previously documented EF greater than 55%.  Medical therapy for distal vessel significant CAD.  Medical therapy for diastolic heart failure.

## 2019-05-23 NOTE — Progress Notes (Signed)
Discharge instructions reviewed with pt and Ann voices understanding.

## 2019-05-23 NOTE — Interval H&P Note (Signed)
Cath Lab Visit (complete for each Cath Lab visit)  Clinical Evaluation Leading to the Procedure:   ACS: No.  Non-ACS:    Anginal Classification: CCS III  Anti-ischemic medical therapy: Minimal Therapy (1 class of medications)  Non-Invasive Test Results: No non-invasive testing performed  Prior CABG: No previous CABG      History and Physical Interval Note:  05/23/2019 10:01 AM  Jack Mejia  has presented today for surgery, with the diagnosis of abnormal CT.  The various methods of treatment have been discussed with the patient and family. After consideration of risks, benefits and other options for treatment, the patient has consented to  Procedure(s): LEFT HEART CATH AND CORONARY ANGIOGRAPHY (N/A) as a surgical intervention.  The patient's history has been reviewed, patient examined, no change in status, stable for surgery.  I have reviewed the patient's chart and labs.  Questions were answered to the patient's satisfaction.     Belva Crome III

## 2019-05-23 NOTE — Interval H&P Note (Signed)
Cath Lab Visit (complete for each Cath Lab visit)  Clinical Evaluation Leading to the Procedure:   ACS: No.  Non-ACS:    Anginal Classification: CCS III  Anti-ischemic medical therapy: Minimal Therapy (1 class of medications)  Non-Invasive Test Results: Intermediate-risk stress test findings: cardiac mortality 1-3%/year  Prior CABG: No previous CABG      History and Physical Interval Note:  05/23/2019 9:43 AM  Jack Mejia  has presented today for surgery, with the diagnosis of abnormal CT.  The various methods of treatment have been discussed with the patient and family. After consideration of risks, benefits and other options for treatment, the patient has consented to  Procedure(s): LEFT HEART CATH AND CORONARY ANGIOGRAPHY (N/A) as a surgical intervention.  The patient's history has been reviewed, patient examined, no change in status, stable for surgery.  I have reviewed the patient's chart and labs.  Questions were answered to the patient's satisfaction.     Belva Crome III

## 2019-05-24 ENCOUNTER — Telehealth: Payer: Self-pay | Admitting: Cardiovascular Disease

## 2019-05-24 ENCOUNTER — Encounter (HOSPITAL_COMMUNITY): Payer: Self-pay | Admitting: Interventional Cardiology

## 2019-05-24 DIAGNOSIS — R079 Chest pain, unspecified: Secondary | ICD-10-CM

## 2019-05-24 DIAGNOSIS — Z79899 Other long term (current) drug therapy: Secondary | ICD-10-CM

## 2019-05-24 MED ORDER — TORSEMIDE 20 MG PO TABS: 20.0000 mg | ORAL_TABLET | Freq: Every day | ORAL | Status: DC

## 2019-05-24 MED FILL — Heparin Sod (Porcine)-NaCl IV Soln 1000 Unit/500ML-0.9%: INTRAVENOUS | Qty: 500 | Status: AC

## 2019-05-24 NOTE — Telephone Encounter (Signed)
Returned pt call. Jack Mejia states that since stopping lasix the pt's rash has almost went away. Per the doctor that performed Cath yesterday, pt needs to be on a fluid pill, but is wanting a different one. Please advise.

## 2019-05-24 NOTE — Telephone Encounter (Signed)
PT's wife made aware. RX sent. LAbs slips mailed. APPT MADE.

## 2019-05-24 NOTE — Telephone Encounter (Signed)
Please give [name] a call @ [phone] concerning medication after the pt's cath yesterday

## 2019-05-24 NOTE — Telephone Encounter (Signed)
     Would recommend trying Torsemide 20mg  daily. Follow daily weights with initiation of this. Needs a repeat BMET in 2 weeks. Please arrange a follow-up visit with myself or SK in 3-4 weeks as well to assess response to diuretic (can be in-person or virtual pending patient's preference) as it does not appear follow-up was scheduled at the time of his last visit.   Also, please tell Mr. Troia Happy Birthday!  Thanks,  Erma Heritage, PA-C 05/24/2019, 12:49 PM

## 2019-05-30 ENCOUNTER — Ambulatory Visit (INDEPENDENT_AMBULATORY_CARE_PROVIDER_SITE_OTHER): Payer: Medicare HMO | Admitting: Family Medicine

## 2019-05-30 ENCOUNTER — Encounter: Payer: Self-pay | Admitting: Family Medicine

## 2019-05-30 ENCOUNTER — Other Ambulatory Visit: Payer: Self-pay

## 2019-05-30 VITALS — BP 142/80 | HR 68 | Temp 96.8°F | Wt 212.6 lb

## 2019-05-30 DIAGNOSIS — R5383 Other fatigue: Secondary | ICD-10-CM | POA: Diagnosis not present

## 2019-05-30 DIAGNOSIS — E118 Type 2 diabetes mellitus with unspecified complications: Secondary | ICD-10-CM

## 2019-05-30 DIAGNOSIS — I10 Essential (primary) hypertension: Secondary | ICD-10-CM | POA: Diagnosis not present

## 2019-05-30 DIAGNOSIS — D649 Anemia, unspecified: Secondary | ICD-10-CM | POA: Diagnosis not present

## 2019-05-30 NOTE — Progress Notes (Signed)
   Subjective:    Patient ID: Jack Mejia, male    DOB: 1945/09/29, 73 y.o.   MRN: [id number]  HPI Pt here for follow up. Pt was in hospital on 05/23/2019 for heart cath. Pt did not have to stay in hospital afterward.     Pt states he has been short of breath for about 6 months. Pt would like to know if he can come off of any of his med. He feels lousy, tired and sleepy all the time   Patient's been having progressive fatigue.  Recent work-up at the hospital for catheterization did reveal coronary artery disease.  No lesions were felt in need of stenting or other intervention other than medical therapy  Patient frustrated by all the medications he is having to take these days.  Also notes progressive fatigue  Notes his sugars have been more elevated trying to watch his diet in that regard.  Also anemia on his CBC with elevated MCV and was advised to follow-up with [place] on this Review of Systems No headache no chest pain positive shortness of breath with exertion    Objective:   Physical Exam Alert mild malaise HEENT normal lungs clear.  Heart rhythm irregular but rate in good control ankles trace edema.  Pulses good sensation intact       Assessment & Plan:  Impression 1 fatigue likely multifactorial.  Reminded of diminished atrial check with A. fib and impact on energy with exertion.  Also reminded of vital importance of all cardiovascular medications he currently is on  2.  Type 2 diabetes currently exact status uncertain.  A1c  3.  Anemia.  Macrocytic.  Once sent B12  Exercise within capabilities encouraged.  Maintain same meds weight blood work declines flu shot

## 2019-05-31 ENCOUNTER — Ambulatory Visit: Payer: Medicare HMO | Admitting: Student

## 2019-05-31 LAB — HEMOGLOBIN A1C
Est. average glucose Bld gHb Est-mCnc: 180 mg/dL
Hgb A1c MFr Bld: 7.9 % — ABNORMAL HIGH (ref 4.8–5.6)

## 2019-05-31 LAB — FOLATE: Folate: 10.6 ng/mL (ref >3.0)

## 2019-05-31 LAB — FERRITIN: Ferritin: 309 ng/mL (ref 30–400)

## 2019-05-31 LAB — VITAMIN B12: Vitamin B-12: 1558 pg/mL — ABNORMAL HIGH (ref 232–1245)

## 2019-06-13 ENCOUNTER — Encounter: Payer: Self-pay | Admitting: Family Medicine

## 2019-06-28 ENCOUNTER — Other Ambulatory Visit: Payer: Self-pay

## 2019-06-28 ENCOUNTER — Encounter: Payer: Self-pay | Admitting: Student

## 2019-06-28 ENCOUNTER — Other Ambulatory Visit (HOSPITAL_COMMUNITY)
Admission: RE | Admit: 2019-06-28 | Discharge: 2019-06-28 | Disposition: A | Discharge status: Home / Self Care | Payer: Medicare HMO | Source: Ambulatory Visit | Attending: Family Medicine | Admitting: Family Medicine

## 2019-06-28 ENCOUNTER — Ambulatory Visit: Payer: Medicare HMO | Admitting: Family Medicine

## 2019-06-28 VITALS — BP 150/70 | HR 60 | Temp 97.2°F | Ht 70.0 in | Wt 215.0 lb

## 2019-06-28 DIAGNOSIS — I2510 Atherosclerotic heart disease of native coronary artery without angina pectoris: Secondary | ICD-10-CM

## 2019-06-28 DIAGNOSIS — I5033 Acute on chronic diastolic (congestive) heart failure: Secondary | ICD-10-CM | POA: Diagnosis not present

## 2019-06-28 DIAGNOSIS — I110 Hypertensive heart disease with heart failure: Secondary | ICD-10-CM | POA: Diagnosis not present

## 2019-06-28 DIAGNOSIS — L508 Other urticaria: Secondary | ICD-10-CM | POA: Diagnosis not present

## 2019-06-28 DIAGNOSIS — I10 Essential (primary) hypertension: Secondary | ICD-10-CM | POA: Diagnosis not present

## 2019-06-28 DIAGNOSIS — I4891 Unspecified atrial fibrillation: Secondary | ICD-10-CM

## 2019-06-28 DIAGNOSIS — L308 Other specified dermatitis: Secondary | ICD-10-CM | POA: Diagnosis not present

## 2019-06-28 LAB — BASIC METABOLIC PANEL
Anion gap: 14 (ref 5–15)
BUN: 31 mg/dL — ABNORMAL HIGH (ref 8–23)
CO2: 23 mmol/L (ref 22–32)
Calcium: 8.6 mg/dL — ABNORMAL LOW (ref 8.9–10.3)
Chloride: 101 mmol/L (ref 98–111)
Creatinine, Ser: 1.81 mg/dL — ABNORMAL HIGH (ref 0.61–1.24)
GFR calc Af Amer: 42 mL/min — ABNORMAL LOW (ref >60)
GFR calc non Af Amer: 36 mL/min — ABNORMAL LOW (ref >60)
Glucose, Bld: 208 mg/dL — ABNORMAL HIGH (ref 70–99)
Potassium: 4.2 mmol/L (ref 3.5–5.1)
Sodium: 138 mmol/L (ref 135–145)

## 2019-06-28 MED ORDER — ATORVASTATIN CALCIUM 10 MG PO TABS: 10.0000 mg | ORAL_TABLET | Freq: Every day | ORAL | 3 refills | Status: DC

## 2019-06-28 NOTE — Patient Instructions (Addendum)
Medication Instructions:  START Lipitor 10 mg daily  STOP Eliquis  *If you need a refill on your cardiac medications before your next appointment, please call your pharmacy*  Lab Work: BMET Today   Fasting Lipid and LFT's in 6-8 weeks  If you have labs (blood work) drawn today and your tests are completely normal, you will receive your results only by: [name] MyChart Message (if you have MyChart) OR . A paper copy in the mail If you have any lab test that is abnormal or we need to change your treatment, we will call you to review the results.  Testing/Procedures: None today  Follow-Up: At [facility], you and your health needs are our priority.  As part of our continuing mission to provide you with exceptional heart care, we have created designated Provider Care Teams.  These Care Teams include your primary Cardiologist (physician) and Advanced Practice Providers (APPs -  Physician Assistants and Nurse Practitioners) who all work together to provide you with the care you need, when you need it.  Your next appointment:   3 months  The format for your next appointment:   In Person  Provider:   [name], MD  Other Instructions We will refer you to the Coumadin clinic       Thank you for choosing [facility] !

## 2019-06-28 NOTE — Progress Notes (Addendum)
Cardiology Office Note  Date: 06/28/2019   ID: [name], [name] [birth year], MRN [id number]  PCP:  [name], MD  Cardiologist:  [name], MD Electrophysiologist:  None   Chief Complaint  Patient presents with  . Follow-up    History of Present Illness: Jack Mejia is a 73 y.o. male status post recent cardiac catheterization on May 23, 2019 for stable angina and abnormal FFR CT. Catheterization showed diffuse distal coronary artery disease involving PDA, left ventricular branch, diagonal, distal LAD, and first obtuse marginal. Multi-focality medical therapy was recommended.  Patient continues to complain of exertional fatigue on moderate activity. He notices chest tightness on occasion with or without activity. He denies any predictable pattern to the tightness. He denies radiation or associated nausea or diaphoresis.  He continues to complain of pruritic rash onset when he started Lasix. Lasix was discontinued and replaced with Torsemide. He states rash continues. He saw his PCP earlier today who prescribed a topical cream and H2 blocker (Cimetidine), for symptomatic treatment. Patient has not picked this medication up yet.  He states he cannot afford the cost of eliquis. He is asking for an affordable alternative  Past Medical History:  Diagnosis Date  . Arthritis   . Atrial fibrillation ([place])    a. s/p DCCV in 03/2019  . Cramps of left lower extremity   . Depression   . Diabetes mellitus    type 2 for 7-8 yrs  . Dyspnea   . Elevated liver enzymes   . Fatty liver   . History of kidney stones   . Hyperlipidemia   . Hypertension   . Vertigo     Past Surgical History:  Procedure Laterality Date  . CARDIOVERSION N/A 04/21/2019   Procedure: CARDIOVERSION;  Surgeon: Sanda Klein, MD;  Location: [facility];  Service: Cardiovascular;  Laterality: N/A;  . CARPAL TUNNEL RELEASE     both wrist  . cataract surgery     bilateral  .  CHOLECYSTECTOMY    . EYE SURGERY    . HEMORROIDECTOMY    . LEFT HEART CATH AND CORONARY ANGIOGRAPHY N/A 05/23/2019   Procedure: LEFT HEART CATH AND CORONARY ANGIOGRAPHY;  Surgeon: Belva Crome, MD;  Location: [facility];  Service: Cardiovascular;  Laterality: N/A;  . LUMBAR LAMINECTOMY/DECOMPRESSION MICRODISCECTOMY N/A 08/06/2016   Procedure: LUMBAR THREE- LUMBAR FIVE  DECOMPRESSIVE LUMBAR LAMINECTOMY;  Surgeon: Jovita Gamma, MD;  Location: [facility];  Service: Neurosurgery;  Laterality: N/A;    Current Outpatient Medications  Medication Sig Dispense Refill  . amiodarone (PACERONE) 200 MG tablet Take 1 tablet (200 mg total) by mouth daily. 90 tablet 2  . Cyanocobalamin (VITAMIN B 12 PO) Take 2,500 mcg by mouth daily.    [name] diltiazem (CARDIZEM CD) 360 MG 24 hr capsule Take 1 capsule (360 mg total) by mouth daily. 60 capsule 2  . glipiZIDE (GLUCOTROL) 5 MG tablet TAKE 2 TABLETS BY MOUTH TWICE A DAY 360 tablet 1  . metFORMIN (GLUCOPHAGE) 500 MG tablet TAKE 2 TABLETS BY MOUTH TWICE A DAY 360 tablet 1  . metoprolol tartrate (LOPRESSOR) 25 MG tablet Take 1 tablet (25 mg total) by mouth 2 (two) times daily. 120 tablet 2  . Omega-3 Fatty Acids (OMEGA-3 FISH OIL PO) Take 1 capsule by mouth daily.     [name] torsemide (DEMADEX) 20 MG tablet Take 1 tablet (20 mg total) by mouth daily. 90 tablet 03  . atorvastatin (LIPITOR) 10 MG tablet Take 1 tablet (  10 mg total) by mouth daily. 90 tablet 3   No current facility-administered medications for this visit.    Allergies:  Demerol, Vasotec [enalapril], and Lasix [furosemide]   Social History: The patient  reports that he quit smoking about 36 years ago. His smoking use included cigarettes. He has never used smokeless tobacco. He reports that he does not drink alcohol or use drugs.   Family History: The patient's family history includes Cancer in his mother; Diabetes in his maternal grandmother, mother, and sister.   ROS:  Please see the history of  present illness. Otherwise, complete review of systems is positive for none.  All other systems are reviewed and negative.   Physical Exam: VS:  BP (!) 150/70   Pulse 60   Temp (!) 97.2 F (36.2 C)   Ht 5\' 10"  (1.778 m)   Wt 215 lb (97.5 kg)   SpO2 93%   BMI 30.85 kg/m , BMI Body mass index is 30.85 kg/m.  Wt Readings from Last 3 Encounters:  06/28/19 215 lb (97.5 kg)  05/30/19 212 lb 9.6 oz (96.4 kg)  05/23/19 216 lb (98 kg)    General: Patient appears comfortable at rest. Neck: Supple, no elevated JVP or carotid bruits, no thyromegaly. Lungs: Clear to auscultation, nonlabored breathing at rest. Cardiac: Regular rate and rhythm, no S3 or significant systolic murmur, no pericardial rub. Extremities: trace edema, distal pulses 2. Skin: right radial access site clean,  Warm and dry. Musculoskeletal: No kyphosis. Neuropsychiatric: Alert and oriented x3, affect grossly appropriate.  ECG:  An ECG dated 05/23/2019 was personally reviewed today and demonstrated:  NSR rate of 70 Non-specific T wave abnormality  Recent Labwork: 02/21/2019: TSH 0.740 02/22/2019: ALT 22; AST 18 04/15/2019: B Natriuretic Peptide 236.1 04/17/2019: Magnesium 1.8 05/19/2019: Platelets 164 05/23/2019: Hemoglobin 9.9 06/28/2019: BUN 31; Creatinine, Ser 1.81; Potassium 4.2; Sodium 138     Component Value Date/Time   CHOL 173 05/04/2018 1056   TRIG 278 (H) 05/04/2018 1056   HDL 34 (L) 05/04/2018 1056   CHOLHDL 5.1 (H) 05/04/2018 1056   CHOLHDL 4.4 02/21/2014 0703   VLDL 38 02/21/2014 0703   LDLCALC 83 05/04/2018 1056    Other Studies Reviewed Today: Cardiac catheterization on May 23, 2019   Normal left main  30 to 40% proximal LAD, first diagonal 50% proximal and 70% distal, and diffuse 70% narrowing in the distal/apical LAD.  First obtuse marginal large in size with proximal segmental 50% narrowing and distal 90% narrowing.  Dominant RCA with 30 to 40% ostial to proximal narrowing, diffuse  disease in the ostial to mid PDA 80 to 90%, and 80% segmental proximal second left ventricular branch.  Previous echo documentation of EF greater than 55%.  After hydration today LVEDP 26 mmHg.  RECOMMENDATIONS:   Diffuse, predominantly distal coronary artery disease involving PDA, left ventricular branch, diagonal, distal LAD, and first obtuse marginal.  Considering multifocality medical therapy is most appropriate especially in absence of any proximal vessel disease.  PCI is possible on the PDA, left ventricular branch, and obtuse marginal but each would require significant contrast and place the patient at risk for acute kidney injury.  Significant elevation in LVEDP and history of orthopnea at home suggest that diastolic heart failure needs therapy with additional diuresis.  EXAM: FFR CT 05/04/2019  TECHNIQUE: The best systolic and diastolic phases of the patients gated cardiac CTA sent to HeartFlow for hemodynamic analysis  FINDINGS: Abnormal FFR CT Distal PLB 0.81 Distal PDA 0.81 Mid  LAD 0.86 and distal LAD 0.61 OM1 mid 0.83 and distal 0.69  IMPRESSION: Abnormal FFR CT suggesting obstructive disease in mid and distal LAD and OM1    Echocardiogram: 02/22/2019 IMPRESSIONS  1. The left ventricle has normal systolic function with an ejection fraction of 60-65%. The cavity size was normal. There is moderately increased left ventricular wall thickness. Left ventricular diastolic Doppler parameters are indeterminate. 2. The right ventricle has normal systolic function. The cavity was normal. There is no increase in right ventricular wall thickness. 3. No evidence of mitral valve stenosis. 4. The aortic valve is tricuspid. Mild thickening of the aortic valve. Mild calcification of the aortic valve. No stenosis of the aortic valve. Mild aortic annular calcification noted. 5. The aorta is normal in size and structure. 6. The aortic root is normal in size and structure. 7.  Pulmonary hypertension is indeterminate, inadequate TR jet.  Assessment and Plan:  1. Coronary artery disease involving native coronary artery of native heart without angina pectoris   2. Acute on chronic diastolic (congestive) heart failure ([place])   3. Atrial fibrillation with rapid ventricular response ([place])   4. Essential hypertension    1. Coronary artery disease involving native coronary artery of native heart without angina pectoris.  With diffuse distal coronary artery disease involving the PDA, left ventricular branch, diagonal, distal LAD and first obtuse marginal.  Multifocal medical therapy was recommended in the absence of any proximal vessel disease. Start Atorvastatin 10mg  daily. Get FLP and LFT in 6-8 weeks. Patient was previously taking Rosuvastatin in the past. At some point the medication was stopped. Patient states he had an intolerance to a medication in the past but does not remember if it was a statin medication. No on ASA d/t anti-coagulation.  2. Acute on chronic diastolic (congestive) heart failure (HCC) Recent echocardiogram demonstrated EF of 60-65%. Moderately increased LV wall thickness. LV diastolic doppler parameters were indeterminate. Patient does have trace lower extremity edema bilaterally. Lungs are CTA. Patient states initially when started on Lasix his weight decreased but he has gained some of the weight back. On Torsemide 20 mg daily. Get BMP   3.PAF History of PAF. Heart rate today 60 and regularly regular. He is S/P DCCV on 04/21/2019. Refer to Coumadin clinic. Patient is unable to afford Eliquis and is willing to start Coumadin.    4. Essential hypertension SBP elevated today. 150/70. Patient and friend state his systolic pressures at home seem to hover around the 150s recently. If SBP continues to trend in this direction may need to consider addition of ARB.   Medication Adjustments/Labs and Tests Ordered: Current medicines are reviewed at length with  the patient today.  Concerns regarding medicines are outlined above.    Patient Instructions  Medication Instructions:  START Lipitor 10 mg daily  STOP Eliquis  *If you need a refill on your cardiac medications before your next appointment, please call your pharmacy*  Lab Work: BMET Today   Fasting Lipid and LFT's in 6-8 weeks  If you have labs (blood work) drawn today and your tests are completely normal, you will receive your results only by: [name] MyChart Message (if you have MyChart) OR . A paper copy in the mail If you have any lab test that is abnormal or we need to change your treatment, we will call you to review the results.  Testing/Procedures: None today  Follow-Up: At [facility], you and your health needs are our priority.  As part of our continuing  mission to provide you with exceptional heart care, we have created designated Provider Care Teams.  These Care Teams include your primary Cardiologist (physician) and Advanced Practice Providers (APPs -  Physician Assistants and Nurse Practitioners) who all work together to provide you with the care you need, when you need it.  Your next appointment:   3 months  The format for your next appointment:   In Person  Provider:   [name], MD  Other Instructions We will refer you to the Coumadin clinic       Thank you for choosing [facility] !                Signed, Levell July, NP 06/28/2019 10:27 PM    [facility] [place], [place], [place] [address] Phone: [phone]; Fax: [phone]

## 2019-06-29 ENCOUNTER — Encounter: Payer: Self-pay | Admitting: Family Medicine

## 2019-06-29 DIAGNOSIS — I2510 Atherosclerotic heart disease of native coronary artery without angina pectoris: Secondary | ICD-10-CM

## 2019-06-29 HISTORY — DX: Atherosclerotic heart disease of native coronary artery without angina pectoris: I25.10

## 2019-06-30 ENCOUNTER — Telehealth: Payer: Self-pay | Admitting: *Deleted

## 2019-06-30 DIAGNOSIS — Z79899 Other long term (current) drug therapy: Secondary | ICD-10-CM

## 2019-06-30 MED ORDER — TORSEMIDE 20 MG PO TABS: 10.0000 mg | ORAL_TABLET | Freq: Every day | ORAL | 3 refills | Status: DC

## 2019-06-30 NOTE — Telephone Encounter (Signed)
-----   Message from Verta Ellen., NP sent at 06/29/2019 11:52 AM EST ----- Please decrease Torsemide to 1/2 tablet daily.  Recheck BMP in 2 weeks

## 2019-07-03 ENCOUNTER — Telehealth: Payer: Self-pay | Admitting: *Deleted

## 2019-07-03 NOTE — Telephone Encounter (Signed)
Spoke with patient about changing from Eliquis to Warfarin due to cost.  States he has about a month of Eliquis left.  He will call me 1 wk before he runs out of Eliquis so he can overlap with warfarin.  Will need to send in warfarin Rx at that time.  Pt verbalized understanding.

## 2019-07-14 ENCOUNTER — Other Ambulatory Visit (HOSPITAL_COMMUNITY)
Admission: RE | Admit: 2019-07-14 | Discharge: 2019-07-14 | Disposition: A | Discharge status: Home / Self Care | Payer: Medicare HMO | Source: Ambulatory Visit | Attending: Family Medicine | Admitting: Family Medicine

## 2019-07-14 DIAGNOSIS — Z79899 Other long term (current) drug therapy: Secondary | ICD-10-CM | POA: Insufficient documentation

## 2019-07-14 LAB — BASIC METABOLIC PANEL
Anion gap: 11 (ref 5–15)
BUN: 25 mg/dL — ABNORMAL HIGH (ref 8–23)
CO2: 25 mmol/L (ref 22–32)
Calcium: 9.4 mg/dL (ref 8.9–10.3)
Chloride: 100 mmol/L (ref 98–111)
Creatinine, Ser: 1.81 mg/dL — ABNORMAL HIGH (ref 0.61–1.24)
GFR calc Af Amer: 42 mL/min — ABNORMAL LOW (ref >60)
GFR calc non Af Amer: 36 mL/min — ABNORMAL LOW (ref >60)
Glucose, Bld: 269 mg/dL — ABNORMAL HIGH (ref 70–99)
Potassium: 4.1 mmol/L (ref 3.5–5.1)
Sodium: 136 mmol/L (ref 135–145)

## 2019-08-08 ENCOUNTER — Other Ambulatory Visit: Payer: Self-pay | Admitting: *Deleted

## 2019-08-08 ENCOUNTER — Telehealth: Payer: Self-pay | Admitting: Cardiovascular Disease

## 2019-08-08 ENCOUNTER — Telehealth: Payer: Self-pay | Admitting: Family Medicine

## 2019-08-08 MED ORDER — FAMOTIDINE 40 MG PO TABS: 40.0000 mg | ORAL_TABLET | Freq: Every day | ORAL | 0 refills | Status: DC

## 2019-08-08 NOTE — Telephone Encounter (Signed)
Patient notified and verbalized understanding. 

## 2019-08-08 NOTE — Telephone Encounter (Signed)
Patient states cimetidine is $400.I suggested he call Dr.Luking, he will speak with him

## 2019-08-08 NOTE — Telephone Encounter (Signed)
Pt states the Cimetidine (Tagamet) is now costing $400.00/month & would like something else, pharmacy told him to call us  Pt states he needs a refill or something else for his reflux  States he called his cardiologist & they told him to call us  (I did not see this medicine on his list or his med history)  Please advise & call pt

## 2019-08-08 NOTE — Telephone Encounter (Signed)
Pepcid 40 mg daily

## 2019-08-08 NOTE — Telephone Encounter (Signed)
Med sent to pharm. Just need to call and let pt know its been sent to [facility]

## 2019-08-08 NOTE — Telephone Encounter (Signed)
Please give pt a call concerning one of his medications, it is not on his med list.

## 2019-08-23 ENCOUNTER — Other Ambulatory Visit: Payer: Self-pay | Admitting: Student

## 2019-08-23 MED ORDER — METOPROLOL TARTRATE 25 MG PO TABS: 25.0000 mg | ORAL_TABLET | Freq: Two times a day (BID) | ORAL | 3 refills | Status: DC

## 2019-08-23 MED ORDER — AMIODARONE HCL 200 MG PO TABS: 200.0000 mg | ORAL_TABLET | Freq: Every day | ORAL | 3 refills | Status: DC

## 2019-08-23 MED ORDER — TORSEMIDE 20 MG PO TABS: 10.0000 mg | ORAL_TABLET | Freq: Every day | ORAL | 3 refills | Status: DC

## 2019-08-23 MED ORDER — DILTIAZEM HCL ER COATED BEADS 360 MG PO CP24: 360.0000 mg | ORAL_CAPSULE | Freq: Every day | ORAL | 3 refills | Status: DC

## 2019-08-23 MED ORDER — ATORVASTATIN CALCIUM 10 MG PO TABS: 10.0000 mg | ORAL_TABLET | Freq: Every day | ORAL | 3 refills | Status: DC

## 2019-08-24 ENCOUNTER — Other Ambulatory Visit: Payer: Self-pay | Admitting: *Deleted

## 2019-08-24 MED ORDER — AMIODARONE HCL 200 MG PO TABS: 200.0000 mg | ORAL_TABLET | Freq: Every day | ORAL | 3 refills | Status: DC

## 2019-08-24 MED ORDER — DILTIAZEM HCL ER COATED BEADS 360 MG PO CP24: 360.0000 mg | ORAL_CAPSULE | Freq: Every day | ORAL | 3 refills | Status: DC

## 2019-08-24 MED ORDER — ATORVASTATIN CALCIUM 10 MG PO TABS: 10.0000 mg | ORAL_TABLET | Freq: Every day | ORAL | 3 refills | Status: DC

## 2019-08-24 MED ORDER — METOPROLOL TARTRATE 25 MG PO TABS: 25.0000 mg | ORAL_TABLET | Freq: Two times a day (BID) | ORAL | 3 refills | Status: DC

## 2019-08-24 MED ORDER — TORSEMIDE 20 MG PO TABS: 10.0000 mg | ORAL_TABLET | Freq: Every day | ORAL | 3 refills | Status: DC

## 2019-08-30 ENCOUNTER — Other Ambulatory Visit: Payer: Self-pay

## 2019-08-30 ENCOUNTER — Encounter: Payer: Self-pay | Admitting: Family Medicine

## 2019-08-30 ENCOUNTER — Ambulatory Visit (INDEPENDENT_AMBULATORY_CARE_PROVIDER_SITE_OTHER): Payer: Medicare HMO | Admitting: Family Medicine

## 2019-08-30 DIAGNOSIS — E118 Type 2 diabetes mellitus with unspecified complications: Secondary | ICD-10-CM | POA: Diagnosis not present

## 2019-08-30 DIAGNOSIS — I10 Essential (primary) hypertension: Secondary | ICD-10-CM

## 2019-08-30 MED ORDER — GLIPIZIDE 5 MG PO TABS: 10.0000 mg | ORAL_TABLET | Freq: Two times a day (BID) | ORAL | 1 refills | Status: DC

## 2019-08-30 MED ORDER — FAMOTIDINE 40 MG PO TABS: 40.0000 mg | ORAL_TABLET | Freq: Every day | ORAL | 1 refills | Status: DC

## 2019-08-30 MED ORDER — METFORMIN HCL 500 MG PO TABS: 1000.0000 mg | ORAL_TABLET | Freq: Two times a day (BID) | ORAL | 1 refills | Status: DC

## 2019-08-30 NOTE — Progress Notes (Signed)
   Subjective:    Patient ID: Jack Mejia, male    DOB: Nov 03, 1945, 74 y.o.   MRN: [id number]  Hypertension This is a chronic problem. The current episode started more than 1 year ago. Risk factors for coronary artery disease include diabetes mellitus, dyslipidemia and male gender. Treatments tried: metoprolol. There are no compliance problems.    No problems or concerns.   Review of Systems  Virtual Visit via Video Note  I connected with Jack Mejia on 08/30/19 at 10:00 AM EST by a video enabled telemedicine application and verified that I am speaking with the correct person using two identifiers.  Location: Patient: home Provider: office   I discussed the limitations of evaluation and management by telemedicine and the availability of in person appointments. The patient expressed understanding and agreed to proceed.  History of Present Illness:    Observations/Objective:   Assessment and Plan:   Follow Up Instructions:    I discussed the assessment and treatment plan with the patient. The patient was provided an opportunity to ask questions and all were answered. The patient agreed with the plan and demonstrated an understanding of the instructions.   The patient was advised to call back or seek an in-person evaluation if the symptoms worsen or if the condition fails to improve as anticipated.  I provided 22 minutes of non-face-to-face time during this encounter.   Patient claims compliance with diabetes medication. No obvious side effects. Reports no substantial low sugar spells. Most numbers are generally in good range when checked fasting. Generally does not miss a dose of medication. Watching diabetic diet closely Most glucose is running in the low 100s  No headache no chest pain no shortness of breath still some fatigue with exertion     Objective:   Physical Exam  Virtual      Assessment & Plan:  Impression 1 hypertension good control discussed  maintain same meds  2.  Type 2 diabetes.  A1c decent 3 months ago.  Sugars mostly running good a bit elevated today trying to watch diet although he admits to much noncompliance in this regard  Follow-up in 3 months for wellness plus chronic symptom care discussed warning signs discussed diet exercise discussed

## 2019-08-31 ENCOUNTER — Encounter: Payer: Self-pay | Admitting: Family Medicine

## 2019-09-06 ENCOUNTER — Telehealth: Payer: Self-pay | Admitting: Student

## 2019-09-06 MED ORDER — APIXABAN 5 MG PO TABS: 5.0000 mg | ORAL_TABLET | Freq: Two times a day (BID) | ORAL | 6 refills | Status: DC

## 2019-09-06 NOTE — Telephone Encounter (Signed)
Returned pt call. He states that he was unable to afford eliquis previously and was switched to coumadin during office visit with Katina Dung, NP. On 06/28/19. Since then he has decided he will stay on eliquis 5 mg- bid. He needs a new RX for that printed so he can take it to health dept. As they are helping him with his medications. Is is okay to restart eliquis? Please advise.

## 2019-09-06 NOTE — Telephone Encounter (Signed)
     By review of notes, he has actually remained on Eliquis and was never started on Coumadin.  Can provide printed Rx for Eliquis 5 mg twice daily.  Signed, Erma Heritage, PA-C 09/06/2019, 4:15 PM Pager: [phone]

## 2019-09-06 NOTE — Telephone Encounter (Signed)
DONE

## 2019-09-06 NOTE — Telephone Encounter (Signed)
Please give pt a call -- has a question regarding medications

## 2019-09-06 NOTE — Telephone Encounter (Signed)
Returned pt call  

## 2019-09-12 ENCOUNTER — Telehealth: Payer: Self-pay | Admitting: Family Medicine

## 2019-09-12 NOTE — Telephone Encounter (Signed)
Patient notified and verbalized understanding. 

## 2019-09-12 NOTE — Telephone Encounter (Signed)
Definitely ok, get the shot!

## 2019-09-12 NOTE — Telephone Encounter (Signed)
Webb Silversmith [phone] (Girlfriend) She said he can't hear his phone.  He is scheduled to get his first Covid shot Thursday and they told her since he is on a blood thinner to check with his doctor to make sure it is ok for him to get the shot.

## 2019-10-03 ENCOUNTER — Encounter: Payer: Self-pay | Admitting: Cardiovascular Disease

## 2019-10-03 ENCOUNTER — Ambulatory Visit (INDEPENDENT_AMBULATORY_CARE_PROVIDER_SITE_OTHER): Payer: Medicare HMO | Admitting: Cardiovascular Disease

## 2019-10-03 ENCOUNTER — Other Ambulatory Visit: Payer: Self-pay

## 2019-10-03 VITALS — BP 160/80 | Temp 98.7°F | Ht 70.0 in | Wt 216.0 lb

## 2019-10-03 DIAGNOSIS — I10 Essential (primary) hypertension: Secondary | ICD-10-CM | POA: Diagnosis not present

## 2019-10-03 DIAGNOSIS — I25118 Atherosclerotic heart disease of native coronary artery with other forms of angina pectoris: Secondary | ICD-10-CM

## 2019-10-03 DIAGNOSIS — N183 Chronic kidney disease, stage 3 unspecified: Secondary | ICD-10-CM | POA: Diagnosis not present

## 2019-10-03 DIAGNOSIS — I480 Paroxysmal atrial fibrillation: Secondary | ICD-10-CM

## 2019-10-03 DIAGNOSIS — I5032 Chronic diastolic (congestive) heart failure: Secondary | ICD-10-CM

## 2019-10-03 MED ORDER — APIXABAN 5 MG PO TABS: 5.0000 mg | ORAL_TABLET | Freq: Two times a day (BID) | ORAL | 6 refills | Status: DC

## 2019-10-03 MED ORDER — ATORVASTATIN CALCIUM 20 MG PO TABS: 20.0000 mg | ORAL_TABLET | Freq: Every day | ORAL | 3 refills | Status: DC

## 2019-10-03 NOTE — Patient Instructions (Signed)
Medication Instructions:  INCREASE Atorvastatin to 20 mg at dinner  *If you need a refill on your cardiac medications before your next appointment, please call your pharmacy*   Lab Work: None today If you have labs (blood work) drawn today and your tests are completely normal, you will receive your results only by: [name] MyChart Message (if you have MyChart) OR . A paper copy in the mail If you have any lab test that is abnormal or we need to change your treatment, we will call you to review the results.   Testing/Procedures: None today   Follow-Up: At [facility], you and your health needs are our priority.  As part of our continuing mission to provide you with exceptional heart care, we have created designated Provider Care Teams.  These Care Teams include your primary Cardiologist (physician) and Advanced Practice Providers (APPs -  Physician Assistants and Nurse Practitioners) who all work together to provide you with the care you need, when you need it.  We recommend signing up for the patient portal called "MyChart".  Sign up information is provided on this After Visit Summary.  MyChart is used to connect with patients for Virtual Visits (Telemedicine).  Patients are able to view lab/test results, encounter notes, upcoming appointments, etc.  Non-urgent messages can be sent to your provider as well.   To learn more about what you can do with MyChart, go to [email or web address].    Your next appointment:   6 month(s)  The format for your next appointment:   In Person  Provider:   Bernerd Pho, PA-C   Other Instructions None       Thank you for choosing [facility] !

## 2019-10-03 NOTE — Progress Notes (Signed)
SUBJECTIVE: The patient presents for follow-up of paroxysmal atrial fibrillation, chronic diastolic heart failure, and coronary artery disease.  He also has hypertension, hyperlipidemia, type 2 diabetes mellitus, and chronic kidney disease stage III.  He is here with his wife.  He has been having some left groin pain which she has had in the past.  Is been going on for about 10 days.  He denies swelling associated with it.  He denies chest pain and palpitations.  He has occasional exertional dyspnea.  He checks his blood pressure at home with several systolic readings in the 161 range.  He says he feels best when systolic blood pressure is 150.  He does not feel good when it is under 130.     Review of Systems: As per "subjective", otherwise negative.  Allergies  Allergen Reactions  . Demerol Nausea And Vomiting  . Vasotec [Enalapril] Cough  . Lasix [Furosemide] Rash    Current Outpatient Medications  Medication Sig Dispense Refill  . amiodarone (PACERONE) 200 MG tablet Take 1 tablet (200 mg total) by mouth daily. 90 tablet 3  . apixaban (ELIQUIS) 5 MG TABS tablet Take 1 tablet (5 mg total) by mouth 2 (two) times daily. 60 tablet 6  . atorvastatin (LIPITOR) 10 MG tablet Take 1 tablet (10 mg total) by mouth daily. 90 tablet 3  . Cyanocobalamin (VITAMIN B 12 PO) Take 2,500 mcg by mouth daily.    [name] diltiazem (CARDIZEM CD) 360 MG 24 hr capsule Take 1 capsule (360 mg total) by mouth daily. 90 capsule 3  . famotidine (PEPCID) 40 MG tablet Take 1 tablet (40 mg total) by mouth daily. 90 tablet 1  . glipiZIDE (GLUCOTROL) 5 MG tablet Take 2 tablets (10 mg total) by mouth 2 (two) times daily. 360 tablet 1  . metFORMIN (GLUCOPHAGE) 500 MG tablet Take 2 tablets (1,000 mg total) by mouth 2 (two) times daily. 360 tablet 1  . metoprolol tartrate (LOPRESSOR) 25 MG tablet Take 1 tablet (25 mg total) by mouth 2 (two) times daily. 180 tablet 3  . Omega-3 Fatty Acids (OMEGA-3 FISH OIL PO) Take  1 capsule by mouth daily.     [name] torsemide (DEMADEX) 20 MG tablet Take 0.5 tablets (10 mg total) by mouth daily. 45 tablet 3   No current facility-administered medications for this visit.    Past Medical History:  Diagnosis Date  . Arthritis   . Atrial fibrillation ([place])    a. s/p DCCV in 03/2019  . CAD (coronary artery disease) 06/29/2019  . Cramps of left lower extremity   . Depression   . Diabetes mellitus    type 2 for 7-8 yrs  . Dyspnea   . Elevated liver enzymes   . Fatty liver   . History of kidney stones   . Hyperlipidemia   . Hypertension   . Vertigo     Past Surgical History:  Procedure Laterality Date  . CARDIOVERSION N/A 04/21/2019   Procedure: CARDIOVERSION;  Surgeon: Sanda Klein, MD;  Location: [facility];  Service: Cardiovascular;  Laterality: N/A;  . CARPAL TUNNEL RELEASE     both wrist  . cataract surgery     bilateral  . CHOLECYSTECTOMY    . EYE SURGERY    . HEMORROIDECTOMY    . LEFT HEART CATH AND CORONARY ANGIOGRAPHY N/A 05/23/2019   Procedure: LEFT HEART CATH AND CORONARY ANGIOGRAPHY;  Surgeon: Belva Crome, MD;  Location: [facility];  Service: Cardiovascular;  Laterality: N/A;  . LUMBAR LAMINECTOMY/DECOMPRESSION MICRODISCECTOMY N/A 08/06/2016   Procedure: LUMBAR THREE- LUMBAR FIVE  DECOMPRESSIVE LUMBAR LAMINECTOMY;  Surgeon: Jovita Gamma, MD;  Location: [facility];  Service: Neurosurgery;  Laterality: N/A;    Social History   Socioeconomic History  . Marital status: Divorced    Spouse name: Not on file  . Number of children: Not on file  . Years of education: Not on file  . Highest education level: Not on file  Occupational History  . Not on file  Tobacco Use  . Smoking status: Former Smoker    Types: Cigarettes    Quit date: 10/14/1982    Years since quitting: 36.9  . Smokeless tobacco: Never Used  . Tobacco comment: 29 yrs ago  Substance and Sexual Activity  . Alcohol use: No  . Drug use: No  . Sexual activity: Not  Currently    Birth control/protection: None  Other Topics Concern  . Not on file  Social History Narrative  . Not on file   Social Determinants of Health   Financial Resource Strain: Low Risk   . Difficulty of Paying Living Expenses: Not hard at all  Food Insecurity: No Food Insecurity  . Worried About Charity fundraiser in the Last Year: Never true  . Ran Out of Food in the Last Year: Never true  Transportation Needs: No Transportation Needs  . Lack of Transportation (Medical): No  . Lack of Transportation (Non-Medical): No  Physical Activity: Inactive  . Days of Exercise per Week: 0 days  . Minutes of Exercise per Session: 0 min  Stress: No Stress Concern Present  . Feeling of Stress : Not at all  Social Connections: Moderately Isolated  . Frequency of Communication with Friends and Family: More than three times a week  . Frequency of Social Gatherings with Friends and Family: More than three times a week  . Attends Religious Services: Never  . Active Member of Clubs or Organizations: No  . Attends Archivist Meetings: Never  . Marital Status: Divorced  Human resources officer Violence: Not At Risk  . Fear of Current or Ex-Partner: No  . Emotionally Abused: No  . Physically Abused: No  . Sexually Abused: No     Vitals:   10/03/19 1409  BP: (!) 160/80  Temp: 98.7 F (37.1 C)  SpO2: 98%  Height: 5\' 10"  (1.778 m)    Wt Readings from Last 3 Encounters:  06/28/19 215 lb (97.5 kg)  05/30/19 212 lb 9.6 oz (96.4 kg)  05/23/19 216 lb (98 kg)     PHYSICAL EXAM General: NAD HEENT: Normal. Neck: No JVD, no thyromegaly. Lungs: Clear to auscultation bilaterally with normal respiratory effort. CV: Regular rate and rhythm, normal S1/S2, no S3/S4, no murmur. No pretibial or periankle edema.  No carotid bruit.   Abdomen: Soft, nontender, no distention.  Neurologic: Alert and oriented.  Psych: Normal affect. Skin: Normal. Musculoskeletal: No gross  deformities.      Labs: Lab Results  Component Value Date/Time   K 4.1 07/14/2019 11:19 AM   BUN 25 (H) 07/14/2019 11:19 AM   BUN 23 05/04/2018 10:56 AM   CREATININE 1.81 (H) 07/14/2019 11:19 AM   CREATININE 1.16 02/21/2014 07:03 AM   ALT 22 02/22/2019 05:14 AM   TSH 0.740 02/21/2019 04:08 PM   HGB 9.9 (L) 05/23/2019 10:21 AM   HGB 11.9 (L) 09/07/2018 04:38 PM     Lipids: Lab Results  Component Value Date/Time   LDLCALC  83 05/04/2018 10:56 AM   CHOL 173 05/04/2018 10:56 AM   TRIG 278 (H) 05/04/2018 10:56 AM   HDL 34 (L) 05/04/2018 10:56 AM      Cardiac catheterization 05/23/2019:   Normal left main  30 to 40% proximal LAD, first diagonal 50% proximal and 70% distal, and diffuse 70% narrowing in the distal/apical LAD.  First obtuse marginal large in size with proximal segmental 50% narrowing and distal 90% narrowing.  Dominant RCA with 30 to 40% ostial to proximal narrowing, diffuse disease in the ostial to mid PDA 80 to 90%, and 80% segmental proximal second left ventricular branch.  Previous echo documentation of EF greater than 55%.  After hydration today LVEDP 26 mmHg.  RECOMMENDATIONS:   Diffuse, predominantly distal coronary artery disease involving PDA, left ventricular branch, diagonal, distal LAD, and first obtuse marginal.  Considering multifocality medical therapy is most appropriate especially in absence of any proximal vessel disease.  PCI is possible on the PDA, left ventricular branch, and obtuse marginal but each would require significant contrast and place the patient at risk for acute kidney injury.  Significant elevation in LVEDP and history of orthopnea at home suggest that diastolic heart failure needs therapy with additional diuresis.    Echocardiogram 02/22/2019:  1. The left ventricle has normal systolic function with an ejection  fraction of 60-65%. The cavity size was normal. There is moderately  increased left ventricular wall  thickness. Left ventricular diastolic  Doppler parameters are indeterminate.  2. The right ventricle has normal systolic function. The cavity was  normal. There is no increase in right ventricular wall thickness.  3. No evidence of mitral valve stenosis.  4. The aortic valve is tricuspid. Mild thickening of the aortic valve.  Mild calcification of the aortic valve. No stenosis of the aortic valve.  Mild aortic annular calcification noted.  5. The aorta is normal in size and structure.  6. The aortic root is normal in size and structure.  7. Pulmonary hypertension is indeterminate, inadequate TR jet.   ASSESSMENT AND PLAN:  1.  Coronary artery disease: Cardiac catheterization results reviewed above.  Symptomatically stable.  Continue medical therapy with atorvastatin (increased to 20 mg daily) and beta-blocker.  No aspirin as he is on Eliquis.  2.  Chronic diastolic heart failure: Continue torsemide 10 mg daily.  3.  Paroxysmal atrial fibrillation: He underwent DCCV on 04/21/2019.  He remains on amiodarone, diltiazem, and metoprolol.  Symptomatically stable.  Continue Eliquis for anticoagulation (I will provide another prescription as he says he lost the previous 1).  4.  Chronic kidney disease stage III: Creatinine 1.81 on 07/14/2019.  5.  Hypertension: BP is elevated.  See discussion above.  He feels best when SBP is 150.  He does not feel well when it is under 130.  He will continue to monitor this at home.   Disposition: Follow up 6 months   [name], M.D., F.A.C.C.

## 2019-10-11 ENCOUNTER — Other Ambulatory Visit: Payer: Self-pay | Admitting: Cardiology

## 2019-10-12 ENCOUNTER — Other Ambulatory Visit: Payer: Self-pay | Admitting: Cardiology

## 2019-11-01 ENCOUNTER — Other Ambulatory Visit: Payer: Self-pay

## 2019-11-01 ENCOUNTER — Encounter (HOSPITAL_COMMUNITY): Payer: Self-pay

## 2019-11-01 ENCOUNTER — Emergency Department (HOSPITAL_COMMUNITY)
Admission: EM | Admit: 2019-11-01 | Discharge: 2019-11-01 | Disposition: A | Discharge status: Home / Self Care | Payer: Medicare HMO | Attending: Emergency Medicine | Admitting: Emergency Medicine

## 2019-11-01 ENCOUNTER — Emergency Department (HOSPITAL_COMMUNITY): Payer: Medicare HMO

## 2019-11-01 DIAGNOSIS — Z79899 Other long term (current) drug therapy: Secondary | ICD-10-CM | POA: Diagnosis not present

## 2019-11-01 DIAGNOSIS — Z87891 Personal history of nicotine dependence: Secondary | ICD-10-CM | POA: Insufficient documentation

## 2019-11-01 DIAGNOSIS — R2243 Localized swelling, mass and lump, lower limb, bilateral: Secondary | ICD-10-CM | POA: Diagnosis not present

## 2019-11-01 DIAGNOSIS — Z7901 Long term (current) use of anticoagulants: Secondary | ICD-10-CM | POA: Insufficient documentation

## 2019-11-01 DIAGNOSIS — I5032 Chronic diastolic (congestive) heart failure: Secondary | ICD-10-CM | POA: Diagnosis not present

## 2019-11-01 DIAGNOSIS — Z7984 Long term (current) use of oral hypoglycemic drugs: Secondary | ICD-10-CM | POA: Diagnosis not present

## 2019-11-01 DIAGNOSIS — I2510 Atherosclerotic heart disease of native coronary artery without angina pectoris: Secondary | ICD-10-CM | POA: Diagnosis not present

## 2019-11-01 DIAGNOSIS — D696 Thrombocytopenia, unspecified: Secondary | ICD-10-CM | POA: Diagnosis not present

## 2019-11-01 DIAGNOSIS — K625 Hemorrhage of anus and rectum: Secondary | ICD-10-CM | POA: Diagnosis not present

## 2019-11-01 DIAGNOSIS — I130 Hypertensive heart and chronic kidney disease with heart failure and stage 1 through stage 4 chronic kidney disease, or unspecified chronic kidney disease: Secondary | ICD-10-CM | POA: Diagnosis not present

## 2019-11-01 DIAGNOSIS — R0602 Shortness of breath: Secondary | ICD-10-CM | POA: Diagnosis not present

## 2019-11-01 DIAGNOSIS — N183 Chronic kidney disease, stage 3 unspecified: Secondary | ICD-10-CM | POA: Insufficient documentation

## 2019-11-01 DIAGNOSIS — E1122 Type 2 diabetes mellitus with diabetic chronic kidney disease: Secondary | ICD-10-CM | POA: Diagnosis not present

## 2019-11-01 DIAGNOSIS — I509 Heart failure, unspecified: Secondary | ICD-10-CM | POA: Diagnosis not present

## 2019-11-01 LAB — COMPREHENSIVE METABOLIC PANEL
ALT: 19 U/L (ref 0–44)
AST: 20 U/L (ref 15–41)
Albumin: 3.7 g/dL (ref 3.5–5.0)
Alkaline Phosphatase: 79 U/L (ref 38–126)
Anion gap: 12 (ref 5–15)
BUN: 24 mg/dL — ABNORMAL HIGH (ref 8–23)
CO2: 23 mmol/L (ref 22–32)
Calcium: 9.4 mg/dL (ref 8.9–10.3)
Chloride: 103 mmol/L (ref 98–111)
Creatinine, Ser: 1.79 mg/dL — ABNORMAL HIGH (ref 0.61–1.24)
GFR calc Af Amer: 43 mL/min — ABNORMAL LOW (ref >60)
GFR calc non Af Amer: 37 mL/min — ABNORMAL LOW (ref >60)
Glucose, Bld: 171 mg/dL — ABNORMAL HIGH (ref 70–99)
Potassium: 4.6 mmol/L (ref 3.5–5.1)
Sodium: 138 mmol/L (ref 135–145)
Total Bilirubin: 0.9 mg/dL (ref 0.3–1.2)
Total Protein: 7.3 g/dL (ref 6.5–8.1)

## 2019-11-01 LAB — CBC WITH DIFFERENTIAL/PLATELET
Abs Immature Granulocytes: 0.08 10*3/uL — ABNORMAL HIGH (ref 0.00–0.07)
Basophils Absolute: 0 10*3/uL (ref 0.0–0.1)
Basophils Relative: 0 %
Eosinophils Absolute: 0 10*3/uL (ref 0.0–0.5)
Eosinophils Relative: 0 %
HCT: 30.8 % — ABNORMAL LOW (ref 39.0–52.0)
Hemoglobin: 9.7 g/dL — ABNORMAL LOW (ref 13.0–17.0)
Immature Granulocytes: 2 %
Lymphocytes Relative: 22 %
Lymphs Abs: 1.2 10*3/uL (ref 0.7–4.0)
MCH: 36.6 pg — ABNORMAL HIGH (ref 26.0–34.0)
MCHC: 31.5 g/dL (ref 30.0–36.0)
MCV: 116.2 fL — ABNORMAL HIGH (ref 80.0–100.0)
Monocytes Absolute: 1.3 10*3/uL — ABNORMAL HIGH (ref 0.1–1.0)
Monocytes Relative: 24 %
Neutro Abs: 2.8 10*3/uL (ref 1.7–7.7)
Neutrophils Relative %: 52 %
Platelets: 129 10*3/uL — ABNORMAL LOW (ref 150–400)
RBC: 2.65 MIL/uL — ABNORMAL LOW (ref 4.22–5.81)
RDW: 14.6 % (ref 11.5–15.5)
WBC: 5.3 10*3/uL (ref 4.0–10.5)
nRBC: 0 % (ref 0.0–0.2)

## 2019-11-01 LAB — TROPONIN I (HIGH SENSITIVITY)
Troponin I (High Sensitivity): 13 ng/L (ref <18)
Troponin I (High Sensitivity): 12 ng/L (ref <18)

## 2019-11-01 LAB — TSH: TSH: 1.502 u[IU]/mL (ref 0.350–4.500)

## 2019-11-01 LAB — BRAIN NATRIURETIC PEPTIDE: B Natriuretic Peptide: 308 pg/mL — ABNORMAL HIGH (ref 0.0–100.0)

## 2019-11-01 MED ORDER — TORSEMIDE 20 MG PO TABS
40.0000 mg | ORAL_TABLET | Freq: Every day | ORAL | Status: DC
  Filled 2019-11-01 (×3): qty 2

## 2019-11-01 MED ORDER — HYDROCORTISONE ACETATE 25 MG RE SUPP: 25.0000 mg | Freq: Two times a day (BID) | RECTAL | 0 refills | Status: DC

## 2019-11-01 NOTE — ED Provider Notes (Signed)
Signed out by Dr Sabra Heck at 1600 to d/c to home if/when 2nd trop resulted, and normal.  Delta trop is normal, and not increased.   Pt requests d/c to home.   No current cp or discomfort. No increased wob.   Will d/c to home per pt request.   Rec outpt cardiology f/u.  Return precautions provided.      Lajean Saver, MD 11/01/19 [phone]

## 2019-11-01 NOTE — ED Triage Notes (Signed)
Pt to er, pt states that he was seen at the [facility] in [place] and was told that he had fluid around his heart and lungs.  Pt c/o sob.  Pt reports sob for the past 6-8 months.  Pt states that he also has some rectal bleeding when he goes to the bathroom reports bright red bleeding.  Pt reports some dizziness when standing

## 2019-11-01 NOTE — ED Notes (Signed)
Informed pt that this nurse was waiting on DEMADEX 40 mg tablet from pharmacy. PT refused pill stating "I only take 10 mg because I cannot handle the 20 mg I'm prescribed so I am surely not going to take 40." Pt states he will take DEMADEX tablet at home tonight before bed to avoid side effects of pill such as dizziness. Offered pt a urinal for home supply to avoid pt ambulating to restroom with dizziness. Educated pt on increased risk of fall after bedtime and that low platelets may contribute to worsening complications. Pt refused urinal stating he has them at home. Pt very resistant to D/C instruction education. Pt rude to this nurse and nurse Katie. Pt understands the appropriateness of taking medication as prescribed and states he will speak to his PCP.

## 2019-11-01 NOTE — Discharge Instructions (Addendum)
Please make sure you are taking your Torsemide - 20mg  twice daily - if you take this before bed, it may not make you as dizzy. Limit salt intake.   The heart clinic will make an appointment for you and let you know, please call tomorrow to have this arranged if you do not hear from them  If you should develop increasing or worsening shortness of breath or chest pain or swelling of her legs return to the hospital.  Please follow-up with the gastroenterologist regarding your hemorrhoids, I have given you their phone number above.  In the meantime you could use Anusol suppositories, twice daily for 2 weeks, keep your stool soft, if you have heavy bleeding come back to the hospital.  Also, your platelet count is low (129) - follow up with your doctor in the next couple weeks, discuss this with them as well.

## 2019-11-01 NOTE — ED Provider Notes (Signed)
[facility] EMERGENCY DEPARTMENT Provider Note   CSN: [id number] Arrival date & time: 11/01/19  1252     History Chief Complaint  Patient presents with  . Shortness of Breath    Jack Mejia is a 74 y.o. male.  HPI   This patient is a 74 year old male with a known multifocal coronary disease on maximal medication therapy, has a history of paroxysmal atrial fibrillation on multiple medications including amiodarone as well as beta-blockers.  He is also currently taking Eliquis.  He is followed for his congestive heart failure by Dr. Bronson Ing from the cardiology office.  The patient recently had his statin doubled to 20 mg of atorvastatin, he is on Eliquis 5 mg twice daily, he is also on torsemide for swelling.  He presents today in the care of his wife, he reports that he was at the [facility] [employer] office where he was told that due to his shortness of breath he probably had fluid around his heart and his lungs and it was recommended that he come to the hospital for evaluation.  I received no phone call prior to his arrival alerting [place] to these conditions.  The patient reports that the shortness of breath at this time is absent, sometimes it occurs when he walks and sometimes it does not, he is not having much in the way of coughing except when he first wakes up in the morning and brings up some phlegm.  He denies any significant swelling of his legs, has had no difficulty with headaches, visual changes, sore throat but does complain of some rectal bleeding.  He states that he sees some bright red blood on the paper when he wipes, his stool is usually brown and there is no blood in the commode when he has a bowel movement.  This has been going on for several weeks as well.  The patient is not very forthcoming with information and seems disinterested during the interview  Past Medical History:  Diagnosis Date  . Arthritis   . Atrial fibrillation ([place])    a. s/p DCCV in 03/2019   . CAD (coronary artery disease) 06/29/2019  . Cramps of left lower extremity   . Depression   . Diabetes mellitus    type 2 for 7-8 yrs  . Dyspnea   . Elevated liver enzymes   . Fatty liver   . History of kidney stones   . Hyperlipidemia   . Hypertension   . Vertigo     Patient Active Problem List   Diagnosis Date Noted  . Coronary artery disease involving native coronary artery of native heart without angina pectoris   . Acute-on-chronic kidney injury ([place]) 04/21/2019  . Acute on chronic diastolic (congestive) heart failure ([place]) 04/15/2019  . Atrial fibrillation with rapid ventricular response ([place]) 02/21/2019  . Depression, major, single episode, mild ([place]) 03/01/2017  . Lumbar stenosis with neurogenic claudication 08/06/2016  . Erectile dysfunction 12/05/2012  . Sensory neuropathy 12/05/2012  . Hyperlipidemia LDL goal <100 12/05/2012  . Rectal bleeding 10/13/2012  . Hypertension 07/07/2011  . Diabetes mellitus ([place]) 07/07/2011  . Fatty liver 07/07/2011    Past Surgical History:  Procedure Laterality Date  . CARDIOVERSION N/A 04/21/2019   Procedure: CARDIOVERSION;  Surgeon: Sanda Klein, MD;  Location: [facility];  Service: Cardiovascular;  Laterality: N/A;  . CARPAL TUNNEL RELEASE     both wrist  . cataract surgery     bilateral  . CHOLECYSTECTOMY    . EYE SURGERY    .  HEMORROIDECTOMY    . LEFT HEART CATH AND CORONARY ANGIOGRAPHY N/A 05/23/2019   Procedure: LEFT HEART CATH AND CORONARY ANGIOGRAPHY;  Surgeon: Belva Crome, MD;  Location: [place] [facility];  Service: Cardiovascular;  Laterality: N/A;  . LUMBAR LAMINECTOMY/DECOMPRESSION MICRODISCECTOMY N/A 08/06/2016   Procedure: LUMBAR THREE- LUMBAR FIVE  DECOMPRESSIVE LUMBAR LAMINECTOMY;  Surgeon: Jovita Gamma, MD;  Location: [place];  Service: Neurosurgery;  Laterality: N/A;       Family History  Problem Relation Age of Onset  . Diabetes Mother   . Cancer Mother   . Diabetes Sister   . Diabetes  Maternal Grandmother     Social History   Tobacco Use  . Smoking status: Former Smoker    Types: Cigarettes    Quit date: 10/14/1982    Years since quitting: 37.0  . Smokeless tobacco: Never Used  . Tobacco comment: 29 yrs ago  Substance Use Topics  . Alcohol use: No  . Drug use: No    Home Medications Prior to Admission medications   Medication Sig Start Date End Date Taking? Authorizing Provider  amiodarone (PACERONE) 200 MG tablet Take 1 tablet (200 mg total) by mouth daily. 08/24/19  Yes Strader, [place] M, PA-C  apixaban (ELIQUIS) 5 MG TABS tablet Take 1 tablet (5 mg total) by mouth 2 (two) times daily. 10/03/19  Yes Herminio Commons, MD  atorvastatin (LIPITOR) 20 MG tablet Take 1 tablet (20 mg total) by mouth daily. 10/03/19 09/27/20 Yes Herminio Commons, MD  augmented betamethasone dipropionate (DIPROLENE-AF) 0.05 % cream Apply 1 application topically 2 (two) times daily. As needed. 06/28/19  Yes [provider]  cimetidine (TAGAMET) 400 MG tablet Take 400 mg by mouth 3 (three) times daily. 07/20/19  Yes [provider]  Cyanocobalamin (VITAMIN B 12 PO) Take 2,500 mcg by mouth daily.   Yes [provider]  diltiazem (CARDIZEM CD) 360 MG 24 hr capsule TAKE 1 CAPSULE BY MOUTH EVERY DAY 10/11/19  Yes Herminio Commons, MD  famotidine (PEPCID) 40 MG tablet Take 1 tablet (40 mg total) by mouth daily. 08/30/19  Yes [name], MD  glipiZIDE (GLUCOTROL) 5 MG tablet Take 2 tablets (10 mg total) by mouth 2 (two) times daily. 08/30/19  Yes [name], MD  metFORMIN (GLUCOPHAGE) 500 MG tablet Take 2 tablets (1,000 mg total) by mouth 2 (two) times daily. 08/30/19  Yes [name], MD  metoprolol tartrate (LOPRESSOR) 25 MG tablet TAKE 1 TABLET BY MOUTH TWICE A DAY 10/12/19  Yes Herminio Commons, MD  Omega-3 Fatty Acids (OMEGA-3 FISH OIL PO) Take 1 capsule by mouth daily.    Yes [provider]  torsemide (DEMADEX) 20 MG tablet Take 0.5  tablets (10 mg total) by mouth daily. 08/24/19 08/18/20 Yes Strader, Fransisco Hertz, PA-C  hydrocortisone (ANUSOL-HC) 25 MG suppository Place 1 suppository (25 mg total) rectally 2 (two) times daily. 11/01/19   Noemi Chapel, MD    Allergies    Demerol, Vasotec [enalapril], and Lasix [furosemide]  Review of Systems   Review of Systems  All other systems reviewed and are negative.   Physical Exam Updated Vital Signs BP 135/64   Pulse (!) 57   Temp 97.6 F (36.4 C) (Oral)   Resp 13   Ht 1.778 m (5\' 10" )   Wt 95.7 kg   SpO2 91%   BMI 30.28 kg/m   Physical Exam Vitals and nursing note reviewed.  Constitutional:      General:  He is not in acute distress.    Appearance: He is well-developed.  HENT:     Head: Normocephalic and atraumatic.     Mouth/Throat:     Pharynx: No oropharyngeal exudate.  Eyes:     General: No scleral icterus.       Right eye: No discharge.        Left eye: No discharge.     Conjunctiva/sclera: Conjunctivae normal.     Pupils: Pupils are equal, round, and reactive to light.  Neck:     Thyroid: No thyromegaly.     Vascular: No JVD.  Cardiovascular:     Rate and Rhythm: Normal rate and regular rhythm.     Heart sounds: Normal heart sounds. No murmur. No friction rub. No gallop.   Pulmonary:     Effort: Pulmonary effort is normal. No respiratory distress.     Breath sounds: Normal breath sounds. No wheezing or rales.     Comments: This patient has no respiratory distress, speaks in full sentences, no expiratory wheezing or inspiratory rales Abdominal:     General: Bowel sounds are normal. There is no distension.     Palpations: Abdomen is soft. There is no mass.     Tenderness: There is no abdominal tenderness.  Musculoskeletal:        General: No tenderness. Normal range of motion.     Cervical back: Normal range of motion and neck supple.     Right lower leg: Edema present.     Left lower leg: Edema present.     Comments: Scant symmetrical minimal  pitting edema to the bilateral lower extremities in the pretibial area.  Lymphadenopathy:     Cervical: No cervical adenopathy.  Skin:    General: Skin is warm and dry.     Findings: No erythema or rash.  Neurological:     Mental Status: He is alert.     Coordination: Coordination normal.     Comments: No facial droop, speaks in full sentences, has bilateral hearing aids, hears fine with this, follows commands without difficulty  Psychiatric:        Behavior: Behavior normal.     ED Results / Procedures / Treatments   Labs (all labs ordered are listed, but only abnormal results are displayed) Labs Reviewed  CBC WITH DIFFERENTIAL/PLATELET - Abnormal; Notable for the following components:      Result Value   RBC 2.65 (*)    Hemoglobin 9.7 (*)    HCT 30.8 (*)    MCV 116.2 (*)    MCH 36.6 (*)    Platelets 129 (*)    Monocytes Absolute 1.3 (*)    Abs Immature Granulocytes 0.08 (*)    All other components within normal limits  COMPREHENSIVE METABOLIC PANEL - Abnormal; Notable for the following components:   Glucose, Bld 171 (*)    BUN 24 (*)    Creatinine, Ser 1.79 (*)    GFR calc non Af Amer 37 (*)    GFR calc Af Amer 43 (*)    All other components within normal limits  BRAIN NATRIURETIC PEPTIDE - Abnormal; Notable for the following components:   B Natriuretic Peptide 308.0 (*)    All other components within normal limits  TSH  OCCULT BLOOD X 1 CARD TO LAB, STOOL  TROPONIN I (HIGH SENSITIVITY)  TROPONIN I (HIGH SENSITIVITY)    EKG EKG Interpretation  Date/Time:  Wednesday November 01 2019 13:03:58 EDT Ventricular Rate:  55 PR Interval:  206 QRS Duration: 78 QT Interval:  498 QTC Calculation: 476 R Axis:   16 Text Interpretation: Sinus bradycardia Nonspecific T wave abnormality Prolonged QT Abnormal ECG Since last tracing rate slower Confirmed by Noemi Chapel [phone]) on 11/01/2019 1:12:12 PM   Radiology DG Chest Port 1 View  Result Date: 11/01/2019 CLINICAL DATA:   Shortness of breath. Technologist notes state patient with seen at [facility] in [place] and told he had fluid around heart and lungs. EXAM: PORTABLE CHEST 1 VIEW COMPARISON:  Most recent available comparison 04/19/2019 FINDINGS: Chronic cardiomegaly, unchanged. Unchanged mediastinal contours with aortic atherosclerosis. Small bilateral pleural effusions. Vascular congestion without overt pulmonary edema. No confluent airspace disease. No pneumothorax. Chronic change about the right shoulder. IMPRESSION: 1. Stable cardiomegaly. Small bilateral pleural effusions. Vascular congestion without overt pulmonary edema. 2.  Aortic Atherosclerosis (ICD10-I70.0). Electronically Signed   By: Keith Rake M.D.   On: 11/01/2019 15:07    Procedures Procedures (including critical care time)  Medications Ordered in ED Medications  torsemide (DEMADEX) tablet 40 mg (has no administration in time range)    ED Course  I have reviewed the triage vital signs and the nursing notes.  Pertinent labs & imaging results that were available during my care of the patient were reviewed by me and considered in my medical decision making (see chart for details).    MDM Rules/Calculators/A&P                       This patient presents to the ED for concern of shortness of breath and rectal bleeding, this involves an extensive number of treatment options, and is a complaint that carries with it a high risk of complications and morbidity.  The differential diagnosis includes congestive heart failure, pneumonia, pleural effusion, less likely to be COPD in the absence of history of tobacco use or history of reactive airway disease.  Less likely to be pulmonary embolism given no hypoxia or tachycardia and no chest pain.  Would also consider hemorrhoids internal or external or fissure for cause of bleeding, the shortness of breath on exertion could be related to anemia as well though he does not appear overtly anemic and again is not  hypotensive or tachycardic.   Lab Tests:   I Ordered, reviewed, and interpreted labs, which included CBC, metabolic panel, troponin, Hemoccult, BNP  Medicines ordered:   I ordered medication Lasix  For SOB / edema   Imaging Studies ordered:   I ordered imaging studies which included portable chest x-ray and  I independently visualized and interpreted imaging which showed no signs of significant pulmonary edema cardiomegaly pleural effusions or pneumonia, no pulmonary infiltrates or pneumothorax  Additional history obtained:   Additional history obtained from medical record and the spouse, the patient has recently visited with his cardiologist Dr. Bronson Ing and had some minor medical medication changes  Previous records obtained and reviewed   Consultations Obtained:   I consulted Dr. Harl Bowie with cardiology and discussed lab and imaging findings, he recommends that the patient should have an intravenous dose of Lasix and as long as he is not hypoxic he can be outpatient management with close follow-up and he will have the office arrange this.  We can increase the dose of the torsemide as well.  Second troponin pending  Reevaluation:  After the interventions stated above, I reevaluated the patient and found the patient to be stable  The patient underwent endoscopy in the emergency department, there was a sign of  some internal hemorrhoids, nothing was actively bleeding in the stool in the rectal vault was light brown to mucoid and faintly Hemoccult positive.  CBC shows no significant decrease in hemoglobin, mild anemia is chronic over time.  At change of shift - care signed out to Dr. Ashok Cordia pending second troponin.  Critical Interventions:  . W/u for CHF.   . Work-up for rectal bleeding, likely hemorrhoid   Final Clinical Impression(s) / ED Diagnoses Final diagnoses:  Congestive heart failure, unspecified HF chronicity, unspecified heart failure type (HCC)  SOB  (shortness of breath)    Rx / DC Orders ED Discharge Orders         Ordered    hydrocortisone (ANUSOL-HC) 25 MG suppository  2 times daily     11/01/19 1547           Noemi Chapel, MD 11/03/19 1148

## 2019-11-09 ENCOUNTER — Telehealth: Payer: Self-pay

## 2019-11-27 ENCOUNTER — Ambulatory Visit (INDEPENDENT_AMBULATORY_CARE_PROVIDER_SITE_OTHER): Payer: Medicare HMO | Admitting: Family Medicine

## 2019-11-27 ENCOUNTER — Encounter: Payer: Self-pay | Admitting: Family Medicine

## 2019-11-27 ENCOUNTER — Other Ambulatory Visit: Payer: Self-pay

## 2019-11-27 VITALS — BP 122/82 | Temp 97.5°F | Ht 70.0 in | Wt 207.4 lb

## 2019-11-27 DIAGNOSIS — I509 Heart failure, unspecified: Secondary | ICD-10-CM

## 2019-11-27 DIAGNOSIS — D649 Anemia, unspecified: Secondary | ICD-10-CM | POA: Diagnosis not present

## 2019-11-27 DIAGNOSIS — I10 Essential (primary) hypertension: Secondary | ICD-10-CM | POA: Diagnosis not present

## 2019-11-27 DIAGNOSIS — Z1211 Encounter for screening for malignant neoplasm of colon: Secondary | ICD-10-CM

## 2019-11-27 DIAGNOSIS — D539 Nutritional anemia, unspecified: Secondary | ICD-10-CM | POA: Diagnosis not present

## 2019-11-27 DIAGNOSIS — E118 Type 2 diabetes mellitus with unspecified complications: Secondary | ICD-10-CM | POA: Diagnosis not present

## 2019-11-27 DIAGNOSIS — R5383 Other fatigue: Secondary | ICD-10-CM

## 2019-11-27 DIAGNOSIS — E119 Type 2 diabetes mellitus without complications: Secondary | ICD-10-CM | POA: Diagnosis not present

## 2019-11-27 NOTE — Progress Notes (Signed)
Subjective:  Patient presents supposedly for wellness plus chronic but has so many chronic concerns that wellness is delayed  Patient ID: Jack Mejia, male    DOB: 02-Oct-1945, 74 y.o.   MRN: [id number]  HPI  The patient comes in today for a wellness visit.  Results for orders placed or performed during the hospital encounter of 11/01/19  CBC with Differential/Platelet  Result Value Ref Range   WBC 5.3 4.0 - 10.5 K/uL   RBC 2.65 (L) 4.22 - 5.81 MIL/uL   Hemoglobin 9.7 (L) 13.0 - 17.0 g/dL   HCT 30.8 (L) 39.0 - 52.0 %   MCV 116.2 (H) 80.0 - 100.0 fL   MCH 36.6 (H) 26.0 - 34.0 pg   MCHC 31.5 30.0 - 36.0 g/dL   RDW 14.6 11.5 - 15.5 %   Platelets 129 (L) 150 - 400 K/uL   nRBC 0.0 0.0 - 0.2 %   Neutrophils Relative % 52 %   Neutro Abs 2.8 1.7 - 7.7 K/uL   Lymphocytes Relative 22 %   Lymphs Abs 1.2 0.7 - 4.0 K/uL   Monocytes Relative 24 %   Monocytes Absolute 1.3 (H) 0.1 - 1.0 K/uL   Eosinophils Relative 0 %   Eosinophils Absolute 0.0 0.0 - 0.5 K/uL   Basophils Relative 0 %   Basophils Absolute 0.0 0.0 - 0.1 K/uL   Immature Granulocytes 2 %   Abs Immature Granulocytes 0.08 (H) 0.00 - 0.07 K/uL  Comprehensive metabolic panel  Result Value Ref Range   Sodium 138 135 - 145 mmol/L   Potassium 4.6 3.5 - 5.1 mmol/L   Chloride 103 98 - 111 mmol/L   CO2 23 22 - 32 mmol/L   Glucose, Bld 171 (H) 70 - 99 mg/dL   BUN 24 (H) 8 - 23 mg/dL   Creatinine, Ser 1.79 (H) 0.61 - 1.24 mg/dL   Calcium 9.4 8.9 - 10.3 mg/dL   Total Protein 7.3 6.5 - 8.1 g/dL   Albumin 3.7 3.5 - 5.0 g/dL   AST 20 15 - 41 U/L   ALT 19 0 - 44 U/L   Alkaline Phosphatase 79 38 - 126 U/L   Total Bilirubin 0.9 0.3 - 1.2 mg/dL   GFR calc non Af Amer 37 (L) >60 mL/min   GFR calc Af Amer 43 (L) >60 mL/min   Anion gap 12 5 - 15  Brain natriuretic peptide  Result Value Ref Range   B Natriuretic Peptide 308.0 (H) 0.0 - 100.0 pg/mL  TSH  Result Value Ref Range   TSH 1.502 0.350 - 4.500 uIU/mL  Troponin I (High  Sensitivity)  Result Value Ref Range   Troponin I (High Sensitivity) 13 <18 ng/L  Troponin I (High Sensitivity)  Result Value Ref Range   Troponin I (High Sensitivity) 12 <18 ng/L     A review of their health history was completed.  A review of medications was also completed.  Any needed refills; yes  Eating habits: eats whatever he wants  Falls/  MVA accidents in past few months: none  Regular exercise: none due to breathing  Specialist pt sees on regular basis: cardiologist  Preventative health issues were discussed.   Additional concerns: continues SOB- is it related to his heart condition or does he have a lung condition Needs handicap paper Patient having stomach pain  Abdominal pain is diffuse.  Has had bright red blood per stools.  Usually just a very small amount on the toilet paper.  Was  noted to have hemorrhoids in the emergency room.  Has not had a colonoscopy in a very long time  Patient forgot to mention nurse's emergency room just several weeks ago.  He had seen his [place] doctor.  They instructed to go right to the ER.  He elected to come back to his home seen  Full ER report reviewed and patient presents  Patient has shortness of breath.  BNP was in the upper 300s.  His chest x-ray showed vascular congestion.  The ER physician spoke with the cardiologist, who recommended IV diuretics, along with close follow-up with their cardiologist in the office  Patient states sugars overall has been running fair, numbers mostly in the mid 150s admits to poor dietary habits Review of Systems No headache no chest pain no shortness of breath    Objective:   Physical Exam  Alert quiet no acute distress.  Lungs rare basilar crackle.  No tachypnea heart regular rate and rhythm currently ankles no edema pulses present.  Sensation overall intact  Abdomen large no discrete tenderness.  No masses.  No rebound excellent bowel sounds  Patient declines prostate rectal exam       Assessment & Plan:  Impression 1 progressive dyspnea with evidence of CHF at recent ER visit.  We will arrange visit back to his cardiologist.  Maintain same dose of medication for now.  Warning signs discussed.  2.  Chronic renal insufficiency.  This presents a challenge for the cardiologist clinic suggesting diuretics etc.  3.  Type 2 diabetes check A1c  4.  Blood per rectum nonspecific abdominal symptoms.  Patient feels well that he is not a great historian.  Will work on GI referral.  5.  Anemia.  Curious.  MCV quite high.  Accompanied by low platelets will check P23 and folic acid.  With patient's dyspnea great challenge this may be contributing, will get a hematologist on this  Greater than 50% of this 30 minute face to face visit was spent in counseling and discussion and coordination of care regarding the above diagnosis/diagnosies

## 2019-11-28 LAB — FOLATE: Folate: 13.1 ng/mL (ref >3.0)

## 2019-11-28 LAB — HEMOGLOBIN A1C
Est. average glucose Bld gHb Est-mCnc: 157 mg/dL
Hgb A1c MFr Bld: 7.1 % — ABNORMAL HIGH (ref 4.8–5.6)

## 2019-11-28 LAB — VITAMIN B12: Vitamin B-12: >2000 pg/mL — ABNORMAL HIGH (ref 232–1245)

## 2019-12-01 ENCOUNTER — Telehealth (INDEPENDENT_AMBULATORY_CARE_PROVIDER_SITE_OTHER): Payer: Medicare HMO | Admitting: Cardiovascular Disease

## 2019-12-01 ENCOUNTER — Encounter: Payer: Self-pay | Admitting: Cardiovascular Disease

## 2019-12-01 VITALS — BP 164/74 | HR 70 | Ht 70.0 in | Wt 207.0 lb

## 2019-12-01 DIAGNOSIS — I10 Essential (primary) hypertension: Secondary | ICD-10-CM | POA: Diagnosis not present

## 2019-12-01 DIAGNOSIS — I5032 Chronic diastolic (congestive) heart failure: Secondary | ICD-10-CM | POA: Diagnosis not present

## 2019-12-01 DIAGNOSIS — D649 Anemia, unspecified: Secondary | ICD-10-CM | POA: Diagnosis not present

## 2019-12-01 DIAGNOSIS — I480 Paroxysmal atrial fibrillation: Secondary | ICD-10-CM | POA: Diagnosis not present

## 2019-12-01 DIAGNOSIS — N183 Chronic kidney disease, stage 3 unspecified: Secondary | ICD-10-CM

## 2019-12-01 DIAGNOSIS — R0600 Dyspnea, unspecified: Secondary | ICD-10-CM

## 2019-12-01 DIAGNOSIS — I25118 Atherosclerotic heart disease of native coronary artery with other forms of angina pectoris: Secondary | ICD-10-CM

## 2019-12-01 DIAGNOSIS — Z79899 Other long term (current) drug therapy: Secondary | ICD-10-CM | POA: Diagnosis not present

## 2019-12-01 DIAGNOSIS — R0609 Other forms of dyspnea: Secondary | ICD-10-CM

## 2019-12-01 MED ORDER — TORSEMIDE 20 MG PO TABS: 20.0000 mg | ORAL_TABLET | Freq: Two times a day (BID) | ORAL | 3 refills | Status: DC

## 2019-12-01 MED ORDER — TORSEMIDE 20 MG PO TABS: 10.0000 mg | ORAL_TABLET | Freq: Two times a day (BID) | ORAL | 3 refills | Status: DC

## 2019-12-01 NOTE — Addendum Note (Signed)
Addended by: Barbarann Ehlers A on: 12/01/2019 09:52 AM   Modules accepted: Orders

## 2019-12-01 NOTE — Patient Instructions (Addendum)
Medication Instructions: INCREASE Torsemide to 20 mg TWICE a day   Labwork: BMET in 2 weeks (5/21)  Procedures/Testing: None today  Follow-Up: 6 months Office visit with Altamese Cabal  Any Additional Special Instructions Will Be Listed Below (If Applicable).     If you need a refill on your cardiac medications before your next appointment, please call your pharmacy.      Thank you for choosing [facility] !

## 2019-12-01 NOTE — Progress Notes (Addendum)
Virtual Visit via Telephone Note   This visit type was conducted due to national recommendations for restrictions regarding the COVID-19 Pandemic (e.g. social distancing) in an effort to limit this patient's exposure and mitigate transmission in our community.  Due to his co-morbid illnesses, this patient is at least at moderate risk for complications without adequate follow up.  This format is felt to be most appropriate for this patient at this time.  The patient did not have access to video technology/had technical difficulties with video requiring transitioning to audio format only (telephone).  All issues noted in this document were discussed and addressed.  No physical exam could be performed with this format.  Please refer to the patient's chart for his  consent to telehealth for [facility]   The patient was identified using 2 identifiers.  Date:  12/01/2019   ID:  Jack Mejia, DOB 29-Sep-1945, MRN [id number]  Patient Location: Home Provider Location: Home  PCP:  [name], MD  Cardiologist:  [name], MD  Electrophysiologist:  None   Evaluation Performed:  Follow-Up Visit  Chief Complaint:  CHF  History of Present Illness:    Jack Mejia is a 74 y.o. male with paroxysmal atrial fibrillation, chronic diastolic heart failure, and coronary artery disease.  He also has hypertension, hyperlipidemia, type 2 diabetes mellitus, and chronic kidney disease stage III.  He was evaluated in the ED on 11/01/2019 for shortness of breath. I personally viewed all relevant documentation, labs, and studies.  He was found to be anemic with hemoglobin of 9.7. Creatinine was 1.79. BNP was 308. Troponin was normal. Chest x-ray showed some vascular congestion and small bilateral pleural effusions. He was given IV Lasix. He was also found to have hemorrhoids.  He previously told me that he feels best when systolic blood pressure is 150 and he does not feel good when it is  under 130.  He gets short of breath when he walks 50-75 yards.  He urinates every 1.5-2 hours. This occurs even at night. He takes 5 mg of torsemide BID.  He denies chest pain and palpitations.   Past Medical History:  Diagnosis Date  . Arthritis   . Atrial fibrillation ([place])    a. s/p DCCV in 03/2019  . CAD (coronary artery disease) 06/29/2019  . Cramps of left lower extremity   . Depression   . Diabetes mellitus    type 2 for 7-8 yrs  . Dyspnea   . Elevated liver enzymes   . Fatty liver   . History of kidney stones   . Hyperlipidemia   . Hypertension   . Vertigo    Past Surgical History:  Procedure Laterality Date  . CARDIOVERSION N/A 04/21/2019   Procedure: CARDIOVERSION;  Surgeon: Sanda Klein, MD;  Location: [facility];  Service: Cardiovascular;  Laterality: N/A;  . CARPAL TUNNEL RELEASE     both wrist  . cataract surgery     bilateral  . CHOLECYSTECTOMY    . EYE SURGERY    . HEMORROIDECTOMY    . LEFT HEART CATH AND CORONARY ANGIOGRAPHY N/A 05/23/2019   Procedure: LEFT HEART CATH AND CORONARY ANGIOGRAPHY;  Surgeon: Belva Crome, MD;  Location: [place] [facility];  Service: Cardiovascular;  Laterality: N/A;  . LUMBAR LAMINECTOMY/DECOMPRESSION MICRODISCECTOMY N/A 08/06/2016   Procedure: LUMBAR THREE- LUMBAR FIVE  DECOMPRESSIVE LUMBAR LAMINECTOMY;  Surgeon: Jovita Gamma, MD;  Location: [place];  Service: Neurosurgery;  Laterality: N/A;     Current Meds  Medication Sig  . amiodarone (PACERONE) 200 MG tablet Take 1 tablet (200 mg total) by mouth daily.  [name] apixaban (ELIQUIS) 5 MG TABS tablet Take 1 tablet (5 mg total) by mouth 2 (two) times daily.  [name] atorvastatin (LIPITOR) 20 MG tablet Take 1 tablet (20 mg total) by mouth daily.  [name] augmented betamethasone dipropionate (DIPROLENE-AF) 0.05 % cream Apply 1 application topically 2 (two) times daily. As needed.  . cimetidine (TAGAMET) 400 MG tablet Take 400 mg by mouth 3 (three) times daily.  . Cyanocobalamin  (VITAMIN B 12 PO) Take 2,500 mcg by mouth daily.  [name] diltiazem (CARDIZEM CD) 360 MG 24 hr capsule TAKE 1 CAPSULE BY MOUTH EVERY DAY  . famotidine (PEPCID) 40 MG tablet Take 1 tablet (40 mg total) by mouth daily.  [name] glipiZIDE (GLUCOTROL) 5 MG tablet Take 2 tablets (10 mg total) by mouth 2 (two) times daily.  . hydrocortisone (ANUSOL-HC) 25 MG suppository Place 1 suppository (25 mg total) rectally 2 (two) times daily.  . metFORMIN (GLUCOPHAGE) 500 MG tablet Take 2 tablets (1,000 mg total) by mouth 2 (two) times daily.  . metoprolol tartrate (LOPRESSOR) 25 MG tablet TAKE 1 TABLET BY MOUTH TWICE A DAY  . Omega-3 Fatty Acids (OMEGA-3 FISH OIL PO) Take 1 capsule by mouth daily.   [name] torsemide (DEMADEX) 20 MG tablet Take 0.5 tablets (10 mg total) by mouth daily.     Allergies:   Demerol, Vasotec [enalapril], and Lasix [furosemide]   Social History   Tobacco Use  . Smoking status: Former Smoker    Types: Cigarettes    Quit date: 10/14/1982    Years since quitting: 37.1  . Smokeless tobacco: Never Used  . Tobacco comment: 29 yrs ago  Substance Use Topics  . Alcohol use: No  . Drug use: No     Family Hx: The patient's family history includes Cancer in his mother; Diabetes in his maternal grandmother, mother, and sister.  ROS:   Please see the history of present illness.     All other systems reviewed and are negative.   Prior CV studies:   The following studies were reviewed today:  Cardiac catheterization 05/23/2019:   Normal left main  30 to 40% proximal LAD, first diagonal 50% proximal and 70% distal, and diffuse 70% narrowing in the distal/apical LAD.  First obtuse marginal large in size with proximal segmental 50% narrowing and distal 90% narrowing.  Dominant RCA with 30 to 40% ostial to proximal narrowing, diffuse disease in the ostial to mid PDA 80 to 90%, and 80% segmental proximal second left ventricular branch.  Previous echo documentation of EF greater than 55%.  After hydration today LVEDP 26 mmHg.  RECOMMENDATIONS:   Diffuse, predominantly distal coronary artery disease involving PDA, left ventricular branch, diagonal, distal LAD, and first obtuse marginal. Considering multifocality medical therapy is most appropriate especially in absence of any proximal vessel disease. PCI is possible on the PDA, left ventricular branch, and obtuse marginal but each would require significant contrast and place the patient at risk for acute kidney injury.  Significant elevation in LVEDP and history of orthopnea at home suggest that diastolic heart failure needs therapy with additional diuresis.    Echocardiogram 02/22/2019:  1. The left ventricle has normal systolic function with an ejection  fraction of 60-65%. The cavity size was normal. There is moderately  increased left ventricular wall thickness. Left ventricular diastolic  Doppler parameters are indeterminate.  2. The right ventricle has normal systolic function.  The cavity was  normal. There is no increase in right ventricular wall thickness.  3. No evidence of mitral valve stenosis.  4. The aortic valve is tricuspid. Mild thickening of the aortic valve.  Mild calcification of the aortic valve. No stenosis of the aortic valve.  Mild aortic annular calcification noted.  5. The aorta is normal in size and structure.  6. The aortic root is normal in size and structure.  7. Pulmonary hypertension is indeterminate, inadequate TR jet.    Labs/Other Tests and Data Reviewed:    EKG:  An ECG dated 11/01/2019 was personally reviewed today and demonstrated:  Sinus rhythm with nonspecific T wave abnormalities.  Recent Labs: 04/17/2019: Magnesium 1.8 11/01/2019: ALT 19; B Natriuretic Peptide 308.0; BUN 24; Creatinine, Ser 1.79; Hemoglobin 9.7; Platelets 129; Potassium 4.6; Sodium 138; TSH 1.502   Recent Lipid Panel Lab Results  Component Value Date/Time   CHOL 173 05/04/2018 10:56 AM   TRIG 278  (H) 05/04/2018 10:56 AM   HDL 34 (L) 05/04/2018 10:56 AM   CHOLHDL 5.1 (H) 05/04/2018 10:56 AM   CHOLHDL 4.4 02/21/2014 07:03 AM   LDLCALC 83 05/04/2018 10:56 AM    Wt Readings from Last 3 Encounters:  12/01/19 207 lb (93.9 kg)  11/27/19 207 lb 6.4 oz (94.1 kg)  11/01/19 211 lb (95.7 kg)     Objective:    Vital Signs:  BP (!) 164/74   Pulse 70   Ht 5\' 10"  (1.778 m)   Wt 207 lb (93.9 kg)   BMI 29.70 kg/m    VITAL SIGNS:  reviewed  ASSESSMENT & PLAN:    1.  Coronary artery disease: Cardiac catheterization results reviewed above.  Denies anginal symptoms.  Continue medical therapy with atorvastatin and beta-blocker.  No aspirin as he is on Eliquis.  2.  Chronic diastolic heart failure: Chronic exertional dyspnea stable.  This is likely both due to chronic diastolic heart failure and anemia with hemoglobin of 9.7 (see discussion below).  I will increase torsemide to 10 mg twice daily (he prefers it this way). I will obtain a BMET in 2 weeks.  3.  Paroxysmal atrial fibrillation: He underwent DCCV on 04/21/2019.  He remains on amiodarone, diltiazem, and metoprolol.  Symptomatically stable.  Continue Eliquis for anticoagulation.  4.  Chronic kidney disease stage III: Creatinine 1.79 on 11/01/2019.  I will repeat a basic metabolic panel in 2 weeks as I am increasing the dose of torsemide to 10 mg twice daily.  5.  Hypertension: BP is elevated.  See discussion above.  He feels best when SBP is 150.  He does not feel well when it is under 130.  He will continue to monitor this at home.  He said his home cuff always gives a systolic reading in the 209 range but when it was checked at his PCPs office on 11/27/2019, he says it was 122/80.  6.  Anemia: He has been referred to hematology by his PCP.  Hemoglobin 9.7 on 11/01/2019.  This is also contributing to exertional dyspnea.     COVID-19 Education: The signs and symptoms of COVID-19 were discussed with the patient and how to seek care for  testing (follow up with PCP or arrange E-visit).  The importance of social distancing was discussed today.  Time:   Today, I have spent 40 minutes with the patient with telehealth technology discussing the above problems.     Medication Adjustments/Labs and Tests Ordered: Current medicines are reviewed at length with  the patient today.  Concerns regarding medicines are outlined above.   Tests Ordered: No orders of the defined types were placed in this encounter.   Medication Changes: No orders of the defined types were placed in this encounter.   Follow Up:  In Person in 6 month(s) with APP  Signed, [name], MD  12/01/2019 9:07 AM    [facility]   Addendum: The patient called [place] back to inform us he is actually already taking 10 mg twice daily.  I will thus increase torsemide to 20 mg twice daily.

## 2019-12-01 NOTE — Addendum Note (Signed)
Addended by: Barbarann Ehlers A on: 12/01/2019 09:23 AM   Modules accepted: Orders

## 2019-12-03 ENCOUNTER — Encounter: Payer: Self-pay | Admitting: Family Medicine

## 2019-12-04 ENCOUNTER — Other Ambulatory Visit: Payer: Self-pay | Admitting: Cardiology

## 2019-12-06 ENCOUNTER — Encounter: Payer: Self-pay | Admitting: Family Medicine

## 2019-12-06 ENCOUNTER — Encounter (INDEPENDENT_AMBULATORY_CARE_PROVIDER_SITE_OTHER): Payer: Self-pay | Admitting: *Deleted

## 2019-12-12 ENCOUNTER — Encounter: Payer: Self-pay | Admitting: Family Medicine

## 2019-12-12 ENCOUNTER — Other Ambulatory Visit: Payer: Self-pay

## 2019-12-12 ENCOUNTER — Ambulatory Visit (INDEPENDENT_AMBULATORY_CARE_PROVIDER_SITE_OTHER): Payer: Medicare HMO | Admitting: Family Medicine

## 2019-12-12 ENCOUNTER — Ambulatory Visit (HOSPITAL_COMMUNITY)
Admission: RE | Admit: 2019-12-12 | Discharge: 2019-12-12 | Disposition: A | Discharge status: Home / Self Care | Payer: Medicare HMO | Source: Ambulatory Visit | Attending: Family Medicine | Admitting: Family Medicine

## 2019-12-12 VITALS — BP 144/72 | HR 78 | Temp 97.6°F | Ht 70.0 in | Wt 211.4 lb

## 2019-12-12 DIAGNOSIS — I509 Heart failure, unspecified: Secondary | ICD-10-CM

## 2019-12-12 DIAGNOSIS — Z125 Encounter for screening for malignant neoplasm of prostate: Secondary | ICD-10-CM | POA: Diagnosis not present

## 2019-12-12 DIAGNOSIS — R0602 Shortness of breath: Secondary | ICD-10-CM

## 2019-12-12 DIAGNOSIS — Z0000 Encounter for general adult medical examination without abnormal findings: Secondary | ICD-10-CM

## 2019-12-12 NOTE — Progress Notes (Signed)
   Subjective:    Patient ID: Jack Mejia, male    DOB: [birth year], 74 y.o.   MRN: [id number]  HPI AWV- Annual Wellness Visit  The patient was seen for their annual wellness visit. The patient's past medical history, surgical history, and family history were reviewed. Pertinent vaccines were reviewed ( tetanus, pneumonia, shingles, flu) The patient's medication list was reviewed and updated.  The height and weight were entered.  BMI recorded in electronic record elsewhere  Cognitive screening was completed. Outcome of Mini - Cog: Intact   Falls /depression screening electronically recorded within record elsewhere  Current tobacco usage: none (All patients who use tobacco were given written and verbal information on quitting)  Recent listing of emergency department/hospitalizations over the past year were reviewed.  current specialist the patient sees on a regular basis: pass   Medicare annual wellness visit patient questionnaire was reviewed.  A written screening schedule for the patient for the next 5-10 years was given. Appropriate discussion of followup regarding next visit was discussed.  PHQ9 done. Pt has had transient thoughts of feeling down.  No active suicidal thoughts.  Definitely wants no medication in this regard.  States he has felt this way for many years and he is used to it.  States he would not harm himself  Still notes substantial fatigue and tiredness  Visit with the hematologist pending  Review of cardiologist notes showed they have adjusted his diuretic upward.  Notes ongoing shortness of breath with any type of exertion.  No chest pain.  No substantial orthopnea     Review of Systems No headache, no major weight loss or weight gain, no chest pain no back pain abdominal pain no change in bowel habits complete ROS otherwise negative     Objective:   Physical Exam  Alert and oriented, vitals reviewed and stable, NAD ENT-TM's and ext canals WNL  bilat via otoscopic exam Soft palate, tonsils and post pharynx WNL via oropharyngeal exam Neck-symmetric, no masses; thyroid nonpalpable and nontender Pulmonary-no tachypnea or accessory muscle use; perhaps slight crackles at base no tachypnea no distress Clear without wheezes via auscultation Card--no abnrml murmurs, rhythm reg and rate WNL Carotid pulses symmetric, without bruits Patient declines prostate exam  Feet.  Pulses intact sensation perhaps slightly diminished distal plantar surface.      Assessment & Plan:  Impression 1 wellness exam.  Diet discussed.  Exercise discussed.  Patient declines prostate exam.  Declines colonoscopy referral with ongoing shortness of breath we will go ahead and set up blood work and x-ray to reassess CHF status.  Patient may need to get back to see cardiologist sooner than the 60-month visit they plan..  Further recommendations based on blood work.  Follow-up every 6 months

## 2019-12-13 ENCOUNTER — Encounter (HOSPITAL_COMMUNITY): Payer: Self-pay

## 2019-12-13 ENCOUNTER — Other Ambulatory Visit: Payer: Self-pay

## 2019-12-13 LAB — BASIC METABOLIC PANEL
BUN/Creatinine Ratio: 15 (ref 10–24)
BUN: 36 mg/dL — ABNORMAL HIGH (ref 8–27)
CO2: 23 mmol/L (ref 20–29)
Calcium: 8.9 mg/dL (ref 8.6–10.2)
Chloride: 96 mmol/L (ref 96–106)
Creatinine, Ser: 2.47 mg/dL — ABNORMAL HIGH (ref 0.76–1.27)
GFR calc Af Amer: 29 mL/min/{1.73_m2} — ABNORMAL LOW (ref >59)
GFR calc non Af Amer: 25 mL/min/{1.73_m2} — ABNORMAL LOW (ref >59)
Glucose: 318 mg/dL — ABNORMAL HIGH (ref 65–99)
Potassium: 4.1 mmol/L (ref 3.5–5.2)
Sodium: 137 mmol/L (ref 134–144)

## 2019-12-13 LAB — PSA: Prostate Specific Ag, Serum: 0.7 ng/mL (ref 0.0–4.0)

## 2019-12-13 LAB — BRAIN NATRIURETIC PEPTIDE: BNP: 111.8 pg/mL — ABNORMAL HIGH (ref 0.0–100.0)

## 2019-12-13 NOTE — Progress Notes (Signed)
CONSULT NOTE  Patient Care Team: [name], MD as PCP - General (Family Medicine) Herminio Commons, MD as PCP - Cardiology (Cardiology)  CHIEF COMPLAINTS/PURPOSE OF CONSULTATION:  Macrocytic anemia.  HISTORY OF PRESENTING ILLNESS:  Jack Mejia 74 y.o. male is here because of his recent anemia  He was found to have abnormal CBC from 11/27/2019. He notes that he sees blood on his toilet tissue at least once every day. He notes that he was told he had a stomach ulcer by a doctor, but he doesn't remember which one. He has been taking Eliquis. His last colonoscopy was in 2011, and he notes that he does not want to have another one. He has some extreme shortness of breath on exertion and can only walk a short distance. He also had some difficulty sitting with a mask on. He has a prior history of atrial fibrillation.   He denies recent chest pain on exertion, pre-syncopal episodes, or palpitations. The patient denies over the counter NSAID ingestion. He is not on antiplatelets agents. He had no prior history or diagnosis of cancer. He denies any pica and eats a variety of diet. He never donated blood or received blood transfusion  MEDICAL HISTORY:  Past Medical History:  Diagnosis Date  . Arthritis   . Atrial fibrillation ([place])    a. s/p DCCV in 03/2019  . CAD (coronary artery disease) 06/29/2019  . Cramps of left lower extremity   . Depression   . Diabetes mellitus    type 2 for 7-8 yrs  . Dyspnea   . Elevated liver enzymes   . Fatty liver   . History of kidney stones   . Hyperlipidemia   . Hypertension   . Vertigo     SURGICAL HISTORY: Past Surgical History:  Procedure Laterality Date  . CARDIOVERSION N/A 04/21/2019   Procedure: CARDIOVERSION;  Surgeon: Sanda Klein, MD;  [facility];  Service: Cardiovascular;  Laterality: N/A;  . CARPAL TUNNEL RELEASE     both wrist  . cataract surgery     bilateral  . CHOLECYSTECTOMY    . EYE SURGERY    .  HEMORROIDECTOMY    . KNEE SURGERY Left   . LEFT HEART CATH AND CORONARY ANGIOGRAPHY N/A 05/23/2019   Procedure: LEFT HEART CATH AND CORONARY ANGIOGRAPHY;  Surgeon: Belva Crome, MD;  Location: [facility];  Service: Cardiovascular;  Laterality: N/A;  . LUMBAR LAMINECTOMY/DECOMPRESSION MICRODISCECTOMY N/A 08/06/2016   Procedure: LUMBAR THREE- LUMBAR FIVE  DECOMPRESSIVE LUMBAR LAMINECTOMY;  Surgeon: Jovita Gamma, MD;  Location: [facility];  Service: Neurosurgery;  Laterality: N/A;    SOCIAL HISTORY: Social History   Socioeconomic History  . Marital status: Divorced    Spouse name: Not on file  . Number of children: 2  . Years of education: Not on file  . Highest education level: Not on file  Occupational History  . Occupation: RETIRED  Tobacco Use  . Smoking status: Former Smoker    Packs/day: 4.00    Years: 20.00    Pack years: 80.00    Types: Cigarettes    Quit date: 10/14/1982    Years since quitting: 37.1  . Smokeless tobacco: Never Used  . Tobacco comment: 29 yrs ago  Substance and Sexual Activity  . Alcohol use: No  . Drug use: No  . Sexual activity: Not Currently    Birth control/protection: None  Other Topics Concern  . Not on file  Social History Narrative  .  Not on file   Social Determinants of Health   Financial Resource Strain: Low Risk   . Difficulty of Paying Living Expenses: Not hard at all  Food Insecurity: No Food Insecurity  . Worried About Charity fundraiser in the Last Year: Never true  . Ran Out of Food in the Last Year: Never true  Transportation Needs: No Transportation Needs  . Lack of Transportation (Medical): No  . Lack of Transportation (Non-Medical): No  Physical Activity: Inactive  . Days of Exercise per Week: 0 days  . Minutes of Exercise per Session: 0 min  Stress: No Stress Concern Present  . Feeling of Stress : Not at all  Social Connections: Moderately Isolated  . Frequency of Communication with Friends and Family: More than  three times a week  . Frequency of Social Gatherings with Friends and Family: More than three times a week  . Attends Religious Services: Never  . Active Member of Clubs or Organizations: No  . Attends Archivist Meetings: Never  . Marital Status: Divorced  Human resources officer Violence: Not At Risk  . Fear of Current or Ex-Partner: No  . Emotionally Abused: No  . Physically Abused: No  . Sexually Abused: No    FAMILY HISTORY: Family History  Problem Relation Age of Onset  . Diabetes Mother   . Cancer Mother        unknown kind  . Diabetes Sister   . COPD Sister   . Liver disease Brother   . COPD Brother   . Diabetes Maternal Grandmother   . Diabetes Brother   . COPD Sister   . Healthy Daughter   . Healthy Daughter     ALLERGIES:  is allergic to demerol; vasotec [enalapril]; and lasix [furosemide].  MEDICATIONS:  Current Outpatient Medications  Medication Sig Dispense Refill  . amiodarone (PACERONE) 200 MG tablet TAKE 1 TABLET BY MOUTH EVERY DAY 90 tablet 2  . apixaban (ELIQUIS) 5 MG TABS tablet Take 1 tablet (5 mg total) by mouth 2 (two) times daily. 60 tablet 6  . atorvastatin (LIPITOR) 20 MG tablet Take 1 tablet (20 mg total) by mouth daily. 90 tablet 3  . augmented betamethasone dipropionate (DIPROLENE-AF) 0.05 % cream Apply 1 application topically 2 (two) times daily. As needed.    . cimetidine (TAGAMET) 400 MG tablet Take 400 mg by mouth 3 (three) times daily.    . Cyanocobalamin (VITAMIN B 12 PO) Take 2,500 mcg by mouth daily.    [name] diltiazem (CARDIZEM CD) 360 MG 24 hr capsule TAKE 1 CAPSULE BY MOUTH EVERY DAY 90 capsule 1  . famotidine (PEPCID) 40 MG tablet Take 1 tablet (40 mg total) by mouth daily. 90 tablet 1  . glipiZIDE (GLUCOTROL) 5 MG tablet Take 2 tablets (10 mg total) by mouth 2 (two) times daily. 360 tablet 1  . metFORMIN (GLUCOPHAGE) 500 MG tablet Take 2 tablets (1,000 mg total) by mouth 2 (two) times daily. 360 tablet 1  . metoprolol tartrate  (LOPRESSOR) 25 MG tablet TAKE 1 TABLET BY MOUTH TWICE A DAY 180 tablet 1  . Omega-3 Fatty Acids (OMEGA-3 FISH OIL PO) Take 1 capsule by mouth daily.     [name] torsemide (DEMADEX) 20 MG tablet Take 1 tablet (20 mg total) by mouth 2 (two) times daily. 90 tablet 3   No current facility-administered medications for this visit.    REVIEW OF SYSTEMS:   Review of Systems  Constitutional: Positive for fatigue (severe). Negative for appetite  change, chills and fever.  HENT:   Negative for lump/mass, mouth sores, sore throat and trouble swallowing.   Eyes: Negative for eye problems.  Respiratory: Positive for shortness of breath (on exertion). Negative for chest tightness, cough and wheezing.   Cardiovascular: Negative for chest pain and palpitations.  Gastrointestinal: Positive for blood in stool (everyday). Negative for abdominal pain, constipation, diarrhea, nausea and vomiting.  Genitourinary: Negative for bladder incontinence, dysuria, frequency and hematuria.   Musculoskeletal: Negative for arthralgias, back pain, flank pain and myalgias.  Skin: Negative for rash.  Neurological: Negative for dizziness, headaches, light-headedness and numbness.  Hematological: Does not bruise/bleed easily.  Psychiatric/Behavioral: Negative for depression. The patient is not nervous/anxious.      PHYSICAL EXAMINATION: ECOG PERFORMANCE STATUS: 1 - Symptomatic but completely ambulatory  Vitals:   12/14/19 0803  BP: (!) 141/63  Pulse: 63  Resp: 16  Temp: (!) 96.9 F (36.1 C)  SpO2: 100%   Filed Weights   12/14/19 0803  Weight: 211 lb 3.2 oz (95.8 kg)   Physical Exam Constitutional:      Appearance: Normal appearance.  HENT:     Nose: No congestion.     Mouth/Throat:     Mouth: Mucous membranes are moist.  Eyes:     Extraocular Movements: Extraocular movements intact.     Pupils: Pupils are equal, round, and reactive to light.  Cardiovascular:     Rate and Rhythm: Normal rate and regular rhythm.      Heart sounds: No murmur. No gallop.   Pulmonary:     Breath sounds: No wheezing, rhonchi or rales.  Abdominal:     Tenderness: There is no abdominal tenderness.  Musculoskeletal:        General: No tenderness.     Cervical back: Normal range of motion. No tenderness.     Right lower leg: No edema.     Left lower leg: No edema.  Skin:    General: Skin is warm and dry.     Findings: No bruising, erythema or rash.  Neurological:     Mental Status: He is alert and oriented to person, place, and time.     Sensory: No sensory deficit.     Motor: No weakness.  Psychiatric:        Mood and Affect: Mood normal.        Behavior: Behavior normal.        Thought Content: Thought content normal.        Judgment: Judgment normal.      LABORATORY DATA:  I have reviewed the data as listed Recent Results (from the past 2160 hour(s))  CBC with Differential/Platelet     Status: Abnormal   Collection Time: 11/01/19  1:46 PM  Result Value Ref Range   WBC 5.3 4.0 - 10.5 K/uL   RBC 2.65 (L) 4.22 - 5.81 MIL/uL   Hemoglobin 9.7 (L) 13.0 - 17.0 g/dL   HCT 30.8 (L) 39.0 - 52.0 %   MCV 116.2 (H) 80.0 - 100.0 fL   MCH 36.6 (H) 26.0 - 34.0 pg   MCHC 31.5 30.0 - 36.0 g/dL   RDW 14.6 11.5 - 15.5 %   Platelets 129 (L) 150 - 400 K/uL    Comment: PLATELET COUNT CONFIRMED BY SMEAR SPECIMEN CHECKED FOR CLOTS    nRBC 0.0 0.0 - 0.2 %   Neutrophils Relative % 52 %   Neutro Abs 2.8 1.7 - 7.7 K/uL   Lymphocytes Relative 22 %   Lymphs  Abs 1.2 0.7 - 4.0 K/uL   Monocytes Relative 24 %   Monocytes Absolute 1.3 (H) 0.1 - 1.0 K/uL   Eosinophils Relative 0 %   Eosinophils Absolute 0.0 0.0 - 0.5 K/uL   Basophils Relative 0 %   Basophils Absolute 0.0 0.0 - 0.1 K/uL   Immature Granulocytes 2 %   Abs Immature Granulocytes 0.08 (H) 0.00 - 0.07 K/uL    Comment: Performed at [facility], [address], [place], [place] [address]  Comprehensive metabolic panel     Status: Abnormal   Collection Time: 11/01/19   1:46 PM  Result Value Ref Range   Sodium 138 135 - 145 mmol/L   Potassium 4.6 3.5 - 5.1 mmol/L   Chloride 103 98 - 111 mmol/L   CO2 23 22 - 32 mmol/L   Glucose, Bld 171 (H) 70 - 99 mg/dL    Comment: Glucose reference range applies only to samples taken after fasting for at least 8 hours.   BUN 24 (H) 8 - 23 mg/dL   Creatinine, Ser 1.79 (H) 0.61 - 1.24 mg/dL   Calcium 9.4 8.9 - 10.3 mg/dL   Total Protein 7.3 6.5 - 8.1 g/dL   Albumin 3.7 3.5 - 5.0 g/dL   AST 20 15 - 41 U/L   ALT 19 0 - 44 U/L   Alkaline Phosphatase 79 38 - 126 U/L   Total Bilirubin 0.9 0.3 - 1.2 mg/dL   GFR calc non Af Amer 37 (L) >60 mL/min   GFR calc Af Amer 43 (L) >60 mL/min   Anion gap 12 5 - 15    Comment: Performed at [facility], [address], [place], [place] [address]  Troponin I (High Sensitivity)     Status: None   Collection Time: 11/01/19  1:46 PM  Result Value Ref Range   Troponin I (High Sensitivity) 13 <18 ng/L    Comment: (NOTE) Elevated high sensitivity troponin I (hsTnI) values and significant  changes across serial measurements may suggest ACS but many other  chronic and acute conditions are known to elevate hsTnI results.  Refer to the "Links" section for chest pain algorithms and additional  guidance. Performed at [facility], [address], [place], [place] [address]   Brain natriuretic peptide     Status: Abnormal   Collection Time: 11/01/19  1:46 PM  Result Value Ref Range   B Natriuretic Peptide 308.0 (H) 0.0 - 100.0 pg/mL    Comment: Performed at [facility], [address], [place], [place] [address]  TSH     Status: None   Collection Time: 11/01/19  1:46 PM  Result Value Ref Range   TSH 1.502 0.350 - 4.500 uIU/mL    Comment: Performed by a 3rd Generation assay with a functional sensitivity of <=0.01 uIU/mL. Performed at [facility], [address], [place], [place] [address]   Troponin I (High Sensitivity)     Status: None   Collection Time: 11/01/19  3:39 PM  Result Value  Ref Range   Troponin I (High Sensitivity) 12 <18 ng/L    Comment: (NOTE) Elevated high sensitivity troponin I (hsTnI) values and significant  changes across serial measurements may suggest ACS but many other  chronic and acute conditions are known to elevate hsTnI results.  Refer to the "Links" section for chest pain algorithms and additional  guidance. Performed at [facility], [address], [place], [place] [address]   Hemoglobin A1c     Status: Abnormal  Collection Time: 11/27/19 10:43 AM  Result Value Ref Range   Hgb A1c MFr Bld 7.1 (H) 4.8 - 5.6 %    Comment:          Prediabetes: 5.7 - 6.4          Diabetes: >6.4          Glycemic control for adults with diabetes: <7.0    Est. average glucose Bld gHb Est-mCnc 157 mg/dL  Vitamin B12     Status: Abnormal   Collection Time: 11/27/19 10:43 AM  Result Value Ref Range   Vitamin B-12 >2000 (H) 232 - 1245 pg/mL  Folate     Status: None   Collection Time: 11/27/19 10:43 AM  Result Value Ref Range   Folate 13.1 >3.0 ng/mL    Comment: A serum folate concentration of less than 3.1 ng/mL is considered to represent clinical deficiency.   Basic metabolic panel     Status: Abnormal   Collection Time: 12/12/19 11:11 AM  Result Value Ref Range   Glucose 318 (H) 65 - 99 mg/dL   BUN 36 (H) 8 - 27 mg/dL   Creatinine, Ser 2.47 (H) 0.76 - 1.27 mg/dL   GFR calc non Af Amer 25 (L) >59 mL/min/1.73   GFR calc Af Amer 29 (L) >59 mL/min/1.73    Comment: **[facility] currently reports eGFR in compliance with the current**   recommendations of the [employer]. [facility] will   update reporting as new guidelines are published from the NKF-ASN   Task force.    BUN/Creatinine Ratio 15 10 - 24   Sodium 137 134 - 144 mmol/L   Potassium 4.1 3.5 - 5.2 mmol/L   Chloride 96 96 - 106 mmol/L   CO2 23 20 - 29 mmol/L   Calcium 8.9 8.6 - 10.2 mg/dL  PSA     Status: None   Collection Time: 12/12/19 11:11 AM  Result Value Ref Range   Prostate  Specific Ag, Serum 0.7 0.0 - 4.0 ng/mL    Comment: Roche ECLIA methodology. According to the American Urological Association, Serum PSA should decrease and remain at undetectable levels after radical prostatectomy. The AUA defines biochemical recurrence as an initial PSA value 0.2 ng/mL or greater followed by a subsequent confirmatory PSA value 0.2 ng/mL or greater. Values obtained with different assay methods or kits cannot be used interchangeably. Results cannot be interpreted as absolute evidence of the presence or absence of malignant disease.   B Nat Peptide     Status: Abnormal   Collection Time: 12/12/19 11:11 AM  Result Value Ref Range   BNP 111.8 (H) 0.0 - 100.0 pg/mL  CBC with Differential/Platelet     Status: Abnormal   Collection Time: 12/14/19  8:49 AM  Result Value Ref Range   WBC 5.3 4.0 - 10.5 K/uL   RBC 2.90 (L) 4.22 - 5.81 MIL/uL   Hemoglobin 10.8 (L) 13.0 - 17.0 g/dL   HCT 33.2 (L) 39.0 - 52.0 %   MCV 114.5 (H) 80.0 - 100.0 fL   MCH 37.2 (H) 26.0 - 34.0 pg   MCHC 32.5 30.0 - 36.0 g/dL   RDW 14.5 11.5 - 15.5 %   Platelets 137 (L) 150 - 400 K/uL   nRBC 0.0 0.0 - 0.2 %   Neutrophils Relative % 51 %   Neutro Abs 2.7 1.7 - 7.7 K/uL   Lymphocytes Relative 23 %   Lymphs Abs 1.2 0.7 - 4.0 K/uL   Monocytes Relative 22 %  Monocytes Absolute 1.2 (H) 0.1 - 1.0 K/uL   Eosinophils Relative 1 %   Eosinophils Absolute 0.0 0.0 - 0.5 K/uL   Basophils Relative 0 %   Basophils Absolute 0.0 0.0 - 0.1 K/uL   Immature Granulocytes 3 %   Abs Immature Granulocytes 0.15 (H) 0.00 - 0.07 K/uL    Comment: Performed at [facility], [address] [place], [place] [address]  Comprehensive metabolic panel     Status: Abnormal   Collection Time: 12/14/19  8:49 AM  Result Value Ref Range   Sodium 142 135 - 145 mmol/L   Potassium 4.6 3.5 - 5.1 mmol/L   Chloride 98 98 - 111 mmol/L   CO2 29 22 - 32 mmol/L   Glucose, Bld 214 (H) 70 - 99 mg/dL    Comment: Glucose reference range  applies only to samples taken after fasting for at least 8 hours.   BUN 41 (H) 8 - 23 mg/dL   Creatinine, Ser 2.39 (H) 0.61 - 1.24 mg/dL   Calcium 9.2 8.9 - 10.3 mg/dL   Total Protein 7.8 6.5 - 8.1 g/dL   Albumin 4.2 3.5 - 5.0 g/dL   AST 28 15 - 41 U/L   ALT 29 0 - 44 U/L   Alkaline Phosphatase 90 38 - 126 U/L   Total Bilirubin 1.1 0.3 - 1.2 mg/dL   GFR calc non Af Amer 26 (L) >60 mL/min   GFR calc Af Amer 30 (L) >60 mL/min   Anion gap 15 5 - 15    Comment: Performed at [facility], [address] [place], [place] [address]  Lactate dehydrogenase     Status: Abnormal   Collection Time: 12/14/19  8:49 AM  Result Value Ref Range   LDH 241 (H) 98 - 192 U/L    Comment: Performed at [facility], [address], [place], [place] [address]  Iron and TIBC     Status: None   Collection Time: 12/14/19  8:49 AM  Result Value Ref Range   Iron 82 45 - 182 ug/dL   TIBC 325 250 - 450 ug/dL   Saturation Ratios 25 17.9 - 39.5 %   UIBC 243 ug/dL    Comment: Performed at [facility], [address] [place], [place] [address]  Ferritin     Status: None   Collection Time: 12/14/19  8:49 AM  Result Value Ref Range   Ferritin 154 24 - 336 ng/mL    Comment: Performed at [facility], [address] [place], [place] [address]  Reticulocytes     Status: Abnormal   Collection Time: 12/14/19  8:49 AM  Result Value Ref Range   Retic Ct Pct 3.3 (H) 0.4 - 3.1 %   RBC. 2.90 (L) 4.22 - 5.81 MIL/uL   Retic Count, Absolute 94.3 19.0 - 186.0 K/uL   Immature Retic Fract 29.3 (H) 2.3 - 15.9 %    Comment: Performed at [facility], [address] [place], [place] [address]    RADIOGRAPHIC STUDIES: I have personally reviewed the radiological images as listed and agreed with the findings in the report. DG Chest 2 View  Result Date: 12/12/2019 CLINICAL DATA:  Shortness of breath for several days. EXAM: CHEST - 2 VIEW COMPARISON:  11/01/2019 FINDINGS: Borderline enlarged cardiac silhouette with an interval  decrease in size. Clear lungs with normal vascularity. Mild-to-moderate peribronchial thickening with mild improvement. Thoracic spine and bilateral shoulder degenerative changes. Cholecystectomy clips. IMPRESSION: Mild to moderate bronchitic changes with mild improvement.  Electronically Signed   By: Claudie Revering M.D.   On: 12/12/2019 11:42    ASSESSMENT & PLAN:  Macrocytic anemia 1.  Macrocytic anemia: -Patient seen at the request of Dr. Wolfgang Phoenix for microcytic anemia.  CBC on 11/01/2019 shows hemoglobin 9.7, MCV of 116.  Platelet count was mildly low at 129.  White count is normal. -TSH was normal at 1.5.  B12 was also normal along with folic acid. -Macrocytosis noted since February 2020.  Gradually worsening. -CTAP on 04/15/2017 shows no splenomegaly. -Colonoscopy on 06/05/2010 shows small polyp at sigmoid, right colon, hepatic flexure and transverse colon. -He reports that he sees blood on tissue paper once daily since Eliquis was started.  He also reports dyspnea on exertion for the last 1 year.  Denies any fevers, night sweats or weight loss. -Differential diagnosis includes combination anemia from myelodysplastic syndrome and CKD. -We will check his CBC, ferritin, iron panel, methylmalonic acid and copper levels.  We will also check reticulocyte count and LDH and haptoglobin.  We will obtain a serum erythropoietin level. -He will likely need a bone marrow aspiration and biopsy to confirm MDS.  After that he can be treated with erythropoiesis stimulating agents.  I will see him back again in 1 to 2 weeks to discuss the results and further plan.  2.  CKD: -History of CKD since 2017. -Renal ultrasound on 04/21/2019 shows bilateral renal cysts.  3.  Atrial fibrillation: -He is on Eliquis 5 mg twice daily.     All questions were answered. The patient knows to call the clinic with any problems, questions or concerns.   Derek Jack, MD 12/14/19 5:48 PM  [place] [phone]   I, Jacqualyn Posey, am acting as a scribe for Dr. Sanda Linger.  I, Derek Jack MD, have reviewed the above documentation for accuracy and completeness, and I agree with the above.

## 2019-12-14 ENCOUNTER — Inpatient Hospital Stay (HOSPITAL_COMMUNITY): Payer: Medicare HMO

## 2019-12-14 ENCOUNTER — Encounter (HOSPITAL_COMMUNITY): Payer: Self-pay | Admitting: Hematology

## 2019-12-14 ENCOUNTER — Inpatient Hospital Stay (HOSPITAL_COMMUNITY): Payer: Medicare HMO | Attending: Hematology | Admitting: Hematology

## 2019-12-14 DIAGNOSIS — I129 Hypertensive chronic kidney disease with stage 1 through stage 4 chronic kidney disease, or unspecified chronic kidney disease: Secondary | ICD-10-CM | POA: Diagnosis not present

## 2019-12-14 DIAGNOSIS — N281 Cyst of kidney, acquired: Secondary | ICD-10-CM | POA: Diagnosis not present

## 2019-12-14 DIAGNOSIS — I4891 Unspecified atrial fibrillation: Secondary | ICD-10-CM | POA: Diagnosis not present

## 2019-12-14 DIAGNOSIS — D469 Myelodysplastic syndrome, unspecified: Secondary | ICD-10-CM | POA: Insufficient documentation

## 2019-12-14 DIAGNOSIS — D539 Nutritional anemia, unspecified: Secondary | ICD-10-CM | POA: Diagnosis not present

## 2019-12-14 DIAGNOSIS — Z7984 Long term (current) use of oral hypoglycemic drugs: Secondary | ICD-10-CM | POA: Insufficient documentation

## 2019-12-14 DIAGNOSIS — Z8711 Personal history of peptic ulcer disease: Secondary | ICD-10-CM | POA: Diagnosis not present

## 2019-12-14 DIAGNOSIS — R948 Abnormal results of function studies of other organs and systems: Secondary | ICD-10-CM | POA: Insufficient documentation

## 2019-12-14 DIAGNOSIS — Z79899 Other long term (current) drug therapy: Secondary | ICD-10-CM | POA: Insufficient documentation

## 2019-12-14 DIAGNOSIS — Z87891 Personal history of nicotine dependence: Secondary | ICD-10-CM | POA: Diagnosis not present

## 2019-12-14 DIAGNOSIS — K760 Fatty (change of) liver, not elsewhere classified: Secondary | ICD-10-CM | POA: Insufficient documentation

## 2019-12-14 DIAGNOSIS — R69 Illness, unspecified: Secondary | ICD-10-CM | POA: Diagnosis not present

## 2019-12-14 DIAGNOSIS — F329 Major depressive disorder, single episode, unspecified: Secondary | ICD-10-CM | POA: Diagnosis not present

## 2019-12-14 DIAGNOSIS — E119 Type 2 diabetes mellitus without complications: Secondary | ICD-10-CM | POA: Diagnosis not present

## 2019-12-14 DIAGNOSIS — N189 Chronic kidney disease, unspecified: Secondary | ICD-10-CM | POA: Diagnosis not present

## 2019-12-14 DIAGNOSIS — Z87442 Personal history of urinary calculi: Secondary | ICD-10-CM | POA: Diagnosis not present

## 2019-12-14 DIAGNOSIS — I2510 Atherosclerotic heart disease of native coronary artery without angina pectoris: Secondary | ICD-10-CM | POA: Insufficient documentation

## 2019-12-14 DIAGNOSIS — K921 Melena: Secondary | ICD-10-CM | POA: Diagnosis not present

## 2019-12-14 LAB — IRON AND TIBC
Iron: 82 ug/dL (ref 45–182)
Saturation Ratios: 25 % (ref 17.9–39.5)
TIBC: 325 ug/dL (ref 250–450)
UIBC: 243 ug/dL

## 2019-12-14 LAB — CBC WITH DIFFERENTIAL/PLATELET
Abs Immature Granulocytes: 0.15 10*3/uL — ABNORMAL HIGH (ref 0.00–0.07)
Basophils Absolute: 0 10*3/uL (ref 0.0–0.1)
Basophils Relative: 0 %
Eosinophils Absolute: 0 10*3/uL (ref 0.0–0.5)
Eosinophils Relative: 1 %
HCT: 33.2 % — ABNORMAL LOW (ref 39.0–52.0)
Hemoglobin: 10.8 g/dL — ABNORMAL LOW (ref 13.0–17.0)
Immature Granulocytes: 3 %
Lymphocytes Relative: 23 %
Lymphs Abs: 1.2 10*3/uL (ref 0.7–4.0)
MCH: 37.2 pg — ABNORMAL HIGH (ref 26.0–34.0)
MCHC: 32.5 g/dL (ref 30.0–36.0)
MCV: 114.5 fL — ABNORMAL HIGH (ref 80.0–100.0)
Monocytes Absolute: 1.2 10*3/uL — ABNORMAL HIGH (ref 0.1–1.0)
Monocytes Relative: 22 %
Neutro Abs: 2.7 10*3/uL (ref 1.7–7.7)
Neutrophils Relative %: 51 %
Platelets: 137 10*3/uL — ABNORMAL LOW (ref 150–400)
RBC: 2.9 MIL/uL — ABNORMAL LOW (ref 4.22–5.81)
RDW: 14.5 % (ref 11.5–15.5)
WBC: 5.3 10*3/uL (ref 4.0–10.5)
nRBC: 0 % (ref 0.0–0.2)

## 2019-12-14 LAB — COMPREHENSIVE METABOLIC PANEL
ALT: 29 U/L (ref 0–44)
AST: 28 U/L (ref 15–41)
Albumin: 4.2 g/dL (ref 3.5–5.0)
Alkaline Phosphatase: 90 U/L (ref 38–126)
Anion gap: 15 (ref 5–15)
BUN: 41 mg/dL — ABNORMAL HIGH (ref 8–23)
CO2: 29 mmol/L (ref 22–32)
Calcium: 9.2 mg/dL (ref 8.9–10.3)
Chloride: 98 mmol/L (ref 98–111)
Creatinine, Ser: 2.39 mg/dL — ABNORMAL HIGH (ref 0.61–1.24)
GFR calc Af Amer: 30 mL/min — ABNORMAL LOW (ref >60)
GFR calc non Af Amer: 26 mL/min — ABNORMAL LOW (ref >60)
Glucose, Bld: 214 mg/dL — ABNORMAL HIGH (ref 70–99)
Potassium: 4.6 mmol/L (ref 3.5–5.1)
Sodium: 142 mmol/L (ref 135–145)
Total Bilirubin: 1.1 mg/dL (ref 0.3–1.2)
Total Protein: 7.8 g/dL (ref 6.5–8.1)

## 2019-12-14 LAB — RETICULOCYTES
Immature Retic Fract: 29.3 % — ABNORMAL HIGH (ref 2.3–15.9)
RBC.: 2.9 MIL/uL — ABNORMAL LOW (ref 4.22–5.81)
Retic Count, Absolute: 94.3 10*3/uL (ref 19.0–186.0)
Retic Ct Pct: 3.3 % — ABNORMAL HIGH (ref 0.4–3.1)

## 2019-12-14 LAB — FERRITIN: Ferritin: 154 ng/mL (ref 24–336)

## 2019-12-14 LAB — LACTATE DEHYDROGENASE: LDH: 241 U/L — ABNORMAL HIGH (ref 98–192)

## 2019-12-14 NOTE — Patient Instructions (Signed)
[place] at [facility] Discharge Instructions  You were seen today by Dr. Delton Coombes. He went over your recent results. The size of your red cells have increased in size, so we will complete some additional blood work today. He spoke with you about the potential for a bone marrow biopsy. He will see you back in 2 weeks for labs and follow up.   Thank you for choosing [place] at [facility] to provide your oncology and hematology care.  To afford each patient quality time with our provider, please arrive at least 15 minutes before your scheduled appointment time.   If you have a lab appointment with the [facility] please come in thru the  Main Entrance and check in at the main information desk  You need to re-schedule your appointment should you arrive 10 or more minutes late.  We strive to give you quality time with our providers, and arriving late affects you and other patients whose appointments are after yours.  Also, if you no show three or more times for appointments you may be dismissed from the clinic at the providers discretion.     Again, thank you for choosing [facility].  Our hope is that these requests will decrease the amount of time that you wait before being seen by our physicians.       _____________________________________________________________  Should you have questions after your visit to [facility], please contact our office at [phone] between the hours of 8:00 a.m. and 4:30 p.m.  Voicemails left after 4:00 p.m. will not be returned until the following business day.  For prescription refill requests, have your pharmacy contact our office and allow 72 hours.    Cancer Center Support Programs:   > Cancer Support Group  2nd Tuesday of the month 1pm-2pm, Journey Room

## 2019-12-14 NOTE — Assessment & Plan Note (Addendum)
1.  Macrocytic anemia: -Patient seen at the request of Dr. Wolfgang Phoenix for microcytic anemia.  CBC on 11/01/2019 shows hemoglobin 9.7, MCV of 116.  Platelet count was mildly low at 129.  White count is normal. -TSH was normal at 1.5.  B12 was also normal along with folic acid. -Macrocytosis noted since February 2020.  Gradually worsening. -CTAP on 04/15/2017 shows no splenomegaly. -Colonoscopy on 06/05/2010 shows small polyp at sigmoid, right colon, hepatic flexure and transverse colon. -He reports that he sees blood on tissue paper once daily since Eliquis was started.  He also reports dyspnea on exertion for the last 1 year.  Denies any fevers, night sweats or weight loss. -Differential diagnosis includes combination anemia from myelodysplastic syndrome and CKD. -We will check his CBC, ferritin, iron panel, methylmalonic acid and copper levels.  We will also check reticulocyte count and LDH and haptoglobin.  We will obtain a serum erythropoietin level. -He will likely need a bone marrow aspiration and biopsy to confirm MDS.  After that he can be treated with erythropoiesis stimulating agents.  I will see him back again in 1 to 2 weeks to discuss the results and further plan.  2.  CKD: -History of CKD since 2017. -Renal ultrasound on 04/21/2019 shows bilateral renal cysts.  3.  Atrial fibrillation: -He is on Eliquis 5 mg twice daily.

## 2019-12-15 LAB — PROTEIN ELECTROPHORESIS, SERUM
A/G Ratio: 1.1 (ref 0.7–1.7)
Albumin ELP: 3.6 g/dL (ref 2.9–4.4)
Alpha-1-Globulin: 0.2 g/dL (ref 0.0–0.4)
Alpha-2-Globulin: 1.1 g/dL — ABNORMAL HIGH (ref 0.4–1.0)
Beta Globulin: 1 g/dL (ref 0.7–1.3)
Gamma Globulin: 1 g/dL (ref 0.4–1.8)
Globulin, Total: 3.3 g/dL (ref 2.2–3.9)
Total Protein ELP: 6.9 g/dL (ref 6.0–8.5)

## 2019-12-15 LAB — ERYTHROPOIETIN: Erythropoietin: 77.2 m[IU]/mL — ABNORMAL HIGH (ref 2.6–18.5)

## 2019-12-15 LAB — HAPTOGLOBIN: Haptoglobin: 207 mg/dL (ref 34–355)

## 2019-12-19 LAB — COPPER, SERUM: Copper: 127 ug/dL (ref 69–132)

## 2019-12-21 LAB — METHYLMALONIC ACID, SERUM: Methylmalonic Acid, Quantitative: 314 nmol/L (ref 0–378)

## 2019-12-29 ENCOUNTER — Inpatient Hospital Stay (HOSPITAL_COMMUNITY): Payer: Medicare HMO | Attending: Hematology | Admitting: Hematology

## 2019-12-29 VITALS — BP 154/71 | HR 79 | Temp 97.1°F | Resp 18 | Wt 213.6 lb

## 2019-12-29 DIAGNOSIS — N189 Chronic kidney disease, unspecified: Secondary | ICD-10-CM | POA: Diagnosis not present

## 2019-12-29 DIAGNOSIS — Z87442 Personal history of urinary calculi: Secondary | ICD-10-CM | POA: Diagnosis not present

## 2019-12-29 DIAGNOSIS — E119 Type 2 diabetes mellitus without complications: Secondary | ICD-10-CM | POA: Diagnosis not present

## 2019-12-29 DIAGNOSIS — E1122 Type 2 diabetes mellitus with diabetic chronic kidney disease: Secondary | ICD-10-CM | POA: Insufficient documentation

## 2019-12-29 DIAGNOSIS — R0602 Shortness of breath: Secondary | ICD-10-CM | POA: Insufficient documentation

## 2019-12-29 DIAGNOSIS — I2510 Atherosclerotic heart disease of native coronary artery without angina pectoris: Secondary | ICD-10-CM | POA: Insufficient documentation

## 2019-12-29 DIAGNOSIS — E785 Hyperlipidemia, unspecified: Secondary | ICD-10-CM | POA: Diagnosis not present

## 2019-12-29 DIAGNOSIS — I129 Hypertensive chronic kidney disease with stage 1 through stage 4 chronic kidney disease, or unspecified chronic kidney disease: Secondary | ICD-10-CM | POA: Diagnosis not present

## 2019-12-29 DIAGNOSIS — D539 Nutritional anemia, unspecified: Secondary | ICD-10-CM

## 2019-12-29 DIAGNOSIS — I4891 Unspecified atrial fibrillation: Secondary | ICD-10-CM | POA: Diagnosis not present

## 2019-12-29 DIAGNOSIS — I10 Essential (primary) hypertension: Secondary | ICD-10-CM | POA: Insufficient documentation

## 2019-12-29 DIAGNOSIS — Z7984 Long term (current) use of oral hypoglycemic drugs: Secondary | ICD-10-CM | POA: Diagnosis not present

## 2019-12-29 DIAGNOSIS — D696 Thrombocytopenia, unspecified: Secondary | ICD-10-CM | POA: Insufficient documentation

## 2019-12-29 DIAGNOSIS — M129 Arthropathy, unspecified: Secondary | ICD-10-CM | POA: Diagnosis not present

## 2019-12-29 DIAGNOSIS — F329 Major depressive disorder, single episode, unspecified: Secondary | ICD-10-CM | POA: Insufficient documentation

## 2019-12-29 DIAGNOSIS — R69 Illness, unspecified: Secondary | ICD-10-CM | POA: Diagnosis not present

## 2019-12-29 DIAGNOSIS — Z87891 Personal history of nicotine dependence: Secondary | ICD-10-CM | POA: Insufficient documentation

## 2019-12-29 DIAGNOSIS — Z79899 Other long term (current) drug therapy: Secondary | ICD-10-CM | POA: Insufficient documentation

## 2019-12-29 NOTE — Progress Notes (Signed)
Jack Mejia, [place] [address]   CLINIC:  Medical Oncology/Hematology  PCP:  Jack Mejia, [place] / [place] [address]  [phone]  REASON FOR VISIT:  Follow-up for macrocytic anemia  PRIOR THERAPY: None  CURRENT THERAPY: Feraheme weekly x2  INTERVAL HISTORY:  Jack Mejia, a 74 y.o. male, returns for routine follow-up for his macrocytic anemia. [name] was last seen on 12/14/2019.  Today he reports continued bleeding, sees blood on the tissue paper.[name] He reports having no energy.  Reports some shortness of breath on exertion.   REVIEW OF SYSTEMS:  Review of Systems  Constitutional: Positive for fatigue (severe). Negative for appetite change.  Respiratory: Positive for shortness of breath.   Hematological: Bruises/bleeds easily.  Psychiatric/Behavioral: Positive for sleep disturbance.  All other systems reviewed and are negative.   PAST MEDICAL/SURGICAL HISTORY:  Past Medical History:  Diagnosis Date  . Arthritis   . Atrial fibrillation ([place])    a. s/p DCCV in 03/2019  . CAD (coronary artery disease) 06/29/2019  . Cramps of left lower extremity   . Depression   . Diabetes mellitus    type 2 for 7-8 yrs  . Dyspnea   . Elevated liver enzymes   . Fatty liver   . History of kidney stones   . Hyperlipidemia   . Hypertension   . Vertigo    Past Surgical History:  Procedure Laterality Date  . CARDIOVERSION N/A 04/21/2019   Procedure: CARDIOVERSION;  Surgeon: Sanda Klein, MD;  Location: [facility];  Service: Cardiovascular;  Laterality: N/A;  . CARPAL TUNNEL RELEASE     both wrist  . cataract surgery     bilateral  . CHOLECYSTECTOMY    . EYE SURGERY    . HEMORROIDECTOMY    . KNEE SURGERY Left   . LEFT HEART CATH AND CORONARY ANGIOGRAPHY N/A 05/23/2019   Procedure: LEFT HEART CATH AND CORONARY ANGIOGRAPHY;  Surgeon: Belva Crome, MD;  Location: [facility];  Service:  Cardiovascular;  Laterality: N/A;  . LUMBAR LAMINECTOMY/DECOMPRESSION MICRODISCECTOMY N/A 08/06/2016   Procedure: LUMBAR THREE- LUMBAR FIVE  DECOMPRESSIVE LUMBAR LAMINECTOMY;  Surgeon: Jovita Gamma, MD;  Location: [facility];  Service: Neurosurgery;  Laterality: N/A;    SOCIAL HISTORY:  Social History   Socioeconomic History  . Marital status: Divorced    Spouse name: Not on file  . Number of children: 2  . Years of education: Not on file  . Highest education level: Not on file  Occupational History  . Occupation: RETIRED  Tobacco Use  . Smoking status: Former Smoker    Packs/day: 4.00    Years: 20.00    Pack years: 80.00    Types: Cigarettes    Quit date: 10/14/1982    Years since quitting: 37.2  . Smokeless tobacco: Never Used  . Tobacco comment: 29 yrs ago  Substance and Sexual Activity  . Alcohol use: No  . Drug use: No  . Sexual activity: Not Currently    Birth control/protection: None  Other Topics Concern  . Not on file  Social History Narrative  . Not on file   Social Determinants of Health   Financial Resource Strain: Low Risk   . Difficulty of Paying Living Expenses: Not hard at all  Food Insecurity: No Food Insecurity  . Worried About Charity fundraiser in the Last Year: Never true  . Ran Out of Food in the Last  Year: Never true  Transportation Needs: No Transportation Needs  . Lack of Transportation (Medical): No  . Lack of Transportation (Non-Medical): No  Physical Activity: Inactive  . Days of Exercise per Week: 0 days  . Minutes of Exercise per Session: 0 min  Stress: No Stress Concern Present  . Feeling of Stress : Not at all  Social Connections: Moderately Isolated  . Frequency of Communication with Friends and Family: More than three times a week  . Frequency of Social Gatherings with Friends and Family: More than three times a week  . Attends Religious Services: Never  . Active Member of Clubs or Organizations: No  . Attends Archivist  Meetings: Never  . Marital Status: Divorced  Human resources officer Violence: Not At Risk  . Fear of Current or Ex-Partner: No  . Emotionally Abused: No  . Physically Abused: No  . Sexually Abused: No    FAMILY HISTORY:  Family History  Problem Relation Age of Onset  . Diabetes Mother   . Cancer Mother        unknown kind  . Diabetes Sister   . COPD Sister   . Liver disease Brother   . COPD Brother   . Diabetes Maternal Grandmother   . Diabetes Brother   . COPD Sister   . Healthy Daughter   . Healthy Daughter     CURRENT MEDICATIONS:  Current Outpatient Medications  Medication Sig Dispense Refill  . amiodarone (PACERONE) 200 MG tablet TAKE 1 TABLET BY MOUTH EVERY DAY 90 tablet 2  . apixaban (ELIQUIS) 5 MG TABS tablet Take 1 tablet (5 mg total) by mouth 2 (two) times daily. 60 tablet 6  . atorvastatin (LIPITOR) 20 MG tablet Take 1 tablet (20 mg total) by mouth daily. 90 tablet 3  . augmented betamethasone dipropionate (DIPROLENE-AF) 0.05 % cream Apply 1 application topically 2 (two) times daily. As needed.    . cimetidine (TAGAMET) 400 MG tablet Take 400 mg by mouth 3 (three) times daily.    . Cyanocobalamin (VITAMIN B 12 PO) Take 2,500 mcg by mouth daily.    [name] diltiazem (CARDIZEM CD) 360 MG 24 hr capsule TAKE 1 CAPSULE BY MOUTH EVERY DAY 90 capsule 1  . famotidine (PEPCID) 40 MG tablet Take 1 tablet (40 mg total) by mouth daily. 90 tablet 1  . glipiZIDE (GLUCOTROL) 5 MG tablet Take 2 tablets (10 mg total) by mouth 2 (two) times daily. 360 tablet 1  . metFORMIN (GLUCOPHAGE) 500 MG tablet Take 2 tablets (1,000 mg total) by mouth 2 (two) times daily. 360 tablet 1  . metoprolol tartrate (LOPRESSOR) 25 MG tablet TAKE 1 TABLET BY MOUTH TWICE A DAY 180 tablet 1  . Omega-3 Fatty Acids (OMEGA-3 FISH OIL PO) Take 1 capsule by mouth daily.     [name] torsemide (DEMADEX) 20 MG tablet Take 1 tablet (20 mg total) by mouth 2 (two) times daily. 90 tablet 3   No current facility-administered  medications for this visit.    ALLERGIES:  Allergies  Allergen Reactions  . Demerol Nausea And Vomiting  . Vasotec [Enalapril] Cough  . Lasix [Furosemide] Rash    PHYSICAL EXAM:  Performance status (ECOG): 1 - Symptomatic but completely ambulatory  There were no vitals filed for this visit. Wt Readings from Last 3 Encounters:  12/14/19 211 lb 3.2 oz (95.8 kg)  12/12/19 211 lb 6.4 oz (95.9 kg)  12/01/19 207 lb (93.9 kg)   Physical Exam Vitals reviewed.  Constitutional:  Appearance: Normal appearance.  Cardiovascular:     Rate and Rhythm: Normal rate and regular rhythm.     Pulses: Normal pulses.     Heart sounds: Normal heart sounds.  Pulmonary:     Effort: Pulmonary effort is normal.     Breath sounds: Normal breath sounds.  Skin:    Coloration: Skin is pale.  Neurological:     General: No focal deficit present.     Mental Status: He is alert and oriented to person, place, and time.  Psychiatric:        Mood and Affect: Mood normal.        Behavior: Behavior normal.     LABORATORY DATA:  I have reviewed the labs as listed.  CBC Latest Ref Rng & Units 12/14/2019 11/01/2019 05/23/2019  WBC 4.0 - 10.5 K/uL 5.3 5.3 -  Hemoglobin 13.0 - 17.0 g/dL 10.8(L) 9.7(L) 9.9(L)  Hematocrit 39.0 - 52.0 % 33.2(L) 30.8(L) 29.0(L)  Platelets 150 - 400 K/uL 137(L) 129(L) -   CMP Latest Ref Rng & Units 12/14/2019 12/12/2019 11/01/2019  Glucose 70 - 99 mg/dL 214(H) 318(H) 171(H)  BUN 8 - 23 mg/dL 41(H) 36(H) 24(H)  Creatinine 0.61 - 1.24 mg/dL 2.39(H) 2.47(H) 1.79(H)  Sodium 135 - 145 mmol/L 142 137 138  Potassium 3.5 - 5.1 mmol/L 4.6 4.1 4.6  Chloride 98 - 111 mmol/L 98 96 103  CO2 22 - 32 mmol/L _0 Calcium 8.9 - 10.3 mg/dL 9.2 8.9 9.4  Total Protein 6.5 - 8.1 g/dL 7.8 - 7.3  Total Bilirubin 0.3 - 1.2 mg/dL 1.1 - 0.9  Alkaline Phos 38 - 126 U/L 90 - 79  AST 15 - 41 U/L 28 - 20  ALT 0 - 44 U/L 29 - 19      Component Value Date/Time   RBC 2.90 (L) 12/14/2019 0849    RBC 2.90 (L) 12/14/2019 0849   MCV 114.5 (H) 12/14/2019 0849   MCV 101 (H) 09/07/2018 1638   MCH 37.2 (H) 12/14/2019 0849   MCHC 32.5 12/14/2019 0849   RDW 14.5 12/14/2019 0849   RDW 12.8 09/07/2018 1638   LYMPHSABS 1.2 12/14/2019 0849   LYMPHSABS 1.3 09/07/2018 1638   MONOABS 1.2 (H) 12/14/2019 0849   EOSABS 0.0 12/14/2019 0849   EOSABS 0.2 09/07/2018 1638   BASOSABS 0.0 12/14/2019 0849   BASOSABS 0.1 09/07/2018 1638   Lab Results  Component Value Date   TIBC 325 12/14/2019   FERRITIN 154 12/14/2019   FERRITIN 309 05/30/2019   IRONPCTSAT 25 12/14/2019    DIAGNOSTIC IMAGING:  I have independently reviewed the scans and discussed with the patient. DG Chest 2 View  Result Date: 12/12/2019 CLINICAL DATA:  Shortness of breath for several days. EXAM: CHEST - 2 VIEW COMPARISON:  11/01/2019 FINDINGS: Borderline enlarged cardiac silhouette with an interval decrease in size. Clear lungs with normal vascularity. Mild-to-moderate peribronchial thickening with mild improvement. Thoracic spine and bilateral shoulder degenerative changes. Cholecystectomy clips. IMPRESSION: Mild to moderate bronchitic changes with mild improvement. Electronically Signed   By: Claudie Revering M.D.   On: 12/12/2019 11:42     ASSESSMENT:  1.  Macrocytic anemia: -Patient seen at the request of Dr. Wolfgang Phoenix for macrocytic anemia. -CTAP on 04/15/2017 showed no splenomegaly. -Patient has macrocytosis noted since February 2020, gradually worsening. -Colonoscopy on 06/05/2010 shows small polyp in the sigmoid colon colon, right colon, hepatic flexure and transverse colon. -He has blood on tissue paper once daily since Eliquis was started. -Labs  from 12/14/2019 shows hemoglobin 10.8 with normal white count.  Mild thrombocytopenia of 137.  This was 129 previously. -Serum erythropoietin was 77.2. -SPEP was negative.  Other nutritional deficiencies were ruled out. -Differential diagnosis includes anemia from combination of CKD  and early MDS. -Patient does not want to have a bone marrow biopsy done to confirm this.  2.  CKD: -History of CKD since 2017. -Renal ultrasound on 04/21/2019 shows bilateral renal cysts.   PLAN:  1.  Macrocytic anemia: -We have reviewed labs.  Ferritin was 154 with percent saturation of 25. -He does not want to have a bone marrow biopsy done. -I have recommended weekly Feraheme x2.  We discussed the side effects including rare chance of anaphylactic reactions. -We will see him back in 2 months for follow-up with repeat labs.  2.  CKD: -CKD stable with creatinine around 2.39.  3.  Atrial fibrillation: -Continue Eliquis 5 mg twice daily.   Orders placed this encounter:  No orders of the defined types were placed in this encounter.    Mejia Jack, MD [place] [phone]   I, Milinda Antis, am acting as a scribe for Dr. Sanda Linger.  I, Mejia Jack MD, have reviewed the above documentation for accuracy and completeness, and I agree with the above.

## 2019-12-29 NOTE — Patient Instructions (Signed)
[place] at [facility] Discharge Instructions  You were seen today by Dr. Delton Coombes. He went over your recent results. Your red blood cell count is low, and your red blood cells are enlarged. You will be scheduled to receive iron infusions to help with your low blood count. Dr. Delton Coombes will see you back in 2 months for labs and follow up.   Thank you for choosing [place] at [facility] to provide your oncology and hematology care.  To afford each patient quality time with our provider, please arrive at least 15 minutes before your scheduled appointment time.   If you have a lab appointment with the [facility] please come in thru the  Main Entrance and check in at the main information desk  You need to re-schedule your appointment should you arrive 10 or more minutes late.  We strive to give you quality time with our providers, and arriving late affects you and other patients whose appointments are after yours.  Also, if you no show three or more times for appointments you may be dismissed from the clinic at the providers discretion.     Again, thank you for choosing [facility].  Our hope is that these requests will decrease the amount of time that you wait before being seen by our physicians.       _____________________________________________________________  Should you have questions after your visit to [facility], please contact our office at [phone] between the hours of 8:00 a.m. and 4:30 p.m.  Voicemails left after 4:00 p.m. will not be returned until the following business day.  For prescription refill requests, have your pharmacy contact our office and allow 72 hours.    Cancer Center Support Programs:   > Cancer Support Group  2nd Tuesday of the month 1pm-2pm, Journey Room

## 2020-01-01 DIAGNOSIS — D638 Anemia in other chronic diseases classified elsewhere: Secondary | ICD-10-CM

## 2020-01-01 DIAGNOSIS — D509 Iron deficiency anemia, unspecified: Secondary | ICD-10-CM | POA: Insufficient documentation

## 2020-01-01 HISTORY — DX: Anemia in other chronic diseases classified elsewhere: D63.8

## 2020-01-15 ENCOUNTER — Encounter (HOSPITAL_COMMUNITY): Payer: Self-pay

## 2020-01-15 ENCOUNTER — Inpatient Hospital Stay (HOSPITAL_COMMUNITY): Payer: Medicare HMO

## 2020-01-15 VITALS — BP 146/68 | HR 60 | Temp 98.0°F | Resp 18

## 2020-01-15 DIAGNOSIS — D696 Thrombocytopenia, unspecified: Secondary | ICD-10-CM | POA: Diagnosis not present

## 2020-01-15 DIAGNOSIS — I4891 Unspecified atrial fibrillation: Secondary | ICD-10-CM | POA: Diagnosis not present

## 2020-01-15 DIAGNOSIS — D508 Other iron deficiency anemias: Secondary | ICD-10-CM

## 2020-01-15 DIAGNOSIS — N183 Chronic kidney disease, stage 3 unspecified: Secondary | ICD-10-CM

## 2020-01-15 DIAGNOSIS — I129 Hypertensive chronic kidney disease with stage 1 through stage 4 chronic kidney disease, or unspecified chronic kidney disease: Secondary | ICD-10-CM | POA: Diagnosis not present

## 2020-01-15 DIAGNOSIS — R0602 Shortness of breath: Secondary | ICD-10-CM | POA: Diagnosis not present

## 2020-01-15 DIAGNOSIS — D539 Nutritional anemia, unspecified: Secondary | ICD-10-CM

## 2020-01-15 DIAGNOSIS — M129 Arthropathy, unspecified: Secondary | ICD-10-CM | POA: Diagnosis not present

## 2020-01-15 DIAGNOSIS — I2510 Atherosclerotic heart disease of native coronary artery without angina pectoris: Secondary | ICD-10-CM | POA: Diagnosis not present

## 2020-01-15 DIAGNOSIS — E1122 Type 2 diabetes mellitus with diabetic chronic kidney disease: Secondary | ICD-10-CM | POA: Diagnosis not present

## 2020-01-15 DIAGNOSIS — N189 Chronic kidney disease, unspecified: Secondary | ICD-10-CM | POA: Diagnosis not present

## 2020-01-15 DIAGNOSIS — R69 Illness, unspecified: Secondary | ICD-10-CM | POA: Diagnosis not present

## 2020-01-15 DIAGNOSIS — N179 Acute kidney failure, unspecified: Secondary | ICD-10-CM

## 2020-01-15 MED ORDER — SODIUM CHLORIDE 0.9 % IV SOLN
200.0000 mg | Freq: Once | INTRAVENOUS | Status: AC
  Administered 2020-01-15: 200 mg via INTRAVENOUS
  Filled 2020-01-15: qty 200

## 2020-01-15 MED ORDER — SODIUM CHLORIDE 0.9 % IV SOLN: Freq: Once | INTRAVENOUS | Status: AC

## 2020-01-15 NOTE — Progress Notes (Signed)
Tolerated infusion w/o adverse reaction.  Alert, in no distress.  VSS.  Discharged ambulatory.  

## 2020-01-17 ENCOUNTER — Inpatient Hospital Stay (HOSPITAL_COMMUNITY): Payer: Medicare HMO

## 2020-01-17 ENCOUNTER — Encounter (HOSPITAL_COMMUNITY): Payer: Self-pay

## 2020-01-17 ENCOUNTER — Other Ambulatory Visit: Payer: Self-pay

## 2020-01-17 VITALS — BP 155/69 | HR 62 | Temp 97.3°F | Resp 18

## 2020-01-17 DIAGNOSIS — R0602 Shortness of breath: Secondary | ICD-10-CM | POA: Diagnosis not present

## 2020-01-17 DIAGNOSIS — M129 Arthropathy, unspecified: Secondary | ICD-10-CM | POA: Diagnosis not present

## 2020-01-17 DIAGNOSIS — I2510 Atherosclerotic heart disease of native coronary artery without angina pectoris: Secondary | ICD-10-CM | POA: Diagnosis not present

## 2020-01-17 DIAGNOSIS — N179 Acute kidney failure, unspecified: Secondary | ICD-10-CM

## 2020-01-17 DIAGNOSIS — I4891 Unspecified atrial fibrillation: Secondary | ICD-10-CM | POA: Diagnosis not present

## 2020-01-17 DIAGNOSIS — N189 Chronic kidney disease, unspecified: Secondary | ICD-10-CM | POA: Diagnosis not present

## 2020-01-17 DIAGNOSIS — D539 Nutritional anemia, unspecified: Secondary | ICD-10-CM

## 2020-01-17 DIAGNOSIS — R69 Illness, unspecified: Secondary | ICD-10-CM | POA: Diagnosis not present

## 2020-01-17 DIAGNOSIS — I129 Hypertensive chronic kidney disease with stage 1 through stage 4 chronic kidney disease, or unspecified chronic kidney disease: Secondary | ICD-10-CM | POA: Diagnosis not present

## 2020-01-17 DIAGNOSIS — N183 Chronic kidney disease, stage 3 unspecified: Secondary | ICD-10-CM

## 2020-01-17 DIAGNOSIS — D508 Other iron deficiency anemias: Secondary | ICD-10-CM

## 2020-01-17 DIAGNOSIS — E1122 Type 2 diabetes mellitus with diabetic chronic kidney disease: Secondary | ICD-10-CM | POA: Diagnosis not present

## 2020-01-17 DIAGNOSIS — D696 Thrombocytopenia, unspecified: Secondary | ICD-10-CM | POA: Diagnosis not present

## 2020-01-17 MED ORDER — SODIUM CHLORIDE 0.9 % IV SOLN
200.0000 mg | Freq: Once | INTRAVENOUS | Status: AC
  Administered 2020-01-17: 200 mg via INTRAVENOUS
  Filled 2020-01-17: qty 200

## 2020-01-17 MED ORDER — SODIUM CHLORIDE 0.9 % IV SOLN: Freq: Once | INTRAVENOUS | Status: AC

## 2020-01-17 NOTE — Patient Instructions (Signed)
[facility] at [facility]  Discharge Instructions:   _______________________________________________________________  Thank you for choosing [facility] at [facility] to provide your oncology and hematology care.  To afford each patient quality time with our providers, please arrive at least 15 minutes before your scheduled appointment.  You need to re-schedule your appointment if you arrive 10 or more minutes late.  We strive to give you quality time with our providers, and arriving late affects you and other patients whose appointments are after yours.  Also, if you no show three or more times for appointments you may be dismissed from the clinic.  Again, thank you for choosing [facility]. Our hope is that these requests will allow you access to exceptional care and in a timely manner. _______________________________________________________________  If you have questions after your visit, please contact our office at [phone] between the hours of 8:30 a.m. and 5:00 p.m. Voicemails left after 4:30 p.m. will not be returned until the following business day. _______________________________________________________________  For prescription refill requests, have your pharmacy contact our office. _______________________________________________________________  Recommendations made by the consultant and any test results will be sent to your referring physician. _______________________________________________________________ 

## 2020-01-17 NOTE — Progress Notes (Signed)
Venofer given today per MD orders. Tolerated infusion without adverse affects. Vital signs stable. No complaints at this time. Discharged from clinic ambulatory. F/U with [facility] as scheduled.

## 2020-01-19 ENCOUNTER — Other Ambulatory Visit: Payer: Self-pay

## 2020-01-19 ENCOUNTER — Inpatient Hospital Stay (HOSPITAL_COMMUNITY): Payer: Medicare HMO

## 2020-01-19 ENCOUNTER — Encounter (HOSPITAL_COMMUNITY): Payer: Self-pay

## 2020-01-19 VITALS — BP 146/72 | HR 57 | Temp 97.3°F | Resp 18

## 2020-01-19 DIAGNOSIS — R0602 Shortness of breath: Secondary | ICD-10-CM | POA: Diagnosis not present

## 2020-01-19 DIAGNOSIS — N179 Acute kidney failure, unspecified: Secondary | ICD-10-CM

## 2020-01-19 DIAGNOSIS — D539 Nutritional anemia, unspecified: Secondary | ICD-10-CM

## 2020-01-19 DIAGNOSIS — N183 Chronic kidney disease, stage 3 unspecified: Secondary | ICD-10-CM

## 2020-01-19 DIAGNOSIS — D696 Thrombocytopenia, unspecified: Secondary | ICD-10-CM | POA: Diagnosis not present

## 2020-01-19 DIAGNOSIS — R69 Illness, unspecified: Secondary | ICD-10-CM | POA: Diagnosis not present

## 2020-01-19 DIAGNOSIS — D508 Other iron deficiency anemias: Secondary | ICD-10-CM

## 2020-01-19 DIAGNOSIS — N189 Chronic kidney disease, unspecified: Secondary | ICD-10-CM | POA: Diagnosis not present

## 2020-01-19 DIAGNOSIS — I2510 Atherosclerotic heart disease of native coronary artery without angina pectoris: Secondary | ICD-10-CM | POA: Diagnosis not present

## 2020-01-19 DIAGNOSIS — E1122 Type 2 diabetes mellitus with diabetic chronic kidney disease: Secondary | ICD-10-CM | POA: Diagnosis not present

## 2020-01-19 DIAGNOSIS — I4891 Unspecified atrial fibrillation: Secondary | ICD-10-CM | POA: Diagnosis not present

## 2020-01-19 DIAGNOSIS — I129 Hypertensive chronic kidney disease with stage 1 through stage 4 chronic kidney disease, or unspecified chronic kidney disease: Secondary | ICD-10-CM | POA: Diagnosis not present

## 2020-01-19 DIAGNOSIS — M129 Arthropathy, unspecified: Secondary | ICD-10-CM | POA: Diagnosis not present

## 2020-01-19 MED ORDER — SODIUM CHLORIDE 0.9 % IV SOLN
200.0000 mg | Freq: Once | INTRAVENOUS | Status: AC
  Administered 2020-01-19: 200 mg via INTRAVENOUS
  Filled 2020-01-19: qty 200

## 2020-01-19 MED ORDER — SODIUM CHLORIDE 0.9 % IV SOLN: Freq: Once | INTRAVENOUS | Status: AC

## 2020-01-19 NOTE — Progress Notes (Signed)
Patient tolerated iron infusion with no complaints voiced.  Peripheral IV site clean and dry with good blood return noted before and after infusion.  Band aid applied.  VSS with discharge and left ambulatory with no s/s of distress noted.  

## 2020-01-22 ENCOUNTER — Other Ambulatory Visit: Payer: Self-pay

## 2020-01-22 ENCOUNTER — Inpatient Hospital Stay (HOSPITAL_COMMUNITY): Payer: Medicare HMO

## 2020-01-22 VITALS — BP 138/53 | HR 58 | Temp 96.7°F | Resp 20

## 2020-01-22 DIAGNOSIS — N179 Acute kidney failure, unspecified: Secondary | ICD-10-CM

## 2020-01-22 DIAGNOSIS — I2510 Atherosclerotic heart disease of native coronary artery without angina pectoris: Secondary | ICD-10-CM | POA: Diagnosis not present

## 2020-01-22 DIAGNOSIS — D508 Other iron deficiency anemias: Secondary | ICD-10-CM

## 2020-01-22 DIAGNOSIS — I129 Hypertensive chronic kidney disease with stage 1 through stage 4 chronic kidney disease, or unspecified chronic kidney disease: Secondary | ICD-10-CM | POA: Diagnosis not present

## 2020-01-22 DIAGNOSIS — N189 Chronic kidney disease, unspecified: Secondary | ICD-10-CM | POA: Diagnosis not present

## 2020-01-22 DIAGNOSIS — R69 Illness, unspecified: Secondary | ICD-10-CM | POA: Diagnosis not present

## 2020-01-22 DIAGNOSIS — M129 Arthropathy, unspecified: Secondary | ICD-10-CM | POA: Diagnosis not present

## 2020-01-22 DIAGNOSIS — E1122 Type 2 diabetes mellitus with diabetic chronic kidney disease: Secondary | ICD-10-CM | POA: Diagnosis not present

## 2020-01-22 DIAGNOSIS — D539 Nutritional anemia, unspecified: Secondary | ICD-10-CM

## 2020-01-22 DIAGNOSIS — R0602 Shortness of breath: Secondary | ICD-10-CM | POA: Diagnosis not present

## 2020-01-22 DIAGNOSIS — D696 Thrombocytopenia, unspecified: Secondary | ICD-10-CM | POA: Diagnosis not present

## 2020-01-22 DIAGNOSIS — N183 Chronic kidney disease, stage 3 unspecified: Secondary | ICD-10-CM

## 2020-01-22 DIAGNOSIS — I4891 Unspecified atrial fibrillation: Secondary | ICD-10-CM | POA: Diagnosis not present

## 2020-01-22 MED ORDER — SODIUM CHLORIDE 0.9 % IV SOLN
200.0000 mg | Freq: Once | INTRAVENOUS | Status: AC
  Administered 2020-01-22: 200 mg via INTRAVENOUS
  Filled 2020-01-22: qty 200

## 2020-01-22 MED ORDER — SODIUM CHLORIDE 0.9 % IV SOLN: Freq: Once | INTRAVENOUS | Status: AC

## 2020-01-22 NOTE — Patient Instructions (Signed)
[place] at [facility]  Discharge Instructions:  IV iron received today. Follow up as scheduled.  Iron Sucrose injection What is this medicine? IRON SUCROSE (AHY ern SOO krohs) is an iron complex. Iron is used to make healthy red blood cells, which carry oxygen and nutrients throughout the body. This medicine is used to treat iron deficiency anemia in people with chronic kidney disease. This medicine may be used for other purposes; ask your health care provider or pharmacist if you have questions. COMMON BRAND NAME(S): Venofer What should I tell my health care provider before I take this medicine? They need to know if you have any of these conditions:  anemia not caused by low iron levels  heart disease  high levels of iron in the blood  kidney disease  liver disease  an unusual or allergic reaction to iron, other medicines, foods, dyes, or preservatives  pregnant or trying to get pregnant  breast-feeding How should I use this medicine? This medicine is for infusion into a vein. It is given by a health care professional in a hospital or clinic setting. Talk to your pediatrician regarding the use of this medicine in children. While this drug may be prescribed for children as young as 2 years for selected conditions, precautions do apply. Overdosage: If you think you have taken too much of this medicine contact a poison control center or emergency room at once. NOTE: This medicine is only for you. Do not share this medicine with others. What if I miss a dose? It is important not to miss your dose. Call your doctor or health care professional if you are unable to keep an appointment. What may interact with this medicine? Do not take this medicine with any of the following medications:  deferoxamine  dimercaprol  other iron products This medicine may also interact with the following medications:  chloramphenicol  deferasirox This list may not  describe all possible interactions. Give your health care provider a list of all the medicines, herbs, non-prescription drugs, or dietary supplements you use. Also tell them if you smoke, drink alcohol, or use illegal drugs. Some items may interact with your medicine. What should I watch for while using this medicine? Visit your doctor or healthcare professional regularly. Tell your doctor or healthcare professional if your symptoms do not start to get better or if they get worse. You may need blood work done while you are taking this medicine. You may need to follow a special diet. Talk to your doctor. Foods that contain iron include: whole grains/cereals, dried fruits, beans, or peas, leafy green vegetables, and organ meats (liver, kidney). What side effects may I notice from receiving this medicine? Side effects that you should report to your doctor or health care professional as soon as possible:  allergic reactions like skin rash, itching or hives, swelling of the face, lips, or tongue  breathing problems  changes in blood pressure  cough  fast, irregular heartbeat  feeling faint or lightheaded, falls  fever or chills  flushing, sweating, or hot feelings  joint or muscle aches/pains  seizures  swelling of the ankles or feet  unusually weak or tired Side effects that usually do not require medical attention (report to your doctor or health care professional if they continue or are bothersome):  diarrhea  feeling achy  headache  irritation at site where injected  nausea, vomiting  stomach upset  tiredness This list may not describe all possible side effects. Call  your doctor for medical advice about side effects. You may report side effects to FDA at 1-800-FDA-1088. Where should I keep my medicine? This drug is given in a hospital or clinic and will not be stored at home. NOTE: This sheet is a summary. It may not cover all possible information. If you have questions  about this medicine, talk to your doctor, pharmacist, or health care provider.  2020 Elsevier/Gold Standard (2011-04-23 17:14:35)  _______________________________________________________________  Thank you for choosing [place] at [facility] to provide your oncology and hematology care.  To afford each patient quality time with our providers, please arrive at least 15 minutes before your scheduled appointment.  You need to re-schedule your appointment if you arrive 10 or more minutes late.  We strive to give you quality time with our providers, and arriving late affects you and other patients whose appointments are after yours.  Also, if you no show three or more times for appointments you may be dismissed from the clinic.  Again, thank you for choosing [place] at [place] hope is that these requests will allow you access to exceptional care and in a timely manner. _______________________________________________________________  If you have questions after your visit, please contact our office at [phone] between the hours of 8:30 a.m. and 5:00 p.m. Voicemails left after 4:30 p.m. will not be returned until the following business day. _______________________________________________________________  For prescription refill requests, have your pharmacy contact our office. _______________________________________________________________  Recommendations made by the consultant and any test results will be sent to your referring physician. _______________________________________________________________

## 2020-01-22 NOTE — Progress Notes (Signed)
Venofer  given today per MD orders. Tolerated infusion without adverse affects. Vital signs stable. No complaints at this time. Discharged from clinic ambulatory. F/U with [facility] as scheduled.

## 2020-01-22 NOTE — Progress Notes (Signed)
Jack Mejia presents today for IV iron infusion. Infusion tolerated without incident or complaint. See MAR for details. VSS prior to and post infusion. Discharged in satisfactory condition with follow up instructions.

## 2020-01-24 ENCOUNTER — Inpatient Hospital Stay (HOSPITAL_COMMUNITY): Payer: Medicare HMO

## 2020-01-24 VITALS — BP 155/62 | HR 62 | Temp 96.8°F | Resp 18

## 2020-01-24 DIAGNOSIS — R0602 Shortness of breath: Secondary | ICD-10-CM | POA: Diagnosis not present

## 2020-01-24 DIAGNOSIS — N179 Acute kidney failure, unspecified: Secondary | ICD-10-CM

## 2020-01-24 DIAGNOSIS — D508 Other iron deficiency anemias: Secondary | ICD-10-CM

## 2020-01-24 DIAGNOSIS — R69 Illness, unspecified: Secondary | ICD-10-CM | POA: Diagnosis not present

## 2020-01-24 DIAGNOSIS — D539 Nutritional anemia, unspecified: Secondary | ICD-10-CM | POA: Diagnosis not present

## 2020-01-24 DIAGNOSIS — I4891 Unspecified atrial fibrillation: Secondary | ICD-10-CM | POA: Diagnosis not present

## 2020-01-24 DIAGNOSIS — N183 Chronic kidney disease, stage 3 unspecified: Secondary | ICD-10-CM

## 2020-01-24 DIAGNOSIS — N189 Chronic kidney disease, unspecified: Secondary | ICD-10-CM | POA: Diagnosis not present

## 2020-01-24 DIAGNOSIS — D696 Thrombocytopenia, unspecified: Secondary | ICD-10-CM | POA: Diagnosis not present

## 2020-01-24 DIAGNOSIS — E1122 Type 2 diabetes mellitus with diabetic chronic kidney disease: Secondary | ICD-10-CM | POA: Diagnosis not present

## 2020-01-24 DIAGNOSIS — I2510 Atherosclerotic heart disease of native coronary artery without angina pectoris: Secondary | ICD-10-CM | POA: Diagnosis not present

## 2020-01-24 DIAGNOSIS — M129 Arthropathy, unspecified: Secondary | ICD-10-CM | POA: Diagnosis not present

## 2020-01-24 DIAGNOSIS — I129 Hypertensive chronic kidney disease with stage 1 through stage 4 chronic kidney disease, or unspecified chronic kidney disease: Secondary | ICD-10-CM | POA: Diagnosis not present

## 2020-01-24 MED ORDER — SODIUM CHLORIDE 0.9 % IV SOLN
200.0000 mg | Freq: Once | INTRAVENOUS | Status: AC
  Administered 2020-01-24: 200 mg via INTRAVENOUS
  Filled 2020-01-24: qty 200

## 2020-01-24 MED ORDER — SODIUM CHLORIDE 0.9 % IV SOLN: Freq: Once | INTRAVENOUS | Status: AC

## 2020-01-24 NOTE — Progress Notes (Signed)
Iron infusion given per orders. Patient tolerated it well without problems. Vitals stable and discharged home from clinic ambulatory. Follow up as scheduled.  

## 2020-01-25 ENCOUNTER — Ambulatory Visit (INDEPENDENT_AMBULATORY_CARE_PROVIDER_SITE_OTHER): Payer: Medicare HMO | Admitting: Nurse Practitioner

## 2020-01-25 ENCOUNTER — Encounter: Payer: Self-pay | Admitting: Nurse Practitioner

## 2020-01-25 ENCOUNTER — Ambulatory Visit: Payer: Self-pay

## 2020-01-25 ENCOUNTER — Other Ambulatory Visit: Payer: Self-pay

## 2020-01-25 VITALS — BP 126/84 | Temp 97.8°F | Wt 209.2 lb

## 2020-01-25 DIAGNOSIS — M25471 Effusion, right ankle: Secondary | ICD-10-CM

## 2020-01-25 DIAGNOSIS — M25571 Pain in right ankle and joints of right foot: Secondary | ICD-10-CM

## 2020-01-25 MED ORDER — PREDNISONE 20 MG PO TABS
ORAL_TABLET | ORAL | 0 refills | Status: DC
Start: 2020-01-25 — End: 2020-03-13

## 2020-01-25 NOTE — Patient Instructions (Addendum)
Apply Voltaren gel to right ankle (buy this over the counter) See instructions below for other tips  Gout  Gout is painful swelling of your joints. Gout is a type of arthritis. It is caused by having too much uric acid in your body. Uric acid is a chemical that is made when your body breaks down substances called purines. If your body has too much uric acid, sharp crystals can form and build up in your joints. This causes pain and swelling. Gout attacks can happen quickly and be very painful (acute gout). Over time, the attacks can affect more joints and happen more often (chronic gout). What are the causes?  Too much uric acid in your blood. This can happen because: ? Your kidneys do not remove enough uric acid from your blood. ? Your body makes too much uric acid. ? You eat too many foods that are high in purines. These foods include organ meats, some seafood, and beer.  Trauma or stress. What increases the risk?  Having a family history of gout.  Being male and middle-aged.  Being male and having gone through menopause.  Being very overweight (obese).  Drinking alcohol, especially beer.  Not having enough water in the body (being dehydrated).  Losing weight too quickly.  Having an organ transplant.  Having lead poisoning.  Taking certain medicines.  Having kidney disease.  Having a skin condition called psoriasis. What are the signs or symptoms? An attack of acute gout usually happens in just one joint. The most common place is the big toe. Attacks often start at night. Other joints that may be affected include joints of the feet, ankle, knee, fingers, wrist, or elbow. Symptoms of an attack may include:  Very bad pain.  Warmth.  Swelling.  Stiffness.  Shiny, red, or purple skin.  Tenderness. The affected joint may be very painful to touch.  Chills and fever. Chronic gout may cause symptoms more often. More joints may be involved. You may also have white or  yellow lumps (tophi) on your hands or feet or in other areas near your joints. How is this treated?  Treatment for this condition has two phases: treating an acute attack and preventing future attacks.  Acute gout treatment may include: ? NSAIDs. ? Steroids. These are taken by mouth or injected into a joint. ? Colchicine. This medicine relieves pain and swelling. It can be given by mouth or through an IV tube.  Preventive treatment may include: ? Taking small doses of NSAIDs or colchicine daily. ? Using a medicine that reduces uric acid levels in your blood. ? Making changes to your diet. You may need to see a food expert (dietitian) about what to eat and drink to prevent gout. Follow these instructions at home: During a gout attack   If told, put ice on the painful area: ? Put ice in a plastic bag. ? Place a towel between your skin and the bag. ? Leave the ice on for 20 minutes, 2-3 times a day.  Raise (elevate) the painful joint above the level of your heart as often as you can.  Rest the joint as much as possible. If the joint is in your leg, you may be given crutches.  Follow instructions from your doctor about what you cannot eat or drink. Avoiding future gout attacks  Eat a low-purine diet. Avoid foods and drinks such as: ? Liver. ? Kidney. ? Anchovies. ? Asparagus. ? Herring. ? Mushrooms. ? Mussels. ? Beer.  Stay  at a healthy weight. If you want to lose weight, talk with your doctor. Do not lose weight too fast.  Start or continue an exercise plan as told by your doctor. Eating and drinking  Drink enough fluids to keep your pee (urine) pale yellow.  If you drink alcohol: ? Limit how much you use to:  0-1 drink a day for women.  0-2 drinks a day for men. ? Be aware of how much alcohol is in your drink. In the [place] one drink equals one 12 oz bottle of beer (355 mL), one 5 oz glass of wine (148 mL), or one 1 oz glass of hard liquor (44 mL). General  instructions  Take over-the-counter and prescription medicines only as told by your doctor.  Do not drive or use heavy machinery while taking prescription pain medicine.  Return to your normal activities as told by your doctor. Ask your doctor what activities are safe for you.  Keep all follow-up visits as told by your doctor. This is important. Contact a doctor if:  You have another gout attack.  You still have symptoms of a gout attack after 10 days of treatment.  You have problems (side effects) because of your medicines.  You have chills or a fever.  You have burning pain when you pee (urinate).  You have pain in your lower back or belly. Get help right away if:  You have very bad pain.  Your pain cannot be controlled.  You cannot pee. Summary  Gout is painful swelling of the joints.  The most common site of pain is the big toe, but it can affect other joints.  Medicines and avoiding some foods can help to prevent and treat gout attacks. This information is not intended to replace advice given to you by your health care provider. Make sure you discuss any questions you have with your health care provider. Document Revised: 02/02/2018 Document Reviewed: 02/02/2018 Elsevier Patient Education  [place].

## 2020-01-25 NOTE — Progress Notes (Signed)
   Subjective:    Patient ID: Jack Mejia, male    DOB: 07/21/1946, 74 y.o.   MRN: [id number]  HPI  Patient arrives with swelling in his right ankle and foot since Monday. No known injury- someone mentioned possible gout to him. Has had several iron infusions. Began Monday night but mainly Tuesday morning. Spoke with hematology who advised him to see primary care provider.  No fever. Medial right and anterior ankle very tender. Difficulty putting weight on foot due to pain. No breaks in the skin. Slightly better today since he has been off his foot. No previous history of gout. Review of Systems     Objective:   Physical Exam NAD. Alert, oriented. Lungs clear. Heart RRR. Faint discoloration noted around anterior portion of both lower legs. Distinct edema noted at the right medial malleolus which is very tender to slight touch. Slightly warm with mild erythema. Otherwise no tenderness with palpation. DP pulse present; skin warm with normal cap refill in the foot and toes. Discomfort noted with standing and putting pressure on the foot.        Assessment & Plan:  Pain and swelling of right ankle - Plan: Uric acid  Possible gout; first episode  Meds ordered this encounter  Medications  . predniSONE (DELTASONE) 20 MG tablet    Sig: 3 po qd x 3 d then 2 po qd x 3 d then 1 po qd x 2 d    Dispense:  17 tablet    Refill:  0    Order Specific Question:   Supervising Provider    Answer:   Sallee Lange A [[id number]]   Voltaren gel as directed. Due to decreased GFR and use of Eliquis, no oral NSAIDs. Patient had a rash after prolonged use of Prednisone but is willing to try again. Will prescribe Prednisone due to potential interactions between colchicine and other meds. Given written and verbal information on gout. Cold compresses and elevation.  Uric acid pending. Call back in the meantime if new or worsening symptoms.

## 2020-01-26 LAB — URIC ACID: Uric Acid: 13.2 mg/dL — ABNORMAL HIGH (ref 3.8–8.4)

## 2020-02-06 ENCOUNTER — Telehealth: Payer: Self-pay | Admitting: Family Medicine

## 2020-02-06 NOTE — Telephone Encounter (Signed)
Pt needs to be seen. Dr. Lovena Le

## 2020-02-06 NOTE — Telephone Encounter (Signed)
Spoke with Jack Mejia. Ann states gout has moved into other leg and knee now. Pt in pain and can barely walk. Informed patient that we do not have any appointments today and that pt will need to be seen. Pt set up for tomorrow at 3:50 pm. Jack Mejia wanting to know if something can be called in today to [facility]. Pt was on Prednisone but has completed the course of Prednisone. Please advise. Thank you

## 2020-02-06 NOTE — Telephone Encounter (Signed)
Ann contacted and verbalized understanding. Lelon Frohlich states that patient says he is not coming in because he knows what it is and just wants meds called in. Informed Ann that he need to be seen in order to get  med. Ann verbalized understanding

## 2020-02-06 NOTE — Telephone Encounter (Signed)
Patient was seen on 7/1 with gout and prescribe prednisone but now the gout has move to the other leg and into his knee. Patient is requesting refill on prednisone to be called into [facility] [place] . Call ([phone]

## 2020-02-07 ENCOUNTER — Ambulatory Visit: Payer: Medicare HMO | Admitting: Family Medicine

## 2020-02-07 NOTE — Telephone Encounter (Signed)
Patient called this morning to cancel 3:50 appointment today

## 2020-03-11 ENCOUNTER — Inpatient Hospital Stay (HOSPITAL_COMMUNITY): Payer: Medicare HMO | Attending: Hematology

## 2020-03-11 ENCOUNTER — Other Ambulatory Visit: Payer: Self-pay

## 2020-03-11 DIAGNOSIS — Z87442 Personal history of urinary calculi: Secondary | ICD-10-CM | POA: Insufficient documentation

## 2020-03-11 DIAGNOSIS — F329 Major depressive disorder, single episode, unspecified: Secondary | ICD-10-CM | POA: Insufficient documentation

## 2020-03-11 DIAGNOSIS — R748 Abnormal levels of other serum enzymes: Secondary | ICD-10-CM | POA: Insufficient documentation

## 2020-03-11 DIAGNOSIS — Z79899 Other long term (current) drug therapy: Secondary | ICD-10-CM | POA: Insufficient documentation

## 2020-03-11 DIAGNOSIS — Z7984 Long term (current) use of oral hypoglycemic drugs: Secondary | ICD-10-CM | POA: Diagnosis not present

## 2020-03-11 DIAGNOSIS — N189 Chronic kidney disease, unspecified: Secondary | ICD-10-CM | POA: Diagnosis not present

## 2020-03-11 DIAGNOSIS — R69 Illness, unspecified: Secondary | ICD-10-CM | POA: Diagnosis not present

## 2020-03-11 DIAGNOSIS — R5383 Other fatigue: Secondary | ICD-10-CM | POA: Diagnosis not present

## 2020-03-11 DIAGNOSIS — R0602 Shortness of breath: Secondary | ICD-10-CM | POA: Insufficient documentation

## 2020-03-11 DIAGNOSIS — I129 Hypertensive chronic kidney disease with stage 1 through stage 4 chronic kidney disease, or unspecified chronic kidney disease: Secondary | ICD-10-CM | POA: Diagnosis not present

## 2020-03-11 DIAGNOSIS — M129 Arthropathy, unspecified: Secondary | ICD-10-CM | POA: Diagnosis not present

## 2020-03-11 DIAGNOSIS — I4891 Unspecified atrial fibrillation: Secondary | ICD-10-CM | POA: Insufficient documentation

## 2020-03-11 DIAGNOSIS — Z87891 Personal history of nicotine dependence: Secondary | ICD-10-CM | POA: Diagnosis not present

## 2020-03-11 DIAGNOSIS — E7800 Pure hypercholesterolemia, unspecified: Secondary | ICD-10-CM | POA: Diagnosis not present

## 2020-03-11 DIAGNOSIS — I2510 Atherosclerotic heart disease of native coronary artery without angina pectoris: Secondary | ICD-10-CM | POA: Diagnosis not present

## 2020-03-11 DIAGNOSIS — Z7901 Long term (current) use of anticoagulants: Secondary | ICD-10-CM | POA: Diagnosis not present

## 2020-03-11 DIAGNOSIS — E119 Type 2 diabetes mellitus without complications: Secondary | ICD-10-CM | POA: Diagnosis not present

## 2020-03-11 DIAGNOSIS — D509 Iron deficiency anemia, unspecified: Secondary | ICD-10-CM | POA: Insufficient documentation

## 2020-03-11 DIAGNOSIS — D539 Nutritional anemia, unspecified: Secondary | ICD-10-CM

## 2020-03-11 LAB — CBC WITH DIFFERENTIAL/PLATELET
Abs Immature Granulocytes: 0.15 10*3/uL — ABNORMAL HIGH (ref 0.00–0.07)
Basophils Absolute: 0 10*3/uL (ref 0.0–0.1)
Basophils Relative: 1 %
Eosinophils Absolute: 0.1 10*3/uL (ref 0.0–0.5)
Eosinophils Relative: 1 %
HCT: 29.3 % — ABNORMAL LOW (ref 39.0–52.0)
Hemoglobin: 9.7 g/dL — ABNORMAL LOW (ref 13.0–17.0)
Immature Granulocytes: 3 %
Lymphocytes Relative: 27 %
Lymphs Abs: 1.3 10*3/uL (ref 0.7–4.0)
MCH: 38.6 pg — ABNORMAL HIGH (ref 26.0–34.0)
MCHC: 33.1 g/dL (ref 30.0–36.0)
MCV: 116.7 fL — ABNORMAL HIGH (ref 80.0–100.0)
Monocytes Absolute: 1.2 10*3/uL — ABNORMAL HIGH (ref 0.1–1.0)
Monocytes Relative: 26 %
Neutro Abs: 1.9 10*3/uL (ref 1.7–7.7)
Neutrophils Relative %: 42 %
Platelets: 124 10*3/uL — ABNORMAL LOW (ref 150–400)
RBC: 2.51 MIL/uL — ABNORMAL LOW (ref 4.22–5.81)
RDW: 15 % (ref 11.5–15.5)
WBC: 4.7 10*3/uL (ref 4.0–10.5)
nRBC: 0 % (ref 0.0–0.2)

## 2020-03-11 LAB — FERRITIN: Ferritin: 496 ng/mL — ABNORMAL HIGH (ref 24–336)

## 2020-03-11 LAB — IRON AND TIBC
Iron: 126 ug/dL (ref 45–182)
Saturation Ratios: 40 % — ABNORMAL HIGH (ref 17.9–39.5)
TIBC: 317 ug/dL (ref 250–450)
UIBC: 191 ug/dL

## 2020-03-13 ENCOUNTER — Telehealth: Payer: Self-pay | Admitting: *Deleted

## 2020-03-13 ENCOUNTER — Other Ambulatory Visit: Payer: Self-pay

## 2020-03-13 ENCOUNTER — Inpatient Hospital Stay (HOSPITAL_COMMUNITY): Payer: Medicare HMO | Admitting: Nurse Practitioner

## 2020-03-13 DIAGNOSIS — D539 Nutritional anemia, unspecified: Secondary | ICD-10-CM | POA: Diagnosis not present

## 2020-03-13 DIAGNOSIS — I2510 Atherosclerotic heart disease of native coronary artery without angina pectoris: Secondary | ICD-10-CM | POA: Diagnosis not present

## 2020-03-13 DIAGNOSIS — I4891 Unspecified atrial fibrillation: Secondary | ICD-10-CM | POA: Diagnosis not present

## 2020-03-13 DIAGNOSIS — R69 Illness, unspecified: Secondary | ICD-10-CM | POA: Diagnosis not present

## 2020-03-13 DIAGNOSIS — I129 Hypertensive chronic kidney disease with stage 1 through stage 4 chronic kidney disease, or unspecified chronic kidney disease: Secondary | ICD-10-CM | POA: Diagnosis not present

## 2020-03-13 DIAGNOSIS — E119 Type 2 diabetes mellitus without complications: Secondary | ICD-10-CM | POA: Diagnosis not present

## 2020-03-13 DIAGNOSIS — D509 Iron deficiency anemia, unspecified: Secondary | ICD-10-CM | POA: Diagnosis not present

## 2020-03-13 DIAGNOSIS — N189 Chronic kidney disease, unspecified: Secondary | ICD-10-CM | POA: Diagnosis not present

## 2020-03-13 DIAGNOSIS — R0602 Shortness of breath: Secondary | ICD-10-CM | POA: Diagnosis not present

## 2020-03-13 DIAGNOSIS — R5383 Other fatigue: Secondary | ICD-10-CM | POA: Diagnosis not present

## 2020-03-13 DIAGNOSIS — M129 Arthropathy, unspecified: Secondary | ICD-10-CM | POA: Diagnosis not present

## 2020-03-13 NOTE — Telephone Encounter (Signed)
Spoke with caregiver who states that pt was seen by Francene Finders, NP-C today in oncology and suggested that his Eliquis is cut back to 2.5 mg BID d/t anemia.

## 2020-03-13 NOTE — Assessment & Plan Note (Signed)
1. Microcytic anemia: -She is seen at the request of Dr. Wolfgang Phoenix for microcytic anemia -CTAP on 04/15/2017 showed no splenomegaly. -Patient has microcytosis noted since February 2020, gradually worsening. -Colonoscopy on 06/05/2010 showed small polyps in the sigmoid colon, right colon, hepatic flexure and transverse colon. -He reports blood on his tissue paper since starting Eliquis. He reports that this is not regularly now. -Work-up included mild thrombocytopenia. Serum erythropoietin was 77.2, SPEP was negative. Other nutritional deficiencies were ruled out. -Differential diagnosis includes anemia from combination of CKD and early MDS. -Patient does not want a bone marrow biopsy at this time. He wishes to talk about this at his next visit. -He had 5 infusions of Venofer at the end of June 2021. -Labs done on 03/11/2020 showed hemoglobin 9.7, ferritin 496, percent saturation 40 -He will follow-up in 4 weeks with repeat labs.  2. CKD: -History of CKD since 1017. -Renal ultrasound on 04/21/2019 shows bilateral renal cysts. -Creatinine baseline is around 2.3  3. Atrial fibrillation: -Continue Eliquis 5 mg twice daily.

## 2020-03-13 NOTE — Progress Notes (Signed)
[place], [place] [address]   CLINIC:  Medical Oncology/Hematology  PCP:  Erven Colla, DO [place] [address] [phone]   REASON FOR VISIT: Follow-up for microcytic anemia   CURRENT THERAPY: Intermittent iron infusions   INTERVAL HISTORY:  [name] 74 y.o. male returns for routine follow-up for microcytic anemia. Patient reports he is still fatigued and slightly short of breath. He reports only occasional blood when he goes to the bathroom however this is not daily. Denies any nausea, vomiting, or diarrhea. Denies any new pains. Had not noticed any recent bleeding such as epistaxis or hematuria. Denies recent chest pain on exertion, pre-syncopal episodes, or palpitations. Denies any numbness or tingling in hands or feet. Denies any recent fevers, infections, or recent hospitalizations. Patient reports appetite at 100% and energy level at 25%. He is eating well maintain his weight this time.     REVIEW OF SYSTEMS:  Review of Systems  Constitutional: Positive for fatigue.  Respiratory: Positive for shortness of breath.   Neurological: Positive for dizziness.  Psychiatric/Behavioral: Positive for depression and sleep disturbance. The patient is nervous/anxious.   All other systems reviewed and are negative.    PAST MEDICAL/SURGICAL HISTORY:  Past Medical History:  Diagnosis Date  . Arthritis   . Atrial fibrillation ([place])    a. s/p DCCV in 03/2019  . CAD (coronary artery disease) 06/29/2019  . Cramps of left lower extremity   . Depression   . Diabetes mellitus    type 2 for 7-8 yrs  . Dyspnea   . Elevated liver enzymes   . Fatty liver   . History of kidney stones   . Hyperlipidemia   . Hypertension   . Vertigo    Past Surgical History:  Procedure Laterality Date  . CARDIOVERSION N/A 04/21/2019   Procedure: CARDIOVERSION;  Surgeon: Sanda Klein, MD;  Location: [facility];  Service: Cardiovascular;   Laterality: N/A;  . CARPAL TUNNEL RELEASE     both wrist  . cataract surgery     bilateral  . CHOLECYSTECTOMY    . EYE SURGERY    . HEMORROIDECTOMY    . KNEE SURGERY Left   . LEFT HEART CATH AND CORONARY ANGIOGRAPHY N/A 05/23/2019   Procedure: LEFT HEART CATH AND CORONARY ANGIOGRAPHY;  Surgeon: Belva Crome, MD;  Location: [facility];  Service: Cardiovascular;  Laterality: N/A;  . LUMBAR LAMINECTOMY/DECOMPRESSION MICRODISCECTOMY N/A 08/06/2016   Procedure: LUMBAR THREE- LUMBAR FIVE  DECOMPRESSIVE LUMBAR LAMINECTOMY;  Surgeon: Jovita Gamma, MD;  Location: [facility];  Service: Neurosurgery;  Laterality: N/A;     SOCIAL HISTORY:  Social History   Socioeconomic History  . Marital status: Divorced    Spouse name: Not on file  . Number of children: 2  . Years of education: Not on file  . Highest education level: Not on file  Occupational History  . Occupation: RETIRED  Tobacco Use  . Smoking status: Former Smoker    Packs/day: 4.00    Years: 20.00    Pack years: 80.00    Types: Cigarettes    Quit date: 10/14/1982    Years since quitting: 37.4  . Smokeless tobacco: Never Used  . Tobacco comment: 29 yrs ago  Vaping Use  . Vaping Use: Never used  Substance and Sexual Activity  . Alcohol use: No  . Drug use: No  . Sexual activity: Not Currently    Birth control/protection: None  Other Topics  Concern  . Not on file  Social History Narrative  . Not on file   Social Determinants of Health   Financial Resource Strain:   . Difficulty of Paying Living Expenses:   Food Insecurity:   . Worried About Charity fundraiser in the Last Year:   . Arboriculturist in the Last Year:   Transportation Needs:   . Film/video editor (Medical):   [name] Lack of Transportation (Non-Medical):   Physical Activity:   . Days of Exercise per Week:   . Minutes of Exercise per Session:   Stress:   . Feeling of Stress :   Social Connections:   . Frequency of Communication with Friends  and Family:   . Frequency of Social Gatherings with Friends and Family:   . Attends Religious Services:   . Active Member of Clubs or Organizations:   . Attends Archivist Meetings:   [name] Marital Status:   Intimate Partner Violence:   . Fear of Current or Ex-Partner:   . Emotionally Abused:   [name] Physically Abused:   . Sexually Abused:     FAMILY HISTORY:  Family History  Problem Relation Age of Onset  . Diabetes Mother   . Cancer Mother        unknown kind  . Diabetes Sister   . COPD Sister   . Liver disease Brother   . COPD Brother   . Diabetes Maternal Grandmother   . Diabetes Brother   . COPD Sister   . Healthy Daughter   . Healthy Daughter     CURRENT MEDICATIONS:  Outpatient Encounter Medications as of 03/13/2020  Medication Sig  . amiodarone (PACERONE) 200 MG tablet TAKE 1 TABLET BY MOUTH EVERY DAY  . apixaban (ELIQUIS) 5 MG TABS tablet Take 1 tablet (5 mg total) by mouth 2 (two) times daily.  [name] atorvastatin (LIPITOR) 20 MG tablet Take 1 tablet (20 mg total) by mouth daily.  [name] diltiazem (CARDIZEM CD) 360 MG 24 hr capsule TAKE 1 CAPSULE BY MOUTH EVERY DAY  . glipiZIDE (GLUCOTROL) 5 MG tablet Take 2 tablets (10 mg total) by mouth 2 (two) times daily.  . metFORMIN (GLUCOPHAGE) 500 MG tablet Take 2 tablets (1,000 mg total) by mouth 2 (two) times daily.  . metoprolol tartrate (LOPRESSOR) 25 MG tablet TAKE 1 TABLET BY MOUTH TWICE A DAY  . torsemide (DEMADEX) 20 MG tablet Take 1 tablet (20 mg total) by mouth 2 (two) times daily.  . [DISCONTINUED] augmented betamethasone dipropionate (DIPROLENE-AF) 0.05 % cream Apply 1 application topically 2 (two) times daily. As needed.  . [DISCONTINUED] cimetidine (TAGAMET) 400 MG tablet Take 400 mg by mouth 3 (three) times daily.  . [DISCONTINUED] Cyanocobalamin (VITAMIN B 12 PO) Take 2,500 mcg by mouth daily.  . [DISCONTINUED] famotidine (PEPCID) 40 MG tablet Take 40 mg by mouth daily.  . [DISCONTINUED] predniSONE (DELTASONE) 20  MG tablet 3 po qd x 3 d then 2 po qd x 3 d then 1 po qd x 2 d   No facility-administered encounter medications on file as of 03/13/2020.    ALLERGIES:  Allergies  Allergen Reactions  . Demerol Nausea And Vomiting  . Vasotec [Enalapril] Cough  . Lasix [Furosemide] Rash     PHYSICAL EXAM:  ECOG Performance status: 1  Vitals:   03/13/20 1444  BP: (!) 145/68  Pulse: 63  Resp: 18  Temp: (!) 96.9 F (36.1 C)  SpO2: 98%   Filed Weights  03/13/20 1444  Weight: 207 lb 10.8 oz (94.2 kg)   Physical Exam Constitutional:      Appearance: Normal appearance. He is normal weight.  Cardiovascular:     Rate and Rhythm: Normal rate and regular rhythm.     Heart sounds: Normal heart sounds.  Pulmonary:     Effort: Pulmonary effort is normal.     Breath sounds: Normal breath sounds.  Abdominal:     General: Bowel sounds are normal.     Palpations: Abdomen is soft.  Musculoskeletal:        General: Normal range of motion.  Skin:    General: Skin is warm.  Neurological:     Mental Status: He is alert and oriented to person, place, and time. Mental status is at baseline.  Psychiatric:        Mood and Affect: Mood normal.        Behavior: Behavior normal.        Thought Content: Thought content normal.        Judgment: Judgment normal.      LABORATORY DATA:  I have reviewed the labs as listed.  CBC    Component Value Date/Time   WBC 4.7 03/11/2020 1331   RBC 2.51 (L) 03/11/2020 1331   HGB 9.7 (L) 03/11/2020 1331   HGB 11.9 (L) 09/07/2018 1638   HCT 29.3 (L) 03/11/2020 1331   HCT 34.2 (L) 09/07/2018 1638   PLT 124 (L) 03/11/2020 1331   PLT 229 09/07/2018 1638   MCV 116.7 (H) 03/11/2020 1331   MCV 101 (H) 09/07/2018 1638   MCH 38.6 (H) 03/11/2020 1331   MCHC 33.1 03/11/2020 1331   RDW 15.0 03/11/2020 1331   RDW 12.8 09/07/2018 1638   LYMPHSABS 1.3 03/11/2020 1331   LYMPHSABS 1.3 09/07/2018 1638   MONOABS 1.2 (H) 03/11/2020 1331   EOSABS 0.1 03/11/2020 1331    EOSABS 0.2 09/07/2018 1638   BASOSABS 0.0 03/11/2020 1331   BASOSABS 0.1 09/07/2018 1638   CMP Latest Ref Rng & Units 12/14/2019 12/12/2019 11/01/2019  Glucose 70 - 99 mg/dL 214(H) 318(H) 171(H)  BUN 8 - 23 mg/dL 41(H) 36(H) 24(H)  Creatinine 0.61 - 1.24 mg/dL 2.39(H) 2.47(H) 1.79(H)  Sodium 135 - 145 mmol/L 142 137 138  Potassium 3.5 - 5.1 mmol/L 4.6 4.1 4.6  Chloride 98 - 111 mmol/L 98 96 103  CO2 22 - 32 mmol/L $RemoveB'29 23 23  'BvSAEWFm$ Calcium 8.9 - 10.3 mg/dL 9.2 8.9 9.4  Total Protein 6.5 - 8.1 g/dL 7.8 - 7.3  Total Bilirubin 0.3 - 1.2 mg/dL 1.1 - 0.9  Alkaline Phos 38 - 126 U/L 90 - 79  AST 15 - 41 U/L 28 - 20  ALT 0 - 44 U/L 29 - 19    All questions were answered to patient's stated satisfaction. Encouraged patient to call with any new concerns or questions before his next visit to the cancer center and we can certain see him sooner, if needed.     ASSESSMENT & PLAN:  Macrocytic anemia 1. Microcytic anemia: -She is seen at the request of Dr. Wolfgang Phoenix for microcytic anemia -CTAP on 04/15/2017 showed no splenomegaly. -Patient has microcytosis noted since February 2020, gradually worsening. -Colonoscopy on 06/05/2010 showed small polyps in the sigmoid colon, right colon, hepatic flexure and transverse colon. -He reports blood on his tissue paper since starting Eliquis. He reports that this is not regularly now. -Work-up included mild thrombocytopenia. Serum erythropoietin was 77.2, SPEP was negative. Other nutritional deficiencies were  ruled out. -Differential diagnosis includes anemia from combination of CKD and early MDS. -Patient does not want a bone marrow biopsy at this time. He wishes to talk about this at his next visit. -He had 5 infusions of Venofer at the end of June 2021. -Labs done on 03/11/2020 showed hemoglobin 9.7, ferritin 496, percent saturation 40 -He will follow-up in 4 weeks with repeat labs.  2. CKD: -History of CKD since 1017. -Renal ultrasound on 04/21/2019 shows bilateral  renal cysts. -Creatinine baseline is around 2.3  3. Atrial fibrillation: -Continue Eliquis 5 mg twice daily.     Orders placed this encounter:  Orders Placed This Encounter  Procedures  . CBC with Differential/Platelet  . Comprehensive metabolic panel  . Ferritin  . Iron and TIBC  . Lactate dehydrogenase  . Vitamin B12  . VITAMIN D 25 Hydroxy (Vit-D Deficiency, Fractures)      Francene Finders, FNP-C [place] [phone]

## 2020-03-14 NOTE — Telephone Encounter (Signed)
New message    Jack Mejia returning call to [facility]

## 2020-03-14 NOTE — Telephone Encounter (Signed)
Called to notified Jack Mejia caregiver of Dr. Myles Gip note. No answer. Left msg to call back.

## 2020-03-14 NOTE — Telephone Encounter (Signed)
I reviewed the chart, this is a former patient of Dr. Bronson Ing.  He is on Eliquis for stroke prophylaxis with atrial fibrillation.  I reviewed the hematology note from yesterday which actually says to continue Eliquis at 5 mg twice daily.  I see that he has microcytic anemia, with intermittent hematochezia described, but reportedly improving, history of hemorrhoids and some colonic polyps.  Based on his age, weight, and renal function, Eliquis 5 mg twice daily would be the therapeutic dose for effective stroke prophylaxis.  If he were to cut this back to 2.5 mg twice daily, it is not clear that he would get the same stroke prophylaxis benefit.  Having said that, if he continues to have bleeding problems, this would be a consideration, sometimes we even have to stop anticoagulation, but there is not a replacement that would effectively reduce his stroke risk.  It would seem to me that he should continue Eliquis at 5 mg twice daily in review of hematology note from yesterday.

## 2020-03-14 NOTE — Telephone Encounter (Signed)
Dr. Myles Gip notes read to Jack Mejia. She voiced understanding.

## 2020-03-19 ENCOUNTER — Encounter (HOSPITAL_COMMUNITY): Payer: Self-pay | Admitting: Nurse Practitioner

## 2020-03-20 ENCOUNTER — Other Ambulatory Visit (HOSPITAL_COMMUNITY): Payer: Self-pay | Admitting: Nurse Practitioner

## 2020-03-20 ENCOUNTER — Other Ambulatory Visit (HOSPITAL_COMMUNITY): Payer: Self-pay | Admitting: *Deleted

## 2020-03-20 DIAGNOSIS — D539 Nutritional anemia, unspecified: Secondary | ICD-10-CM

## 2020-03-23 ENCOUNTER — Other Ambulatory Visit: Payer: Self-pay

## 2020-03-23 ENCOUNTER — Emergency Department (HOSPITAL_COMMUNITY)
Admission: EM | Admit: 2020-03-23 | Discharge: 2020-03-23 | Disposition: A | Discharge status: Home / Self Care | Payer: Medicare HMO | Attending: Emergency Medicine | Admitting: Emergency Medicine

## 2020-03-23 ENCOUNTER — Encounter (HOSPITAL_COMMUNITY): Payer: Self-pay | Admitting: Emergency Medicine

## 2020-03-23 ENCOUNTER — Emergency Department (HOSPITAL_COMMUNITY): Payer: Medicare HMO

## 2020-03-23 DIAGNOSIS — I2510 Atherosclerotic heart disease of native coronary artery without angina pectoris: Secondary | ICD-10-CM | POA: Insufficient documentation

## 2020-03-23 DIAGNOSIS — Z7984 Long term (current) use of oral hypoglycemic drugs: Secondary | ICD-10-CM | POA: Insufficient documentation

## 2020-03-23 DIAGNOSIS — I10 Essential (primary) hypertension: Secondary | ICD-10-CM | POA: Diagnosis not present

## 2020-03-23 DIAGNOSIS — Y929 Unspecified place or not applicable: Secondary | ICD-10-CM | POA: Diagnosis not present

## 2020-03-23 DIAGNOSIS — W19XXXA Unspecified fall, initial encounter: Secondary | ICD-10-CM | POA: Insufficient documentation

## 2020-03-23 DIAGNOSIS — Z79899 Other long term (current) drug therapy: Secondary | ICD-10-CM | POA: Insufficient documentation

## 2020-03-23 DIAGNOSIS — Y939 Activity, unspecified: Secondary | ICD-10-CM | POA: Diagnosis not present

## 2020-03-23 DIAGNOSIS — S7002XA Contusion of left hip, initial encounter: Secondary | ICD-10-CM | POA: Diagnosis not present

## 2020-03-23 DIAGNOSIS — Z7901 Long term (current) use of anticoagulants: Secondary | ICD-10-CM | POA: Insufficient documentation

## 2020-03-23 DIAGNOSIS — E119 Type 2 diabetes mellitus without complications: Secondary | ICD-10-CM | POA: Diagnosis not present

## 2020-03-23 DIAGNOSIS — M1612 Unilateral primary osteoarthritis, left hip: Secondary | ICD-10-CM | POA: Diagnosis not present

## 2020-03-23 DIAGNOSIS — Y999 Unspecified external cause status: Secondary | ICD-10-CM | POA: Insufficient documentation

## 2020-03-23 DIAGNOSIS — Z87891 Personal history of nicotine dependence: Secondary | ICD-10-CM | POA: Diagnosis not present

## 2020-03-23 DIAGNOSIS — Z043 Encounter for examination and observation following other accident: Secondary | ICD-10-CM | POA: Diagnosis not present

## 2020-03-23 LAB — CBG MONITORING, ED: Glucose-Capillary: 228 mg/dL — ABNORMAL HIGH (ref 70–99)

## 2020-03-23 MED ORDER — NAPROXEN 500 MG PO TABS: 500.0000 mg | ORAL_TABLET | Freq: Two times a day (BID) | ORAL | 0 refills | Status: DC

## 2020-03-23 MED ORDER — OXYCODONE-ACETAMINOPHEN 5-325 MG PO TABS
2.0000 | ORAL_TABLET | Freq: Once | ORAL | Status: AC
  Administered 2020-03-23: 2 via ORAL
  Filled 2020-03-23: qty 2

## 2020-03-23 MED ORDER — METHOCARBAMOL 500 MG PO TABS: 500.0000 mg | ORAL_TABLET | Freq: Two times a day (BID) | ORAL | 0 refills | Status: DC | PRN

## 2020-03-23 NOTE — Discharge Instructions (Signed)
Your xray shows no fractures You problably have some muscle inflammation around the hip Take naproxen twice daily - only if needed - take this for no longer than one week Robaxin twice daily as needed for muscle spasm. Ice packs intermittently (wrapped in a towel), Rest - and seek a medical exam with Dr. Aline Brochure here locally for orthopedic follow up. You may benefit from physical therapy as well.

## 2020-03-23 NOTE — ED Triage Notes (Signed)
Pt fell in his home on Thursday of last week landing on his left side. Pt states he hit his head on a chair and takes eliquis 5mg  BID. Denies headache. Pt is complaining of left hiip pain at this time.

## 2020-03-23 NOTE — ED Provider Notes (Signed)
[facility] EMERGENCY DEPARTMENT Provider Note   CSN: [id number] Arrival date & time: 03/23/20  1256     History Chief Complaint  Patient presents with  . Fall    Jack Mejia is a 74 y.o. male.  HPI   This patient is a 74 year old male with a history of atrial fibrillation on Eliquis, he also is known to have diabetes, hyperlipidemia and hypertension.  He has chronic dyspnea for which she has undergone a work-up with his family doctor.  He presents with left-sided hip pain which has been going on for several days after he had a fall.  It was accidental after he had a flareup of his gout in the right ankle.  He had some leftover prednisone from a prior attack in the last couple of months so he started taking the prednisone, he has noticed that he is having increasing pain in his left hip when he tries to walk or move it.  It is almost a constant pain but particularly bad when he tries to stand pivot or bear any weight on that side.  He uses a cane at baseline, he had a friend bring him to the hospital today for further evaluation.  He was not able to be seen by his family doctor according to the patient.  He does endorse striking his left arm and his head but has no headache, no neurologic symptoms and no arm pain.  Past Medical History:  Diagnosis Date  . Arthritis   . Atrial fibrillation ([place])    a. s/p DCCV in 03/2019  . CAD (coronary artery disease) 06/29/2019  . Cramps of left lower extremity   . Depression   . Diabetes mellitus    type 2 for 7-8 yrs  . Dyspnea   . Elevated liver enzymes   . Fatty liver   . History of kidney stones   . Hyperlipidemia   . Hypertension   . Vertigo     Patient Active Problem List   Diagnosis Date Noted  . Anemia, iron deficiency 01/01/2020  . Macrocytic anemia 12/14/2019  . Coronary artery disease involving native coronary artery of native heart without angina pectoris   . Acute-on-chronic kidney injury ([place]) 04/21/2019  . Acute on  chronic diastolic (congestive) heart failure ([place]) 04/15/2019  . Atrial fibrillation with rapid ventricular response ([place]) 02/21/2019  . Depression, major, single episode, mild ([place]) 03/01/2017  . Lumbar stenosis with neurogenic claudication 08/06/2016  . Erectile dysfunction 12/05/2012  . Sensory neuropathy 12/05/2012  . Hyperlipidemia LDL goal <100 12/05/2012  . Rectal bleeding 10/13/2012  . Hypertension 07/07/2011  . Diabetes mellitus ([place]) 07/07/2011  . Fatty liver 07/07/2011    Past Surgical History:  Procedure Laterality Date  . CARDIOVERSION N/A 04/21/2019   Procedure: CARDIOVERSION;  Surgeon: Sanda Klein, MD;  Location: [facility];  Service: Cardiovascular;  Laterality: N/A;  . CARPAL TUNNEL RELEASE     both wrist  . cataract surgery     bilateral  . CHOLECYSTECTOMY    . EYE SURGERY    . HEMORROIDECTOMY    . KNEE SURGERY Left   . LEFT HEART CATH AND CORONARY ANGIOGRAPHY N/A 05/23/2019   Procedure: LEFT HEART CATH AND CORONARY ANGIOGRAPHY;  Surgeon: Belva Crome, MD;  Location: [place] CV LAB;  Service: Cardiovascular;  Laterality: N/A;  . LUMBAR LAMINECTOMY/DECOMPRESSION MICRODISCECTOMY N/A 08/06/2016   Procedure: LUMBAR THREE- LUMBAR FIVE  DECOMPRESSIVE LUMBAR LAMINECTOMY;  Surgeon: Jovita Gamma, MD;  Location: [place];  Service: Neurosurgery;  Laterality: N/A;       Family History  Problem Relation Age of Onset  . Diabetes Mother   . Cancer Mother        unknown kind  . Diabetes Sister   . COPD Sister   . Liver disease Brother   . COPD Brother   . Diabetes Maternal Grandmother   . Diabetes Brother   . COPD Sister   . Healthy Daughter   . Healthy Daughter     Social History   Tobacco Use  . Smoking status: Former Smoker    Packs/day: 4.00    Years: 20.00    Pack years: 80.00    Types: Cigarettes    Quit date: 10/14/1982    Years since quitting: 37.4  . Smokeless tobacco: Never Used  . Tobacco comment: 29 yrs ago  Vaping Use  . Vaping  Use: Never used  Substance Use Topics  . Alcohol use: No  . Drug use: No    Home Medications Prior to Admission medications   Medication Sig Start Date End Date Taking? Authorizing Provider  amiodarone (PACERONE) 200 MG tablet TAKE 1 TABLET BY MOUTH EVERY DAY 12/05/19  Yes Herminio Commons, MD  apixaban (ELIQUIS) 5 MG TABS tablet Take 1 tablet (5 mg total) by mouth 2 (two) times daily. 10/03/19  Yes Herminio Commons, MD  atorvastatin (LIPITOR) 20 MG tablet Take 1 tablet (20 mg total) by mouth daily. 10/03/19 09/27/20 Yes Herminio Commons, MD  diltiazem (CARDIZEM CD) 360 MG 24 hr capsule TAKE 1 CAPSULE BY MOUTH EVERY DAY 10/11/19  Yes Herminio Commons, MD  glipiZIDE (GLUCOTROL) 5 MG tablet Take 2 tablets (10 mg total) by mouth 2 (two) times daily. 08/30/19  Yes [name], MD  metFORMIN (GLUCOPHAGE) 500 MG tablet Take 2 tablets (1,000 mg total) by mouth 2 (two) times daily. 08/30/19  Yes [name], MD  metoprolol tartrate (LOPRESSOR) 25 MG tablet TAKE 1 TABLET BY MOUTH TWICE A DAY 10/12/19  Yes Herminio Commons, MD  torsemide (DEMADEX) 20 MG tablet Take 1 tablet (20 mg total) by mouth 2 (two) times daily. 12/01/19 11/25/20 Yes Herminio Commons, MD  methocarbamol (ROBAXIN) 500 MG tablet Take 1 tablet (500 mg total) by mouth 2 (two) times daily as needed for muscle spasms. 03/23/20   Noemi Chapel, MD  naproxen (NAPROSYN) 500 MG tablet Take 1 tablet (500 mg total) by mouth 2 (two) times daily with a meal. 03/23/20   Noemi Chapel, MD    Allergies    Demerol, Vasotec [enalapril], and Lasix [furosemide]  Review of Systems   Review of Systems  All other systems reviewed and are negative.   Physical Exam Updated Vital Signs BP 132/64 (BP Location: Left Arm)   Pulse 68   Temp 98.7 F (37.1 C) (Oral)   Resp 16   Ht 1.778 m (5\' 10" )   Wt 95.3 kg   SpO2 98%   BMI 30.13 kg/m   Physical Exam Vitals and nursing note reviewed.  Constitutional:      General: He is not  in acute distress.    Appearance: He is well-developed.  HENT:     Head: Normocephalic and atraumatic.     Mouth/Throat:     Pharynx: No oropharyngeal exudate.  Eyes:     General: No scleral icterus.       Right eye: No discharge.        Left eye: No discharge.  Conjunctiva/sclera: Conjunctivae normal.     Pupils: Pupils are equal, round, and reactive to light.  Neck:     Thyroid: No thyromegaly.     Vascular: No JVD.  Cardiovascular:     Rate and Rhythm: Normal rate and regular rhythm.     Heart sounds: Normal heart sounds. No murmur heard.  No friction rub. No gallop.   Pulmonary:     Effort: Pulmonary effort is normal. No respiratory distress.     Breath sounds: Normal breath sounds. No wheezing or rales.  Abdominal:     General: Bowel sounds are normal. There is no distension.     Palpations: Abdomen is soft. There is no mass.     Tenderness: There is no abdominal tenderness.  Musculoskeletal:        General: Swelling and tenderness present. No deformity. Normal range of motion.     Cervical back: Normal range of motion and neck supple.     Right lower leg: No edema.     Left lower leg: No edema.     Comments: The patient has no edema of the lower extremities, there is no leg length discrepancy.  I was able to visualize the patient try to get from the wheelchair into his bed, he insisted on doing this by himself and did so successfully though very slowly.  He is able to straight leg raise both of his legs but has some pain with doing it on the left.  He states that when he tries to move the hip around it is very stiff but as it moves more it gets a little better.  He has tenderness to palpation over the left lateral hip, normal pulses at the feet bilaterally, normal strength at the knees and ankles bilaterally.  The right ankle is warm erythematous and tender with range of motion  Lymphadenopathy:     Cervical: No cervical adenopathy.  Skin:    General: Skin is warm and dry.      Findings: No erythema or rash.  Neurological:     Mental Status: He is alert.     Coordination: Coordination normal.     Comments: Normal strength and sensation, no facial droop  Psychiatric:        Behavior: Behavior normal.     ED Results / Procedures / Treatments   Labs (all labs ordered are listed, but only abnormal results are displayed) Labs Reviewed  CBG MONITORING, ED - Abnormal; Notable for the following components:      Result Value   Glucose-Capillary 228 (*)    All other components within normal limits    EKG None  Radiology DG Hip Unilat W or Wo Pelvis 2-3 Views Left  Result Date: 03/23/2020 CLINICAL DATA:  Status post fall. EXAM: DG HIP (WITH OR WITHOUT PELVIS) 2-3V LEFT COMPARISON:  None. FINDINGS: There is no evidence of acute hip fracture or dislocation. Mild to moderate severity degenerative changes seen involving the bilateral hips. Soft tissue structures are unremarkable. IMPRESSION: No acute osseous abnormality. Electronically Signed   By: Virgina Norfolk M.D.   On: 03/23/2020 16:20    Procedures Procedures (including critical care time)  Medications Ordered in ED Medications  oxyCODONE-acetaminophen (PERCOCET/ROXICET) 5-325 MG per tablet 2 tablet (2 tablets Oral Given 03/23/20 1559)    ED Course  I have reviewed the triage vital signs and the nursing notes.  Pertinent labs & imaging results that were available during my care of the patient were reviewed by me and  considered in my medical decision making (see chart for details).    MDM Rules/Calculators/A&P                          This patient is not in distress but does exhibit signs of possible injury to his either hip or pelvis.  Will obtain plain film imaging as well as pain control.  His vital signs are unremarkable and normal with no tachycardia or fever.  Though he did strike his head during the fall he has no symptoms, there is no signs of head trauma I do not think he needs a CT scan  at this time.  The patient's x-rays show some arthritis but no signs of obvious fractures of the hip or the pelvis.  At this time the patient is stable for discharge, he will be given a short course anti-inflammatory understanding that he is on Eliquis as well as a short course of Robaxin and follow-up with orthopedics.  He states that he has seen local orthopedics in the past, Dr. Aline Brochure and Dr. Luna Glasgow.  He was given their phone number and is agreeable.  He may need some physical therapy and I suspect that he has a contusion of the hip or possibly some muscle strains around the hip given his pain with range of motion  Final Clinical Impression(s) / ED Diagnoses Final diagnoses:  Contusion of left hip, initial encounter    Rx / DC Orders ED Discharge Orders         Ordered    naproxen (NAPROSYN) 500 MG tablet  2 times daily with meals        03/23/20 1645    methocarbamol (ROBAXIN) 500 MG tablet  2 times daily PRN        03/23/20 1645           Noemi Chapel, MD 03/23/20 1648

## 2020-03-28 ENCOUNTER — Other Ambulatory Visit: Payer: Self-pay | Admitting: Radiology

## 2020-03-28 ENCOUNTER — Other Ambulatory Visit: Payer: Self-pay | Admitting: Student

## 2020-03-29 ENCOUNTER — Other Ambulatory Visit: Payer: Self-pay

## 2020-03-29 ENCOUNTER — Encounter (HOSPITAL_COMMUNITY): Payer: Self-pay

## 2020-03-29 ENCOUNTER — Ambulatory Visit (HOSPITAL_COMMUNITY)
Admission: RE | Admit: 2020-03-29 | Discharge: 2020-03-29 | Disposition: A | Discharge status: Home / Self Care | Payer: Medicare HMO | Source: Ambulatory Visit | Attending: Hematology | Admitting: Hematology

## 2020-03-29 DIAGNOSIS — Z87891 Personal history of nicotine dependence: Secondary | ICD-10-CM | POA: Diagnosis not present

## 2020-03-29 DIAGNOSIS — N189 Chronic kidney disease, unspecified: Secondary | ICD-10-CM | POA: Diagnosis not present

## 2020-03-29 DIAGNOSIS — E1122 Type 2 diabetes mellitus with diabetic chronic kidney disease: Secondary | ICD-10-CM | POA: Insufficient documentation

## 2020-03-29 DIAGNOSIS — D531 Other megaloblastic anemias, not elsewhere classified: Secondary | ICD-10-CM | POA: Diagnosis not present

## 2020-03-29 DIAGNOSIS — I2510 Atherosclerotic heart disease of native coronary artery without angina pectoris: Secondary | ICD-10-CM | POA: Diagnosis not present

## 2020-03-29 DIAGNOSIS — D469 Myelodysplastic syndrome, unspecified: Secondary | ICD-10-CM | POA: Diagnosis not present

## 2020-03-29 DIAGNOSIS — K760 Fatty (change of) liver, not elsewhere classified: Secondary | ICD-10-CM | POA: Insufficient documentation

## 2020-03-29 DIAGNOSIS — D539 Nutritional anemia, unspecified: Secondary | ICD-10-CM | POA: Diagnosis not present

## 2020-03-29 DIAGNOSIS — R600 Localized edema: Secondary | ICD-10-CM | POA: Diagnosis not present

## 2020-03-29 DIAGNOSIS — Z79899 Other long term (current) drug therapy: Secondary | ICD-10-CM | POA: Diagnosis not present

## 2020-03-29 DIAGNOSIS — Z7901 Long term (current) use of anticoagulants: Secondary | ICD-10-CM | POA: Diagnosis not present

## 2020-03-29 DIAGNOSIS — E785 Hyperlipidemia, unspecified: Secondary | ICD-10-CM | POA: Insufficient documentation

## 2020-03-29 DIAGNOSIS — I129 Hypertensive chronic kidney disease with stage 1 through stage 4 chronic kidney disease, or unspecified chronic kidney disease: Secondary | ICD-10-CM | POA: Insufficient documentation

## 2020-03-29 DIAGNOSIS — Z7984 Long term (current) use of oral hypoglycemic drugs: Secondary | ICD-10-CM | POA: Insufficient documentation

## 2020-03-29 DIAGNOSIS — M898X8 Other specified disorders of bone, other site: Secondary | ICD-10-CM | POA: Diagnosis not present

## 2020-03-29 LAB — CBC WITH DIFFERENTIAL/PLATELET
Abs Immature Granulocytes: 0.92 10*3/uL — ABNORMAL HIGH (ref 0.00–0.07)
Basophils Absolute: 0.1 10*3/uL (ref 0.0–0.1)
Basophils Relative: 1 %
Eosinophils Absolute: 0.1 10*3/uL (ref 0.0–0.5)
Eosinophils Relative: 1 %
HCT: 29.1 % — ABNORMAL LOW (ref 39.0–52.0)
Hemoglobin: 9.5 g/dL — ABNORMAL LOW (ref 13.0–17.0)
Immature Granulocytes: 10 %
Lymphocytes Relative: 18 %
Lymphs Abs: 1.7 10*3/uL (ref 0.7–4.0)
MCH: 38.5 pg — ABNORMAL HIGH (ref 26.0–34.0)
MCHC: 32.6 g/dL (ref 30.0–36.0)
MCV: 117.8 fL — ABNORMAL HIGH (ref 80.0–100.0)
Monocytes Absolute: 1.1 10*3/uL — ABNORMAL HIGH (ref 0.1–1.0)
Monocytes Relative: 12 %
Neutro Abs: 5.4 10*3/uL (ref 1.7–7.7)
Neutrophils Relative %: 58 %
Platelets: 173 10*3/uL (ref 150–400)
RBC: 2.47 MIL/uL — ABNORMAL LOW (ref 4.22–5.81)
RDW: 14.3 % (ref 11.5–15.5)
WBC: 9.3 10*3/uL (ref 4.0–10.5)
nRBC: 0 % (ref 0.0–0.2)

## 2020-03-29 LAB — GLUCOSE, CAPILLARY: Glucose-Capillary: 168 mg/dL — ABNORMAL HIGH (ref 70–99)

## 2020-03-29 LAB — BASIC METABOLIC PANEL
Anion gap: 13 (ref 5–15)
BUN: 34 mg/dL — ABNORMAL HIGH (ref 8–23)
CO2: 23 mmol/L (ref 22–32)
Calcium: 9 mg/dL (ref 8.9–10.3)
Chloride: 104 mmol/L (ref 98–111)
Creatinine, Ser: 2.18 mg/dL — ABNORMAL HIGH (ref 0.61–1.24)
GFR calc Af Amer: 34 mL/min — ABNORMAL LOW (ref >60)
GFR calc non Af Amer: 29 mL/min — ABNORMAL LOW (ref >60)
Glucose, Bld: 172 mg/dL — ABNORMAL HIGH (ref 70–99)
Potassium: 4.1 mmol/L (ref 3.5–5.1)
Sodium: 140 mmol/L (ref 135–145)

## 2020-03-29 LAB — PROTIME-INR
INR: 1.8 — ABNORMAL HIGH (ref 0.8–1.2)
Prothrombin Time: 20.6 seconds — ABNORMAL HIGH (ref 11.4–15.2)

## 2020-03-29 MED ORDER — MIDAZOLAM HCL 2 MG/2ML IJ SOLN
INTRAMUSCULAR | Status: AC | PRN
  Administered 2020-03-29: 0.5 mg via INTRAVENOUS
  Administered 2020-03-29: 1 mg via INTRAVENOUS
  Administered 2020-03-29: 0.5 mg via INTRAVENOUS

## 2020-03-29 MED ORDER — MIDAZOLAM HCL 2 MG/2ML IJ SOLN
INTRAMUSCULAR | Status: AC
  Filled 2020-03-29: qty 4

## 2020-03-29 MED ORDER — FENTANYL CITRATE (PF) 100 MCG/2ML IJ SOLN
INTRAMUSCULAR | Status: AC | PRN
  Administered 2020-03-29 (×2): 50 ug via INTRAVENOUS

## 2020-03-29 MED ORDER — SODIUM CHLORIDE 0.9 % IV SOLN: INTRAVENOUS | Status: DC

## 2020-03-29 MED ORDER — FENTANYL CITRATE (PF) 100 MCG/2ML IJ SOLN
INTRAMUSCULAR | Status: AC
  Filled 2020-03-29: qty 2

## 2020-03-29 MED ORDER — HYDROCODONE-ACETAMINOPHEN 5-325 MG PO TABS: 1.0000 | ORAL_TABLET | ORAL | Status: DC | PRN

## 2020-03-29 MED ORDER — LIDOCAINE HCL (PF) 1 % IJ SOLN
INTRAMUSCULAR | Status: AC | PRN
  Administered 2020-03-29: 10 mL

## 2020-03-29 NOTE — Procedures (Signed)
Interventional Radiology Procedure Note  Procedure: CT BM  Complications: None  Estimated Blood Loss:  min  Findings: 11 g core and asp    M. Daryll Brod, MD

## 2020-03-29 NOTE — Progress Notes (Signed)
Patient discharged with no issues noted.  Bone Marrow biopsy site dressing clean dry and intact.  Patient and Lelon Frohlich verbalized understanding discharge instructions

## 2020-03-29 NOTE — Discharge Instructions (Signed)
Please call Interventional Radiology clinic [phone] with any questions or concerns.  You may remove your dressing and shower tomorrow.   Bone Marrow Aspiration and Bone Marrow Biopsy, Adult, Care After This sheet gives you information about how to care for yourself after your procedure. Your health care provider may also give you more specific instructions. If you have problems or questions, contact your health care provider. What can I expect after the procedure? After the procedure, it is common to have:  Mild pain and tenderness.  Swelling.  Bruising. Follow these instructions at home: Puncture site care   Follow instructions from your health care provider about how to take care of the puncture site. Make sure you: ? Wash your hands with soap and water before and after you change your bandage (dressing). If soap and water are not available, use hand sanitizer. ? Change your dressing as told by your health care provider.  Check your puncture site every day for signs of infection. Check for: ? More redness, swelling, or pain. ? Fluid or blood. ? Warmth. ? Pus or a bad smell. Activity  Return to your normal activities as told by your health care provider. Ask your health care provider what activities are safe for you.  Do not lift anything that is heavier than 10 lb (4.5 kg), or the limit that you are told, until your health care provider says that it is safe.  Do not drive for 24 hours if you were given a sedative during your procedure. General instructions   Take over-the-counter and prescription medicines only as told by your health care provider.  Do not take baths, swim, or use a hot tub until your health care provider approves. Ask your health care provider if you may take showers. You may only be allowed to take sponge baths.  If directed, put ice on the affected area. To do this: ? Put ice in a plastic bag. ? Place a towel between your skin and the  bag. ? Leave the ice on for 20 minutes, 2-3 times a day.  Keep all follow-up visits as told by your health care provider. This is important. Contact a health care provider if:  Your pain is not controlled with medicine.  You have a fever.  You have more redness, swelling, or pain around the puncture site.  You have fluid or blood coming from the puncture site.  Your puncture site feels warm to the touch.  You have pus or a bad smell coming from the puncture site. Summary  After the procedure, it is common to have mild pain, tenderness, swelling, and bruising.  Follow instructions from your health care provider about how to take care of the puncture site and what activities are safe for you.  Take over-the-counter and prescription medicines only as told by your health care provider.  Contact a health care provider if you have any signs of infection, such as fluid or blood coming from the puncture site. This information is not intended to replace advice given to you by your health care provider. Make sure you discuss any questions you have with your health care provider. Document Revised: 11/29/2018 Document Reviewed: 11/29/2018 Elsevier Patient Education  2020 [employer]   Moderate Conscious Sedation, Adult, Care After These instructions provide you with information about caring for yourself after your procedure. Your health care provider may also give you more specific instructions. Your treatment has been planned according to current medical practices, but problems sometimes occur. Call   your health care provider if you have any problems or questions after your procedure. What can I expect after the procedure? After your procedure, it is common:  To feel sleepy for several hours.  To feel clumsy and have poor balance for several hours.  To have poor judgment for several hours.  To vomit if you eat too soon. Follow these instructions at home: For at least 24 hours after  the procedure:   Do not: ? Participate in activities where you could fall or become injured. ? Drive. ? Use heavy machinery. ? Drink alcohol. ? Take sleeping pills or medicines that cause drowsiness. ? Make important decisions or sign legal documents. ? Take care of children on your own.  Rest. Eating and drinking  Follow the diet recommended by your health care provider.  If you vomit: ? Drink water, juice, or soup when you can drink without vomiting. ? Make sure you have little or no nausea before eating solid foods. General instructions  Have a responsible adult stay with you until you are awake and alert.  Take over-the-counter and prescription medicines only as told by your health care provider.  If you smoke, do not smoke without supervision.  Keep all follow-up visits as told by your health care provider. This is important. Contact a health care provider if:  You keep feeling nauseous or you keep vomiting.  You feel light-headed.  You develop a rash.  You have a fever. Get help right away if:  You have trouble breathing. This information is not intended to replace advice given to you by your health care provider. Make sure you discuss any questions you have with your health care provider. Document Revised: 06/25/2017 Document Reviewed: 11/02/2015 Elsevier Patient Education  2020 [employer] Inc[employer]  

## 2020-03-29 NOTE — Progress Notes (Signed)
Patient returned from IR after bone marrow biopsy with low back dressing intact with old and new drainage.  Dressing marked around drainage on dressing.  Reassessed patient in 5 minutes with continued new drainage, dressing changed and direct pressure applied for 15 minutes.  Dr. Annamaria Boots notified and examined patient.  Patient to remain until 11:30 for further observation.  Dressing continues clean dry and intact with no further drainage.

## 2020-03-29 NOTE — Consult Note (Signed)
Chief Complaint: Patient was seen in consultation today for CT-guided bone marrow biopsy  Referring Physician(s): [place]  Supervising Physician: Daryll Brod  Patient Status: [facility] - Out-pt  History of Present Illness: Jack Mejia is a 74 y.o. male with history of symptomatic anemia, chronic kidney disease atrial fibrillation, coronary artery disease, depression, diabetes, cholelithiasis, hypertension, and hyperlipidemia and lower extremity edema.  He presents today for CT-guided bone marrow biopsy to assess for possible MDS.  Past Medical History:  Diagnosis Date  . Arthritis   . Atrial fibrillation ([place])    a. s/p DCCV in 03/2019  . CAD (coronary artery disease) 06/29/2019  . Cramps of left lower extremity   . Depression   . Diabetes mellitus    type 2 for 7-8 yrs  . Dyspnea   . Elevated liver enzymes   . Fatty liver   . History of kidney stones   . Hyperlipidemia   . Hypertension   . Vertigo     Past Surgical History:  Procedure Laterality Date  . CARDIOVERSION N/A 04/21/2019   Procedure: CARDIOVERSION;  Surgeon: Sanda Klein, MD;  [facility];  Service: Cardiovascular;  Laterality: N/A;  . CARPAL TUNNEL RELEASE     both wrist  . cataract surgery     bilateral  . CHOLECYSTECTOMY    . EYE SURGERY    . HEMORROIDECTOMY    . KNEE SURGERY Left   . LEFT HEART CATH AND CORONARY ANGIOGRAPHY N/A 05/23/2019   Procedure: LEFT HEART CATH AND CORONARY ANGIOGRAPHY;  Surgeon: Belva Crome, MD;  Location: [place] [facility];  Service: Cardiovascular;  Laterality: N/A;  . LUMBAR LAMINECTOMY/DECOMPRESSION MICRODISCECTOMY N/A 08/06/2016   Procedure: LUMBAR THREE- LUMBAR FIVE  DECOMPRESSIVE LUMBAR LAMINECTOMY;  Surgeon: Jovita Gamma, MD;  Location: [place];  Service: Neurosurgery;  Laterality: N/A;    Allergies: Demerol, Vasotec [enalapril], and Lasix [furosemide]  Medications: Prior to Admission medications   Medication Sig Start Date End  Date Taking? Authorizing Provider  amiodarone (PACERONE) 200 MG tablet TAKE 1 TABLET BY MOUTH EVERY DAY 12/05/19  Yes Herminio Commons, MD  apixaban (ELIQUIS) 5 MG TABS tablet Take 1 tablet (5 mg total) by mouth 2 (two) times daily. 10/03/19  Yes Herminio Commons, MD  atorvastatin (LIPITOR) 20 MG tablet Take 1 tablet (20 mg total) by mouth daily. 10/03/19 09/27/20 Yes Herminio Commons, MD  diltiazem (CARDIZEM CD) 360 MG 24 hr capsule TAKE 1 CAPSULE BY MOUTH EVERY DAY 10/11/19  Yes Herminio Commons, MD  glipiZIDE (GLUCOTROL) 5 MG tablet Take 2 tablets (10 mg total) by mouth 2 (two) times daily. 08/30/19  Yes [name], MD  metFORMIN (GLUCOPHAGE) 500 MG tablet Take 2 tablets (1,000 mg total) by mouth 2 (two) times daily. 08/30/19  Yes [name], MD  methocarbamol (ROBAXIN) 500 MG tablet Take 1 tablet (500 mg total) by mouth 2 (two) times daily as needed for muscle spasms. 03/23/20  Yes Noemi Chapel, MD  metoprolol tartrate (LOPRESSOR) 25 MG tablet TAKE 1 TABLET BY MOUTH TWICE A DAY 10/12/19  Yes Herminio Commons, MD  naproxen (NAPROSYN) 500 MG tablet Take 1 tablet (500 mg total) by mouth 2 (two) times daily with a meal. 03/23/20  Yes Noemi Chapel, MD  torsemide (DEMADEX) 20 MG tablet Take 1 tablet (20 mg total) by mouth 2 (two) times daily. 12/01/19 11/25/20 Yes Herminio Commons, MD     Family History  Problem Relation Age of Onset  .  Diabetes Mother   . Cancer Mother        unknown kind  . Diabetes Sister   . COPD Sister   . Liver disease Brother   . COPD Brother   . Diabetes Maternal Grandmother   . Diabetes Brother   . COPD Sister   . Healthy Daughter   . Healthy Daughter     Social History   Socioeconomic History  . Marital status: Divorced    Spouse name: Not on file  . Number of children: 2  . Years of education: Not on file  . Highest education level: Not on file  Occupational History  . Occupation: RETIRED  Tobacco Use  . Smoking status: Former  Smoker    Packs/day: 4.00    Years: 20.00    Pack years: 80.00    Types: Cigarettes    Quit date: 10/14/1982    Years since quitting: 37.4  . Smokeless tobacco: Never Used  . Tobacco comment: 29 yrs ago  Vaping Use  . Vaping Use: Never used  Substance and Sexual Activity  . Alcohol use: No  . Drug use: No  . Sexual activity: Not Currently    Birth control/protection: None  Other Topics Concern  . Not on file  Social History Narrative  . Not on file   Social Determinants of Health   Financial Resource Strain:   . Difficulty of Paying Living Expenses: Not on file  Food Insecurity:   . Worried About Charity fundraiser in the Last Year: Not on file  . Ran Out of Food in the Last Year: Not on file  Transportation Needs:   . Lack of Transportation (Medical): Not on file  . Lack of Transportation (Non-Medical): Not on file  Physical Activity:   . Days of Exercise per Week: Not on file  . Minutes of Exercise per Session: Not on file  Stress:   . Feeling of Stress : Not on file  Social Connections:   . Frequency of Communication with Friends and Family: Not on file  . Frequency of Social Gatherings with Friends and Family: Not on file  . Attends Religious Services: Not on file  . Active Member of Clubs or Organizations: Not on file  . Attends Archivist Meetings: Not on file  . Marital Status: Not on file      Review of Systems denies fever, headache, chest pain, cough, abdominal/back pain, nausea, vomiting or bleeding.  He does have some dyspnea with exertion left hip pain.  Vital Signs: BP (!) 153/73   Pulse 65   Temp 97.7 F (36.5 C) (Oral)   Resp 17   SpO2 100%   Physical Exam awake, alert.  Chest clear to auscultation bilaterally.  Heart with regular rate and rhythm.  Abdomen protuberant, soft, positive bowel sounds, nontender.  Bilateral lower extremity edema noted.  Imaging: DG Hip Unilat W or Wo Pelvis 2-3 Views Left  Result Date:  03/23/2020 CLINICAL DATA:  Status post fall. EXAM: DG HIP (WITH OR WITHOUT PELVIS) 2-3V LEFT COMPARISON:  None. FINDINGS: There is no evidence of acute hip fracture or dislocation. Mild to moderate severity degenerative changes seen involving the bilateral hips. Soft tissue structures are unremarkable. IMPRESSION: No acute osseous abnormality. Electronically Signed   By: Virgina Norfolk M.D.   On: 03/23/2020 16:20    Labs:  CBC: Recent Labs    11/01/19 1346 12/14/19 0849 03/11/20 1331 03/29/20 0720  WBC 5.3 5.3 4.7 9.3  HGB  9.7* 10.8* 9.7* 9.5*  HCT 30.8* 33.2* 29.3* 29.1*  PLT 129* 137* 124* 173    COAGS: Recent Labs    03/29/20 0720  INR 1.8*    BMP: Recent Labs    11/01/19 1346 12/12/19 1111 12/14/19 0849 03/29/20 0720  NA 138 137 142 140  K 4.6 4.1 4.6 4.1  CL 103 96 98 104  CO2 $Re'23 23 29 23  'trx$ GLUCOSE 171* 318* 214* 172*  BUN 24* 36* 41* 34*  CALCIUM 9.4 8.9 9.2 9.0  CREATININE 1.79* 2.47* 2.39* 2.18*  GFRNONAA 37* 25* 26* 29*  GFRAA 43* 29* 30* 34*    LIVER FUNCTION TESTS: Recent Labs    11/01/19 1346 12/14/19 0849  BILITOT 0.9 1.1  AST 20 28  ALT 19 29  ALKPHOS 79 90  PROT 7.3 7.8  ALBUMIN 3.7 4.2    TUMOR MARKERS: No results for input(s): AFPTM, CEA, CA199, CHROMGRNA in the last 8760 hours.  Assessment and Plan: 74 y.o. male with history of symptomatic anemia, chronic kidney disease atrial fibrillation, coronary artery disease, depression, diabetes, cholelithiasis, hypertension, and hyperlipidemia and lower extremity edema.  He presents today for CT-guided bone marrow biopsy to assess for possible MDS.Risks and benefits of procedure was discussed with the patient  including, but not limited to bleeding, infection, damage to adjacent structures or low yield requiring additional tests.  All of the questions were answered and there is agreement to proceed.  Consent signed and in chart.     Thank you for this interesting consult.  I greatly  enjoyed meeting Jack Mejia and look forward to participating in their care.  A copy of this report was sent to the requesting provider on this date.  Electronically Signed: D. Rowe Robert, PA-C 03/29/2020, 8:30 AM   I spent a total of 20 minutes    in face to face in clinical consultation, greater than 50% of which was counseling/coordinating care for CT-guided bone marrow biopsy

## 2020-04-03 LAB — SURGICAL PATHOLOGY

## 2020-04-04 ENCOUNTER — Other Ambulatory Visit: Payer: Self-pay

## 2020-04-04 ENCOUNTER — Inpatient Hospital Stay (HOSPITAL_COMMUNITY): Payer: Medicare HMO | Admitting: Hematology

## 2020-04-04 ENCOUNTER — Inpatient Hospital Stay (HOSPITAL_COMMUNITY): Payer: Medicare HMO | Attending: Nurse Practitioner

## 2020-04-04 VITALS — BP 147/56 | HR 65 | Temp 98.0°F | Resp 16 | Wt 208.8 lb

## 2020-04-04 DIAGNOSIS — D631 Anemia in chronic kidney disease: Secondary | ICD-10-CM | POA: Insufficient documentation

## 2020-04-04 DIAGNOSIS — I10 Essential (primary) hypertension: Secondary | ICD-10-CM | POA: Insufficient documentation

## 2020-04-04 DIAGNOSIS — R5383 Other fatigue: Secondary | ICD-10-CM | POA: Insufficient documentation

## 2020-04-04 DIAGNOSIS — I132 Hypertensive heart and chronic kidney disease with heart failure and with stage 5 chronic kidney disease, or end stage renal disease: Secondary | ICD-10-CM | POA: Insufficient documentation

## 2020-04-04 DIAGNOSIS — R0602 Shortness of breath: Secondary | ICD-10-CM | POA: Insufficient documentation

## 2020-04-04 DIAGNOSIS — I2510 Atherosclerotic heart disease of native coronary artery without angina pectoris: Secondary | ICD-10-CM | POA: Diagnosis not present

## 2020-04-04 DIAGNOSIS — F329 Major depressive disorder, single episode, unspecified: Secondary | ICD-10-CM | POA: Insufficient documentation

## 2020-04-04 DIAGNOSIS — E785 Hyperlipidemia, unspecified: Secondary | ICD-10-CM | POA: Diagnosis not present

## 2020-04-04 DIAGNOSIS — I4891 Unspecified atrial fibrillation: Secondary | ICD-10-CM | POA: Diagnosis not present

## 2020-04-04 DIAGNOSIS — Z87891 Personal history of nicotine dependence: Secondary | ICD-10-CM | POA: Diagnosis not present

## 2020-04-04 DIAGNOSIS — R05 Cough: Secondary | ICD-10-CM | POA: Diagnosis not present

## 2020-04-04 DIAGNOSIS — E119 Type 2 diabetes mellitus without complications: Secondary | ICD-10-CM | POA: Insufficient documentation

## 2020-04-04 DIAGNOSIS — D539 Nutritional anemia, unspecified: Secondary | ICD-10-CM

## 2020-04-04 DIAGNOSIS — N185 Chronic kidney disease, stage 5: Secondary | ICD-10-CM | POA: Diagnosis not present

## 2020-04-04 DIAGNOSIS — Z79899 Other long term (current) drug therapy: Secondary | ICD-10-CM | POA: Insufficient documentation

## 2020-04-04 DIAGNOSIS — K760 Fatty (change of) liver, not elsewhere classified: Secondary | ICD-10-CM | POA: Diagnosis not present

## 2020-04-04 DIAGNOSIS — M129 Arthropathy, unspecified: Secondary | ICD-10-CM | POA: Diagnosis not present

## 2020-04-04 DIAGNOSIS — Z87442 Personal history of urinary calculi: Secondary | ICD-10-CM | POA: Diagnosis not present

## 2020-04-04 LAB — CBC WITH DIFFERENTIAL/PLATELET
Abs Immature Granulocytes: 0.43 10*3/uL — ABNORMAL HIGH (ref 0.00–0.07)
Basophils Absolute: 0 10*3/uL (ref 0.0–0.1)
Basophils Relative: 1 %
Eosinophils Absolute: 0.1 10*3/uL (ref 0.0–0.5)
Eosinophils Relative: 1 %
HCT: 28.8 % — ABNORMAL LOW (ref 39.0–52.0)
Hemoglobin: 9.3 g/dL — ABNORMAL LOW (ref 13.0–17.0)
Immature Granulocytes: 7 %
Lymphocytes Relative: 22 %
Lymphs Abs: 1.3 10*3/uL (ref 0.7–4.0)
MCH: 38.9 pg — ABNORMAL HIGH (ref 26.0–34.0)
MCHC: 32.3 g/dL (ref 30.0–36.0)
MCV: 120.5 fL — ABNORMAL HIGH (ref 80.0–100.0)
Monocytes Absolute: 1.3 10*3/uL — ABNORMAL HIGH (ref 0.1–1.0)
Monocytes Relative: 23 %
Neutro Abs: 2.7 10*3/uL (ref 1.7–7.7)
Neutrophils Relative %: 46 %
Platelets: 144 10*3/uL — ABNORMAL LOW (ref 150–400)
RBC: 2.39 MIL/uL — ABNORMAL LOW (ref 4.22–5.81)
RDW: 15.1 % (ref 11.5–15.5)
WBC Morphology: INCREASED
WBC: 5.9 10*3/uL (ref 4.0–10.5)
nRBC: 0 % (ref 0.0–0.2)

## 2020-04-04 LAB — COMPREHENSIVE METABOLIC PANEL
ALT: 33 U/L (ref 0–44)
AST: 28 U/L (ref 15–41)
Albumin: 3.8 g/dL (ref 3.5–5.0)
Alkaline Phosphatase: 76 U/L (ref 38–126)
Anion gap: 15 (ref 5–15)
BUN: 39 mg/dL — ABNORMAL HIGH (ref 8–23)
CO2: 24 mmol/L (ref 22–32)
Calcium: 9.5 mg/dL (ref 8.9–10.3)
Chloride: 100 mmol/L (ref 98–111)
Creatinine, Ser: 2.59 mg/dL — ABNORMAL HIGH (ref 0.61–1.24)
GFR calc Af Amer: 27 mL/min — ABNORMAL LOW (ref >60)
GFR calc non Af Amer: 24 mL/min — ABNORMAL LOW (ref >60)
Glucose, Bld: 264 mg/dL — ABNORMAL HIGH (ref 70–99)
Potassium: 4.4 mmol/L (ref 3.5–5.1)
Sodium: 139 mmol/L (ref 135–145)
Total Bilirubin: 1.2 mg/dL (ref 0.3–1.2)
Total Protein: 7.2 g/dL (ref 6.5–8.1)

## 2020-04-04 LAB — IRON AND TIBC
Iron: 123 ug/dL (ref 45–182)
Saturation Ratios: 36 % (ref 17.9–39.5)
TIBC: 346 ug/dL (ref 250–450)
UIBC: 223 ug/dL

## 2020-04-04 LAB — FERRITIN: Ferritin: 524 ng/mL — ABNORMAL HIGH (ref 24–336)

## 2020-04-04 LAB — VITAMIN D 25 HYDROXY (VIT D DEFICIENCY, FRACTURES): Vit D, 25-Hydroxy: 24.68 ng/mL — ABNORMAL LOW (ref 30–100)

## 2020-04-04 LAB — LACTATE DEHYDROGENASE: LDH: 242 U/L — ABNORMAL HIGH (ref 98–192)

## 2020-04-04 LAB — VITAMIN B12: Vitamin B-12: 431 pg/mL (ref 180–914)

## 2020-04-04 NOTE — Progress Notes (Signed)
Jack Mejia, [place] [address]   CLINIC:  Medical Oncology/Hematology  PCP:  Erven Colla, DO [address] / [place] [address]  [phone]  REASON FOR VISIT:  Follow-up for macrocytic anemia  PRIOR THERAPY: None  CURRENT THERAPY: Venofer  INTERVAL HISTORY:  Jack Mejia, a 74 y.o. male, returns for routine follow-up for his macrocytic anemia. Jack Mejia was last seen on 12/29/2019.  Today he is accompanied by his wife. He complains of fatigue and SOB when he walks. His dyspnea has not improved over the past 1 year even after all of the specialists he has seen. His cough is stable and unproductive. He received 5 infusions of Venofer near the end of June. He also complains of rectal bleeding which is presumably due to hemorrhoids. He is still taking Eliquis.   REVIEW OF SYSTEMS:  Review of Systems  Constitutional: Positive for fatigue (severe). Negative for appetite change.  Respiratory: Positive for cough and shortness of breath (w/ exertion).   Gastrointestinal: Positive for blood in stool.  Musculoskeletal: Positive for arthralgias (R hip and L flank pain).  Neurological: Positive for dizziness.  Psychiatric/Behavioral: Positive for depression and sleep disturbance. The patient is nervous/anxious.   All other systems reviewed and are negative.   PAST MEDICAL/SURGICAL HISTORY:  Past Medical History:  Diagnosis Date  . Arthritis   . Atrial fibrillation ([place])    a. s/p DCCV in 03/2019  . CAD (coronary artery disease) 06/29/2019  . Cramps of left lower extremity   . Depression   . Diabetes mellitus    type 2 for 7-8 yrs  . Dyspnea   . Elevated liver enzymes   . Fatty liver   . History of kidney stones   . Hyperlipidemia   . Hypertension   . Vertigo    Past Surgical History:  Procedure Laterality Date  . CARDIOVERSION N/A 04/21/2019   Procedure: CARDIOVERSION;  Surgeon: Sanda Klein, MD;  [facility];   Service: Cardiovascular;  Laterality: N/A;  . CARPAL TUNNEL RELEASE     both wrist  . cataract surgery     bilateral  . CHOLECYSTECTOMY    . EYE SURGERY    . HEMORROIDECTOMY    . KNEE SURGERY Left   . LEFT HEART CATH AND CORONARY ANGIOGRAPHY N/A 05/23/2019   Procedure: LEFT HEART CATH AND CORONARY ANGIOGRAPHY;  Surgeon: Belva Crome, MD;  Location: [place] [facility];  Service: Cardiovascular;  Laterality: N/A;  . LUMBAR LAMINECTOMY/DECOMPRESSION MICRODISCECTOMY N/A 08/06/2016   Procedure: LUMBAR THREE- LUMBAR FIVE  DECOMPRESSIVE LUMBAR LAMINECTOMY;  Surgeon: Jovita Gamma, MD;  Location: [facility];  Service: Neurosurgery;  Laterality: N/A;    SOCIAL HISTORY:  Social History   Socioeconomic History  . Marital status: Divorced    Spouse name: Not on file  . Number of children: 2  . Years of education: Not on file  . Highest education level: Not on file  Occupational History  . Occupation: RETIRED  Tobacco Use  . Smoking status: Former Smoker    Packs/day: 4.00    Years: 20.00    Pack years: 80.00    Types: Cigarettes    Quit date: 10/14/1982    Years since quitting: 37.4  . Smokeless tobacco: Never Used  . Tobacco comment: 29 yrs ago  Vaping Use  . Vaping Use: Never used  Substance and Sexual Activity  . Alcohol use: No  . Drug use: No  . Sexual activity:  Not Currently    Birth control/protection: None  Other Topics Concern  . Not on file  Social History Narrative  . Not on file   Social Determinants of Health   Financial Resource Strain:   . Difficulty of Paying Living Expenses: Not on file  Food Insecurity:   . Worried About Charity fundraiser in the Last Year: Not on file  . Ran Out of Food in the Last Year: Not on file  Transportation Needs:   . Lack of Transportation (Medical): Not on file  . Lack of Transportation (Non-Medical): Not on file  Physical Activity:   . Days of Exercise per Week: Not on file  . Minutes of Exercise per Session: Not on file    Stress:   . Feeling of Stress : Not on file  Social Connections:   . Frequency of Communication with Friends and Family: Not on file  . Frequency of Social Gatherings with Friends and Family: Not on file  . Attends Religious Services: Not on file  . Active Member of Clubs or Organizations: Not on file  . Attends Archivist Meetings: Not on file  . Marital Status: Not on file  Intimate Partner Violence:   . Fear of Current or Ex-Partner: Not on file  . Emotionally Abused: Not on file  . Physically Abused: Not on file  . Sexually Abused: Not on file    FAMILY HISTORY:  Family History  Problem Relation Age of Onset  . Diabetes Mother   . Cancer Mother        unknown kind  . Diabetes Sister   . COPD Sister   . Liver disease Brother   . COPD Brother   . Diabetes Maternal Grandmother   . Diabetes Brother   . COPD Sister   . Healthy Daughter   . Healthy Daughter     CURRENT MEDICATIONS:  Current Outpatient Medications  Medication Sig Dispense Refill  . amiodarone (PACERONE) 200 MG tablet TAKE 1 TABLET BY MOUTH EVERY DAY 90 tablet 2  . apixaban (ELIQUIS) 5 MG TABS tablet Take 1 tablet (5 mg total) by mouth 2 (two) times daily. 60 tablet 6  . atorvastatin (LIPITOR) 20 MG tablet Take 1 tablet (20 mg total) by mouth daily. 90 tablet 3  . diltiazem (CARDIZEM CD) 360 MG 24 hr capsule TAKE 1 CAPSULE BY MOUTH EVERY DAY 90 capsule 1  . glipiZIDE (GLUCOTROL) 5 MG tablet Take 2 tablets (10 mg total) by mouth 2 (two) times daily. 360 tablet 1  . metFORMIN (GLUCOPHAGE) 500 MG tablet Take 2 tablets (1,000 mg total) by mouth 2 (two) times daily. 360 tablet 1  . methocarbamol (ROBAXIN) 500 MG tablet Take 1 tablet (500 mg total) by mouth 2 (two) times daily as needed for muscle spasms. 20 tablet 0  . metoprolol tartrate (LOPRESSOR) 25 MG tablet TAKE 1 TABLET BY MOUTH TWICE A DAY 180 tablet 1  . naproxen (NAPROSYN) 500 MG tablet Take 1 tablet (500 mg total) by mouth 2 (two) times daily  with a meal. 30 tablet 0  . torsemide (DEMADEX) 20 MG tablet Take 1 tablet (20 mg total) by mouth 2 (two) times daily. 90 tablet 3   No current facility-administered medications for this visit.    ALLERGIES:  Allergies  Allergen Reactions  . Demerol Nausea And Vomiting  . Vasotec [Enalapril] Cough  . Lasix [Furosemide] Rash    PHYSICAL EXAM:  Performance status (ECOG): 1 - Symptomatic but completely ambulatory  Vitals:   04/04/20 1126  BP: (!) 147/56  Pulse: 65  Resp: 16  Temp: 98 F (36.7 C)  SpO2: 99%   Wt Readings from Last 3 Encounters:  04/04/20 208 lb 12.4 oz (94.7 kg)  03/23/20 210 lb (95.3 kg)  03/13/20 207 lb 10.8 oz (94.2 kg)   Physical Exam Vitals reviewed.  Constitutional:      Appearance: Normal appearance.  Cardiovascular:     Rate and Rhythm: Normal rate and regular rhythm.     Pulses: Normal pulses.     Heart sounds: Murmur (systolic murmur over aortic area) heard.   Pulmonary:     Effort: Pulmonary effort is normal.     Breath sounds: Normal breath sounds.  Musculoskeletal:     Right lower leg: Edema (trace) present.     Left lower leg: Edema (trace) present.  Neurological:     General: No focal deficit present.     Mental Status: He is alert and oriented to person, place, and time.  Psychiatric:        Mood and Affect: Mood is depressed.        Behavior: Behavior normal.     LABORATORY DATA:  I have reviewed the labs as listed.  CBC Latest Ref Rng & Units 04/04/2020 03/29/2020 03/11/2020  WBC 4.0 - 10.5 K/uL 5.9 9.3 4.7  Hemoglobin 13.0 - 17.0 g/dL 9.3(L) 9.5(L) 9.7(L)  Hematocrit 39 - 52 % 28.8(L) 29.1(L) 29.3(L)  Platelets 150 - 400 K/uL 144(L) 173 124(L)   CMP Latest Ref Rng & Units 04/04/2020 03/29/2020 12/14/2019  Glucose 70 - 99 mg/dL 264(H) 172(H) 214(H)  BUN 8 - 23 mg/dL 39(H) 34(H) 41(H)  Creatinine 0.61 - 1.24 mg/dL 2.59(H) 2.18(H) 2.39(H)  Sodium 135 - 145 mmol/L 139 140 142  Potassium 3.5 - 5.1 mmol/L 4.4 4.1 4.6  Chloride 98 -  111 mmol/L 100 104 98  CO2 22 - 32 mmol/L _0 Calcium 8.9 - 10.3 mg/dL 9.5 9.0 9.2  Total Protein 6.5 - 8.1 g/dL 7.2 - 7.8  Total Bilirubin 0.3 - 1.2 mg/dL 1.2 - 1.1  Alkaline Phos 38 - 126 U/L 76 - 90  AST 15 - 41 U/L 28 - 28  ALT 0 - 44 U/L 33 - 29      Component Value Date/Time   RBC 2.39 (L) 04/04/2020 0850   MCV 120.5 (H) 04/04/2020 0850   MCV 101 (H) 09/07/2018 1638   MCH 38.9 (H) 04/04/2020 0850   MCHC 32.3 04/04/2020 0850   RDW 15.1 04/04/2020 0850   RDW 12.8 09/07/2018 1638   LYMPHSABS 1.3 04/04/2020 0850   LYMPHSABS 1.3 09/07/2018 1638   MONOABS 1.3 (H) 04/04/2020 0850   EOSABS 0.1 04/04/2020 0850   EOSABS 0.2 09/07/2018 1638   BASOSABS 0.0 04/04/2020 0850   BASOSABS 0.1 09/07/2018 1638   Lab Results  Component Value Date   LDH 242 (H) 04/04/2020   LDH 241 (H) 12/14/2019   Lab Results  Component Value Date   TIBC 346 04/04/2020   TIBC 317 03/11/2020   TIBC 325 12/14/2019   FERRITIN 524 (H) 04/04/2020   FERRITIN 496 (H) 03/11/2020   FERRITIN 154 12/14/2019   IRONPCTSAT 36 04/04/2020   IRONPCTSAT 40 (H) 03/11/2020   IRONPCTSAT 25 12/14/2019    DIAGNOSTIC IMAGING:  I have independently reviewed the scans and discussed with the patient. CT Biopsy  Result Date: 03/29/2020 INDICATION: MACROCYTIC ANEMIA, CONCERN FOR MILD DYSPLASTIC SYNDROME EXAM: CT GUIDED RIGHT ILIAC  BONE MARROW ASPIRATION AND CORE BIOPSY Date:  03/29/2020 03/29/2020 9:54 am Radiologist:  M. Daryll Brod, MD Guidance:  CT FLUOROSCOPY TIME:  Fluoroscopy Time: None. MEDICATIONS: 1% lidocaine local ANESTHESIA/SEDATION: 2.0 mg IV Versed; 100 mcg IV Fentanyl Moderate Sedation Time:  10 minutes The patient was continuously monitored during the procedure by the interventional radiology nurse under my direct supervision. CONTRAST:  None. COMPLICATIONS: None PROCEDURE: Informed consent was obtained from the patient following explanation of the procedure, risks, benefits and alternatives. The patient  understands, agrees and consents for the procedure. All questions were addressed. A time out was performed. The patient was positioned prone and non-contrast localization CT was performed of the pelvis to demonstrate the iliac marrow spaces. Maximal barrier sterile technique utilized including caps, mask, sterile gowns, sterile gloves, large sterile drape, hand hygiene, and Betadine prep. Under sterile conditions and local anesthesia, an 11 gauge coaxial bone biopsy needle was advanced into the right iliac marrow space. Needle position was confirmed with CT imaging. Initially, bone marrow aspiration was performed. Next, the 11 gauge outer cannula was utilized to obtain a right iliac bone marrow core biopsy. Needle was removed. Hemostasis was obtained with compression. The patient tolerated the procedure well. Samples were prepared with the cytotechnologist. No immediate complications. IMPRESSION: CT guided right iliac bone marrow aspiration and core biopsy. Electronically Signed   By: Jerilynn Mages.  Shick M.D.   On: 03/29/2020 10:32   CT BONE MARROW BIOPSY & ASPIRATION  Result Date: 03/29/2020 INDICATION: MACROCYTIC ANEMIA, CONCERN FOR MILD DYSPLASTIC SYNDROME EXAM: CT GUIDED RIGHT ILIAC BONE MARROW ASPIRATION AND CORE BIOPSY Date:  03/29/2020 03/29/2020 9:54 am Radiologist:  M. Daryll Brod, MD Guidance:  CT FLUOROSCOPY TIME:  Fluoroscopy Time: None. MEDICATIONS: 1% lidocaine local ANESTHESIA/SEDATION: 2.0 mg IV Versed; 100 mcg IV Fentanyl Moderate Sedation Time:  10 minutes The patient was continuously monitored during the procedure by the interventional radiology nurse under my direct supervision. CONTRAST:  None. COMPLICATIONS: None PROCEDURE: Informed consent was obtained from the patient following explanation of the procedure, risks, benefits and alternatives. The patient understands, agrees and consents for the procedure. All questions were addressed. A time out was performed. The patient was positioned prone and  non-contrast localization CT was performed of the pelvis to demonstrate the iliac marrow spaces. Maximal barrier sterile technique utilized including caps, mask, sterile gowns, sterile gloves, large sterile drape, hand hygiene, and Betadine prep. Under sterile conditions and local anesthesia, an 11 gauge coaxial bone biopsy needle was advanced into the right iliac marrow space. Needle position was confirmed with CT imaging. Initially, bone marrow aspiration was performed. Next, the 11 gauge outer cannula was utilized to obtain a right iliac bone marrow core biopsy. Needle was removed. Hemostasis was obtained with compression. The patient tolerated the procedure well. Samples were prepared with the cytotechnologist. No immediate complications. IMPRESSION: CT guided right iliac bone marrow aspiration and core biopsy. Electronically Signed   By: Jerilynn Mages.  Shick M.D.   On: 03/29/2020 10:32   DG Hip Unilat W or Wo Pelvis 2-3 Views Left  Result Date: 03/23/2020 CLINICAL DATA:  Status post fall. EXAM: DG HIP (WITH OR WITHOUT PELVIS) 2-3V LEFT COMPARISON:  None. FINDINGS: There is no evidence of acute hip fracture or dislocation. Mild to moderate severity degenerative changes seen involving the bilateral hips. Soft tissue structures are unremarkable. IMPRESSION: No acute osseous abnormality. Electronically Signed   By: Virgina Norfolk M.D.   On: 03/23/2020 16:20     ASSESSMENT:  1. Macrocytic anemia: -  Patient seen at the request of Dr. Wolfgang Phoenix for macrocytic anemia. -CTAP on 04/15/2017 showed no splenomegaly. -Patient has macrocytosis noted since February 2020, gradually worsening. -Colonoscopy on 06/05/2010 shows small polyp in the sigmoid colon colon, right colon, hepatic flexure and transverse colon. -He continues to have occasional bleeding on the tissue paper since Eliquis was started. -Serum erythropoietin was 77.2. -SPEP was negative.  Other nutritional deficiencies were ruled out. -Bone marrow biopsy on  03/29/2020 shows hypercellular marrow with trilineage dysplasia.  Blasts are 2%.  Chromosomes and FISH are pending. -5 doses of Feraheme completed on 01/24/2020.  2.  CKD: -History of CKD since 2017. -Renal ultrasound on 04/21/2019 shows bilateral renal cysts.   PLAN:  1.  Low risk MDS: -He finally underwent bone marrow biopsy on 03/29/2020. -We reviewed results of the pathology which showed dysplasia in all 3 cell lines. -Chromosome analysis and FISH panel are pending.  Revised IPSS score is 1 based on hemoglobin between 8 and 10.  He likely has low risk MDS if his chromosome analysis is normal. -Reviewed labs from today with hemoglobin 9.3.  Ferritin 524 and percent saturation of 36.  B12 was normal. -We talked about trying erythropoiesis stimulating agents at this time.  As he is serum EPO was 77, he will likely respond.  Hopefully we can improve his dyspnea on exertion by increasing his hemoglobin. -After long discussion, he agreed to give it a try.  We will start him on Retacrit 100 units/kg/3 times a week, rounded off to 30,000 units weekly. -We discussed the side effects in detail.  Will start as soon as his insurance authorizes. -I will reevaluate him in 4 weeks after the start. -We will follow up on the chromosome analysis and MDS FISH panel.  2. CKD: -His creatinine today is 2.59.  3. Atrial fibrillation: -Continue Eliquis 5 mg twice daily.  4.  Dyspnea on exertion: -He is very upset about his dyspnea on exertion.  He has been having this since last year and has not improved at all. -I have recommended follow-up with cardiology.  Orders placed this encounter:  No orders of the defined types were placed in this encounter.  Total time spent is 40 minutes with more than 50% of the time spent face-to-face discussing biopsy results, treatment plan, side effects and coordination of care.  Derek Jack, MD [place] [phone]   I, Milinda Antis, am  acting as a scribe for Dr. Sanda Linger.  I, Derek Jack MD, have reviewed the above documentation for accuracy and completeness, and I agree with the above.

## 2020-04-04 NOTE — Patient Instructions (Signed)
[place] at [facility] Discharge Instructions  You were seen today by Dr. Delton Coombes. He went over your recent results; your bone marrow biopsy shows a low-grade myelodysplastic syndrome (MDS). You will be started on Procrit injections to improve your blood levels. You will be referred to cardiology for your heart issues. Dr. Delton Coombes will see you back in 5 weeks for labs and follow up.   Thank you for choosing [place] at [facility] to provide your oncology and hematology care.  To afford each patient quality time with our provider, please arrive at least 15 minutes before your scheduled appointment time.   If you have a lab appointment with the [facility] please come in thru the Main Entrance and check in at the main information desk  You need to re-schedule your appointment should you arrive 10 or more minutes late.  We strive to give you quality time with our providers, and arriving late affects you and other patients whose appointments are after yours.  Also, if you no show three or more times for appointments you may be dismissed from the clinic at the providers discretion.     Again, thank you for choosing [facility].  Our hope is that these requests will decrease the amount of time that you wait before being seen by our physicians.       _____________________________________________________________  Should you have questions after your visit to [facility], please contact our office at [phone] between the hours of 8:00 a.m. and 4:30 p.m.  Voicemails left after 4:00 p.m. will not be returned until the following business day.  For prescription refill requests, have your pharmacy contact our office and allow 72 hours.    Cancer Center Support Programs:   > Cancer Support Group  2nd Tuesday of the month 1pm-2pm, Journey Room

## 2020-04-05 ENCOUNTER — Encounter (HOSPITAL_COMMUNITY): Payer: Self-pay

## 2020-04-08 ENCOUNTER — Encounter (HOSPITAL_COMMUNITY): Payer: Self-pay

## 2020-04-08 ENCOUNTER — Inpatient Hospital Stay (HOSPITAL_COMMUNITY): Payer: Medicare HMO | Attending: Hematology

## 2020-04-08 ENCOUNTER — Other Ambulatory Visit (HOSPITAL_COMMUNITY): Payer: Medicare HMO

## 2020-04-08 ENCOUNTER — Other Ambulatory Visit: Payer: Self-pay

## 2020-04-08 ENCOUNTER — Inpatient Hospital Stay (HOSPITAL_COMMUNITY): Payer: Medicare HMO

## 2020-04-08 VITALS — BP 138/63 | HR 69 | Temp 96.9°F | Resp 17

## 2020-04-08 DIAGNOSIS — Z79899 Other long term (current) drug therapy: Secondary | ICD-10-CM | POA: Insufficient documentation

## 2020-04-08 DIAGNOSIS — N185 Chronic kidney disease, stage 5: Secondary | ICD-10-CM | POA: Diagnosis not present

## 2020-04-08 DIAGNOSIS — D539 Nutritional anemia, unspecified: Secondary | ICD-10-CM

## 2020-04-08 DIAGNOSIS — D631 Anemia in chronic kidney disease: Secondary | ICD-10-CM | POA: Insufficient documentation

## 2020-04-08 DIAGNOSIS — I132 Hypertensive heart and chronic kidney disease with heart failure and with stage 5 chronic kidney disease, or end stage renal disease: Secondary | ICD-10-CM | POA: Insufficient documentation

## 2020-04-08 DIAGNOSIS — N179 Acute kidney failure, unspecified: Secondary | ICD-10-CM

## 2020-04-08 DIAGNOSIS — N183 Chronic kidney disease, stage 3 unspecified: Secondary | ICD-10-CM

## 2020-04-08 DIAGNOSIS — D508 Other iron deficiency anemias: Secondary | ICD-10-CM

## 2020-04-08 LAB — CBC WITH DIFFERENTIAL/PLATELET
Abs Immature Granulocytes: 0.39 10*3/uL — ABNORMAL HIGH (ref 0.00–0.07)
Basophils Absolute: 0 10*3/uL (ref 0.0–0.1)
Basophils Relative: 1 %
Eosinophils Absolute: 0 10*3/uL (ref 0.0–0.5)
Eosinophils Relative: 1 %
HCT: 28.8 % — ABNORMAL LOW (ref 39.0–52.0)
Hemoglobin: 9.4 g/dL — ABNORMAL LOW (ref 13.0–17.0)
Immature Granulocytes: 6 %
Lymphocytes Relative: 22 %
Lymphs Abs: 1.5 10*3/uL (ref 0.7–4.0)
MCH: 39 pg — ABNORMAL HIGH (ref 26.0–34.0)
MCHC: 32.6 g/dL (ref 30.0–36.0)
MCV: 119.5 fL — ABNORMAL HIGH (ref 80.0–100.0)
Monocytes Absolute: 1.6 10*3/uL — ABNORMAL HIGH (ref 0.1–1.0)
Monocytes Relative: 24 %
Neutro Abs: 3.1 10*3/uL (ref 1.7–7.7)
Neutrophils Relative %: 46 %
Platelets: 142 10*3/uL — ABNORMAL LOW (ref 150–400)
RBC: 2.41 MIL/uL — ABNORMAL LOW (ref 4.22–5.81)
RDW: 15.1 % (ref 11.5–15.5)
WBC: 6.5 10*3/uL (ref 4.0–10.5)
nRBC: 0 % (ref 0.0–0.2)

## 2020-04-08 MED ORDER — EPOETIN ALFA-EPBX 10000 UNIT/ML IJ SOLN
10000.0000 [IU] | Freq: Once | INTRAMUSCULAR | Status: AC
  Administered 2020-04-08: 10000 [IU] via SUBCUTANEOUS
  Filled 2020-04-08: qty 1

## 2020-04-08 MED ORDER — EPOETIN ALFA-EPBX 20000 UNIT/ML IJ SOLN
20000.0000 [IU] | Freq: Once | INTRAMUSCULAR | Status: AC
  Administered 2020-04-08: 20000 [IU] via SUBCUTANEOUS
  Filled 2020-04-08: qty 1

## 2020-04-08 NOTE — Patient Instructions (Signed)
[facility] at [facility] Discharge Instructions  Received Retacrit injection today. Follow-up as scheduled   Thank you for choosing [facility] at [facility] to provide your oncology and hematology care.  To afford each patient quality time with our provider, please arrive at least 15 minutes before your scheduled appointment time.   If you have a lab appointment with the Cancer Center please come in thru the Main Entrance and check in at the main information desk.  You need to re-schedule your appointment should you arrive 10 or more minutes late.  We strive to give you quality time with our providers, and arriving late affects you and other patients whose appointments are after yours.  Also, if you no show three or more times for appointments you may be dismissed from the clinic at the providers discretion.     Again, thank you for choosing [facility].  Our hope is that these requests will decrease the amount of time that you wait before being seen by our physicians.       _____________________________________________________________  Should you have questions after your visit to [facility], please contact our office at [phone] and follow the prompts.  Our office hours are 8:00 a.m. and 4:30 p.m. Monday - Friday.  Please note that voicemails left after 4:00 p.m. may not be returned until the following business day.  We are closed weekends and major holidays.  You do have access to a nurse 24-7, just call the main number to the clinic [phone] and do not press any options, hold on the line and a nurse will answer the phone.    For prescription refill requests, have your pharmacy contact our office and allow 72 hours.    Due to Covid, you will need to wear a mask upon entering the hospital. If you do not have a mask, a mask will be given to you at the Main Entrance upon arrival. For doctor visits, patients may  have 1 support person age 18 or older with them. For treatment visits, patients can not have anyone with them due to social distancing guidelines and our immunocompromised population.     

## 2020-04-08 NOTE — Progress Notes (Signed)
Jack Mejia tolerated Retacrit injection well without complaints or incident. Hgb 9.4 today. VSS Pt discharged self ambulatory in satisfactory condition

## 2020-04-09 ENCOUNTER — Encounter (HOSPITAL_COMMUNITY): Payer: Self-pay | Admitting: Hematology

## 2020-04-10 ENCOUNTER — Ambulatory Visit (HOSPITAL_COMMUNITY): Payer: Medicare HMO | Admitting: Nurse Practitioner

## 2020-04-11 ENCOUNTER — Encounter (HOSPITAL_COMMUNITY): Payer: Self-pay

## 2020-04-15 ENCOUNTER — Other Ambulatory Visit: Payer: Self-pay

## 2020-04-15 ENCOUNTER — Inpatient Hospital Stay (HOSPITAL_COMMUNITY): Payer: Medicare HMO | Attending: Hematology

## 2020-04-15 ENCOUNTER — Encounter (HOSPITAL_COMMUNITY): Payer: Self-pay

## 2020-04-15 ENCOUNTER — Inpatient Hospital Stay (HOSPITAL_COMMUNITY): Payer: Medicare HMO

## 2020-04-15 VITALS — BP 149/64 | HR 68 | Temp 97.0°F | Resp 18

## 2020-04-15 DIAGNOSIS — D509 Iron deficiency anemia, unspecified: Secondary | ICD-10-CM | POA: Insufficient documentation

## 2020-04-15 DIAGNOSIS — N179 Acute kidney failure, unspecified: Secondary | ICD-10-CM

## 2020-04-15 DIAGNOSIS — D539 Nutritional anemia, unspecified: Secondary | ICD-10-CM

## 2020-04-15 DIAGNOSIS — N183 Chronic kidney disease, stage 3 unspecified: Secondary | ICD-10-CM

## 2020-04-15 DIAGNOSIS — I132 Hypertensive heart and chronic kidney disease with heart failure and with stage 5 chronic kidney disease, or end stage renal disease: Secondary | ICD-10-CM | POA: Diagnosis not present

## 2020-04-15 DIAGNOSIS — D508 Other iron deficiency anemias: Secondary | ICD-10-CM

## 2020-04-15 LAB — CBC WITH DIFFERENTIAL/PLATELET
Abs Immature Granulocytes: 0.28 10*3/uL — ABNORMAL HIGH (ref 0.00–0.07)
Band Neutrophils: 1 %
Basophils Absolute: 0 10*3/uL (ref 0.0–0.1)
Basophils Relative: 0 %
Eosinophils Absolute: 0.2 10*3/uL (ref 0.0–0.5)
Eosinophils Relative: 4 %
HCT: 30.8 % — ABNORMAL LOW (ref 39.0–52.0)
Hemoglobin: 10.1 g/dL — ABNORMAL LOW (ref 13.0–17.0)
Lymphocytes Relative: 27 %
Lymphs Abs: 1.4 10*3/uL (ref 0.7–4.0)
MCH: 38.3 pg — ABNORMAL HIGH (ref 26.0–34.0)
MCHC: 32.8 g/dL (ref 30.0–36.0)
MCV: 116.7 fL — ABNORMAL HIGH (ref 80.0–100.0)
Metamyelocytes Relative: 1 %
Monocytes Absolute: 0.9 10*3/uL (ref 0.1–1.0)
Monocytes Relative: 17 %
Myelocytes: 1 %
Neutro Abs: 2.6 10*3/uL (ref 1.7–7.7)
Neutrophils Relative %: 49 %
Platelets: 120 10*3/uL — ABNORMAL LOW (ref 150–400)
RBC: 2.64 MIL/uL — ABNORMAL LOW (ref 4.22–5.81)
RDW: 15.1 % (ref 11.5–15.5)
WBC: 5.2 10*3/uL (ref 4.0–10.5)
nRBC: 0 % (ref 0.0–0.2)

## 2020-04-15 MED ORDER — EPOETIN ALFA-EPBX 20000 UNIT/ML IJ SOLN
20000.0000 [IU] | Freq: Once | INTRAMUSCULAR | Status: AC
  Administered 2020-04-15: 20000 [IU] via SUBCUTANEOUS
  Filled 2020-04-15: qty 1

## 2020-04-15 MED ORDER — EPOETIN ALFA-EPBX 10000 UNIT/ML IJ SOLN
10000.0000 [IU] | Freq: Once | INTRAMUSCULAR | Status: AC
  Administered 2020-04-15: 10000 [IU] via SUBCUTANEOUS
  Filled 2020-04-15: qty 1

## 2020-04-15 NOTE — Progress Notes (Signed)
Jack Mejia tolerated Retacrit injection well without complaints or incident. Hgb 10.1 today. VSS Pt discharged self ambulatory using his cane in satisfactory condition

## 2020-04-15 NOTE — Patient Instructions (Signed)
[facility] at [facility] Discharge Instructions  Received Retacrit injection today. Follow-up as scheduled   Thank you for choosing [facility] at [facility] to provide your oncology and hematology care.  To afford each patient quality time with our provider, please arrive at least 15 minutes before your scheduled appointment time.   If you have a lab appointment with the Cancer Center please come in thru the Main Entrance and check in at the main information desk.  You need to re-schedule your appointment should you arrive 10 or more minutes late.  We strive to give you quality time with our providers, and arriving late affects you and other patients whose appointments are after yours.  Also, if you no show three or more times for appointments you may be dismissed from the clinic at the providers discretion.     Again, thank you for choosing [facility].  Our hope is that these requests will decrease the amount of time that you wait before being seen by our physicians.       _____________________________________________________________  Should you have questions after your visit to [facility], please contact our office at [phone] and follow the prompts.  Our office hours are 8:00 a.m. and 4:30 p.m. Monday - Friday.  Please note that voicemails left after 4:00 p.m. may not be returned until the following business day.  We are closed weekends and major holidays.  You do have access to a nurse 24-7, just call the main number to the clinic [phone] and do not press any options, hold on the line and a nurse will answer the phone.    For prescription refill requests, have your pharmacy contact our office and allow 72 hours.    Due to Covid, you will need to wear a mask upon entering the hospital. If you do not have a mask, a mask will be given to you at the Main Entrance upon arrival. For doctor visits, patients may  have 1 support person age 18 or older with them. For treatment visits, patients can not have anyone with them due to social distancing guidelines and our immunocompromised population.     

## 2020-04-22 ENCOUNTER — Other Ambulatory Visit: Payer: Self-pay

## 2020-04-22 ENCOUNTER — Inpatient Hospital Stay (HOSPITAL_COMMUNITY): Payer: Medicare HMO

## 2020-04-22 ENCOUNTER — Other Ambulatory Visit: Payer: Self-pay | Admitting: *Deleted

## 2020-04-22 ENCOUNTER — Encounter (HOSPITAL_COMMUNITY): Payer: Self-pay

## 2020-04-22 ENCOUNTER — Telehealth: Payer: Self-pay | Admitting: Family Medicine

## 2020-04-22 VITALS — BP 139/75 | HR 66 | Temp 96.9°F | Resp 18

## 2020-04-22 DIAGNOSIS — N183 Chronic kidney disease, stage 3 unspecified: Secondary | ICD-10-CM

## 2020-04-22 DIAGNOSIS — N179 Acute kidney failure, unspecified: Secondary | ICD-10-CM

## 2020-04-22 DIAGNOSIS — I132 Hypertensive heart and chronic kidney disease with heart failure and with stage 5 chronic kidney disease, or end stage renal disease: Secondary | ICD-10-CM | POA: Diagnosis not present

## 2020-04-22 DIAGNOSIS — D539 Nutritional anemia, unspecified: Secondary | ICD-10-CM

## 2020-04-22 DIAGNOSIS — D508 Other iron deficiency anemias: Secondary | ICD-10-CM

## 2020-04-22 LAB — CBC WITH DIFFERENTIAL/PLATELET
Abs Immature Granulocytes: 0.16 10*3/uL — ABNORMAL HIGH (ref 0.00–0.07)
Basophils Absolute: 0 10*3/uL (ref 0.0–0.1)
Basophils Relative: 1 %
Eosinophils Absolute: 0 10*3/uL (ref 0.0–0.5)
Eosinophils Relative: 1 %
HCT: 33.9 % — ABNORMAL LOW (ref 39.0–52.0)
Hemoglobin: 10.9 g/dL — ABNORMAL LOW (ref 13.0–17.0)
Immature Granulocytes: 4 %
Lymphocytes Relative: 29 %
Lymphs Abs: 1.2 10*3/uL (ref 0.7–4.0)
MCH: 38.7 pg — ABNORMAL HIGH (ref 26.0–34.0)
MCHC: 32.2 g/dL (ref 30.0–36.0)
MCV: 120.2 fL — ABNORMAL HIGH (ref 80.0–100.0)
Monocytes Absolute: 1.1 10*3/uL — ABNORMAL HIGH (ref 0.1–1.0)
Monocytes Relative: 26 %
Neutro Abs: 1.6 10*3/uL — ABNORMAL LOW (ref 1.7–7.7)
Neutrophils Relative %: 39 %
Platelets: 121 10*3/uL — ABNORMAL LOW (ref 150–400)
RBC: 2.82 MIL/uL — ABNORMAL LOW (ref 4.22–5.81)
RDW: 14.8 % (ref 11.5–15.5)
WBC: 4 10*3/uL (ref 4.0–10.5)
nRBC: 0 % (ref 0.0–0.2)

## 2020-04-22 MED ORDER — DILTIAZEM HCL ER COATED BEADS 360 MG PO CP24
360.0000 mg | ORAL_CAPSULE | Freq: Every day | ORAL | 1 refills | Status: DC
Start: 2020-04-22 — End: 2020-07-15

## 2020-04-22 MED ORDER — GLIPIZIDE 5 MG PO TABS
10.0000 mg | ORAL_TABLET | Freq: Two times a day (BID) | ORAL | 1 refills | Status: DC
Start: 2020-04-22 — End: 2020-07-11

## 2020-04-22 MED ORDER — EPOETIN ALFA-EPBX 20000 UNIT/ML IJ SOLN
20000.0000 [IU] | Freq: Once | INTRAMUSCULAR | Status: AC
  Administered 2020-04-22: 20000 [IU] via SUBCUTANEOUS
  Filled 2020-04-22: qty 1

## 2020-04-22 MED ORDER — EPOETIN ALFA-EPBX 10000 UNIT/ML IJ SOLN
10000.0000 [IU] | Freq: Once | INTRAMUSCULAR | Status: AC
  Administered 2020-04-22: 10000 [IU] via SUBCUTANEOUS
  Filled 2020-04-22: qty 1

## 2020-04-22 NOTE — Patient Instructions (Signed)
[facility] at [facility] Discharge Instructions  Received Retacrit injection today. Follow-up as scheduled   Thank you for choosing [facility] at [facility] to provide your oncology and hematology care.  To afford each patient quality time with our provider, please arrive at least 15 minutes before your scheduled appointment time.   If you have a lab appointment with the Cancer Center please come in thru the Main Entrance and check in at the main information desk.  You need to re-schedule your appointment should you arrive 10 or more minutes late.  We strive to give you quality time with our providers, and arriving late affects you and other patients whose appointments are after yours.  Also, if you no show three or more times for appointments you may be dismissed from the clinic at the providers discretion.     Again, thank you for choosing [facility].  Our hope is that these requests will decrease the amount of time that you wait before being seen by our physicians.       _____________________________________________________________  Should you have questions after your visit to [facility], please contact our office at [phone] and follow the prompts.  Our office hours are 8:00 a.m. and 4:30 p.m. Monday - Friday.  Please note that voicemails left after 4:00 p.m. may not be returned until the following business day.  We are closed weekends and major holidays.  You do have access to a nurse 24-7, just call the main number to the clinic [phone] and do not press any options, hold on the line and a nurse will answer the phone.    For prescription refill requests, have your pharmacy contact our office and allow 72 hours.    Due to Covid, you will need to wear a mask upon entering the hospital. If you do not have a mask, a mask will be given to you at the Main Entrance upon arrival. For doctor visits, patients may  have 1 support person age 18 or older with them. For treatment visits, patients can not have anyone with them due to social distancing guidelines and our immunocompromised population.     

## 2020-04-22 NOTE — Progress Notes (Signed)
Jack Mejia tolerated Retacrit injection well without complaints or incident. Hgb 10.9 today VSS Pt discharged self ambulatory in satisfactory condition

## 2020-04-22 NOTE — Telephone Encounter (Signed)
[facility] requesting refills on Glipizide 5 mg, Metformin 500 mg and Famotidine 40 mg tablet. Pt last seen 01/25/20 for pain/swelling ankle. Please advise. Thank you

## 2020-04-22 NOTE — Telephone Encounter (Signed)
Labs showing elevated Cr at 2.59, pt needing to stop metformin and will increase his glipizide.  Will send glipizide 5mg  bid with meals.  Stop metformin.  And follow up in office in next 1-2 wks to review labs if he doesn't have appt. Thx. Dr. Lovena Le

## 2020-04-23 MED ORDER — EMPAGLIFLOZIN 10 MG PO TABS: 10.0000 mg | ORAL_TABLET | Freq: Every day | ORAL | 2 refills | Status: DC

## 2020-04-23 NOTE — Telephone Encounter (Signed)
Absalom called and wants to talk to the nurse that called him he has questions about the new medication. All nurses was unavailable at the time of call.   Pt call back [phone]

## 2020-04-23 NOTE — Telephone Encounter (Signed)
Yes, just continue with the glipizide two 5mg  tab bid. Thx. Dr. Lovena Le Check on starting jardiance.

## 2020-04-23 NOTE — Telephone Encounter (Signed)
Patient significant other (DPR) returned call and verbalized understanding. Pt is having treatments for Anemia. Pt being treated by hematologist at [facility]. Has had 3 injections and has one more to go. Pt significant other wanted provider to know. Pt will call back to schedule appt.

## 2020-04-23 NOTE — Telephone Encounter (Signed)
Left message to return call 

## 2020-04-23 NOTE — Telephone Encounter (Signed)
Discussed with pt and he made a follow up. Need to clarify his glipizide. Pt states he is already taking 5mg  2 bid and the note states to increase to 5mg  bid and 5mg  2bid was sent in. Pt was already on that dose he states. Did you want to increase him or keep him 5mg  2 bid. He states he will stop metformin and check on cost of jardiance tomorrow.

## 2020-04-23 NOTE — Telephone Encounter (Signed)
Contacted Ann(significant other-DRP) and Ann verbalized understanding.

## 2020-04-23 NOTE — Telephone Encounter (Signed)
Pt.notified

## 2020-04-25 ENCOUNTER — Other Ambulatory Visit: Payer: Self-pay | Admitting: Family Medicine

## 2020-04-29 ENCOUNTER — Inpatient Hospital Stay (HOSPITAL_COMMUNITY): Payer: Medicare HMO

## 2020-04-29 ENCOUNTER — Other Ambulatory Visit: Payer: Self-pay

## 2020-04-29 ENCOUNTER — Inpatient Hospital Stay (HOSPITAL_COMMUNITY): Payer: Medicare HMO | Attending: Hematology | Admitting: Hematology

## 2020-04-29 VITALS — BP 154/67 | HR 87 | Temp 96.9°F | Resp 19 | Wt 206.8 lb

## 2020-04-29 DIAGNOSIS — R0602 Shortness of breath: Secondary | ICD-10-CM | POA: Insufficient documentation

## 2020-04-29 DIAGNOSIS — E785 Hyperlipidemia, unspecified: Secondary | ICD-10-CM | POA: Diagnosis not present

## 2020-04-29 DIAGNOSIS — Z87891 Personal history of nicotine dependence: Secondary | ICD-10-CM | POA: Insufficient documentation

## 2020-04-29 DIAGNOSIS — N179 Acute kidney failure, unspecified: Secondary | ICD-10-CM

## 2020-04-29 DIAGNOSIS — R350 Frequency of micturition: Secondary | ICD-10-CM | POA: Insufficient documentation

## 2020-04-29 DIAGNOSIS — Z809 Family history of malignant neoplasm, unspecified: Secondary | ICD-10-CM | POA: Diagnosis not present

## 2020-04-29 DIAGNOSIS — D631 Anemia in chronic kidney disease: Secondary | ICD-10-CM | POA: Insufficient documentation

## 2020-04-29 DIAGNOSIS — N189 Chronic kidney disease, unspecified: Secondary | ICD-10-CM | POA: Insufficient documentation

## 2020-04-29 DIAGNOSIS — I2510 Atherosclerotic heart disease of native coronary artery without angina pectoris: Secondary | ICD-10-CM | POA: Insufficient documentation

## 2020-04-29 DIAGNOSIS — D539 Nutritional anemia, unspecified: Secondary | ICD-10-CM | POA: Diagnosis not present

## 2020-04-29 DIAGNOSIS — I129 Hypertensive chronic kidney disease with stage 1 through stage 4 chronic kidney disease, or unspecified chronic kidney disease: Secondary | ICD-10-CM | POA: Diagnosis not present

## 2020-04-29 DIAGNOSIS — Z79899 Other long term (current) drug therapy: Secondary | ICD-10-CM | POA: Insufficient documentation

## 2020-04-29 DIAGNOSIS — D469 Myelodysplastic syndrome, unspecified: Secondary | ICD-10-CM | POA: Insufficient documentation

## 2020-04-29 DIAGNOSIS — M109 Gout, unspecified: Secondary | ICD-10-CM | POA: Diagnosis not present

## 2020-04-29 DIAGNOSIS — R630 Anorexia: Secondary | ICD-10-CM | POA: Diagnosis not present

## 2020-04-29 DIAGNOSIS — Z87442 Personal history of urinary calculi: Secondary | ICD-10-CM | POA: Insufficient documentation

## 2020-04-29 DIAGNOSIS — Z7901 Long term (current) use of anticoagulants: Secondary | ICD-10-CM | POA: Diagnosis not present

## 2020-04-29 DIAGNOSIS — I4891 Unspecified atrial fibrillation: Secondary | ICD-10-CM | POA: Diagnosis not present

## 2020-04-29 DIAGNOSIS — E119 Type 2 diabetes mellitus without complications: Secondary | ICD-10-CM | POA: Insufficient documentation

## 2020-04-29 DIAGNOSIS — F329 Major depressive disorder, single episode, unspecified: Secondary | ICD-10-CM | POA: Insufficient documentation

## 2020-04-29 DIAGNOSIS — D508 Other iron deficiency anemias: Secondary | ICD-10-CM

## 2020-04-29 DIAGNOSIS — N183 Chronic kidney disease, stage 3 unspecified: Secondary | ICD-10-CM

## 2020-04-29 LAB — CBC WITH DIFFERENTIAL/PLATELET
Abs Immature Granulocytes: 0.12 10*3/uL — ABNORMAL HIGH (ref 0.00–0.07)
Basophils Absolute: 0 10*3/uL (ref 0.0–0.1)
Basophils Relative: 0 %
Eosinophils Absolute: 0 10*3/uL (ref 0.0–0.5)
Eosinophils Relative: 0 %
HCT: 32.6 % — ABNORMAL LOW (ref 39.0–52.0)
Hemoglobin: 10.6 g/dL — ABNORMAL LOW (ref 13.0–17.0)
Immature Granulocytes: 3 %
Lymphocytes Relative: 15 %
Lymphs Abs: 0.7 10*3/uL (ref 0.7–4.0)
MCH: 37.9 pg — ABNORMAL HIGH (ref 26.0–34.0)
MCHC: 32.5 g/dL (ref 30.0–36.0)
MCV: 116.4 fL — ABNORMAL HIGH (ref 80.0–100.0)
Monocytes Absolute: 1.4 10*3/uL — ABNORMAL HIGH (ref 0.1–1.0)
Monocytes Relative: 32 %
Neutro Abs: 2.2 10*3/uL (ref 1.7–7.7)
Neutrophils Relative %: 50 %
Platelets: 110 10*3/uL — ABNORMAL LOW (ref 150–400)
RBC: 2.8 MIL/uL — ABNORMAL LOW (ref 4.22–5.81)
RDW: 14.4 % (ref 11.5–15.5)
WBC: 4.4 10*3/uL (ref 4.0–10.5)
nRBC: 0 % (ref 0.0–0.2)

## 2020-04-29 MED ORDER — EPOETIN ALFA-EPBX 10000 UNIT/ML IJ SOLN: 10000.0000 [IU] | Freq: Once | INTRAMUSCULAR | Status: DC

## 2020-04-29 MED ORDER — EPOETIN ALFA-EPBX 40000 UNIT/ML IJ SOLN
40000.0000 [IU] | Freq: Once | INTRAMUSCULAR | Status: AC
  Administered 2020-04-29: 40000 [IU] via SUBCUTANEOUS
  Filled 2020-04-29: qty 1

## 2020-04-29 MED ORDER — EPOETIN ALFA-EPBX 20000 UNIT/ML IJ SOLN
INTRAMUSCULAR | Status: AC
  Filled 2020-04-29: qty 1

## 2020-04-29 NOTE — Progress Notes (Signed)
Per RN confirmed Retacrit dose at 40,000 units SQ q 2 weeks.  T.O. Dr Jim Desanctis, RN/Anicia Leuthold Ronnald Ramp, PharmD

## 2020-04-29 NOTE — Progress Notes (Signed)
1144 Labs reviewed with and pt seen by Dr. Delton Coombes and pt approved for Retacrit injection today with dose increased to 40,000 units/ml every 2 weeks per MD               Jack Mejia tolerated Retacrit injection well without complaints or incident.VSS Pt discharged self ambulatory using his cane in satisfactory condition accompanied by his wife

## 2020-04-29 NOTE — Progress Notes (Signed)
[employer] [place], [place] [address]   CLINIC:  Medical Oncology/Hematology  PCP:  Erven Colla, DO [address] / [place] [address]  [phone]  REASON FOR VISIT:  Follow-up for macrocytic anemia  PRIOR THERAPY: Venofer  CURRENT THERAPY: Retacrit every 2 weeks  INTERVAL HISTORY:  [name], a 74 y.o. male, returns for routine follow-up for his macrocytic anemia. Delmus was last seen on 04/04/2020.  Today he is accompanied by his wife. He reports feeling terrible and that it is worsening. He has not seen a cardiologist since his previous one left. His main complaint is urinary frequency and pain in his legs from gout with jitteriness. He continues feeling fatigued and has SOB with exertion.   REVIEW OF SYSTEMS:  Review of Systems  Constitutional: Positive for appetite change (50%) and fatigue (depleted).  Respiratory: Positive for shortness of breath (w/ exertion).   Genitourinary: Positive for frequency.   Musculoskeletal: Positive for arthralgias (7/10 R foot, legs and side pain).  Neurological: Positive for dizziness (occasional) and numbness (& burning in legs).  All other systems reviewed and are negative.   PAST MEDICAL/SURGICAL HISTORY:  Past Medical History:  Diagnosis Date  . Arthritis   . Atrial fibrillation ([place])    a. s/p DCCV in 03/2019  . CAD (coronary artery disease) 06/29/2019  . Cramps of left lower extremity   . Depression   . Diabetes mellitus    type 2 for 7-8 yrs  . Dyspnea   . Elevated liver enzymes   . Fatty liver   . History of kidney stones   . Hyperlipidemia   . Hypertension   . Vertigo    Past Surgical History:  Procedure Laterality Date  . CARDIOVERSION N/A 04/21/2019   Procedure: CARDIOVERSION;  Surgeon: Sanda Klein, MD;  Location: [facility];  Service: Cardiovascular;  Laterality: N/A;  . CARPAL TUNNEL RELEASE     both wrist  . cataract surgery     bilateral  .  CHOLECYSTECTOMY    . EYE SURGERY    . HEMORROIDECTOMY    . KNEE SURGERY Left   . LEFT HEART CATH AND CORONARY ANGIOGRAPHY N/A 05/23/2019   Procedure: LEFT HEART CATH AND CORONARY ANGIOGRAPHY;  Surgeon: Belva Crome, MD;  Location: [facility];  Service: Cardiovascular;  Laterality: N/A;  . LUMBAR LAMINECTOMY/DECOMPRESSION MICRODISCECTOMY N/A 08/06/2016   Procedure: LUMBAR THREE- LUMBAR FIVE  DECOMPRESSIVE LUMBAR LAMINECTOMY;  Surgeon: Jovita Gamma, MD;  Location: [facility];  Service: Neurosurgery;  Laterality: N/A;    SOCIAL HISTORY:  Social History   Socioeconomic History  . Marital status: Divorced    Spouse name: Not on file  . Number of children: 2  . Years of education: Not on file  . Highest education level: Not on file  Occupational History  . Occupation: RETIRED  Tobacco Use  . Smoking status: Former Smoker    Packs/day: 4.00    Years: 20.00    Pack years: 80.00    Types: Cigarettes    Quit date: 10/14/1982    Years since quitting: 37.5  . Smokeless tobacco: Never Used  . Tobacco comment: 29 yrs ago  Vaping Use  . Vaping Use: Never used  Substance and Sexual Activity  . Alcohol use: No  . Drug use: No  . Sexual activity: Not Currently    Birth control/protection: None  Other Topics Concern  . Not on file  Social History Narrative  .  Not on file   Social Determinants of Health   Financial Resource Strain:   . Difficulty of Paying Living Expenses: Not on file  Food Insecurity:   . Worried About Charity fundraiser in the Last Year: Not on file  . Ran Out of Food in the Last Year: Not on file  Transportation Needs:   . Lack of Transportation (Medical): Not on file  . Lack of Transportation (Non-Medical): Not on file  Physical Activity:   . Days of Exercise per Week: Not on file  . Minutes of Exercise per Session: Not on file  Stress:   . Feeling of Stress : Not on file  Social Connections:   . Frequency of Communication with Friends and Family:  Not on file  . Frequency of Social Gatherings with Friends and Family: Not on file  . Attends Religious Services: Not on file  . Active Member of Clubs or Organizations: Not on file  . Attends Archivist Meetings: Not on file  . Marital Status: Not on file  Intimate Partner Violence:   . Fear of Current or Ex-Partner: Not on file  . Emotionally Abused: Not on file  . Physically Abused: Not on file  . Sexually Abused: Not on file    FAMILY HISTORY:  Family History  Problem Relation Age of Onset  . Diabetes Mother   . Cancer Mother        unknown kind  . Diabetes Sister   . COPD Sister   . Liver disease Brother   . COPD Brother   . Diabetes Maternal Grandmother   . Diabetes Brother   . COPD Sister   . Healthy Daughter   . Healthy Daughter     CURRENT MEDICATIONS:  Current Outpatient Medications  Medication Sig Dispense Refill  . amiodarone (PACERONE) 200 MG tablet TAKE 1 TABLET BY MOUTH EVERY DAY 90 tablet 2  . apixaban (ELIQUIS) 5 MG TABS tablet Take 1 tablet (5 mg total) by mouth 2 (two) times daily. 60 tablet 6  . atorvastatin (LIPITOR) 20 MG tablet Take 1 tablet (20 mg total) by mouth daily. 90 tablet 3  . diltiazem (CARDIZEM CD) 360 MG 24 hr capsule Take 1 capsule (360 mg total) by mouth daily. 90 capsule 1  . empagliflozin (JARDIANCE) 10 MG TABS tablet Take 1 tablet (10 mg total) by mouth daily before breakfast. 30 tablet 2  . glipiZIDE (GLUCOTROL) 5 MG tablet Take 2 tablets (10 mg total) by mouth 2 (two) times daily. 120 tablet 1  . methocarbamol (ROBAXIN) 500 MG tablet Take 1 tablet (500 mg total) by mouth 2 (two) times daily as needed for muscle spasms. 20 tablet 0  . metoprolol tartrate (LOPRESSOR) 25 MG tablet TAKE 1 TABLET BY MOUTH TWICE A DAY 60 tablet 0  . naproxen (NAPROSYN) 500 MG tablet Take 1 tablet (500 mg total) by mouth 2 (two) times daily with a meal. 30 tablet 0  . torsemide (DEMADEX) 20 MG tablet Take 1 tablet (20 mg total) by mouth 2 (two)  times daily. 90 tablet 3   No current facility-administered medications for this visit.    ALLERGIES:  Allergies  Allergen Reactions  . Demerol Nausea And Vomiting  . Vasotec [Enalapril] Cough  . Lasix [Furosemide] Rash    PHYSICAL EXAM:  Performance status (ECOG): 1 - Symptomatic but completely ambulatory  Vitals:   04/29/20 1109  BP: (!) 154/67  Pulse: 87  Resp: 19  Temp: (!) 96.9 F (36.1  C)  SpO2: 96%   Wt Readings from Last 3 Encounters:  04/29/20 206 lb 12.8 oz (93.8 kg)  04/04/20 208 lb 12.4 oz (94.7 kg)  03/23/20 210 lb (95.3 kg)   Physical Exam Vitals reviewed.  Constitutional:      Appearance: Normal appearance.  Cardiovascular:     Rate and Rhythm: Normal rate. Rhythm irregular.     Pulses: Normal pulses.     Heart sounds: Normal heart sounds.  Pulmonary:     Effort: Pulmonary effort is normal.     Breath sounds: Normal breath sounds.  Neurological:     General: No focal deficit present.     Mental Status: He is alert and oriented to person, place, and time.  Psychiatric:        Mood and Affect: Mood normal.        Behavior: Behavior normal.     LABORATORY DATA:  I have reviewed the labs as listed.  CBC Latest Ref Rng & Units 04/29/2020 04/22/2020 04/15/2020  WBC 4.0 - 10.5 K/uL 4.4 4.0 5.2  Hemoglobin 13.0 - 17.0 g/dL 10.6(L) 10.9(L) 10.1(L)  Hematocrit 39 - 52 % 32.6(L) 33.9(L) 30.8(L)  Platelets 150 - 400 K/uL 110(L) 121(L) 120(L)   CMP Latest Ref Rng & Units 04/04/2020 03/29/2020 12/14/2019  Glucose 70 - 99 mg/dL 264(H) 172(H) 214(H)  BUN 8 - 23 mg/dL 39(H) 34(H) 41(H)  Creatinine 0.61 - 1.24 mg/dL 2.59(H) 2.18(H) 2.39(H)  Sodium 135 - 145 mmol/L 139 140 142  Potassium 3.5 - 5.1 mmol/L 4.4 4.1 4.6  Chloride 98 - 111 mmol/L 100 104 98  CO2 22 - 32 mmol/L _0 Calcium 8.9 - 10.3 mg/dL 9.5 9.0 9.2  Total Protein 6.5 - 8.1 g/dL 7.2 - 7.8  Total Bilirubin 0.3 - 1.2 mg/dL 1.2 - 1.1  Alkaline Phos 38 - 126 U/L 76 - 90  AST 15 - 41 U/L 28 - 28   ALT 0 - 44 U/L 33 - 29      Component Value Date/Time   RBC 2.80 (L) 04/29/2020 1044   MCV 116.4 (H) 04/29/2020 1044   MCV 101 (H) 09/07/2018 1638   MCH 37.9 (H) 04/29/2020 1044   MCHC 32.5 04/29/2020 1044   RDW 14.4 04/29/2020 1044   RDW 12.8 09/07/2018 1638   LYMPHSABS 0.7 04/29/2020 1044   LYMPHSABS 1.3 09/07/2018 1638   MONOABS 1.4 (H) 04/29/2020 1044   EOSABS 0.0 04/29/2020 1044   EOSABS 0.2 09/07/2018 1638   BASOSABS 0.0 04/29/2020 1044   BASOSABS 0.1 09/07/2018 1638   Lab Results  Component Value Date   VD25OH 24.68 (L) 04/04/2020    DIAGNOSTIC IMAGING:  I have independently reviewed the scans and discussed with the patient. No results found.   ASSESSMENT:  1. Macrocytic anemia: -Patient seen at the request of Dr. Wolfgang Phoenix for macrocytic anemia. -CTAP on 04/15/2017 showed no splenomegaly. -Patient has macrocytosis noted since February 2020, gradually worsening. -Colonoscopy on 06/05/2010 shows small polyp in the sigmoid colon colon, right colon, hepatic flexure and transverse colon. -He continues to have occasional bleeding on the tissue paper since Eliquis was started. -Serum erythropoietin was 77.2. -SPEP was negative. Other nutritional deficiencies were ruled out. -Bone marrow biopsy on 03/29/2020 shows hypercellular marrow with trilineage dysplasia.  Blasts are 2%.  Chromosome analysis and FISH panel were normal. -5 doses of Venofer completed on 01/24/2020. -Retacrit 30,000 units weekly started on 04/08/2020.  2. CKD: -History of CKD since 2017. -Renal ultrasound on 04/21/2019  shows bilateral renal cysts.   PLAN:  1.  Low risk MDS: -He was started on Retacrit 30,000 units on 04/08/2020. -His hemoglobin improved to 10.6. -We will increase Retacrit to 40,000 units and change the frequency to every 2 weeks. -We will reevaluate in 2 months.  Will check ferritin and iron panel at that time.  2. CKD: -Creatinine is stable around 2.5.  3. Atrial  fibrillation: -Continue Eliquis 5 mg twice daily.  4.  Dyspnea on exertion: -He still continues to have dyspnea on exertion. -We will make a referral to cardiology.  Orders placed this encounter:  No orders of the defined types were placed in this encounter.    Derek Jack, MD [place] [phone]   I, Milinda Antis, am acting as a scribe for Dr. Sanda Linger.  I, Derek Jack MD, have reviewed the above documentation for accuracy and completeness, and I agree with the above.

## 2020-04-29 NOTE — Patient Instructions (Signed)
[place] at [facility] Discharge Instructions  You were seen today by Dr. Delton Coombes. He went over your recent results. You received your injection for your blood today; you will be receiving injections every 2 weeks. You will be referred to a cardiologist for managing your shortness of breath. Dr. Delton Coombes will see you back in 2 months for labs and follow up.   Thank you for choosing [place] at [facility] to provide your oncology and hematology care.  To afford each patient quality time with our provider, please arrive at least 15 minutes before your scheduled appointment time.   If you have a lab appointment with the [facility] please come in thru the Main Entrance and check in at the main information desk  You need to re-schedule your appointment should you arrive 10 or more minutes late.  We strive to give you quality time with our providers, and arriving late affects you and other patients whose appointments are after yours.  Also, if you no show three or more times for appointments you may be dismissed from the clinic at the providers discretion.     Again, thank you for choosing [facility].  Our hope is that these requests will decrease the amount of time that you wait before being seen by our physicians.       _____________________________________________________________  Should you have questions after your visit to [facility], please contact our office at [phone] between the hours of 8:00 a.m. and 4:30 p.m.  Voicemails left after 4:00 p.m. will not be returned until the following business day.  For prescription refill requests, have your pharmacy contact our office and allow 72 hours.    Cancer Center Support Programs:   > Cancer Support Group  2nd Tuesday of the month 1pm-2pm, Journey Room

## 2020-04-29 NOTE — Patient Instructions (Signed)
[facility] at [facility] Discharge Instructions  Received Retacrit injection today. Follow-up as scheduled   Thank you for choosing [facility] at [facility] to provide your oncology and hematology care.  To afford each patient quality time with our provider, please arrive at least 15 minutes before your scheduled appointment time.   If you have a lab appointment with the Cancer Center please come in thru the Main Entrance and check in at the main information desk.  You need to re-schedule your appointment should you arrive 10 or more minutes late.  We strive to give you quality time with our providers, and arriving late affects you and other patients whose appointments are after yours.  Also, if you no show three or more times for appointments you may be dismissed from the clinic at the providers discretion.     Again, thank you for choosing [facility].  Our hope is that these requests will decrease the amount of time that you wait before being seen by our physicians.       _____________________________________________________________  Should you have questions after your visit to [facility], please contact our office at [phone] and follow the prompts.  Our office hours are 8:00 a.m. and 4:30 p.m. Monday - Friday.  Please note that voicemails left after 4:00 p.m. may not be returned until the following business day.  We are closed weekends and major holidays.  You do have access to a nurse 24-7, just call the main number to the clinic [phone] and do not press any options, hold on the line and a nurse will answer the phone.    For prescription refill requests, have your pharmacy contact our office and allow 72 hours.    Due to Covid, you will need to wear a mask upon entering the hospital. If you do not have a mask, a mask will be given to you at the Main Entrance upon arrival. For doctor visits, patients may  have 1 support person age 18 or older with them. For treatment visits, patients can not have anyone with them due to social distancing guidelines and our immunocompromised population.     

## 2020-04-30 ENCOUNTER — Ambulatory Visit (INDEPENDENT_AMBULATORY_CARE_PROVIDER_SITE_OTHER): Payer: Medicare HMO | Admitting: Family Medicine

## 2020-04-30 ENCOUNTER — Encounter: Payer: Self-pay | Admitting: Family Medicine

## 2020-04-30 VITALS — BP 160/78 | HR 93 | Temp 97.1°F | Wt 206.4 lb

## 2020-04-30 DIAGNOSIS — M109 Gout, unspecified: Secondary | ICD-10-CM | POA: Diagnosis not present

## 2020-04-30 DIAGNOSIS — E118 Type 2 diabetes mellitus with unspecified complications: Secondary | ICD-10-CM

## 2020-04-30 DIAGNOSIS — I120 Hypertensive chronic kidney disease with stage 5 chronic kidney disease or end stage renal disease: Secondary | ICD-10-CM | POA: Diagnosis not present

## 2020-04-30 DIAGNOSIS — E785 Hyperlipidemia, unspecified: Secondary | ICD-10-CM | POA: Diagnosis not present

## 2020-04-30 DIAGNOSIS — N185 Chronic kidney disease, stage 5: Secondary | ICD-10-CM | POA: Diagnosis not present

## 2020-04-30 LAB — POCT GLYCOSYLATED HEMOGLOBIN (HGB A1C): Hemoglobin A1C: 6.2 % — AB (ref 4.0–5.6)

## 2020-04-30 LAB — GLUCOSE, POCT (MANUAL RESULT ENTRY): POC Glucose: 329 mg/dl — AB (ref 70–99)

## 2020-04-30 MED ORDER — PREDNISONE 10 MG PO TABS: ORAL_TABLET | ORAL | 0 refills | Status: DC

## 2020-04-30 MED ORDER — METOPROLOL TARTRATE 25 MG PO TABS
25.0000 mg | ORAL_TABLET | Freq: Two times a day (BID) | ORAL | 1 refills | Status: DC
Start: 2020-04-30 — End: 2020-07-15

## 2020-04-30 NOTE — Progress Notes (Signed)
Patient ID: Jack Mejia, male    DOB: 10-25-1945, 74 y.o.   MRN: [id number]   Chief Complaint  Patient presents with  . Hypertension    Patient reports BP being all over the place since starting torsemide  . Diabetes    Patient does not like the way the jardiance makes him feel and states it is causing urinary frequency and getting up at night. He states he has continued to take the metformin when he feels like he needs it.   . Hyperlipidemia   Subjective:    HPI  F/u DM2, htn, HLD- Pt stating having inc in urination with jardiance.  Not able to hold it. Pt had elevated Cr at 2.5 and advised to stop metformin and start jardiance. Stating it's expensive at $40 and not wanting to take it due to increase in urination and incontinence. Doesn't do fingersticks. Didn't have diabetic labs done.  Pt taking toresemide from Dr. Raliegh Ip in 5/21. Now having some elevated Cr since then.  Pt is with partner (girlfriend) today in office. She has been trying to help him with his medication and office visits.  Pt having rt foot pain, pt thinking it's gout.  The top of foot is painful.  Mild swelling.  No redness. No trauma to foot.  In past given prednisone and it helped.  Seen 7/21 and given prednisone taper for swollen ankle and possible gout. Had uric acid done at that time at was elevated at 13.2  Pt seen in ER on 03/23/20 for a fall and rt hip pain.  xrays done. No fracture at that time.  Medical History Jack Mejia has a past medical history of Arthritis, Atrial fibrillation ([place]), CAD (coronary artery disease) (06/29/2019), Cramps of left lower extremity, Depression, Diabetes mellitus, Dyspnea, Elevated liver enzymes, Fatty liver, History of kidney stones, Hyperlipidemia, Hypertension, and Vertigo.   Outpatient Encounter Medications as of 04/30/2020  Medication Sig  . amiodarone (PACERONE) 200 MG tablet TAKE 1 TABLET BY MOUTH EVERY DAY  . apixaban (ELIQUIS) 5 MG TABS tablet Take 1 tablet (5 mg  total) by mouth 2 (two) times daily.  [name] atorvastatin (LIPITOR) 20 MG tablet Take 1 tablet (20 mg total) by mouth daily.  [name] diltiazem (CARDIZEM CD) 360 MG 24 hr capsule Take 1 capsule (360 mg total) by mouth daily.  . empagliflozin (JARDIANCE) 10 MG TABS tablet Take 1 tablet (10 mg total) by mouth daily before breakfast.  . glipiZIDE (GLUCOTROL) 5 MG tablet Take 2 tablets (10 mg total) by mouth 2 (two) times daily.  . metoprolol tartrate (LOPRESSOR) 25 MG tablet Take 1 tablet (25 mg total) by mouth 2 (two) times daily.  . predniSONE (DELTASONE) 10 MG tablet Take 3 tab p.o. x3 days, then take 2 tab x 3 days, then 1 tab x 3 days.  . [DISCONTINUED] methocarbamol (ROBAXIN) 500 MG tablet Take 1 tablet (500 mg total) by mouth 2 (two) times daily as needed for muscle spasms.  . [DISCONTINUED] metoprolol tartrate (LOPRESSOR) 25 MG tablet TAKE 1 TABLET BY MOUTH TWICE A DAY  . [DISCONTINUED] naproxen (NAPROSYN) 500 MG tablet Take 1 tablet (500 mg total) by mouth 2 (two) times daily with a meal.  . [DISCONTINUED] torsemide (DEMADEX) 20 MG tablet Take 1 tablet (20 mg total) by mouth 2 (two) times daily.   No facility-administered encounter medications on file as of 04/30/2020.     Review of Systems  Constitutional: Negative for chills and fever.  HENT: Negative for congestion, rhinorrhea  and sore throat.   Respiratory: Negative for cough, shortness of breath and wheezing.   Cardiovascular: Negative for chest pain and leg swelling.  Gastrointestinal: Negative for abdominal pain, diarrhea, nausea and vomiting.  Genitourinary: Negative for dysuria and frequency.  Musculoskeletal: Positive for arthralgias.       +rt foot pain  Skin: Negative for rash.  Neurological: Negative for dizziness, weakness and headaches.     Vitals BP (!) 160/78   Pulse 93   Temp (!) 97.1 F (36.2 C)   Wt 206 lb 6.4 oz (93.6 kg)   SpO2 95%   BMI 29.62 kg/m   Objective:   Physical Exam Vitals and nursing note  reviewed.  Constitutional:      General: He is not in acute distress.    Appearance: Normal appearance. He is not ill-appearing.  HENT:     Head: Normocephalic.     Nose: Nose normal. No congestion.     Mouth/Throat:     Mouth: Mucous membranes are moist.     Pharynx: No oropharyngeal exudate.  Eyes:     Extraocular Movements: Extraocular movements intact.     Conjunctiva/sclera: Conjunctivae normal.     Pupils: Pupils are equal, round, and reactive to light.  Cardiovascular:     Rate and Rhythm: Normal rate and regular rhythm.     Pulses: Normal pulses.     Heart sounds: Normal heart sounds. No murmur heard.   Pulmonary:     Effort: Pulmonary effort is normal.     Breath sounds: Normal breath sounds. No wheezing, rhonchi or rales.  Musculoskeletal:        General: Swelling and tenderness present. Normal range of motion.     Right lower leg: No edema.     Left lower leg: No edema.     Comments: +rt foot- ttp over dorsum of foot, mild erythema. No warmth.  Mild swelling. Normal rom of foot/ankle.  Skin:    General: Skin is warm and dry.     Findings: No rash.  Neurological:     General: No focal deficit present.     Mental Status: He is alert and oriented to person, place, and time.     Cranial Nerves: No cranial nerve deficit.  Psychiatric:        Mood and Affect: Mood normal.        Behavior: Behavior normal.        Thought Content: Thought content normal.        Judgment: Judgment normal.      Assessment and Plan   1. Hypertensive kidney disease with CKD (chronic kidney disease) stage V ([place]) - Ambulatory referral to Nephrology  2. Type 2 diabetes mellitus with complication, without long-term current use of insulin (HCC) - POCT glycosylated hemoglobin (Hb A1C) - POCT Glucose (CBG)  3. Acute gout of right foot, unspecified cause  4. Hyperlipidemia LDL goal <100   htn- uncontrolled.  Pt uncomfortable today and in pain with left foot. Elevate bp and elevated  cr.  Pt ran out of metoprolol and bp is elevated today. Has refill already at pharmacy. Sent another refill for 6 month supply.  Afib, chf- stable, controlled.   Pt stating has appt with cards this week.  Cardiologist gave torsemide for acute on chronic chf. afib -cont taking eliquis, metoprolol, diltiazem, and lipitor.  Elevated Cr at 2.59.  I would hold off on colcrys for gout.  Will give small taper of prednisone and tylenol for pain.  Advised to stop metformin and to stop any nsaids due to elevated Cr.  Seems to have poor insight to his medical conditions.  Pt stating couldn't tolerate the inc urination with Jardiance.  Pt advising needing to follow up with kidney doctor about abnormal labs. Will cont to look for alternative for pt to take for diabetes since pt unable to stay on metformin due to CKD.  DM2- pt declining going to endocrinology.  Pt stating unable to take jardiance due to cost and incontinence issues with it.  Declining to take insulin. Looked into [place], but too expensive for him.  HLD- cont lipitor. Stable, controlled.  Lipids need to be rechecked.   F/u 3 mo.

## 2020-05-01 ENCOUNTER — Encounter: Payer: Self-pay | Admitting: Family Medicine

## 2020-05-01 NOTE — Progress Notes (Addendum)
Cardiology Office Note  Date: 05/02/2020   ID: KENDRA WOOLFORD, DOB 01-12-1946, MRN [id number]  PCP:  Erven Colla, DO  Cardiologist:  No primary care provider on file. Electrophysiologist:  None   Chief Complaint: Follow-up dyspnea on exertion, PAF, chronic diastolic heart failure, CAD,  History of Present Illness: Jack Mejia is a 74 y.o. male with a history of dyspnea on exertion, atrial fibrillation (status post DCCV 03/2019), CAD, HLD, HTN, DM 2, chronic diastolic heart failure, CKD stage III.  Last encounter with Dr. Bronson Ing 12/01/2019 via telemedicine.  He had been evaluated in ED on 11/01/2019 for shortness of breath.  He was found to be anemic with hemoglobin of 9.7.  Creatinine was 1.79, BNP was 308.  Troponins were normal.  Chest x-ray with vascular congestion and small bilateral pleural effusions.  He was given Lasix IV.  He stated he would get short of breath when walking 50 to 75 yards.  Was taking torsemide 5 mg p.o. twice daily.  He denied any anginal symptoms and was continue medical therapy with atorvastatin, beta-blocker.  Not on aspirin due to being on systemic anticoagulation with Eliquis.  Chronic exertional dyspnea was stable.  Dr. Bronson Ing stated it was likely due to chronic diastolic heart failure and anemia.  Torsemide was increased to 10 mg p.o. twice daily.  Patient had undergone DCCV on 04/21/2019 he remained on amiodarone, diltiazem and metoprolol.  He was symptomatically stable.  He was continuing Eliquis for anticoagulation.  Creatinine on 11/01/2019 was 1.79.  Blood pressure was elevated.  Patient stated he felt best when systolic blood pressure was 150.  Stated he did not feel well when BP under 867 systolic.  Patient's PCP, Dr. Lovena Le, recently saw him and noted decrease in renal function with creatinine of 2.26.  Patient had been taking torsemide 20 mg p.o. twice daily.  Renal function had been decreasing over the previous few months.  Dr. Lovena Le was  inquiring as to whether torsemide could be decreased due to worsening renal function.  Dr. Harl Bowie replied patient would be seen soon in clinic and if euvolemic may decrease torsemide dosage.  PCP also noted she had referred patient to nephrology.  Patient is here for complaints of shortness of breath.  Recently saw his primary care provider who noted increase in creatinine of 2.26.  Patient appears euvolemic today.  He does continue to complain of dyspnea on exertion which could be multifactorial.  He has been receiving iron infusions by Dr. Delton Coombes for anemia.  States his breathing is somewhat better but he still has some increased dyspnea on mild activity.  He is not very active on a daily basis due to mobility issues and walks with a cane.  He has diabetes with a recent hemoglobin A1c of 6.3%.   Denies any classic anginal symptoms but dyspnea could be an anginal equivalent given evidence of CAD.  Denies any orthostatic symptoms, CVA of TIA symptoms.  Admits to some mild orthopnea when lying flat.  States he has to sleep on his side.  No claudication-like symptoms, DVT or PE-like symptoms, lower extremity edema.   Past Medical History:  Diagnosis Date  . Arthritis   . Atrial fibrillation ([place])    a. s/p DCCV in 03/2019  . CAD (coronary artery disease) 06/29/2019  . Cramps of left lower extremity   . Depression   . Diabetes mellitus    type 2 for 7-8 yrs  . Dyspnea   . Elevated liver  enzymes   . Fatty liver   . History of kidney stones   . Hyperlipidemia   . Hypertension   . Vertigo     Past Surgical History:  Procedure Laterality Date  . CARDIOVERSION N/A 04/21/2019   Procedure: CARDIOVERSION;  Surgeon: Sanda Klein, MD;  Location: [facility];  Service: Cardiovascular;  Laterality: N/A;  . CARPAL TUNNEL RELEASE     both wrist  . cataract surgery     bilateral  . CHOLECYSTECTOMY    . EYE SURGERY    . HEMORROIDECTOMY    . KNEE SURGERY Left   . LEFT HEART CATH AND CORONARY  ANGIOGRAPHY N/A 05/23/2019   Procedure: LEFT HEART CATH AND CORONARY ANGIOGRAPHY;  Surgeon: Belva Crome, MD;  Location: [facility];  Service: Cardiovascular;  Laterality: N/A;  . LUMBAR LAMINECTOMY/DECOMPRESSION MICRODISCECTOMY N/A 08/06/2016   Procedure: LUMBAR THREE- LUMBAR FIVE  DECOMPRESSIVE LUMBAR LAMINECTOMY;  Surgeon: Jovita Gamma, MD;  Location: [facility];  Service: Neurosurgery;  Laterality: N/A;    Current Outpatient Medications  Medication Sig Dispense Refill  . amiodarone (PACERONE) 200 MG tablet TAKE 1 TABLET BY MOUTH EVERY DAY 90 tablet 2  . apixaban (ELIQUIS) 5 MG TABS tablet Take 1 tablet (5 mg total) by mouth 2 (two) times daily. 60 tablet 6  . atorvastatin (LIPITOR) 20 MG tablet Take 1 tablet (20 mg total) by mouth daily. 90 tablet 3  . diltiazem (CARDIZEM CD) 360 MG 24 hr capsule Take 1 capsule (360 mg total) by mouth daily. 90 capsule 1  . empagliflozin (JARDIANCE) 10 MG TABS tablet Take 1 tablet (10 mg total) by mouth daily before breakfast. 30 tablet 2  . glipiZIDE (GLUCOTROL) 5 MG tablet Take 2 tablets (10 mg total) by mouth 2 (two) times daily. 120 tablet 1  . metoprolol tartrate (LOPRESSOR) 25 MG tablet Take 1 tablet (25 mg total) by mouth 2 (two) times daily. 180 tablet 1  . predniSONE (DELTASONE) 10 MG tablet Take 3 tab p.o. x3 days, then take 2 tab x 3 days, then 1 tab x 3 days. 18 tablet 0  . torsemide (DEMADEX) 20 MG tablet Take 1 tablet (20 mg total) by mouth 2 (two) times daily. 90 tablet 3   No current facility-administered medications for this visit.   Allergies:  Demerol, Vasotec [enalapril], and Lasix [furosemide]   Social History: The patient  reports that he quit smoking about 37 years ago. His smoking use included cigarettes. He has a 80.00 pack-year smoking history. He has never used smokeless tobacco. He reports that he does not drink alcohol and does not use drugs.   Family History: The patient's family history includes COPD in his brother,  sister, and sister; Cancer in his mother; Diabetes in his brother, maternal grandmother, mother, and sister; Healthy in his daughter and daughter; Liver disease in his brother.   ROS:  Please see the history of present illness. Otherwise, complete review of systems is positive for none.  All other systems are reviewed and negative.   Physical Exam: VS:  BP 138/68   Pulse 75   Ht 5\' 10"  (1.778 m)   Wt 203 lb 9.6 oz (92.4 kg)   SpO2 97%   BMI 29.21 kg/m , BMI Body mass index is 29.21 kg/m.  Wt Readings from Last 3 Encounters:  05/02/20 203 lb 9.6 oz (92.4 kg)  04/30/20 206 lb 6.4 oz (93.6 kg)  04/29/20 206 lb 12.8 oz (93.8 kg)    General: Patient appears  comfortable at rest. Neck: Supple, no elevated JVP or carotid bruits, no thyromegaly. Lungs: Clear to auscultation, nonlabored breathing at rest. Cardiac: Regular rate and rhythm, no S3, systolic murmur 2/6 best heard at right upper sternal border.  Murmur, no pericardial rub. Extremities: No pitting edema, distal pulses 2+. Skin: Warm and dry. Musculoskeletal: No kyphosis. Neuropsychiatric: Alert and oriented x3, affect grossly appropriate.  ECG:  EKG November 01, 2019 sinus bradycardia nonspecific T wave abnormality, prolonged QT QT/QTc 498/476  Recent Labwork: 11/01/2019: TSH 1.502 12/12/2019: BNP 111.8 04/04/2020: ALT 33; AST 28; BUN 39; Creatinine, Ser 2.59; Potassium 4.4; Sodium 139 04/29/2020: Hemoglobin 10.6; Platelets 110     Component Value Date/Time   CHOL 173 05/04/2018 1056   TRIG 278 (H) 05/04/2018 1056   HDL 34 (L) 05/04/2018 1056   CHOLHDL 5.1 (H) 05/04/2018 1056   CHOLHDL 4.4 02/21/2014 0703   VLDL 38 02/21/2014 0703   LDLCALC 83 05/04/2018 1056    Other Studies Reviewed Today: Cardiac catheterization 05/23/2019:   Normal left main  30 to 40% proximal LAD, first diagonal 50% proximal and 70% distal, and diffuse 70% narrowing in the distal/apical LAD.  First obtuse marginal large in size with proximal  segmental 50% narrowing and distal 90% narrowing.  Dominant RCA with 30 to 40% ostial to proximal narrowing, diffuse disease in the ostial to mid PDA 80 to 90%, and 80% segmental proximal second left ventricular branch.  Previous echo documentation of EF greater than 55%. After hydration today LVEDP 26 mmHg.  RECOMMENDATIONS:   Diffuse, predominantly distal coronary artery disease involving PDA, left ventricular branch, diagonal, distal LAD, and first obtuse marginal. Considering multifocality medical therapy is most appropriate especially in absence of any proximal vessel disease. PCI is possible on the PDA, left ventricular branch, and obtuse marginal but each would require significant contrast and place the patient at risk for acute kidney injury.  Significant elevation in LVEDP and history of orthopnea at home suggest that diastolic heart failure needs therapy with additional diuresis.    Echocardiogram 02/22/2019:  1. The left ventricle has normal systolic function with an ejection  fraction of 60-65%. The cavity size was normal. There is moderately  increased left ventricular wall thickness. Left ventricular diastolic  Doppler parameters are indeterminate.  2. The right ventricle has normal systolic function. The cavity was  normal. There is no increase in right ventricular wall thickness.  3. No evidence of mitral valve stenosis.  4. The aortic valve is tricuspid. Mild thickening of the aortic valve.  Mild calcification of the aortic valve. No stenosis of the aortic valve.  Mild aortic annular calcification noted.  5. The aorta is normal in size and structure.  6. The aortic root is normal in size and structure.  7. Pulmonary hypertension is indeterminate, inadequate TR jet.    Assessment and Plan:  1. CAD in native artery   2. Chronic diastolic heart failure (HCC)   3. Paroxysmal atrial fibrillation ([place])   4. Essential hypertension   5. Stage 3 chronic  kidney disease, unspecified whether stage 3a or 3b CKD ([place])    1. CAD in native artery History of CAD with diffuse predominant distal coronary artery disease involving PDA, left ventricular branch, diagonal, distal LAD and, first obtuse marginal.  Cath report 04/2019 stated PCI was possible on PDA left ventricular branch and OM but each would require significant contrast in place the patient at risk for kidney injury.  Complains of increased dyspnea on exertion.  Could  be an anginal equivalent based on history of diabetes and pre-existing coronary artery disease.  See cardiac catheterization report from last year.  Please get a Lexiscan stress test for ischemic evaluation.  2. Chronic diastolic heart failure ([place])  Echo 02/22/2019 showed EF of 60 to 65%.  Moderately increased LV wall thickness.  Decrease torsemide to 20 mg once daily due to decreasing renal function.  Patient complaining of increasing dyspnea on exertion and has evidence of a murmur at right upper sternal border question of possible aortic valve stenosis or regurgitation.  Please get repeat echocardiogram to reassess LV function, diastolic function, and valvular function.Get BMP 2 weeks  3. Paroxysmal atrial fibrillation (HCC) Current pulse is regular at 75.  Continue amiodarone 200 mg daily.  Continue Eliquis 5 mg p.o. twice daily.  Continue Cardizem 360 mg p.o. daily.  4. Essential hypertension Pressure 138/68 today.  Continue Toprol 25 mg p.o. twice daily.    5. Stage 3 chronic kidney disease, unspecified whether stage 3a or 3b CKD ([place]) Recent creatinine increased at 2.26 at PCPs office.  We are decreasing torsemide to 20 mg once a day.  PCP note states she was going to refer patient to nephrology.  If the referral has not been done we will refer to Dr. Theador Hawthorne.  Medication Adjustments/Labs and Tests Ordered: Current medicines are reviewed at length with the patient today.  Concerns regarding medicines are outlined above.    Disposition: Follow-up with Domenic Polite or APP 6 weeks  Signed, Levell July, NP 05/02/2020 9:00 AM    [facility] [place], [place], [place] [address] Phone: [phone]; Fax: [phone]

## 2020-05-02 ENCOUNTER — Ambulatory Visit: Payer: Medicare HMO | Admitting: Family Medicine

## 2020-05-02 ENCOUNTER — Encounter: Payer: Self-pay | Admitting: *Deleted

## 2020-05-02 ENCOUNTER — Encounter: Payer: Self-pay | Admitting: Family Medicine

## 2020-05-02 VITALS — BP 138/68 | HR 75 | Ht 70.0 in | Wt 203.6 lb

## 2020-05-02 DIAGNOSIS — R011 Cardiac murmur, unspecified: Secondary | ICD-10-CM | POA: Diagnosis not present

## 2020-05-02 DIAGNOSIS — I480 Paroxysmal atrial fibrillation: Secondary | ICD-10-CM

## 2020-05-02 DIAGNOSIS — I10 Essential (primary) hypertension: Secondary | ICD-10-CM

## 2020-05-02 DIAGNOSIS — I208 Other forms of angina pectoris: Secondary | ICD-10-CM

## 2020-05-02 DIAGNOSIS — I5032 Chronic diastolic (congestive) heart failure: Secondary | ICD-10-CM | POA: Diagnosis not present

## 2020-05-02 DIAGNOSIS — I2510 Atherosclerotic heart disease of native coronary artery without angina pectoris: Secondary | ICD-10-CM | POA: Diagnosis not present

## 2020-05-02 DIAGNOSIS — N183 Chronic kidney disease, stage 3 unspecified: Secondary | ICD-10-CM | POA: Diagnosis not present

## 2020-05-02 DIAGNOSIS — R0600 Dyspnea, unspecified: Secondary | ICD-10-CM | POA: Diagnosis not present

## 2020-05-02 DIAGNOSIS — R0609 Other forms of dyspnea: Secondary | ICD-10-CM

## 2020-05-02 MED ORDER — TORSEMIDE 20 MG PO TABS: 20.0000 mg | ORAL_TABLET | Freq: Every day | ORAL | Status: DC

## 2020-05-02 NOTE — Patient Instructions (Addendum)
Medication Instructions:   Decrease Torsemide to daily.  Continue all other medications.    Labwork:  BMET - order given today.   Please do in 2 weeks (around 05/16/20).    Office will contact with results via phone or letter.    Testing/Procedures:  Your physician has requested that you have an echocardiogram. Echocardiography is a painless test that uses sound waves to create images of your heart. It provides your doctor with information about the size and shape of your heart and how well your heart's chambers and valves are working. This procedure takes approximately one hour. There are no restrictions for this procedure.  Your physician has requested that you have a lexiscan myoview. For further information please visit [email or web address]. Please follow instruction sheet, as given.  Office will contact with results via phone or letter.    Follow-Up: 6 weeks  Any Other Special Instructions Will Be Listed Below (If Applicable). You have been referred to:  Nephrology  If you need a refill on your cardiac medications before your next appointment, please call your pharmacy.

## 2020-05-05 DIAGNOSIS — M109 Gout, unspecified: Secondary | ICD-10-CM | POA: Insufficient documentation

## 2020-05-08 ENCOUNTER — Ambulatory Visit (HOSPITAL_COMMUNITY)
Admission: RE | Admit: 2020-05-08 | Discharge: 2020-05-08 | Disposition: A | Discharge status: Home / Self Care | Payer: Medicare HMO | Source: Ambulatory Visit | Attending: Family Medicine | Admitting: Family Medicine

## 2020-05-08 ENCOUNTER — Encounter (HOSPITAL_COMMUNITY)
Admission: RE | Admit: 2020-05-08 | Discharge: 2020-05-08 | Disposition: A | Discharge status: Home / Self Care | Payer: Medicare HMO | Source: Ambulatory Visit | Attending: Family Medicine | Admitting: Family Medicine

## 2020-05-08 ENCOUNTER — Encounter (HOSPITAL_COMMUNITY): Payer: Self-pay

## 2020-05-08 ENCOUNTER — Other Ambulatory Visit: Payer: Self-pay

## 2020-05-08 ENCOUNTER — Encounter (HOSPITAL_BASED_OUTPATIENT_CLINIC_OR_DEPARTMENT_OTHER)
Admission: RE | Admit: 2020-05-08 | Discharge: 2020-05-08 | Disposition: A | Discharge status: Home / Self Care | Payer: Medicare HMO | Source: Ambulatory Visit | Attending: Family Medicine | Admitting: Family Medicine

## 2020-05-08 DIAGNOSIS — R0609 Other forms of dyspnea: Secondary | ICD-10-CM

## 2020-05-08 DIAGNOSIS — R0600 Dyspnea, unspecified: Secondary | ICD-10-CM

## 2020-05-08 DIAGNOSIS — R011 Cardiac murmur, unspecified: Secondary | ICD-10-CM | POA: Diagnosis not present

## 2020-05-08 DIAGNOSIS — I208 Other forms of angina pectoris: Secondary | ICD-10-CM | POA: Insufficient documentation

## 2020-05-08 HISTORY — DX: Disorder of kidney and ureter, unspecified: N28.9

## 2020-05-08 HISTORY — DX: Unspecified asthma, uncomplicated: J45.909

## 2020-05-08 LAB — ECHOCARDIOGRAM COMPLETE
AR max vel: 1.11 cm2
AV Area VTI: 1.13 cm2
AV Area mean vel: 1.14 cm2
AV Mean grad: 21.5 mmHg
AV Peak grad: 37.9 mmHg
Ao pk vel: 3.08 m/s
Area-P 1/2: 2.61 cm2
S' Lateral: 2.99 cm

## 2020-05-08 LAB — NM MYOCAR MULTI W/SPECT W/WALL MOTION / EF
LV dias vol: 110 mL (ref 62–150)
LV sys vol: 47 mL
Peak HR: 65 {beats}/min
RATE: 0.32
Rest HR: 63 {beats}/min
SDS: 2
SRS: 1
SSS: 3
TID: 1.02

## 2020-05-08 MED ORDER — SODIUM CHLORIDE FLUSH 0.9 % IV SOLN
INTRAVENOUS | Status: AC
  Administered 2020-05-08: 10 mL via INTRAVENOUS
  Filled 2020-05-08: qty 10

## 2020-05-08 MED ORDER — TECHNETIUM TC 99M TETROFOSMIN IV KIT
10.0000 | PACK | Freq: Once | INTRAVENOUS | Status: AC | PRN
  Administered 2020-05-08: 10.2 via INTRAVENOUS

## 2020-05-08 MED ORDER — REGADENOSON 0.4 MG/5ML IV SOLN
INTRAVENOUS | Status: AC
  Administered 2020-05-08: 0.4 mg via INTRAVENOUS
  Filled 2020-05-08: qty 5

## 2020-05-08 MED ORDER — TECHNETIUM TC 99M TETROFOSMIN IV KIT
30.0000 | PACK | Freq: Once | INTRAVENOUS | Status: AC | PRN
  Administered 2020-05-08: 32 via INTRAVENOUS

## 2020-05-08 NOTE — Progress Notes (Signed)
Please contact the patient and let him know the stress did not show any areas of lack of blood flow through the coronary arteries. It was considered a low risk study. Thank You

## 2020-05-08 NOTE — Progress Notes (Signed)
*  PRELIMINARY RESULTS* Echocardiogram 2D Echocardiogram has been performed.  Jack Mejia 05/08/2020, 11:47 AM

## 2020-05-13 ENCOUNTER — Inpatient Hospital Stay (HOSPITAL_COMMUNITY): Payer: Medicare HMO

## 2020-05-13 ENCOUNTER — Other Ambulatory Visit: Payer: Self-pay

## 2020-05-13 VITALS — BP 140/62 | HR 62 | Temp 96.9°F | Resp 18 | Wt 204.2 lb

## 2020-05-13 DIAGNOSIS — D508 Other iron deficiency anemias: Secondary | ICD-10-CM

## 2020-05-13 DIAGNOSIS — D469 Myelodysplastic syndrome, unspecified: Secondary | ICD-10-CM | POA: Diagnosis not present

## 2020-05-13 DIAGNOSIS — N183 Chronic kidney disease, stage 3 unspecified: Secondary | ICD-10-CM

## 2020-05-13 DIAGNOSIS — D539 Nutritional anemia, unspecified: Secondary | ICD-10-CM

## 2020-05-13 DIAGNOSIS — N179 Acute kidney failure, unspecified: Secondary | ICD-10-CM

## 2020-05-13 LAB — CBC WITH DIFFERENTIAL/PLATELET
Abs Immature Granulocytes: 0.59 10*3/uL — ABNORMAL HIGH (ref 0.00–0.07)
Basophils Absolute: 0.1 10*3/uL (ref 0.0–0.1)
Basophils Relative: 1 %
Eosinophils Absolute: 0 10*3/uL (ref 0.0–0.5)
Eosinophils Relative: 0 %
HCT: 31.7 % — ABNORMAL LOW (ref 39.0–52.0)
Hemoglobin: 10.6 g/dL — ABNORMAL LOW (ref 13.0–17.0)
Immature Granulocytes: 7 %
Lymphocytes Relative: 16 %
Lymphs Abs: 1.4 10*3/uL (ref 0.7–4.0)
MCH: 37.5 pg — ABNORMAL HIGH (ref 26.0–34.0)
MCHC: 33.4 g/dL (ref 30.0–36.0)
MCV: 112 fL — ABNORMAL HIGH (ref 80.0–100.0)
Monocytes Absolute: 1.9 10*3/uL — ABNORMAL HIGH (ref 0.1–1.0)
Monocytes Relative: 22 %
Neutro Abs: 4.9 10*3/uL (ref 1.7–7.7)
Neutrophils Relative %: 54 %
Platelets: 133 10*3/uL — ABNORMAL LOW (ref 150–400)
RBC: 2.83 MIL/uL — ABNORMAL LOW (ref 4.22–5.81)
RDW: 14.1 % (ref 11.5–15.5)
WBC: 8.8 10*3/uL (ref 4.0–10.5)
nRBC: 0 % (ref 0.0–0.2)

## 2020-05-13 MED ORDER — EPOETIN ALFA-EPBX 40000 UNIT/ML IJ SOLN
40000.0000 [IU] | Freq: Once | INTRAMUSCULAR | Status: AC
  Administered 2020-05-13: 40000 [IU] via SUBCUTANEOUS

## 2020-05-13 MED ORDER — EPOETIN ALFA-EPBX 40000 UNIT/ML IJ SOLN
INTRAMUSCULAR | Status: AC
  Filled 2020-05-13: qty 1

## 2020-05-13 NOTE — Patient Instructions (Signed)
[facility] at [facility]  Discharge Instructions:  Epoetin Alfa injection What is this medicine? EPOETIN ALFA (e POE e tin AL fa) helps your body make more red blood cells. This medicine is used to treat anemia caused by chronic kidney disease, cancer chemotherapy, or HIV-therapy. It may also be used before surgery if you have anemia. This medicine may be used for other purposes; ask your health care provider or pharmacist if you have questions. COMMON BRAND NAME(S): Epogen, Procrit, Retacrit What should I tell my health care provider before I take this medicine? They need to know if you have any of these conditions:  cancer  heart disease  high blood pressure  history of blood clots  history of stroke  low levels of folate, iron, or vitamin B12 in the blood  seizures  an unusual or allergic reaction to erythropoietin, albumin, benzyl alcohol, hamster proteins, other medicines, foods, dyes, or preservatives  pregnant or trying to get pregnant  breast-feeding How should I use this medicine? This medicine is for injection into a vein or under the skin. It is usually given by a health care professional in a hospital or clinic setting. If you get this medicine at home, you will be taught how to prepare and give this medicine. Use exactly as directed. Take your medicine at regular intervals. Do not take your medicine more often than directed. It is important that you put your used needles and syringes in a special sharps container. Do not put them in a trash can. If you do not have a sharps container, call your pharmacist or healthcare provider to get one. A special MedGuide will be given to you by the pharmacist with each prescription and refill. Be sure to read this information carefully each time. Talk to your pediatrician regarding the use of this medicine in children. While this drug may be prescribed for selected conditions, precautions do  apply. Overdosage: If you think you have taken too much of this medicine contact a poison control center or emergency room at once. NOTE: This medicine is only for you. Do not share this medicine with others. What if I miss a dose? If you miss a dose, take it as soon as you can. If it is almost time for your next dose, take only that dose. Do not take double or extra doses. What may interact with this medicine? Interactions have not been studied. This list may not describe all possible interactions. Give your health care provider a list of all the medicines, herbs, non-prescription drugs, or dietary supplements you use. Also tell them if you smoke, drink alcohol, or use illegal drugs. Some items may interact with your medicine. What should I watch for while using this medicine? Your condition will be monitored carefully while you are receiving this medicine. You may need blood work done while you are taking this medicine. This medicine may cause a decrease in vitamin B6. You should make sure that you get enough vitamin B6 while you are taking this medicine. Discuss the foods you eat and the vitamins you take with your health care professional. What side effects may I notice from receiving this medicine? Side effects that you should report to your doctor or health care professional as soon as possible:  allergic reactions like skin rash, itching or hives, swelling of the face, lips, or tongue  seizures  signs and symptoms of a blood clot such as breathing problems; changes in vision; chest pain; severe, sudden   headache; pain, swelling, warmth in the leg; trouble speaking; sudden numbness or weakness of the face, arm or leg  signs and symptoms of a stroke like changes in vision; confusion; trouble speaking or understanding; severe headaches; sudden numbness or weakness of the face, arm or leg; trouble walking; dizziness; loss of balance or coordination Side effects that usually do not require  medical attention (report to your doctor or health care professional if they continue or are bothersome):  chills  cough  dizziness  fever  headaches  joint pain  muscle cramps  muscle pain  nausea, vomiting  pain, redness, or irritation at site where injected This list may not describe all possible side effects. Call your doctor for medical advice about side effects. You may report side effects to FDA at 1-800-FDA-1088. Where should I keep my medicine? Keep out of the reach of children. Store in a refrigerator between 2 and 8 degrees C (36 and 46 degrees F). Do not freeze or shake. Throw away any unused portion if using a single-dose vial. Multi-dose vials can be kept in the refrigerator for up to 21 days after the initial dose. Throw away unused medicine. NOTE: This sheet is a summary. It may not cover all possible information. If you have questions about this medicine, talk to your doctor, pharmacist, or health care provider.  2020 Elsevier/Gold Standard (2017-02-19 08:35:19)  _______________________________________________________________  Thank you for choosing [facility] at [facility] to provide your oncology and hematology care.  To afford each patient quality time with our providers, please arrive at least 15 minutes before your scheduled appointment.  You need to re-schedule your appointment if you arrive 10 or more minutes late.  We strive to give you quality time with our providers, and arriving late affects you and other patients whose appointments are after yours.  Also, if you no show three or more times for appointments you may be dismissed from the clinic.  Again, thank you for choosing [facility] at [facility]. Our hope is that these requests will allow you access to exceptional care and in a timely manner. _______________________________________________________________  If you have questions after your visit, please  contact our office at [phone] between the hours of 8:30 a.m. and 5:00 p.m. Voicemails left after 4:30 p.m. will not be returned until the following business day. _______________________________________________________________  For prescription refill requests, have your pharmacy contact our office. _______________________________________________________________  Recommendations made by the consultant and any test results will be sent to your referring physician. _______________________________________________________________ 

## 2020-05-13 NOTE — Progress Notes (Signed)
Jack Mejia presents today for Retacrit injection. Hemoglobin reviewed prior to administration. VSS. Injection tolerated without incident or complaint. See MAR for details. Patient discharged in satisfactory condition with follow up instructions.

## 2020-05-16 ENCOUNTER — Other Ambulatory Visit (HOSPITAL_COMMUNITY)
Admission: RE | Admit: 2020-05-16 | Discharge: 2020-05-16 | Disposition: A | Discharge status: Home / Self Care | Payer: Medicare HMO | Source: Ambulatory Visit | Attending: Family Medicine | Admitting: Family Medicine

## 2020-05-16 ENCOUNTER — Other Ambulatory Visit: Payer: Self-pay

## 2020-05-16 DIAGNOSIS — N183 Chronic kidney disease, stage 3 unspecified: Secondary | ICD-10-CM | POA: Diagnosis not present

## 2020-05-16 DIAGNOSIS — I10 Essential (primary) hypertension: Secondary | ICD-10-CM | POA: Insufficient documentation

## 2020-05-16 LAB — BASIC METABOLIC PANEL
Anion gap: 10 (ref 5–15)
BUN: 31 mg/dL — ABNORMAL HIGH (ref 8–23)
CO2: 23 mmol/L (ref 22–32)
Calcium: 8.6 mg/dL — ABNORMAL LOW (ref 8.9–10.3)
Chloride: 102 mmol/L (ref 98–111)
Creatinine, Ser: 2.6 mg/dL — ABNORMAL HIGH (ref 0.61–1.24)
GFR, Estimated: 25 mL/min — ABNORMAL LOW (ref >60)
Glucose, Bld: 262 mg/dL — ABNORMAL HIGH (ref 70–99)
Potassium: 3.3 mmol/L — ABNORMAL LOW (ref 3.5–5.1)
Sodium: 135 mmol/L (ref 135–145)

## 2020-05-20 ENCOUNTER — Telehealth: Payer: Self-pay | Admitting: *Deleted

## 2020-05-20 MED ORDER — POTASSIUM CHLORIDE CRYS ER 20 MEQ PO TBCR: EXTENDED_RELEASE_TABLET | ORAL | 0 refills | Status: DC

## 2020-05-20 NOTE — Telephone Encounter (Signed)
STRESS TEST -   Laurine Blazer, LPN  11/91/4782 95:62 PM EDT Back to Top    Friend [name]) notified. Copy to pcp.   +++++++++++++++++++++++++++++++++++++++++++++++++++++++++++++++++++++++  Verta Ellen., NP  05/08/2020 3:35 PM EDT     Please contact the patient and let him know the stress did not show any areas of lack of blood flow through the coronary arteries. It was considered a low risk study. Thank [place], LPN  13/02/6577 46:96 PM EDT Back to Top    Friend [name]) notified. Copy to [place] as he has appointment scheduled 05/27/2020. He will continue diabetes management with his pcp, but was not fasting day of lab draw. Potassium sent to [facility] now.    +++++++++++++++++++++++++++++++++++++++++++++++++++++++++++++++++++++++  Verta Ellen., NP  05/17/2020 4:56 PM EDT     Please call the patient and let him know lab work showed it appears his renal function is worsening. It appears as though his primary care provider has already referred him to nephrology. When you call him just confirm the fact that he has an appointment or referral to nephrology. If not please refer him to Dr. Theador Hawthorne. Also tell him his blood sugar is elevated at 262. He needs to get with his primary provider to manage this better. His potassium is also a little low. Please send in potassium 20 mEq daily for the next 5 days only. Thank you     ECHO -   Laurine Blazer, LPN  29/52/8413 24:40 PM EDT Back to Top    Patient has follow up with Katina Dung, NP on 06/13/2020 & prefers to wait till then to discuss valve clinic referral in further detail at that time.    Laurine Blazer, LPN  05/23/2535 64:40 PM EDT     [place] George [name]) notified. Copy to pcp.    +++++++++++++++++++++++++++++++++++++++++++++++++++++++++++++++++++++++  Verta Ellen., NP  05/08/2020 2:50 PM EDT     Please call the patient and let him  know the pumping function of his heart looks good. His main pumping chamber is a little muscular and stiff. The best way to improve is to manage the blood pressure to keep it at or below 130/80. Has very mild leaking mitral valve. Has some moderate narrowing of the aortic valve. The most common symptoms of a narrowing aortic valve are dyspnea on exertion, feeling faint or fainting, or chest pain. Please let him know we are going to schedule a follow up echocardiogram in 6 months to re-assess. Also go ahead and refer him to the Valve clinic so they can evaluate him. Thanks

## 2020-05-27 ENCOUNTER — Inpatient Hospital Stay (HOSPITAL_COMMUNITY): Payer: Medicare HMO

## 2020-05-27 ENCOUNTER — Encounter (HOSPITAL_COMMUNITY): Payer: Self-pay

## 2020-05-27 ENCOUNTER — Inpatient Hospital Stay (HOSPITAL_COMMUNITY): Payer: Medicare HMO | Attending: Hematology

## 2020-05-27 ENCOUNTER — Other Ambulatory Visit: Payer: Self-pay

## 2020-05-27 VITALS — BP 139/78 | HR 64 | Temp 97.2°F | Resp 20

## 2020-05-27 DIAGNOSIS — D508 Other iron deficiency anemias: Secondary | ICD-10-CM

## 2020-05-27 DIAGNOSIS — N183 Chronic kidney disease, stage 3 unspecified: Secondary | ICD-10-CM

## 2020-05-27 DIAGNOSIS — I129 Hypertensive chronic kidney disease with stage 1 through stage 4 chronic kidney disease, or unspecified chronic kidney disease: Secondary | ICD-10-CM | POA: Insufficient documentation

## 2020-05-27 DIAGNOSIS — D631 Anemia in chronic kidney disease: Secondary | ICD-10-CM | POA: Diagnosis not present

## 2020-05-27 DIAGNOSIS — Z79899 Other long term (current) drug therapy: Secondary | ICD-10-CM | POA: Diagnosis not present

## 2020-05-27 DIAGNOSIS — D539 Nutritional anemia, unspecified: Secondary | ICD-10-CM

## 2020-05-27 DIAGNOSIS — I5032 Chronic diastolic (congestive) heart failure: Secondary | ICD-10-CM | POA: Diagnosis not present

## 2020-05-27 DIAGNOSIS — N179 Acute kidney failure, unspecified: Secondary | ICD-10-CM

## 2020-05-27 DIAGNOSIS — N184 Chronic kidney disease, stage 4 (severe): Secondary | ICD-10-CM | POA: Diagnosis not present

## 2020-05-27 LAB — CBC WITH DIFFERENTIAL/PLATELET
Abs Immature Granulocytes: 0.4 10*3/uL — ABNORMAL HIGH (ref 0.00–0.07)
Basophils Absolute: 0 10*3/uL (ref 0.0–0.1)
Basophils Relative: 0 %
Eosinophils Absolute: 0 10*3/uL (ref 0.0–0.5)
Eosinophils Relative: 0 %
HCT: 31.4 % — ABNORMAL LOW (ref 39.0–52.0)
Hemoglobin: 10.1 g/dL — ABNORMAL LOW (ref 13.0–17.0)
Immature Granulocytes: 5 %
Lymphocytes Relative: 16 %
Lymphs Abs: 1.4 10*3/uL (ref 0.7–4.0)
MCH: 36.6 pg — ABNORMAL HIGH (ref 26.0–34.0)
MCHC: 32.2 g/dL (ref 30.0–36.0)
MCV: 113.8 fL — ABNORMAL HIGH (ref 80.0–100.0)
Monocytes Absolute: 1.9 10*3/uL — ABNORMAL HIGH (ref 0.1–1.0)
Monocytes Relative: 23 %
Neutro Abs: 4.7 10*3/uL (ref 1.7–7.7)
Neutrophils Relative %: 56 %
Platelets: 144 10*3/uL — ABNORMAL LOW (ref 150–400)
RBC: 2.76 MIL/uL — ABNORMAL LOW (ref 4.22–5.81)
RDW: 15.9 % — ABNORMAL HIGH (ref 11.5–15.5)
WBC: 8.4 10*3/uL (ref 4.0–10.5)
nRBC: 0 % (ref 0.0–0.2)

## 2020-05-27 MED ORDER — EPOETIN ALFA-EPBX 40000 UNIT/ML IJ SOLN
40000.0000 [IU] | Freq: Once | INTRAMUSCULAR | Status: AC
  Administered 2020-05-27: 40000 [IU] via SUBCUTANEOUS
  Filled 2020-05-27: qty 1

## 2020-05-27 NOTE — Patient Instructions (Signed)
[facility] Discharge Instructions  Received Retacrit injection today. Follow-up as scheduled   Thank you for choosing  [facility] to provide your oncology and hematology care.  To afford each patient quality time with our provider, please arrive at least 15 minutes before your scheduled appointment time.   If you have a lab appointment with the Cancer Center please come in thru the Main Entrance and check in at the main information desk.  You need to re-schedule your appointment should you arrive 10 or more minutes late.  We strive to give you quality time with our providers, and arriving late affects you and other patients whose appointments are after yours.  Also, if you no show three or more times for appointments you may be dismissed from the clinic at the providers discretion.     Again, thank you for choosing [facility].  Our hope is that these requests will decrease the amount of time that you wait before being seen by our physicians.       _____________________________________________________________  Should you have questions after your visit to [facility], please contact our office at [phone] and follow the prompts.  Our office hours are 8:00 a.m. and 4:30 p.m. Monday - Friday.  Please note that voicemails left after 4:00 p.m. may not be returned until the following business day.  We are closed weekends and major holidays.  You do have access to a nurse 24-7, just call the main number to the clinic [phone] and do not press any options, hold on the line and a nurse will answer the phone.    For prescription refill requests, have your pharmacy contact our office and allow 72 hours.    Due to Covid, you will need to wear a mask upon entering the hospital. If you do not have a mask, a mask will be given to you at the Main Entrance upon arrival. For doctor visits, patients may  have 1 support person age 18 or older with them. For treatment visits, patients can not have anyone with them due to social distancing guidelines and our immunocompromised population.     

## 2020-05-27 NOTE — Progress Notes (Signed)
1203 Hgb 10.1 today so pt to receive Retacrit per parameters      Jack Mejia L Care tolerated Retacrit injection well without complaints or incident. VSS Pt discharged self ambulatory using his cane in satisfactory condition

## 2020-05-31 ENCOUNTER — Other Ambulatory Visit: Payer: Self-pay

## 2020-05-31 ENCOUNTER — Encounter: Payer: Self-pay | Admitting: Family Medicine

## 2020-05-31 ENCOUNTER — Ambulatory Visit (INDEPENDENT_AMBULATORY_CARE_PROVIDER_SITE_OTHER): Payer: Medicare HMO | Admitting: Family Medicine

## 2020-05-31 ENCOUNTER — Other Ambulatory Visit: Payer: Self-pay | Admitting: *Deleted

## 2020-05-31 VITALS — BP 138/80 | HR 73 | Temp 97.6°F | Ht 70.0 in | Wt 210.0 lb

## 2020-05-31 DIAGNOSIS — M109 Gout, unspecified: Secondary | ICD-10-CM | POA: Diagnosis not present

## 2020-05-31 MED ORDER — TORSEMIDE 20 MG PO TABS: 20.0000 mg | ORAL_TABLET | Freq: Every day | ORAL | 11 refills | Status: DC

## 2020-05-31 MED ORDER — PREDNISONE 20 MG PO TABS: ORAL_TABLET | ORAL | 0 refills | Status: DC

## 2020-05-31 NOTE — Progress Notes (Signed)
Patient ID: Jack Mejia, male    DOB: 05/31/1946, 74 y.o.   MRN: [id number]   Chief Complaint  Patient presents with  . Joint Swelling   Subjective:  CC: right knee pain and swelling  Presents today with right knee pain and swelling that started last Friday.  In July 2021 had a gout flareup, he was prescribed prednisone at that time, he only takes the prednisone until the pain is relieved, he had some leftover prednisone and took it for this pain for 3 days.  Reports that the pain and swelling was better while he was on the prednisone when he stopped it came back.  Tried ice 1 time, and then some over-the-counter "arthritis pills "does not know the name of those tablets.  Pertinent negatives include no fever, no chills, no abdominal pain, no urinary symptoms.  He does have shortness of breath, this is not a new problem he has been worked up by multiple specialist per patient. right knee swelling and pain for one week. No injury. Took some "left over" prednisone pills because he thought it may be gout.    Medical History [name] has a past medical history of Arthritis, Asthma, Atrial fibrillation ([place]), CAD (coronary artery disease) (06/29/2019), Cramps of left lower extremity, Depression, Diabetes mellitus, Dyspnea, Elevated liver enzymes, Fatty liver, History of kidney stones, Hyperlipidemia, Hypertension, Renal insufficiency, and Vertigo.   Outpatient Encounter Medications as of 05/31/2020  Medication Sig  . amiodarone (PACERONE) 200 MG tablet TAKE 1 TABLET BY MOUTH EVERY DAY  . apixaban (ELIQUIS) 5 MG TABS tablet Take 1 tablet (5 mg total) by mouth 2 (two) times daily.  [name] atorvastatin (LIPITOR) 20 MG tablet Take 1 tablet (20 mg total) by mouth daily.  [name] diltiazem (CARDIZEM CD) 360 MG 24 hr capsule Take 1 capsule (360 mg total) by mouth daily.  . empagliflozin (JARDIANCE) 10 MG TABS tablet Take 1 tablet (10 mg total) by mouth daily before breakfast.  . glipiZIDE (GLUCOTROL) 5 MG tablet  Take 2 tablets (10 mg total) by mouth 2 (two) times daily.  . metoprolol tartrate (LOPRESSOR) 25 MG tablet Take 1 tablet (25 mg total) by mouth 2 (two) times daily.  . predniSONE (DELTASONE) 20 MG tablet Take 3 tablets for 3 days, then 2 tablets for 3 days, then one tablet for 3 days by mouth  . torsemide (DEMADEX) 20 MG tablet Take 1 tablet (20 mg total) by mouth daily.  . [DISCONTINUED] potassium chloride SA (KLOR-CON) 20 MEQ tablet Take one tab by mouth for the next 5 days only, then only as directed by physician.  . [DISCONTINUED] predniSONE (DELTASONE) 10 MG tablet Take 3 tab p.o. x3 days, then take 2 tab x 3 days, then 1 tab x 3 days.   No facility-administered encounter medications on file as of 05/31/2020.     Review of Systems  Constitutional: Negative for chills and fever.  Respiratory: Positive for shortness of breath.        Not a new problem, has been worked up by multiple specialist.  Cardiovascular: Negative for chest pain.  Gastrointestinal: Negative for abdominal pain.  Musculoskeletal: Positive for gait problem and joint swelling.       Right knee pain and swelling.  Reports warmth.  Uses a cane for mobility.     Vitals BP 138/80   Pulse 73   Temp 97.6 F (36.4 C)   Ht 5\' 10"  (1.778 m)   Wt 210 lb (95.3 kg)   SpO2  98%   BMI 30.13 kg/m   Objective:   Physical Exam Vitals and nursing note reviewed.  Constitutional:      General: He is not in acute distress.    Appearance: Normal appearance. He is not ill-appearing.  Cardiovascular:     Rate and Rhythm: Normal rate and regular rhythm.     Heart sounds: Normal heart sounds.  Pulmonary:     Effort: Pulmonary effort is normal.     Breath sounds: Normal breath sounds.     Comments: Slightly labored respiratory effort.  New problem.  Oxygen saturations 98%. Musculoskeletal:     Right knee: Swelling present. No erythema or ecchymosis. Tenderness present over the lateral joint line and patellar tendon.      Comments: Denies injury.  Reports it feels very similar to the gout flare he had in July.  Neurological:     Mental Status: He is alert and oriented to person, place, and time.  Psychiatric:        Behavior: Behavior normal.        Thought Content: Thought content normal.        Judgment: Judgment normal.      Assessment and Plan   1. Acute gout of right knee, unspecified cause - predniSONE (DELTASONE) 20 MG tablet; Take 3 tablets for 3 days, then 2 tablets for 3 days, then one tablet for 3 days by mouth  Dispense: 18 tablet; Refill: 0   Offered uric acid level today, declined.  States he just had labs drawn on Monday, and showed me the bruises in his antecubital.  Reports that the prednisone that he had leftover helped his symptoms, symptoms returned when he stopped.  Will initiate prednisone taper.  Agrees with plan of care discussed today. Understands warning signs to seek further care: fever, chills, redness in joints, any significant changes in health status. Understands to follow-up if symptoms do not improve.  Encouraged him to take the full 9-day taper, and not to save pills for the next flare.  He understands.  If he continues to have flares, consider prevention therapy.  If his symptoms do not improve, consider orthopedic referral.  Pecolia Ades, FNP-C 05/31/2020

## 2020-05-31 NOTE — Patient Instructions (Signed)
 Gout  Gout is painful swelling of your joints. Gout is a type of arthritis. It is caused by having too much uric acid in your body. Uric acid is a chemical that is made when your body breaks down substances called purines. If your body has too much uric acid, sharp crystals can form and build up in your joints. This causes pain and swelling. Gout attacks can happen quickly and be very painful (acute gout). Over time, the attacks can affect more joints and happen more often (chronic gout). What are the causes?  Too much uric acid in your blood. This can happen because: ? Your kidneys do not remove enough uric acid from your blood. ? Your body makes too much uric acid. ? You eat too many foods that are high in purines. These foods include organ meats, some seafood, and beer.  Trauma or stress. What increases the risk?  Having a family history of gout.  Being male and middle-aged.  Being male and having gone through menopause.  Being very overweight (obese).  Drinking alcohol, especially beer.  Not having enough water in the body (being dehydrated).  Losing weight too quickly.  Having an organ transplant.  Having lead poisoning.  Taking certain medicines.  Having kidney disease.  Having a skin condition called psoriasis. What are the signs or symptoms? An attack of acute gout usually happens in just one joint. The most common place is the big toe. Attacks often start at night. Other joints that may be affected include joints of the feet, ankle, knee, fingers, wrist, or elbow. Symptoms of an attack may include:  Very bad pain.  Warmth.  Swelling.  Stiffness.  Shiny, red, or purple skin.  Tenderness. The affected joint may be very painful to touch.  Chills and fever. Chronic gout may cause symptoms more often. More joints may be involved. You may also have white or yellow lumps (tophi) on your hands or feet or in other areas near your joints. How is this  treated?  Treatment for this condition has two phases: treating an acute attack and preventing future attacks.  Acute gout treatment may include: ? NSAIDs. ? Steroids. These are taken by mouth or injected into a joint. ? Colchicine. This medicine relieves pain and swelling. It can be given by mouth or through an IV tube.  Preventive treatment may include: ? Taking small doses of NSAIDs or colchicine daily. ? Using a medicine that reduces uric acid levels in your blood. ? Making changes to your diet. You may need to see a food expert (dietitian) about what to eat and drink to prevent gout. Follow these instructions at home: During a gout attack   If told, put ice on the painful area: ? Put ice in a plastic bag. ? Place a towel between your skin and the bag. ? Leave the ice on for 20 minutes, 2-3 times a day.  Raise (elevate) the painful joint above the level of your heart as often as you can.  Rest the joint as much as possible. If the joint is in your leg, you may be given crutches.  Follow instructions from your doctor about what you cannot eat or drink. Avoiding future gout attacks  Eat a low-purine diet. Avoid foods and drinks such as: ? Liver. ? Kidney. ? Anchovies. ? Asparagus. ? Herring. ? Mushrooms. ? Mussels. ? Beer.  Stay at a healthy weight. If you want to lose weight, talk with your doctor. Do not lose   weight too fast.  Start or continue an exercise plan as told by your doctor. Eating and drinking  Drink enough fluids to keep your pee (urine) pale yellow.  If you drink alcohol: ? Limit how much you use to:  0-1 drink a day for women.  0-2 drinks a day for men. ? Be aware of how much alcohol is in your drink. In the [place] one drink equals one 12 oz bottle of beer (355 mL), one 5 oz glass of wine (148 mL), or one 1 oz glass of hard liquor (44 mL). General instructions  Take over-the-counter and prescription medicines only as told by your doctor.  Do  not drive or use heavy machinery while taking prescription pain medicine.  Return to your normal activities as told by your doctor. Ask your doctor what activities are safe for you.  Keep all follow-up visits as told by your doctor. This is important. Contact a doctor if:  You have another gout attack.  You still have symptoms of a gout attack after 10 days of treatment.  You have problems (side effects) because of your medicines.  You have chills or a fever.  You have burning pain when you pee (urinate).  You have pain in your lower back or belly. Get help right away if:  You have very bad pain.  Your pain cannot be controlled.  You cannot pee. Summary  Gout is painful swelling of the joints.  The most common site of pain is the big toe, but it can affect other joints.  Medicines and avoiding some foods can help to prevent and treat gout attacks. This information is not intended to replace advice given to you by your health care provider. Make sure you discuss any questions you have with your health care provider. Document Revised: 02/02/2018 Document Reviewed: 02/02/2018 Elsevier Patient Education  2020 [employer].  

## 2020-06-06 ENCOUNTER — Observation Stay (HOSPITAL_COMMUNITY)
Admission: EM | Admit: 2020-06-06 | Discharge: 2020-06-07 | Disposition: A | Discharge status: Home / Self Care | Payer: Medicare HMO | Attending: Internal Medicine | Admitting: Internal Medicine

## 2020-06-06 ENCOUNTER — Encounter (HOSPITAL_COMMUNITY): Payer: Self-pay | Admitting: *Deleted

## 2020-06-06 ENCOUNTER — Emergency Department (HOSPITAL_COMMUNITY): Payer: Medicare HMO

## 2020-06-06 ENCOUNTER — Other Ambulatory Visit: Payer: Self-pay

## 2020-06-06 DIAGNOSIS — I10 Essential (primary) hypertension: Secondary | ICD-10-CM

## 2020-06-06 DIAGNOSIS — M48062 Spinal stenosis, lumbar region with neurogenic claudication: Secondary | ICD-10-CM | POA: Diagnosis not present

## 2020-06-06 DIAGNOSIS — W1830XA Fall on same level, unspecified, initial encounter: Secondary | ICD-10-CM | POA: Diagnosis not present

## 2020-06-06 DIAGNOSIS — E1165 Type 2 diabetes mellitus with hyperglycemia: Secondary | ICD-10-CM | POA: Diagnosis not present

## 2020-06-06 DIAGNOSIS — I110 Hypertensive heart disease with heart failure: Secondary | ICD-10-CM | POA: Diagnosis not present

## 2020-06-06 DIAGNOSIS — I4891 Unspecified atrial fibrillation: Secondary | ICD-10-CM | POA: Diagnosis not present

## 2020-06-06 DIAGNOSIS — Z7901 Long term (current) use of anticoagulants: Secondary | ICD-10-CM | POA: Insufficient documentation

## 2020-06-06 DIAGNOSIS — N183 Chronic kidney disease, stage 3 unspecified: Secondary | ICD-10-CM | POA: Diagnosis present

## 2020-06-06 DIAGNOSIS — N1832 Chronic kidney disease, stage 3b: Secondary | ICD-10-CM | POA: Diagnosis not present

## 2020-06-06 DIAGNOSIS — S79912A Unspecified injury of left hip, initial encounter: Secondary | ICD-10-CM | POA: Diagnosis present

## 2020-06-06 DIAGNOSIS — S7002XA Contusion of left hip, initial encounter: Principal | ICD-10-CM

## 2020-06-06 DIAGNOSIS — R531 Weakness: Secondary | ICD-10-CM

## 2020-06-06 DIAGNOSIS — I5033 Acute on chronic diastolic (congestive) heart failure: Secondary | ICD-10-CM | POA: Diagnosis not present

## 2020-06-06 DIAGNOSIS — Z87891 Personal history of nicotine dependence: Secondary | ICD-10-CM | POA: Insufficient documentation

## 2020-06-06 DIAGNOSIS — E1100 Type 2 diabetes mellitus with hyperosmolarity without nonketotic hyperglycemic-hyperosmolar coma (NKHHC): Secondary | ICD-10-CM | POA: Diagnosis not present

## 2020-06-06 DIAGNOSIS — Z7984 Long term (current) use of oral hypoglycemic drugs: Secondary | ICD-10-CM | POA: Insufficient documentation

## 2020-06-06 DIAGNOSIS — J45909 Unspecified asthma, uncomplicated: Secondary | ICD-10-CM | POA: Diagnosis not present

## 2020-06-06 DIAGNOSIS — E785 Hyperlipidemia, unspecified: Secondary | ICD-10-CM | POA: Diagnosis not present

## 2020-06-06 DIAGNOSIS — Z79899 Other long term (current) drug therapy: Secondary | ICD-10-CM | POA: Insufficient documentation

## 2020-06-06 DIAGNOSIS — M25522 Pain in left elbow: Secondary | ICD-10-CM | POA: Diagnosis not present

## 2020-06-06 DIAGNOSIS — Z20822 Contact with and (suspected) exposure to covid-19: Secondary | ICD-10-CM | POA: Insufficient documentation

## 2020-06-06 DIAGNOSIS — I480 Paroxysmal atrial fibrillation: Secondary | ICD-10-CM

## 2020-06-06 DIAGNOSIS — M25552 Pain in left hip: Secondary | ICD-10-CM | POA: Diagnosis not present

## 2020-06-06 DIAGNOSIS — E1121 Type 2 diabetes mellitus with diabetic nephropathy: Secondary | ICD-10-CM

## 2020-06-06 DIAGNOSIS — W19XXXA Unspecified fall, initial encounter: Secondary | ICD-10-CM | POA: Diagnosis not present

## 2020-06-06 DIAGNOSIS — I2510 Atherosclerotic heart disease of native coronary artery without angina pectoris: Secondary | ICD-10-CM | POA: Diagnosis not present

## 2020-06-06 DIAGNOSIS — E119 Type 2 diabetes mellitus without complications: Secondary | ICD-10-CM | POA: Diagnosis not present

## 2020-06-06 LAB — BLOOD GAS, VENOUS
Acid-base deficit: 1.8 mmol/L (ref 0.0–2.0)
Bicarbonate: 22.3 mmol/L (ref 20.0–28.0)
FIO2: 21
O2 Saturation: 54.1 %
Patient temperature: 36.5
pCO2, Ven: 38.7 mmHg — ABNORMAL LOW (ref 44.0–60.0)
pH, Ven: 7.383 (ref 7.250–7.430)
pO2, Ven: 33.4 mmHg (ref 32.0–45.0)

## 2020-06-06 LAB — CBC WITH DIFFERENTIAL/PLATELET
Basophils Absolute: 0 10*3/uL (ref 0.0–0.1)
Basophils Relative: 0 %
Eosinophils Absolute: 0 10*3/uL (ref 0.0–0.5)
Eosinophils Relative: 0 %
HCT: 28 % — ABNORMAL LOW (ref 39.0–52.0)
Hemoglobin: 9 g/dL — ABNORMAL LOW (ref 13.0–17.0)
Lymphocytes Relative: 5 %
Lymphs Abs: 1.6 10*3/uL (ref 0.7–4.0)
MCH: 37.5 pg — ABNORMAL HIGH (ref 26.0–34.0)
MCHC: 32.1 g/dL (ref 30.0–36.0)
MCV: 116.7 fL — ABNORMAL HIGH (ref 80.0–100.0)
Metamyelocytes Relative: 4 %
Monocytes Absolute: 3.9 10*3/uL — ABNORMAL HIGH (ref 0.1–1.0)
Monocytes Relative: 12 %
Myelocytes: 6 %
Neutro Abs: 23.3 10*3/uL — ABNORMAL HIGH (ref 1.7–7.7)
Neutrophils Relative %: 71 %
Platelets: 125 10*3/uL — ABNORMAL LOW (ref 150–400)
Promyelocytes Relative: 2 %
RBC: 2.4 MIL/uL — ABNORMAL LOW (ref 4.22–5.81)
RDW: 16.9 % — ABNORMAL HIGH (ref 11.5–15.5)
WBC: 32.8 10*3/uL — ABNORMAL HIGH (ref 4.0–10.5)
nRBC: 0.1 % (ref 0.0–0.2)

## 2020-06-06 LAB — CBG MONITORING, ED
Glucose-Capillary: >600 mg/dL (ref 70–99)
Glucose-Capillary: >600 mg/dL (ref 70–99)
Glucose-Capillary: 513 mg/dL (ref 70–99)
Glucose-Capillary: 574 mg/dL (ref 70–99)
Glucose-Capillary: 419 mg/dL — ABNORMAL HIGH (ref 70–99)
Glucose-Capillary: 353 mg/dL — ABNORMAL HIGH (ref 70–99)
Glucose-Capillary: 278 mg/dL — ABNORMAL HIGH (ref 70–99)
Glucose-Capillary: 230 mg/dL — ABNORMAL HIGH (ref 70–99)

## 2020-06-06 LAB — URINALYSIS, ROUTINE W REFLEX MICROSCOPIC
Bacteria, UA: NONE SEEN
Bilirubin Urine: NEGATIVE
Glucose, UA: >=500 mg/dL — AB
Ketones, ur: NEGATIVE mg/dL
Leukocytes,Ua: NEGATIVE
Nitrite: NEGATIVE
Protein, ur: NEGATIVE mg/dL
Specific Gravity, Urine: 1.013 (ref 1.005–1.030)
pH: 5 (ref 5.0–8.0)

## 2020-06-06 LAB — BASIC METABOLIC PANEL
Anion gap: 15 (ref 5–15)
BUN: 55 mg/dL — ABNORMAL HIGH (ref 8–23)
CO2: 19 mmol/L — ABNORMAL LOW (ref 22–32)
Calcium: 8.3 mg/dL — ABNORMAL LOW (ref 8.9–10.3)
Chloride: 88 mmol/L — ABNORMAL LOW (ref 98–111)
Creatinine, Ser: 2.59 mg/dL — ABNORMAL HIGH (ref 0.61–1.24)
GFR, Estimated: 25 mL/min — ABNORMAL LOW (ref >60)
Glucose, Bld: 846 mg/dL (ref 70–99)
Potassium: 4.4 mmol/L (ref 3.5–5.1)
Sodium: 122 mmol/L — ABNORMAL LOW (ref 135–145)

## 2020-06-06 LAB — GLUCOSE, CAPILLARY
Glucose-Capillary: 140 mg/dL — ABNORMAL HIGH (ref 70–99)
Glucose-Capillary: 165 mg/dL — ABNORMAL HIGH (ref 70–99)
Glucose-Capillary: 198 mg/dL — ABNORMAL HIGH (ref 70–99)

## 2020-06-06 LAB — RESPIRATORY PANEL BY RT PCR (FLU A&B, COVID)
Influenza A by PCR: NEGATIVE
Influenza B by PCR: NEGATIVE
SARS Coronavirus 2 by RT PCR: NEGATIVE

## 2020-06-06 LAB — PHOSPHORUS: Phosphorus: 4.7 mg/dL — ABNORMAL HIGH (ref 2.5–4.6)

## 2020-06-06 LAB — TSH: TSH: 0.49 u[IU]/mL (ref 0.350–4.500)

## 2020-06-06 LAB — VITAMIN B12: Vitamin B-12: 799 pg/mL (ref 180–914)

## 2020-06-06 LAB — MAGNESIUM: Magnesium: 2.7 mg/dL — ABNORMAL HIGH (ref 1.7–2.4)

## 2020-06-06 MED ORDER — ONDANSETRON HCL 4 MG PO TABS: 4.0000 mg | ORAL_TABLET | Freq: Four times a day (QID) | ORAL | Status: DC | PRN

## 2020-06-06 MED ORDER — METOPROLOL TARTRATE 25 MG PO TABS
25.0000 mg | ORAL_TABLET | Freq: Two times a day (BID) | ORAL | Status: DC
  Administered 2020-06-06 – 2020-06-07 (×2): 25 mg via ORAL
  Filled 2020-06-06 (×2): qty 1

## 2020-06-06 MED ORDER — HEPARIN SODIUM (PORCINE) 5000 UNIT/ML IJ SOLN
5000.0000 [IU] | Freq: Three times a day (TID) | INTRAMUSCULAR | Status: DC
  Administered 2020-06-06: 5000 [IU] via SUBCUTANEOUS
  Filled 2020-06-06: qty 1

## 2020-06-06 MED ORDER — LACTATED RINGERS IV BOLUS: 1000.0000 mL | INTRAVENOUS | Status: DC

## 2020-06-06 MED ORDER — AMIODARONE HCL 200 MG PO TABS
200.0000 mg | ORAL_TABLET | Freq: Every day | ORAL | Status: DC
  Administered 2020-06-07: 200 mg via ORAL
  Filled 2020-06-06: qty 1

## 2020-06-06 MED ORDER — DEXTROSE IN LACTATED RINGERS 5 % IV SOLN: INTRAVENOUS | Status: DC

## 2020-06-06 MED ORDER — ACETAMINOPHEN 500 MG PO TABS
1000.0000 mg | ORAL_TABLET | Freq: Once | ORAL | Status: AC
  Administered 2020-06-06: 1000 mg via ORAL
  Filled 2020-06-06: qty 2

## 2020-06-06 MED ORDER — SODIUM CHLORIDE 0.9 % IV SOLN: INTRAVENOUS | Status: DC

## 2020-06-06 MED ORDER — ATORVASTATIN CALCIUM 20 MG PO TABS
20.0000 mg | ORAL_TABLET | Freq: Every day | ORAL | Status: DC
  Administered 2020-06-07: 20 mg via ORAL
  Filled 2020-06-06: qty 1

## 2020-06-06 MED ORDER — DEXTROSE 50 % IV SOLN: 0.0000 mL | INTRAVENOUS | Status: DC | PRN

## 2020-06-06 MED ORDER — HYDROCODONE-ACETAMINOPHEN 5-325 MG PO TABS
1.0000 | ORAL_TABLET | Freq: Four times a day (QID) | ORAL | Status: DC | PRN
  Administered 2020-06-06 – 2020-06-07 (×4): 1 via ORAL
  Filled 2020-06-06 (×4): qty 1

## 2020-06-06 MED ORDER — LACTATED RINGERS IV SOLN: INTRAVENOUS | Status: DC

## 2020-06-06 MED ORDER — APIXABAN 5 MG PO TABS
5.0000 mg | ORAL_TABLET | Freq: Two times a day (BID) | ORAL | Status: DC
  Administered 2020-06-06 – 2020-06-07 (×2): 5 mg via ORAL
  Filled 2020-06-06 (×2): qty 1

## 2020-06-06 MED ORDER — DILTIAZEM HCL ER COATED BEADS 180 MG PO CP24
360.0000 mg | ORAL_CAPSULE | Freq: Every day | ORAL | Status: DC
  Administered 2020-06-07: 360 mg via ORAL
  Filled 2020-06-06: qty 2
  Filled 2020-06-06: qty 1

## 2020-06-06 MED ORDER — ONDANSETRON HCL 4 MG/2ML IJ SOLN: 4.0000 mg | Freq: Four times a day (QID) | INTRAMUSCULAR | Status: DC | PRN

## 2020-06-06 MED ORDER — INSULIN REGULAR(HUMAN) IN NACL 100-0.9 UT/100ML-% IV SOLN
INTRAVENOUS | Status: DC
  Administered 2020-06-06: 11.5 [IU]/h via INTRAVENOUS
  Filled 2020-06-06: qty 100

## 2020-06-06 MED ORDER — POTASSIUM CHLORIDE 10 MEQ/100ML IV SOLN
10.0000 meq | INTRAVENOUS | Status: AC
  Administered 2020-06-06 (×2): 10 meq via INTRAVENOUS
  Filled 2020-06-06 (×2): qty 100

## 2020-06-06 NOTE — ED Provider Notes (Signed)
[facility] EMERGENCY DEPARTMENT Provider Note   CSN: [id number] Arrival date & time: 06/06/20  1046     History Chief Complaint  Patient presents with  . Fall  . Hip Pain    Jack Mejia is a 74 y.o. male.  Patient with history of atrial fibrillation, asthma, arthritis, coronary artery disease, diabetes on oral medication, renal insufficiency presents with general weakness and multiple falls.  Patient last fell on Monday hitting his left hip and left elbow has had pain with walking and movement since.  Patient denies any head injury, fevers or other infectious symptoms.  Patient's been urinating more frequently recently.  Patient is on anticoagulant for his atrial fibrillation.        Past Medical History:  Diagnosis Date  . Arthritis   . Asthma   . Atrial fibrillation ([place])    a. s/p DCCV in 03/2019  . CAD (coronary artery disease) 06/29/2019  . Cramps of left lower extremity   . Depression   . Diabetes mellitus    type 2 for 7-8 yrs  . Dyspnea   . Elevated liver enzymes   . Fatty liver   . History of kidney stones   . Hyperlipidemia   . Hypertension   . Renal insufficiency   . Vertigo     Patient Active Problem List   Diagnosis Date Noted  . Acute gout of right knee 05/05/2020  . Anemia, iron deficiency 01/01/2020  . Macrocytic anemia 12/14/2019  . Coronary artery disease involving native coronary artery of native heart without angina pectoris   . Acute-on-chronic kidney injury ([place]) 04/21/2019  . Acute on chronic diastolic (congestive) heart failure ([place]) 04/15/2019  . Atrial fibrillation with rapid ventricular response ([place]) 02/21/2019  . Depression, major, single episode, mild ([place]) 03/01/2017  . Lumbar stenosis with neurogenic claudication 08/06/2016  . Erectile dysfunction 12/05/2012  . Sensory neuropathy 12/05/2012  . Hyperlipidemia LDL goal <100 12/05/2012  . Rectal bleeding 10/13/2012  . Hypertension 07/07/2011  . Diabetes mellitus ([place])  07/07/2011  . Fatty liver 07/07/2011    Past Surgical History:  Procedure Laterality Date  . CARDIOVERSION N/A 04/21/2019   Procedure: CARDIOVERSION;  Surgeon: Sanda Klein, MD;  Location: [facility] ENDOSCOPY;  Service: Cardiovascular;  Laterality: N/A;  . CARPAL TUNNEL RELEASE     both wrist  . cataract surgery     bilateral  . CHOLECYSTECTOMY    . EYE SURGERY    . HEMORROIDECTOMY    . KNEE SURGERY Left   . LEFT HEART CATH AND CORONARY ANGIOGRAPHY N/A 05/23/2019   Procedure: LEFT HEART CATH AND CORONARY ANGIOGRAPHY;  Surgeon: Belva Crome, MD;  Location: [facility];  Service: Cardiovascular;  Laterality: N/A;  . LUMBAR LAMINECTOMY/DECOMPRESSION MICRODISCECTOMY N/A 08/06/2016   Procedure: LUMBAR THREE- LUMBAR FIVE  DECOMPRESSIVE LUMBAR LAMINECTOMY;  Surgeon: Jovita Gamma, MD;  Location: [facility];  Service: Neurosurgery;  Laterality: N/A;       Family History  Problem Relation Age of Onset  . Diabetes Mother   . Cancer Mother        unknown kind  . Diabetes Sister   . COPD Sister   . Liver disease Brother   . COPD Brother   . Diabetes Maternal Grandmother   . Diabetes Brother   . COPD Sister   . Healthy Daughter   . Healthy Daughter     Social History   Tobacco Use  . Smoking status: Former Smoker    Packs/day: 4.00  Years: 20.00    Pack years: 80.00    Types: Cigarettes    Quit date: 10/14/1982    Years since quitting: 37.6  . Smokeless tobacco: Never Used  . Tobacco comment: 29 yrs ago  Vaping Use  . Vaping Use: Never used  Substance Use Topics  . Alcohol use: No  . Drug use: No    Home Medications Prior to Admission medications   Medication Sig Start Date End Date Taking? Authorizing Provider  amiodarone (PACERONE) 200 MG tablet TAKE 1 TABLET BY MOUTH EVERY DAY 12/05/19   Herminio Commons, MD  apixaban (ELIQUIS) 5 MG TABS tablet Take 1 tablet (5 mg total) by mouth 2 (two) times daily. 10/03/19   Herminio Commons, MD  atorvastatin (LIPITOR)  20 MG tablet Take 1 tablet (20 mg total) by mouth daily. 10/03/19 09/27/20  Herminio Commons, MD  diltiazem (CARDIZEM CD) 360 MG 24 hr capsule Take 1 capsule (360 mg total) by mouth daily. 04/22/20   Strader, Fransisco Hertz, PA-C  empagliflozin (JARDIANCE) 10 MG TABS tablet Take 1 tablet (10 mg total) by mouth daily before breakfast. 04/23/20   Lovena Le, Malena M, DO  glipiZIDE (GLUCOTROL) 5 MG tablet Take 2 tablets (10 mg total) by mouth 2 (two) times daily. 04/22/20   Elvia Collum M, DO  metoprolol tartrate (LOPRESSOR) 25 MG tablet Take 1 tablet (25 mg total) by mouth 2 (two) times daily. 04/30/20   Lovena Le, Malena M, DO  predniSONE (DELTASONE) 20 MG tablet Take 3 tablets for 3 days, then 2 tablets for 3 days, then one tablet for 3 days by mouth 05/31/20   Chalmers Guest, NP  torsemide (DEMADEX) 20 MG tablet Take 1 tablet (20 mg total) by mouth daily. 05/31/20 05/26/21  Verta Ellen., NP    Allergies    Demerol, Vasotec [enalapril], and Lasix [furosemide]  Review of Systems   Review of Systems  Constitutional: Positive for fatigue. Negative for chills and fever.  HENT: Negative for congestion.   Eyes: Negative for visual disturbance.  Respiratory: Negative for shortness of breath.   Cardiovascular: Negative for chest pain.  Gastrointestinal: Negative for abdominal pain and vomiting.  Endocrine: Positive for polydipsia and polyuria.  Genitourinary: Negative for dysuria and flank pain.  Musculoskeletal: Positive for gait problem. Negative for back pain, neck pain and neck stiffness.  Skin: Negative for rash.  Neurological: Negative for light-headedness and headaches.    Physical Exam Updated Vital Signs BP (!) 155/71   Pulse 68   Temp 97.8 F (36.6 C)   Resp 18   SpO2 94%   Physical Exam Vitals and nursing note reviewed.  Constitutional:      Appearance: He is well-developed.  HENT:     Head: Normocephalic and atraumatic.     Mouth/Throat:     Mouth: Mucous membranes are dry.   Eyes:     General:        Right eye: No discharge.        Left eye: No discharge.     Conjunctiva/sclera: Conjunctivae normal.  Neck:     Trachea: No tracheal deviation.  Cardiovascular:     Rate and Rhythm: Normal rate and regular rhythm.  Pulmonary:     Effort: Pulmonary effort is normal.     Breath sounds: Normal breath sounds.  Abdominal:     General: There is no distension.     Palpations: Abdomen is soft.     Tenderness: There is no abdominal tenderness.  There is no guarding.  Musculoskeletal:        General: Swelling, tenderness and signs of injury present. No deformity. Normal range of motion.     Cervical back: Normal range of motion and neck supple.  Skin:    General: Skin is warm.     Capillary Refill: Capillary refill takes less than 2 seconds.     Findings: No rash.  Neurological:     Mental Status: He is alert and oriented to person, place, and time.     Cranial Nerves: No cranial nerve deficit.     Comments: Patient has tenderness, swelling and mild ecchymosis left buttocks region, mild pain with flexion of the left hip.  Patient is mild tenderness to left posterior elbow region without effusion.  No other tenderness to extremities or spine. Patient has difficulty with hearing need to speak very loud.   Psychiatric:        Mood and Affect: Mood normal.     ED Results / Procedures / Treatments   Labs (all labs ordered are listed, but only abnormal results are displayed) Labs Reviewed  BASIC METABOLIC PANEL - Abnormal; Notable for the following components:      Result Value   Sodium 122 (*)    Chloride 88 (*)    CO2 19 (*)    Glucose, Bld 846 (*)    BUN 55 (*)    Creatinine, Ser 2.59 (*)    Calcium 8.3 (*)    GFR, Estimated 25 (*)    All other components within normal limits  URINALYSIS, ROUTINE W REFLEX MICROSCOPIC - Abnormal; Notable for the following components:   Color, Urine COLORLESS (*)    Glucose, UA >=500 (*)    Hgb urine dipstick MODERATE  (*)    All other components within normal limits  URINE CULTURE  RESPIRATORY PANEL BY RT PCR (FLU A&B, COVID)  CBC WITH DIFFERENTIAL/PLATELET  BLOOD GAS, VENOUS  CBG MONITORING, ED    EKG None  Radiology DG Elbow Complete Left  Result Date: 06/06/2020 CLINICAL DATA:  Pain following fall EXAM: LEFT ELBOW - COMPLETE 3+ VIEW COMPARISON:  None. FINDINGS: Frontal, lateral, and bilateral oblique views were obtained. No fracture, dislocation, or joint effusion. There is spurring along the medial distal humeral condyle. No appreciable joint space narrowing or erosion. IMPRESSION: Spurring along the medial distal humeral condyle. No appreciable joint space narrowing. No fracture or dislocation. Electronically Signed   By: Lowella Grip III M.D.   On: 06/06/2020 12:18   DG Hip Unilat W or Wo Pelvis 2-3 Views Left  Result Date: 06/06/2020 CLINICAL DATA:  Left hip pain since a fall 06/03/2020. Initial encounter. EXAM: DG HIP (WITH OR WITHOUT PELVIS) 2-3V LEFT COMPARISON:  Plain films left hip 03/23/2020. FINDINGS: There is no acute bony or joint abnormality. Hip joint spaces are preserved. Scattered enthesopathic change noted. IMPRESSION: No acute abnormality. Electronically Signed   By: Inge Rise M.D.   On: 06/06/2020 12:18    Procedures .Critical Care Performed by: Elnora Morrison, MD Authorized by: Elnora Morrison, MD   Critical care provider statement:    Critical care time (minutes):  35   Critical care start time:  06/06/2020 12:00 PM   Critical care end time:  06/06/2020 12:35 PM   Critical care time was exclusive of:  Separately billable procedures and treating other patients and teaching time   Critical care was necessary to treat or prevent imminent or life-threatening deterioration of the following conditions:  Endocrine  crisis   Critical care was time spent personally by me on the following activities:  Evaluation of patient's response to treatment, examination of patient,  ordering and performing treatments and interventions, ordering and review of laboratory studies, ordering and review of radiographic studies, pulse oximetry, re-evaluation of patient's condition and review of old charts   (including critical care time)  Medications Ordered in ED Medications  acetaminophen (TYLENOL) tablet 1,000 mg (has no administration in time range)  insulin regular, human (MYXREDLIN) 100 units/ 100 mL infusion (has no administration in time range)  lactated ringers infusion (has no administration in time range)  dextrose 5 % in lactated ringers infusion (has no administration in time range)  dextrose 50 % solution 0-50 mL (has no administration in time range)  potassium chloride 10 mEq in 100 mL IVPB (has no administration in time range)    ED Course  I have reviewed the triage vital signs and the nursing notes.  Pertinent labs & imaging results that were available during my care of the patient were reviewed by me and considered in my medical decision making (see chart for details).    MDM Rules/Calculators/A&P                          Patient presents with general weakness and a few falls recently.  Isolated left hip contusion and left elbow, x-rays reviewed no acute fracture.  Plan for Tylenol for pain.  General blood work ordered due to general weakness and increased urination urinalysis ordered.  Urine showed high level greater than 500 glucose and blood without ketones.  Blood work revealed hyperglycemia 846, hyponatremia likely secondary to the glucose at 122, kidney function chronic renal failure 2.59. Pt was placed on steroids recently for reported gout.  With significant elevated glucose plan for insulin drip to titrate, IV fluids ordered.  Patient has congestive heart failure history so would like to be cautious with the fluid bolus.  Potassium ordered to counter effect on potassium from insulin.  The patients results and plan were reviewed and discussed.    Any x-rays performed were independently reviewed by myself.   Differential diagnosis were considered with the presenting HPI.  Medications  acetaminophen (TYLENOL) tablet 1,000 mg (has no administration in time range)  insulin regular, human (MYXREDLIN) 100 units/ 100 mL infusion (has no administration in time range)  lactated ringers infusion (has no administration in time range)  dextrose 5 % in lactated ringers infusion (has no administration in time range)  dextrose 50 % solution 0-50 mL (has no administration in time range)  potassium chloride 10 mEq in 100 mL IVPB (has no administration in time range)    Vitals:   06/06/20 1114 06/06/20 1230 06/06/20 1238  BP: (!) 170/74 (!) 155/71   Pulse: 64 68   Resp: (!) 24 18 18   Temp: 97.8 F (36.6 C)    SpO2: 100% 94%     Final diagnoses:  Hyperglycemia due to diabetes mellitus (HCC)  General weakness  Fall, initial encounter  Contusion of left hip, initial encounter    Admission/ observation were discussed with the admitting physician, patient and/or family and they are comfortable with the plan.    Final Clinical Impression(s) / ED Diagnoses Final diagnoses:  Hyperglycemia due to diabetes mellitus ([place])  General weakness  Fall, initial encounter  Contusion of left hip, initial encounter    Rx / DC Orders ED Discharge Orders    None  Elnora Morrison, MD 06/06/20 1530

## 2020-06-06 NOTE — ED Triage Notes (Signed)
Fell 3 days ago, pain in left hip and left elbow

## 2020-06-06 NOTE — H&P (Signed)
History and Physical    Jack Mejia JEH:[id number] DOB: 1946-04-18 DOA: 06/06/2020  PCP: Erven Colla, DO   Patient coming from: Home.  I have personally briefly reviewed patient's old medical records in [place]  Chief Complaint: Generalized weakness and increased frequency.  HPI: Jack Mejia is a 74 y.o. male with medical history significant of paroxysmal atrial fibrillation (status post cardioversion and chronically on apixaban), lumbar stenosis/chronic pain, type 2 diabetes with nephropathy, chronic kidney disease a stage IIIb, hypertension and hyperlipidemia; who presented to the hospital secondary to generalized weakness and increased urinary frequency.  Patient reports decreased appetite and recent visit to his PCP office secondary to acute right knee pain that was treated with prednisone for suspected gout flare.  Patient reports taking medications as prescribed prior to this event reporting stable sugar levels.  Most recent A1c 6.2 (04/30/20) Patient denies chest pain, shortness of breath, fever, chills, dysuria, hematuria, nausea, vomiting, abdominal pain, focal weakness or any other complaints.  ED Course: No signs of acute infection appreciated.  Patient with acute hyperosmolar hyperglycemic state and normal anion gap.  Stable chronic kidney disease a stage IIIb.  IV fluids and IV insulin therapy initiated.  TRH has been called to place patient in the hospital for further evaluation and management.  Review of Systems: As per HPI otherwise all other systems reviewed and are negative.   Past Medical History:  Diagnosis Date  . Arthritis   . Asthma   . Atrial fibrillation ([place])    a. s/p DCCV in 03/2019  . CAD (coronary artery disease) 06/29/2019  . Cramps of left lower extremity   . Depression   . Diabetes mellitus    type 2 for 7-8 yrs  . Dyspnea   . Elevated liver enzymes   . Fatty liver   . History of kidney stones   . Hyperlipidemia   .  Hypertension   . Renal insufficiency   . Vertigo     Past Surgical History:  Procedure Laterality Date  . CARDIOVERSION N/A 04/21/2019   Procedure: CARDIOVERSION;  Surgeon: Sanda Klein, MD;  Location: [facility];  Service: Cardiovascular;  Laterality: N/A;  . CARPAL TUNNEL RELEASE     both wrist  . cataract surgery     bilateral  . CHOLECYSTECTOMY    . EYE SURGERY    . HEMORROIDECTOMY    . KNEE SURGERY Left   . LEFT HEART CATH AND CORONARY ANGIOGRAPHY N/A 05/23/2019   Procedure: LEFT HEART CATH AND CORONARY ANGIOGRAPHY;  Surgeon: Belva Crome, MD;  Location: [facility];  Service: Cardiovascular;  Laterality: N/A;  . LUMBAR LAMINECTOMY/DECOMPRESSION MICRODISCECTOMY N/A 08/06/2016   Procedure: LUMBAR THREE- LUMBAR FIVE  DECOMPRESSIVE LUMBAR LAMINECTOMY;  Surgeon: Jovita Gamma, MD;  Location: [place] [facility];  Service: Neurosurgery;  Laterality: N/A;    Social History  reports that he quit smoking about 37 years ago. His smoking use included cigarettes. He has a 80.00 pack-year smoking history. He has never used smokeless tobacco. He reports that he does not drink alcohol and does not use drugs.  Allergies  Allergen Reactions  . Demerol Nausea And Vomiting  . Vasotec [Enalapril] Cough  . Lasix [Furosemide] Rash    Family History  Problem Relation Age of Onset  . Diabetes Mother   . Cancer Mother        unknown kind  . Diabetes Sister   . COPD Sister   . Liver disease Brother   . COPD  Brother   . Diabetes Maternal Grandmother   . Diabetes Brother   . COPD Sister   . Healthy Daughter   . Healthy Daughter     Prior to Admission medications   Medication Sig Start Date End Date Taking? Authorizing Provider  amiodarone (PACERONE) 200 MG tablet TAKE 1 TABLET BY MOUTH EVERY DAY 12/05/19  Yes Herminio Commons, MD  apixaban (ELIQUIS) 5 MG TABS tablet Take 1 tablet (5 mg total) by mouth 2 (two) times daily. 10/03/19  Yes Herminio Commons, MD  atorvastatin (LIPITOR)  20 MG tablet Take 1 tablet (20 mg total) by mouth daily. 10/03/19 09/27/20 Yes Herminio Commons, MD  diltiazem (CARDIZEM CD) 360 MG 24 hr capsule Take 1 capsule (360 mg total) by mouth daily. 04/22/20  Yes Strader, [place], PA-C  empagliflozin (JARDIANCE) 10 MG TABS tablet Take 1 tablet (10 mg total) by mouth daily before breakfast. 04/23/20  Yes Lovena Le, Malena M, DO  glipiZIDE (GLUCOTROL) 5 MG tablet Take 2 tablets (10 mg total) by mouth 2 (two) times daily. 04/22/20  Yes Lovena Le, Malena M, DO  metoprolol tartrate (LOPRESSOR) 25 MG tablet Take 1 tablet (25 mg total) by mouth 2 (two) times daily. 04/30/20  Yes Lovena Le, Malena M, DO  predniSONE (DELTASONE) 20 MG tablet Take 3 tablets for 3 days, then 2 tablets for 3 days, then one tablet for 3 days by mouth Patient taking differently: Take 20 mg by mouth See admin instructions. Take 3 tablets for 3 days, then 2 tablets for 3 days, then one tablet for 3 days by mouth 05/31/20  Yes Dolby, Craige Cotta, NP  torsemide (DEMADEX) 20 MG tablet Take 1 tablet (20 mg total) by mouth daily. 05/31/20 05/26/21 Yes Verta Ellen., NP    Physical Exam: Vitals:   06/06/20 1630 06/06/20 1700 06/06/20 1730 06/06/20 1830  BP: (!) 155/68 (!) 141/67 (!) 176/115 (!) 152/64  Pulse: (!) 59 (!) 57 (!) 58 63  Resp: 19 17 (!) 24 17  Temp:      SpO2: 99% 99% 98% 97%    Constitutional: No chest pain, no abdominal pain, no nausea or vomiting.  Expressed feeling generally weak and deconditioned. Vitals:   06/06/20 1630 06/06/20 1700 06/06/20 1730 06/06/20 1830  BP: (!) 155/68 (!) 141/67 (!) 176/115 (!) 152/64  Pulse: (!) 59 (!) 57 (!) 58 63  Resp: 19 17 (!) 24 17  Temp:      SpO2: 99% 99% 98% 97%   Eyes: PERRL, lids and conjunctivae normal; no icterus, no nystagmus. ENMT: Mucous membranes are moist. Posterior pharynx clear of any exudate or lesions. hard of hearing. Neck: normal, supple, no masses, no thyromegaly Respiratory: clear to auscultation bilaterally, no  wheezing, no crackles. Normal respiratory effort. No accessory muscle use.  Cardiovascular: Rate control, currently sinus; no rubs, no gallops, no JVD Abdomen: no tenderness, no masses palpated. No hepatosplenomegaly. Bowel sounds positive.  Musculoskeletal: no clubbing / cyanosis. No joint deformity upper and lower extremities. Good ROM, no contractures.  Skin: no rashes, no petechiae. Neurologic: CN 2-12 grossly intact. Sensation intact, DTR normal. 4/5 bilaterally in the setting of poor effort and deconditioning. Psychiatric: Normal judgment and insight. Alert and oriented x 3. Normal mood.    Labs on Admission: I have personally reviewed following labs and imaging studies  CBC: Recent Labs  Lab 06/06/20 1144  WBC 32.8*  NEUTROABS 23.3*  HGB 9.0*  HCT 28.0*  MCV 116.7*  PLT 125*  Basic Metabolic Panel: Recent Labs  Lab 06/06/20 1144  NA 122*  K 4.4  CL 88*  CO2 19*  GLUCOSE 846*  BUN 55*  CREATININE 2.59*  CALCIUM 8.3*    GFR: Estimated Creatinine Clearance: 29 mL/min (A) (by C-G formula based on SCr of 2.59 mg/dL (H)).  Urine analysis:    Component Value Date/Time   COLORURINE COLORLESS (A) 06/06/2020 1151   APPEARANCEUR CLEAR 06/06/2020 1151   LABSPEC 1.013 06/06/2020 1151   PHURINE 5.0 06/06/2020 1151   GLUCOSEU >=500 (A) 06/06/2020 1151   HGBUR MODERATE (A) 06/06/2020 1151   BILIRUBINUR NEGATIVE 06/06/2020 1151   KETONESUR NEGATIVE 06/06/2020 1151   PROTEINUR NEGATIVE 06/06/2020 1151   NITRITE NEGATIVE 06/06/2020 1151   LEUKOCYTESUR NEGATIVE 06/06/2020 1151    Radiological Exams on Admission: DG Elbow Complete Left  Result Date: 06/06/2020 CLINICAL DATA:  Pain following fall EXAM: LEFT ELBOW - COMPLETE 3+ VIEW COMPARISON:  None. FINDINGS: Frontal, lateral, and bilateral oblique views were obtained. No fracture, dislocation, or joint effusion. There is spurring along the medial distal humeral condyle. No appreciable joint space narrowing or erosion.  IMPRESSION: Spurring along the medial distal humeral condyle. No appreciable joint space narrowing. No fracture or dislocation. Electronically Signed   By: Lowella Grip III M.D.   On: 06/06/2020 12:18   DG Hip Unilat W or Wo Pelvis 2-3 Views Left  Result Date: 06/06/2020 CLINICAL DATA:  Left hip pain since a fall 06/03/2020. Initial encounter. EXAM: DG HIP (WITH OR WITHOUT PELVIS) 2-3V LEFT COMPARISON:  Plain films left hip 03/23/2020. FINDINGS: There is no acute bony or joint abnormality. Hip joint spaces are preserved. Scattered enthesopathic change noted. IMPRESSION: No acute abnormality. Electronically Signed   By: Inge Rise M.D.   On: 06/06/2020 12:18    EKG: None  Assessment/Plan 1-hyperosmolar hyperglycemic state (HHS) (HCC) -No signs of acute infection appreciated -Most recent A1c 6.2 (04/30/2020) -Acute hypoglycemic event most likely trigger by use of steroids as an outpatient for gout flare. -Holding his steroids -Initiating patient on insulin drip -N.p.o. for now -Close monitoring of patient electrolytes -Normal anion gap. -At home he is on Jardiance and glipizide.  2-HTN (hypertension) -Overall stable and well-controlled -Resume home antihypertensive regimen.  3-Type 2 diabetes with nephropathy ([place]) -With hyperosmolar hyperglycemic state as mentioned above -Recent A1c 6.2; no need to be repeated -Treat acute hypoglycemic exacerbation -Refer to problem #1.  4-Hyperlipidemia LDL goal <100 -Continue Lipitor  5-Lumbar stenosis with neurogenic claudication and chronic pain -Physical therapy and occupational therapy evaluation has been requested -Will check TSH, B12 and vitamin D level. -As needed analgesic medications has been added.  6-PAF (paroxysmal atrial fibrillation) (HCC) -Rate appears to be well controlled -Continue the use of Cardizem, amiodarone and metoprolol -Chronically on apixaban for secondary prevention.  7-CKD (chronic kidney disease),  stage III ([place]) -In the setting of chronic type 2 diabetes -Appears to be stable at baseline -Continue to closely follow renal function trend and the stability.  8-chronic diastolic heart failure -Overall appears to be stable and euvolemic -Follow daily weights and strict I's and O's -Holding diuretics overnight while actively providing treatment with fluids and insulin drip for acute hyper osmolar hyperglycemic state. -Patient most likely intravascular driving the setting of hyperglycemia.  DVT prophylaxis: Chronically on apixaban. Code Status:   Full code. Family Communication:  Wife at bedside. Disposition Plan:   Patient is from:  Home  Anticipated DC to:  Hopefully home with home health services.  Anticipated DC  date:  06/07/20  Anticipated DC barriers: Blood sugar stabilization. Consults called:  None Admission status:  Observation, stepdown, length of stay less than 2 midnights.  Severity of Illness: Mild to moderate severity; patient with recent gout flare as an outpatient and started on prednisone and comes to the hospital with severe hyperglycemia, generalized weakness, dehydration and history of fall.  Status has been placed on hold.  Fluid resuscitation will be provided, CBGs in the 840 range so insulin drip has been initiated.  No DKA.  Normal anion gap.    Barton Dubois MD [facility]  How to contact the [facility] Attending or Consulting provider [place] or covering provider during after hours [place], for this patient?   1. Check the care team in [facility] and look for a) attending/consulting TRH provider listed and b) the [facility] team listed 2. Log into [email or web address] and use [place]'s universal password to access. If you do not have the password, please contact the hospital operator. 3. Locate the [facility] provider you are looking for under [facility] and page to a number that you can be directly reached. 4. If you still have difficulty reaching the provider, please  page the [facility] (Director on Call) for the Hospitalists listed on amion for assistance.  06/06/2020, 6:59 PM

## 2020-06-06 NOTE — ED Notes (Addendum)
Date and time results received: 06/06/20 1241   Test: Glucose  Critical Value: 846  Name of Provider Notified:  [place]  Orders Received? Or Actions Taken?: none yet

## 2020-06-06 NOTE — ED Notes (Signed)
Pt to CT

## 2020-06-06 NOTE — ED Notes (Signed)
4 nurses have attempted to obtain a second IV. Pt. Is now refusing a 2nd IV to be placed.

## 2020-06-06 NOTE — Progress Notes (Signed)
Inpatient Diabetes Program Recommendations  AACE/ADA: New Consensus Statement on Inpatient Glycemic Control   Target Ranges:  Prepandial:   less than 140 mg/dL      Peak postprandial:   less than 180 mg/dL (1-2 hours)      Critically ill patients:  140 - 180 mg/dL  Results for Jack Mejia, Jack Mejia (MRN [id number]) as of 06/06/2020 12:48  Ref. Range 06/06/2020 11:44  CO2 Latest Ref Range: 22 - 32 mmol/L 19 (L)  Glucose Latest Ref Range: 70 - 99 mg/dL 846 (HH)  BUN Latest Ref Range: 8 - 23 mg/dL 55 (H)  Creatinine Latest Ref Range: 0.61 - 1.24 mg/dL 2.59 (H)  Anion gap Latest Ref Range: 5 - 15  15   Results for Jack Mejia, Jack Mejia (MRN [id number]) as of 06/06/2020 12:48  Ref. Range 04/30/2020 11:45  Hemoglobin A1C Latest Ref Range: 4.0 - 5.6 % 6.2 (A)   Review of Glycemic Control  Diabetes history: DM2 Outpatient Diabetes medications: Jardiance 10 mg QAM, Glipizide 10 mg BID; Prednisone taper noted on home medication list Current orders for Inpatient glycemic control: None; just arrived in Emergency Room at 11:10 today  Inpatient Diabetes Program Recommendations:    Insulin: Will likely require IV insulin for hyperglycemia.  NOTE: In reviewing chart, noted patient seen K. Dolby, NP on 05/31/20 for gout flare of right knee and per office note patient was prescribed Prednisone taper (also noted patient was prescribed Prednisone in July as well for gout flare up). Patient presented to Emergency Room today at 11:10 with reports of fall 3 days ago with pain in left hip and elbow. Noted to have glucose of 846 mg/dl on labs at 11:44 today.   Thanks, Barnie Alderman, RN, MSN, CDE Diabetes Coordinator Inpatient Diabetes Program [phone] (Team Pager from 8am to 5pm)

## 2020-06-07 DIAGNOSIS — I480 Paroxysmal atrial fibrillation: Secondary | ICD-10-CM | POA: Diagnosis not present

## 2020-06-07 DIAGNOSIS — E1165 Type 2 diabetes mellitus with hyperglycemia: Secondary | ICD-10-CM | POA: Diagnosis not present

## 2020-06-07 DIAGNOSIS — E785 Hyperlipidemia, unspecified: Secondary | ICD-10-CM | POA: Diagnosis not present

## 2020-06-07 DIAGNOSIS — S7002XA Contusion of left hip, initial encounter: Secondary | ICD-10-CM

## 2020-06-07 DIAGNOSIS — W19XXXA Unspecified fall, initial encounter: Secondary | ICD-10-CM | POA: Diagnosis not present

## 2020-06-07 DIAGNOSIS — N1832 Chronic kidney disease, stage 3b: Secondary | ICD-10-CM | POA: Diagnosis not present

## 2020-06-07 DIAGNOSIS — E1100 Type 2 diabetes mellitus with hyperosmolarity without nonketotic hyperglycemic-hyperosmolar coma (NKHHC): Secondary | ICD-10-CM | POA: Diagnosis not present

## 2020-06-07 DIAGNOSIS — R531 Weakness: Secondary | ICD-10-CM

## 2020-06-07 DIAGNOSIS — M48062 Spinal stenosis, lumbar region with neurogenic claudication: Secondary | ICD-10-CM | POA: Diagnosis not present

## 2020-06-07 DIAGNOSIS — I10 Essential (primary) hypertension: Secondary | ICD-10-CM | POA: Diagnosis not present

## 2020-06-07 LAB — URINE CULTURE

## 2020-06-07 LAB — GLUCOSE, CAPILLARY
Glucose-Capillary: 131 mg/dL — ABNORMAL HIGH (ref 70–99)
Glucose-Capillary: 139 mg/dL — ABNORMAL HIGH (ref 70–99)
Glucose-Capillary: 166 mg/dL — ABNORMAL HIGH (ref 70–99)
Glucose-Capillary: 168 mg/dL — ABNORMAL HIGH (ref 70–99)
Glucose-Capillary: 170 mg/dL — ABNORMAL HIGH (ref 70–99)
Glucose-Capillary: 290 mg/dL — ABNORMAL HIGH (ref 70–99)

## 2020-06-07 LAB — BASIC METABOLIC PANEL
Anion gap: 12 (ref 5–15)
BUN: 49 mg/dL — ABNORMAL HIGH (ref 8–23)
CO2: 23 mmol/L (ref 22–32)
Calcium: 8.5 mg/dL — ABNORMAL LOW (ref 8.9–10.3)
Chloride: 101 mmol/L (ref 98–111)
Creatinine, Ser: 2.02 mg/dL — ABNORMAL HIGH (ref 0.61–1.24)
GFR, Estimated: 34 mL/min — ABNORMAL LOW (ref >60)
Glucose, Bld: 144 mg/dL — ABNORMAL HIGH (ref 70–99)
Potassium: 3.5 mmol/L (ref 3.5–5.1)
Sodium: 136 mmol/L (ref 135–145)

## 2020-06-07 LAB — CBC
HCT: 25.5 % — ABNORMAL LOW (ref 39.0–52.0)
Hemoglobin: 8.2 g/dL — ABNORMAL LOW (ref 13.0–17.0)
MCH: 36.9 pg — ABNORMAL HIGH (ref 26.0–34.0)
MCHC: 32.2 g/dL (ref 30.0–36.0)
MCV: 114.9 fL — ABNORMAL HIGH (ref 80.0–100.0)
Platelets: 107 10*3/uL — ABNORMAL LOW (ref 150–400)
RBC: 2.22 MIL/uL — ABNORMAL LOW (ref 4.22–5.81)
RDW: 17 % — ABNORMAL HIGH (ref 11.5–15.5)
WBC: 27.2 10*3/uL — ABNORMAL HIGH (ref 4.0–10.5)
nRBC: 0.1 % (ref 0.0–0.2)

## 2020-06-07 LAB — MRSA PCR SCREENING: MRSA by PCR: NEGATIVE

## 2020-06-07 LAB — VITAMIN D 25 HYDROXY (VIT D DEFICIENCY, FRACTURES): Vit D, 25-Hydroxy: 19.52 ng/mL — ABNORMAL LOW (ref 30–100)

## 2020-06-07 MED ORDER — INSULIN GLARGINE 100 UNIT/ML ~~LOC~~ SOLN
10.0000 [IU] | Freq: Two times a day (BID) | SUBCUTANEOUS | Status: DC
  Filled 2020-06-07 (×7): qty 0.1

## 2020-06-07 MED ORDER — LACTATED RINGERS IV SOLN: INTRAVENOUS | Status: DC

## 2020-06-07 MED ORDER — INSULIN ASPART 100 UNIT/ML ~~LOC~~ SOLN: 0.0000 [IU] | Freq: Every day | SUBCUTANEOUS | Status: DC

## 2020-06-07 MED ORDER — INSULIN ASPART 100 UNIT/ML ~~LOC~~ SOLN
0.0000 [IU] | Freq: Three times a day (TID) | SUBCUTANEOUS | Status: DC
  Administered 2020-06-07: 11 [IU] via SUBCUTANEOUS
  Administered 2020-06-07: 4 [IU] via SUBCUTANEOUS

## 2020-06-07 MED ORDER — INSULIN GLARGINE 100 UNIT/ML ~~LOC~~ SOLN
10.0000 [IU] | Freq: Every day | SUBCUTANEOUS | Status: DC
  Filled 2020-06-07: qty 0.1

## 2020-06-07 MED ORDER — INSULIN GLARGINE 100 UNIT/ML ~~LOC~~ SOLN
10.0000 [IU] | Freq: Every day | SUBCUTANEOUS | Status: DC
  Administered 2020-06-07: 10 [IU] via SUBCUTANEOUS
  Filled 2020-06-07: qty 0.1

## 2020-06-07 MED ORDER — VITAMIN D (ERGOCALCIFEROL) 1.25 MG (50000 UNIT) PO CAPS
50000.0000 [IU] | ORAL_CAPSULE | ORAL | Status: DC
  Filled 2020-06-07: qty 1

## 2020-06-07 MED ORDER — TRAMADOL HCL 50 MG PO TABS
50.0000 mg | ORAL_TABLET | Freq: Two times a day (BID) | ORAL | 0 refills | Status: DC | PRN
Start: 2020-06-07 — End: 2020-06-18

## 2020-06-07 MED ORDER — CHLORHEXIDINE GLUCONATE CLOTH 2 % EX PADS
6.0000 | MEDICATED_PAD | Freq: Every day | CUTANEOUS | Status: DC
  Administered 2020-06-07: 6 via TOPICAL

## 2020-06-07 MED ORDER — VITAMIN D (ERGOCALCIFEROL) 1.25 MG (50000 UNIT) PO CAPS
50000.0000 [IU] | ORAL_CAPSULE | ORAL | 0 refills | Status: DC
Start: 2020-06-07 — End: 2020-07-15

## 2020-06-07 NOTE — Evaluation (Addendum)
Physical Therapy Evaluation Patient Details Name: Jack Mejia MRN: [id number] DOB: [birth year] Today's Date: 06/07/2020   History of Present Illness  Jack Mejia is a 74 y.o. male with medical history significant of paroxysmal atrial fibrillation (status post cardioversion and chronically on apixaban), lumbar stenosis/chronic pain, type 2 diabetes with nephropathy, chronic kidney disease a stage IIIb, hypertension and hyperlipidemia; who presented to the hospital secondary to generalized weakness and increased urinary frequency.  Patient reports decreased appetite and recent visit to his PCP office secondary to acute right knee pain that was treated with prednisone for suspected gout flare.  Patient reports taking medications as prescribed prior to this event reporting stable sugar levels.  Most recent A1c 6.2 (04/30/20)Patient denies chest pain, shortness of breath, fever, chills, dysuria, hematuria, nausea, vomiting, abdominal pain, focal weakness or any other complaints.    Clinical Impression  Patient exhibits new onset left hip pain following ground-level fall and demonstrates decrease in functional mobility and activity tolerance as a result. Increased time needed for bed mobility and assuming upright position. Guarding needed for transfers due to multiple attempts for successful sit to stand and requiring BUE support on RW due to left hip pain and antalgic gait pattern.  Limited ambulation tolerance and needing use of RW for ambulation on level surfaces with frequent rest periods.    Follow Up Recommendations Home health PT    Equipment Recommendations  None recommended by PT    Recommendations for Other Services       Precautions / Restrictions Precautions Precautions: Fall      Mobility  Bed Mobility Overal bed mobility: Independent                  Transfers Overall transfer level: Needs assistance Equipment used: Rolling walker (2 wheeled) Transfers: Sit  to/from [facility] Sit to Stand: Min guard Stand pivot transfers: Min guard       General transfer comment: multiple attempts for sit to stand and cues for UE placement and sequence  Ambulation/Gait   Gait Distance (Feet): 25 Feet Assistive device: Rolling walker (2 wheeled) Gait Pattern/deviations: Antalgic Gait velocity: decreased   General Gait Details: decreased stance time/weight acceptance LLE due to recent onset of hip pain  Stairs            Wheelchair Mobility    Modified Rankin (Stroke Patients Only)       Balance Overall balance assessment: Mild deficits observed, not formally tested;History of Falls         Standing balance support: Bilateral upper extremity supported (via RW)                                 Pertinent Vitals/Pain Pain Assessment: Faces Faces Pain Scale: Hurts little more Pain Location: (L) hip Pain Descriptors / Indicators: Discomfort;Grimacing Pain Intervention(s): Limited activity within patient's tolerance;Monitored during session    Home Living Family/patient expects to be discharged to:: Private residence Living Arrangements: Alone Available Help at Discharge: Family;Available PRN/intermittently Type of Home: House Home Access: Level entry     Home Layout: One level Home Equipment: Walker - 2 wheels      Prior Function Level of Independence: Independent               Hand Dominance   Dominant Hand: Right    Extremity/Trunk Assessment   Upper Extremity Assessment Upper Extremity Assessment: Overall WFL for tasks assessed  Lower Extremity Assessment Lower Extremity Assessment: Generalized weakness       Communication   Communication: No difficulties  Cognition   Behavior During Therapy: WFL for tasks assessed/performed Overall Cognitive Status: Within Functional Limits for tasks assessed                                        General Comments       Exercises     Assessment/Plan    PT Assessment Patient needs continued PT services  PT Problem List Decreased strength;Decreased mobility;Decreased activity tolerance;Decreased balance       PT Treatment Interventions DME instruction;Gait training;Functional mobility training;Therapeutic exercise;Patient/family education;Balance training;Therapeutic activities    PT Goals (Current goals can be found in the Care Plan section)  Acute Rehab PT Goals Patient Stated Goal: Walk with better balance PT Goal Formulation: With patient Time For Goal Achievement: 06/14/20 Potential to Achieve Goals: Good    Frequency Min 3X/week   Barriers to discharge        Co-evaluation               AM-PAC PT "6 Clicks" Mobility  Outcome Measure Help needed turning from your back to your side while in a flat bed without using bedrails?: None Help needed moving from lying on your back to sitting on the side of a flat bed without using bedrails?: None Help needed moving to and from a bed to a chair (including a wheelchair)?: A Little Help needed standing up from a chair using your arms (e.g., wheelchair or bedside chair)?: A Little Help needed to walk in hospital room?: A Little Help needed climbing 3-5 steps with a railing? : A Lot 6 Click Score: 19    End of Session   Activity Tolerance: Patient tolerated treatment well;Patient limited by fatigue Patient left: in bed Nurse Communication: Mobility status PT Visit Diagnosis: Unsteadiness on feet (R26.81);Other abnormalities of gait and mobility (R26.89);Muscle weakness (generalized) (M62.81)    Time: 8182-9937 PT Time Calculation (min) (ACUTE ONLY): 30 min   Charges:   PT Evaluation $PT Eval Low Complexity: 1 Low PT Treatments $Gait Training: 8-22 mins $Therapeutic Exercise: 8-22 mins        2:46 PM, 06/07/20 M. Sherlyn Lees, PT, DPT Physical Therapist- [place] Office Number: [phone]

## 2020-06-07 NOTE — TOC Transition Note (Signed)
Transition of Care [facility]) - CM/SW Discharge Note   Patient Details  Name: WILBERTO CONSOLE MRN: [id number] Date of Birth: 12-11-1945  Transition [facility] CM/SW Contact:  Shade Flood, LCSW Phone Number: 06/07/2020, 3:28 PM   Clinical Narrative:     Pt being discharged today with orders for HHPT. Pt agreeable to [facility] and will accept any agency that will take his insurance. Referred to [facility] at [place]. They can see pt early next week. Alvis Lemmings will call pt's sig other, Lelon Frohlich, at pt request as pt is going to stay at her house at dc and pt has difficulty with hearing on the phone.  RN updated pt and Ann. There are no other TOC needs for dc.  Final next level of care: Home w Home Health Services Barriers to Discharge: Barriers Resolved   Patient Goals and CMS Choice   CMS Medicare.gov Compare Post Acute Care list provided to:: Patient Choice offered to / list presented to : Patient  Discharge Placement                       Discharge Plan and Services                          HH Arranged: PT [facility] Agency: [place] Date [place]: 06/07/20   Representative spoke with at [place]: [place] ([place]) Interventions     Readmission Risk Interventions No flowsheet data found.

## 2020-06-07 NOTE — Plan of Care (Signed)
  Problem: Acute Rehab PT Goals(only PT should resolve) Goal: Patient Will Transfer Sit To/From Stand Outcome: Progressing Flowsheets (Taken 06/07/2020 1435) Patient will transfer sit to/from stand: with modified independence Goal: Pt Will Transfer Bed To Chair/Chair To Bed Outcome: Progressing Flowsheets (Taken 06/07/2020 1435) Pt will Transfer Bed to Chair/Chair to Bed: with modified independence Goal: Pt Will Ambulate Outcome: Progressing Flowsheets (Taken 06/07/2020 1435) Pt will Ambulate:  100 feet  with supervision  with rolling walker Goal: Pt Will Go Up/Down Stairs Outcome: Progressing Flowsheets (Taken 06/07/2020 1435) Pt will Go Up / Down Stairs:  3-5 stairs  with supervision   2:37 PM, 06/07/20 M. Sherlyn Lees, PT, DPT Physical Therapist- [place] Office Number: [phone]

## 2020-06-07 NOTE — Discharge Summary (Signed)
Physician Discharge Summary  Jack Mejia OXB:[id number] DOB: April 05, 1946 DOA: 06/06/2020  PCP: Erven Colla, DO  Admit date: 06/06/2020 Discharge date: 06/07/2020  Time spent: 35 minutes  Recommendations for Outpatient Follow-up:  1. Repeat vitamin D level in 12 weeks 2. Repeat basic metabolic panel to follow electrolytes and renal function and stability 3. Close monitoring of patient's CBGs/A1c as an outpatient with further adjustment to his regimen as needed.   Discharge Diagnoses:  Principal Problem:   Hyperosmolar hyperglycemic state (HHS) ([place]) Active Problems:   HTN (hypertension)   Type 2 diabetes with nephropathy (HCC)   Hyperlipidemia LDL goal <100   Lumbar stenosis with neurogenic claudication   PAF (paroxysmal atrial fibrillation) (HCC)   CKD (chronic kidney disease), stage III (HCC)   Contusion of left hip   General weakness   Discharge Condition: Stable and improved.  Discharged home with instruction to follow-up with PCP in 10 days.  CODE STATUS: Full code.  Diet recommendation: Heart healthy/modified carbohydrate diet.  Filed Weights   06/06/20 2126  Weight: 90.8 kg    History of present illness:  Jack Mejia is a 74 y.o. male with medical history significant of paroxysmal atrial fibrillation (status post cardioversion and chronically on apixaban), lumbar stenosis/chronic pain, type 2 diabetes with nephropathy, chronic kidney disease a stage IIIb, hypertension and hyperlipidemia; who presented to the hospital secondary to generalized weakness and increased urinary frequency.  Patient reports decreased appetite and recent visit to his PCP office secondary to acute right knee pain that was treated with prednisone for suspected gout flare.  Patient reports taking medications as prescribed prior to this event reporting stable sugar levels.  Most recent A1c 6.2 (04/30/20) Patient denies chest pain, shortness of breath, fever, chills, dysuria, hematuria,  nausea, vomiting, abdominal pain, focal weakness or any other complaints.  ED Course: No signs of acute infection appreciated.  Patient with acute hyperosmolar hyperglycemic state and normal anion gap.  Stable chronic kidney disease a stage IIIb.  IV fluids and IV insulin therapy initiated.  TRH has been called to place patient in the hospital for further evaluation and management.  Hospital Course:  1-hyperosmolar hyperglycemic state (HHS) ([place]) -No signs of acute infection appreciated -Most recent A1c 6.2 (04/30/2020) -Acute hypoglycemic event in the setting of prednisone usage -After use of insulin drip hyperosmolar hyperglycemic state corrected. -Patient will resume home hypoglycemic regimen -No further steroids anticipated at discharge -Advised to maintain adequate hydration and to follow modified carbohydrate diet.  2-HTN (hypertension) -Overall stable and well-controlled -Resume home antihypertensive regimen. -Heart healthy diet encouraged.  3-Type 2 diabetes with nephropathy ([place]) -With hyperosmolar hyperglycemic state as mentioned above -Recent A1c 6.2 -Will recommend repeat A1c as an outpatient in a weeks. -Properly treated with IV insulin as mentioned above -Resume home hypoglycemic regimen.  4-Hyperlipidemia LDL goal <100 -Continue Lipitor -Heart healthy diet emphasized.  5-Lumbar stenosis with neurogenic claudication and chronic pain -Physical therapy has seen patient and recommended home health PT at discharge. -TSH and B12 within normal limits -Vitamin D level found to be at 19; weekly high-dose vitamin D supplementation recommended -Repeat vitamin D level in 12 weeks.   -Short course of tramadol has been provided at discharge to assist with pain.  6-PAF (paroxysmal atrial fibrillation) (HCC) -Rate appears to be well controlled. -Continue the use of Cardizem, amiodarone and metoprolol -Chronically on apixaban for secondary prevention. -Continue outpatient  follow-up with cardiology service.  7-CKD (chronic kidney disease), stage III ([place]) -In the setting of  chronic type 2 diabetes -Appears to be stable and at baseline -Repeat basic metabolic panel follow-up visit to reassess renal function and stability.  8-chronic diastolic heart failure -Overall appears to be stable and euvolemic -Continue to follow low-sodium diet and daily weight -Resolving to use her home dose of Demadex.  Procedures: See below for x-ray reports.  Consultations:  None  Discharge Exam: Vitals:   06/07/20 1300 06/07/20 1400  BP: (!) 151/59 (!) 141/56  Pulse: 63 61  Resp: 18 16  Temp:    SpO2: 99% 97%    General: Afebrile, no chest pain, no nausea, no vomiting, still complaining of some pain in his left arm and left hip area.  Tolerating diet without problems and with controlled sugar levels at discharge. Cardiovascular: S1 and S2, sinus rhythm currently; no JVD, no rubs or gallops. Respiratory: Good air movement bilaterally; no requiring oxygen supplementation.  Normal respiratory effort Abdomen: Soft, nontender, nondistended, positive bowel sounds Extremities: No cyanosis or clubbing.  Discharge Instructions   Discharge Instructions    (HEART FAILURE PATIENTS) Call MD:  Anytime you have any of the following symptoms: 1) 3 pound weight gain in 24 hours or 5 pounds in 1 week 2) shortness of breath, with or without a dry hacking cough 3) swelling in the hands, feet or stomach 4) if you have to sleep on extra pillows at night in order to breathe.   Complete by: As directed    Diet - low sodium heart healthy   Complete by: As directed    Diet Carb Modified   Complete by: As directed    Discharge instructions   Complete by: As directed    Take medications as prescribed Maintain adequate hydration Follow-up with PCP in 10 days Follow recommendation by home health services to assist with your rehabilitation and conditioning -Follow low-sodium diet and  check your weight on daily basis. Stop the use of prednisone and resume home hypoglycemic regimen.   Increase activity slowly   Complete by: As directed      Allergies as of 06/07/2020      Reactions   Demerol Nausea And Vomiting   Vasotec [enalapril] Cough   Lasix [furosemide] Rash      Medication List    STOP taking these medications   predniSONE 20 MG tablet Commonly known as: DELTASONE     TAKE these medications   amiodarone 200 MG tablet Commonly known as: PACERONE TAKE 1 TABLET BY MOUTH EVERY DAY   apixaban 5 MG Tabs tablet Commonly known as: Eliquis Take 1 tablet (5 mg total) by mouth 2 (two) times daily.   atorvastatin 20 MG tablet Commonly known as: LIPITOR Take 1 tablet (20 mg total) by mouth daily.   diltiazem 360 MG 24 hr capsule Commonly known as: CARDIZEM CD Take 1 capsule (360 mg total) by mouth daily.   empagliflozin 10 MG Tabs tablet Commonly known as: Jardiance Take 1 tablet (10 mg total) by mouth daily before breakfast.   glipiZIDE 5 MG tablet Commonly known as: GLUCOTROL Take 2 tablets (10 mg total) by mouth 2 (two) times daily.   metoprolol tartrate 25 MG tablet Commonly known as: LOPRESSOR Take 1 tablet (25 mg total) by mouth 2 (two) times daily.   torsemide 20 MG tablet Commonly known as: DEMADEX Take 1 tablet (20 mg total) by mouth daily.   traMADol 50 MG tablet Commonly known as: Ultram Take 1 tablet (50 mg total) by mouth every 12 (twelve) hours as needed for  severe pain.   Vitamin D (Ergocalciferol) 1.25 MG (50000 UNIT) Caps capsule Commonly known as: DRISDOL Take 1 capsule (50,000 Units total) by mouth every 7 (seven) days.      Allergies  Allergen Reactions  . Demerol Nausea And Vomiting  . Vasotec [Enalapril] Cough  . Lasix [Furosemide] Rash    Follow-up Information    Elvia Collum M, DO. Schedule an appointment as soon as possible for a visit in 10 day(s).   Specialty: Family Medicine Contact information: [place] [address] [phone]               The results of significant diagnostics from this hospitalization (including imaging, microbiology, ancillary and laboratory) are listed below for reference.    Significant Diagnostic Studies: DG Elbow Complete Left  Result Date: 06/06/2020 CLINICAL DATA:  Pain following fall EXAM: LEFT ELBOW - COMPLETE 3+ VIEW COMPARISON:  None. FINDINGS: Frontal, lateral, and bilateral oblique views were obtained. No fracture, dislocation, or joint effusion. There is spurring along the medial distal humeral condyle. No appreciable joint space narrowing or erosion. IMPRESSION: Spurring along the medial distal humeral condyle. No appreciable joint space narrowing. No fracture or dislocation. Electronically Signed   By: Lowella Grip III M.D.   On: 06/06/2020 12:18   DG Hip Unilat W or Wo Pelvis 2-3 Views Left  Result Date: 06/06/2020 CLINICAL DATA:  Left hip pain since a fall 06/03/2020. Initial encounter. EXAM: DG HIP (WITH OR WITHOUT PELVIS) 2-3V LEFT COMPARISON:  Plain films left hip 03/23/2020. FINDINGS: There is no acute bony or joint abnormality. Hip joint spaces are preserved. Scattered enthesopathic change noted. IMPRESSION: No acute abnormality. Electronically Signed   By: Inge Rise M.D.   On: 06/06/2020 12:18    Microbiology: Recent Results (from the past 240 hour(s))  Respiratory Panel by RT PCR (Flu A&B, Covid) - Nasopharyngeal Swab     Status: None   Collection Time: 06/06/20 11:38 AM   Specimen: Nasopharyngeal Swab  Result Value Ref Range Status   SARS Coronavirus 2 by RT PCR NEGATIVE NEGATIVE Final    Comment: (NOTE) SARS-CoV-2 target nucleic acids are NOT DETECTED.  The SARS-CoV-2 RNA is generally detectable in upper respiratoy specimens during the acute phase of infection. The lowest concentration of SARS-CoV-2 viral copies this assay can detect is 131 copies/mL. A negative result does not preclude  SARS-Cov-2 infection and should not be used as the sole basis for treatment or other patient management decisions. A negative result may occur with  improper specimen collection/handling, submission of specimen other than nasopharyngeal swab, presence of viral mutation(s) within the areas targeted by this assay, and inadequate number of viral copies (<131 copies/mL). A negative result must be combined with clinical observations, patient history, and epidemiological information. The expected result is Negative.  Fact Sheet for Patients:  PinkCheek.be  Fact Sheet for Healthcare Providers:  GravelBags.it  This test is no t yet approved or cleared by the [place] FDA and  has been authorized for detection and/or diagnosis of SARS-CoV-2 by FDA under an Emergency Use Authorization (EUA). This EUA will remain  in effect (meaning this test can be used) for the duration of the COVID-19 declaration under Section 564(b)(1) of the Act, 21 U.S.C. section 360bbb-3(b)(1), unless the authorization is terminated or revoked sooner.     Influenza A by PCR NEGATIVE NEGATIVE Final   Influenza B by PCR NEGATIVE NEGATIVE Final    Comment: (NOTE) The Xpert Xpress SARS-CoV-2/FLU/RSV assay is intended  as an aid in  the diagnosis of influenza from Nasopharyngeal swab specimens and  should not be used as a sole basis for treatment. Nasal washings and  aspirates are unacceptable for Xpert Xpress SARS-CoV-2/FLU/RSV  testing.  Fact Sheet for Patients: PinkCheek.be  Fact Sheet for Healthcare Providers: GravelBags.it  This test is not yet approved or cleared by the [place] FDA and  has been authorized for detection and/or diagnosis of SARS-CoV-2 by  FDA under an Emergency Use Authorization (EUA). This EUA will remain  in effect (meaning this test can be used) for the duration of the   Covid-19 declaration under Section 564(b)(1) of the Act, 21  U.S.C. section 360bbb-3(b)(1), unless the authorization is  terminated or revoked. Performed at [facility], [address] [place], [place] [address]   MRSA PCR Screening     Status: None   Collection Time: 06/06/20  9:22 PM   Specimen: Nasal Mucosa; Nasopharyngeal  Result Value Ref Range Status   MRSA by PCR NEGATIVE NEGATIVE Final    Comment:        The GeneXpert MRSA Assay (FDA approved for NASAL specimens only), is one component of a comprehensive MRSA colonization surveillance program. It is not intended to diagnose MRSA infection nor to guide or monitor treatment for MRSA infections. Performed at [facility], [address], [place], [place] [address]      Labs: Basic Metabolic Panel: Recent Labs  Lab 06/06/20 1144 06/06/20 1858 06/07/20 0356  NA 122*  --  136  K 4.4  --  3.5  CL 88*  --  101  CO2 19*  --  23  GLUCOSE 846*  --  144*  BUN 55*  --  49*  CREATININE 2.59*  --  2.02*  CALCIUM 8.3*  --  8.5*  MG  --  2.7*  --   PHOS  --  4.7*  --    CBC: Recent Labs  Lab 06/06/20 1144 06/07/20 0356  WBC 32.8* 27.2*  NEUTROABS 23.3*  --   HGB 9.0* 8.2*  HCT 28.0* 25.5*  MCV 116.7* 114.9*  PLT 125* 107*   BNP (last 3 results) Recent Labs    11/01/19 1346 12/12/19 1111  BNP 308.0* 111.8*   CBG: Recent Labs  Lab 06/07/20 0156 06/07/20 0302 06/07/20 0358 06/07/20 0752 06/07/20 1210  GLUCAP 170* 166* 139* 168* 290*    Signed:  Barton Dubois MD.  [facility] 06/07/2020, 3:03 PM

## 2020-06-10 ENCOUNTER — Other Ambulatory Visit: Payer: Self-pay

## 2020-06-10 ENCOUNTER — Inpatient Hospital Stay (HOSPITAL_COMMUNITY): Payer: Medicare HMO

## 2020-06-10 ENCOUNTER — Ambulatory Visit: Payer: Medicare HMO | Admitting: Family Medicine

## 2020-06-10 ENCOUNTER — Encounter: Payer: Self-pay | Admitting: Family Medicine

## 2020-06-10 VITALS — BP 140/68 | HR 66 | Ht 70.0 in | Wt 206.8 lb

## 2020-06-10 VITALS — BP 144/65 | HR 61 | Temp 97.2°F | Resp 18

## 2020-06-10 DIAGNOSIS — I2510 Atherosclerotic heart disease of native coronary artery without angina pectoris: Secondary | ICD-10-CM

## 2020-06-10 DIAGNOSIS — D539 Nutritional anemia, unspecified: Secondary | ICD-10-CM

## 2020-06-10 DIAGNOSIS — I129 Hypertensive chronic kidney disease with stage 1 through stage 4 chronic kidney disease, or unspecified chronic kidney disease: Secondary | ICD-10-CM | POA: Diagnosis not present

## 2020-06-10 DIAGNOSIS — I350 Nonrheumatic aortic (valve) stenosis: Secondary | ICD-10-CM

## 2020-06-10 DIAGNOSIS — E119 Type 2 diabetes mellitus without complications: Secondary | ICD-10-CM

## 2020-06-10 DIAGNOSIS — I5032 Chronic diastolic (congestive) heart failure: Secondary | ICD-10-CM

## 2020-06-10 DIAGNOSIS — I480 Paroxysmal atrial fibrillation: Secondary | ICD-10-CM

## 2020-06-10 DIAGNOSIS — N183 Chronic kidney disease, stage 3 unspecified: Secondary | ICD-10-CM

## 2020-06-10 DIAGNOSIS — N179 Acute kidney failure, unspecified: Secondary | ICD-10-CM

## 2020-06-10 DIAGNOSIS — I10 Essential (primary) hypertension: Secondary | ICD-10-CM | POA: Diagnosis not present

## 2020-06-10 DIAGNOSIS — D508 Other iron deficiency anemias: Secondary | ICD-10-CM

## 2020-06-10 LAB — CBC WITH DIFFERENTIAL/PLATELET
Band Neutrophils: 6 %
Basophils Absolute: 0 10*3/uL (ref 0.0–0.1)
Basophils Relative: 0 %
Eosinophils Absolute: 0.4 10*3/uL (ref 0.0–0.5)
Eosinophils Relative: 2 %
HCT: 26.6 % — ABNORMAL LOW (ref 39.0–52.0)
Hemoglobin: 8.4 g/dL — ABNORMAL LOW (ref 13.0–17.0)
Lymphocytes Relative: 9 %
Lymphs Abs: 1.7 10*3/uL (ref 0.7–4.0)
MCH: 37.7 pg — ABNORMAL HIGH (ref 26.0–34.0)
MCHC: 31.6 g/dL (ref 30.0–36.0)
MCV: 119.3 fL — ABNORMAL HIGH (ref 80.0–100.0)
Metamyelocytes Relative: 26 %
Monocytes Absolute: 3.4 10*3/uL — ABNORMAL HIGH (ref 0.1–1.0)
Monocytes Relative: 18 %
Myelocytes: 2 %
Neutro Abs: 7.7 10*3/uL (ref 1.7–7.7)
Neutrophils Relative %: 35 %
Platelets: 120 10*3/uL — ABNORMAL LOW (ref 150–400)
Promyelocytes Relative: 2 %
RBC: 2.23 MIL/uL — ABNORMAL LOW (ref 4.22–5.81)
RDW: 16.9 % — ABNORMAL HIGH (ref 11.5–15.5)
WBC: 18.9 10*3/uL — ABNORMAL HIGH (ref 4.0–10.5)
nRBC: 0 % (ref 0.0–0.2)

## 2020-06-10 MED ORDER — EPOETIN ALFA-EPBX 40000 UNIT/ML IJ SOLN
40000.0000 [IU] | Freq: Once | INTRAMUSCULAR | Status: AC
  Administered 2020-06-10: 40000 [IU] via SUBCUTANEOUS

## 2020-06-10 MED ORDER — EPOETIN ALFA-EPBX 40000 UNIT/ML IJ SOLN
INTRAMUSCULAR | Status: AC
  Filled 2020-06-10: qty 1

## 2020-06-10 NOTE — Patient Instructions (Addendum)
Medication Instructions:  Continue all current medications.  Labwork: none  Testing/Procedures: none  Follow-Up: 3 months   Any Other Special Instructions Will Be Listed Below (If Applicable).  Can refer to valve clinic and endocrinology if so desired.  Dr. Carolan Clines [facility] [place] office - [phone].    If you need a refill on your cardiac medications before your next appointment, please call your pharmacy.

## 2020-06-10 NOTE — Patient Instructions (Signed)
[facility]  Discharge Instructions:  Epoetin Alfa injection What is this medicine? EPOETIN ALFA (e POE e tin AL fa) helps your body make more red blood cells. This medicine is used to treat anemia caused by chronic kidney disease, cancer chemotherapy, or HIV-therapy. It may also be used before surgery if you have anemia. This medicine may be used for other purposes; ask your health care provider or pharmacist if you have questions. COMMON BRAND NAME(S): Epogen, Procrit, Retacrit What should I tell my health care provider before I take this medicine? They need to know if you have any of these conditions:  cancer  heart disease  high blood pressure  history of blood clots  history of stroke  low levels of folate, iron, or vitamin B12 in the blood  seizures  an unusual or allergic reaction to erythropoietin, albumin, benzyl alcohol, hamster proteins, other medicines, foods, dyes, or preservatives  pregnant or trying to get pregnant  breast-feeding How should I use this medicine? This medicine is for injection into a vein or under the skin. It is usually given by a health care professional in a hospital or clinic setting. If you get this medicine at home, you will be taught how to prepare and give this medicine. Use exactly as directed. Take your medicine at regular intervals. Do not take your medicine more often than directed. It is important that you put your used needles and syringes in a special sharps container. Do not put them in a trash can. If you do not have a sharps container, call your pharmacist or healthcare provider to get one. A special MedGuide will be given to you by the pharmacist with each prescription and refill. Be sure to read this information carefully each time. Talk to your pediatrician regarding the use of this medicine in children. While this drug may be prescribed for selected conditions, precautions do  apply. Overdosage: If you think you have taken too much of this medicine contact a poison control center or emergency room at once. NOTE: This medicine is only for you. Do not share this medicine with others. What if I miss a dose? If you miss a dose, take it as soon as you can. If it is almost time for your next dose, take only that dose. Do not take double or extra doses. What may interact with this medicine? Interactions have not been studied. This list may not describe all possible interactions. Give your health care provider a list of all the medicines, herbs, non-prescription drugs, or dietary supplements you use. Also tell them if you smoke, drink alcohol, or use illegal drugs. Some items may interact with your medicine. What should I watch for while using this medicine? Your condition will be monitored carefully while you are receiving this medicine. You may need blood work done while you are taking this medicine. This medicine may cause a decrease in vitamin B6. You should make sure that you get enough vitamin B6 while you are taking this medicine. Discuss the foods you eat and the vitamins you take with your health care professional. What side effects may I notice from receiving this medicine? Side effects that you should report to your doctor or health care professional as soon as possible:  allergic reactions like skin rash, itching or hives, swelling of the face, lips, or tongue  seizures  signs and symptoms of a blood clot such as breathing problems; changes in vision; chest pain; severe, sudden   headache; pain, swelling, warmth in the leg; trouble speaking; sudden numbness or weakness of the face, arm or leg  signs and symptoms of a stroke like changes in vision; confusion; trouble speaking or understanding; severe headaches; sudden numbness or weakness of the face, arm or leg; trouble walking; dizziness; loss of balance or coordination Side effects that usually do not require  medical attention (report to your doctor or health care professional if they continue or are bothersome):  chills  cough  dizziness  fever  headaches  joint pain  muscle cramps  muscle pain  nausea, vomiting  pain, redness, or irritation at site where injected This list may not describe all possible side effects. Call your doctor for medical advice about side effects. You may report side effects to FDA at 1-800-FDA-1088. Where should I keep my medicine? Keep out of the reach of children. Store in a refrigerator between 2 and 8 degrees C (36 and 46 degrees F). Do not freeze or shake. Throw away any unused portion if using a single-dose vial. Multi-dose vials can be kept in the refrigerator for up to 21 days after the initial dose. Throw away unused medicine. NOTE: This sheet is a summary. It may not cover all possible information. If you have questions about this medicine, talk to your doctor, pharmacist, or health care provider.  2020 Elsevier/Gold Standard (2017-02-19 08:35:19)  _______________________________________________________________  Thank you for choosing [facility] at [facility] to provide your oncology and hematology care.  To afford each patient quality time with our providers, please arrive at least 15 minutes before your scheduled appointment.  You need to re-schedule your appointment if you arrive 10 or more minutes late.  We strive to give you quality time with our providers, and arriving late affects you and other patients whose appointments are after yours.  Also, if you no show three or more times for appointments you may be dismissed from the clinic.  Again, thank you for choosing [facility]. Our hope is that these requests will allow you access to exceptional care and in a timely manner. _______________________________________________________________  If you have questions after your visit, please  contact our office at [phone] between the hours of 8:30 a.m. and 5:00 p.m. Voicemails left after 4:30 p.m. will not be returned until the following business day. _______________________________________________________________  For prescription refill requests, have your pharmacy contact our office. _______________________________________________________________  Recommendations made by the consultant and any test results will be sent to your referring physician. _______________________________________________________________ 

## 2020-06-10 NOTE — Progress Notes (Signed)
Jack Mejia presents today for Retacrit injection. Hemoglobin reviewed prior to administration. VSS. Injection tolerated without incident or complaint. See MAR for details. Patient discharged in satisfactory condition with follow up instructions.

## 2020-06-10 NOTE — Progress Notes (Signed)
Cardiology Office Note  Date: 06/10/2020   ID: ZAKKARY THIBAULT, DOB 1945-10-12, MRN [id number]  PCP:  Erven Colla, DO  Cardiologist:  No primary care provider on file. Electrophysiologist:  None   Chief Complaint: Follow-up dyspnea on exertion, PAF, chronic diastolic heart failure, CAD,  History of Present Illness: Jack Mejia is a 74 y.o. male with a history of dyspnea on exertion, atrial fibrillation (status post DCCV 03/2019), CAD, HLD, HTN, DM 2, chronic diastolic heart failure, CKD stage III.  Last encounter with Dr. Bronson Ing 12/01/2019 via telemedicine.  He had been evaluated in ED on 11/01/2019 for shortness of breath.  He was found to be anemic with hemoglobin of 9.7.  Creatinine was 1.79, BNP was 308.  Troponins were normal.  Chest x-ray with vascular congestion and small bilateral pleural effusions.  He was given Lasix IV.  He stated he would get short of breath when walking 50 to 75 yards.  Was taking torsemide 5 mg p.o. twice daily.  He denied any anginal symptoms and was continue medical therapy with atorvastatin, beta-blocker.  Not on aspirin due to being on systemic anticoagulation with Eliquis.  Chronic exertional dyspnea was stable.  Dr. Bronson Ing stated it was likely due to chronic diastolic heart failure and anemia.  Torsemide was increased to 10 mg p.o. twice daily.  Patient had undergone DCCV on 04/21/2019 he remained on amiodarone, diltiazem and metoprolol.  He was symptomatically stable.  He was continuing Eliquis for anticoagulation.  Creatinine on 11/01/2019 was 1.79.  Blood pressure was elevated.  Patient stated he felt best when systolic blood pressure was 150.  Stated he did not feel well when BP under 784 systolic.  Patient's PCP, Dr. Lovena Le, recently saw him and noted decrease in renal function with creatinine of 2.26.  Patient had been taking torsemide 20 mg p.o. twice daily.  Renal function had been decreasing over the previous few months.  Dr. Lovena Le was  inquiring as to whether torsemide could be decreased due to worsening renal function.  Dr. Harl Bowie replied patient would be seen soon in clinic and if euvolemic may decrease torsemide dosage.  PCP also noted she had referred patient to nephrology.  At last visit patient was still having complaints of shortness of breath.  Recently saw his primary care provider who noted increase in creatinine of 2.26.  He appeared euvolemic.  He continued to complain of dyspnea on exertion which could be multifactorial.  He had been receiving iron infusions by Dr. Delton Coombes for anemia.  Stated breathing was somewhat better but he still had some increased dyspnea on mild activity.  He was not very active on a daily basis due to mobility issues and walked with a cane.  Had a recent hemoglobin A1c of 6.3%.   Denied any classic anginal symptoms but dyspnea could be an anginal equivalent given evidence of CAD.  Denied any orthostatic symptoms, CVA of TIA symptoms.  Admits to some mild orthopnea when lying flat.  Stated he had to sleep on his side.  No claudication-like symptoms, DVT or PE-like symptoms, lower extremity edema.  Recent hospital admission 06/06/2020 due to hyperosmolar hyperglycemic state.  He presented to the hospital with complaints of generalized weakness and increased urinary frequency with decreased appetite.  He was treated with insulin drip.  HHS was corrected.  He was discharged on 06/07/2020.  He received Retacrit injection this morning due for iron deficiency anemia, microcytic anemia, acute renal failure superimposed on stage III CKD.  Patient is here for follow-up to review recent stress test and echocardiogram results.  Stress test was low risk.  Echocardiogram On 05/08/2020 demonstrated EF 65 to 70%, mild LVH, G1 DD, trivial MR, moderate aortic valve stenosis.  Patient's significant other who is with him today stated he went to the kidney clinic [facility] and gave a urine sample.  She  states he left shortly after giving urine sample.  She states there was mention of possible renal scan.  We have no notes from [facility] regarding follow-up.  Significant other states [facility] has not called them back regarding follow-up or next steps.  We talked about the moderate aortic valve stenosis and possible contribution to dyspnea on exertion.  Patient has poorly controlled diabetes with daily blood sugars in the 200 range.  He has been managed by PCP for his diabetes.  Mentioned possible referral to endocrinology and valve clinic for further evaluation of both moderate aortic stenosis and uncontrolled diabetes.  Significant other states due to recent fall patient continues to have significant back pain and hip pain although he had no fractures.  Blood pressure is up today but significant other states it may be due to pain.  She states at home systolic blood pressure usually ranges in the 120s to 130s.  Patient states he is tired of having to run to the bathroom.  States he feels tired and sleepy all the time.  Past Medical History:  Diagnosis Date  . Arthritis   . Asthma   . Atrial fibrillation ([place])    a. s/p DCCV in 03/2019  . CAD (coronary artery disease) 06/29/2019  . Cramps of left lower extremity   . Depression   . Diabetes mellitus    type 2 for 7-8 yrs  . Dyspnea   . Elevated liver enzymes   . Fatty liver   . History of kidney stones   . Hyperlipidemia   . Hypertension   . Renal insufficiency   . Vertigo     Past Surgical History:  Procedure Laterality Date  . CARDIOVERSION N/A 04/21/2019   Procedure: CARDIOVERSION;  Surgeon: Sanda Klein, MD;  Location: [facility];  Service: Cardiovascular;  Laterality: N/A;  . CARPAL TUNNEL RELEASE     both wrist  . cataract surgery     bilateral  . CHOLECYSTECTOMY    . EYE SURGERY    . HEMORROIDECTOMY    . KNEE SURGERY Left   . LEFT HEART CATH AND CORONARY ANGIOGRAPHY N/A 05/23/2019   Procedure:  LEFT HEART CATH AND CORONARY ANGIOGRAPHY;  Surgeon: Belva Crome, MD;  Location: [facility];  Service: Cardiovascular;  Laterality: N/A;  . LUMBAR LAMINECTOMY/DECOMPRESSION MICRODISCECTOMY N/A 08/06/2016   Procedure: LUMBAR THREE- LUMBAR FIVE  DECOMPRESSIVE LUMBAR LAMINECTOMY;  Surgeon: Jovita Gamma, MD;  Location: [facility];  Service: Neurosurgery;  Laterality: N/A;    Current Outpatient Medications  Medication Sig Dispense Refill  . amiodarone (PACERONE) 200 MG tablet TAKE 1 TABLET BY MOUTH EVERY DAY 90 tablet 2  . apixaban (ELIQUIS) 5 MG TABS tablet Take 1 tablet (5 mg total) by mouth 2 (two) times daily. 60 tablet 6  . atorvastatin (LIPITOR) 20 MG tablet Take 1 tablet (20 mg total) by mouth daily. 90 tablet 3  . diltiazem (CARDIZEM CD) 360 MG 24 hr capsule Take 1 capsule (360 mg total) by mouth daily. 90 capsule 1  . empagliflozin (JARDIANCE) 10 MG TABS tablet Take 1 tablet (10 mg total) by mouth  daily before breakfast. 30 tablet 2  . glipiZIDE (GLUCOTROL) 5 MG tablet Take 2 tablets (10 mg total) by mouth 2 (two) times daily. 120 tablet 1  . metoprolol tartrate (LOPRESSOR) 25 MG tablet Take 1 tablet (25 mg total) by mouth 2 (two) times daily. 180 tablet 1  . torsemide (DEMADEX) 20 MG tablet Take 1 tablet (20 mg total) by mouth daily. 30 tablet 11  . traMADol (ULTRAM) 50 MG tablet Take 1 tablet (50 mg total) by mouth every 12 (twelve) hours as needed for severe pain. 12 tablet 0  . Vitamin D, Ergocalciferol, (DRISDOL) 1.25 MG (50000 UNIT) CAPS capsule Take 1 capsule (50,000 Units total) by mouth every 7 (seven) days. 12 capsule 0   No current facility-administered medications for this visit.   Allergies:  Demerol, Vasotec [enalapril], and Lasix [furosemide]   Social History: The patient  reports that he quit smoking about 37 years ago. His smoking use included cigarettes. He has a 80.00 pack-year smoking history. He has never used smokeless tobacco. He reports that he does not drink  alcohol and does not use drugs.   Family History: The patient's family history includes COPD in his brother, sister, and sister; Cancer in his mother; Diabetes in his brother, maternal grandmother, mother, and sister; Healthy in his daughter and daughter; Liver disease in his brother.   ROS:  Please see the history of present illness. Otherwise, complete review of systems is positive for none.  All other systems are reviewed and negative.   Physical Exam: VS:  BP 140/68   Pulse 66   Ht 5\' 10"  (1.778 m)   Wt 206 lb 12.8 oz (93.8 kg)   SpO2 98%   BMI 29.67 kg/m , BMI Body mass index is 29.67 kg/m.  Wt Readings from Last 3 Encounters:  06/10/20 206 lb 12.8 oz (93.8 kg)  06/06/20 200 lb 2.8 oz (90.8 kg)  05/31/20 210 lb (95.3 kg)    General: Patient appears comfortable at rest. Neck: Supple, no elevated JVP or carotid bruits, no thyromegaly. Lungs: Clear to auscultation, nonlabored breathing at rest. Cardiac: Regular rate and rhythm, no S3, systolic murmur 2/6 best heard at right upper sternal border.  Murmur, no pericardial rub. Extremities: No pitting edema, distal pulses 2+. Skin: Warm and dry. Musculoskeletal: No kyphosis. Neuropsychiatric: Alert and oriented x3, affect grossly appropriate.  ECG:  EKG November 01, 2019 sinus bradycardia nonspecific T wave abnormality, prolonged QT QT/QTc 498/476  Recent Labwork: 12/12/2019: BNP 111.8 04/04/2020: ALT 33; AST 28 06/06/2020: Magnesium 2.7; TSH 0.490 06/07/2020: BUN 49; Creatinine, Ser 2.02; Potassium 3.5; Sodium 136 06/10/2020: Hemoglobin 8.4; Platelets 120     Component Value Date/Time   CHOL 173 05/04/2018 1056   TRIG 278 (H) 05/04/2018 1056   HDL 34 (L) 05/04/2018 1056   CHOLHDL 5.1 (H) 05/04/2018 1056   CHOLHDL 4.4 02/21/2014 0703   VLDL 38 02/21/2014 0703   LDLCALC 83 05/04/2018 1056    Other Studies Reviewed Today:  NST 05/08/2020 Study Result  Narrative & Impression   No diagnostic ST segment changes to indicate  ischemia.  Small, mild intensity, fixed basal inferoseptal defect that is most consistent with soft tissue attenuation. No large ischemic territories noted.  This is a low risk study.  Nuclear stress EF: 57%.    Echocardiogram 05/08/2020 IMPRESSIONS  1. Left ventricular ejection fraction, by estimation, is 65 to 70%. The  left ventricle has normal function. The left ventricle has no regional  wall motion abnormalities. There  is mild left ventricular hypertrophy.  Left ventricular diastolic parameters  are consistent with Grade I diastolic dysfunction (impaired relaxation).  2. Right ventricular systolic function is normal. The right ventricular  size is normal. There is mildly elevated pulmonary artery systolic  pressure. The estimated right ventricular systolic pressure is 86.7 mmHg.  3. The mitral valve is grossly normal. Trivial mitral valve  regurgitation.  4. The aortic valve is tricuspid. There is moderate calcification of the  aortic valve. Aortic valve regurgitation is not visualized. Moderate  aortic valve stenosis. Aortic valve mean gradient measures 21.5 mmHg.  Aortic valve Vmax measures 3.08 m/s.  Dimentionless index 0.36.  5. The inferior vena cava is normal in size with greater than 50%  respiratory variability, suggesting right atrial pressure of 3 mmHg.       Cardiac catheterization 05/23/2019:   Normal left main  30 to 40% proximal LAD, first diagonal 50% proximal and 70% distal, and diffuse 70% narrowing in the distal/apical LAD.  First obtuse marginal large in size with proximal segmental 50% narrowing and distal 90% narrowing.  Dominant RCA with 30 to 40% ostial to proximal narrowing, diffuse disease in the ostial to mid PDA 80 to 90%, and 80% segmental proximal second left ventricular branch.  Previous echo documentation of EF greater than 55%. After hydration today LVEDP 26 mmHg.  RECOMMENDATIONS:   Diffuse, predominantly distal coronary  artery disease involving PDA, left ventricular branch, diagonal, distal LAD, and first obtuse marginal. Considering multifocality medical therapy is most appropriate especially in absence of any proximal vessel disease. PCI is possible on the PDA, left ventricular branch, and obtuse marginal but each would require significant contrast and place the patient at risk for acute kidney injury.  Significant elevation in LVEDP and history of orthopnea at home suggest that diastolic heart failure needs therapy with additional diuresis.    Echocardiogram 02/22/2019:  1. The left ventricle has normal systolic function with an ejection  fraction of 60-65%. The cavity size was normal. There is moderately  increased left ventricular wall thickness. Left ventricular diastolic  Doppler parameters are indeterminate.  2. The right ventricle has normal systolic function. The cavity was  normal. There is no increase in right ventricular wall thickness.  3. No evidence of mitral valve stenosis.  4. The aortic valve is tricuspid. Mild thickening of the aortic valve.  Mild calcification of the aortic valve. No stenosis of the aortic valve.  Mild aortic annular calcification noted.  5. The aorta is normal in size and structure.  6. The aortic root is normal in size and structure.  7. Pulmonary hypertension is indeterminate, inadequate TR jet.    Assessment and Plan:   1. CAD in native artery History of CAD with diffuse predominant distal coronary artery disease involving PDA, left ventricular branch, diagonal, distal LAD and, first obtuse marginal.  Cath report 04/2019 stated PCI was possible on PDA left ventricular branch and OM but each would require significant contrast in place the patient at risk for kidney injury.  Complains of increased dyspnea on exertion.  Could be an anginal equivalent based on history of diabetes and pre-existing coronary artery disease.  See cardiac catheterization  report from last year.     2. Chronic diastolic heart failure ([place])  Echo 02/22/2019 showed EF of 60 to 65%.  Moderately increased LV wall thickness.  Decreased torsemide to 20 mg once daily due to decreasing renal function.  Patient complained of increasing dyspnea on exertion and has evidence  of a murmur at right upper sternal border question of possible aortic valve stenosis or regurgitation.  Recent echocardiogram 05/08/2020 EF 65 to 70%.  Mild LVH, G1 DD, trivial MR, moderate aortic valve stenosis.  3. Paroxysmal atrial fibrillation ([facility]) Current pulse is regular at 75.  Continue amiodarone 200 mg daily.  Continue Eliquis 5 mg p.o. twice daily.  Continue Cardizem 360 mg p.o. daily.  Recent low risk stress test 05/08/2020  4. Essential hypertension Blood pressure 140/68 today.  Patient significant other states systolic blood pressures usually in the 120s to 130s at home.  Continue Toprol 25 mg p.o. twice daily.    5. Stage 3 chronic kidney disease, unspecified whether stage 3a or 3b CKD ([place]) At last visit decreased torsemide to 20 mg once a day due to decreased renal function.  PCP note states she was going to refer patient to nephrology.  Recent renal function labs 06/07/2020 with creatinine 2.02, GFR 34.  Patient apparently went to [facility] and states he gave him a urine sample.  Significant other states there was mention of possible renal scan.  Significant other states she has not received a phone call from [facility] for follow-up.  Advised nursing staff to call [facility] to touch base regarding follow-up.  6.  Moderate aortic stenosis Recent echocardiogram showed moderate aortic valve stenosis.  Offered to refer patient to valve clinic due to continuing dyspnea on exertion.  He denies any other symptoms such as dizziness or anginal symptoms.  Patient significant other states she will talk to him and call us back if they choose to be  referred to valve clinic.  7.  Type 2 diabetes uncontrolled Recent hospitalization for HHS.  He was started on insulin drip which resolved the hyperglycemia.  States his blood blood sugars are running around the 200 range at home.  Offered to refer to endocrinology for possible better management.  Significant other wants to talk to the patient and call us back in reference to referral.   Medication Adjustments/Labs and Tests Ordered: Current medicines are reviewed at length with the patient today.  Concerns regarding medicines are outlined above.   Disposition: Follow-up with Domenic Polite or APP 3 months Signed, Levell July, NP 06/10/2020 3:23 PM    [facility] [address], [place], [place] [address] Phone: [phone]; Fax: [phone]

## 2020-06-11 ENCOUNTER — Other Ambulatory Visit: Payer: Self-pay | Admitting: Nephrology

## 2020-06-11 ENCOUNTER — Other Ambulatory Visit: Payer: Self-pay | Admitting: Family Medicine

## 2020-06-11 DIAGNOSIS — N184 Chronic kidney disease, stage 4 (severe): Secondary | ICD-10-CM

## 2020-06-13 ENCOUNTER — Ambulatory Visit: Payer: Medicare HMO | Admitting: Family Medicine

## 2020-06-17 ENCOUNTER — Ambulatory Visit (HOSPITAL_COMMUNITY)
Admission: RE | Admit: 2020-06-17 | Discharge: 2020-06-17 | Disposition: A | Discharge status: Home / Self Care | Payer: Medicare HMO | Source: Ambulatory Visit | Attending: Nephrology | Admitting: Nephrology

## 2020-06-17 ENCOUNTER — Other Ambulatory Visit: Payer: Self-pay

## 2020-06-17 DIAGNOSIS — N184 Chronic kidney disease, stage 4 (severe): Secondary | ICD-10-CM | POA: Diagnosis not present

## 2020-06-18 ENCOUNTER — Encounter: Payer: Self-pay | Admitting: Family Medicine

## 2020-06-18 ENCOUNTER — Ambulatory Visit (INDEPENDENT_AMBULATORY_CARE_PROVIDER_SITE_OTHER): Payer: Medicare HMO | Admitting: Family Medicine

## 2020-06-18 VITALS — BP 110/58 | HR 87 | Temp 97.4°F | Ht 70.0 in | Wt 203.0 lb

## 2020-06-18 DIAGNOSIS — M1A9XX0 Chronic gout, unspecified, without tophus (tophi): Secondary | ICD-10-CM | POA: Diagnosis not present

## 2020-06-18 DIAGNOSIS — E1165 Type 2 diabetes mellitus with hyperglycemia: Secondary | ICD-10-CM

## 2020-06-18 DIAGNOSIS — T380X5A Adverse effect of glucocorticoids and synthetic analogues, initial encounter: Secondary | ICD-10-CM

## 2020-06-18 DIAGNOSIS — R739 Hyperglycemia, unspecified: Secondary | ICD-10-CM

## 2020-06-18 DIAGNOSIS — M79606 Pain in leg, unspecified: Secondary | ICD-10-CM | POA: Diagnosis not present

## 2020-06-18 DIAGNOSIS — E119 Type 2 diabetes mellitus without complications: Secondary | ICD-10-CM | POA: Diagnosis not present

## 2020-06-18 LAB — POCT URINALYSIS DIPSTICK
Spec Grav, UA: >=1.03 — AB (ref 1.010–1.025)
pH, UA: 5 (ref 5.0–8.0)

## 2020-06-18 MED ORDER — TRAMADOL HCL 50 MG PO TABS: 50.0000 mg | ORAL_TABLET | Freq: Two times a day (BID) | ORAL | 0 refills | Status: DC | PRN

## 2020-06-18 MED ORDER — ALLOPURINOL 100 MG PO TABS: 100.0000 mg | ORAL_TABLET | Freq: Every day | ORAL | 2 refills | Status: DC

## 2020-06-18 NOTE — Progress Notes (Signed)
Patient ID: Jack Mejia, male    DOB: 1946/02/07, 75 y.o.   MRN: [id number]   Chief Complaint  Patient presents with  . Hospitalization Follow-up   Subjective:    HPI  Hospitalization follow up for HHS and recent tx with prednisone for gout attack.  Pt was found to have very elevated BG in ER in upper 800s. Pt had a fall and hurt his hip prior to the er visit.  Since dc from hosp pt has had headache for 2 days and fatigue for awhile.  Took 2 tylenol tab yesterday and ice pack.   States kidney doctor wanted to have urine sent to lab but have been unable to give them sample. Wanted to give urine sample today.   They recommended allopurinol from hospital for his gout and to avoid prednisone. They gave insulin to bring it down. Didn't send home on insulin.  Taking jardiance and glipizide for diabetes. Seeing 135-165 since being home bg. Not drinking soda or sugary drinks. Watching diet better. Not eating much carbs or sugary snacks.  Has h/o anemia and seeing hem/onc.  CKD- Seeing 1/22- f/u with kidney,  Next week with hem/onc. And getting another injection for anemia.  Labs after discharge- Need reck vit d in 12 wks, and bmp and kidney in 2 wk.  And cont to check bg . A1c rechekc in 3 months.  Pt upset about all the doctor appt and specialist referrals and stating just wanting to go to the "funeral home."  Not handling all the chronic medical illnesses very well.  Pt stating he's "feeling bad all over."  Specifically in his legs.  No new falls since the hospitalization. Walking with a cane. Feeling he's getting worse since being home from hosp. And feeling cold all the time, pt has h/o anemia and getting infusions from heme/onc.   Results for orders placed or performed in visit on 06/18/20  Microalbumin, urine  Result Value Ref Range   Microalbumin, Urine 116.5 Not Estab. ug/mL  Basic metabolic panel  Result Value Ref Range   Glucose 190 (H) 65 - 99 mg/dL    BUN 19 8 - 27 mg/dL   Creatinine, Ser 2.04 (H) 0.76 - 1.27 mg/dL   GFR calc non Af Amer 31 (L) >59 mL/min/1.73   GFR calc Af Amer 36 (L) >59 mL/min/1.73   BUN/Creatinine Ratio 9 (L) 10 - 24   Sodium 139 134 - 144 mmol/L   Potassium 4.0 3.5 - 5.2 mmol/L   Chloride 99 96 - 106 mmol/L   CO2 20 20 - 29 mmol/L   Calcium 8.6 8.6 - 10.2 mg/dL  POCT urinalysis dipstick  Result Value Ref Range   Color, UA     Clarity, UA     Glucose, UA     Bilirubin, UA     Ketones, UA     Spec Grav, UA >=1.030 (A) 1.010 - 1.025   Blood, UA     pH, UA 5.0 5.0 - 8.0   Protein, UA     Urobilinogen, UA     Nitrite, UA     Leukocytes, UA     Appearance     Odor       Medical History Jack Mejia has a past medical history of Arthritis, Asthma, Atrial fibrillation ([place]), CAD (coronary artery disease) (06/29/2019), Cramps of left lower extremity, Depression, Diabetes mellitus, Dyspnea, Elevated liver enzymes, Fatty liver, History of kidney stones, Hyperlipidemia, Hypertension, Renal insufficiency, and Vertigo.   Outpatient  Encounter Medications as of 06/18/2020  Medication Sig  . allopurinol (ZYLOPRIM) 100 MG tablet Take 1 tablet (100 mg total) by mouth daily. For gout  . amiodarone (PACERONE) 200 MG tablet TAKE 1 TABLET BY MOUTH EVERY DAY  . apixaban (ELIQUIS) 5 MG TABS tablet Take 1 tablet (5 mg total) by mouth 2 (two) times daily.  [name] atorvastatin (LIPITOR) 20 MG tablet Take 1 tablet (20 mg total) by mouth daily.  [name] diltiazem (CARDIZEM CD) 360 MG 24 hr capsule Take 1 capsule (360 mg total) by mouth daily.  . empagliflozin (JARDIANCE) 10 MG TABS tablet Take 1 tablet (10 mg total) by mouth daily before breakfast.  . glipiZIDE (GLUCOTROL) 5 MG tablet Take 2 tablets (10 mg total) by mouth 2 (two) times daily.  . metoprolol tartrate (LOPRESSOR) 25 MG tablet Take 1 tablet (25 mg total) by mouth 2 (two) times daily.  [name] torsemide (DEMADEX) 20 MG tablet Take 1 tablet (20 mg total) by mouth daily.  . traMADol  (ULTRAM) 50 MG tablet Take 1 tablet (50 mg total) by mouth every 12 (twelve) hours as needed for severe pain.  . Vitamin D, Ergocalciferol, (DRISDOL) 1.25 MG (50000 UNIT) CAPS capsule Take 1 capsule (50,000 Units total) by mouth every 7 (seven) days.  . [DISCONTINUED] traMADol (ULTRAM) 50 MG tablet Take 1 tablet (50 mg total) by mouth every 12 (twelve) hours as needed for severe pain.   No facility-administered encounter medications on file as of 06/18/2020.     Review of Systems  Constitutional: Positive for fatigue. Negative for chills and fever.  HENT: Negative for congestion, rhinorrhea and sore throat.   Respiratory: Negative for cough, shortness of breath and wheezing.   Cardiovascular: Negative for chest pain and leg swelling.  Gastrointestinal: Negative for abdominal pain, diarrhea, nausea and vomiting.  Genitourinary: Negative for dysuria and frequency.  Musculoskeletal:       +bilateral leg pain.  Skin: Negative for rash.  Neurological: Positive for headaches. Negative for dizziness and weakness.     Vitals BP (!) 110/58   Pulse 87   Temp (!) 97.4 F (36.3 C)   Ht 5\' 10"  (1.778 m)   Wt 203 lb (92.1 kg)   SpO2 96%   BMI 29.13 kg/m   Objective:   Physical Exam Vitals and nursing note reviewed.  Constitutional:      General: He is not in acute distress.    Appearance: Normal appearance. He is not ill-appearing.  HENT:     Head: Normocephalic.     Mouth/Throat:     Mouth: Mucous membranes are moist.  Eyes:     Extraocular Movements: Extraocular movements intact.     Conjunctiva/sclera: Conjunctivae normal.     Pupils: Pupils are equal, round, and reactive to light.  Cardiovascular:     Rate and Rhythm: Normal rate and regular rhythm.     Pulses: Normal pulses.     Heart sounds: Normal heart sounds. No murmur heard.   Pulmonary:     Effort: Pulmonary effort is normal. No respiratory distress.     Breath sounds: Normal breath sounds. No wheezing, rhonchi or  rales.  Musculoskeletal:        General: Normal range of motion.     Right lower leg: No edema.     Left lower leg: No edema.  Skin:    General: Skin is warm and dry.     Coloration: Skin is pale.     Findings: No rash.  Neurological:  General: No focal deficit present.     Mental Status: He is alert and oriented to person, place, and time.     Cranial Nerves: No cranial nerve deficit.  Psychiatric:        Mood and Affect: Mood normal.        Behavior: Behavior normal.        Thought Content: Thought content normal.        Judgment: Judgment normal.      Assessment and Plan   1. Uncontrolled type 2 diabetes mellitus with hyperglycemia (HCC) - POCT urinalysis dipstick - Microalbumin, urine - Basic metabolic panel  2. Chronic gout without tophus, unspecified cause, unspecified site - traMADol (ULTRAM) 50 MG tablet; Take 1 tablet (50 mg total) by mouth every 12 (twelve) hours as needed for severe pain.  Dispense: 30 tablet; Refill: 0 - allopurinol (ZYLOPRIM) 100 MG tablet; Take 1 tablet (100 mg total) by mouth daily. For gout  Dispense: 30 tablet; Refill: 2  3. Pain of lower extremity, unspecified laterality - traMADol (ULTRAM) 50 MG tablet; Take 1 tablet (50 mg total) by mouth every 12 (twelve) hours as needed for severe pain.  Dispense: 30 tablet; Refill: 0  4. Steroid-induced hyperglycemia   DM2- improving.  Pt not to take prednisone for gout attacks.  Pt started on allopurinol. Pt to continue to check bg 2x per day. Cont to take diabetic meds daily. Pt reported he can afford all his medications. -labs ordered to recheck renal function and glucose.  Gout- started on allopurinol, may need to increase on next visit.  Bilateral leg pain- cont with tylenol and tramadol prn.  F/u 1 mo for recheck on pain and diabetes.

## 2020-06-19 LAB — BASIC METABOLIC PANEL
BUN/Creatinine Ratio: 9 — ABNORMAL LOW (ref 10–24)
BUN: 19 mg/dL (ref 8–27)
CO2: 20 mmol/L (ref 20–29)
Calcium: 8.6 mg/dL (ref 8.6–10.2)
Chloride: 99 mmol/L (ref 96–106)
Creatinine, Ser: 2.04 mg/dL — ABNORMAL HIGH (ref 0.76–1.27)
GFR calc Af Amer: 36 mL/min/{1.73_m2} — ABNORMAL LOW (ref >59)
GFR calc non Af Amer: 31 mL/min/{1.73_m2} — ABNORMAL LOW (ref >59)
Glucose: 190 mg/dL — ABNORMAL HIGH (ref 65–99)
Potassium: 4 mmol/L (ref 3.5–5.2)
Sodium: 139 mmol/L (ref 134–144)

## 2020-06-19 LAB — MICROALBUMIN, URINE: Microalbumin, Urine: 116.5 ug/mL

## 2020-06-24 ENCOUNTER — Ambulatory Visit (HOSPITAL_COMMUNITY): Payer: Medicare HMO

## 2020-06-24 ENCOUNTER — Other Ambulatory Visit (HOSPITAL_COMMUNITY): Payer: Medicare HMO

## 2020-06-24 ENCOUNTER — Telehealth: Payer: Self-pay | Admitting: *Deleted

## 2020-06-24 ENCOUNTER — Emergency Department (HOSPITAL_COMMUNITY): Payer: Medicare HMO

## 2020-06-24 ENCOUNTER — Other Ambulatory Visit: Payer: Self-pay

## 2020-06-24 ENCOUNTER — Encounter (HOSPITAL_COMMUNITY): Payer: Self-pay

## 2020-06-24 ENCOUNTER — Ambulatory Visit (HOSPITAL_COMMUNITY): Payer: Medicare HMO | Admitting: Hematology

## 2020-06-24 ENCOUNTER — Encounter (HOSPITAL_COMMUNITY): Payer: Self-pay | Admitting: Emergency Medicine

## 2020-06-24 ENCOUNTER — Inpatient Hospital Stay (HOSPITAL_COMMUNITY)
Admission: EM | Admit: 2020-06-24 | Discharge: 2020-07-10 | DRG: 871 | Disposition: A | Discharge status: Skilled Nursing Facility | Payer: Medicare HMO | Attending: Internal Medicine | Admitting: Internal Medicine

## 2020-06-24 DIAGNOSIS — R0600 Dyspnea, unspecified: Secondary | ICD-10-CM | POA: Diagnosis not present

## 2020-06-24 DIAGNOSIS — Z515 Encounter for palliative care: Secondary | ICD-10-CM | POA: Diagnosis not present

## 2020-06-24 DIAGNOSIS — R600 Localized edema: Secondary | ICD-10-CM | POA: Diagnosis not present

## 2020-06-24 DIAGNOSIS — K760 Fatty (change of) liver, not elsewhere classified: Secondary | ICD-10-CM | POA: Diagnosis present

## 2020-06-24 DIAGNOSIS — J189 Pneumonia, unspecified organism: Secondary | ICD-10-CM | POA: Diagnosis not present

## 2020-06-24 DIAGNOSIS — R6520 Severe sepsis without septic shock: Secondary | ICD-10-CM | POA: Diagnosis present

## 2020-06-24 DIAGNOSIS — J80 Acute respiratory distress syndrome: Secondary | ICD-10-CM | POA: Diagnosis not present

## 2020-06-24 DIAGNOSIS — Z79899 Other long term (current) drug therapy: Secondary | ICD-10-CM

## 2020-06-24 DIAGNOSIS — Z87891 Personal history of nicotine dependence: Secondary | ICD-10-CM

## 2020-06-24 DIAGNOSIS — I350 Nonrheumatic aortic (valve) stenosis: Secondary | ICD-10-CM | POA: Diagnosis present

## 2020-06-24 DIAGNOSIS — N1832 Chronic kidney disease, stage 3b: Secondary | ICD-10-CM | POA: Diagnosis present

## 2020-06-24 DIAGNOSIS — I480 Paroxysmal atrial fibrillation: Secondary | ICD-10-CM | POA: Diagnosis not present

## 2020-06-24 DIAGNOSIS — R7989 Other specified abnormal findings of blood chemistry: Secondary | ICD-10-CM

## 2020-06-24 DIAGNOSIS — E1165 Type 2 diabetes mellitus with hyperglycemia: Secondary | ICD-10-CM | POA: Diagnosis present

## 2020-06-24 DIAGNOSIS — N481 Balanitis: Secondary | ICD-10-CM | POA: Diagnosis present

## 2020-06-24 DIAGNOSIS — J45909 Unspecified asthma, uncomplicated: Secondary | ICD-10-CM | POA: Diagnosis present

## 2020-06-24 DIAGNOSIS — K644 Residual hemorrhoidal skin tags: Secondary | ICD-10-CM | POA: Diagnosis present

## 2020-06-24 DIAGNOSIS — N179 Acute kidney failure, unspecified: Secondary | ICD-10-CM | POA: Diagnosis not present

## 2020-06-24 DIAGNOSIS — J9601 Acute respiratory failure with hypoxia: Secondary | ICD-10-CM | POA: Diagnosis present

## 2020-06-24 DIAGNOSIS — I130 Hypertensive heart and chronic kidney disease with heart failure and stage 1 through stage 4 chronic kidney disease, or unspecified chronic kidney disease: Secondary | ICD-10-CM | POA: Diagnosis not present

## 2020-06-24 DIAGNOSIS — I2510 Atherosclerotic heart disease of native coronary artery without angina pectoris: Secondary | ICD-10-CM | POA: Diagnosis present

## 2020-06-24 DIAGNOSIS — I10 Essential (primary) hypertension: Secondary | ICD-10-CM | POA: Diagnosis present

## 2020-06-24 DIAGNOSIS — Z66 Do not resuscitate: Secondary | ICD-10-CM | POA: Diagnosis not present

## 2020-06-24 DIAGNOSIS — E876 Hypokalemia: Secondary | ICD-10-CM | POA: Diagnosis present

## 2020-06-24 DIAGNOSIS — J188 Other pneumonia, unspecified organism: Secondary | ICD-10-CM | POA: Diagnosis present

## 2020-06-24 DIAGNOSIS — D62 Acute posthemorrhagic anemia: Secondary | ICD-10-CM

## 2020-06-24 DIAGNOSIS — E1122 Type 2 diabetes mellitus with diabetic chronic kidney disease: Secondary | ICD-10-CM | POA: Diagnosis present

## 2020-06-24 DIAGNOSIS — J984 Other disorders of lung: Secondary | ICD-10-CM | POA: Diagnosis not present

## 2020-06-24 DIAGNOSIS — K921 Melena: Secondary | ICD-10-CM | POA: Diagnosis present

## 2020-06-24 DIAGNOSIS — J9690 Respiratory failure, unspecified, unspecified whether with hypoxia or hypercapnia: Secondary | ICD-10-CM | POA: Diagnosis not present

## 2020-06-24 DIAGNOSIS — E785 Hyperlipidemia, unspecified: Secondary | ICD-10-CM | POA: Diagnosis present

## 2020-06-24 DIAGNOSIS — J181 Lobar pneumonia, unspecified organism: Secondary | ICD-10-CM | POA: Diagnosis present

## 2020-06-24 DIAGNOSIS — R609 Edema, unspecified: Secondary | ICD-10-CM

## 2020-06-24 DIAGNOSIS — T462X5A Adverse effect of other antidysrhythmic drugs, initial encounter: Secondary | ICD-10-CM | POA: Diagnosis not present

## 2020-06-24 DIAGNOSIS — T380X5A Adverse effect of glucocorticoids and synthetic analogues, initial encounter: Secondary | ICD-10-CM | POA: Diagnosis not present

## 2020-06-24 DIAGNOSIS — I509 Heart failure, unspecified: Secondary | ICD-10-CM | POA: Diagnosis not present

## 2020-06-24 DIAGNOSIS — Z20822 Contact with and (suspected) exposure to covid-19: Secondary | ICD-10-CM | POA: Diagnosis not present

## 2020-06-24 DIAGNOSIS — J811 Chronic pulmonary edema: Secondary | ICD-10-CM | POA: Diagnosis not present

## 2020-06-24 DIAGNOSIS — I5030 Unspecified diastolic (congestive) heart failure: Secondary | ICD-10-CM

## 2020-06-24 DIAGNOSIS — Z794 Long term (current) use of insulin: Secondary | ICD-10-CM

## 2020-06-24 DIAGNOSIS — I517 Cardiomegaly: Secondary | ICD-10-CM | POA: Diagnosis not present

## 2020-06-24 DIAGNOSIS — I5033 Acute on chronic diastolic (congestive) heart failure: Secondary | ICD-10-CM | POA: Diagnosis present

## 2020-06-24 DIAGNOSIS — Z7901 Long term (current) use of anticoagulants: Secondary | ICD-10-CM

## 2020-06-24 DIAGNOSIS — Z9049 Acquired absence of other specified parts of digestive tract: Secondary | ICD-10-CM

## 2020-06-24 DIAGNOSIS — R0602 Shortness of breath: Secondary | ICD-10-CM | POA: Diagnosis not present

## 2020-06-24 DIAGNOSIS — N183 Chronic kidney disease, stage 3 unspecified: Secondary | ICD-10-CM | POA: Diagnosis not present

## 2020-06-24 DIAGNOSIS — R0689 Other abnormalities of breathing: Secondary | ICD-10-CM

## 2020-06-24 DIAGNOSIS — A419 Sepsis, unspecified organism: Principal | ICD-10-CM | POA: Diagnosis present

## 2020-06-24 DIAGNOSIS — E1121 Type 2 diabetes mellitus with diabetic nephropathy: Secondary | ICD-10-CM | POA: Diagnosis present

## 2020-06-24 DIAGNOSIS — F320 Major depressive disorder, single episode, mild: Secondary | ICD-10-CM | POA: Diagnosis present

## 2020-06-24 DIAGNOSIS — Z7984 Long term (current) use of oral hypoglycemic drugs: Secondary | ICD-10-CM

## 2020-06-24 DIAGNOSIS — L8990 Pressure ulcer of unspecified site, unspecified stage: Secondary | ICD-10-CM | POA: Insufficient documentation

## 2020-06-24 DIAGNOSIS — Z7189 Other specified counseling: Secondary | ICD-10-CM | POA: Diagnosis not present

## 2020-06-24 DIAGNOSIS — D7589 Other specified diseases of blood and blood-forming organs: Secondary | ICD-10-CM | POA: Diagnosis present

## 2020-06-24 DIAGNOSIS — J90 Pleural effusion, not elsewhere classified: Secondary | ICD-10-CM | POA: Diagnosis not present

## 2020-06-24 DIAGNOSIS — K625 Hemorrhage of anus and rectum: Secondary | ICD-10-CM | POA: Diagnosis not present

## 2020-06-24 DIAGNOSIS — E872 Acidosis: Secondary | ICD-10-CM | POA: Diagnosis not present

## 2020-06-24 DIAGNOSIS — H9193 Unspecified hearing loss, bilateral: Secondary | ICD-10-CM | POA: Diagnosis present

## 2020-06-24 DIAGNOSIS — N189 Chronic kidney disease, unspecified: Secondary | ICD-10-CM | POA: Diagnosis present

## 2020-06-24 LAB — CBC WITH DIFFERENTIAL/PLATELET
Abs Immature Granulocytes: 1.81 10*3/uL — ABNORMAL HIGH (ref 0.00–0.07)
Basophils Absolute: 0.1 10*3/uL (ref 0.0–0.1)
Basophils Relative: 0 %
Eosinophils Absolute: 0 10*3/uL (ref 0.0–0.5)
Eosinophils Relative: 0 %
HCT: 25.1 % — ABNORMAL LOW (ref 39.0–52.0)
Hemoglobin: 7.5 g/dL — ABNORMAL LOW (ref 13.0–17.0)
Immature Granulocytes: 7 %
Lymphocytes Relative: 5 %
Lymphs Abs: 1.4 10*3/uL (ref 0.7–4.0)
MCH: 36.2 pg — ABNORMAL HIGH (ref 26.0–34.0)
MCHC: 29.9 g/dL — ABNORMAL LOW (ref 30.0–36.0)
MCV: 121.3 fL — ABNORMAL HIGH (ref 80.0–100.0)
Monocytes Absolute: 2.7 10*3/uL — ABNORMAL HIGH (ref 0.1–1.0)
Monocytes Relative: 10 %
Neutro Abs: 20.2 10*3/uL — ABNORMAL HIGH (ref 1.7–7.7)
Neutrophils Relative %: 78 %
Platelets: 239 10*3/uL (ref 150–400)
RBC: 2.07 MIL/uL — ABNORMAL LOW (ref 4.22–5.81)
RDW: 16.1 % — ABNORMAL HIGH (ref 11.5–15.5)
WBC: 26.2 10*3/uL — ABNORMAL HIGH (ref 4.0–10.5)
nRBC: 0.1 % (ref 0.0–0.2)

## 2020-06-24 LAB — COMPREHENSIVE METABOLIC PANEL
ALT: 130 U/L — ABNORMAL HIGH (ref 0–44)
AST: 147 U/L — ABNORMAL HIGH (ref 15–41)
Albumin: 2.9 g/dL — ABNORMAL LOW (ref 3.5–5.0)
Alkaline Phosphatase: 146 U/L — ABNORMAL HIGH (ref 38–126)
Anion gap: 16 — ABNORMAL HIGH (ref 5–15)
BUN: 46 mg/dL — ABNORMAL HIGH (ref 8–23)
CO2: 20 mmol/L — ABNORMAL LOW (ref 22–32)
Calcium: 8.6 mg/dL — ABNORMAL LOW (ref 8.9–10.3)
Chloride: 99 mmol/L (ref 98–111)
Creatinine, Ser: 2.51 mg/dL — ABNORMAL HIGH (ref 0.61–1.24)
GFR, Estimated: 26 mL/min — ABNORMAL LOW (ref >60)
Glucose, Bld: 405 mg/dL — ABNORMAL HIGH (ref 70–99)
Potassium: 4.1 mmol/L (ref 3.5–5.1)
Sodium: 135 mmol/L (ref 135–145)
Total Bilirubin: 2.1 mg/dL — ABNORMAL HIGH (ref 0.3–1.2)
Total Protein: 7.4 g/dL (ref 6.5–8.1)

## 2020-06-24 LAB — BLOOD GAS, ARTERIAL
Acid-base deficit: 1.7 mmol/L (ref 0.0–2.0)
Bicarbonate: 23.2 mmol/L (ref 20.0–28.0)
FIO2: 100
O2 Saturation: 98 %
Patient temperature: 36.6
pCO2 arterial: 28.6 mmHg — ABNORMAL LOW (ref 32.0–48.0)
pH, Arterial: 7.484 — ABNORMAL HIGH (ref 7.350–7.450)
pO2, Arterial: 94.7 mmHg (ref 83.0–108.0)

## 2020-06-24 LAB — RESP PANEL BY RT-PCR (FLU A&B, COVID) ARPGX2
Influenza A by PCR: NEGATIVE
Influenza B by PCR: NEGATIVE
SARS Coronavirus 2 by RT PCR: NEGATIVE

## 2020-06-24 LAB — BRAIN NATRIURETIC PEPTIDE: B Natriuretic Peptide: 883 pg/mL — ABNORMAL HIGH (ref 0.0–100.0)

## 2020-06-24 LAB — LACTIC ACID, PLASMA
Lactic Acid, Venous: 3.4 mmol/L (ref 0.5–1.9)
Lactic Acid, Venous: 1.7 mmol/L (ref 0.5–1.9)
Lactic Acid, Venous: 1.6 mmol/L (ref 0.5–1.9)

## 2020-06-24 LAB — PROTIME-INR
INR: 2.7 — ABNORMAL HIGH (ref 0.8–1.2)
Prothrombin Time: 27.4 seconds — ABNORMAL HIGH (ref 11.4–15.2)

## 2020-06-24 MED ORDER — VANCOMYCIN HCL 2000 MG/400ML IV SOLN
2000.0000 mg | Freq: Once | INTRAVENOUS | Status: AC
  Administered 2020-06-24: 2000 mg via INTRAVENOUS
  Filled 2020-06-24: qty 400

## 2020-06-24 MED ORDER — FUROSEMIDE 10 MG/ML IJ SOLN: 40.0000 mg | Freq: Once | INTRAMUSCULAR | Status: DC

## 2020-06-24 MED ORDER — SODIUM CHLORIDE 0.9 % IV BOLUS (SEPSIS)
1000.0000 mL | Freq: Once | INTRAVENOUS | Status: AC
  Administered 2020-06-24: 1000 mL via INTRAVENOUS

## 2020-06-24 MED ORDER — LORAZEPAM 2 MG/ML IJ SOLN
1.0000 mg | Freq: Once | INTRAMUSCULAR | Status: AC
  Administered 2020-06-24: 1 mg via INTRAVENOUS
  Filled 2020-06-24: qty 1

## 2020-06-24 MED ORDER — VANCOMYCIN HCL 1750 MG/350ML IV SOLN: 1750.0000 mg | INTRAVENOUS | Status: DC

## 2020-06-24 MED ORDER — LACTATED RINGERS IV SOLN: INTRAVENOUS | Status: DC

## 2020-06-24 MED ORDER — SODIUM CHLORIDE 0.9 % IV SOLN
2.0000 g | Freq: Two times a day (BID) | INTRAVENOUS | Status: DC
  Administered 2020-06-24 – 2020-06-25 (×2): 2 g via INTRAVENOUS
  Filled 2020-06-24 (×2): qty 2

## 2020-06-24 NOTE — H&P (Addendum)
History and Physical    Jack Mejia TMH:[id number] DOB: [birth year] DOA: 06/24/2020  PCP: Erven Colla, DO   Patient coming from: Home  I have personally briefly reviewed patient's old medical records in [facility]  Chief Complaint:.  Difficulty breathing, bleeding from penis.  HPI: Jack Mejia is a 74 y.o. male with medical history significant for paroxysmal atrial fibrillation on anticoagulation, CHF,, CKD 3, hypertension.  History is obtained from chart review and EDP as at the time of my evaluation patient is on BiPAP. Patient presented to the ED with complaints of difficulty breathing over the past month that significantly worsened over the past week.  Patient also has swelling in his lower extremities but this is not new.  He has been taking Demadex at home.  He also noticed blood in his depends, but he denied blood in his urine.  Recent hospitalization 11/11-11/12 for hyperosmolar hyperglycemic state, treated with insulin drip insulin drip.  ED Course: Temperature 97.8.  O2 sats 82% on room air, placed on 7 L high flow nasal cannula and subsequently transitioned to BiPAP due to increased work of breathing.  Blood pressure systolic 921-194.  Tachypneic to 32.  WBC 26.2.  Lactic acid 3.4>> 1.6. BNP elevated- 883, ABG showed 148, PCO2 of 28. ,  PO2 94.  Portable chest x-ray shows patchy bilateral airspace opacities concerning for multifocal pneumonia.  Vancomycin and cefepime started in the ED.  3 L bolus given.  Hospitalist to admit for multifocal pneumonia with sepsis.  Review of Systems: Unable to assess patient on BiPAP.  Past Medical History:  Diagnosis Date  . Arthritis   . Asthma   . Atrial fibrillation ([place])    a. s/p DCCV in 03/2019  . CAD (coronary artery disease) 06/29/2019  . Cramps of left lower extremity   . Depression   . Diabetes mellitus    type 2 for 7-8 yrs  . Dyspnea   . Elevated liver enzymes   . Fatty liver   . History of kidney stones    . Hyperlipidemia   . Hypertension   . Renal insufficiency   . Vertigo     Past Surgical History:  Procedure Laterality Date  . CARDIOVERSION N/A 04/21/2019   Procedure: CARDIOVERSION;  Surgeon: Sanda Klein, MD;  Location: [facility];  Service: Cardiovascular;  Laterality: N/A;  . CARPAL TUNNEL RELEASE     both wrist  . cataract surgery     bilateral  . CHOLECYSTECTOMY    . EYE SURGERY    . HEMORROIDECTOMY    . KNEE SURGERY Left   . LEFT HEART CATH AND CORONARY ANGIOGRAPHY N/A 05/23/2019   Procedure: LEFT HEART CATH AND CORONARY ANGIOGRAPHY;  Surgeon: Belva Crome, MD;  Location: [facility];  Service: Cardiovascular;  Laterality: N/A;  . LUMBAR LAMINECTOMY/DECOMPRESSION MICRODISCECTOMY N/A 08/06/2016   Procedure: LUMBAR THREE- LUMBAR FIVE  DECOMPRESSIVE LUMBAR LAMINECTOMY;  Surgeon: Jovita Gamma, MD;  Location: [facility];  Service: Neurosurgery;  Laterality: N/A;     reports that he quit smoking about 37 years ago. His smoking use included cigarettes. He has a 80.00 pack-year smoking history. He has never used smokeless tobacco. He reports that he does not drink alcohol and does not use drugs.  Allergies  Allergen Reactions  . Demerol Nausea And Vomiting  . Vasotec [Enalapril] Cough  . Lasix [Furosemide] Rash    Family History  Problem Relation Age of Onset  . Diabetes Mother   . Cancer  Mother        unknown kind  . Diabetes Sister   . COPD Sister   . Liver disease Brother   . COPD Brother   . Diabetes Maternal Grandmother   . Diabetes Brother   . COPD Sister   . Healthy Daughter   . Healthy Daughter     Prior to Admission medications   Medication Sig Start Date End Date Taking? Authorizing Provider  acetaminophen (TYLENOL) 650 MG CR tablet Take 650 mg by mouth every 8 (eight) hours as needed for pain.   Yes [provider]  amiodarone (PACERONE) 200 MG tablet TAKE 1 TABLET BY MOUTH EVERY DAY 12/05/19  Yes Herminio Commons, MD  apixaban  (ELIQUIS) 5 MG TABS tablet Take 1 tablet (5 mg total) by mouth 2 (two) times daily. 10/03/19  Yes Herminio Commons, MD  atorvastatin (LIPITOR) 20 MG tablet Take 1 tablet (20 mg total) by mouth daily. 10/03/19 09/27/20 Yes Herminio Commons, MD  diltiazem (CARDIZEM CD) 360 MG 24 hr capsule Take 1 capsule (360 mg total) by mouth daily. 04/22/20  Yes Strader, [place], PA-C  empagliflozin (JARDIANCE) 10 MG TABS tablet Take 1 tablet (10 mg total) by mouth daily before breakfast. 04/23/20  Yes Lovena Le, Malena M, DO  glipiZIDE (GLUCOTROL) 5 MG tablet Take 2 tablets (10 mg total) by mouth 2 (two) times daily. 04/22/20  Yes Lovena Le, Malena M, DO  ibuprofen (ADVIL) 200 MG tablet Take 200 mg by mouth every 6 (six) hours as needed.   Yes [provider]  metoprolol tartrate (LOPRESSOR) 25 MG tablet Take 1 tablet (25 mg total) by mouth 2 (two) times daily. 04/30/20  Yes Lovena Le, Malena M, DO  torsemide (DEMADEX) 20 MG tablet Take 1 tablet (20 mg total) by mouth daily. 05/31/20 05/26/21 Yes Verta Ellen., NP  traMADol (ULTRAM) 50 MG tablet Take 1 tablet (50 mg total) by mouth every 12 (twelve) hours as needed for severe pain. 06/18/20 06/18/21 Yes Lovena Le, Malena M, DO  Vitamin D, Ergocalciferol, (DRISDOL) 1.25 MG (50000 UNIT) CAPS capsule Take 1 capsule (50,000 Units total) by mouth every 7 (seven) days. 06/07/20  Yes Barton Dubois, MD  allopurinol (ZYLOPRIM) 100 MG tablet Take 1 tablet (100 mg total) by mouth daily. For gout 06/18/20   Erven Colla, DO    Physical Exam: Exam limited patient on BiPAP Vitals:   06/24/20 1700 06/24/20 1730 06/24/20 1800 06/24/20 1830  BP: (!) 105/55 129/89 139/70 137/64  Pulse: 72 70 72 70  Resp: (!) 29 (!) 21 (!) 25 20  Temp:      TempSrc:      SpO2: 100% 100% 100% 100%  Weight:      Height:        Constitutional: On BiPAP Vitals:   06/24/20 1700 06/24/20 1730 06/24/20 1800 06/24/20 1830  BP: (!) 105/55 129/89 139/70 137/64  Pulse: 72 70 72 70  Resp:  (!) 29 (!) 21 (!) 25 20  Temp:      TempSrc:      SpO2: 100% 100% 100% 100%  Weight:      Height:       Eyes: PERRL, lids and conjunctivae normal ENMT: On BiPAP Neck: normal, supple, no masses, no thyromegaly Respiratory: On BiPAP, clear to auscultation bilaterally, no wheezing, no crackles. Normal respiratory effort. No accessory muscle use.  Cardiovascular: Regular rate and rhythm, no murmurs / rubs / gallops.  1+ bilateral lower extremity edema to mid leg  2+ pedal pulses.  Abdomen: no tenderness, no masses palpated. No hepatosplenomegaly. Bowel sounds positive.  Musculoskeletal: no clubbing / cyanosis. No joint deformity upper and lower extremities. Good ROM, no contractures. Normal muscle tone.  Skin: no rashes, lesions, ulcers. No induration Neurologic: No apparent cranial abnormality, moving extremities spontaneously.[name]  Psychiatric: Normal judgment and insight. Alert and oriented x 3. Normal mood.   Labs on Admission: I have personally reviewed following labs and imaging studies  CBC: Recent Labs  Lab 06/24/20 1402  WBC 26.2*  NEUTROABS 20.2*  HGB 7.5*  HCT 25.1*  MCV 121.3*  PLT 355   Basic Metabolic Panel: Recent Labs  Lab 06/18/20 1122 06/24/20 1402  NA 139 135  K 4.0 4.1  CL 99 99  CO2 20 20*  GLUCOSE 190* 405*  BUN 19 46*  CREATININE 2.04* 2.51*  CALCIUM 8.6 8.6*   Liver Function Tests: Recent Labs  Lab 06/24/20 1402  AST 147*  ALT 130*  ALKPHOS 146*  BILITOT 2.1*  PROT 7.4  ALBUMIN 2.9*   Coagulation Profile: Recent Labs  Lab 06/24/20 1402  INR 2.7*    Radiological Exams on Admission: DG Chest Port 1 View  Result Date: 06/24/2020 CLINICAL DATA:  Shortness of breath. EXAM: PORTABLE CHEST 1 VIEW COMPARISON:  Dec 12, 2019. FINDINGS: Stable cardiomediastinal silhouette. No pneumothorax or pleural effusion is noted. Bilateral patchy airspace opacities are now noted concerning for multifocal pneumonia. Bony thorax is unremarkable IMPRESSION:  Bilateral patchy airspace opacities are noted concerning for multifocal pneumonia. Electronically Signed   By: Marijo Conception M.D.   On: 06/24/2020 11:37    EKG: Independently reviewed.  Sinus rhythm, QTC prolonged 561.   T wave changes V2 through V6.  Assessment/Plan Principal Problem:   Acute respiratory failure with hypoxia (HCC) Active Problems:   Multifocal pneumonia   HTN (hypertension)   Type 2 diabetes with nephropathy (HCC)   Hyperlipidemia LDL goal <100   Depression, major, single episode, mild (HCC)   Acute on chronic diastolic (congestive) heart failure (HCC)   Acute-on-chronic kidney injury (HCC)   PAF (paroxysmal atrial fibrillation) ([place])   Acute respiratory failure with hypoxia-likely due to multifocal pneumonia.  O2 sats 82% on room air, requiring BiPAP.  ABG reassuring with pH of 7.4.  -Continue BiPAP, supplemental oxygen  Multifocal pneumonia with severe sepsis-tachypneic and significant leukocytosis of 26.2, lactic acidosis of 3.4 > 1.6, endorgan dysfunction with acute kidney injury on CKD.  Portable chest x-ray suggesting multifocal pneumonia.  Covid and influenza test negative.  Vaccinated for Covid. -Continue antibiotics with IV ceftriaxone and doxycycline -BMP, CBC in the morning -3 L bolus given, hold off on further IV fluids. -Follow-up blood cultures -N.p.o. for now while on BIPAP -Urine strep and Legionella  Acute on chronic kidney injury-creatinine 2.5, baseline ~ 2.  Possibly combination of sepsis and cardiorenal etiology from volume overload. -3 L bolus given, monitor creatinine closely  Decompensated diastolic CHF-bilateral pitting lower extremity edema, reported compliance with Demadex.  BNP elevated at 883, but may also be elevated due to worsening renal insufficiency.  Last echo 04/2020 EF 65 to 70% with grade 1 diastolic dysfunction. -Septic, and required bolus IV fluids, hold off of further fluids. -Strict input output, daily weights -Trend  troponin -Hold torsemide 20 mg daily for now  Prolonged QTC-561 patient is on amiodarone.  Potassium 4.1. -Check magnesium  Elevated liver enzymes-likely from severe sepsis. -Repeat in a.m. -Continue statins for now  Paroxysmal atrial fibrillation, EKG changes- EKG shows  sinus rhythm with T wave changes in V3 to V6 likely from severe sepsis. -Trend troponin -Resume amiodarone, Cardizem, metoprolol -Resume Eliquis -Resume statins - EKG in a.m  Diabetes mellitus-random glucose 405. 200s.  Recent hospitalization for HHS.  Anion gap today 15, serum bicarb 20-from lactic acidosis versus DKA. -Repeat BMP as lactic acidosis has cleared. - SSI- M q4h -Jardiance and glipizide held for now. DVT prophylaxis:  eliquis Code Status: Full code Family Communication: none at bedside Disposition Plan:  > 2 days Consults called: None Admission status: Inpt,  Stepdown I certify that at the point of admission it is my clinical judgment that the patient will require inpatient hospital care spanning beyond 2 midnights from the point of admission due to high intensity of service, high risk for further deterioration and high frequency of surveillance required. The following factors support the patient status of inpatient:    Bethena Roys MD [facility]  06/24/2020, 12:19 AM

## 2020-06-24 NOTE — Progress Notes (Signed)
RN called earlier to let this RT know that she had to put patient on [place] HFNC.  Patient is now on 12L with a sat of 94%.  Bipap (V60) is at bedside if needed.  Will continue to monitor.

## 2020-06-24 NOTE — Progress Notes (Signed)
Notified bedside nurse of need to draw repeat lactic acid. 

## 2020-06-24 NOTE — ED Notes (Signed)
bipap alarming checked on pt and 02 is 100%. Informed resp. about alarming.

## 2020-06-24 NOTE — Progress Notes (Signed)
Notified provider of need to order repeat lactic acid @ 1800.

## 2020-06-24 NOTE — Progress Notes (Signed)
Patient was unable to tolerate BiPAP with increase in RR and HR and declined to continue use. He was placed on 6L [place] with SpO2 94%.

## 2020-06-24 NOTE — ED Triage Notes (Signed)
Pt c/o of sob, HTN, bleeding from penis, decreased appetite x 2 weeks

## 2020-06-24 NOTE — Progress Notes (Signed)
This nurse received a phone call pertaining to this patient stating that patient had a fall a little over a week ago and went to the ER where they found his BS was over 900.  Ever since then he has been complaining of pain all over and he is now bleeding from his penis and has a rash on his buttocks.  This nurse advised to take patient to ER for evaluation and treatment.

## 2020-06-24 NOTE — ED Provider Notes (Signed)
[facility] EMERGENCY DEPARTMENT Provider Note   CSN: [id number] Arrival date & time: 06/24/20  4034     History Chief Complaint  Patient presents with  . Shortness of Breath    Jack Mejia is a 74 y.o. male.  The history is provided by the patient. The history is limited by the condition of the patient.  Shortness of Breath Severity:  Severe Onset quality:  Gradual Duration:  7 days Timing:  Constant Progression:  Worsening Chronicity:  Recurrent Relieved by:  Sitting up and diuretics Worsened by:  Exertion, movement and activity Ineffective treatments:  Diuretics Associated symptoms: PND   Associated symptoms: no abdominal pain, no chest pain, no claudication, no cough, no diaphoresis, no ear pain, no fever, no headaches, no hemoptysis, no neck pain, no rash, no sore throat, no sputum production, no syncope, no swollen glands, no vomiting and no wheezing   Risk factors comment:  Chf, anemia, anticoagulation  74 year old male with a past medical history of A. fib chronically anticoagulated on Eliquis, chronic dyspnea, CHF, diabetes, chronic anemia who presents emergency department with respiratory distress.  He has had months of shortness of breath however over the past week it has gotten significantly worse.  He has been sleeping upright in his recliner.  He states that he has frequent urination.  He arrives with hypoxia and states that he is not on oxygen at home.  Patient has significant difficulty giving the history due to respiratory distress and his relatives supplements.  She states that he has been gasping for air, freezing.  She has noticed worsening swelling in his lower extremities although this is not new.  He has been using his Demadex at home.  He has a history of some renal insufficiency.  He told her this morning that he has noticed blood in his depends but denies hematuria.  He has not had any fevers.  He has been vaccinated against the coronavirus and recently  got his Covid vaccine booster on 10/12 oh I am so sorry I probably did all that I am so sorry I totally forgot to bring about 12 thank goodness /2021.     Past Medical History:  Diagnosis Date  . Arthritis   . Asthma   . Atrial fibrillation ([place])    a. s/p DCCV in 03/2019  . CAD (coronary artery disease) 06/29/2019  . Cramps of left lower extremity   . Depression   . Diabetes mellitus    type 2 for 7-8 yrs  . Dyspnea   . Elevated liver enzymes   . Fatty liver   . History of kidney stones   . Hyperlipidemia   . Hypertension   . Renal insufficiency   . Vertigo     Patient Active Problem List   Diagnosis Date Noted  . Contusion of left hip   . General weakness   . Hyperosmolar hyperglycemic state (HHS) ([place]) 06/06/2020  . PAF (paroxysmal atrial fibrillation) ([place]) 06/06/2020  . CKD (chronic kidney disease), stage III ([place]) 06/06/2020  . Acute gout of right knee 05/05/2020  . Anemia, iron deficiency 01/01/2020  . Macrocytic anemia 12/14/2019  . Coronary artery disease involving native coronary artery of native heart without angina pectoris   . Acute-on-chronic kidney injury ([place]) 04/21/2019  . Acute on chronic diastolic (congestive) heart failure ([place]) 04/15/2019  . Atrial fibrillation with rapid ventricular response ([place]) 02/21/2019  . Depression, major, single episode, mild ([place]) 03/01/2017  . Lumbar stenosis with neurogenic claudication 08/06/2016  .  Erectile dysfunction 12/05/2012  . Sensory neuropathy 12/05/2012  . Hyperlipidemia LDL goal <100 12/05/2012  . Rectal bleeding 10/13/2012  . HTN (hypertension) 07/07/2011  . Type 2 diabetes with nephropathy ([place]) 07/07/2011  . Fatty liver 07/07/2011    Past Surgical History:  Procedure Laterality Date  . CARDIOVERSION N/A 04/21/2019   Procedure: CARDIOVERSION;  Surgeon: Sanda Klein, MD;  Location: [facility] ENDOSCOPY;  Service: Cardiovascular;  Laterality: N/A;  . CARPAL TUNNEL RELEASE     both wrist  . cataract surgery      bilateral  . CHOLECYSTECTOMY    . EYE SURGERY    . HEMORROIDECTOMY    . KNEE SURGERY Left   . LEFT HEART CATH AND CORONARY ANGIOGRAPHY N/A 05/23/2019   Procedure: LEFT HEART CATH AND CORONARY ANGIOGRAPHY;  Surgeon: Belva Crome, MD;  Location: [place] CV LAB;  Service: Cardiovascular;  Laterality: N/A;  . LUMBAR LAMINECTOMY/DECOMPRESSION MICRODISCECTOMY N/A 08/06/2016   Procedure: LUMBAR THREE- LUMBAR FIVE  DECOMPRESSIVE LUMBAR LAMINECTOMY;  Surgeon: Jovita Gamma, MD;  Location: [place];  Service: Neurosurgery;  Laterality: N/A;       Family History  Problem Relation Age of Onset  . Diabetes Mother   . Cancer Mother        unknown kind  . Diabetes Sister   . COPD Sister   . Liver disease Brother   . COPD Brother   . Diabetes Maternal Grandmother   . Diabetes Brother   . COPD Sister   . Healthy Daughter   . Healthy Daughter     Social History   Tobacco Use  . Smoking status: Former Smoker    Packs/day: 4.00    Years: 20.00    Pack years: 80.00    Types: Cigarettes    Quit date: 10/14/1982    Years since quitting: 37.7  . Smokeless tobacco: Never Used  . Tobacco comment: 29 yrs ago  Vaping Use  . Vaping Use: Never used  Substance Use Topics  . Alcohol use: No  . Drug use: No    Home Medications Prior to Admission medications   Medication Sig Start Date End Date Taking? Authorizing Provider  allopurinol (ZYLOPRIM) 100 MG tablet Take 1 tablet (100 mg total) by mouth daily. For gout 06/18/20   Elvia Collum M, DO  amiodarone (PACERONE) 200 MG tablet TAKE 1 TABLET BY MOUTH EVERY DAY 12/05/19   Herminio Commons, MD  apixaban (ELIQUIS) 5 MG TABS tablet Take 1 tablet (5 mg total) by mouth 2 (two) times daily. 10/03/19   Herminio Commons, MD  atorvastatin (LIPITOR) 20 MG tablet Take 1 tablet (20 mg total) by mouth daily. 10/03/19 09/27/20  Herminio Commons, MD  diltiazem (CARDIZEM CD) 360 MG 24 hr capsule Take 1 capsule (360 mg total) by mouth daily.  04/22/20   Strader, Fransisco Hertz, PA-C  empagliflozin (JARDIANCE) 10 MG TABS tablet Take 1 tablet (10 mg total) by mouth daily before breakfast. 04/23/20   Lovena Le, Malena M, DO  glipiZIDE (GLUCOTROL) 5 MG tablet Take 2 tablets (10 mg total) by mouth 2 (two) times daily. 04/22/20   Elvia Collum M, DO  metoprolol tartrate (LOPRESSOR) 25 MG tablet Take 1 tablet (25 mg total) by mouth 2 (two) times daily. 04/30/20   Lovena Le, Malena M, DO  torsemide (DEMADEX) 20 MG tablet Take 1 tablet (20 mg total) by mouth daily. 05/31/20 05/26/21  Verta Ellen., NP  traMADol Veatrice Bourbon) 50 MG tablet Take 1 tablet (50 mg total) by  mouth every 12 (twelve) hours as needed for severe pain. 06/18/20 06/18/21  Erven Colla, DO  Vitamin D, Ergocalciferol, (DRISDOL) 1.25 MG (50000 UNIT) CAPS capsule Take 1 capsule (50,000 Units total) by mouth every 7 (seven) days. 06/07/20   Barton Dubois, MD    Allergies    Demerol, Vasotec [enalapril], and Lasix [furosemide]  Review of Systems   Review of Systems  Constitutional: Negative for diaphoresis and fever.  HENT: Negative for ear pain and sore throat.   Respiratory: Positive for shortness of breath. Negative for cough, hemoptysis, sputum production and wheezing.   Cardiovascular: Positive for leg swelling and PND. Negative for chest pain, claudication and syncope.  Gastrointestinal: Negative for abdominal pain and vomiting.  Endocrine: Positive for cold intolerance.  Musculoskeletal: Negative for neck pain.  Skin: Negative for rash. Pallor: ow.  Neurological: Negative for headaches.    Physical Exam Updated Vital Signs BP 131/63 (BP Location: Right Arm)   Pulse 73   Temp 97.8 F (36.6 C) (Oral)   Resp 18   Ht 5\' 10"  (1.778 m)   Wt 90.7 kg   SpO2 (!) 82%   BMI 28.70 kg/m   Physical Exam Vitals and nursing note reviewed.  Constitutional:      General: He is in acute distress.     Appearance: He is obese.  HENT:     Head: Normocephalic and atraumatic.   Eyes:     Extraocular Movements: Extraocular movements intact.     Pupils: Pupils are equal, round, and reactive to light.  Cardiovascular:     Rate and Rhythm: Normal rate.  Pulmonary:     Effort: Tachypnea and respiratory distress present.     Breath sounds: Examination of the right-middle field reveals rales. Examination of the left-middle field reveals rales. Examination of the right-lower field reveals rales. Examination of the left-lower field reveals rales. Rales present.  Abdominal:     General: Abdomen is protuberant.     Tenderness: There is no abdominal tenderness.  Genitourinary:    Penis: Uncircumcised.      Comments: Thick white discharge under the foreskin with erythematous tissue and mild ulceration consistent with yeast balanitis Musculoskeletal:     Right lower leg: Edema present.     Left lower leg: Edema present.  Skin:    Coloration: Skin is pale.  Neurological:     Mental Status: He is alert.     ED Results / Procedures / Treatments   Labs (all labs ordered are listed, but only abnormal results are displayed) Labs Reviewed  RESP PANEL BY RT-PCR (FLU A&B, COVID) [id number]    EKG None  Radiology No results found.  Procedures .Critical Care Performed by: Margarita Mail, PA-C Authorized by: Margarita Mail, PA-C   Critical care provider statement:    Critical care time (minutes):  70   Critical care time was exclusive of:  Separately billable procedures and treating other patients   Critical care was necessary to treat or prevent imminent or life-threatening deterioration of the following conditions:  Sepsis and respiratory failure   Critical care was time spent personally by me on the following activities:  Discussions with consultants, evaluation of patient's response to treatment, examination of patient, ordering and performing treatments and interventions, ordering and review of laboratory studies, ordering and review of radiographic studies, pulse  oximetry, re-evaluation of patient's condition, obtaining history from patient or surrogate and review of old charts   (including critical care time)  Medications Ordered in ED Medications -  No data to display  ED Course  I have reviewed the triage vital signs and the nursing notes.  Pertinent labs & imaging results that were available during my care of the patient were reviewed by me and considered in my medical decision making (see chart for details).  Clinical Course as of Jun 24 1836  Mon Nov 29, 192  5820 74 year old male here with increased shortness of breath hypoxia.  Not normally require oxygen.  Increased peripheral edema.  Here is tachypneic and has pitting edema lower extremities.  Getting labs chest x-ray EKG.  Likely will require BiPAP.  Plan admission.   [MB]  1120 Chart states that patient gets a rash from Lasix. Discussed with patient and relative at bedside and all agree benefits outweigh risks.   [AH]  1130 Lasix d/c'd CXR consistent with pneumonia    [AH]    Clinical Course User Index [AH] Margarita Mail, PA-C [MB] Hayden Rasmussen, MD   MDM Rules/Calculators/A&P                          CC:sob VS:  Vitals:   06/24/20 1700 06/24/20 1730 06/24/20 1800 06/24/20 1830  BP: (!) 105/55 129/89 139/70 137/64  Pulse: 72 70 72 70  Resp: (!) 29 (!) 21 (!) 25 20  Temp:      TempSrc:      SpO2: 100% 100% 100% 100%  Weight:      Height:        FY:BOFBPZW is gathered by patient and emr, wife at bed. Previous records obtained and reviewed. DDX:The patient's complaint of sob involves an extensive number of diagnostic and treatment options, and is a complaint that carries with it a high risk of complications, morbidity, and potential mortality. Given the large differential diagnosis, medical decision making is of high complexity. The emergent differential diagnosis for shortness of breath includes, but is not limited to, Pulmonary edema, bronchoconstriction,  Pneumonia, Pulmonary embolism, Pneumotherax/ Hemothorax, Dysrythmia, ACS.  Labs: I ordered reviewed and interpreted labs which include CBC which is elevated white blood cell count of 26,000, hemoglobin at 7.5 is slightly below the patient's baseline of 8.  Patient's BNP elevated to 883 and above his baseline of 300.  CMP with elevated blood glucose of 405, anion gap acidosis, elevated AST ALT and alkaline phosphatase with albumin of 2.9.  Slightly elevated in the setting of chronic renal insufficiency.  Patient's Covid and flu swabs are negative.  PT/INR elevated Imaging: I ordered and reviewed images which included 1 view chest x-ray. I independently visualized and interpreted all imaging. Significant findings include findings consistent with multifocal pneumonia, no evidence of pulmonary edema.  EKG: Consults: Dr. Denton Brick MDM: Patient here with multifocal pneumonia, shortness of breath.  Patient requiring BiPAP for his respiratory failure. Patient disposition:The patient appears reasonably stabilized for admission considering the current resources, flow, and capabilities available in the ED at this time, and I doubt any other [facility] requiring further screening and/or treatment in the ED prior to admission.        Final Clinical Impression(s) / ED Diagnoses Final diagnoses:  None    Rx / DC Orders ED Discharge Orders    None       Margarita Mail, PA-C 06/24/20 1912    Hayden Rasmussen, MD 06/25/20 [phone]

## 2020-06-24 NOTE — Telephone Encounter (Signed)
Yes, thank you in agreement with going to ER for evaluation. Dr. Lovena Le

## 2020-06-24 NOTE — ED Notes (Signed)
Patient was placed on a male pure wick. RN was by bedside.

## 2020-06-24 NOTE — Telephone Encounter (Signed)
Pt's girlfriend Lelon Frohlich called and states he is gasping for air and last night his oxygen level was 68. Bleeding from penis, weakness, blood sugar 400. Thinks symptoms are side effects from jardiance. Advised to call 911 since he is gasping for air and having low oxygen. States she will call for ambulance.

## 2020-06-24 NOTE — Progress Notes (Signed)
Pharmacy Antibiotic Note  Jack Mejia is a 74 y.o. male admitted on 06/24/2020 with pneumonia.  Pharmacy has been consulted for Vancomycin and Cefepime dosing.  Plan: Vancomycin 2000 mg IV x 1 dose. Vancomycin 1750 mg IV every 48 hours. Cefepime 2000 mg IV every 12 hours. Monitor labs, c/s, and vanco level as indicated.  Height: 5\' 10"  (177.8 cm) Weight: 90.7 kg (200 lb) IBW/kg (Calculated) : 73  Temp (24hrs), Avg:97.8 F (36.6 C), Min:97.8 F (36.6 C), Max:97.8 F (36.6 C)  Recent Labs  Lab 06/18/20 1122 06/24/20 1402  WBC  --  26.2*  CREATININE 2.04* 2.51*  LATICACIDVEN  --  3.4*    Estimated Creatinine Clearance: 29.3 mL/min (A) (by C-G formula based on SCr of 2.51 mg/dL (H)).    Allergies  Allergen Reactions  . Demerol Nausea And Vomiting  . Vasotec [Enalapril] Cough  . Lasix [Furosemide] Rash    Antimicrobials this admission: Vanco 11/29 >>  Cefepime 11/29 >>    Microbiology results: 11/29 BCx: pending 11/29 MRSA PCR: pending  Thank you for allowing pharmacy to be a part of this patient's care.  Ramond Craver 06/24/2020 3:27 PM

## 2020-06-25 ENCOUNTER — Encounter (HOSPITAL_COMMUNITY): Payer: Self-pay | Admitting: Internal Medicine

## 2020-06-25 ENCOUNTER — Inpatient Hospital Stay (HOSPITAL_COMMUNITY): Payer: Medicare HMO

## 2020-06-25 DIAGNOSIS — I10 Essential (primary) hypertension: Secondary | ICD-10-CM

## 2020-06-25 DIAGNOSIS — N1832 Chronic kidney disease, stage 3b: Secondary | ICD-10-CM

## 2020-06-25 DIAGNOSIS — R7989 Other specified abnormal findings of blood chemistry: Secondary | ICD-10-CM | POA: Insufficient documentation

## 2020-06-25 DIAGNOSIS — E785 Hyperlipidemia, unspecified: Secondary | ICD-10-CM

## 2020-06-25 DIAGNOSIS — I5033 Acute on chronic diastolic (congestive) heart failure: Secondary | ICD-10-CM

## 2020-06-25 DIAGNOSIS — I480 Paroxysmal atrial fibrillation: Secondary | ICD-10-CM

## 2020-06-25 DIAGNOSIS — I5030 Unspecified diastolic (congestive) heart failure: Secondary | ICD-10-CM

## 2020-06-25 DIAGNOSIS — K921 Melena: Secondary | ICD-10-CM

## 2020-06-25 DIAGNOSIS — D62 Acute posthemorrhagic anemia: Secondary | ICD-10-CM

## 2020-06-25 DIAGNOSIS — N179 Acute kidney failure, unspecified: Secondary | ICD-10-CM

## 2020-06-25 DIAGNOSIS — J189 Pneumonia, unspecified organism: Secondary | ICD-10-CM

## 2020-06-25 LAB — COMPREHENSIVE METABOLIC PANEL
ALT: 96 U/L — ABNORMAL HIGH (ref 0–44)
AST: 61 U/L — ABNORMAL HIGH (ref 15–41)
Albumin: 2.6 g/dL — ABNORMAL LOW (ref 3.5–5.0)
Alkaline Phosphatase: 120 U/L (ref 38–126)
Anion gap: 13 (ref 5–15)
BUN: 45 mg/dL — ABNORMAL HIGH (ref 8–23)
CO2: 19 mmol/L — ABNORMAL LOW (ref 22–32)
Calcium: 8 mg/dL — ABNORMAL LOW (ref 8.9–10.3)
Chloride: 106 mmol/L (ref 98–111)
Creatinine, Ser: 2.2 mg/dL — ABNORMAL HIGH (ref 0.61–1.24)
GFR, Estimated: 31 mL/min — ABNORMAL LOW (ref >60)
Glucose, Bld: 297 mg/dL — ABNORMAL HIGH (ref 70–99)
Potassium: 3.7 mmol/L (ref 3.5–5.1)
Sodium: 138 mmol/L (ref 135–145)
Total Bilirubin: 1.8 mg/dL — ABNORMAL HIGH (ref 0.3–1.2)
Total Protein: 6.6 g/dL (ref 6.5–8.1)

## 2020-06-25 LAB — CBG MONITORING, ED
Glucose-Capillary: 209 mg/dL — ABNORMAL HIGH (ref 70–99)
Glucose-Capillary: 212 mg/dL — ABNORMAL HIGH (ref 70–99)
Glucose-Capillary: 228 mg/dL — ABNORMAL HIGH (ref 70–99)
Glucose-Capillary: 275 mg/dL — ABNORMAL HIGH (ref 70–99)

## 2020-06-25 LAB — BASIC METABOLIC PANEL
Anion gap: 14 (ref 5–15)
BUN: 46 mg/dL — ABNORMAL HIGH (ref 8–23)
CO2: 19 mmol/L — ABNORMAL LOW (ref 22–32)
Calcium: 8.1 mg/dL — ABNORMAL LOW (ref 8.9–10.3)
Chloride: 104 mmol/L (ref 98–111)
Creatinine, Ser: 2.44 mg/dL — ABNORMAL HIGH (ref 0.61–1.24)
GFR, Estimated: 27 mL/min — ABNORMAL LOW (ref >60)
Glucose, Bld: 317 mg/dL — ABNORMAL HIGH (ref 70–99)
Potassium: 4.1 mmol/L (ref 3.5–5.1)
Sodium: 137 mmol/L (ref 135–145)

## 2020-06-25 LAB — VITAMIN B12: Vitamin B-12: 835 pg/mL (ref 180–914)

## 2020-06-25 LAB — CBC
HCT: 22.8 % — ABNORMAL LOW (ref 39.0–52.0)
Hemoglobin: 7 g/dL — ABNORMAL LOW (ref 13.0–17.0)
MCH: 37.2 pg — ABNORMAL HIGH (ref 26.0–34.0)
MCHC: 30.7 g/dL (ref 30.0–36.0)
MCV: 121.3 fL — ABNORMAL HIGH (ref 80.0–100.0)
Platelets: 208 10*3/uL (ref 150–400)
RBC: 1.88 MIL/uL — ABNORMAL LOW (ref 4.22–5.81)
RDW: 16.3 % — ABNORMAL HIGH (ref 11.5–15.5)
WBC: 26.3 10*3/uL — ABNORMAL HIGH (ref 4.0–10.5)
nRBC: 0.1 % (ref 0.0–0.2)

## 2020-06-25 LAB — HEMOGLOBIN A1C
Hgb A1c MFr Bld: 8.3 % — ABNORMAL HIGH (ref 4.8–5.6)
Mean Plasma Glucose: 191.51 mg/dL

## 2020-06-25 LAB — GLUCOSE, CAPILLARY
Glucose-Capillary: 201 mg/dL — ABNORMAL HIGH (ref 70–99)
Glucose-Capillary: 224 mg/dL — ABNORMAL HIGH (ref 70–99)
Glucose-Capillary: 242 mg/dL — ABNORMAL HIGH (ref 70–99)
Glucose-Capillary: 274 mg/dL — ABNORMAL HIGH (ref 70–99)

## 2020-06-25 LAB — ABO/RH: ABO/RH(D): O POS

## 2020-06-25 LAB — FOLATE: Folate: 29.1 ng/mL (ref >5.9)

## 2020-06-25 LAB — POC OCCULT BLOOD, ED: Fecal Occult Bld: POSITIVE — AB

## 2020-06-25 LAB — TROPONIN I (HIGH SENSITIVITY)
Troponin I (High Sensitivity): 42 ng/L — ABNORMAL HIGH (ref <18)
Troponin I (High Sensitivity): 41 ng/L — ABNORMAL HIGH (ref <18)

## 2020-06-25 LAB — MRSA PCR SCREENING: MRSA by PCR: NEGATIVE

## 2020-06-25 LAB — FERRITIN: Ferritin: 1250 ng/mL — ABNORMAL HIGH (ref 24–336)

## 2020-06-25 LAB — PREPARE RBC (CROSSMATCH)

## 2020-06-25 LAB — IRON AND TIBC
Iron: 52 ug/dL (ref 45–182)
Saturation Ratios: 24 % (ref 17.9–39.5)
TIBC: 213 ug/dL — ABNORMAL LOW (ref 250–450)
UIBC: 161 ug/dL

## 2020-06-25 LAB — MAGNESIUM: Magnesium: 2.6 mg/dL — ABNORMAL HIGH (ref 1.7–2.4)

## 2020-06-25 LAB — PROCALCITONIN: Procalcitonin: 0.48 ng/mL

## 2020-06-25 MED ORDER — AMIODARONE HCL 200 MG PO TABS
200.0000 mg | ORAL_TABLET | Freq: Every day | ORAL | Status: DC
  Administered 2020-06-25 – 2020-06-27 (×3): 200 mg via ORAL
  Filled 2020-06-25 (×3): qty 1

## 2020-06-25 MED ORDER — SODIUM CHLORIDE 0.9% IV SOLUTION: Freq: Once | INTRAVENOUS | Status: DC

## 2020-06-25 MED ORDER — SODIUM CHLORIDE 0.9 % IV SOLN
100.0000 mg | Freq: Two times a day (BID) | INTRAVENOUS | Status: DC
  Administered 2020-06-25 – 2020-06-27 (×6): 100 mg via INTRAVENOUS
  Filled 2020-06-25 (×9): qty 100

## 2020-06-25 MED ORDER — CHLORHEXIDINE GLUCONATE CLOTH 2 % EX PADS
6.0000 | MEDICATED_PAD | Freq: Every day | CUTANEOUS | Status: DC
  Administered 2020-06-25 – 2020-07-10 (×16): 6 via TOPICAL

## 2020-06-25 MED ORDER — ORAL CARE MOUTH RINSE
15.0000 mL | Freq: Two times a day (BID) | OROMUCOSAL | Status: DC
  Administered 2020-06-25 – 2020-07-10 (×29): 15 mL via OROMUCOSAL

## 2020-06-25 MED ORDER — ACETAMINOPHEN 325 MG PO TABS
650.0000 mg | ORAL_TABLET | Freq: Four times a day (QID) | ORAL | Status: DC | PRN
  Administered 2020-06-29 – 2020-07-06 (×2): 650 mg via ORAL
  Filled 2020-06-25 (×3): qty 2

## 2020-06-25 MED ORDER — DILTIAZEM HCL ER COATED BEADS 180 MG PO CP24
360.0000 mg | ORAL_CAPSULE | Freq: Every day | ORAL | Status: DC
  Administered 2020-06-25 – 2020-07-10 (×16): 360 mg via ORAL
  Filled 2020-06-25 (×2): qty 2
  Filled 2020-06-25: qty 1
  Filled 2020-06-25 (×2): qty 2
  Filled 2020-06-25: qty 1
  Filled 2020-06-25 (×10): qty 2

## 2020-06-25 MED ORDER — METOPROLOL TARTRATE 25 MG PO TABS
25.0000 mg | ORAL_TABLET | Freq: Two times a day (BID) | ORAL | Status: DC
  Administered 2020-06-25 – 2020-07-09 (×31): 25 mg via ORAL
  Filled 2020-06-25 (×32): qty 1

## 2020-06-25 MED ORDER — APIXABAN 5 MG PO TABS
5.0000 mg | ORAL_TABLET | Freq: Two times a day (BID) | ORAL | Status: DC
  Administered 2020-06-25: 5 mg via ORAL
  Filled 2020-06-25: qty 1

## 2020-06-25 MED ORDER — POLYETHYLENE GLYCOL 3350 17 G PO PACK: 17.0000 g | PACK | Freq: Every day | ORAL | Status: DC | PRN

## 2020-06-25 MED ORDER — BUMETANIDE 0.25 MG/ML IJ SOLN
2.0000 mg | Freq: Two times a day (BID) | INTRAMUSCULAR | Status: DC
  Administered 2020-06-25 – 2020-06-26 (×3): 2 mg via INTRAVENOUS
  Filled 2020-06-25 (×7): qty 8

## 2020-06-25 MED ORDER — ACETAMINOPHEN 650 MG RE SUPP: 650.0000 mg | Freq: Four times a day (QID) | RECTAL | Status: DC | PRN

## 2020-06-25 MED ORDER — ALBUTEROL SULFATE (2.5 MG/3ML) 0.083% IN NEBU
2.5000 mg | INHALATION_SOLUTION | RESPIRATORY_TRACT | Status: DC | PRN
  Administered 2020-06-25: 2.5 mg via RESPIRATORY_TRACT
  Filled 2020-06-25: qty 3

## 2020-06-25 MED ORDER — INSULIN DETEMIR 100 UNIT/ML ~~LOC~~ SOLN
10.0000 [IU] | Freq: Every day | SUBCUTANEOUS | Status: DC
  Administered 2020-06-25 – 2020-07-03 (×9): 10 [IU] via SUBCUTANEOUS
  Filled 2020-06-25 (×11): qty 0.1

## 2020-06-25 MED ORDER — HYDROCORTISONE (PERIANAL) 2.5 % EX CREA
TOPICAL_CREAM | Freq: Two times a day (BID) | CUTANEOUS | Status: DC
  Administered 2020-06-29 – 2020-07-09 (×2): 1 via RECTAL
  Filled 2020-06-25: qty 28.35

## 2020-06-25 MED ORDER — ATORVASTATIN CALCIUM 20 MG PO TABS
20.0000 mg | ORAL_TABLET | Freq: Every day | ORAL | Status: DC
  Administered 2020-06-25 – 2020-07-10 (×16): 20 mg via ORAL
  Filled 2020-06-25 (×13): qty 1
  Filled 2020-06-25: qty 2
  Filled 2020-06-25 (×2): qty 1

## 2020-06-25 MED ORDER — INSULIN ASPART 100 UNIT/ML ~~LOC~~ SOLN
0.0000 [IU] | SUBCUTANEOUS | Status: DC
  Administered 2020-06-25 (×4): 5 [IU] via SUBCUTANEOUS
  Administered 2020-06-25: 8 [IU] via SUBCUTANEOUS
  Administered 2020-06-25: 5 [IU] via SUBCUTANEOUS
  Administered 2020-06-25: 8 [IU] via SUBCUTANEOUS
  Administered 2020-06-26: 5 [IU] via SUBCUTANEOUS
  Administered 2020-06-26 (×3): 3 [IU] via SUBCUTANEOUS
  Administered 2020-06-26 (×2): 5 [IU] via SUBCUTANEOUS
  Administered 2020-06-27 (×2): 3 [IU] via SUBCUTANEOUS
  Administered 2020-06-27: 8 [IU] via SUBCUTANEOUS
  Administered 2020-06-27: 5 [IU] via SUBCUTANEOUS
  Administered 2020-06-27 – 2020-06-28 (×2): 8 [IU] via SUBCUTANEOUS
  Administered 2020-06-28: 2 [IU] via SUBCUTANEOUS
  Administered 2020-06-28: 3 [IU] via SUBCUTANEOUS
  Administered 2020-06-28: 2 [IU] via SUBCUTANEOUS
  Administered 2020-06-29: 8 [IU] via SUBCUTANEOUS
  Administered 2020-06-29: 15 [IU] via SUBCUTANEOUS
  Administered 2020-06-29: 3 [IU] via SUBCUTANEOUS
  Administered 2020-06-29 – 2020-06-30 (×2): 8 [IU] via SUBCUTANEOUS
  Administered 2020-06-30 (×3): 15 [IU] via SUBCUTANEOUS
  Administered 2020-06-30: 11 [IU] via SUBCUTANEOUS
  Administered 2020-06-30 – 2020-07-01 (×2): 15 [IU] via SUBCUTANEOUS
  Administered 2020-07-01: 5 [IU] via SUBCUTANEOUS
  Administered 2020-07-01 (×2): 15 [IU] via SUBCUTANEOUS
  Administered 2020-07-01: 8 [IU] via SUBCUTANEOUS
  Filled 2020-06-25 (×3): qty 1

## 2020-06-25 MED ORDER — SODIUM CHLORIDE 0.9 % IV SOLN
2.0000 g | INTRAVENOUS | Status: AC
  Administered 2020-06-25 – 2020-06-29 (×5): 2 g via INTRAVENOUS
  Filled 2020-06-25 (×6): qty 20

## 2020-06-25 NOTE — Consult Note (Signed)
$'@LOGO'x$ @   Referring Provider: Dr. Carles Collet Primary Care Physician:  Erven Colla, DO Primary Gastroenterologist:  Dr. Laural Golden  Date of Admission:  Date of Consultation:   Reason for Consultation: Hematochezia, acute blood loss anemia  HPI:  Jack Mejia is a 74 y.o. year old male with a history of paroxysmal atrial fibrillation on apixaban, CKD stage III, hypertension, diastolic CHF, hyperlipidemia, diabetes mellitus type 2, macrocytic anemia following with hematology s/p iron injection and receiving Retacrit, and adenomatous colon polyps in 2011 who was admitted 11/29 with sepsis and acute respiratory failure in the setting of pneumonia and pulmonary edema with acute on chronic diastolic CHF.  Patient also endorsed intermittent hematochezia for the last month.   Upon arrival in the emergency department, the patient was noted to have oxygen saturation of 82% on room air.  He was placed on 7 L with some improvement, but continued to have increased work of breathing.  He is subsequently placed on BiPAP.  Chest x-ray showed bilateral patchy infiltrates. Respiratory panel negative. BNP 883. Patient was started on antibiotics as well as IV Bumex.  CMP showed serum creatinine 2.51,  AST 147, ALT 130, alk phosphatase 146, total bilirubin 2.1.  WBC 26.2, hemoglobin 7.5, platelets 239,000.   This morning hemoglobin down to 7 and 1 unit PRBCs ordered. Iron panel with iron 52, saturation 24%, ferritin 1,250, B12 835, folate 29.1.   Today:  Patient is a difficult historian.  Notes a few year history of intermittent low-volume rectal bleeding occurring 2 to 3 days then stopping for 2-3 days.  Blood is bright red and is on toilet tissue, and water, and occasionally on the stool.  No rectal pain typically. Occasionally feels raw. Slight discomfort at this time. Last episode was yesterday. Denies melena. Denies abdominal pain, constipation, or diarrhea.  Typically with bowel movements daily.  Over the last  several weeks, he has had lack of appetite and has not been eating very much, so BMs are every couple of days but are still soft and formed.  Denies nausea or vomiting.  Denies GERD symptoms, dysphagia, or upper abdominal pain.  States he has been receiving infusions with hematology/oncology.  Per review of notes with Dr. Delton Coombes, patient has been being followed for macrocytosis.  He has had work-up including bone marrow biopsy on 03/29/2020 with hypercellular marrow with trilineage dysplasia.  Blasts are 2%.  Chromosome analysis and FISH panel are normal.  He received 5 doses of Venofer completed on 01/24/2020.  Also receiving Retacrit 30,000 units weekly started 04/08/2020.  Retacrit was later changed to 40,000 units every 2 weeks on 04/29/2020. Last injection was 06/10/20.  Continues to have fatigue and significant shortness of breath and occasional cough.  States if he lays still, he is okay, but with any movement, he gets short of breath.  Denies chest pain or heart palpitations.  No family history of colon cancer.  Denies alcohol use, illicit drug use, or over-the-counter supplements.  Fairly rare Tylenol use for occasional headaches.  Last dose of Eliquis at 12:59am today.   Last colonoscopy 06/05/2010: 5 small polyps ablated via cold biopsy, 6 small polyps snared from sigmoid colon, polypoid structure at dentate line about 12 mm in maximal diameter possibly anal papilla biopsied.  Tubular adenomas x2, hyperplastic polyps x2, and sessile serrated adenoma x1. He was on recall for 5 year repeat but declined.   Past Medical History:  Diagnosis Date  . Arthritis   . Asthma   . Atrial  fibrillation ([place])    a. s/p DCCV in 03/2019  . CAD (coronary artery disease) 06/29/2019  . Cramps of left lower extremity   . Depression   . Diabetes mellitus    type 2 for 7-8 yrs  . Dyspnea   . Elevated liver enzymes   . Fatty liver   . History of kidney stones   . Hyperlipidemia   . Hypertension   .  Renal insufficiency   . Vertigo     Past Surgical History:  Procedure Laterality Date  . CARDIOVERSION N/A 04/21/2019   Procedure: CARDIOVERSION;  Surgeon: Sanda Klein, MD;  Location: [facility];  Service: Cardiovascular;  Laterality: N/A;  . CARPAL TUNNEL RELEASE     both wrist  . cataract surgery     bilateral  . CHOLECYSTECTOMY    . EYE SURGERY    . HEMORROIDECTOMY    . KNEE SURGERY Left   . LEFT HEART CATH AND CORONARY ANGIOGRAPHY N/A 05/23/2019   Procedure: LEFT HEART CATH AND CORONARY ANGIOGRAPHY;  Surgeon: Belva Crome, MD;  Location: [facility];  Service: Cardiovascular;  Laterality: N/A;  . LUMBAR LAMINECTOMY/DECOMPRESSION MICRODISCECTOMY N/A 08/06/2016   Procedure: LUMBAR THREE- LUMBAR FIVE  DECOMPRESSIVE LUMBAR LAMINECTOMY;  Surgeon: Jovita Gamma, MD;  Location: [facility];  Service: Neurosurgery;  Laterality: N/A;    Prior to Admission medications   Medication Sig Start Date End Date Taking? Authorizing Provider  acetaminophen (TYLENOL) 650 MG CR tablet Take 650 mg by mouth every 8 (eight) hours as needed for pain.   Yes [provider]  amiodarone (PACERONE) 200 MG tablet TAKE 1 TABLET BY MOUTH EVERY DAY 12/05/19  Yes Herminio Commons, MD  apixaban (ELIQUIS) 5 MG TABS tablet Take 1 tablet (5 mg total) by mouth 2 (two) times daily. 10/03/19  Yes Herminio Commons, MD  atorvastatin (LIPITOR) 20 MG tablet Take 1 tablet (20 mg total) by mouth daily. 10/03/19 09/27/20 Yes Herminio Commons, MD  diltiazem (CARDIZEM CD) 360 MG 24 hr capsule Take 1 capsule (360 mg total) by mouth daily. 04/22/20  Yes Strader, [place], PA-C  empagliflozin (JARDIANCE) 10 MG TABS tablet Take 1 tablet (10 mg total) by mouth daily before breakfast. 04/23/20  Yes Lovena Le, Malena M, DO  glipiZIDE (GLUCOTROL) 5 MG tablet Take 2 tablets (10 mg total) by mouth 2 (two) times daily. 04/22/20  Yes Lovena Le, Malena M, DO  ibuprofen (ADVIL) 200 MG tablet Take 200 mg by mouth every 6 (six)  hours as needed.   Yes [provider]  metoprolol tartrate (LOPRESSOR) 25 MG tablet Take 1 tablet (25 mg total) by mouth 2 (two) times daily. 04/30/20  Yes Lovena Le, Malena M, DO  torsemide (DEMADEX) 20 MG tablet Take 1 tablet (20 mg total) by mouth daily. 05/31/20 05/26/21 Yes Verta Ellen., NP  traMADol (ULTRAM) 50 MG tablet Take 1 tablet (50 mg total) by mouth every 12 (twelve) hours as needed for severe pain. 06/18/20 06/18/21 Yes Lovena Le, Malena M, DO  Vitamin D, Ergocalciferol, (DRISDOL) 1.25 MG (50000 UNIT) CAPS capsule Take 1 capsule (50,000 Units total) by mouth every 7 (seven) days. 06/07/20  Yes Barton Dubois, MD  allopurinol (ZYLOPRIM) 100 MG tablet Take 1 tablet (100 mg total) by mouth daily. For gout 06/18/20   Elvia Collum M, DO    Current Facility-Administered Medications  Medication Dose Route Frequency Provider Last Rate Last Admin  . 0.9 %  sodium chloride infusion (Manually program via Guardrails IV Fluids)  Intravenous Once Tat, Shanon Brow, MD      . acetaminophen (TYLENOL) tablet 650 mg  650 mg Oral Q6H PRN Emokpae, Ejiroghene E, MD       Or  . acetaminophen (TYLENOL) suppository 650 mg  650 mg Rectal Q6H PRN Emokpae, Ejiroghene E, MD      . albuterol (PROVENTIL) (2.5 MG/3ML) 0.083% nebulizer solution 2.5 mg  2.5 mg Nebulization Q2H PRN Emokpae, Ejiroghene E, MD   2.5 mg at 06/25/20 0321  . amiodarone (PACERONE) tablet 200 mg  200 mg Oral Daily Emokpae, Ejiroghene E, MD      . atorvastatin (LIPITOR) tablet 20 mg  20 mg Oral Daily Emokpae, Ejiroghene E, MD      . bumetanide (BUMEX) injection 2 mg  2 mg Intravenous Q12H Tat, David, MD      . cefTRIAXone (ROCEPHIN) 2 g in sodium chloride 0.9 % 100 mL IVPB  2 g Intravenous Q24H Emokpae, Ejiroghene E, MD   Stopped at 06/25/20 0327  . diltiazem (CARDIZEM CD) 24 hr capsule 360 mg  360 mg Oral Daily Emokpae, Ejiroghene E, MD      . doxycycline (VIBRAMYCIN) 100 mg in sodium chloride 0.9 % 250 mL IVPB  100 mg Intravenous  Q12H Emokpae, Ejiroghene E, MD   Stopped at 06/25/20 0258  . insulin aspart (novoLOG) injection 0-15 Units  0-15 Units Subcutaneous Q4H Emokpae, Ejiroghene E, MD   5 Units at 06/25/20 0327  . insulin detemir (LEVEMIR) injection 10 Units  10 Units Subcutaneous Daily Tat, David, MD      . metoprolol tartrate (LOPRESSOR) tablet 25 mg  25 mg Oral BID Emokpae, Ejiroghene E, MD   25 mg at 06/25/20 0059  . polyethylene glycol (MIRALAX / GLYCOLAX) packet 17 g  17 g Oral Daily PRN Emokpae, Ejiroghene E, MD       Current Outpatient Medications  Medication Sig Dispense Refill  . acetaminophen (TYLENOL) 650 MG CR tablet Take 650 mg by mouth every 8 (eight) hours as needed for pain.    [name] amiodarone (PACERONE) 200 MG tablet TAKE 1 TABLET BY MOUTH EVERY DAY 90 tablet 2  . apixaban (ELIQUIS) 5 MG TABS tablet Take 1 tablet (5 mg total) by mouth 2 (two) times daily. 60 tablet 6  . atorvastatin (LIPITOR) 20 MG tablet Take 1 tablet (20 mg total) by mouth daily. 90 tablet 3  . diltiazem (CARDIZEM CD) 360 MG 24 hr capsule Take 1 capsule (360 mg total) by mouth daily. 90 capsule 1  . empagliflozin (JARDIANCE) 10 MG TABS tablet Take 1 tablet (10 mg total) by mouth daily before breakfast. 30 tablet 2  . glipiZIDE (GLUCOTROL) 5 MG tablet Take 2 tablets (10 mg total) by mouth 2 (two) times daily. 120 tablet 1  . ibuprofen (ADVIL) 200 MG tablet Take 200 mg by mouth every 6 (six) hours as needed.    . metoprolol tartrate (LOPRESSOR) 25 MG tablet Take 1 tablet (25 mg total) by mouth 2 (two) times daily. 180 tablet 1  . torsemide (DEMADEX) 20 MG tablet Take 1 tablet (20 mg total) by mouth daily. 30 tablet 11  . traMADol (ULTRAM) 50 MG tablet Take 1 tablet (50 mg total) by mouth every 12 (twelve) hours as needed for severe pain. 30 tablet 0  . Vitamin D, Ergocalciferol, (DRISDOL) 1.25 MG (50000 UNIT) CAPS capsule Take 1 capsule (50,000 Units total) by mouth every 7 (seven) days. 12 capsule 0  . allopurinol (ZYLOPRIM) 100 MG  tablet Take 1 tablet (  100 mg total) by mouth daily. For gout 30 tablet 2    Allergies as of 06/24/2020 - Review Complete 06/24/2020  Allergen Reaction Noted  . Demerol Nausea And Vomiting 07/07/2011  . Vasotec [enalapril] Cough 10/13/2012  . Lasix [furosemide] Rash 05/22/2019    Family History  Problem Relation Age of Onset  . Diabetes Mother   . Cancer Mother        unknown kind  . Diabetes Sister   . COPD Sister   . Liver disease Brother   . COPD Brother   . Diabetes Maternal Grandmother   . Diabetes Brother   . COPD Sister   . Healthy Daughter   . Healthy Daughter     Social History   Socioeconomic History  . Marital status: Divorced    Spouse name: Not on file  . Number of children: 2  . Years of education: Not on file  . Highest education level: Not on file  Occupational History  . Occupation: RETIRED  Tobacco Use  . Smoking status: Former Smoker    Packs/day: 4.00    Years: 20.00    Pack years: 80.00    Types: Cigarettes    Quit date: 10/14/1982    Years since quitting: 37.7  . Smokeless tobacco: Never Used  . Tobacco comment: 29 yrs ago  Vaping Use  . Vaping Use: Never used  Substance and Sexual Activity  . Alcohol use: No  . Drug use: No  . Sexual activity: Not Currently    Birth control/protection: None  Other Topics Concern  . Not on file  Social History Narrative  . Not on file   Social Determinants of Health   Financial Resource Strain:   . Difficulty of Paying Living Expenses: Not on file  Food Insecurity:   . Worried About Charity fundraiser in the Last Year: Not on file  . Ran Out of Food in the Last Year: Not on file  Transportation Needs:   . Lack of Transportation (Medical): Not on file  . Lack of Transportation (Non-Medical): Not on file  Physical Activity:   . Days of Exercise per Week: Not on file  . Minutes of Exercise per Session: Not on file  Stress:   . Feeling of Stress : Not on file  Social Connections:   . Frequency  of Communication with Friends and Family: Not on file  . Frequency of Social Gatherings with Friends and Family: Not on file  . Attends Religious Services: Not on file  . Active Member of Clubs or Organizations: Not on file  . Attends Archivist Meetings: Not on file  . Marital Status: Not on file  Intimate Partner Violence:   . Fear of Current or Ex-Partner: Not on file  . Emotionally Abused: Not on file  . Physically Abused: Not on file  . Sexually Abused: Not on file    Review of Systems: Gen: See HPI CV: See HPI Resp: See HPI GI: See HPI Heme: See HPI  Physical Exam: Vital signs in last 24 hours: Temp:  [97.8 F (36.6 C)] 97.8 F (36.6 C) (11/29 1042) Pulse Rate:  [70-82] 80 (11/30 0730) Resp:  [18-37] 26 (11/30 0730) BP: (105-149)/(52-90) 147/75 (11/30 0730) SpO2:  [82 %-100 %] 98 % (11/30 0730) FiO2 (%):  [50 %-100 %] 50 % (11/29 1917) Weight:  [90.7 kg] 90.7 kg (11/29 1045)   General:   Alert,  Chronically ill appearing, nasal canula in place, some shortness of  breath with increased talking, no acute distress. Pleasant and cooperative. Lips are pale.  Head:  Normocephalic and atraumatic. Eyes:  Sclera clear, no icterus.  Ears:  Decreased auditory acuity.  Lungs:  Decreased breath sounds in right mid and lower lung fields with crackles. Also with crackles in the left lower lung field. No wheezes or rhonchi. Increased work of breathing following rectal exam with turning over in the bed, decline in O2 saturation to 86% briefly, returned to 90% after patient laid still.  Heart:  Regular rate and rhythm; no murmurs, clicks, rubs,  or gallops. Abdomen:  Soft, nontender and nondistended. No masses, hepatosplenomegaly or hernias noted. Normal bowel sounds, without guarding, and without rebound.   Rectal:  External hemorrhoids and skin tags, small amount of stool in the rectal vault, stool was brown on gloved finger. Hemoccult card was positive.  Msk:  Symmetrical  without gross deformities. Normal posture. Extremities:  With 1+ bilateral LE pitting edema. Neurologic:  Alert and  oriented x4;  grossly normal neurologically. Skin:  Intact without significant lesions or rashes. Psych:  Normal mood and affect.  Intake/Output from previous day: 11/29 0701 - 11/30 0700 In: -  Out: 600 [Urine:600] Intake/Output this shift: No intake/output data recorded.  Lab Results: Recent Labs    06/24/20 1402 06/25/20 0208  WBC 26.2* 26.3*  HGB 7.5* 7.0*  HCT 25.1* 22.8*  PLT 239 208   BMET Recent Labs    06/24/20 1402 06/24/20 2319 06/25/20 0208  NA 135 137 138  K 4.1 4.1 3.7  CL 99 104 106  CO2 20* 19* 19*  GLUCOSE 405* 317* 297*  BUN 46* 46* 45*  CREATININE 2.51* 2.44* 2.20*  CALCIUM 8.6* 8.1* 8.0*   LFT Recent Labs    06/24/20 1402 06/25/20 0208  PROT 7.4 6.6  ALBUMIN 2.9* 2.6*  AST 147* 61*  ALT 130* 96*  ALKPHOS 146* 120  BILITOT 2.1* 1.8*   PT/INR Recent Labs    06/24/20 1402  LABPROT 27.4*  INR 2.7*   Studies/Results: DG Chest Port 1 View  Result Date: 06/24/2020 CLINICAL DATA:  Shortness of breath. EXAM: PORTABLE CHEST 1 VIEW COMPARISON:  Dec 12, 2019. FINDINGS: Stable cardiomediastinal silhouette. No pneumothorax or pleural effusion is noted. Bilateral patchy airspace opacities are now noted concerning for multifocal pneumonia. Bony thorax is unremarkable IMPRESSION: Bilateral patchy airspace opacities are noted concerning for multifocal pneumonia. Electronically Signed   By: Marijo Conception M.D.   On: 06/24/2020 11:37    Impression:  74 year old male with a history of paroxysmal atrial fibrillation on apixaban, CKD stage III, hypertension, diastolic CHF, hyperlipidemia, diabetes mellitus type 2, macrocytic anemia following with hematology s/p iron injections and receiving Retacrit, and adenomatous colon polyps in 2011 who was admitted 11/29 with sepsis, acute respiratory failure in the setting of pneumonia and pulmonary  edema with acute on chronic diastolic CHF.  Patient also endorsed intermittent hematochezia for the last month and had hemoglobin of 7.5 on admission, down from baseline hemoglobin of 9-10 range.  Hematochezia with acute on chronic anemia: Few year history of intermittent toilet tissue hematochezia occurring 2-3 days, then off for 2-3 days.  Last episode yesterday.  Occasional raw sensation of the rectum with slight discomfort at this time.  Denies constipation, diarrhea, or abdominal pain.  Last colonoscopy in 2011, overdue for repeat exam.  Denies family history of colon cancer.  Rectal exam with external hemorrhoids, skin tags, and small amount of soft stool in the rectal vault  that was brown, Hemoccult positive.  Hemoglobin declined to 7.0 this morning and 1 unit PRBCs has been ordered.  Intermittent hematochezia could be secondary to hemorrhoids in the setting of chronic anticoagulation.  Cannot rule out colon polyps or malignancy.  Patient is going to need colonoscopy at some point; however, due to acute illness, he is not a candidate for procedure at this time. Notably, last dose of Eliquis was 12:59 am today.   Elevated LFTs: History of intermittently elevated LFTs dating back to 2012 and fatty liver.  More recently, LFTs have been within normal range.  In the ED, he was found to have AST 147, ALT 130, alk phos 146, total bilirubin 2.1.  Repeat labs this morning with AST 61, ALT 96, alk phos 120, total bilirubin 1.8.  Denies alcohol use, illicit drug use, significant Tylenol use, or OTC supplements.  History of cholecystectomy.  Denies RUQ abdominal pain.  LFTs may be elevated in the setting of acute illness/hypoxia. Will obtain acute Hepatitis panel and [place]. Otherwise, we will continue to follow this.    Plan: 1. Clear liquid diet.  2. Agree with transfusing 1 unit PRBCs. 3. Add Anusol rectal cream twice daily. 4. Continue to hold Eliquis for now. 5. Monitor for overt bleeding. 6. Monitor H/H.   7. Transfuse as needed.  8. Monitor LFTs. Will order HFP for tomorrow am.  9. Acute hepatitis panel 10. RUQ [place]  11. He will need updated colonoscopy once over acute illness.     LOS: 1 day    06/25/2020, 8:56 AM   Aliene Altes, [facility]

## 2020-06-25 NOTE — Progress Notes (Signed)
Inpatient Diabetes Program Recommendations  AACE/ADA: New Consensus Statement on Inpatient Glycemic Control   Target Ranges:  Prepandial:   less than 140 mg/dL      Peak postprandial:   less than 180 mg/dL (1-2 hours)      Critically ill patients:  140 - 180 mg/dL   Results for KEASTON, PILE (MRN [id number]) as of 06/25/2020 07:50  Ref. Range 06/25/2020 00:23 06/25/2020 03:17 06/25/2020 07:45  Glucose-Capillary Latest Ref Range: 70 - 99 mg/dL 275 (H) 228 (H) 212 (H)  Results for MONTERRIO, GERST (MRN [id number]) as of 06/25/2020 07:50  Ref. Range 04/30/2020 11:45  Hemoglobin A1C Latest Ref Range: 4.0 - 5.6 % 6.2 (A)   Review of Glycemic Control  Diabetes history: DM2 Outpatient Diabetes medications: Jardiance 10 mg QAM, Glipizide 10 mg BID Current orders for Inpatient glycemic control: Novolog 0-15 units Q4H  Inpatient Diabetes Program Recommendations:    Insulin: Please consider ordering Levemir 9 units Q24H (based on 90.7kg x 0.1 units).  Thanks, Barnie Alderman, RN, MSN, CDE Diabetes Coordinator Inpatient Diabetes Program [phone] (Team Pager from 8am to 5pm)

## 2020-06-25 NOTE — Progress Notes (Signed)
PROGRESS NOTE  Jack Mejia KDT:[id number] DOB: 1945/11/25 DOA: 06/24/2020 PCP: Erven Colla, DO  Brief History:  74 year old male with a history of paroxysmal atrial fibrillation on apixaban, CKD stage III, hypertension, diastolic CHF, hyperlipidemia, diabetes mellitus type 2 presenting with at least 1 month history of shortness of breath that has worsened over the last 3 to 4 days.  The patient lives with a significant other whom contacted EMS because of his worsening shortness of breath.  Unfortunately, the patient is a difficult historian.  Nevertheless, the patient has been sleeping on a recliner for the last several days secondary to orthopnea type symptoms.  He has chronic lower extremity edema, but feels that the edema is not changed much.  He denies any chest pain, hemoptysis, nausea, vomiting, diarrhea, abdominal pain.  He has had some subjective fevers and chills.  He has had some suprapubic pain with dysuria.  He is unable to clarify how long this has been going on.  He denies any frank hematuria.  He endorses some intermittent hematochezia for the past month. Upon arrival in the emergency department, the patient was noted to have oxygen saturation of 82% on room air.  He was placed on 7 L with some improvement, but continued to have increased work of breathing.  He is subsequently placed on BiPAP.  Chest x-ray showed bilateral patchy infiltrates.  Patient was started on ceftriaxone and azithromycin.  BMP showed serum creatinine 2.44.  AST 61, ALT 96, alk phosphatase 120, total bilirubin 1.8.  WBC 26.2, hemoglobin 10.5, platelets 239,000.  Assessment/Plan: Acute respiratory failure with hypoxia -Secondary to pneumonia and pulmonary edema -Currently on 15 L HFNC -Wean oxygen as tolerated for saturation greater 92%  Acute on chronic diastolic CHF -The patient is clinically fluid overloaded -Given his allergy to furosemide, the patient will be started on IV Bumex 2 mg IV  every 12 hours -He has been able to tolerate torsemide at home -Daily weights -Accurate I's and O's -05/08/2020 echo EF 65 to 70%, no WMA, grade 1 DD, trivial MR, moderate AAS  Uncontrolled diabetes mellitus type 2 with hyperglycemia -Hemoglobin A1c -NovoLog sliding scale -Holding glipizide  Lobar pneumonia -COVID-19 PCR negative -Continue ceftriaxone and doxycycline -Check procalcitonin  Severe sepsis -Present on admission -Presented with leukocytosis and tachypnea -Secondary to pneumonia -Check procalcitonin -Lactic acid peaked 3.4 -Continue ceftriaxone and azithromycin pending culture data  Acute on chronic renal failure CKD stage IIIb -Baseline creatinine 2.0-2.2 -Presented with serum creatinine 2.51 -Monitor closely with diuresis  Hematochezia/ABLA -GI consult -baseline Hgb 9-10 -presented with Hgb 7.5 -transfuse one unit PRBC  Paroxysmal atrial fibrillation -holding apixaban due to hematochezia -Continue Cardizem CD -Continue amiodarone  Essential hypertension -Anticipate improvement with diuresis -Continue diltiazem CD -Continue metoprolol heart rate  Hyperlipidemia -continue statin  Dysuria -obtain UA/culture   Status is: Inpatient  Remains inpatient appropriate because:IV treatments appropriate due to intensity of illness or inability to take PO   Dispo: The patient is from: Home              Anticipated d/c is to: Home              Anticipated d/c date is: 2 days              Patient currently is not medically stable to d/c.        Family Communication:  no Family at bedside  Consultants:  none  Code Status:  FULL  DVT Prophylaxis: SCDs   Procedures: As Listed in Progress Note Above  Antibiotics: None       Subjective:   Objective: Vitals:   06/25/20 0600 06/25/20 0630 06/25/20 0700 06/25/20 0730  BP: (!) 147/63 (!) 141/64 (!) 144/67 (!) 147/75  Pulse: 75 75 76 80  Resp: (!) 27 (!) 24 (!) 30 (!) 26  Temp:       TempSrc:      SpO2: 96% 99% 96% 98%  Weight:      Height:        Intake/Output Summary (Last 24 hours) at 06/25/2020 0805 Last data filed at 06/24/2020 2124 Gross per 24 hour  Intake --  Output 600 ml  Net -600 ml   Weight change:  Exam:   General:  Pt is alert, follows commands appropriately, not in acute distress  HEENT: No icterus, No thrush, No neck mass, [place]/AT  Cardiovascular: RRR, S1/S2, no rubs, no gallops  Respiratory: CTA bilaterally, no wheezing, no crackles, no rhonchi  Abdomen: Soft/+BS, non tender, non distended, no guarding  Extremities: No edema, No lymphangitis, No petechiae, No rashes, no synovitis   Data Reviewed: I have personally reviewed following labs and imaging studies Basic Metabolic Panel: Recent Labs  Lab 06/18/20 1122 06/24/20 1402 06/24/20 2319 06/25/20 0208  NA 139 135 137 138  K 4.0 4.1 4.1 3.7  CL 99 99 104 106  CO2 20 20* 19* 19*  GLUCOSE 190* 405* 317* 297*  BUN 19 46* 46* 45*  CREATININE 2.04* 2.51* 2.44* 2.20*  CALCIUM 8.6 8.6* 8.1* 8.0*  MG  --   --  2.6*  --    Liver Function Tests: Recent Labs  Lab 06/24/20 1402 06/25/20 0208  AST 147* 61*  ALT 130* 96*  ALKPHOS 146* 120  BILITOT 2.1* 1.8*  PROT 7.4 6.6  ALBUMIN 2.9* 2.6*   No results for input(s): LIPASE, AMYLASE in the last 168 hours. No results for input(s): AMMONIA in the last 168 hours. Coagulation Profile: Recent Labs  Lab 06/24/20 1402  INR 2.7*   CBC: Recent Labs  Lab 06/24/20 1402 06/25/20 0208  WBC 26.2* 26.3*  NEUTROABS 20.2*  --   HGB 7.5* 7.0*  HCT 25.1* 22.8*  MCV 121.3* 121.3*  PLT 239 208   Cardiac Enzymes: No results for input(s): CKTOTAL, CKMB, CKMBINDEX, TROPONINI in the last 168 hours. BNP: Invalid input(s): POCBNP CBG: Recent Labs  Lab 06/25/20 0023 06/25/20 0317 06/25/20 0745  GLUCAP 275* 228* 212*   HbA1C: No results for input(s): HGBA1C in the last 72 hours. Urine analysis:    Component Value Date/Time    COLORURINE COLORLESS (A) 06/06/2020 1151   APPEARANCEUR CLEAR 06/06/2020 1151   LABSPEC 1.013 06/06/2020 1151   PHURINE 5.0 06/06/2020 1151   GLUCOSEU >=500 (A) 06/06/2020 1151   HGBUR MODERATE (A) 06/06/2020 1151   BILIRUBINUR NEGATIVE 06/06/2020 1151   KETONESUR NEGATIVE 06/06/2020 1151   PROTEINUR NEGATIVE 06/06/2020 1151   NITRITE NEGATIVE 06/06/2020 1151   LEUKOCYTESUR NEGATIVE 06/06/2020 1151   Sepsis Labs: $[id number]'@[id number]'jGELymlGeSb$ (procalcitonin:4,lacticidven:4) ) Recent Results (from the past 240 hour(s))  Resp Panel by RT-PCR (Flu A&B, Covid) Nasopharyngeal Swab     Status: None   Collection Time: 06/24/20 11:43 AM   Specimen: Nasopharyngeal Swab; Nasopharyngeal(NP) swabs in vial transport medium  Result Value Ref Range Status   SARS Coronavirus 2 by RT PCR NEGATIVE NEGATIVE Final    Comment: (NOTE) SARS-CoV-2 target nucleic acids are NOT DETECTED.  The SARS-CoV-2 RNA  is generally detectable in upper respiratory specimens during the acute phase of infection. The lowest concentration of SARS-CoV-2 viral copies this assay can detect is 138 copies/mL. A negative result does not preclude SARS-Cov-2 infection and should not be used as the sole basis for treatment or other patient management decisions. A negative result may occur with  improper specimen collection/handling, submission of specimen other than nasopharyngeal swab, presence of viral mutation(s) within the areas targeted by this assay, and inadequate number of viral copies(<138 copies/mL). A negative result must be combined with clinical observations, patient history, and epidemiological information. The expected result is Negative.  Fact Sheet for Patients:  EntrepreneurPulse.com.au  Fact Sheet for Healthcare Providers:  IncredibleEmployment.be  This test is no t yet approved or cleared by the [place] FDA and  has been authorized for detection and/or diagnosis of SARS-CoV-2 by FDA  under an Emergency Use Authorization (EUA). This EUA will remain  in effect (meaning this test can be used) for the duration of the COVID-19 declaration under Section 564(b)(1) of the Act, 21 U.S.C.section 360bbb-3(b)(1), unless the authorization is terminated  or revoked sooner.       Influenza A by PCR NEGATIVE NEGATIVE Final   Influenza B by PCR NEGATIVE NEGATIVE Final    Comment: (NOTE) The Xpert Xpress SARS-CoV-2/FLU/RSV plus assay is intended as an aid in the diagnosis of influenza from Nasopharyngeal swab specimens and should not be used as a sole basis for treatment. Nasal washings and aspirates are unacceptable for Xpert Xpress SARS-CoV-2/FLU/RSV testing.  Fact Sheet for Patients: EntrepreneurPulse.com.au  Fact Sheet for Healthcare Providers: IncredibleEmployment.be  This test is not yet approved or cleared by the [place] FDA and has been authorized for detection and/or diagnosis of SARS-CoV-2 by FDA under an Emergency Use Authorization (EUA). This EUA will remain in effect (meaning this test can be used) for the duration of the COVID-19 declaration under Section 564(b)(1) of the Act, 21 U.S.C. section 360bbb-3(b)(1), unless the authorization is terminated or revoked.  Performed at [facility], [address] [place], [place] [address]   Blood culture (routine x 2)     Status: None (Preliminary result)   Collection Time: 06/24/20  2:04 PM   Specimen: Blood  Result Value Ref Range Status   Specimen Description   Final    LEFT ANTECUBITAL BOTTLES DRAWN AEROBIC AND ANAEROBIC   Special Requests   Final    Blood Culture adequate volume Performed at [facility], [address] [place], [place] [address]    Culture PENDING  Incomplete   Report Status PENDING  Incomplete  Blood culture (routine x 2)     Status: None (Preliminary result)   Collection Time: 06/24/20  2:04 PM   Specimen: Blood  Result Value Ref Range Status    Specimen Description   Final    BLOOD LEFT HAND BOTTLES DRAWN AEROBIC AND ANAEROBIC   Special Requests   Final    Blood Culture adequate volume Performed at [facility], [address] [place], [place] [address]    Culture PENDING  Incomplete   Report Status PENDING  Incomplete     Scheduled Meds: . amiodarone  200 mg Oral Daily  . apixaban  5 mg Oral BID  . atorvastatin  20 mg Oral Daily  . diltiazem  360 mg Oral Daily  . insulin aspart  0-15 Units Subcutaneous Q4H  . insulin detemir  10 Units Subcutaneous Daily  . metoprolol tartrate  25 mg Oral BID  Continuous Infusions: . cefTRIAXone (ROCEPHIN)  IV Stopped (06/25/20 0327)  . doxycycline (VIBRAMYCIN) IV Stopped (06/25/20 0258)    Procedures/Studies: DG Elbow Complete Left  Result Date: 06/06/2020 CLINICAL DATA:  Pain following fall EXAM: LEFT ELBOW - COMPLETE 3+ VIEW COMPARISON:  None. FINDINGS: Frontal, lateral, and bilateral oblique views were obtained. No fracture, dislocation, or joint effusion. There is spurring along the medial distal humeral condyle. No appreciable joint space narrowing or erosion. IMPRESSION: Spurring along the medial distal humeral condyle. No appreciable joint space narrowing. No fracture or dislocation. Electronically Signed   By: Lowella Grip III M.D.   On: 06/06/2020 12:18   US RENAL  Result Date: 06/17/2020 CLINICAL DATA:  Chronic renal disease stage IV EXAM: RENAL / URINARY TRACT ULTRASOUND COMPLETE COMPARISON:  April 21, 2019 FINDINGS: Right Kidney: Renal measurements: 10.7 cm x 5.2 cm x 5.5 cm = volume: 162.9 mL. Echogenicity within normal limits. A 3.1 cm x 3.3 cm x 2.7 cm complex anechoic structure is seen within the mid right kidney. No hydronephrosis is visualized. Left Kidney: Renal measurements: 12.3 cm x 7.2 cm x 6.1 cm = volume: 283.3 mL. Echogenicity within normal limits. 2.1 cm x 2.1 cm x 2.3 cm and 3.0 cm x 2.1 cm x 2.4 cm anechoic structures are seen within the mid and lower  left kidney. An 8.5 mm shadowing echogenic focus is also seen within the lower pole of the left kidney. No hydronephrosis is visualized. Bladder: Appears normal for degree of bladder distention. Bilateral ureteral jets are visualized. Other: None. IMPRESSION: 1. Bilateral renal cysts, mildly increased in size when compared to the prior study. 2. Subcentimeter non-obstructing renal stone within the lower pole of the left kidney. Electronically Signed   By: Virgina Norfolk M.D.   On: 06/17/2020 23:57   DG Chest Port 1 View  Result Date: 06/24/2020 CLINICAL DATA:  Shortness of breath. EXAM: PORTABLE CHEST 1 VIEW COMPARISON:  Dec 12, 2019. FINDINGS: Stable cardiomediastinal silhouette. No pneumothorax or pleural effusion is noted. Bilateral patchy airspace opacities are now noted concerning for multifocal pneumonia. Bony thorax is unremarkable IMPRESSION: Bilateral patchy airspace opacities are noted concerning for multifocal pneumonia. Electronically Signed   By: Marijo Conception M.D.   On: 06/24/2020 11:37   DG Hip Unilat W or Wo Pelvis 2-3 Views Left  Result Date: 06/06/2020 CLINICAL DATA:  Left hip pain since a fall 06/03/2020. Initial encounter. EXAM: DG HIP (WITH OR WITHOUT PELVIS) 2-3V LEFT COMPARISON:  Plain films left hip 03/23/2020. FINDINGS: There is no acute bony or joint abnormality. Hip joint spaces are preserved. Scattered enthesopathic change noted. IMPRESSION: No acute abnormality. Electronically Signed   By: Inge Rise M.D.   On: 06/06/2020 12:18    Orson Eva, DO  [facility]  If 7PM-7AM, please contact night-coverage [email or web address] Password [email or web address] 06/25/2020, 8:05 AM   LOS: 1 day

## 2020-06-25 NOTE — Progress Notes (Signed)
Came into room to give PRN treatment via RN request.  Patient's BS were clear/diminished but he did have some upper airway wheeze so I went ahead and gave treatment.  Prior to treatment, patient had desat episode and had been on 12L [place] with a sat in upper 80s.  Raised to 15L and patient sat came up to 90% before treatment.  Kept patient on [place] and after breathing treatment, patient sat was in upper 90s, but as soon as I took the mask off from treatment, patient dropped down to lower 90s with a rate in 30s.  Spoke with RN and asked to not give patient anything else to eat or drink in the likelihood that we may have to put him back on Bipap.  Sat is currently holding at 93% on 15L [place], will continue to monitor.

## 2020-06-25 NOTE — TOC Initial Note (Signed)
Transition of Care [facility]) - Initial/Assessment Note    Patient Details  Name: Jack Mejia MRN: [id number] Date of Birth: [birth year]  Transition of Care [facility]) CM/SW Contact:    Iona Beard, [place] Phone Number: 06/25/2020, 8:46 PM  Clinical Narrative:                 Pt admitted for acute respiratory failure with hypoxia. TOC received consult due to possible HH/DME needs. CSW completed pt assessment with pt in room. Pt states that he lives alone normally but has been staying with friend [name] since he had a call a few weeks ago. Pt states that he can hardly do ADLs independently since fall and friend Lelon Frohlich will assist. Pt states he has not driven since his fall. Pt states that he has not had [facility] services. Pt uses a cane at home and is not on home O2. CSW inquired about pts interest in [facility] services if MD thought pt would benefit from, pt states that he hopes he is able to take care of himself when he is discharged. After chart review CSW found that pt was discharged on 11/12 and set up [facility]. TOC to follow.   Expected Discharge Plan: [place] Barriers to Discharge: Continued Medical Work up   Patient Goals and CMS Choice Patient states their goals for this hospitalization and ongoing recovery are:: Return home   Choice offered to / list presented to : NA  Expected Discharge Plan and Services Expected Discharge Plan: [place] In-house Referral: Clinical Social Work Discharge Planning Services: CM Consult   Living arrangements for the past 2 months: [place]                 DME Arranged: N/A DME Agency: NA                  Prior Living Arrangements/Services Living arrangements for the past 2 months: [place] with:: Friends [name]) Patient language and need for interpreter reviewed:: Yes Do you feel safe going back to the place where you live?: Yes        Care giver support system  in place?: Yes (comment) (David,Ann (Significant other) [phone]) Current home services: Home PT Criminal Activity/Legal Involvement Pertinent to Current Situation/Hospitalization: No - Comment as needed  Activities of Daily Living Home Assistive Devices/Equipment: Cane (specify quad or straight), CBG Meter ADL Screening (condition at time of admission) Patient's cognitive ability adequate to safely complete daily activities?: Yes Is the patient deaf or have difficulty hearing?: Yes Does the patient have difficulty seeing, even when wearing glasses/contacts?: No Does the patient have difficulty concentrating, remembering, or making decisions?: No Patient able to express need for assistance with ADLs?: Yes Does the patient have difficulty dressing or bathing?: Yes Independently performs ADLs?: No Communication: Independent Dressing (OT): Needs assistance Is this a change from baseline?: Pre-admission baseline Grooming: Needs assistance Is this a change from baseline?: Pre-admission baseline Feeding: Needs assistance Is this a change from baseline?: Pre-admission baseline Bathing: Needs assistance Is this a change from baseline?: Pre-admission baseline Toileting: Needs assistance Is this a change from baseline?: Pre-admission baseline In/Out Bed: Needs assistance Is this a change from baseline?: Pre-admission baseline Walks in Home: Needs assistance Is this a change from baseline?: Pre-admission baseline Does the patient have difficulty walking or climbing stairs?: Yes Weakness of Legs: Both Weakness of Arms/Hands: None  Permission Sought/Granted  Emotional Assessment Appearance:: Appears stated age Attitude/Demeanor/Rapport: Engaged Affect (typically observed): Accepting Orientation: : Oriented to Self, Oriented to Place, Oriented to  Time, Oriented to Situation Alcohol / Substance Use: Not Applicable Psych Involvement: No (comment)  Admission  diagnosis:  Elevated LFTs [R79.89] Acute respiratory failure with hypoxia (HCC) [J96.01] Multifocal pneumonia [J18.9] Patient Active Problem List   Diagnosis Date Noted  . Acute on chronic diastolic CHF (congestive heart failure) ([place]) 06/25/2020  . Acute blood loss anemia 06/25/2020  . Acute renal failure superimposed on stage 3b chronic kidney disease ([place]) 06/25/2020  . Hematochezia   . Elevated LFTs   . Acute respiratory failure with hypoxia ([place]) 06/24/2020  . Multifocal pneumonia 06/24/2020  . Contusion of left hip   . General weakness   . Hyperosmolar hyperglycemic state (HHS) ([place]) 06/06/2020  . PAF (paroxysmal atrial fibrillation) ([place]) 06/06/2020  . CKD (chronic kidney disease), stage III ([place]) 06/06/2020  . Acute gout of right knee 05/05/2020  . Anemia, iron deficiency 01/01/2020  . Macrocytic anemia 12/14/2019  . Coronary artery disease involving native coronary artery of native heart without angina pectoris   . Acute-on-chronic kidney injury ([place]) 04/21/2019  . Acute on chronic diastolic (congestive) heart failure ([place]) 04/15/2019  . Atrial fibrillation with rapid ventricular response ([place]) 02/21/2019  . Depression, major, single episode, mild ([place]) 03/01/2017  . Lumbar stenosis with neurogenic claudication 08/06/2016  . Erectile dysfunction 12/05/2012  . Sensory neuropathy 12/05/2012  . Hyperlipidemia LDL goal <100 12/05/2012  . Rectal bleeding 10/13/2012  . HTN (hypertension) 07/07/2011  . Type 2 diabetes with nephropathy ([place]) 07/07/2011  . Fatty liver 07/07/2011   PCP:  Erven Colla, DO Pharmacy:   [id number] - [place], [address] [address] Phone: [phone] Fax: [phone]     Social Determinants of Health (SDOH) Interventions    Readmission Risk Interventions No flowsheet data found.

## 2020-06-26 ENCOUNTER — Inpatient Hospital Stay (HOSPITAL_COMMUNITY): Payer: Medicare HMO

## 2020-06-26 DIAGNOSIS — J9601 Acute respiratory failure with hypoxia: Secondary | ICD-10-CM | POA: Diagnosis not present

## 2020-06-26 DIAGNOSIS — D62 Acute posthemorrhagic anemia: Secondary | ICD-10-CM | POA: Diagnosis not present

## 2020-06-26 LAB — CBC
HCT: 23.5 % — ABNORMAL LOW (ref 39.0–52.0)
Hemoglobin: 7 g/dL — ABNORMAL LOW (ref 13.0–17.0)
MCH: 35.4 pg — ABNORMAL HIGH (ref 26.0–34.0)
MCHC: 29.8 g/dL — ABNORMAL LOW (ref 30.0–36.0)
MCV: 118.7 fL — ABNORMAL HIGH (ref 80.0–100.0)
Platelets: 165 10*3/uL (ref 150–400)
RBC: 1.98 MIL/uL — ABNORMAL LOW (ref 4.22–5.81)
RDW: 21.9 % — ABNORMAL HIGH (ref 11.5–15.5)
WBC: 18.9 10*3/uL — ABNORMAL HIGH (ref 4.0–10.5)
nRBC: 0.2 % (ref 0.0–0.2)

## 2020-06-26 LAB — HEMOGLOBIN AND HEMATOCRIT, BLOOD
HCT: 30.3 % — ABNORMAL LOW (ref 39.0–52.0)
Hemoglobin: 9.5 g/dL — ABNORMAL LOW (ref 13.0–17.0)

## 2020-06-26 LAB — COMPREHENSIVE METABOLIC PANEL
ALT: 62 U/L — ABNORMAL HIGH (ref 0–44)
AST: 39 U/L (ref 15–41)
Albumin: 1.9 g/dL — ABNORMAL LOW (ref 3.5–5.0)
Alkaline Phosphatase: 104 U/L (ref 38–126)
Anion gap: 12 (ref 5–15)
BUN: 28 mg/dL — ABNORMAL HIGH (ref 8–23)
CO2: 19 mmol/L — ABNORMAL LOW (ref 22–32)
Calcium: 6.7 mg/dL — ABNORMAL LOW (ref 8.9–10.3)
Chloride: 110 mmol/L (ref 98–111)
Creatinine, Ser: 1.57 mg/dL — ABNORMAL HIGH (ref 0.61–1.24)
GFR, Estimated: 46 mL/min — ABNORMAL LOW (ref >60)
Glucose, Bld: 159 mg/dL — ABNORMAL HIGH (ref 70–99)
Potassium: 2.7 mmol/L — CL (ref 3.5–5.1)
Sodium: 141 mmol/L (ref 135–145)
Total Bilirubin: 1.3 mg/dL — ABNORMAL HIGH (ref 0.3–1.2)
Total Protein: 5.4 g/dL — ABNORMAL LOW (ref 6.5–8.1)

## 2020-06-26 LAB — STREP PNEUMONIAE URINARY ANTIGEN: Strep Pneumo Urinary Antigen: NEGATIVE

## 2020-06-26 LAB — HEPATITIS PANEL, ACUTE
HCV Ab: NONREACTIVE
Hep A IgM: NONREACTIVE
Hep B C IgM: NONREACTIVE
Hepatitis B Surface Ag: NONREACTIVE

## 2020-06-26 LAB — GLUCOSE, CAPILLARY
Glucose-Capillary: 163 mg/dL — ABNORMAL HIGH (ref 70–99)
Glucose-Capillary: 158 mg/dL — ABNORMAL HIGH (ref 70–99)
Glucose-Capillary: 249 mg/dL — ABNORMAL HIGH (ref 70–99)
Glucose-Capillary: 206 mg/dL — ABNORMAL HIGH (ref 70–99)
Glucose-Capillary: 233 mg/dL — ABNORMAL HIGH (ref 70–99)
Glucose-Capillary: 164 mg/dL — ABNORMAL HIGH (ref 70–99)

## 2020-06-26 LAB — MAGNESIUM: Magnesium: 2 mg/dL (ref 1.7–2.4)

## 2020-06-26 LAB — PREPARE RBC (CROSSMATCH)

## 2020-06-26 MED ORDER — SIMETHICONE 80 MG PO CHEW
80.0000 mg | CHEWABLE_TABLET | Freq: Four times a day (QID) | ORAL | Status: AC
  Administered 2020-06-26 – 2020-06-27 (×4): 80 mg via ORAL
  Filled 2020-06-26 (×4): qty 1

## 2020-06-26 MED ORDER — SODIUM CHLORIDE 0.9% IV SOLUTION: Freq: Once | INTRAVENOUS | Status: AC

## 2020-06-26 MED ORDER — FUROSEMIDE 10 MG/ML IJ SOLN: 40.0000 mg | Freq: Once | INTRAMUSCULAR | Status: DC

## 2020-06-26 MED ORDER — BUMETANIDE 0.25 MG/ML IJ SOLN
2.0000 mg | Freq: Two times a day (BID) | INTRAMUSCULAR | Status: DC
  Administered 2020-06-26 – 2020-07-01 (×11): 2 mg via INTRAVENOUS
  Filled 2020-06-26 (×14): qty 8

## 2020-06-26 MED ORDER — ALPRAZOLAM 0.25 MG PO TABS
0.2500 mg | ORAL_TABLET | Freq: Two times a day (BID) | ORAL | Status: DC | PRN
  Administered 2020-06-26 – 2020-06-28 (×3): 0.25 mg via ORAL
  Filled 2020-06-26 (×4): qty 1

## 2020-06-26 MED ORDER — POTASSIUM CHLORIDE CRYS ER 20 MEQ PO TBCR
40.0000 meq | EXTENDED_RELEASE_TABLET | ORAL | Status: AC
  Administered 2020-06-26 (×2): 40 meq via ORAL
  Filled 2020-06-26 (×2): qty 2

## 2020-06-26 NOTE — Telephone Encounter (Signed)
error 

## 2020-06-26 NOTE — Progress Notes (Signed)
PROGRESS NOTE  Jack Mejia WNU:[id number] DOB: 1946-01-02 DOA: 06/24/2020 PCP: Erven Colla, DO  Brief History:  74 y.o.year old malewith a history of paroxysmal atrial fibrillation on apixaban, CKD stage III, hypertension, diastolic CHF, hyperlipidemia, diabetes mellitus type 2,macrocytic anemia following with hematology s/p iron injection and receiving Retacrit, and adenomatous colon polyps in 2011who was admitted 06/24/20 with sepsis and acuterespiratory failurein the setting of pneumonia and pulmonary edema withacute on chronic diastolic CHF.  Patient was also found to have intermittent rectal bleeding reported and acute on chronic anemia-   Assessment/Plan: Acute respiratory failure with hypoxia -Secondary to pneumonia and pulmonary edema -Currently on 15 L HFNC -Wean oxygen as tolerated for saturation greater 92%  - Acute on chronic diastolic CHF -The patient is clinically fluid overloaded -Volume overload status-worse with transfusion of PRBC -Apparently patient missed his a.m. Bumex dose --Restart IV Bumex 2 mg every 12 hours, apparently patient is intolerant to Lasix -He has been able to tolerate torsemide at home -Daily weights -Accurate I's and O's -05/08/2020 echo EF 65 to 70%, no WMA, grade 1 DD, trivial MR, moderate AAS  DM2- last A1c 8.3 reflecting uncontrolled DM with hyperglycemia -NovoLog sliding scale -Holding glipizide  CAP/Lobar pneumonia -COVID-19 PCR negative -Continue ceftriaxone and doxycycline -Continue bronchodilators and mucolytics  Severe sepsis -Present on admission -Presented with leukocytosis and tachypnea -Secondary to pneumonia as above -Lactic acid peaked 3.4 -Continue  antibiotics as above  Acute on chronic renal failure CKD stage IIIb -Baseline creatinine 2.0-2.2 -Presented with serum creatinine 2.51 -Monitor closely with diuresis AKI----acute kidney injury on CKD stage - 3B--due to sepsis and  hypoperfusion -  creatinine on admission=2.51  , baseline creatinine =2.0     , creatinine is now=1.57  ----renally adjust medications, avoid nephrotoxic agents / dehydration  / hypotension   Hematochezia/ABLA -GI consult -baseline Hgb 9-10 -presented with Hgb 7.5 -Received 1 unit of PRBC, hemoglobin down to 7.0, -Additional unit of PRBC transfused on 06/26/2020 -GI consult appreciated  Paroxysmal atrial fibrillation -holding apixaban due to hematochezia -Continue Cardizem CD -Continue amiodarone  Essential hypertension -Anticipate improvement with diuresis -Continue diltiazem CD -Continue metoprolol heart rate  Hyperlipidemia -continue statin  Dysuria -obtain UA/culture  Hypokalemia/Hypomagnesemia--- replace and recheck, electrolytes abnormalities may persist due to high-dose diuretic use  Status is: Inpatient  Remains inpatient appropriate because:Requiring IV Bumex, very very high flow nasal cannula at 15 L/min   Dispo: The patient is from: Home              Anticipated d/c is to: Home              Anticipated d/c date is: 2 days              Patient currently is not medically stable to d/c.   Family Communication: -Discussed with S/o at [phone] Discussed with daughter  Consultants:  none  Code Status:  FULL  DVT Prophylaxis: SCDs  Procedures: As Listed in Progress Note Above  Antibiotics: None  Subjective: shob persist, no chest pains -- No fever  Or chills   No Nausea, Vomiting or Diarrhea  Objective: Vitals:   06/26/20 1145 06/26/20 1151 06/26/20 1206 06/26/20 1224  BP: (!) 154/61 (!) 154/61 (!) 144/60   Pulse: 76 74 74   Resp: (!) 21 (!) 33 (!) 30   Temp: 98 F (36.7 C)  97.9 F (36.6 C) 97.6 F (36.4 C)  TempSrc: Oral  Oral  Oral  SpO2: (!) 86% (!) 88%    Weight:      Height:        Intake/Output Summary (Last 24 hours) at 06/26/2020 1439 Last data filed at 06/26/2020 1100 Gross per 24 hour  Intake 2094.15 ml  Output 525 ml   Net 1569.15 ml   Weight change: 3.581 kg Exam:   General:  Pt is alert, follows commands appropriately, not in acute distress  HEENT: No icterus, No thrush, No neck mass, [place]/AT  Nose-- HFNC 15L/min  Cardiovascular: RRR, S1/S2, no rubs, no gallops  Respiratory:  Diminished breath sounds , scattered Rhonchi  Abdomen: Soft/+BS, non tender, non distended, no guarding  Extremities: No edema, No lymphangitis, No petechiae, No rashes, no synovitis  NeuroPsych--- generalized weakness without new focal deficits, affect is appropriate  Data Reviewed: I have personally reviewed following labs and imaging studies  Basic Metabolic Panel: Recent Labs  Lab 06/24/20 1402 06/24/20 2319 06/25/20 0208 06/26/20 0400  NA 135 137 138 141  K 4.1 4.1 3.7 2.7*  CL 99 104 106 110  CO2 20* 19* 19* 19*  GLUCOSE 405* 317* 297* 159*  BUN 46* 46* 45* 28*  CREATININE 2.51* 2.44* 2.20* 1.57*  CALCIUM 8.6* 8.1* 8.0* 6.7*  MG  --  2.6*  --  2.0   Liver Function Tests: Recent Labs  Lab 06/24/20 1402 06/25/20 0208 06/26/20 0400  AST 147* 61* 39  ALT 130* 96* 62*  ALKPHOS 146* 120 104  BILITOT 2.1* 1.8* 1.3*  PROT 7.4 6.6 5.4*  ALBUMIN 2.9* 2.6* 1.9*   No results for input(s): LIPASE, AMYLASE in the last 168 hours. No results for input(s): AMMONIA in the last 168 hours. Coagulation Profile: Recent Labs  Lab 06/24/20 1402  INR 2.7*   CBC: Recent Labs  Lab 06/24/20 1402 06/25/20 0208 06/26/20 0400  WBC 26.2* 26.3* 18.9*  NEUTROABS 20.2*  --   --   HGB 7.5* 7.0* 7.0*  HCT 25.1* 22.8* 23.5*  MCV 121.3* 121.3* 118.7*  PLT 239 208 165   Cardiac Enzymes: No results for input(s): CKTOTAL, CKMB, CKMBINDEX, TROPONINI in the last 168 hours. BNP: Invalid input(s): POCBNP CBG: Recent Labs  Lab 06/25/20 2014 06/25/20 2337 06/26/20 0404 06/26/20 0727 06/26/20 1129  GLUCAP 274* 242* 163* 158* 249*   HbA1C: Recent Labs    06/25/20 0208  HGBA1C 8.3*   Urine analysis:     Component Value Date/Time   COLORURINE COLORLESS (A) 06/06/2020 1151   APPEARANCEUR CLEAR 06/06/2020 1151   LABSPEC 1.013 06/06/2020 1151   PHURINE 5.0 06/06/2020 1151   GLUCOSEU >=500 (A) 06/06/2020 1151   HGBUR MODERATE (A) 06/06/2020 1151   BILIRUBINUR NEGATIVE 06/06/2020 1151   KETONESUR NEGATIVE 06/06/2020 1151   PROTEINUR NEGATIVE 06/06/2020 1151   NITRITE NEGATIVE 06/06/2020 1151   LEUKOCYTESUR NEGATIVE 06/06/2020 1151   Sepsis Labs: @LABRCNTIP (procalcitonin:4,lacticidven:4) ) Recent Results (from the past 240 hour(s))  Resp Panel by RT-PCR (Flu A&B, Covid) Nasopharyngeal Swab     Status: None   Collection Time: 06/24/20 11:43 AM   Specimen: Nasopharyngeal Swab; Nasopharyngeal(NP) swabs in vial transport medium  Result Value Ref Range Status   SARS Coronavirus 2 by RT PCR NEGATIVE NEGATIVE Final    Comment: (NOTE) SARS-CoV-2 target nucleic acids are NOT DETECTED.  The SARS-CoV-2 RNA is generally detectable in upper respiratory specimens during the acute phase of infection. The lowest concentration of SARS-CoV-2 viral copies this assay can detect is 138 copies/mL. A negative result does not preclude  SARS-Cov-2 infection and should not be used as the sole basis for treatment or other patient management decisions. A negative result may occur with  improper specimen collection/handling, submission of specimen other than nasopharyngeal swab, presence of viral mutation(s) within the areas targeted by this assay, and inadequate number of viral copies(<138 copies/mL). A negative result must be combined with clinical observations, patient history, and epidemiological information. The expected result is Negative.  Fact Sheet for Patients:  EntrepreneurPulse.com.au  Fact Sheet for Healthcare Providers:  IncredibleEmployment.be  This test is no t yet approved or cleared by the [place] FDA and  has been authorized for detection and/or  diagnosis of SARS-CoV-2 by FDA under an Emergency Use Authorization (EUA). This EUA will remain  in effect (meaning this test can be used) for the duration of the COVID-19 declaration under Section 564(b)(1) of the Act, 21 U.S.C.section 360bbb-3(b)(1), unless the authorization is terminated  or revoked sooner.       Influenza A by PCR NEGATIVE NEGATIVE Final   Influenza B by PCR NEGATIVE NEGATIVE Final    Comment: (NOTE) The Xpert Xpress SARS-CoV-2/FLU/RSV plus assay is intended as an aid in the diagnosis of influenza from Nasopharyngeal swab specimens and should not be used as a sole basis for treatment. Nasal washings and aspirates are unacceptable for Xpert Xpress SARS-CoV-2/FLU/RSV testing.  Fact Sheet for Patients: EntrepreneurPulse.com.au  Fact Sheet for Healthcare Providers: IncredibleEmployment.be  This test is not yet approved or cleared by the [place] FDA and has been authorized for detection and/or diagnosis of SARS-CoV-2 by FDA under an Emergency Use Authorization (EUA). This EUA will remain in effect (meaning this test can be used) for the duration of the COVID-19 declaration under Section 564(b)(1) of the Act, 21 U.S.C. section 360bbb-3(b)(1), unless the authorization is terminated or revoked.  Performed at [facility], [address] [place], [place] [address]   Blood culture (routine x 2)     Status: None (Preliminary result)   Collection Time: 06/24/20  2:04 PM   Specimen: Left Antecubital; Blood  Result Value Ref Range Status   Specimen Description   Final    LEFT ANTECUBITAL BOTTLES DRAWN AEROBIC AND ANAEROBIC   Special Requests Blood Culture adequate volume  Final   Culture   Final    NO GROWTH < 24 HOURS Performed at [facility], [address], [place], [place] [address]    Report Status PENDING  Incomplete  Blood culture (routine x 2)     Status: None (Preliminary result)   Collection Time: 06/24/20  2:04  PM   Specimen: BLOOD LEFT HAND  Result Value Ref Range Status   Specimen Description   Final    BLOOD LEFT HAND BOTTLES DRAWN AEROBIC AND ANAEROBIC   Special Requests Blood Culture adequate volume  Final   Culture   Final    NO GROWTH < 24 HOURS Performed at [facility], [address] [place], [place] [address]    Report Status PENDING  Incomplete  MRSA PCR Screening     Status: None   Collection Time: 06/25/20  2:36 PM   Specimen: Nasopharyngeal  Result Value Ref Range Status   MRSA by PCR NEGATIVE NEGATIVE Final    Comment:        The GeneXpert MRSA Assay (FDA approved for NASAL specimens only), is one component of a comprehensive MRSA colonization surveillance program. It is not intended to diagnose MRSA infection nor to guide or monitor treatment for MRSA infections. Performed at [facility]  [facility], [address], [place], [place] [address]      Scheduled Meds: . sodium chloride   Intravenous Once  . amiodarone  200 mg Oral Daily  . atorvastatin  20 mg Oral Daily  . bumetanide (BUMEX) IV  2 mg Intravenous Q12H  . Chlorhexidine Gluconate Cloth  6 each Topical Daily  . diltiazem  360 mg Oral Daily  . hydrocortisone   Rectal BID  . insulin aspart  0-15 Units Subcutaneous Q4H  . insulin detemir  10 Units Subcutaneous Daily  . mouth rinse  15 mL Mouth Rinse BID  . metoprolol tartrate  25 mg Oral BID  . potassium chloride  40 mEq Oral Q3H   Continuous Infusions: . cefTRIAXone (ROCEPHIN)  IV Stopped (06/26/20 0422)  . doxycycline (VIBRAMYCIN) IV Stopped (06/26/20 0557)    Procedures/Studies: DG Elbow Complete Left  Result Date: 06/06/2020 CLINICAL DATA:  Pain following fall EXAM: LEFT ELBOW - COMPLETE 3+ VIEW COMPARISON:  None. FINDINGS: Frontal, lateral, and bilateral oblique views were obtained. No fracture, dislocation, or joint effusion. There is spurring along the medial distal humeral condyle. No appreciable joint space narrowing or erosion. IMPRESSION: Spurring  along the medial distal humeral condyle. No appreciable joint space narrowing. No fracture or dislocation. Electronically Signed   By: Lowella Grip III M.D.   On: 06/06/2020 12:18   US RENAL  Result Date: 06/17/2020 CLINICAL DATA:  Chronic renal disease stage IV EXAM: RENAL / URINARY TRACT ULTRASOUND COMPLETE COMPARISON:  April 21, 2019 FINDINGS: Right Kidney: Renal measurements: 10.7 cm x 5.2 cm x 5.5 cm = volume: 162.9 mL. Echogenicity within normal limits. A 3.1 cm x 3.3 cm x 2.7 cm complex anechoic structure is seen within the mid right kidney. No hydronephrosis is visualized. Left Kidney: Renal measurements: 12.3 cm x 7.2 cm x 6.1 cm = volume: 283.3 mL. Echogenicity within normal limits. 2.1 cm x 2.1 cm x 2.3 cm and 3.0 cm x 2.1 cm x 2.4 cm anechoic structures are seen within the mid and lower left kidney. An 8.5 mm shadowing echogenic focus is also seen within the lower pole of the left kidney. No hydronephrosis is visualized. Bladder: Appears normal for degree of bladder distention. Bilateral ureteral jets are visualized. Other: None. IMPRESSION: 1. Bilateral renal cysts, mildly increased in size when compared to the prior study. 2. Subcentimeter non-obstructing renal stone within the lower pole of the left kidney. Electronically Signed   By: Virgina Norfolk M.D.   On: 06/17/2020 23:57   DG Chest Port 1 View  Result Date: 06/24/2020 CLINICAL DATA:  Shortness of breath. EXAM: PORTABLE CHEST 1 VIEW COMPARISON:  Dec 12, 2019. FINDINGS: Stable cardiomediastinal silhouette. No pneumothorax or pleural effusion is noted. Bilateral patchy airspace opacities are now noted concerning for multifocal pneumonia. Bony thorax is unremarkable IMPRESSION: Bilateral patchy airspace opacities are noted concerning for multifocal pneumonia. Electronically Signed   By: Marijo Conception M.D.   On: 06/24/2020 11:37   DG Hip Unilat W or Wo Pelvis 2-3 Views Left  Result Date: 06/06/2020 CLINICAL DATA:  Left  hip pain since a fall 06/03/2020. Initial encounter. EXAM: DG HIP (WITH OR WITHOUT PELVIS) 2-3V LEFT COMPARISON:  Plain films left hip 03/23/2020. FINDINGS: There is no acute bony or joint abnormality. Hip joint spaces are preserved. Scattered enthesopathic change noted. IMPRESSION: No acute abnormality. Electronically Signed   By: Inge Rise M.D.   On: 06/06/2020 12:18   US Abdomen Limited RUQ (LIVER/GB)  Result Date: 06/25/2020  CLINICAL DATA:  Elevated LFTs, post cholecystectomy EXAM: ULTRASOUND ABDOMEN LIMITED RIGHT UPPER QUADRANT COMPARISON:  September of 2018 CT evaluation FINDINGS: Gallbladder: Post cholecystectomy. Common bile duct: Diameter: 4.2 mm Liver: Mildly increased echogenicity of hepatic parenchyma without visible lesion on limited images. Portal vein is patent on color Doppler imaging with normal direction of blood flow towards the liver. Other: RIGHT pleural effusion, may be moderate. IMPRESSION: 1. Hepatic steatosis, mild. 2. No biliary duct dilation post cholecystectomy. 3. RIGHT-sided pleural effusion partially imaged. Electronically Signed   By: Zetta Bills M.D.   On: 06/25/2020 15:07    Jniya Madara Denton Brick, MD  [facility]  If 7PM-7AM, please contact night-coverage [email or web address] Password [email or web address] 06/26/2020, 2:39 PM   LOS: 2 days

## 2020-06-26 NOTE — Progress Notes (Addendum)
Subjective: Feels short of breath. No abdominal pain. No overt GI bleeding per nursing staff. Small stool smear this morning without blood.   Objective: Vital signs in last 24 hours: Temp:  [97.5 F (36.4 C)-98.7 F (37.1 C)] 98.1 F (36.7 C) (12/01 0725) Pulse Rate:  [70-84] 73 (12/01 0800) Resp:  [17-30] 17 (12/01 0800) BP: (132-154)/(50-69) 147/58 (12/01 0800) SpO2:  [89 %-97 %] 94 % (12/01 0800) Weight:  [94.3 kg] 94.3 kg (11/30 1046)   General:   Alert and oriented, tachypneic, uncomfortable appearing Head:  Normocephalic and atraumatic. Abdomen:  Bowel sounds present, soft, non-tender, non-distended. No HSM or hernias noted. No rebound or guarding. No masses appreciated  Extremities:  Without  edema. Neurologic:  Alert and  oriented x4   Intake/Output from previous day: 11/30 0701 - 12/01 0700 In: 2854.2 [I.V.:250; Blood:650; IV Piggyback:1954.2] Out: 1761 [Urine:1625] Intake/Output this shift: No intake/output data recorded.  Lab Results: Recent Labs    06/24/20 1402 06/25/20 0208 06/26/20 0400  WBC 26.2* 26.3* 18.9*  HGB 7.5* 7.0* 7.0*  HCT 25.1* 22.8* 23.5*  PLT 239 208 165   BMET Recent Labs    06/24/20 2319 06/25/20 0208 06/26/20 0400  NA 137 138 141  K 4.1 3.7 2.7*  CL 104 106 110  CO2 19* 19* 19*  GLUCOSE 317* 297* 159*  BUN 46* 45* 28*  CREATININE 2.44* 2.20* 1.57*  CALCIUM 8.1* 8.0* 6.7*   LFT Recent Labs    06/24/20 1402 06/25/20 0208 06/26/20 0400  PROT 7.4 6.6 5.4*  ALBUMIN 2.9* 2.6* 1.9*  AST 147* 61* 39  ALT 130* 96* 62*  ALKPHOS 146* 120 104  BILITOT 2.1* 1.8* 1.3*   PT/INR Recent Labs    06/24/20 1402  LABPROT 27.4*  INR 2.7*   Hepatitis Panel Recent Labs    06/25/20 1040  HEPBSAG PENDING  HCVAB NON REACTIVE  HEPAIGM NON REACTIVE  HEPBIGM NON REACTIVE   Lab Results  Component Value Date   IRON 52 06/25/2020   TIBC 213 (L) 06/25/2020   FERRITIN 1,250 (H) 06/25/2020     Studies/Results: DG Chest  Port 1 View  Result Date: 06/24/2020 CLINICAL DATA:  Shortness of breath. EXAM: PORTABLE CHEST 1 VIEW COMPARISON:  Dec 12, 2019. FINDINGS: Stable cardiomediastinal silhouette. No pneumothorax or pleural effusion is noted. Bilateral patchy airspace opacities are now noted concerning for multifocal pneumonia. Bony thorax is unremarkable IMPRESSION: Bilateral patchy airspace opacities are noted concerning for multifocal pneumonia. Electronically Signed   By: Marijo Conception M.D.   On: 06/24/2020 11:37   US Abdomen Limited RUQ (LIVER/GB)  Result Date: 06/25/2020 CLINICAL DATA:  Elevated LFTs, post cholecystectomy EXAM: ULTRASOUND ABDOMEN LIMITED RIGHT UPPER QUADRANT COMPARISON:  September of 2018 CT evaluation FINDINGS: Gallbladder: Post cholecystectomy. Common bile duct: Diameter: 4.2 mm Liver: Mildly increased echogenicity of hepatic parenchyma without visible lesion on limited images. Portal vein is patent on color Doppler imaging with normal direction of blood flow towards the liver. Other: RIGHT pleural effusion, may be moderate. IMPRESSION: 1. Hepatic steatosis, mild. 2. No biliary duct dilation post cholecystectomy. 3. RIGHT-sided pleural effusion partially imaged. Electronically Signed   By: Zetta Bills M.D.   On: 06/25/2020 15:07    Assessment: Jack Mejia is a 74 y.o. year old male with a history of paroxysmal atrial fibrillation on apixaban, CKD stage III, hypertension, diastolic CHF, hyperlipidemia, diabetes mellitus type 2, macrocytic anemia following with hematology s/p iron injection and receiving Retacrit, and adenomatous colon  polyps in 2011 who was admitted 11/29 with sepsis and acute respiratory failure in the setting of pneumonia and pulmonary edema with acute on chronic diastolic CHF. Due to intermittent rectal bleeding reported and acute on chronic anemia, GI was consulted.  Rectal bleeding: in setting of anticoagulation, with last dose of Eliquis 0059 on 11/30. Eliquis now on  hold. 1 unit PRBCs yesterday evening,  with Hgb remaining 7 and not improved. No further overt GI bleeding. Recommend diagnostic colonoscopy +/- EGD once improved from respiratory standpoint. No concern for rapid transit upper GI bleeding at this point; he is without UGI symptoms. Would benefit from additional 1 unit PRBCs, which we will order now. Discussed with hospitalist.    Elevated LFTs: intermittently elevated LFTs dating back to 2012 in setting of fatty liver. Likely elevated in setting of acute illness and improving now. Viral hepatitis all negative but Hep B surface antigen did not result: will order this. [place] RUQ with fatty liver. No further work-up for LFTs while inpatient unless increasing again; need to follow serially.    Plan: Follow H/H Additional 1 unit PRBCs now Eliquis on hold Holding on endoscopic evaluation until improved from respiratory standpoint Will continue to follow with you   Jack Needs, PhD, ANP-BC [facility]    LOS: 2 days    06/26/2020, 9:39 AM

## 2020-06-27 DIAGNOSIS — J189 Pneumonia, unspecified organism: Secondary | ICD-10-CM

## 2020-06-27 DIAGNOSIS — N1832 Chronic kidney disease, stage 3b: Secondary | ICD-10-CM | POA: Diagnosis not present

## 2020-06-27 DIAGNOSIS — R7989 Other specified abnormal findings of blood chemistry: Secondary | ICD-10-CM

## 2020-06-27 DIAGNOSIS — K625 Hemorrhage of anus and rectum: Secondary | ICD-10-CM

## 2020-06-27 DIAGNOSIS — N179 Acute kidney failure, unspecified: Secondary | ICD-10-CM | POA: Diagnosis not present

## 2020-06-27 DIAGNOSIS — J9601 Acute respiratory failure with hypoxia: Secondary | ICD-10-CM

## 2020-06-27 LAB — HEPATITIS B SURFACE ANTIGEN: Hepatitis B Surface Ag: NONREACTIVE

## 2020-06-27 LAB — BPAM RBC
Blood Product Expiration Date: 202201032359
Blood Product Expiration Date: 202201032359
ISSUE DATE / TIME: 202111301704
ISSUE DATE / TIME: 202112011138
Unit Type and Rh: 5100
Unit Type and Rh: 5100

## 2020-06-27 LAB — GLUCOSE, CAPILLARY
Glucose-Capillary: 180 mg/dL — ABNORMAL HIGH (ref 70–99)
Glucose-Capillary: 157 mg/dL — ABNORMAL HIGH (ref 70–99)
Glucose-Capillary: 226 mg/dL — ABNORMAL HIGH (ref 70–99)
Glucose-Capillary: 254 mg/dL — ABNORMAL HIGH (ref 70–99)
Glucose-Capillary: 299 mg/dL — ABNORMAL HIGH (ref 70–99)
Glucose-Capillary: 149 mg/dL — ABNORMAL HIGH (ref 70–99)

## 2020-06-27 LAB — TYPE AND SCREEN
ABO/RH(D): O POS
Antibody Screen: NEGATIVE
Unit division: 0
Unit division: 0

## 2020-06-27 LAB — BRAIN NATRIURETIC PEPTIDE: B Natriuretic Peptide: 625 pg/mL — ABNORMAL HIGH (ref 0.0–100.0)

## 2020-06-27 LAB — SEDIMENTATION RATE: Sed Rate: 130 mm/hr — ABNORMAL HIGH (ref 0–16)

## 2020-06-27 LAB — LEGIONELLA PNEUMOPHILA SEROGP 1 UR AG: L. pneumophila Serogp 1 Ur Ag: NEGATIVE

## 2020-06-27 NOTE — Care Management Important Message (Signed)
Important Message  Patient Details  Name: Jack Mejia MRN: [id number] Date of Birth: May 20, 1946   Medicare Important Message Given:  Yes     Tommy Medal 06/27/2020, 3:35 PM

## 2020-06-27 NOTE — Consult Note (Addendum)
NAME:  Jack Mejia, MRN:  [id number], DOB:  April 16, 1946, LOS: 3 ADMISSION DATE:  06/24/2020, CONSULTATION DATE:  12/2 REFERRING MD:  Denton Brick CHIEF COMPLAINT:  resp failure    Brief History   74 yowm remote smoker on chronic amiodarone rx for afib with worsening doe beginning  6 weeks PTA admitted 11/29 with presumed CAP/ chf with severe respiratory failure and PCCM consultation req 12/2   Baseline = able to push cart around [employer] x 2 aisles s 02   History of present illness    74 y.o. male with medical history significant of paroxysmal atrial fibrillation (status post cardioversion and chronically on apixaban), lumbar stenosis/chronic pain, type 2 diabetes with nephropathy, chronic kidney disease a stage IIIb, hypertension and hyperlipidemia; who presented to the hospital secondary to generalized weakness and increased urinary frequency.  Patient reports decreased appetite and recent visit to his PCP office secondary to acute right knee pain that was treated with prednisone for suspected gout flare.  Patient reports taking medications as prescribed prior to this event reporting stable sugar levels.  Most recent A1c 6.2 (04/30/20)  Patient denied chest pain, shortness of breath, fever, chills, dysuria, hematuria, nausea, vomiting, abdominal pain, focal weakness or any other complaints.  ED Course: No signs of acute infection appreciated.  Patient with acute hyperosmolar hyperglycemic state and normal anion gap.  Stable chronic kidney disease a stage IIIb.  IV fluids and IV insulin therapy initiated.        Past Medical History   Arthritis, Asthma, Atrial fibrillation ([place]), CAD (coronary artery disease) (06/29/2019), Cramps of left lower extremity, Depression, Diabetes mellitus, Dyspnea, Elevated liver enzymes, Fatty liver, History of kidney stones, Hyperlipidemia, Hypertension, Renal insufficiency, and Vertigo.    Significant Hospital Events     Consults:  GI   11/30 PCCM 12/2    Procedures:     Significant Diagnostic Tests:  Outpt 05/08/20 Echo 1. Left ventricular ejection fraction, by estimation, is 65 to 70%. The  left ventricle has normal function. The left ventricle has no regional  wall motion abnormalities. There is mild left ventricular hypertrophy.  Left ventricular diastolic parameters  are consistent with Grade I diastolic dysfunction (impaired relaxation).  2. Right ventricular systolic function is normal. The right ventricular  size is normal. There is mildly elevated pulmonary artery systolic  pressure. The estimated right ventricular systolic pressure is 28.4 mmHg.  3. The mitral valve is grossly normal. Trivial mitral valve  regurgitation.  LA was nl is size  4. The aortic valve is tricuspid. There is moderate calcification of the  aortic valve. Aortic valve regurgitation is not visualized. Moderate  aortic valve stenosis. Aortic valve mean gradient measures 21.5 mmHg.  Aortic valve Vmax measures 3.08 m/s.  Dimentionless index 0.36.  5. The inferior vena cava is normal in size with greater than 50%  respiratory variability, suggesting right atrial pressure of 3 mmHg  ESR 12/2 =   Micro Data:  BC x 2  11/29 >>> RESP viral Panel  PCR 11/29 neg MRSA PCR   11/30  Neg Strep urinary ag  11/30 neg  Legionella urinary ag  11/30 >>>   Antimicrobials:  Vanc IV  11/29 only Maxepime 11/29 only Rocepin 11/29 >>> Doxy 11/29 > 12/2    Scheduled Meds: amiodarone given 12/2 and then d/c'd . sodium chloride   Intravenous Once  . atorvastatin  20 mg Oral Daily  . bumetanide (BUMEX) IV  2 mg Intravenous BID  . Chlorhexidine Gluconate  Cloth  6 each Topical Daily  . diltiazem  360 mg Oral Daily  . hydrocortisone   Rectal BID  . insulin aspart  0-15 Units Subcutaneous Q4H  . insulin detemir  10 Units Subcutaneous Daily  . mouth rinse  15 mL Mouth Rinse BID  . metoprolol tartrate  25 mg Oral BID  . [COMPLETED] simethicone  80 mg Oral QID    Continuous Infusions: . cefTRIAXone (ROCEPHIN)  IV Stopped (06/27/20 0410)  . doxycycline (VIBRAMYCIN) IV 100 mg (06/27/20 1222)   PRN Meds:.acetaminophen **OR** acetaminophen, albuterol, ALPRAZolam, polyethylene glycol  Interim history/subjective:  Better since admit/ 02 rx/ no longer needing bipap, sitting in chair   Objective   Blood pressure 134/68, pulse 68, temperature 98.7 F (37.1 C), temperature source Axillary, resp. rate 19, height $RemoveBe'5\' 10"'[id number]$  (1.778 m), weight 96 kg, SpO2 99 %.        Intake/Output Summary (Last 24 hours) at 06/27/2020 1302 Last data filed at 06/27/2020 1222 Gross per 24 hour  Intake 1326.57 ml  Output 2550 ml  Net -1223.43 ml   Filed Weights   06/24/20 1045 06/25/20 1046 06/27/20 0409  Weight: 90.7 kg 94.3 kg 96 kg    Examination: Tmax 98.7  (no spikes this admit) 02 sats  95  On   10 lpm  General: more chronically than acutely ill appearing HENT: nl Lungs: diffuse dry insp crackles  Cardiovascular: RRR  2-3/6 sem Abdomen: mod distended, soft Extremities: warm, trace pitting hands > legs Neuro: slow mentation, poor recall of recent events     I personally reviewed images and agree with radiology impression as follows:  CXR:   Portable 12/2 1. Stable cardiomegaly. 2. Progressive diffuse severe bilateral pulmonary infiltrates/edema.  Resolved Hospital Problem list      Assessment & Plan:  1) acute hypoxemic resp failure with diffuse pulmnary infiltrates ? Etiology   [facility]  Miscellaneous:Alv microlithiasis, alv proteinosis, asp, bronchiectais, BOOP  > check esr  ARDS/ AIP Occupational dz/ HSP Neoplasm Infection  Pct does not support CAP but hard to exclude - check legionella ag/ d/c doxy for now   Drug  - amiodarone top of the list of suspects > no choice but stop it in this setting as very difficult to exclude Pulmonary emboli, Protein disorders Edema>  bnp up/ attempting to diures but note last LA  on Echo PTA was nl so doubt this is simple diastolic dysfunction / chf  Eosinophilic dz- no Eos on peripheral smear Sarcoidosis Connective tissue dz Hist X  Hemorrhage  - no hemoptysis reported and anemia may be explained by gib Idiopathic    For now rec keep as dry as possible and hold amiodarone, consider 48 h solumedrol if esr way  up but with risk of exacerbating dm will hold off steroids for now.     2) Diastolic dysfunction with AS see echo above, but note LA nl size just 6 week PTA >>>keep as dry as tol  3)CRI  Lab Results  Component Value Date   CREATININE 1.57 (H) 06/26/2020   CREATININE 2.20 (H) 06/25/2020   CREATININE 2.44 (H) 06/24/2020   avoid toxins, keep euvolemic - may need oracit to improve buffering   Labs   CBC: Recent Labs  Lab 06/24/20 1402 06/25/20 0208 06/26/20 0400 06/26/20 2235  WBC 26.2* 26.3* 18.9*  --   NEUTROABS 20.2*  --   --   --   HGB 7.5* 7.0* 7.0* 9.5*  HCT  25.1* 22.8* 23.5* 30.3*  MCV 121.3* 121.3* 118.7*  --   PLT 239 208 165  --     Basic Metabolic Panel: Recent Labs  Lab 06/24/20 1402 06/24/20 2319 06/25/20 0208 06/26/20 0400  NA 135 137 138 141  K 4.1 4.1 3.7 2.7*  CL 99 104 106 110  CO2 20* 19* 19* 19*  GLUCOSE 405* 317* 297* 159*  BUN 46* 46* 45* 28*  CREATININE 2.51* 2.44* 2.20* 1.57*  CALCIUM 8.6* 8.1* 8.0* 6.7*  MG  --  2.6*  --  2.0   GFR: Estimated Creatinine Clearance: 48 mL/min (A) (by C-G formula based on SCr of 1.57 mg/dL (H)). Recent Labs  Lab 06/24/20 1402 06/24/20 2017 06/24/20 2319 06/25/20 0208 06/26/20 0400  PROCALCITON  --   --   --  0.48  --   WBC 26.2*  --   --  26.3* 18.9*  LATICACIDVEN 3.4* 1.7 1.6  --   --     Liver Function Tests: Recent Labs  Lab 06/24/20 1402 06/25/20 0208 06/26/20 0400  AST 147* 61* 39  ALT 130* 96* 62*  ALKPHOS 146* 120 104  BILITOT 2.1* 1.8* 1.3*  PROT 7.4 6.6 5.4*  ALBUMIN 2.9* 2.6* 1.9*   No results for input(s): LIPASE, AMYLASE in the last 168  hours. No results for input(s): AMMONIA in the last 168 hours.  ABG    Component Value Date/Time   PHART 7.484 (H) 06/24/2020 1224   PCO2ART 28.6 (L) 06/24/2020 1224   PO2ART 94.7 06/24/2020 1224   HCO3 23.2 06/24/2020 1224   TCO2 21 (L) 05/23/2019 1021   ACIDBASEDEF 1.7 06/24/2020 1224   O2SAT 98.0 06/24/2020 1224     Coagulation Profile: Recent Labs  Lab 06/24/20 1402  INR 2.7*    Cardiac Enzymes: No results for input(s): CKTOTAL, CKMB, CKMBINDEX, TROPONINI in the last 168 hours.  HbA1C: Hemoglobin A1C  Date/Time Value Ref Range Status  04/30/2020 11:45 AM 6.2 (A) 4.0 - 5.6 % Final   Hgb A1c MFr Bld  Date/Time Value Ref Range Status  06/25/2020 02:08 AM 8.3 (H) 4.8 - 5.6 % Final    Comment:    (NOTE) Pre diabetes:          5.7%-6.4%  Diabetes:              >6.4%  Glycemic control for   <7.0% adults with diabetes   11/27/2019 10:43 AM 7.1 (H) 4.8 - 5.6 % Final    Comment:             Prediabetes: 5.7 - 6.4          Diabetes: >6.4          Glycemic control for adults with diabetes: <7.0     CBG: Recent Labs  Lab 06/26/20 1938 06/26/20 2314 06/27/20 0408 06/27/20 0724 06/27/20 1140  GLUCAP 233* 164* 180* 157* 70*       Past Medical History  He,  has a past medical history of Arthritis, Asthma, Atrial fibrillation ([place]), CAD (coronary artery disease) (06/29/2019), Cramps of left lower extremity, Depression, Diabetes mellitus, Dyspnea, Elevated liver enzymes, Fatty liver, History of kidney stones, Hyperlipidemia, Hypertension, Renal insufficiency, and Vertigo.   Surgical History    Past Surgical History:  Procedure Laterality Date  . CARDIOVERSION N/A 04/21/2019   Procedure: CARDIOVERSION;  Surgeon: Sanda Klein, MD;  Location: [facility];  Service: Cardiovascular;  Laterality: N/A;  . CARPAL TUNNEL RELEASE     both wrist  .  cataract surgery     bilateral  . CHOLECYSTECTOMY    . COLONOSCOPY  2011  . EYE SURGERY    . HEMORROIDECTOMY     . KNEE SURGERY Left   . LEFT HEART CATH AND CORONARY ANGIOGRAPHY N/A 05/23/2019   Procedure: LEFT HEART CATH AND CORONARY ANGIOGRAPHY;  Surgeon: Belva Crome, MD;  Location: [place] [facility];  Service: Cardiovascular;  Laterality: N/A;  . LUMBAR LAMINECTOMY/DECOMPRESSION MICRODISCECTOMY N/A 08/06/2016   Procedure: LUMBAR THREE- LUMBAR FIVE  DECOMPRESSIVE LUMBAR LAMINECTOMY;  Surgeon: Jovita Gamma, MD;  Location: [place];  Service: Neurosurgery;  Laterality: N/A;     Social History   reports that he quit smoking about 37 years ago. His smoking use included cigarettes. He has a 80.00 pack-year smoking history. He has never used smokeless tobacco. He reports that he does not drink alcohol and does not use drugs.   Family History   His family history includes COPD in his brother, sister, and sister; Cancer in his mother; Diabetes in his brother, maternal grandmother, mother, and sister; Healthy in his daughter and daughter; Liver disease in his brother. There is no history of Colon cancer.   Allergies Allergies  Allergen Reactions  . Demerol Nausea And Vomiting  . Vasotec [Enalapril] Cough  . Lasix [Furosemide] Rash     Home Medications  Prior to Admission medications   Medication Sig Start Date End Date Taking? Authorizing Provider  acetaminophen (TYLENOL) 650 MG CR tablet Take 650 mg by mouth every 8 (eight) hours as needed for pain.   Yes [provider]  amiodarone (PACERONE) 200 MG tablet TAKE 1 TABLET BY MOUTH EVERY DAY 12/05/19  Yes Herminio Commons, MD  apixaban (ELIQUIS) 5 MG TABS tablet Take 1 tablet (5 mg total) by mouth 2 (two) times daily. 10/03/19  Yes Herminio Commons, MD  atorvastatin (LIPITOR) 20 MG tablet Take 1 tablet (20 mg total) by mouth daily. 10/03/19 09/27/20 Yes Herminio Commons, MD  diltiazem (CARDIZEM CD) 360 MG 24 hr capsule Take 1 capsule (360 mg total) by mouth daily. 04/22/20  Yes Strader, [place], PA-C  empagliflozin (JARDIANCE) 10 MG  TABS tablet Take 1 tablet (10 mg total) by mouth daily before breakfast. 04/23/20  Yes Lovena Le, Malena M, DO  glipiZIDE (GLUCOTROL) 5 MG tablet Take 2 tablets (10 mg total) by mouth 2 (two) times daily. 04/22/20  Yes Lovena Le, Malena M, DO  ibuprofen (ADVIL) 200 MG tablet Take 200 mg by mouth every 6 (six) hours as needed.   Yes [provider]  metoprolol tartrate (LOPRESSOR) 25 MG tablet Take 1 tablet (25 mg total) by mouth 2 (two) times daily. 04/30/20  Yes Lovena Le, Malena M, DO  torsemide (DEMADEX) 20 MG tablet Take 1 tablet (20 mg total) by mouth daily. 05/31/20 05/26/21 Yes Verta Ellen., NP  traMADol (ULTRAM) 50 MG tablet Take 1 tablet (50 mg total) by mouth every 12 (twelve) hours as needed for severe pain. 06/18/20 06/18/21 Yes Lovena Le, Malena M, DO  Vitamin D, Ergocalciferol, (DRISDOL) 1.25 MG (50000 UNIT) CAPS capsule Take 1 capsule (50,000 Units total) by mouth every 7 (seven) days. 06/07/20  Yes Barton Dubois, MD  allopurinol (ZYLOPRIM) 100 MG tablet Take 1 tablet (100 mg total) by mouth daily. For gout 06/18/20   Erven Colla, DO      The patient is critically ill with multiple organ systems failure and requires high complexity decision making for assessment and support, frequent evaluation and titration  of therapies, application of advanced monitoring technologies and extensive interpretation of multiple databases. Critical Care Time devoted to patient care services described in this note is 45 minutes.    Christinia Gully, MD Pulmonary and [place] [phone]   After 7:00 pm call Elink  [phone]

## 2020-06-27 NOTE — Progress Notes (Signed)
Subjective:  Still struggling with breathing. Getting up from bed to chair, became SOB. Nursing reports that his oxygen dropped to 86% with getting out of bed. No melena, brbpr. Last BM yesterday. No n/v. Tolerating clear liquids.   Objective: Vital signs in last 24 hours: Temp:  [97.6 F (36.4 C)-98.3 F (36.8 C)] 98 F (36.7 C) (12/02 0700) Pulse Rate:  [69-78] 71 (12/02 0932) Resp:  [19-35] 19 (12/02 0932) BP: (128-163)/(59-81) 136/59 (12/02 0400) SpO2:  [86 %-98 %] 93 % (12/02 0932) Weight:  [96 kg] 96 kg (12/02 0409) Last BM Date: 06/26/20 General:   Alert,  Well-developed, well-nourished, pleasant and cooperative in NAD Head:  Normocephalic and atraumatic. Eyes:  Sclera clear, no icterus.  Chest: CTA bilaterally without rales, rhonchi, crackles.    Heart:  Regular rate and rhythm; no murmurs, clicks, rubs,  or gallops. Abdomen:  Soft, nontender and nondistended.  Normal bowel sounds, without guarding, and without rebound.   Extremities:  Without clubbing, deformity. 1+ pitting edema. Neurologic:  Alert and  oriented x4;  grossly normal neurologically. Skin:  Intact without significant lesions or rashes. Psych:  Alert and cooperative. Normal mood and affect.  Intake/Output from previous day: 12/01 0701 - 12/02 0700 In: 1566.6 [P.O.:240; Blood:318; IV Piggyback:1008.6] Out: 1800 [Urine:1800] Intake/Output this shift: No intake/output data recorded.  Lab Results: CBC Recent Labs    06/24/20 1402 06/24/20 1402 06/25/20 0208 06/26/20 0400 06/26/20 2235  WBC 26.2*  --  26.3* 18.9*  --   HGB 7.5*   < > 7.0* 7.0* 9.5*  HCT 25.1*   < > 22.8* 23.5* 30.3*  MCV 121.3*  --  121.3* 118.7*  --   PLT 239  --  208 165  --    < > = values in this interval not displayed.   BMET Recent Labs    06/24/20 2319 06/25/20 0208 06/26/20 0400  NA 137 138 141  K 4.1 3.7 2.7*  CL 104 106 110  CO2 19* 19* 19*  GLUCOSE 317* 297* 159*  BUN 46* 45* 28*  CREATININE 2.44* 2.20* 1.57*   CALCIUM 8.1* 8.0* 6.7*   LFTs Recent Labs    06/24/20 1402 06/25/20 0208 06/26/20 0400  BILITOT 2.1* 1.8* 1.3*  ALKPHOS 146* 120 104  AST 147* 61* 39  ALT 130* 96* 62*  PROT 7.4 6.6 5.4*  ALBUMIN 2.9* 2.6* 1.9*   No results for input(s): LIPASE in the last 72 hours. PT/INR Recent Labs    06/24/20 1402  LABPROT 27.4*  INR 2.7*      Imaging Studies: DG Elbow Complete Left  Result Date: 06/06/2020 CLINICAL DATA:  Pain following fall EXAM: LEFT ELBOW - COMPLETE 3+ VIEW COMPARISON:  None. FINDINGS: Frontal, lateral, and bilateral oblique views were obtained. No fracture, dislocation, or joint effusion. There is spurring along the medial distal humeral condyle. No appreciable joint space narrowing or erosion. IMPRESSION: Spurring along the medial distal humeral condyle. No appreciable joint space narrowing. No fracture or dislocation. Electronically Signed   By: Lowella Grip III M.D.   On: 06/06/2020 12:18   US RENAL  Result Date: 06/17/2020 CLINICAL DATA:  Chronic renal disease stage IV EXAM: RENAL / URINARY TRACT ULTRASOUND COMPLETE COMPARISON:  April 21, 2019 FINDINGS: Right Kidney: Renal measurements: 10.7 cm x 5.2 cm x 5.5 cm = volume: 162.9 mL. Echogenicity within normal limits. A 3.1 cm x 3.3 cm x 2.7 cm complex anechoic structure is seen within the mid right kidney. No hydronephrosis is  visualized. Left Kidney: Renal measurements: 12.3 cm x 7.2 cm x 6.1 cm = volume: 283.3 mL. Echogenicity within normal limits. 2.1 cm x 2.1 cm x 2.3 cm and 3.0 cm x 2.1 cm x 2.4 cm anechoic structures are seen within the mid and lower left kidney. An 8.5 mm shadowing echogenic focus is also seen within the lower pole of the left kidney. No hydronephrosis is visualized. Bladder: Appears normal for degree of bladder distention. Bilateral ureteral jets are visualized. Other: None. IMPRESSION: 1. Bilateral renal cysts, mildly increased in size when compared to the prior study. 2. Subcentimeter  non-obstructing renal stone within the lower pole of the left kidney. Electronically Signed   By: Virgina Norfolk M.D.   On: 06/17/2020 23:57   DG CHEST PORT 1 VIEW  Result Date: 06/27/2020 CLINICAL DATA:  Shortness of breath. EXAM: PORTABLE CHEST 1 VIEW COMPARISON:  06/24/2020.  CT abdomen 06/19/2014. FINDINGS: Stable cardiomegaly. Progressive diffuse severe bilateral pulmonary infiltrates/edema. No prominent pleural effusion. No pneumothorax. Stable calcific densities noted over the right upper quadrant, most likely dystrophic calcification. Degenerative change thoracic spine and both shoulders. IMPRESSION: 1. Stable cardiomegaly. 2. Progressive diffuse severe bilateral pulmonary infiltrates/edema. Electronically Signed   By: Marcello Moores  Register   On: 06/27/2020 05:56   DG Chest Port 1 View  Result Date: 06/24/2020 CLINICAL DATA:  Shortness of breath. EXAM: PORTABLE CHEST 1 VIEW COMPARISON:  Dec 12, 2019. FINDINGS: Stable cardiomediastinal silhouette. No pneumothorax or pleural effusion is noted. Bilateral patchy airspace opacities are now noted concerning for multifocal pneumonia. Bony thorax is unremarkable IMPRESSION: Bilateral patchy airspace opacities are noted concerning for multifocal pneumonia. Electronically Signed   By: Marijo Conception M.D.   On: 06/24/2020 11:37   DG Hip Unilat W or Wo Pelvis 2-3 Views Left  Result Date: 06/06/2020 CLINICAL DATA:  Left hip pain since a fall 06/03/2020. Initial encounter. EXAM: DG HIP (WITH OR WITHOUT PELVIS) 2-3V LEFT COMPARISON:  Plain films left hip 03/23/2020. FINDINGS: There is no acute bony or joint abnormality. Hip joint spaces are preserved. Scattered enthesopathic change noted. IMPRESSION: No acute abnormality. Electronically Signed   By: Inge Rise M.D.   On: 06/06/2020 12:18   US Abdomen Limited RUQ (LIVER/GB)  Result Date: 06/25/2020 CLINICAL DATA:  Elevated LFTs, post cholecystectomy EXAM: ULTRASOUND ABDOMEN LIMITED RIGHT UPPER  QUADRANT COMPARISON:  September of 2018 CT evaluation FINDINGS: Gallbladder: Post cholecystectomy. Common bile duct: Diameter: 4.2 mm Liver: Mildly increased echogenicity of hepatic parenchyma without visible lesion on limited images. Portal vein is patent on color Doppler imaging with normal direction of blood flow towards the liver. Other: RIGHT pleural effusion, may be moderate. IMPRESSION: 1. Hepatic steatosis, mild. 2. No biliary duct dilation post cholecystectomy. 3. RIGHT-sided pleural effusion partially imaged. Electronically Signed   By: Zetta Bills M.D.   On: 06/25/2020 15:07  [2 weeks]   Assessment: 74 year old male with history of paroxysmal atrial fibrillation on apixaban, kidney disease stage III, hypertension, diastolic CHF, hyperlipidemia, diabetes, macrocytic anemia following with hematology status post iron infusion and receiving Retacrit, admitted November 29 with sepsis and acute respiratory failure in the setting of pneumonia and pulmonary edema with acute on chronic diastolic CHF.  GI was consulted due to intermittent rectal bleeding and acute on chronic anemia.  Anemia: In the setting of anticoagulation.  Reported intermittent rectal bleeding.  No further gross GI bleeding noted this admission.  Iron studies not consistent with IDA, likely multifactorial.  Last colonoscopy October 2011, several polyps removed, couple  were tubular adenomas.  Patient declined repeat colonoscopy in 2016.  Has received 2 units of packed red blood cells. Currently not a candidate for endoscopic evaluation from a respiratory standpoint.  Elevated LFTs: Intermittent dating back to 2012 in the setting of fatty liver.  Likely elevated in the setting of acute illness, currently improving.  Viral markers negative.   Right upper quadrant ultrasound with fatty liver. LFTs improving.  Acute respiratory failure with hypoxia: Secondary to pneumonia and pulmonary edema.  Currently on 15 L of high flow nasal  cannula.  BNP 625.  Chest x-ray today with progressive diffuse severe bilateral pulmonary infiltrates/edema.   Plan: 1. Follow H&H, keep hemoglobin above 9. 2. Holding on endoscopic evaluation until improvement from respiratory standpoint.  Continue clear liquid diet. 3. Follow-up palliative care consult.  Laureen Ochs. Bernarda Caffey [facility] [phone] 12/2/20212:06 PM    LOS: 3 days

## 2020-06-27 NOTE — Progress Notes (Signed)
PROGRESS NOTE  Jack Mejia ZWC:[id number] DOB: July 18, 1969 DOA: 06/24/2020 PCP: Erven Colla, DO  Brief History:  74 y.o.year old malewith a history of paroxysmal atrial fibrillation on apixaban, CKD stage III, hypertension, diastolic CHF, hyperlipidemia, diabetes mellitus type 2,macrocytic anemia following with hematology s/p iron injection and receiving Retacrit, and adenomatous colon polyps in 2011who was admitted 06/24/20 with sepsis and acuterespiratory failurein the setting of pneumonia and pulmonary edema withacute on chronic diastolic CHF.  Patient was also found to have intermittent rectal bleeding reported and acute on chronic anemia-   Assessment/Plan: Acute respiratory failure with hypoxia -Secondary to pneumonia and pulmonary edema, suspect component of amiodarone induced pneumonitis--sed rate up to 130 -Discussed with pulmonologist Dr. Melvyn Novas -Stop amiodarone -requiring up to 15 L of oxygen able to wean down to 10 L -Wean oxygen as tolerated for saturation greater 92%  - Acute on chronic diastolic CHF -The patient is clinically fluid overloaded -Volume overload status-worse with transfusion of PRBC -Apparently patient missed his a.m. Bumex dose --Continue diuresis with IV Bumex 2 mg every 12 hours, apparently patient is intolerant to Lasix -He has been able to tolerate torsemide at home -Daily weights -Accurate I's and O's -05/08/2020 echo EF 65 to 70%, no WMA, grade 1 DD, trivial MR, moderate AAS -BNP 883>>>625 (previous baseline around 300)  DM2- last A1c 8.3 reflecting uncontrolled DM with hyperglycemia -NovoLog sliding scale -Holding glipizide  CAP/Lobar pneumonia -COVID-19 PCR negative -Continue ceftriaxone and doxycycline -Continue bronchodilators and mucolytics  Severe sepsis -Present on admission -Presented with leukocytosis and tachypnea -Secondary to pneumonia as above -Lactic acid peaked 3.4 -Continue  antibiotics as  above  Acute on chronic renal failure CKD stage IIIb -Baseline creatinine 2.0-2.2 -Presented with serum creatinine 2.51 -Monitor closely with diuresis AKI----acute kidney injury on CKD stage - 3B--due to sepsis and hypoperfusion -  creatinine on admission=2.51  , baseline creatinine =2.0     , creatinine is now=1.57  ----renally adjust medications, avoid nephrotoxic agents / dehydration  / hypotension   Hematochezia/ABLA -GI consult -baseline Hgb 9-10 -presented with Hgb 7.5 -Received 1 unit of PRBC, hemoglobin down to 7.0, -Additional unit of PRBC transfused on 06/26/2020 -GI consult appreciated  Paroxysmal atrial fibrillation -holding apixaban due to hematochezia -Continue Cardizem CD Stop amiodarone due to concerns about possible amiodarone induced pneumonitis  Essential hypertension -Anticipate improvement with diuresis -Continue diltiazem CD -Continue metoprolol heart rate  Hyperlipidemia -continue statin  Hypokalemia/Hypomagnesemia--- replace and recheck, electrolytes abnormalities may persist due to high-dose diuretic use  Social/ethics--plan of care, advanced directives and goals of care discussed with patient and patient's 2 daughters [name] and [name] -At this time patient is a partial code specifically no DNR but okay for CPR, BiPAP and ACLS drugs  Status is: Inpatient  Remains inpatient appropriate because:Requiring IV Bumex, very very high flow nasal cannula at 15 L/min   Dispo: The patient is from: Home              Anticipated d/c is to: Home              Anticipated d/c date is: 2 days              Patient currently is not medically stable to d/c.   Family Communication: -Discussed with S/o at [phone] Discussed with daughter  Consultants:  none  Code Status:  FULL  DVT Prophylaxis: SCDs  Procedures: As Listed in Progress Note Above  Antibiotics: None  Subjective: shob persist,   Significant dyspnea on exertion with positional  change and minimal activity -Able to get out of bed to chair with difficulty and dyspnea and hypoxia--requiring up to 15 L of oxygen able to wean down to 10 L No fever  Or chills   No Nausea, Vomiting or Diarrhea -Patient's daughters [name] and [name] at bedside, questions answered  Objective: Vitals:   06/27/20 1038 06/27/20 1100 06/27/20 1143 06/27/20 1200  BP: 140/69 (!) 141/63  134/68  Pulse: 67 66  68  Resp: (!) 31 11  19   Temp:   98.7 F (37.1 C)   TempSrc:   Axillary   SpO2: 96% 98%  99%  Weight:      Height:        Intake/Output Summary (Last 24 hours) at 06/27/2020 1810 Last data filed at 06/27/2020 1222 Gross per 24 hour  Intake 1008.57 ml  Output 1750 ml  Net -741.43 ml   Weight change: 1.7 kg Exam:   General:  Pt is alert, follows commands appropriately, not in acute distress  HEENT: No icterus, No thrush, No neck mass, [place]/AT  Nose-- HFNC 10L/min  Cardiovascular: RRR, S1/S2, no rubs, no gallops  Respiratory:  Diminished breath sounds , scattered Rhonchi  Abdomen: Soft/+BS, non tender, non distended, no guarding  Extremities: Good pedal pulses,   NeuroPsych--- generalized weakness without new focal deficits, affect is appropriate  Data Reviewed: I have personally reviewed following labs and imaging studies  Basic Metabolic Panel: Recent Labs  Lab 06/24/20 1402 06/24/20 2319 06/25/20 0208 06/26/20 0400  NA 135 137 138 141  K 4.1 4.1 3.7 2.7*  CL 99 104 106 110  CO2 20* 19* 19* 19*  GLUCOSE 405* 317* 297* 159*  BUN 46* 46* 45* 28*  CREATININE 2.51* 2.44* 2.20* 1.57*  CALCIUM 8.6* 8.1* 8.0* 6.7*  MG  --  2.6*  --  2.0   Liver Function Tests: Recent Labs  Lab 06/24/20 1402 06/25/20 0208 06/26/20 0400  AST 147* 61* 39  ALT 130* 96* 62*  ALKPHOS 146* 120 104  BILITOT 2.1* 1.8* 1.3*  PROT 7.4 6.6 5.4*  ALBUMIN 2.9* 2.6* 1.9*   No results for input(s): LIPASE, AMYLASE in the last 168 hours. No results for input(s): AMMONIA in the  last 168 hours. Coagulation Profile: Recent Labs  Lab 06/24/20 1402  INR 2.7*   CBC: Recent Labs  Lab 06/24/20 1402 06/25/20 0208 06/26/20 0400 06/26/20 2235  WBC 26.2* 26.3* 18.9*  --   NEUTROABS 20.2*  --   --   --   HGB 7.5* 7.0* 7.0* 9.5*  HCT 25.1* 22.8* 23.5* 30.3*  MCV 121.3* 121.3* 118.7*  --   PLT 239 208 165  --    Cardiac Enzymes: No results for input(s): CKTOTAL, CKMB, CKMBINDEX, TROPONINI in the last 168 hours. BNP: Invalid input(s): POCBNP CBG: Recent Labs  Lab 06/26/20 2314 06/27/20 0408 06/27/20 0724 06/27/20 1140 06/27/20 1659  GLUCAP 164* 180* 157* 226* 254*   HbA1C: Recent Labs    06/25/20 0208  HGBA1C 8.3*   Urine analysis:    Component Value Date/Time   COLORURINE COLORLESS (A) 06/06/2020 1151   APPEARANCEUR CLEAR 06/06/2020 1151   LABSPEC 1.013 06/06/2020 1151   PHURINE 5.0 06/06/2020 1151   GLUCOSEU >=500 (A) 06/06/2020 1151   HGBUR MODERATE (A) 06/06/2020 1151   BILIRUBINUR NEGATIVE 06/06/2020 [place] 06/06/2020 1151   PROTEINUR NEGATIVE 06/06/2020 1151   NITRITE NEGATIVE 06/06/2020 1151  LEUKOCYTESUR NEGATIVE 06/06/2020 1151   Sepsis Labs: @LABRCNTIP (procalcitonin:4,lacticidven:4) ) Recent Results (from the past 240 hour(s))  Resp Panel by RT-PCR (Flu A&B, Covid) Nasopharyngeal Swab     Status: None   Collection Time: 06/24/20 11:43 AM   Specimen: Nasopharyngeal Swab; Nasopharyngeal(NP) swabs in vial transport medium  Result Value Ref Range Status   SARS Coronavirus 2 by RT PCR NEGATIVE NEGATIVE Final    Comment: (NOTE) SARS-CoV-2 target nucleic acids are NOT DETECTED.  The SARS-CoV-2 RNA is generally detectable in upper respiratory specimens during the acute phase of infection. The lowest concentration of SARS-CoV-2 viral copies this assay can detect is 138 copies/mL. A negative result does not preclude SARS-Cov-2 infection and should not be used as the sole basis for treatment or other patient  management decisions. A negative result may occur with  improper specimen collection/handling, submission of specimen other than nasopharyngeal swab, presence of viral mutation(s) within the areas targeted by this assay, and inadequate number of viral copies(<138 copies/mL). A negative result must be combined with clinical observations, patient history, and epidemiological information. The expected result is Negative.  Fact Sheet for Patients:  EntrepreneurPulse.com.au  Fact Sheet for Healthcare Providers:  IncredibleEmployment.be  This test is no t yet approved or cleared by the [place] FDA and  has been authorized for detection and/or diagnosis of SARS-CoV-2 by FDA under an Emergency Use Authorization (EUA). This EUA will remain  in effect (meaning this test can be used) for the duration of the COVID-19 declaration under Section 564(b)(1) of the Act, 21 U.S.C.section 360bbb-3(b)(1), unless the authorization is terminated  or revoked sooner.       Influenza A by PCR NEGATIVE NEGATIVE Final   Influenza B by PCR NEGATIVE NEGATIVE Final    Comment: (NOTE) The Xpert Xpress SARS-CoV-2/FLU/RSV plus assay is intended as an aid in the diagnosis of influenza from Nasopharyngeal swab specimens and should not be used as a sole basis for treatment. Nasal washings and aspirates are unacceptable for Xpert Xpress SARS-CoV-2/FLU/RSV testing.  Fact Sheet for Patients: EntrepreneurPulse.com.au  Fact Sheet for Healthcare Providers: IncredibleEmployment.be  This test is not yet approved or cleared by the [place] FDA and has been authorized for detection and/or diagnosis of SARS-CoV-2 by FDA under an Emergency Use Authorization (EUA). This EUA will remain in effect (meaning this test can be used) for the duration of the COVID-19 declaration under Section 564(b)(1) of the Act, 21 U.S.C. section 360bbb-3(b)(1),  unless the authorization is terminated or revoked.  Performed at [facility], [address], [place], [place] [address]   Blood culture (routine x 2)     Status: None (Preliminary result)   Collection Time: 06/24/20  2:04 PM   Specimen: Left Antecubital; Blood  Result Value Ref Range Status   Specimen Description   Final    LEFT ANTECUBITAL BOTTLES DRAWN AEROBIC AND ANAEROBIC   Special Requests Blood Culture adequate volume  Final   Culture   Final    NO GROWTH 3 DAYS Performed at [facility], [address] [place], [place] [address]    Report Status PENDING  Incomplete  Blood culture (routine x 2)     Status: None (Preliminary result)   Collection Time: 06/24/20  2:04 PM   Specimen: BLOOD LEFT HAND  Result Value Ref Range Status   Specimen Description   Final    BLOOD LEFT HAND BOTTLES DRAWN AEROBIC AND ANAEROBIC   Special Requests Blood Culture adequate volume  Final   Culture  Final    NO GROWTH 3 DAYS Performed at [facility], [address] [place], [place] [address]    Report Status PENDING  Incomplete  MRSA PCR Screening     Status: None   Collection Time: 06/25/20  2:36 PM   Specimen: Nasopharyngeal  Result Value Ref Range Status   MRSA by PCR NEGATIVE NEGATIVE Final    Comment:        The GeneXpert MRSA Assay (FDA approved for NASAL specimens only), is one component of a comprehensive MRSA colonization surveillance program. It is not intended to diagnose MRSA infection nor to guide or monitor treatment for MRSA infections. Performed at [facility], [address] [place], [place] [address]      Scheduled Meds: . sodium chloride   Intravenous Once  . atorvastatin  20 mg Oral Daily  . bumetanide (BUMEX) IV  2 mg Intravenous BID  . Chlorhexidine Gluconate Cloth  6 each Topical Daily  . diltiazem  360 mg Oral Daily  . hydrocortisone   Rectal BID  . insulin aspart  0-15 Units Subcutaneous Q4H  . insulin detemir  10 Units Subcutaneous Daily  . mouth  rinse  15 mL Mouth Rinse BID  . metoprolol tartrate  25 mg Oral BID   Continuous Infusions: . cefTRIAXone (ROCEPHIN)  IV Stopped (06/27/20 0410)    Procedures/Studies: DG Elbow Complete Left  Result Date: 06/06/2020 CLINICAL DATA:  Pain following fall EXAM: LEFT ELBOW - COMPLETE 3+ VIEW COMPARISON:  None. FINDINGS: Frontal, lateral, and bilateral oblique views were obtained. No fracture, dislocation, or joint effusion. There is spurring along the medial distal humeral condyle. No appreciable joint space narrowing or erosion. IMPRESSION: Spurring along the medial distal humeral condyle. No appreciable joint space narrowing. No fracture or dislocation. Electronically Signed   By: Lowella Grip III M.D.   On: 06/06/2020 12:18   US RENAL  Result Date: 06/17/2020 CLINICAL DATA:  Chronic renal disease stage IV EXAM: RENAL / URINARY TRACT ULTRASOUND COMPLETE COMPARISON:  April 21, 2019 FINDINGS: Right Kidney: Renal measurements: 10.7 cm x 5.2 cm x 5.5 cm = volume: 162.9 mL. Echogenicity within normal limits. A 3.1 cm x 3.3 cm x 2.7 cm complex anechoic structure is seen within the mid right kidney. No hydronephrosis is visualized. Left Kidney: Renal measurements: 12.3 cm x 7.2 cm x 6.1 cm = volume: 283.3 mL. Echogenicity within normal limits. 2.1 cm x 2.1 cm x 2.3 cm and 3.0 cm x 2.1 cm x 2.4 cm anechoic structures are seen within the mid and lower left kidney. An 8.5 mm shadowing echogenic focus is also seen within the lower pole of the left kidney. No hydronephrosis is visualized. Bladder: Appears normal for degree of bladder distention. Bilateral ureteral jets are visualized. Other: None. IMPRESSION: 1. Bilateral renal cysts, mildly increased in size when compared to the prior study. 2. Subcentimeter non-obstructing renal stone within the lower pole of the left kidney. Electronically Signed   By: Virgina Norfolk M.D.   On: 06/17/2020 23:57   DG CHEST PORT 1 VIEW  Result Date:  06/27/2020 CLINICAL DATA:  Shortness of breath. EXAM: PORTABLE CHEST 1 VIEW COMPARISON:  06/24/2020.  CT abdomen 06/19/2014. FINDINGS: Stable cardiomegaly. Progressive diffuse severe bilateral pulmonary infiltrates/edema. No prominent pleural effusion. No pneumothorax. Stable calcific densities noted over the right upper quadrant, most likely dystrophic calcification. Degenerative change thoracic spine and both shoulders. IMPRESSION: 1. Stable cardiomegaly. 2. Progressive diffuse severe bilateral pulmonary infiltrates/edema. Electronically Signed   By: Marcello Moores  Register   On: 06/27/2020 05:56   DG Chest Port 1 View  Result Date: 06/24/2020 CLINICAL DATA:  Shortness of breath. EXAM: PORTABLE CHEST 1 VIEW COMPARISON:  Dec 12, 2019. FINDINGS: Stable cardiomediastinal silhouette. No pneumothorax or pleural effusion is noted. Bilateral patchy airspace opacities are now noted concerning for multifocal pneumonia. Bony thorax is unremarkable IMPRESSION: Bilateral patchy airspace opacities are noted concerning for multifocal pneumonia. Electronically Signed   By: Marijo Conception M.D.   On: 06/24/2020 11:37   DG Hip Unilat W or Wo Pelvis 2-3 Views Left  Result Date: 06/06/2020 CLINICAL DATA:  Left hip pain since a fall 06/03/2020. Initial encounter. EXAM: DG HIP (WITH OR WITHOUT PELVIS) 2-3V LEFT COMPARISON:  Plain films left hip 03/23/2020. FINDINGS: There is no acute bony or joint abnormality. Hip joint spaces are preserved. Scattered enthesopathic change noted. IMPRESSION: No acute abnormality. Electronically Signed   By: Inge Rise M.D.   On: 06/06/2020 12:18   US Abdomen Limited RUQ (LIVER/GB)  Result Date: 06/25/2020 CLINICAL DATA:  Elevated LFTs, post cholecystectomy EXAM: ULTRASOUND ABDOMEN LIMITED RIGHT UPPER QUADRANT COMPARISON:  September of 2018 CT evaluation FINDINGS: Gallbladder: Post cholecystectomy. Common bile duct: Diameter: 4.2 mm Liver: Mildly increased echogenicity of hepatic  parenchyma without visible lesion on limited images. Portal vein is patent on color Doppler imaging with normal direction of blood flow towards the liver. Other: RIGHT pleural effusion, may be moderate. IMPRESSION: 1. Hepatic steatosis, mild. 2. No biliary duct dilation post cholecystectomy. 3. RIGHT-sided pleural effusion partially imaged. Electronically Signed   By: Zetta Bills M.D.   On: 06/25/2020 15:07    Jearld Hemp Denton Brick, MD  [facility]  If 7PM-7AM, please contact night-coverage [email or web address] Password [email or web address] 06/27/2020, 6:10 PM   LOS: 3 days

## 2020-06-28 DIAGNOSIS — E1121 Type 2 diabetes mellitus with diabetic nephropathy: Secondary | ICD-10-CM

## 2020-06-28 DIAGNOSIS — J9601 Acute respiratory failure with hypoxia: Secondary | ICD-10-CM | POA: Diagnosis not present

## 2020-06-28 DIAGNOSIS — I5033 Acute on chronic diastolic (congestive) heart failure: Secondary | ICD-10-CM

## 2020-06-28 DIAGNOSIS — R0600 Dyspnea, unspecified: Secondary | ICD-10-CM

## 2020-06-28 DIAGNOSIS — R0689 Other abnormalities of breathing: Secondary | ICD-10-CM

## 2020-06-28 DIAGNOSIS — J189 Pneumonia, unspecified organism: Secondary | ICD-10-CM | POA: Diagnosis not present

## 2020-06-28 DIAGNOSIS — D62 Acute posthemorrhagic anemia: Secondary | ICD-10-CM

## 2020-06-28 LAB — BASIC METABOLIC PANEL
Anion gap: 11 (ref 5–15)
BUN: 26 mg/dL — ABNORMAL HIGH (ref 8–23)
CO2: 19 mmol/L — ABNORMAL LOW (ref 22–32)
Calcium: 7.6 mg/dL — ABNORMAL LOW (ref 8.9–10.3)
Chloride: 104 mmol/L (ref 98–111)
Creatinine, Ser: 1.68 mg/dL — ABNORMAL HIGH (ref 0.61–1.24)
GFR, Estimated: 42 mL/min — ABNORMAL LOW (ref >60)
Glucose, Bld: 149 mg/dL — ABNORMAL HIGH (ref 70–99)
Potassium: 2.9 mmol/L — ABNORMAL LOW (ref 3.5–5.1)
Sodium: 134 mmol/L — ABNORMAL LOW (ref 135–145)

## 2020-06-28 LAB — CBC
HCT: 28.9 % — ABNORMAL LOW (ref 39.0–52.0)
Hemoglobin: 8.9 g/dL — ABNORMAL LOW (ref 13.0–17.0)
MCH: 32.4 pg (ref 26.0–34.0)
MCHC: 30.8 g/dL (ref 30.0–36.0)
MCV: 105.1 fL — ABNORMAL HIGH (ref 80.0–100.0)
Platelets: 147 10*3/uL — ABNORMAL LOW (ref 150–400)
RBC: 2.75 MIL/uL — ABNORMAL LOW (ref 4.22–5.81)
RDW: 23.1 % — ABNORMAL HIGH (ref 11.5–15.5)
WBC: 14.7 10*3/uL — ABNORMAL HIGH (ref 4.0–10.5)
nRBC: 0.1 % (ref 0.0–0.2)

## 2020-06-28 LAB — GLUCOSE, CAPILLARY
Glucose-Capillary: 131 mg/dL — ABNORMAL HIGH (ref 70–99)
Glucose-Capillary: 115 mg/dL — ABNORMAL HIGH (ref 70–99)
Glucose-Capillary: 200 mg/dL — ABNORMAL HIGH (ref 70–99)
Glucose-Capillary: 260 mg/dL — ABNORMAL HIGH (ref 70–99)

## 2020-06-28 MED ORDER — GLUCERNA SHAKE PO LIQD
237.0000 mL | Freq: Three times a day (TID) | ORAL | Status: DC
  Administered 2020-06-28 – 2020-07-10 (×26): 237 mL via ORAL

## 2020-06-28 MED ORDER — PANTOPRAZOLE SODIUM 40 MG PO TBEC
40.0000 mg | DELAYED_RELEASE_TABLET | Freq: Two times a day (BID) | ORAL | Status: DC
  Administered 2020-06-28 – 2020-07-10 (×24): 40 mg via ORAL
  Filled 2020-06-28 (×24): qty 1

## 2020-06-28 MED ORDER — METHYLPREDNISOLONE SODIUM SUCC 125 MG IJ SOLR
80.0000 mg | Freq: Two times a day (BID) | INTRAMUSCULAR | Status: AC
  Administered 2020-06-29 – 2020-07-03 (×9): 80 mg via INTRAVENOUS
  Filled 2020-06-28 (×9): qty 2

## 2020-06-28 MED ORDER — METHYLPREDNISOLONE SODIUM SUCC 40 MG IJ SOLR: 40.0000 mg | Freq: Three times a day (TID) | INTRAMUSCULAR | Status: DC

## 2020-06-28 NOTE — Progress Notes (Addendum)
NAME:  Jack Mejia, MRN:  [id number], DOB:  Apr 01, 1946, LOS: 4 ADMISSION DATE:  06/24/2020, CONSULTATION DATE:  12/2 REFERRING MD:  Denton Brick CHIEF COMPLAINT:  resp failure    Brief History   74 yowm remote smoker on chronic amiodarone rx for afib with worsening doe beginning  6 weeks PTA admitted 11/29 with presumed CAP/ chf with severe respiratory failure and PCCM consultation req 12/2   Baseline = able to push cart around [employer] x 2 aisles s 02   History of present illness    74 y.o. male with medical history significant of paroxysmal atrial fibrillation (status post cardioversion and chronically on apixaban), lumbar stenosis/chronic pain, type 2 diabetes with nephropathy, chronic kidney disease a stage IIIb, hypertension and hyperlipidemia; who presented to the hospital secondary to generalized weakness and increased urinary frequency.  Patient reports decreased appetite and recent visit to his PCP office secondary to acute right knee pain that was treated with prednisone for suspected gout flare.  Patient reports taking medications as prescribed prior to this event reporting stable sugar levels.  Most recent A1c 6.2 (04/30/20)  Patient denied chest pain, shortness of breath, fever, chills, dysuria, hematuria, nausea, vomiting, abdominal pain, focal weakness or any other complaints.  ED Course: No signs of acute infection appreciated.  Patient with acute hyperosmolar hyperglycemic state and normal anion gap.  Stable chronic kidney disease a stage IIIb.  IV fluids and IV insulin therapy initiated.        Past Medical History   Arthritis, Asthma, Atrial fibrillation ([place]), CAD (coronary artery disease) (06/29/2019), Cramps of left lower extremity, Depression, Diabetes mellitus, Dyspnea, Elevated liver enzymes, Fatty liver, History of kidney stones, Hyperlipidemia, Hypertension, Renal insufficiency, and Vertigo.    Significant Hospital Events     Consults:  GI   11/30 PCCM 12/2    Procedures:     Significant Diagnostic Tests:  Outpt 05/08/20 Echo 1. Left ventricular ejection fraction, by estimation, is 65 to 70%. The  left ventricle has normal function. The left ventricle has no regional  wall motion abnormalities. There is mild left ventricular hypertrophy.  Left ventricular diastolic parameters  are consistent with Grade I diastolic dysfunction (impaired relaxation).  2. Right ventricular systolic function is normal. The right ventricular  size is normal. There is mildly elevated pulmonary artery systolic  pressure. The estimated right ventricular systolic pressure is 50.0 mmHg.  3. The mitral valve is grossly normal. Trivial mitral valve  regurgitation.  LA was nl is size  4. The aortic valve is tricuspid. There is moderate calcification of the  aortic valve. Aortic valve regurgitation is not visualized. Moderate  aortic valve stenosis. Aortic valve mean gradient measures 21.5 mmHg.  Aortic valve Vmax measures 3.08 m/s.  Dimentionless index 0.36.  5. The inferior vena cava is normal in size with greater than 50%  respiratory variability, suggesting right atrial pressure of 3 mmHg  ESR 12/2 =  130 with bnp only 525 so "delta esr vs delta bnp markedly elevated  12/3  Started solumedrol 80 mg IV q 12 h  Micro Data:  BC x 2  11/29 >>> RESP viral Panel  PCR 11/29 neg MRSA PCR   11/30  Neg Strep urinary ag  11/30 neg  Legionella urinary ag  11/30 >>>neg    Antimicrobials:  Vanc IV  11/29 only Maxepime 11/29 only Doxy 11/29 > 12/2   Rocepin 11/29 >>>   Scheduled Meds: amiodarone given 12/2 and then d/c'd . sodium chloride  Intravenous Once  . atorvastatin  20 mg Oral Daily  . bumetanide (BUMEX) IV  2 mg Intravenous BID  . Chlorhexidine Gluconate Cloth  6 each Topical Daily  . diltiazem  360 mg Oral Daily  . feeding supplement (GLUCERNA SHAKE)  237 mL Oral TID  . hydrocortisone   Rectal BID  . insulin aspart  0-15 Units Subcutaneous Q4H   . insulin detemir  10 Units Subcutaneous Daily  . mouth rinse  15 mL Mouth Rinse BID  . [START ON 06/29/2020] methylPREDNISolone (SOLU-MEDROL) injection  80 mg Intravenous Q12H  . metoprolol tartrate  25 mg Oral BID  . pantoprazole  40 mg Oral BID AC   Continuous Infusions: . cefTRIAXone (ROCEPHIN)  IV 2 g (06/28/20 0215)   PRN Meds:.acetaminophen **OR** acetaminophen, albuterol, ALPRAZolam, polyethylene glycol  Interim history/subjective:   sitting up in bed at > 60degrees/ cough on inspiration   Objective   Blood pressure (!) 142/48, pulse 78, temperature 98.2 F (36.8 C), temperature source Oral, resp. rate (!) 25, height $RemoveBe'5\' 10"'mWLsmCJRE$  (1.778 m), weight 95.7 kg, SpO2 96 %.        Intake/Output Summary (Last 24 hours) at 06/28/2020 1418 Last data filed at 06/28/2020 0400 Gross per 24 hour  Intake --  Output 1300 ml  Net -1300 ml   Filed Weights   06/25/20 1046 06/27/20 0409 06/28/20 0439  Weight: 94.3 kg 96 kg 95.7 kg    Examination: Tmax 98.8  (no spikes this admit) 02 sats  98% On   15 lpm  Pt alert, approp nad @ 60 degrees  No jvd Oropharynx clear,  mucosa nl Neck supple Lungs with minimal insp crackles bases bilaterally RRR no s3  2-3/6 sem   Abd soft/nl excursion  Extr warm with no edema or clubbing noted Neuro  Sensorium intact,  no apparent motor deficits         Resolved Hospital Problem list      Assessment & Plan:  1) acute hypoxemic resp failure with diffuse pulmnary infiltrates ? Etiology   ddx from the [place] [facility] = [email or web address]  Miscellaneous:Alv microlithiasis, alv proteinosis, asp, bronchiectais, BOOP  Note esr 130  ARDS/ AIP Occupational dz/ HSP Neoplasm Infection  Pct does not support CAP/ urine antigens neg for strep/ legionella Drug  - amiodarone top of the list of suspects > no choice but stop it in this setting as very difficult to exclude and start 48 h solumedrol / ppi prophylaxis  Pulmonary emboli, Protein disorders Edema>   note last  LA on Echo PTA was nl so doubt this is simple diastolic dysfunction / chf  And bnp improving while sats are not  Eosinophilic dz- no Eos on peripheral smear Sarcoidosis Connective tissue dz Hist X  Hemorrhage  - no hemoptysis reported and anemia may be explained by gib Idiopathic     >>> start solumedrol 80 q 12 x 48 h trial     2) Diastolic dysfunction with AS see echo above, but note LA nl size just 6 week PTA >>>keep as dry as tol  3)CRI  Lab Results  Component Value Date   CREATININE 1.68 (H) 06/28/2020   CREATININE 1.57 (H) 06/26/2020   CREATININE 2.20 (H) 06/25/2020   avoid toxins, keep euvolemic - may need oracit to improve buffering   Labs   CBC: Recent Labs  Lab 06/24/20 1402 06/25/20 0208 06/26/20 0400 06/26/20 2235 06/28/20 0531  WBC 26.2* 26.3* 18.9*  --  14.7*  NEUTROABS 20.2*  --   --   --   --  HGB 7.5* 7.0* 7.0* 9.5* 8.9*  HCT 25.1* 22.8* 23.5* 30.3* 28.9*  MCV 121.3* 121.3* 118.7*  --  105.1*  PLT 239 208 165  --  147*    Basic Metabolic Panel: Recent Labs  Lab 06/24/20 1402 06/24/20 2319 06/25/20 0208 06/26/20 0400 06/28/20 0531  NA 135 137 138 141 134*  K 4.1 4.1 3.7 2.7* 2.9*  CL 99 104 106 110 104  CO2 20* 19* 19* 19* 19*  GLUCOSE 405* 317* 297* 159* 149*  BUN 46* 46* 45* 28* 26*  CREATININE 2.51* 2.44* 2.20* 1.57* 1.68*  CALCIUM 8.6* 8.1* 8.0* 6.7* 7.6*  MG  --  2.6*  --  2.0  --    GFR: Estimated Creatinine Clearance: 44.8 mL/min (A) (by C-G formula based on SCr of 1.68 mg/dL (H)). Recent Labs  Lab 06/24/20 1402 06/24/20 2017 06/24/20 2319 06/25/20 0208 06/26/20 0400 06/28/20 0531  PROCALCITON  --   --   --  0.48  --   --   WBC 26.2*  --   --  26.3* 18.9* 14.7*  LATICACIDVEN 3.4* 1.7 1.6  --   --   --     Liver Function Tests: Recent Labs  Lab 06/24/20 1402 06/25/20 0208 06/26/20 0400  AST 147* 61* 39  ALT 130* 96* 62*  ALKPHOS 146* 120 104  BILITOT 2.1* 1.8* 1.3*  PROT 7.4 6.6 5.4*  ALBUMIN 2.9* 2.6* 1.9*    No results for input(s): LIPASE, AMYLASE in the last 168 hours. No results for input(s): AMMONIA in the last 168 hours.  ABG    Component Value Date/Time   PHART 7.484 (H) 06/24/2020 1224   PCO2ART 28.6 (L) 06/24/2020 1224   PO2ART 94.7 06/24/2020 1224   HCO3 23.2 06/24/2020 1224   TCO2 21 (L) 05/23/2019 1021   ACIDBASEDEF 1.7 06/24/2020 1224   O2SAT 98.0 06/24/2020 1224     Coagulation Profile: Recent Labs  Lab 06/24/20 1402  INR 2.7*    Cardiac Enzymes: No results for input(s): CKTOTAL, CKMB, CKMBINDEX, TROPONINI in the last 168 hours.  HbA1C: Hemoglobin A1C  Date/Time Value Ref Range Status  04/30/2020 11:45 AM 6.2 (A) 4.0 - 5.6 % Final   Hgb A1c MFr Bld  Date/Time Value Ref Range Status  06/25/2020 02:08 AM 8.3 (H) 4.8 - 5.6 % Final    Comment:    (NOTE) Pre diabetes:          5.7%-6.4%  Diabetes:              >6.4%  Glycemic control for   <7.0% adults with diabetes   11/27/2019 10:43 AM 7.1 (H) 4.8 - 5.6 % Final    Comment:             Prediabetes: 5.7 - 6.4          Diabetes: >6.4          Glycemic control for adults with diabetes: <7.0     CBG: Recent Labs  Lab 06/27/20 1937 06/27/20 2252 06/28/20 0424 06/28/20 0745 06/28/20 1104  GLUCAP 299* 149* 131* 115* 200*    The patient is critically ill with multiple organ systems failure and requires high complexity decision making for assessment and support, frequent evaluation and titration of therapies, application of advanced monitoring technologies and extensive interpretation of multiple databases. Critical Care Time devoted to patient care services described in this note is 35 minutes.    Christinia Gully, MD Pulmonary and [place] Cell [phone]  After 7:00 pm call Elink  [phone]

## 2020-06-28 NOTE — TOC Progression Note (Signed)
Transition of Care [facility]) - Progression Note    Patient Details  Name: Jack Mejia MRN: [id number] Date of Birth: 1945-10-28  Transition of [facility] CM/SW Contact  Shade Flood, LCSW Phone Number: 06/28/2020, 11:47 AM  Clinical Narrative:     TOC following. Pt status reviewed with MD in progression today. MD indicating pt will likely need extended hospital stay to recover. Discussed LTAC referrals and though it appears pt meets criteria from a medical standpoint, his 4 day acute hospital stay is not yet lengthy enough to qualify pt for insurance coverage of LTAC transfer.  Plan to follow up next week and refer for LTAC if pt continues to meet medical criteria.  Expected Discharge Plan: [place] Barriers to Discharge: Continued Medical Work up  Expected Discharge Plan and Services Expected Discharge Plan: [place] In-house Referral: Clinical Social Work Discharge Planning Services: CM Consult   Living arrangements for the past 2 months: Single Family Home                 DME Arranged: N/A DME Agency: NA                   Social Determinants of Health (SDOH) Interventions    Readmission Risk Interventions No flowsheet data found.

## 2020-06-28 NOTE — Progress Notes (Signed)
PROGRESS NOTE  Jack Mejia GXQ:[id number] DOB: Jul 09, 1946 DOA: 06/24/2020 PCP: Erven Colla, DO  Brief History:  74 y.o.year old malewith a history of paroxysmal atrial fibrillation on apixaban, CKD stage III, hypertension, diastolic CHF, hyperlipidemia, diabetes mellitus type 2,macrocytic anemia following with hematology s/p iron injection and receiving Retacrit, and adenomatous colon polyps in 2011who was admitted 06/24/20 with sepsis and acuterespiratory failurein the setting of pneumonia and pulmonary edema withacute on chronic diastolic CHF.  Patient was also found to have intermittent rectal bleeding reported and acute on chronic anemia-, patient with persistent hypoxic respiratory failure more consistent with amiodarone pulmonary toxicity/pneumonitis   Assessment/Plan: Acute respiratory failure with hypoxia -Secondary to pneumonia and pulmonary edema, suspect component of amiodarone induced pneumonitis--sed rate up to 130 -Discussed with pulmonologist Dr. Melvyn Novas -Stopped amiodarone -We will add Solu-Medrol  -requiring up to 15 L of oxygen able to wean down to 10 L -Wean oxygen as tolerated for saturation greater 92%  - Acute on chronic diastolic CHF -The patient is clinically fluid overloaded -Volume overload status-worse with transfusion of PRBC -Apparently patient missed his a.m. Bumex dose --Continue diuresis with IV Bumex 2 mg every 12 hours, apparently patient is intolerant to Lasix -He has been able to tolerate torsemide at home -Daily weights -Accurate I's and O's -05/08/2020 echo EF 65 to 70%, no WMA, grade 1 DD, trivial MR, moderate AAS -BNP 883>>>625 (previous baseline around 300)  DM2- last A1c 8.3 reflecting uncontrolled DM with hyperglycemia -NovoLog sliding scale -Holding glipizide  CAP/Lobar pneumonia -COVID-19 PCR negative -Continue ceftriaxone and doxycycline -Continue bronchodilators and mucolytics  Severe sepsis -Present on  admission -Presented with leukocytosis and tachypnea -Secondary to pneumonia as above -Lactic acid peaked 3.4 -Continue  antibiotics as above  Acute on chronic renal failure CKD stage IIIb -Baseline creatinine 2.0-2.2 -Presented with serum creatinine 2.51 -Monitor closely with diuresis AKI----acute kidney injury on CKD stage - 3B--due to sepsis and hypoperfusion -  creatinine on admission=2.51  , baseline creatinine =2.0     , creatinine is now=1.68 ----renally adjust medications, avoid nephrotoxic agents / dehydration  / hypotension   Hematochezia/ABLA -GI consult -baseline Hgb 9-10 -presented with Hgb 7.5 -Received a total of 2 units of PRBC this admission -Hgb 8.9 -GI consult appreciated  Paroxysmal atrial fibrillation -holding apixaban due to hematochezia -Continue Cardizem CD Stop amiodarone due to concerns about possible amiodarone induced pneumonitis  Essential hypertension -Anticipate improvement with diuresis -Continue diltiazem CD -Continue metoprolol heart rate  Hyperlipidemia -continue statin  Hypokalemia/Hypomagnesemia--- replace and recheck, electrolytes abnormalities may persist due to high-dose diuretic use  Social/ethics--plan of care, advanced directives and goals of care discussed with patient and patient's 2 daughters [name] and [name] -At this time patient is a partial code specifically pt is a DNI,  but okay for CPR, BiPAP and ACLS drugs  Disposition--- patient may be appropriate for LTAC transfer  Status is: Inpatient  Remains inpatient appropriate because:Requiring IV Bumex, very very high flow nasal cannula at 15 L/min   Dispo: The patient is from: Home              Anticipated d/c is to: Home              Anticipated d/c date is: 2 days              Patient currently is not medically stable to d/c.   Family Communication: -Discussed with S/o at [phone] Discussed with daughters  x 2  Consultants:  none  Code Status:    Code  Status: Partial Code  DVT Prophylaxis: SCDs  Procedures: As Listed in Progress Note Above  Antibiotics: None  Subjective:  -Voiding okay, shortness of breath and hypoxia persist, -Would like more food,  Objective: Vitals:   06/28/20 0600 06/28/20 0700 06/28/20 0744 06/28/20 1105  BP: (!) 139/54 (!) 142/48    Pulse: 74 78 74 78  Resp: 19 (!) 27 17 (!) 25  Temp:   98.8 F (37.1 C) 98.2 F (36.8 C)  TempSrc:   Oral Oral  SpO2: 94% 91% 96% 96%  Weight:      Height:        Intake/Output Summary (Last 24 hours) at 06/28/2020 1314 Last data filed at 06/28/2020 0400 Gross per 24 hour  Intake --  Output 1300 ml  Net -1300 ml   Weight change: -0.3 kg Exam:   General:  Pt is alert, follows commands appropriately, conversational dyspnea  HEENT: No icterus, No thrush, No neck mass, [place]/AT  Nose-- HFNC 10L/min  Cardiovascular: RRR, S1/S2, no rubs, no gallops  Respiratory:  Diminished breath sounds , scattered Rhonchi  Abdomen: Soft/+BS, non tender, non distended, no guarding  Extremities: Good pedal pulses,   NeuroPsych--- generalized weakness without new focal deficits, affect is appropriate  Data Reviewed: I have personally reviewed following labs and imaging studies  Basic Metabolic Panel: Recent Labs  Lab 06/24/20 1402 06/24/20 2319 06/25/20 0208 06/26/20 0400 06/28/20 0531  NA 135 137 138 141 134*  K 4.1 4.1 3.7 2.7* 2.9*  CL 99 104 106 110 104  CO2 20* 19* 19* 19* 19*  GLUCOSE 405* 317* 297* 159* 149*  BUN 46* 46* 45* 28* 26*  CREATININE 2.51* 2.44* 2.20* 1.57* 1.68*  CALCIUM 8.6* 8.1* 8.0* 6.7* 7.6*  MG  --  2.6*  --  2.0  --    Liver Function Tests: Recent Labs  Lab 06/24/20 1402 06/25/20 0208 06/26/20 0400  AST 147* 61* 39  ALT 130* 96* 62*  ALKPHOS 146* 120 104  BILITOT 2.1* 1.8* 1.3*  PROT 7.4 6.6 5.4*  ALBUMIN 2.9* 2.6* 1.9*   No results for input(s): LIPASE, AMYLASE in the last 168 hours. No results for input(s): AMMONIA in the  last 168 hours. Coagulation Profile: Recent Labs  Lab 06/24/20 1402  INR 2.7*   CBC: Recent Labs  Lab 06/24/20 1402 06/25/20 0208 06/26/20 0400 06/26/20 2235 06/28/20 0531  WBC 26.2* 26.3* 18.9*  --  14.7*  NEUTROABS 20.2*  --   --   --   --   HGB 7.5* 7.0* 7.0* 9.5* 8.9*  HCT 25.1* 22.8* 23.5* 30.3* 28.9*  MCV 121.3* 121.3* 118.7*  --  105.1*  PLT 239 208 165  --  147*   Cardiac Enzymes: No results for input(s): CKTOTAL, CKMB, CKMBINDEX, TROPONINI in the last 168 hours. BNP: Invalid input(s): POCBNP CBG: Recent Labs  Lab 06/27/20 1937 06/27/20 2252 06/28/20 0424 06/28/20 0745 06/28/20 1104  GLUCAP 299* 149* 131* 115* 200*   HbA1C: No results for input(s): HGBA1C in the last 72 hours. Urine analysis:    Component Value Date/Time   COLORURINE COLORLESS (A) 06/06/2020 1151   APPEARANCEUR CLEAR 06/06/2020 1151   LABSPEC 1.013 06/06/2020 1151   PHURINE 5.0 06/06/2020 1151   GLUCOSEU >=500 (A) 06/06/2020 1151   HGBUR MODERATE (A) 06/06/2020 1151   BILIRUBINUR NEGATIVE 06/06/2020 1151   KETONESUR NEGATIVE 06/06/2020 [place] 06/06/2020 1151  NITRITE NEGATIVE 06/06/2020 [place] 06/06/2020 1151   Sepsis Labs: @LABRCNTIP (procalcitonin:4,lacticidven:4) ) Recent Results (from the past 240 hour(s))  Resp Panel by RT-PCR (Flu A&B, Covid) Nasopharyngeal Swab     Status: None   Collection Time: 06/24/20 11:43 AM   Specimen: Nasopharyngeal Swab; Nasopharyngeal(NP) swabs in vial transport medium  Result Value Ref Range Status   SARS Coronavirus 2 by RT PCR NEGATIVE NEGATIVE Final    Comment: (NOTE) SARS-CoV-2 target nucleic acids are NOT DETECTED.  The SARS-CoV-2 RNA is generally detectable in upper respiratory specimens during the acute phase of infection. The lowest concentration of SARS-CoV-2 viral copies this assay can detect is 138 copies/mL. A negative result does not preclude SARS-Cov-2 infection and should not be used  as the sole basis for treatment or other patient management decisions. A negative result may occur with  improper specimen collection/handling, submission of specimen other than nasopharyngeal swab, presence of viral mutation(s) within the areas targeted by this assay, and inadequate number of viral copies(<138 copies/mL). A negative result must be combined with clinical observations, patient history, and epidemiological information. The expected result is Negative.  Fact Sheet for Patients:  EntrepreneurPulse.com.au  Fact Sheet for Healthcare Providers:  IncredibleEmployment.be  This test is no t yet approved or cleared by the [place] FDA and  has been authorized for detection and/or diagnosis of SARS-CoV-2 by FDA under an Emergency Use Authorization (EUA). This EUA will remain  in effect (meaning this test can be used) for the duration of the COVID-19 declaration under Section 564(b)(1) of the Act, 21 U.S.C.section 360bbb-3(b)(1), unless the authorization is terminated  or revoked sooner.       Influenza A by PCR NEGATIVE NEGATIVE Final   Influenza B by PCR NEGATIVE NEGATIVE Final    Comment: (NOTE) The Xpert Xpress SARS-CoV-2/FLU/RSV plus assay is intended as an aid in the diagnosis of influenza from Nasopharyngeal swab specimens and should not be used as a sole basis for treatment. Nasal washings and aspirates are unacceptable for Xpert Xpress SARS-CoV-2/FLU/RSV testing.  Fact Sheet for Patients: EntrepreneurPulse.com.au  Fact Sheet for Healthcare Providers: IncredibleEmployment.be  This test is not yet approved or cleared by the [place] FDA and has been authorized for detection and/or diagnosis of SARS-CoV-2 by FDA under an Emergency Use Authorization (EUA). This EUA will remain in effect (meaning this test can be used) for the duration of the COVID-19 declaration under Section 564(b)(1)  of the Act, 21 U.S.C. section 360bbb-3(b)(1), unless the authorization is terminated or revoked.  Performed at [facility], [address], [place], [place] [address]   Blood culture (routine x 2)     Status: None (Preliminary result)   Collection Time: 06/24/20  2:04 PM   Specimen: Left Antecubital; Blood  Result Value Ref Range Status   Specimen Description   Final    LEFT ANTECUBITAL BOTTLES DRAWN AEROBIC AND ANAEROBIC   Special Requests Blood Culture adequate volume  Final   Culture   Final    NO GROWTH 4 DAYS Performed at [facility], [address], [place], [place] [address]    Report Status PENDING  Incomplete  Blood culture (routine x 2)     Status: None (Preliminary result)   Collection Time: 06/24/20  2:04 PM   Specimen: BLOOD LEFT HAND  Result Value Ref Range Status   Specimen Description   Final    BLOOD LEFT HAND BOTTLES DRAWN AEROBIC AND ANAEROBIC   Special Requests Blood Culture adequate volume  Final   Culture   Final    NO GROWTH 4 DAYS Performed at [facility], [address], [place], [place] [address]    Report Status PENDING  Incomplete  MRSA PCR Screening     Status: None   Collection Time: 06/25/20  2:36 PM   Specimen: Nasopharyngeal  Result Value Ref Range Status   MRSA by PCR NEGATIVE NEGATIVE Final    Comment:        The GeneXpert MRSA Assay (FDA approved for NASAL specimens only), is one component of a comprehensive MRSA colonization surveillance program. It is not intended to diagnose MRSA infection nor to guide or monitor treatment for MRSA infections. Performed at [facility], [address] [place], [place] [address]      Scheduled Meds: . sodium chloride   Intravenous Once  . atorvastatin  20 mg Oral Daily  . bumetanide (BUMEX) IV  2 mg Intravenous BID  . Chlorhexidine Gluconate Cloth  6 each Topical Daily  . diltiazem  360 mg Oral Daily  . feeding supplement (GLUCERNA SHAKE)  237 mL Oral TID  . hydrocortisone   Rectal BID  .  insulin aspart  0-15 Units Subcutaneous Q4H  . insulin detemir  10 Units Subcutaneous Daily  . mouth rinse  15 mL Mouth Rinse BID  . methylPREDNISolone (SOLU-MEDROL) injection  40 mg Intravenous Q8H  . metoprolol tartrate  25 mg Oral BID   Continuous Infusions: . cefTRIAXone (ROCEPHIN)  IV 2 g (06/28/20 0215)    Procedures/Studies: DG Elbow Complete Left  Result Date: 06/06/2020 CLINICAL DATA:  Pain following fall EXAM: LEFT ELBOW - COMPLETE 3+ VIEW COMPARISON:  None. FINDINGS: Frontal, lateral, and bilateral oblique views were obtained. No fracture, dislocation, or joint effusion. There is spurring along the medial distal humeral condyle. No appreciable joint space narrowing or erosion. IMPRESSION: Spurring along the medial distal humeral condyle. No appreciable joint space narrowing. No fracture or dislocation. Electronically Signed   By: Lowella Grip III M.D.   On: 06/06/2020 12:18   US RENAL  Result Date: 06/17/2020 CLINICAL DATA:  Chronic renal disease stage IV EXAM: RENAL / URINARY TRACT ULTRASOUND COMPLETE COMPARISON:  April 21, 2019 FINDINGS: Right Kidney: Renal measurements: 10.7 cm x 5.2 cm x 5.5 cm = volume: 162.9 mL. Echogenicity within normal limits. A 3.1 cm x 3.3 cm x 2.7 cm complex anechoic structure is seen within the mid right kidney. No hydronephrosis is visualized. Left Kidney: Renal measurements: 12.3 cm x 7.2 cm x 6.1 cm = volume: 283.3 mL. Echogenicity within normal limits. 2.1 cm x 2.1 cm x 2.3 cm and 3.0 cm x 2.1 cm x 2.4 cm anechoic structures are seen within the mid and lower left kidney. An 8.5 mm shadowing echogenic focus is also seen within the lower pole of the left kidney. No hydronephrosis is visualized. Bladder: Appears normal for degree of bladder distention. Bilateral ureteral jets are visualized. Other: None. IMPRESSION: 1. Bilateral renal cysts, mildly increased in size when compared to the prior study. 2. Subcentimeter non-obstructing renal stone  within the lower pole of the left kidney. Electronically Signed   By: Virgina Norfolk M.D.   On: 06/17/2020 23:57   DG CHEST PORT 1 VIEW  Result Date: 06/27/2020 CLINICAL DATA:  Shortness of breath. EXAM: PORTABLE CHEST 1 VIEW COMPARISON:  06/24/2020.  CT abdomen 06/19/2014. FINDINGS: Stable cardiomegaly. Progressive diffuse severe bilateral pulmonary infiltrates/edema. No prominent pleural effusion. No pneumothorax. Stable calcific densities noted over the right upper quadrant,  most likely dystrophic calcification. Degenerative change thoracic spine and both shoulders. IMPRESSION: 1. Stable cardiomegaly. 2. Progressive diffuse severe bilateral pulmonary infiltrates/edema. Electronically Signed   By: Marcello Moores  Register   On: 06/27/2020 05:56   DG Chest Port 1 View  Result Date: 06/24/2020 CLINICAL DATA:  Shortness of breath. EXAM: PORTABLE CHEST 1 VIEW COMPARISON:  Dec 12, 2019. FINDINGS: Stable cardiomediastinal silhouette. No pneumothorax or pleural effusion is noted. Bilateral patchy airspace opacities are now noted concerning for multifocal pneumonia. Bony thorax is unremarkable IMPRESSION: Bilateral patchy airspace opacities are noted concerning for multifocal pneumonia. Electronically Signed   By: Marijo Conception M.D.   On: 06/24/2020 11:37   DG Hip Unilat W or Wo Pelvis 2-3 Views Left  Result Date: 06/06/2020 CLINICAL DATA:  Left hip pain since a fall 06/03/2020. Initial encounter. EXAM: DG HIP (WITH OR WITHOUT PELVIS) 2-3V LEFT COMPARISON:  Plain films left hip 03/23/2020. FINDINGS: There is no acute bony or joint abnormality. Hip joint spaces are preserved. Scattered enthesopathic change noted. IMPRESSION: No acute abnormality. Electronically Signed   By: Inge Rise M.D.   On: 06/06/2020 12:18   US Abdomen Limited RUQ (LIVER/GB)  Result Date: 06/25/2020 CLINICAL DATA:  Elevated LFTs, post cholecystectomy EXAM: ULTRASOUND ABDOMEN LIMITED RIGHT UPPER QUADRANT COMPARISON:  September of  2018 CT evaluation FINDINGS: Gallbladder: Post cholecystectomy. Common bile duct: Diameter: 4.2 mm Liver: Mildly increased echogenicity of hepatic parenchyma without visible lesion on limited images. Portal vein is patent on color Doppler imaging with normal direction of blood flow towards the liver. Other: RIGHT pleural effusion, may be moderate. IMPRESSION: 1. Hepatic steatosis, mild. 2. No biliary duct dilation post cholecystectomy. 3. RIGHT-sided pleural effusion partially imaged. Electronically Signed   By: Zetta Bills M.D.   On: 06/25/2020 15:07    Tekla Malachowski Denton Brick, MD  [facility]  If 7PM-7AM, please contact night-coverage [email or web address] Password [email or web address] 06/28/2020, 1:14 PM   LOS: 4 days

## 2020-06-28 NOTE — Progress Notes (Signed)
Palliative Medicine RN Note: PMT Provider at [facility], Mariana Kaufman, NP, spoke w Dr Denton Brick, who told Jack Mejia that we were not needed at that time. PMT provider is scheduled to return to [facility] on 12/7. If Jack Mejia remains admitted at that time and attending service would like our assistance, we will evaluate his case on 12/7.  Marjie Skiff Damonique Brunelle, RN, BSN, [facility] Palliative Medicine Team 06/28/2020 3:34 PM Office [phone]

## 2020-06-28 NOTE — Progress Notes (Signed)
Subjective: Occasional abdominal pain (lower, bladder area) with urination. Had a bowel movement today, didn't look if any blood/melena. Still struggling with breathing, he isn't sure why he can't breathe. Denies other abdominal pain, N/V. Keeping diet down ok. Discussed blood loss and possible endoscopic eval pending pulmonary improvement.  Objective: Vital signs in last 24 hours: Temp:  [98.2 F (36.8 C)-98.8 F (37.1 C)] 98.8 F (37.1 C) (12/03 0744) Pulse Rate:  [66-78] 74 (12/03 0744) Resp:  [11-31] 17 (12/03 0744) BP: (134-153)/(48-69) 142/48 (12/03 0700) SpO2:  [89 %-99 %] 96 % (12/03 0744) Weight:  [95.7 kg] 95.7 kg (12/03 0439) Last BM Date: 06/27/20 General:   Alert and oriented, pleasant Head:  Normocephalic and atraumatic. Eyes:  No icterus, sclera clear. Heart:  S1, S2 present, no murmurs noted.  Lungs: Clear to auscultation bilaterally, without wheezing, rales, or rhonchi. On HFNC, unable to complete full sentences, 94% sat on monitor. Abdomen:  Bowel sounds present, soft, non-tender, non-distended. No HSM or hernias noted. No rebound or guarding. Msk:  Symmetrical without gross deformities. Extremities:  Without clubbing or edema. Neurologic:  Alert and  oriented x4;  grossly normal neurologically. Psych:  Alert and cooperative. Normal mood and affect.  Intake/Output from previous day: 12/02 0701 - 12/03 0700 In: 0  Out: 2050 [Urine:2050] Intake/Output this shift: No intake/output data recorded.  Lab Results: Recent Labs    06/26/20 0400 06/26/20 2235 06/28/20 0531  WBC 18.9*  --  14.7*  HGB 7.0* 9.5* 8.9*  HCT 23.5* 30.3* 28.9*  PLT 165  --  147*   BMET Recent Labs    06/26/20 0400 06/28/20 0531  NA 141 134*  K 2.7* 2.9*  CL 110 104  CO2 19* 19*  GLUCOSE 159* 149*  BUN 28* 26*  CREATININE 1.57* 1.68*  CALCIUM 6.7* 7.6*   LFT Recent Labs    06/26/20 0400  PROT 5.4*  ALBUMIN 1.9*  AST 39  ALT 62*  ALKPHOS 104  BILITOT 1.3*    PT/INR No results for input(s): LABPROT, INR in the last 72 hours. Hepatitis Panel Recent Labs    06/25/20 1040 06/25/20 1040 06/26/20 2235  HEPBSAG NON REACTIVE   < > NON REACTIVE  HCVAB NON REACTIVE  --   --   HEPAIGM NON REACTIVE  --   --   HEPBIGM NON REACTIVE  --   --    < > = values in this interval not displayed.     Studies/Results: DG CHEST PORT 1 VIEW  Result Date: 06/27/2020 CLINICAL DATA:  Shortness of breath. EXAM: PORTABLE CHEST 1 VIEW COMPARISON:  06/24/2020.  CT abdomen 06/19/2014. FINDINGS: Stable cardiomegaly. Progressive diffuse severe bilateral pulmonary infiltrates/edema. No prominent pleural effusion. No pneumothorax. Stable calcific densities noted over the right upper quadrant, most likely dystrophic calcification. Degenerative change thoracic spine and both shoulders. IMPRESSION: 1. Stable cardiomegaly. 2. Progressive diffuse severe bilateral pulmonary infiltrates/edema. Electronically Signed   By: Marcello Moores  Register   On: 06/27/2020 05:56    Assessment: 74 year old male with history of paroxysmal atrial fibrillation on apixaban, kidney disease stage III, hypertension, diastolic CHF, hyperlipidemia, diabetes, macrocytic anemia following with hematology status post iron infusion and receiving Retacrit, admitted November 29 with sepsis and acute respiratory failure in the setting of pneumonia and pulmonary edema with acute on chronic diastolic CHF.  GI was consulted due to intermittent rectal bleeding and acute on chronic anemia.  Anemia: In the setting of anticoagulation.  Reported intermittent rectal bleeding with no further  gross GI bleeding noted this admission.  Iron studies not consistent with IDA, likely multifactorial anemia.  Last colonoscopy October 2011, several polyps removed, couple were tubular adenomas.  Patient declined repeat colonoscopy in 2016.  Has received 2 units of packed red blood cells. Currently not a candidate for endoscopic evaluation  from a respiratory standpoint. Hgb mild decline from 9.5 yesterday to 8.9 today.  Elevated LFTs: Intermittent dating back to 2012 in the setting of fatty liver.  Likely elevated in the setting of acute illness, nearly normalized 12/1.  Viral markers negative.   Right upper quadrant ultrasound with fatty liver.  Acute respiratory failure with hypoxia: Secondary to pneumonia and pulmonary edema.  Currently on 15 L of high flow nasal cannula.  BNP 625 yesterday.  Chest x-ray yesterday with progressive diffuse severe bilateral pulmonary infiltrates/edema. Continues to struggle with respiratory status.   Plan: 1. Continue to hold on endoscopic evaluation due to poor respiratory status 2. Agree with palliative care consult 3. Appreciate pulmonary support/consult 4. Follow H/H and transfuse for hgb < 9 5. Supportive measures   Thank you for allowing [place] to participate in the care of [place], DNP, AGNP-C Adult & Gerontological Nurse Practitioner [facility]    LOS: 4 days    06/28/2020, 8:37 AM

## 2020-06-29 LAB — CULTURE, BLOOD (ROUTINE X 2)
Culture: NO GROWTH
Culture: NO GROWTH
Special Requests: ADEQUATE
Special Requests: ADEQUATE

## 2020-06-29 LAB — GLUCOSE, CAPILLARY
Glucose-Capillary: 116 mg/dL — ABNORMAL HIGH (ref 70–99)
Glucose-Capillary: 155 mg/dL — ABNORMAL HIGH (ref 70–99)
Glucose-Capillary: 257 mg/dL — ABNORMAL HIGH (ref 70–99)
Glucose-Capillary: 296 mg/dL — ABNORMAL HIGH (ref 70–99)
Glucose-Capillary: 458 mg/dL — ABNORMAL HIGH (ref 70–99)
Glucose-Capillary: 463 mg/dL — ABNORMAL HIGH (ref 70–99)

## 2020-06-29 MED ORDER — INSULIN ASPART 100 UNIT/ML ~~LOC~~ SOLN
18.0000 [IU] | Freq: Once | SUBCUTANEOUS | Status: AC
  Administered 2020-06-29: 18 [IU] via SUBCUTANEOUS

## 2020-06-29 MED ORDER — POTASSIUM CHLORIDE CRYS ER 20 MEQ PO TBCR
40.0000 meq | EXTENDED_RELEASE_TABLET | Freq: Once | ORAL | Status: AC
  Administered 2020-06-29: 40 meq via ORAL
  Filled 2020-06-29: qty 2

## 2020-06-29 NOTE — Progress Notes (Signed)
PROGRESS NOTE  Jack Mejia PXT:[id number] DOB: 1946/04/23 DOA: 06/24/2020 PCP: Erven Colla, DO  Brief History:  74 y.o.year old malewith a history of paroxysmal atrial fibrillation on apixaban, CKD stage III, hypertension, diastolic CHF, hyperlipidemia, diabetes mellitus type 2,macrocytic anemia following with hematology s/p iron injection and receiving Retacrit, and adenomatous colon polyps in 2011who was admitted 06/24/20 with sepsis and acuterespiratory failurein the setting of pneumonia and pulmonary edema withacute on chronic diastolic CHF.  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 Melvyn Novas -Stopped amiodarone -Continue IV Solu-Medrol started 06/28/2020 -requiring up to 15 L of oxygen able to wean down to 10 L -Wean oxygen as tolerated for saturation greater 92% BC x 2  11/29 >>> RESP viral Panel  PCR 11/29 neg MRSA PCR   11/30  Neg Strep urinary ag  11/30 neg  Legionella urinary ag  11/30 >>>neg  -  Acute on chronic diastolic CHF -Volume overload status-worsened with transfusion of PRBC -Improved with IV Bumex --Continue diuresis with IV Bumex 2 mg every 12 hours, apparently patient is intolerant to Lasix -He has been able to tolerate torsemide at home -Daily weights -Accurate I's and O's -05/08/2020 echo EF 65 to 70%, no WMA, grade 1 DD, trivial MR, moderate AAS -BNP 883>>>625 (previous baseline around 300)  DM2- last A1c 8.3 reflecting uncontrolled DM with hyperglycemia -NovoLog sliding scale -Holding  glipizide  CAP/Lobar pneumonia -COVID-19 PCR negative -Continue ceftriaxone and doxycycline -Continue bronchodilators and mucolytics  Severe sepsis -Present on admission -Presented with leukocytosis and tachypnea -Secondary to pneumonia as above -Lactic acid peaked 3.4 -Continue  antibiotics as above  Acute on chronic renal failure CKD stage IIIb -Baseline creatinine 2.0-2.2 -Presented with serum creatinine 2.51 -Monitor closely with diuresis AKI----acute kidney injury on CKD stage - 3B--due to sepsis and hypoperfusion -  creatinine on admission=2.51  , baseline creatinine =2.0     , creatinine is now=1.68 ----renally adjust medications, avoid nephrotoxic agents / dehydration  / hypotension   Hematochezia/ABLA -GI consult -baseline Hgb 9-10 -presented with Hgb 7.5 -Received a total of 2 units of PRBC this admission -Hgb 8.9 -GI consult appreciated  Paroxysmal atrial fibrillation -holding apixaban due to hematochezia -Continue Cardizem CD Stop amiodarone due to concerns about possible amiodarone induced pneumonitis  Essential hypertension -Anticipate improvement with diuresis -Continue diltiazem CD -Continue metoprolol heart rate  Hyperlipidemia -continue statin  Hypokalemia/Hypomagnesemia--- replace and recheck, electrolytes abnormalities may persist due to high-dose diuretic use  Social/ethics--plan of care, advanced directives and goals of care discussed with patient and patient's 2 daughters [name] and [name] -At this time patient is a partial code specifically pt is a DNI,  but okay for CPR, BiPAP and ACLS drugs  Disposition--- patient may be appropriate for LTAC transfer, TOC looking into it  Status is: Inpatient  Remains inpatient appropriate because:Requiring IV Bumex, very very high flow nasal cannula at 15 L/min   Dispo: The patient is from: Home              Anticipated d/c is to: LTAC              Anticipated d/c date  is: 2 days               Patient currently is not medically stable to d/c.   Family Communication: -Discussed with S/o at [phone] Discussed with daughters x 2  Consultants:  Pulm  Code Status:    Code Status: Partial Code  DVT Prophylaxis: SCDs  Procedures: As Listed in Progress Note Above  Antibiotics: Rocephin/doxy  Subjective:  -Cough shortness of breath and hypoxia is not worse --Continues to struggle with minimum activity and positional change No fever  Or chills   Objective: Vitals:   06/29/20 0500 06/29/20 0600 06/29/20 0743 06/29/20 0815  BP: (!) 144/57 (!) 151/52    Pulse: 73 72    Resp: (!) 32 18    Temp:   98.4 F (36.9 C)   TempSrc:   Oral   SpO2: 94% 96%  94%  Weight:      Height:        Intake/Output Summary (Last 24 hours) at 06/29/2020 0843 Last data filed at 06/29/2020 0000 Gross per 24 hour  Intake --  Output 2000 ml  Net -2000 ml   Weight change:  Exam:   General:  Pt is alert, follows commands appropriately, conversational dyspnea  HEENT: No icterus, No thrush, No neck mass, [place]/AT  Nose-- HFNC 10L/min  Cardiovascular: RRR, S1/S2, no rubs, no gallops  Respiratory:  Diminished breath sounds , scattered Rhonchi  Abdomen: Soft/+BS, non tender, non distended, no guarding  Extremities: Good pedal pulses,   NeuroPsych--- generalized weakness without new focal deficits, affect is appropriate  Data Reviewed: I have personally reviewed following labs and imaging studies  Basic Metabolic Panel: Recent Labs  Lab 06/24/20 1402 06/24/20 2319 06/25/20 0208 06/26/20 0400 06/28/20 0531  NA 135 137 138 141 134*  K 4.1 4.1 3.7 2.7* 2.9*  CL 99 104 106 110 104  CO2 20* 19* 19* 19* 19*  GLUCOSE 405* 317* 297* 159* 149*  BUN 46* 46* 45* 28* 26*  CREATININE 2.51* 2.44* 2.20* 1.57* 1.68*  CALCIUM 8.6* 8.1* 8.0* 6.7* 7.6*  MG  --  2.6*  --  2.0  --    Liver Function Tests: Recent Labs  Lab 06/24/20 1402 06/25/20 0208 06/26/20 0400  AST 147* 61* 39   ALT 130* 96* 62*  ALKPHOS 146* 120 104  BILITOT 2.1* 1.8* 1.3*  PROT 7.4 6.6 5.4*  ALBUMIN 2.9* 2.6* 1.9*   No results for input(s): LIPASE, AMYLASE in the last 168 hours. No results for input(s): AMMONIA in the last 168 hours. Coagulation Profile: Recent Labs  Lab 06/24/20 1402  INR 2.7*   CBC: Recent Labs  Lab 06/24/20 1402 06/25/20 0208 06/26/20 0400 06/26/20 2235 06/28/20 0531  WBC 26.2* 26.3* 18.9*  --  14.7*  NEUTROABS 20.2*  --   --   --   --   HGB 7.5* 7.0* 7.0* 9.5* 8.9*  HCT 25.1* 22.8* 23.5* 30.3* 28.9*  MCV 121.3* 121.3* 118.7*  --  105.1*  PLT 239 208 165  --  147*   Cardiac Enzymes: No results for input(s): CKTOTAL, CKMB, CKMBINDEX, TROPONINI in the last 168 hours. BNP: Invalid input(s): POCBNP CBG: Recent Labs  Lab 06/28/20 1104 06/28/20 2053 06/29/20 0053 06/29/20 0405 06/29/20 0719  GLUCAP 200* 260* 257* 116* 155*   HbA1C: No results for input(s): HGBA1C in the last 72 hours. Urine analysis:    Component Value Date/Time   COLORURINE COLORLESS (A) 06/06/2020 [place] 06/06/2020 1151  LABSPEC 1.013 06/06/2020 1151   PHURINE 5.0 06/06/2020 1151   GLUCOSEU >=500 (A) 06/06/2020 1151   HGBUR MODERATE (A) 06/06/2020 1151   BILIRUBINUR NEGATIVE 06/06/2020 1151   KETONESUR NEGATIVE 06/06/2020 1151   PROTEINUR NEGATIVE 06/06/2020 1151   NITRITE NEGATIVE 06/06/2020 1151   LEUKOCYTESUR NEGATIVE 06/06/2020 1151   Sepsis Labs: @LABRCNTIP (procalcitonin:4,lacticidven:4) ) Recent Results (from the past 240 hour(s))  Resp Panel by RT-PCR (Flu A&B, Covid) Nasopharyngeal Swab     Status: None   Collection Time: 06/24/20 11:43 AM   Specimen: Nasopharyngeal Swab; Nasopharyngeal(NP) swabs in vial transport medium  Result Value Ref Range Status   SARS Coronavirus 2 by RT PCR NEGATIVE NEGATIVE Final    Comment: (NOTE) SARS-CoV-2 target nucleic acids are NOT DETECTED.  The SARS-CoV-2 RNA is generally detectable in upper  respiratory specimens during the acute phase of infection. The lowest concentration of SARS-CoV-2 viral copies this assay can detect is 138 copies/mL. A negative result does not preclude SARS-Cov-2 infection and should not be used as the sole basis for treatment or other patient management decisions. A negative result may occur with  improper specimen collection/handling, submission of specimen other than nasopharyngeal swab, presence of viral mutation(s) within the areas targeted by this assay, and inadequate number of viral copies(<138 copies/mL). A negative result must be combined with clinical observations, patient history, and epidemiological information. The expected result is Negative.  Fact Sheet for Patients:  EntrepreneurPulse.com.au  Fact Sheet for Healthcare Providers:  IncredibleEmployment.be  This test is no t yet approved or cleared by the [place] FDA and  has been authorized for detection and/or diagnosis of SARS-CoV-2 by FDA under an Emergency Use Authorization (EUA). This EUA will remain  in effect (meaning this test can be used) for the duration of the COVID-19 declaration under Section 564(b)(1) of the Act, 21 U.S.C.section 360bbb-3(b)(1), unless the authorization is terminated  or revoked sooner.       Influenza A by PCR NEGATIVE NEGATIVE Final   Influenza B by PCR NEGATIVE NEGATIVE Final    Comment: (NOTE) The Xpert Xpress SARS-CoV-2/FLU/RSV plus assay is intended as an aid in the diagnosis of influenza from Nasopharyngeal swab specimens and should not be used as a sole basis for treatment. Nasal washings and aspirates are unacceptable for Xpert Xpress SARS-CoV-2/FLU/RSV testing.  Fact Sheet for Patients: EntrepreneurPulse.com.au  Fact Sheet for Healthcare Providers: IncredibleEmployment.be  This test is not yet approved or cleared by the [place] FDA and has been  authorized for detection and/or diagnosis of SARS-CoV-2 by FDA under an Emergency Use Authorization (EUA). This EUA will remain in effect (meaning this test can be used) for the duration of the COVID-19 declaration under Section 564(b)(1) of the Act, 21 U.S.C. section 360bbb-3(b)(1), unless the authorization is terminated or revoked.  Performed at [facility], [address] [place], [place] [address]   Blood culture (routine x 2)     Status: None   Collection Time: 06/24/20  2:04 PM   Specimen: Left Antecubital; Blood  Result Value Ref Range Status   Specimen Description   Final    LEFT ANTECUBITAL BOTTLES DRAWN AEROBIC AND ANAEROBIC   Special Requests Blood Culture adequate volume  Final   Culture   Final    NO GROWTH 5 DAYS Performed at [facility], [address], [place], [place] [address]    Report Status 06/29/2020 FINAL  Final  Blood culture (routine x 2)     Status: None   Collection Time: 06/24/20  2:04 PM   Specimen: BLOOD LEFT HAND  Result Value Ref Range Status   Specimen Description   Final    BLOOD LEFT HAND BOTTLES DRAWN AEROBIC AND ANAEROBIC   Special Requests Blood Culture adequate volume  Final   Culture   Final    NO GROWTH 5 DAYS Performed at [facility], [address], [place], [place] [address]    Report Status 06/29/2020 FINAL  Final  MRSA PCR Screening     Status: None   Collection Time: 06/25/20  2:36 PM   Specimen: Nasopharyngeal  Result Value Ref Range Status   MRSA by PCR NEGATIVE NEGATIVE Final    Comment:        The GeneXpert MRSA Assay (FDA approved for NASAL specimens only), is one component of a comprehensive MRSA colonization surveillance program. It is not intended to diagnose MRSA infection nor to guide or monitor treatment for MRSA infections. Performed at [facility], [address] [place], [place] [address]      Scheduled Meds: . sodium chloride   Intravenous Once  . atorvastatin  20 mg Oral Daily  . bumetanide (BUMEX)  IV  2 mg Intravenous BID  . Chlorhexidine Gluconate Cloth  6 each Topical Daily  . diltiazem  360 mg Oral Daily  . feeding supplement (GLUCERNA SHAKE)  237 mL Oral TID  . hydrocortisone   Rectal BID  . insulin aspart  0-15 Units Subcutaneous Q4H  . insulin detemir  10 Units Subcutaneous Daily  . mouth rinse  15 mL Mouth Rinse BID  . methylPREDNISolone (SOLU-MEDROL) injection  80 mg Intravenous Q12H  . metoprolol tartrate  25 mg Oral BID  . pantoprazole  40 mg Oral BID AC  . potassium chloride  40 mEq Oral Once   Continuous Infusions:   Procedures/Studies: DG Elbow Complete Left  Result Date: 06/06/2020 CLINICAL DATA:  Pain following fall EXAM: LEFT ELBOW - COMPLETE 3+ VIEW COMPARISON:  None. FINDINGS: Frontal, lateral, and bilateral oblique views were obtained. No fracture, dislocation, or joint effusion. There is spurring along the medial distal humeral condyle. No appreciable joint space narrowing or erosion. IMPRESSION: Spurring along the medial distal humeral condyle. No appreciable joint space narrowing. No fracture or dislocation. Electronically Signed   By: Lowella Grip III M.D.   On: 06/06/2020 12:18   US RENAL  Result Date: 06/17/2020 CLINICAL DATA:  Chronic renal disease stage IV EXAM: RENAL / URINARY TRACT ULTRASOUND COMPLETE COMPARISON:  April 21, 2019 FINDINGS: Right Kidney: Renal measurements: 10.7 cm x 5.2 cm x 5.5 cm = volume: 162.9 mL. Echogenicity within normal limits. A 3.1 cm x 3.3 cm x 2.7 cm complex anechoic structure is seen within the mid right kidney. No hydronephrosis is visualized. Left Kidney: Renal measurements: 12.3 cm x 7.2 cm x 6.1 cm = volume: 283.3 mL. Echogenicity within normal limits. 2.1 cm x 2.1 cm x 2.3 cm and 3.0 cm x 2.1 cm x 2.4 cm anechoic structures are seen within the mid and lower left kidney. An 8.5 mm shadowing echogenic focus is also seen within the lower pole of the left kidney. No hydronephrosis is visualized. Bladder: Appears  normal for degree of bladder distention. Bilateral ureteral jets are visualized. Other: None. IMPRESSION: 1. Bilateral renal cysts, mildly increased in size when compared to the prior study. 2. Subcentimeter non-obstructing renal stone within the lower pole of the left kidney. Electronically Signed   By: Virgina Norfolk M.D.   On: 06/17/2020 23:57   DG  CHEST PORT 1 VIEW  Result Date: 06/27/2020 CLINICAL DATA:  Shortness of breath. EXAM: PORTABLE CHEST 1 VIEW COMPARISON:  06/24/2020.  CT abdomen 06/19/2014. FINDINGS: Stable cardiomegaly. Progressive diffuse severe bilateral pulmonary infiltrates/edema. No prominent pleural effusion. No pneumothorax. Stable calcific densities noted over the right upper quadrant, most likely dystrophic calcification. Degenerative change thoracic spine and both shoulders. IMPRESSION: 1. Stable cardiomegaly. 2. Progressive diffuse severe bilateral pulmonary infiltrates/edema. Electronically Signed   By: Marcello Moores  Register   On: 06/27/2020 05:56   DG Chest Port 1 View  Result Date: 06/24/2020 CLINICAL DATA:  Shortness of breath. EXAM: PORTABLE CHEST 1 VIEW COMPARISON:  Dec 12, 2019. FINDINGS: Stable cardiomediastinal silhouette. No pneumothorax or pleural effusion is noted. Bilateral patchy airspace opacities are now noted concerning for multifocal pneumonia. Bony thorax is unremarkable IMPRESSION: Bilateral patchy airspace opacities are noted concerning for multifocal pneumonia. Electronically Signed   By: Marijo Conception M.D.   On: 06/24/2020 11:37   DG Hip Unilat W or Wo Pelvis 2-3 Views Left  Result Date: 06/06/2020 CLINICAL DATA:  Left hip pain since a fall 06/03/2020. Initial encounter. EXAM: DG HIP (WITH OR WITHOUT PELVIS) 2-3V LEFT COMPARISON:  Plain films left hip 03/23/2020. FINDINGS: There is no acute bony or joint abnormality. Hip joint spaces are preserved. Scattered enthesopathic change noted. IMPRESSION: No acute abnormality. Electronically Signed   By: Inge Rise M.D.   On: 06/06/2020 12:18   US Abdomen Limited RUQ (LIVER/GB)  Result Date: 06/25/2020 CLINICAL DATA:  Elevated LFTs, post cholecystectomy EXAM: ULTRASOUND ABDOMEN LIMITED RIGHT UPPER QUADRANT COMPARISON:  September of 2018 CT evaluation FINDINGS: Gallbladder: Post cholecystectomy. Common bile duct: Diameter: 4.2 mm Liver: Mildly increased echogenicity of hepatic parenchyma without visible lesion on limited images. Portal vein is patent on color Doppler imaging with normal direction of blood flow towards the liver. Other: RIGHT pleural effusion, may be moderate. IMPRESSION: 1. Hepatic steatosis, mild. 2. No biliary duct dilation post cholecystectomy. 3. RIGHT-sided pleural effusion partially imaged. Electronically Signed   By: Zetta Bills M.D.   On: 06/25/2020 15:07    Broadus Costilla Denton Brick, MD  [facility]  If 7PM-7AM, please contact night-coverage [email or web address] Password [email or web address] 06/29/2020, 8:43 AM   LOS: 5 days

## 2020-06-30 ENCOUNTER — Inpatient Hospital Stay (HOSPITAL_COMMUNITY): Payer: Medicare HMO

## 2020-06-30 LAB — BASIC METABOLIC PANEL
Anion gap: 11 (ref 5–15)
BUN: 36 mg/dL — ABNORMAL HIGH (ref 8–23)
CO2: 20 mmol/L — ABNORMAL LOW (ref 22–32)
Calcium: 7.8 mg/dL — ABNORMAL LOW (ref 8.9–10.3)
Chloride: 99 mmol/L (ref 98–111)
Creatinine, Ser: 1.68 mg/dL — ABNORMAL HIGH (ref 0.61–1.24)
GFR, Estimated: 42 mL/min — ABNORMAL LOW (ref >60)
Glucose, Bld: 366 mg/dL — ABNORMAL HIGH (ref 70–99)
Potassium: 3.4 mmol/L — ABNORMAL LOW (ref 3.5–5.1)
Sodium: 130 mmol/L — ABNORMAL LOW (ref 135–145)

## 2020-06-30 LAB — RENAL FUNCTION PANEL
Albumin: 2.1 g/dL — ABNORMAL LOW (ref 3.5–5.0)
Anion gap: 11 (ref 5–15)
BUN: 36 mg/dL — ABNORMAL HIGH (ref 8–23)
CO2: 21 mmol/L — ABNORMAL LOW (ref 22–32)
Calcium: 7.8 mg/dL — ABNORMAL LOW (ref 8.9–10.3)
Chloride: 99 mmol/L (ref 98–111)
Creatinine, Ser: 1.72 mg/dL — ABNORMAL HIGH (ref 0.61–1.24)
GFR, Estimated: 41 mL/min — ABNORMAL LOW (ref >60)
Glucose, Bld: 364 mg/dL — ABNORMAL HIGH (ref 70–99)
Phosphorus: 3.5 mg/dL (ref 2.5–4.6)
Potassium: 3.4 mmol/L — ABNORMAL LOW (ref 3.5–5.1)
Sodium: 131 mmol/L — ABNORMAL LOW (ref 135–145)

## 2020-06-30 LAB — GLUCOSE, CAPILLARY
Glucose-Capillary: 256 mg/dL — ABNORMAL HIGH (ref 70–99)
Glucose-Capillary: 321 mg/dL — ABNORMAL HIGH (ref 70–99)
Glucose-Capillary: 388 mg/dL — ABNORMAL HIGH (ref 70–99)
Glucose-Capillary: 402 mg/dL — ABNORMAL HIGH (ref 70–99)
Glucose-Capillary: 408 mg/dL — ABNORMAL HIGH (ref 70–99)
Glucose-Capillary: 408 mg/dL — ABNORMAL HIGH (ref 70–99)

## 2020-06-30 LAB — CBC
HCT: 25.7 % — ABNORMAL LOW (ref 39.0–52.0)
Hemoglobin: 8.2 g/dL — ABNORMAL LOW (ref 13.0–17.0)
MCH: 33.1 pg (ref 26.0–34.0)
MCHC: 31.9 g/dL (ref 30.0–36.0)
MCV: 103.6 fL — ABNORMAL HIGH (ref 80.0–100.0)
Platelets: 148 10*3/uL — ABNORMAL LOW (ref 150–400)
RBC: 2.48 MIL/uL — ABNORMAL LOW (ref 4.22–5.81)
RDW: 21 % — ABNORMAL HIGH (ref 11.5–15.5)
WBC: 9.5 10*3/uL (ref 4.0–10.5)
nRBC: 0 % (ref 0.0–0.2)

## 2020-06-30 LAB — MAGNESIUM: Magnesium: 1.9 mg/dL (ref 1.7–2.4)

## 2020-06-30 MED ORDER — POTASSIUM CHLORIDE CRYS ER 20 MEQ PO TBCR
40.0000 meq | EXTENDED_RELEASE_TABLET | ORAL | Status: AC
  Administered 2020-06-30 (×2): 40 meq via ORAL
  Filled 2020-06-30: qty 2

## 2020-06-30 NOTE — Progress Notes (Signed)
Removed bipap at [phone]

## 2020-06-30 NOTE — Plan of Care (Signed)
Discussed with patient plan of care for the evening, pain management and medications affecting his blood glucose levels with some teach back displayed.  Also, encouraged to not swallow mucus when coughing up sputum and shown how to use yankauer.  Problem: Education: Goal: Knowledge of General Education information will improve Description: Including pain rating scale, medication(s)/side effects and non-pharmacologic comfort measures Outcome: Progressing

## 2020-06-30 NOTE — Progress Notes (Signed)
PROGRESS NOTE  Jack Mejia LKG:[id number] DOB: Feb 24, 1946 DOA: 06/24/2020 PCP: Erven Colla, DO  Brief History:  74 y.o.year old malewith a history of paroxysmal atrial fibrillation on apixaban, CKD stage III, hypertension, diastolic CHF, hyperlipidemia, diabetes mellitus type 2,macrocytic anemia following with hematology s/p iron injection and receiving Retacrit, and adenomatous colon polyps in 2011who was admitted 06/24/20 with sepsis and acuterespiratory failurein the setting of pneumonia and pulmonary edema withacute on chronic diastolic CHF.  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 Melvyn Novas -Stopped amiodarone -Continue IV Solu-Medrol started 06/28/2020 -requiring up to 15 L of oxygen -Requiring BiPAP at at bedtime -Wean oxygen as tolerated for saturation greater 92% BC x 2  11/29 >>> RESP viral Panel  PCR 11/29 neg MRSA PCR   11/30  Neg Strep urinary ag  11/30 neg  Legionella urinary ag  11/30 >>>neg  -  Acute on chronic diastolic CHF -Volume overload status-worsened with transfusion of PRBC -Improved with IV Bumex --Continue diuresis with IV Bumex 2 mg every 12 hours, apparently patient is intolerant to Lasix -He has been able to tolerate torsemide at home -Daily weights -Accurate I's and O's -05/08/2020 echo EF 65 to 70%, no WMA, grade 1 DD, trivial MR, moderate AAS -BNP 883>>>625 (previous baseline around 300)  DM2- last A1c 8.3 reflecting uncontrolled DM with hyperglycemia -NovoLog sliding scale -Holding  glipizide  CAP/Lobar pneumonia -COVID-19 PCR negative -Continue ceftriaxone and doxycycline -Continue bronchodilators and mucolytics  Severe sepsis -Present on admission -Presented with leukocytosis and tachypnea -Secondary to pneumonia as above -Lactic acid peaked 3.4 -Continue  antibiotics as above  Acute on chronic renal failure CKD stage IIIb -Baseline creatinine 2.0-2.2 -Presented with serum creatinine 2.51 -Monitor closely with diuresis AKI----acute kidney injury on CKD stage - 3B--due to sepsis and hypoperfusion -  creatinine on admission=2.51  , baseline creatinine =2.0     , creatinine is now=1.68 ----renally adjust medications, avoid nephrotoxic agents / dehydration  / hypotension   Hematochezia/ABLA -GI consult -baseline Hgb 9-10 -presented with Hgb 7.5 -Received a total of 2 units of PRBC this admission -Hgb 8.9 -GI consult appreciated  Paroxysmal atrial fibrillation -holding apixaban due to hematochezia -Continue Cardizem CD Stop amiodarone due to concerns about possible amiodarone induced pneumonitis  Essential hypertension -Anticipate improvement with diuresis -Continue diltiazem CD -Continue metoprolol heart rate  Hyperlipidemia -continue statin  Hypokalemia/Hypomagnesemia--- replace and recheck, electrolytes abnormalities may persist due to high-dose diuretic use  Social/ethics--plan of care, advanced directives and goals of care discussed with patient and patient's 2 daughters [name] and [name] -At this time patient is a partial code specifically pt is a DNI,  but okay for CPR, BiPAP and ACLS drugs  Disposition--- patient may be appropriate for LTAC transfer, TOC looking into it  Status is: Inpatient  Remains inpatient appropriate because:Requiring IV Bumex, very very high flow nasal cannula at 15 L/min   Dispo: The patient is from: Home              Anticipated d/c is to: LTAC              Anticipated d/c date is: 2  days               Patient currently is not medically stable to d/c.   Family Communication: -Discussed with S/o at [phone] Discussed with daughters x 2  Consultants:  Pulm  Code Status:    Code Status: Partial Code  DVT Prophylaxis: SCDs  Procedures: As Listed in Progress Note Above  Antibiotics: Rocephin/doxy  Subjective:  Requires BiPAP at at bedtime, respiratory status remains tenuous -Cough shortness of breath and significant hypoxia persist  Objective: Vitals:   06/30/20 1500 06/30/20 1612 06/30/20 1700 06/30/20 1900  BP: (!) 130/51     Pulse: 64 63 61 63  Resp: (!) 30 (!) 26 (!) 36 (!) 23  Temp:  97.6 F (36.4 C)    TempSrc:  Oral    SpO2: 100% 100% 100% 99%  Weight:      Height:        Intake/Output Summary (Last 24 hours) at 06/30/2020 2009 Last data filed at 06/30/2020 1811 Gross per 24 hour  Intake 720 ml  Output 1700 ml  Net -980 ml   Weight change:  Exam:   General:  Pt is alert, follows commands appropriately, conversational dyspnea  HEENT: No icterus, No thrush, No neck mass, /AT  Nose-- HFNC 12L/min  Cardiovascular: RRR, S1/S2, no rubs, no gallops  Respiratory:  Diminished breath sounds , scattered Rhonchi  Abdomen: Soft/+BS, non tender, non distended, no guarding  Extremities: Good pedal pulses,   NeuroPsych--- generalized weakness without new focal deficits, affect is appropriate  Data Reviewed: I have personally reviewed following labs and imaging studies  Basic Metabolic Panel: Recent Labs  Lab 06/24/20 2319 06/25/20 0208 06/26/20 0400 06/28/20 0531 06/30/20 0308  NA 137 138 141 134* 131*  130*  K 4.1 3.7 2.7* 2.9* 3.4*  3.4*  CL 104 106 110 104 99  99  CO2 19* 19* 19* 19* 21*  20*  GLUCOSE 317* 297* 159* 149* 364*  366*  BUN 46* 45* 28* 26* 36*  36*  CREATININE 2.44* 2.20* 1.57* 1.68* 1.72*  1.68*  CALCIUM 8.1* 8.0* 6.7* 7.6* 7.8*  7.8*  MG 2.6*  --  2.0  --  1.9  PHOS  --   --   --   --  3.5   Liver Function  Tests: Recent Labs  Lab 06/24/20 1402 06/25/20 0208 06/26/20 0400 06/30/20 0308  AST 147* 61* 39  --   ALT 130* 96* 62*  --   ALKPHOS 146* 120 104  --   BILITOT 2.1* 1.8* 1.3*  --   PROT 7.4 6.6 5.4*  --   ALBUMIN 2.9* 2.6* 1.9* 2.1*   No results for input(s): LIPASE, AMYLASE in the last 168 hours. No results for input(s): AMMONIA in the last 168 hours. Coagulation Profile: Recent Labs  Lab 06/24/20 1402  INR 2.7*   CBC: Recent Labs  Lab 06/24/20 1402 06/24/20 1402 06/25/20 0208 06/26/20 0400 06/26/20 2235 06/28/20 0531 06/30/20 0308  WBC 26.2*  --  26.3* 18.9*  --  14.7* 9.5  NEUTROABS 20.2*  --   --   --   --   --   --   HGB 7.5*   < > 7.0* 7.0* 9.5* 8.9* 8.2*  HCT 25.1*   < > 22.8* 23.5* 30.3* 28.9* 25.7*  MCV 121.3*  --  121.3* 118.7*  --  105.1* 103.6*  PLT 239  --  208 165  --  147* 148*   < > = values  in this interval not displayed.   Cardiac Enzymes: No results for input(s): CKTOTAL, CKMB, CKMBINDEX, TROPONINI in the last 168 hours. BNP: Invalid input(s): POCBNP CBG: Recent Labs  Lab 06/30/20 0023 06/30/20 0423 06/30/20 0721 06/30/20 1137 06/30/20 1611  GLUCAP 402* 321* 256* 408* 408*   HbA1C: No results for input(s): HGBA1C in the last 72 hours. Urine analysis:    Component Value Date/Time   COLORURINE COLORLESS (A) 06/06/2020 1151   APPEARANCEUR CLEAR 06/06/2020 1151   LABSPEC 1.013 06/06/2020 1151   PHURINE 5.0 06/06/2020 1151   GLUCOSEU >=500 (A) 06/06/2020 1151   HGBUR MODERATE (A) 06/06/2020 1151   BILIRUBINUR NEGATIVE 06/06/2020 1151   KETONESUR NEGATIVE 06/06/2020 1151   PROTEINUR NEGATIVE 06/06/2020 1151   NITRITE NEGATIVE 06/06/2020 1151   LEUKOCYTESUR NEGATIVE 06/06/2020 1151   Sepsis Labs: @LABRCNTIP (procalcitonin:4,lacticidven:4) ) Recent Results (from the past 240 hour(s))  Resp Panel by RT-PCR (Flu A&B, Covid) Nasopharyngeal Swab     Status: None   Collection Time: 06/24/20 11:43 AM   Specimen: Nasopharyngeal Swab;  Nasopharyngeal(NP) swabs in vial transport medium  Result Value Ref Range Status   SARS Coronavirus 2 by RT PCR NEGATIVE NEGATIVE Final    Comment: (NOTE) SARS-CoV-2 target nucleic acids are NOT DETECTED.  The SARS-CoV-2 RNA is generally detectable in upper respiratory specimens during the acute phase of infection. The lowest concentration of SARS-CoV-2 viral copies this assay can detect is 138 copies/mL. A negative result does not preclude SARS-Cov-2 infection and should not be used as the sole basis for treatment or other patient management decisions. A negative result may occur with  improper specimen collection/handling, submission of specimen other than nasopharyngeal swab, presence of viral mutation(s) within the areas targeted by this assay, and inadequate number of viral copies(<138 copies/mL). A negative result must be combined with clinical observations, patient history, and epidemiological information. The expected result is Negative.  Fact Sheet for Patients:  EntrepreneurPulse.com.au  Fact Sheet for Healthcare Providers:  IncredibleEmployment.be  This test is no t yet approved or cleared by the [place] FDA and  has been authorized for detection and/or diagnosis of SARS-CoV-2 by FDA under an Emergency Use Authorization (EUA). This EUA will remain  in effect (meaning this test can be used) for the duration of the COVID-19 declaration under Section 564(b)(1) of the Act, 21 U.S.C.section 360bbb-3(b)(1), unless the authorization is terminated  or revoked sooner.       Influenza A by PCR NEGATIVE NEGATIVE Final   Influenza B by PCR NEGATIVE NEGATIVE Final    Comment: (NOTE) The Xpert Xpress SARS-CoV-2/FLU/RSV plus assay is intended as an aid in the diagnosis of influenza from Nasopharyngeal swab specimens and should not be used as a sole basis for treatment. Nasal washings and aspirates are unacceptable for Xpert Xpress  SARS-CoV-2/FLU/RSV testing.  Fact Sheet for Patients: EntrepreneurPulse.com.au  Fact Sheet for Healthcare Providers: IncredibleEmployment.be  This test is not yet approved or cleared by the [place] FDA and has been authorized for detection and/or diagnosis of SARS-CoV-2 by FDA under an Emergency Use Authorization (EUA). This EUA will remain in effect (meaning this test can be used) for the duration of the COVID-19 declaration under Section 564(b)(1) of the Act, 21 U.S.C. section 360bbb-3(b)(1), unless the authorization is terminated or revoked.  Performed at [facility], [address] [place], [place] [address]   Blood culture (routine x 2)     Status: None   Collection Time: 06/24/20  2:04 PM   Specimen: Left Antecubital;  Blood  Result Value Ref Range Status   Specimen Description   Final    LEFT ANTECUBITAL BOTTLES DRAWN AEROBIC AND ANAEROBIC   Special Requests Blood Culture adequate volume  Final   Culture   Final    NO GROWTH 5 DAYS Performed at [facility], [address], [place], [place] [address]    Report Status 06/29/2020 FINAL  Final  Blood culture (routine x 2)     Status: None   Collection Time: 06/24/20  2:04 PM   Specimen: BLOOD LEFT HAND  Result Value Ref Range Status   Specimen Description   Final    BLOOD LEFT HAND BOTTLES DRAWN AEROBIC AND ANAEROBIC   Special Requests Blood Culture adequate volume  Final   Culture   Final    NO GROWTH 5 DAYS Performed at [facility], [address], [place], [place] [address]    Report Status 06/29/2020 FINAL  Final  MRSA PCR Screening     Status: None   Collection Time: 06/25/20  2:36 PM   Specimen: Nasopharyngeal  Result Value Ref Range Status   MRSA by PCR NEGATIVE NEGATIVE Final    Comment:        The GeneXpert MRSA Assay (FDA approved for NASAL specimens only), is one component of a comprehensive MRSA colonization surveillance program. It is not intended to  diagnose MRSA infection nor to guide or monitor treatment for MRSA infections. Performed at [facility], [address], [place], [place] [address]      Scheduled Meds: . sodium chloride   Intravenous Once  . atorvastatin  20 mg Oral Daily  . bumetanide (BUMEX) IV  2 mg Intravenous BID  . Chlorhexidine Gluconate Cloth  6 each Topical Daily  . diltiazem  360 mg Oral Daily  . feeding supplement (GLUCERNA SHAKE)  237 mL Oral TID  . hydrocortisone   Rectal BID  . insulin aspart  0-15 Units Subcutaneous Q4H  . insulin detemir  10 Units Subcutaneous Daily  . mouth rinse  15 mL Mouth Rinse BID  . methylPREDNISolone (SOLU-MEDROL) injection  80 mg Intravenous Q12H  . metoprolol tartrate  25 mg Oral BID  . pantoprazole  40 mg Oral BID AC   Continuous Infusions:   Procedures/Studies: DG Elbow Complete Left  Result Date: 06/06/2020 CLINICAL DATA:  Pain following fall EXAM: LEFT ELBOW - COMPLETE 3+ VIEW COMPARISON:  None. FINDINGS: Frontal, lateral, and bilateral oblique views were obtained. No fracture, dislocation, or joint effusion. There is spurring along the medial distal humeral condyle. No appreciable joint space narrowing or erosion. IMPRESSION: Spurring along the medial distal humeral condyle. No appreciable joint space narrowing. No fracture or dislocation. Electronically Signed   By: Lowella Grip III M.D.   On: 06/06/2020 12:18   US RENAL  Result Date: 06/17/2020 CLINICAL DATA:  Chronic renal disease stage IV EXAM: RENAL / URINARY TRACT ULTRASOUND COMPLETE COMPARISON:  April 21, 2019 FINDINGS: Right Kidney: Renal measurements: 10.7 cm x 5.2 cm x 5.5 cm = volume: 162.9 mL. Echogenicity within normal limits. A 3.1 cm x 3.3 cm x 2.7 cm complex anechoic structure is seen within the mid right kidney. No hydronephrosis is visualized. Left Kidney: Renal measurements: 12.3 cm x 7.2 cm x 6.1 cm = volume: 283.3 mL. Echogenicity within normal limits. 2.1 cm x 2.1 cm x 2.3 cm and 3.0 cm  x 2.1 cm x 2.4 cm anechoic structures are seen within the mid and lower left kidney. An 8.5 mm shadowing echogenic  focus is also seen within the lower pole of the left kidney. No hydronephrosis is visualized. Bladder: Appears normal for degree of bladder distention. Bilateral ureteral jets are visualized. Other: None. IMPRESSION: 1. Bilateral renal cysts, mildly increased in size when compared to the prior study. 2. Subcentimeter non-obstructing renal stone within the lower pole of the left kidney. Electronically Signed   By: Virgina Norfolk M.D.   On: 06/17/2020 23:57   DG CHEST PORT 1 VIEW  Result Date: 06/30/2020 CLINICAL DATA:  Sepsis, acute respiratory failure, pneumonia and pulmonary edema, acute on chronic CHF. EXAM: PORTABLE CHEST 1 VIEW COMPARISON:  Chest x-ray dated 06/27/2020 FINDINGS: Persistent bilateral airspace opacities, but slightly improved aeration of both lungs. No pleural effusion or pneumothorax is seen. Heart size and mediastinal contours are grossly stable. IMPRESSION: Slightly improved aeration of both lungs, but with persistent bilateral airspace opacities, compatible with multifocal pneumonia versus pulmonary edema. Electronically Signed   By: Franki Cabot M.D.   On: 06/30/2020 05:49   DG CHEST PORT 1 VIEW  Result Date: 06/27/2020 CLINICAL DATA:  Shortness of breath. EXAM: PORTABLE CHEST 1 VIEW COMPARISON:  06/24/2020.  CT abdomen 06/19/2014. FINDINGS: Stable cardiomegaly. Progressive diffuse severe bilateral pulmonary infiltrates/edema. No prominent pleural effusion. No pneumothorax. Stable calcific densities noted over the right upper quadrant, most likely dystrophic calcification. Degenerative change thoracic spine and both shoulders. IMPRESSION: 1. Stable cardiomegaly. 2. Progressive diffuse severe bilateral pulmonary infiltrates/edema. Electronically Signed   By: Marcello Moores  Register   On: 06/27/2020 05:56   DG Chest Port 1 View  Result Date: 06/24/2020 CLINICAL DATA:   Shortness of breath. EXAM: PORTABLE CHEST 1 VIEW COMPARISON:  Dec 12, 2019. FINDINGS: Stable cardiomediastinal silhouette. No pneumothorax or pleural effusion is noted. Bilateral patchy airspace opacities are now noted concerning for multifocal pneumonia. Bony thorax is unremarkable IMPRESSION: Bilateral patchy airspace opacities are noted concerning for multifocal pneumonia. Electronically Signed   By: Marijo Conception M.D.   On: 06/24/2020 11:37   DG Hip Unilat W or Wo Pelvis 2-3 Views Left  Result Date: 06/06/2020 CLINICAL DATA:  Left hip pain since a fall 06/03/2020. Initial encounter. EXAM: DG HIP (WITH OR WITHOUT PELVIS) 2-3V LEFT COMPARISON:  Plain films left hip 03/23/2020. FINDINGS: There is no acute bony or joint abnormality. Hip joint spaces are preserved. Scattered enthesopathic change noted. IMPRESSION: No acute abnormality. Electronically Signed   By: Inge Rise M.D.   On: 06/06/2020 12:18   US Abdomen Limited RUQ (LIVER/GB)  Result Date: 06/25/2020 CLINICAL DATA:  Elevated LFTs, post cholecystectomy EXAM: ULTRASOUND ABDOMEN LIMITED RIGHT UPPER QUADRANT COMPARISON:  September of 2018 CT evaluation FINDINGS: Gallbladder: Post cholecystectomy. Common bile duct: Diameter: 4.2 mm Liver: Mildly increased echogenicity of hepatic parenchyma without visible lesion on limited images. Portal vein is patent on color Doppler imaging with normal direction of blood flow towards the liver. Other: RIGHT pleural effusion, may be moderate. IMPRESSION: 1. Hepatic steatosis, mild. 2. No biliary duct dilation post cholecystectomy. 3. RIGHT-sided pleural effusion partially imaged. Electronically Signed   By: Zetta Bills M.D.   On: 06/25/2020 15:07    Cherica Heiden Denton Brick, MD  [facility]  If 7PM-7AM, please contact night-coverage [email or web address] Password [email or web address] 06/30/2020, 8:09 PM   LOS: 6 days

## 2020-07-01 ENCOUNTER — Telehealth: Payer: Self-pay | Admitting: Gastroenterology

## 2020-07-01 DIAGNOSIS — J189 Pneumonia, unspecified organism: Secondary | ICD-10-CM | POA: Diagnosis not present

## 2020-07-01 DIAGNOSIS — J984 Other disorders of lung: Secondary | ICD-10-CM

## 2020-07-01 DIAGNOSIS — J9601 Acute respiratory failure with hypoxia: Secondary | ICD-10-CM | POA: Diagnosis not present

## 2020-07-01 LAB — GLUCOSE, CAPILLARY
Glucose-Capillary: 228 mg/dL — ABNORMAL HIGH (ref 70–99)
Glucose-Capillary: 282 mg/dL — ABNORMAL HIGH (ref 70–99)
Glucose-Capillary: 318 mg/dL — ABNORMAL HIGH (ref 70–99)
Glucose-Capillary: 327 mg/dL — ABNORMAL HIGH (ref 70–99)
Glucose-Capillary: 366 mg/dL — ABNORMAL HIGH (ref 70–99)
Glucose-Capillary: 371 mg/dL — ABNORMAL HIGH (ref 70–99)
Glucose-Capillary: 411 mg/dL — ABNORMAL HIGH (ref 70–99)
Glucose-Capillary: 426 mg/dL — ABNORMAL HIGH (ref 70–99)

## 2020-07-01 MED ORDER — BUMETANIDE 0.25 MG/ML IJ SOLN
1.0000 mg | Freq: Two times a day (BID) | INTRAMUSCULAR | Status: DC
  Administered 2020-07-02: 1 mg via INTRAVENOUS
  Filled 2020-07-01 (×3): qty 4

## 2020-07-01 MED ORDER — INSULIN ASPART 100 UNIT/ML ~~LOC~~ SOLN
0.0000 [IU] | Freq: Three times a day (TID) | SUBCUTANEOUS | Status: DC
  Administered 2020-07-01 – 2020-07-02 (×3): 20 [IU] via SUBCUTANEOUS
  Administered 2020-07-02: 7 [IU] via SUBCUTANEOUS
  Administered 2020-07-03: 15 [IU] via SUBCUTANEOUS
  Administered 2020-07-03: 20 [IU] via SUBCUTANEOUS
  Administered 2020-07-04: 15 [IU] via SUBCUTANEOUS
  Administered 2020-07-04: 11 [IU] via SUBCUTANEOUS
  Administered 2020-07-04: 7 [IU] via SUBCUTANEOUS
  Administered 2020-07-05: 11 [IU] via SUBCUTANEOUS
  Administered 2020-07-06: 7 [IU] via SUBCUTANEOUS
  Administered 2020-07-06: 15 [IU] via SUBCUTANEOUS
  Administered 2020-07-07: 11 [IU] via SUBCUTANEOUS
  Administered 2020-07-07: 3 [IU] via SUBCUTANEOUS
  Administered 2020-07-07 – 2020-07-08 (×2): 4 [IU] via SUBCUTANEOUS
  Administered 2020-07-08: 7 [IU] via SUBCUTANEOUS
  Administered 2020-07-09: 4 [IU] via SUBCUTANEOUS
  Administered 2020-07-09: 15 [IU] via SUBCUTANEOUS
  Administered 2020-07-09 – 2020-07-10 (×2): 4 [IU] via SUBCUTANEOUS
  Administered 2020-07-10: 7 [IU] via SUBCUTANEOUS

## 2020-07-01 MED ORDER — INSULIN ASPART 100 UNIT/ML ~~LOC~~ SOLN: 0.0000 [IU] | Freq: Every day | SUBCUTANEOUS | Status: DC

## 2020-07-01 MED ORDER — INSULIN ASPART 100 UNIT/ML ~~LOC~~ SOLN
0.0000 [IU] | Freq: Every day | SUBCUTANEOUS | Status: DC
  Administered 2020-07-02: 5 [IU] via SUBCUTANEOUS
  Administered 2020-07-03: 3 [IU] via SUBCUTANEOUS
  Administered 2020-07-04: 4 [IU] via SUBCUTANEOUS
  Administered 2020-07-05 – 2020-07-08 (×3): 3 [IU] via SUBCUTANEOUS

## 2020-07-01 MED ORDER — INSULIN ASPART 100 UNIT/ML ~~LOC~~ SOLN: 0.0000 [IU] | Freq: Three times a day (TID) | SUBCUTANEOUS | Status: DC

## 2020-07-01 NOTE — Progress Notes (Signed)
NAME:  Jack Mejia, MRN:  [id number], DOB:  [birth year], LOS: 7 ADMISSION DATE:  06/24/2020, CONSULTATION DATE:  06/27/2020 REFERRING MD:  Dr. Denton Brick, [facility] COMPLAINT:  Hypoxia   Brief History   74 yo male with remote hx of smoking and hx of A fib on amiodarone presented with progressive dyspnea for 6 weeks prior to admission.  Found to have pulmonary infiltrates and hypoxia (SpO2 82% on room air).  Didn't improve with treatment for pneumonia or heart failure.  He had recent admission for hyperglycemia.  Past Medical History  OA, A fib, CAD, Depression, DM type 2, Fatty liver, Nephrolithiasis, HLD, HLD, CKD, Vertigo  Significant Hospital Events   11/29 admit, start ABx, start on Bipap/HFNC 12/02 increased ESR (130), amiodarone stopped 12/03 solumedrol started  Consults:  GI 11/30 Palliative care 12/02   Procedures:     Significant Diagnostic Tests:  Echo 05/08/20 >> EF 65 to 70%, mild LVH, grade 1 DD, RVSP 36.4 mmHg, mod AS  Micro Data:  COVID/Flu 11/29 >> negative  Blood 11/29 >> negative Legionella Ag 11/30 > negative Pneumococcal Ag 11/30 >> negative MRSA PCR 11/30 >> negative  Antimicrobials:  Vancomycin 11/29 Cefepime 11/29 Doxycyline 11/29 >> 12/02 Rocephin 11/29 >> 12/03  Interim history/subjective:  He still feels weak.  Breathing better.  Not having much cough.  Not having chest pain.  Objective   Blood pressure (!) 142/59, pulse 66, temperature (!) 96.7 F (35.9 C), temperature source Axillary, resp. rate 19, height _0  (1.778 m), weight 96.4 kg, SpO2 95 %.        Intake/Output Summary (Last 24 hours) at 07/01/2020 1452 Last data filed at 07/01/2020 1451 Gross per 24 hour  Intake 480 ml  Output 2900 ml  Net -2420 ml   Filed Weights   06/27/20 0409 06/28/20 0439 06/30/20 0400  Weight: 96 kg 95.7 kg 96.4 kg    Examination:  General - alert Eyes - pupils reactive ENT - no sinus tenderness, no stridor Cardiac - regular rate/rhythm,  2/6 SM Chest - equal breath sounds b/l, no wheezing or rales Abdomen - soft, non tender, + bowel sounds Extremities - no cyanosis, clubbing, or edema Skin - no rashes Neuro - normal strength, moves extremities, follows commands Psych - normal mood and behavior  Resolved Hospital Problem list      Assessment & Plan:   Acute hypoxic respiratory failure. - most likely from acute pneumonitis secondary to amiodarone - amiodarone stopped 12/02 and started on solumedrol 12/03; has improved O2 needs since then - continue solumedrol 80 mg q12h - f/u CXR 12/07 - goal SpO2 > 92%  Paroxysmal atrial fibrillation, chronic diastolic CHF, aortic stenosis, HLD. - per primary team  Steroid induced hyperglycemia. - per primary team  Anemia with FOBT positive. - GI consulted; not stable for endoscopy at this time - f/u CBC - transfuse for Hb < 7 or significant bleeding  CKD 3b. - baseline creatinine 2.02 from 06/07/20 - f/u BMET    Labs    CMP Latest Ref Rng & Units 06/30/2020 06/30/2020 06/28/2020  Glucose 70 - 99 mg/dL 364(H) 366(H) 149(H)  BUN 8 - 23 mg/dL 36(H) 36(H) 26(H)  Creatinine 0.61 - 1.24 mg/dL 1.72(H) 1.68(H) 1.68(H)  Sodium 135 - 145 mmol/L 131(L) 130(L) 134(L)  Potassium 3.5 - 5.1 mmol/L 3.4(L) 3.4(L) 2.9(L)  Chloride 98 - 111 mmol/L 99 99 104  CO2 22 - 32 mmol/L 21(L) 20(L) 19(L)  Calcium 8.9 - 10.3 mg/dL 7.8(L) 7.8(L)  7.6(L)  Total Protein 6.5 - 8.1 g/dL - - -  Total Bilirubin 0.3 - 1.2 mg/dL - - -  Alkaline Phos 38 - 126 U/L - - -  AST 15 - 41 U/L - - -  ALT 0 - 44 U/L - - -    CBC Latest Ref Rng & Units 06/30/2020 06/28/2020 06/26/2020  WBC 4.0 - 10.5 K/uL 9.5 14.7(H) -  Hemoglobin 13.0 - 17.0 g/dL 8.2(L) 8.9(L) 9.5(L)  Hematocrit 39 - 52 % 25.7(L) 28.9(L) 30.3(L)  Platelets 150 - 400 K/uL 148(L) 147(L) -    ABG    Component Value Date/Time   PHART 7.484 (H) 06/24/2020 1224   PCO2ART 28.6 (L) 06/24/2020 1224   PO2ART 94.7 06/24/2020 1224   HCO3 23.2  06/24/2020 1224   TCO2 21 (L) 05/23/2019 1021   ACIDBASEDEF 1.7 06/24/2020 1224   O2SAT 98.0 06/24/2020 1224    CBG (last 3)  Recent Labs    07/01/20 0455 07/01/20 0748 07/01/20 1107  GLUCAP 282* 228* 371*    Lab Results  Component Value Date   ESRSEDRATE 130 (H) 06/27/2020    Signature:  Chesley Mires, MD [place] Pager - [phone] 07/01/2020, 3:14 PM

## 2020-07-01 NOTE — Progress Notes (Addendum)
Inpatient Diabetes Program Recommendations  AACE/ADA: New Consensus Statement on Inpatient Glycemic Control   Target Ranges:  Prepandial:   less than 140 mg/dL      Peak postprandial:   less than 180 mg/dL (1-2 hours)      Critically ill patients:  140 - 180 mg/dL   Results for Jack Mejia, Jack Mejia (MRN [id number]) as of 07/01/2020 11:58  Ref. Range 06/30/2020 07:21 06/30/2020 10:20 06/30/2020 11:37 06/30/2020 16:11 06/30/2020 20:37 07/01/2020 01:08 07/01/2020 04:55 07/01/2020 07:48 07/01/2020 10:33 07/01/2020 11:07  Glucose-Capillary Latest Ref Range: 70 - 99 mg/dL 256 (H)  Novolog 8 units     Levemir 10 units 408 (H)  Novolog 15 units 408 (H)  Novolog 15 units 388 (H)  Novolog 15 units 327 (H)  Novolog 15 units 282 (H)  Novolog 8 units 228 (H)  Novolog 5 units     Levemir 10 units 371 (H)  Novolog 15 units   Review of Glycemic Control  Diabetes history:DM2 Outpatient Diabetes medications:Jardiance 10 mg QAM, Glipizide 10 mg BID Current orders for Inpatient glycemic control: Levemir 10 units daily, Novolog 0-15 units Q4H; Solumedrol 80 mg Q12H  Inpatient Diabetes Program Recommendations:    Insulin: If steroids continued as ordered, please consider increasing Levemir to 15 units BID (to start at bedtime today) and order Novolog 3 units TID with meals for meal coverage if patient eats at least 50% of meals.  Thanks, Barnie Alderman, RN, MSN, CDE Diabetes Coordinator Inpatient Diabetes Program [phone] (Team Pager from 8am to 5pm)

## 2020-07-01 NOTE — TOC Progression Note (Signed)
Transition of Care [facility]) - Progression Note    Patient Details  Name: KJELL BRANNEN MRN: [id number] Date of Birth: 1945-09-01  Transition of [facility]) CM/SW Contact  Shade Flood, LCSW Phone Number: 07/01/2020, 2:34 PM  Clinical Narrative:     TOC following. Pt status reviewed with MD in progression this AM. Pt down to 4L O2 at this time. Unless pt's O2 needs increase, LTAC referral will not be appropriate. MD is anticipating pt will need SNF rehab at dc. Pt will require insurance auth for SNF once PT eval is complete and if SNF is the recommendation. TOC will follow and continue to assess and assist with dc planning.  Expected Discharge Plan: [place] Barriers to Discharge: Continued Medical Work up  Expected Discharge Plan and Services Expected Discharge Plan: [place] In-house Referral: Clinical Social Work Discharge Planning Services: CM Consult   Living arrangements for the past 2 months: Single Family Home                 DME Arranged: N/A DME Agency: NA                   Social Determinants of Health (SDOH) Interventions    Readmission Risk Interventions No flowsheet data found.

## 2020-07-01 NOTE — Progress Notes (Signed)
Chart reviewed today. Patient not physically seen. Pursue endoscopic evaluation as outpatient at this time. Will arrange follow-up with Dr. Laural Golden.

## 2020-07-01 NOTE — Progress Notes (Addendum)
PROGRESS NOTE  Jack Mejia UXN:[id number] DOB: [birth year] DOA: 06/24/2020 PCP: Erven Colla, DO  Brief History:  74 y.o.year old malewith a history of paroxysmal atrial fibrillation on apixaban, CKD stage III, hypertension, diastolic CHF, hyperlipidemia, diabetes mellitus type 2,macrocytic anemia following with hematology s/p iron injection and receiving Retacrit, and adenomatous colon polyps in 2011who was admitted 06/24/20 with sepsis and acuterespiratory failurein the setting of pneumonia and pulmonary edema withacute on chronic diastolic CHF.  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 Melvyn Novas -Stopped amiodarone -Continue IV Solu-Medrol started 06/28/2020 -Oxygen has been weaned down to less than 4 L/min No longer requiring BiPAP -Wean oxygen as tolerated for saturation greater 92% BC x 2  11/29 >>> RESP viral Panel  PCR 11/29 neg MRSA PCR   11/30  Neg Strep urinary ag  11/30 neg  Legionella urinary ag  11/30 >>>neg  -Overall significant improvement since starting steroids on 06/28/2020 -Repeat chest x-ray on 07/02/2020 -Repeat ESR in the a.m.  Acute on chronic diastolic CHF -Volume overload status-worsened with transfusion of PRBC -Improved with IV Bumex We will decrease IV Bumex to 1  mg every 12 hours, apparently patient is intolerant to Lasix -He has been able to tolerate torsemide at home -Daily weights -Accurate I's and O's -05/08/2020 echo EF 65 to 70%, no WMA, grade 1 DD, trivial MR, moderate AAS -BNP  883>>>625 (previous baseline around 300) -Repeat BNP in a.m.  DM2- last A1c 8.3 reflecting uncontrolled DM with hyperglycemia -NovoLog sliding scale -Holding glipizide  CAP/Lobar pneumonia -COVID-19 PCR negative -Continue ceftriaxone and doxycycline -Continue bronchodilators and mucolytics  Severe sepsis -Present on admission -Presented with leukocytosis and tachypnea -Secondary to pneumonia as above -Lactic acid peaked 3.4 -Continue  antibiotics as above -Sepsis pathophysiology appears to be resolving  Acute on chronic renal failure CKD stage IIIb -Baseline creatinine 2.0-2.2 -Presented with serum creatinine 2.51 -Monitor closely with diuresis AKI----acute kidney injury on CKD stage - 3B--due to sepsis and hypoperfusion -  creatinine on admission=2.51  , baseline creatinine =2.0     , creatinine is now=1.68 ----renally adjust medications, avoid nephrotoxic agents / dehydration  / hypotension   Hematochezia/ABLA -GI consult -baseline Hgb 9-10 -presented with Hgb 7.5 -Received a total of 2 units of PRBC this admission -GI consult appreciated -Patient's cardiopulmonary  status precludes endoluminal evaluation -Advance to GI soft diet  Paroxysmal atrial fibrillation -holding apixaban due to hematochezia -Continue Cardizem CD Stopped amiodarone due to concerns about possible amiodarone induced pneumonitis  Essential hypertension -Anticipate improvement with diuresis -Continue diltiazem CD -Continue metoprolol heart rate  Hyperlipidemia -continue statin  Hypokalemia/Hypomagnesemia--- replace and recheck, electrolytes abnormalities may persist due to high-dose diuretic use  Social/ethics--plan of care, advanced directives and goals of care discussed with patient and patient's 2 daughters [name] and [name] -At this time patient is a partial code specifically pt is a DNI,  but okay for CPR, BiPAP and ACLS drugs  Disposition--- consider PT eval when respiratory  status improves further may need SNF rehab   Status is: Inpatient  Remains inpatient appropriate because:Acute hypoxic respiratory failure requiring IV diuretic and IV steroids  Dispo: The patient is from: Home              Anticipated d/c is to: LTAC              Anticipated d/c date is: 2 days              Patient currently is not medically stable to d/c.   Family Communication: -Discussed with S/o at [phone] Discussed with daughters x 2  Consultants:  Pulm  Code Status:    Code Status: Partial Code  DVT Prophylaxis: SCDs  Procedures: As Listed in Progress Note Above  Antibiotics: Rocephin/doxy  Subjective: Cough, shortness of breath and hypoxia improving -Tolerating oral intake well -Patient wants solid food  Objective: Vitals:   07/01/20 0833 07/01/20 1036 07/01/20 1451 07/01/20 1538  BP: (!) 135/59  (!) 142/59   Pulse: 65  66   Resp:   19   Temp:    97.9 F (36.6 C)  TempSrc:    Axillary  SpO2: 90% 95% 94%   Weight:      Height:        Intake/Output Summary (Last 24 hours) at 07/01/2020 1910 Last data filed at 07/01/2020 1847 Gross per 24 hour  Intake 840 ml  Output 3301 ml  Net -2461 ml   Weight change:  Exam:   General:  Pt is alert, follows commands appropriately,  HEENT: No icterus, No thrush, No neck mass, [place]/AT  Nose-- HFNC 4L/min  Cardiovascular: RRR, S1/S2, no rubs, no gallops  Respiratory:  Diminished breath sounds , scattered Rhonchi  Abdomen: Soft/+BS, non tender, non distended, no guarding  Extremities: Good pedal pulses,   NeuroPsych--- generalized weakness without new focal deficits, affect is appropriate  Data Reviewed: I have personally reviewed following labs and imaging studies  Basic Metabolic Panel: Recent Labs  Lab 06/24/20 2319 06/25/20 0208 06/26/20 0400 06/28/20 0531 06/30/20 0308  NA 137 138 141 134* 131*  130*  K 4.1 3.7 2.7* 2.9* 3.4*  3.4*  CL 104 106 110 104 99  99  CO2 19* 19* 19* 19*  21*  20*  GLUCOSE 317* 297* 159* 149* 364*  366*  BUN 46* 45* 28* 26* 36*  36*  CREATININE 2.44* 2.20* 1.57* 1.68* 1.72*  1.68*  CALCIUM 8.1* 8.0* 6.7* 7.6* 7.8*  7.8*  MG 2.6*  --  2.0  --  1.9  PHOS  --   --   --   --  3.5   Liver Function Tests: Recent Labs  Lab 06/25/20 0208 06/26/20 0400 06/30/20 0308  AST 61* 39  --   ALT 96* 62*  --   ALKPHOS 120 104  --   BILITOT 1.8* 1.3*  --   PROT 6.6 5.4*  --   ALBUMIN 2.6* 1.9* 2.1*   No results for input(s): LIPASE, AMYLASE in the last 168 hours. No results for input(s): AMMONIA in the last 168 hours. Coagulation Profile: No results for input(s): INR, PROTIME in the last 168 hours. CBC: Recent Labs  Lab 06/25/20 0208 06/26/20 0400 06/26/20 2235 06/28/20 0531 06/30/20 0308  WBC 26.3* 18.9*  --  14.7* 9.5  HGB 7.0* 7.0* 9.5* 8.9* 8.2*  HCT 22.8* 23.5* 30.3* 28.9* 25.7*  MCV 121.3* 118.7*  --  105.1* 103.6*  PLT 208 165  --  147* 148*   Cardiac Enzymes: No results for input(s): CKTOTAL, CKMB, CKMBINDEX, TROPONINI in the last 168 hours. BNP: Invalid input(s): POCBNP CBG: Recent Labs  Lab 07/01/20 0108  07/01/20 0455 07/01/20 0748 07/01/20 1107 07/01/20 1539  GLUCAP 327* 282* 228* 371* 426*   HbA1C: No results for input(s): HGBA1C in the last 72 hours. Urine analysis:    Component Value Date/Time   COLORURINE COLORLESS (A) 06/06/2020 1151   APPEARANCEUR CLEAR 06/06/2020 1151   LABSPEC 1.013 06/06/2020 1151   PHURINE 5.0 06/06/2020 1151   GLUCOSEU >=500 (A) 06/06/2020 1151   HGBUR MODERATE (A) 06/06/2020 1151   BILIRUBINUR NEGATIVE 06/06/2020 1151   KETONESUR NEGATIVE 06/06/2020 1151   PROTEINUR NEGATIVE 06/06/2020 1151   NITRITE NEGATIVE 06/06/2020 1151   LEUKOCYTESUR NEGATIVE 06/06/2020 1151   Sepsis Labs: $[email or web address]$ (procalcitonin:4,lacticidven:4) ) Recent Results (from the past 240 hour(s))  Resp Panel by RT-PCR (Flu A&B, Covid) Nasopharyngeal Swab     Status: None   Collection Time: 06/24/20  11:43 AM   Specimen: Nasopharyngeal Swab; Nasopharyngeal(NP) swabs in vial transport medium  Result Value Ref Range Status   SARS Coronavirus 2 by RT PCR NEGATIVE NEGATIVE Final    Comment: (NOTE) SARS-CoV-2 target nucleic acids are NOT DETECTED.  The SARS-CoV-2 RNA is generally detectable in upper respiratory specimens during the acute phase of infection. The lowest concentration of SARS-CoV-2 viral copies this assay can detect is 138 copies/mL. A negative result does not preclude SARS-Cov-2 infection and should not be used as the sole basis for treatment or other patient management decisions. A negative result may occur with  improper specimen collection/handling, submission of specimen other than nasopharyngeal swab, presence of viral mutation(s) within the areas targeted by this assay, and inadequate number of viral copies(<138 copies/mL). A negative result must be combined with clinical observations, patient history, and epidemiological information. The expected result is Negative.  Fact Sheet for Patients:  EntrepreneurPulse.com.au  Fact Sheet for Healthcare Providers:  IncredibleEmployment.be  This test is no t yet approved or cleared by the [place] FDA and  has been authorized for detection and/or diagnosis of SARS-CoV-2 by FDA under an Emergency Use Authorization (EUA). This EUA will remain  in effect (meaning this test can be used) for the duration of the COVID-19 declaration under Section 564(b)(1) of the Act, 21 U.S.C.section 360bbb-3(b)(1), unless the authorization is terminated  or revoked sooner.       Influenza A by PCR NEGATIVE NEGATIVE Final   Influenza B by PCR NEGATIVE NEGATIVE Final    Comment: (NOTE) The Xpert Xpress SARS-CoV-2/FLU/RSV plus assay is intended as an aid in the diagnosis of influenza from Nasopharyngeal swab specimens and should not be used as a sole basis for treatment. Nasal washings and aspirates  are unacceptable for Xpert Xpress SARS-CoV-2/FLU/RSV testing.  Fact Sheet for Patients: EntrepreneurPulse.com.au  Fact Sheet for Healthcare Providers: IncredibleEmployment.be  This test is not yet approved or cleared by the [place] FDA and has been authorized for detection and/or diagnosis of SARS-CoV-2 by FDA under an Emergency Use Authorization (EUA). This EUA will remain in effect (meaning this test can be used) for the duration of the COVID-19 declaration under Section 564(b)(1) of the Act, 21 U.S.C. section 360bbb-3(b)(1), unless the authorization is terminated or revoked.  Performed at [facility], [address], [place], [place] [address]   Blood culture (routine x 2)     Status: None   Collection Time: 06/24/20  2:04 PM   Specimen: Left Antecubital; Blood  Result Value Ref Range Status   Specimen Description   Final    LEFT ANTECUBITAL BOTTLES DRAWN AEROBIC AND ANAEROBIC   Special Requests Blood Culture adequate volume  Final   Culture   Final    NO GROWTH 5 DAYS Performed at [facility], [address] [place], [place] [address]    Report Status 06/29/2020 FINAL  Final  Blood culture (routine x 2)     Status: None   Collection Time: 06/24/20  2:04 PM   Specimen: BLOOD LEFT HAND  Result Value Ref Range Status   Specimen Description   Final    BLOOD LEFT HAND BOTTLES DRAWN AEROBIC AND ANAEROBIC   Special Requests Blood Culture adequate volume  Final   Culture   Final    NO GROWTH 5 DAYS Performed at [facility], [address], [place], [place] [address]    Report Status 06/29/2020 FINAL  Final  MRSA PCR Screening     Status: None   Collection Time: 06/25/20  2:36 PM   Specimen: Nasopharyngeal  Result Value Ref Range Status   MRSA by PCR NEGATIVE NEGATIVE Final    Comment:        The GeneXpert MRSA Assay (FDA approved for NASAL specimens only), is one component of a comprehensive MRSA colonization surveillance  program. It is not intended to diagnose MRSA infection nor to guide or monitor treatment for MRSA infections. Performed at [facility], [address] [place], [place] [address]      Scheduled Meds: . atorvastatin  20 mg Oral Daily  . [START ON 07/02/2020] bumetanide (BUMEX) IV  1 mg Intravenous BID  . Chlorhexidine Gluconate Cloth  6 each Topical Daily  . diltiazem  360 mg Oral Daily  . feeding supplement (GLUCERNA SHAKE)  237 mL Oral TID  . hydrocortisone   Rectal BID  . insulin aspart  0-15 Units Subcutaneous Q4H  . insulin detemir  10 Units Subcutaneous Daily  . mouth rinse  15 mL Mouth Rinse BID  . methylPREDNISolone (SOLU-MEDROL) injection  80 mg Intravenous Q12H  . metoprolol tartrate  25 mg Oral BID  . pantoprazole  40 mg Oral BID AC   Continuous Infusions:   Procedures/Studies: DG Elbow Complete Left  Result Date: 06/06/2020 CLINICAL DATA:  Pain following fall EXAM: LEFT ELBOW - COMPLETE 3+ VIEW COMPARISON:  None. FINDINGS: Frontal, lateral, and bilateral oblique views were obtained. No fracture, dislocation, or joint effusion. There is spurring along the medial distal humeral condyle. No appreciable joint space narrowing or erosion. IMPRESSION: Spurring along the medial distal humeral condyle. No appreciable joint space narrowing. No fracture or dislocation. Electronically Signed   By: Lowella Grip III M.D.   On: 06/06/2020 12:18   US RENAL  Result Date: 06/17/2020 CLINICAL DATA:  Chronic renal disease stage IV EXAM: RENAL / URINARY TRACT ULTRASOUND COMPLETE COMPARISON:  April 21, 2019 FINDINGS: Right Kidney: Renal measurements: 10.7 cm x 5.2 cm x 5.5 cm = volume: 162.9 mL. Echogenicity within normal limits. A 3.1 cm x 3.3 cm x 2.7 cm complex anechoic structure is seen within the mid right kidney. No hydronephrosis is visualized. Left Kidney: Renal measurements: 12.3 cm x 7.2 cm x 6.1 cm = volume: 283.3 mL. Echogenicity within normal limits. 2.1 cm x 2.1 cm x 2.3  cm and 3.0 cm x 2.1 cm x 2.4 cm anechoic structures are seen within the mid and lower left kidney. An 8.5 mm shadowing echogenic focus is also seen within the lower pole of the left kidney. No hydronephrosis is visualized. Bladder: Appears normal for degree of bladder distention. Bilateral ureteral jets are visualized. Other: None. IMPRESSION: 1. Bilateral renal cysts, mildly increased  in size when compared to the prior study. 2. Subcentimeter non-obstructing renal stone within the lower pole of the left kidney. Electronically Signed   By: Virgina Norfolk M.D.   On: 06/17/2020 23:57   DG CHEST PORT 1 VIEW  Result Date: 06/30/2020 CLINICAL DATA:  Sepsis, acute respiratory failure, pneumonia and pulmonary edema, acute on chronic CHF. EXAM: PORTABLE CHEST 1 VIEW COMPARISON:  Chest x-ray dated 06/27/2020 FINDINGS: Persistent bilateral airspace opacities, but slightly improved aeration of both lungs. No pleural effusion or pneumothorax is seen. Heart size and mediastinal contours are grossly stable. IMPRESSION: Slightly improved aeration of both lungs, but with persistent bilateral airspace opacities, compatible with multifocal pneumonia versus pulmonary edema. Electronically Signed   By: Franki Cabot M.D.   On: 06/30/2020 05:49   DG CHEST PORT 1 VIEW  Result Date: 06/27/2020 CLINICAL DATA:  Shortness of breath. EXAM: PORTABLE CHEST 1 VIEW COMPARISON:  06/24/2020.  CT abdomen 06/19/2014. FINDINGS: Stable cardiomegaly. Progressive diffuse severe bilateral pulmonary infiltrates/edema. No prominent pleural effusion. No pneumothorax. Stable calcific densities noted over the right upper quadrant, most likely dystrophic calcification. Degenerative change thoracic spine and both shoulders. IMPRESSION: 1. Stable cardiomegaly. 2. Progressive diffuse severe bilateral pulmonary infiltrates/edema. Electronically Signed   By: Marcello Moores  Register   On: 06/27/2020 05:56   DG Chest Port 1 View  Result Date:  06/24/2020 CLINICAL DATA:  Shortness of breath. EXAM: PORTABLE CHEST 1 VIEW COMPARISON:  Dec 12, 2019. FINDINGS: Stable cardiomediastinal silhouette. No pneumothorax or pleural effusion is noted. Bilateral patchy airspace opacities are now noted concerning for multifocal pneumonia. Bony thorax is unremarkable IMPRESSION: Bilateral patchy airspace opacities are noted concerning for multifocal pneumonia. Electronically Signed   By: Marijo Conception M.D.   On: 06/24/2020 11:37   DG Hip Unilat W or Wo Pelvis 2-3 Views Left  Result Date: 06/06/2020 CLINICAL DATA:  Left hip pain since a fall 06/03/2020. Initial encounter. EXAM: DG HIP (WITH OR WITHOUT PELVIS) 2-3V LEFT COMPARISON:  Plain films left hip 03/23/2020. FINDINGS: There is no acute bony or joint abnormality. Hip joint spaces are preserved. Scattered enthesopathic change noted. IMPRESSION: No acute abnormality. Electronically Signed   By: Inge Rise M.D.   On: 06/06/2020 12:18   US Abdomen Limited RUQ (LIVER/GB)  Result Date: 06/25/2020 CLINICAL DATA:  Elevated LFTs, post cholecystectomy EXAM: ULTRASOUND ABDOMEN LIMITED RIGHT UPPER QUADRANT COMPARISON:  September of 2018 CT evaluation FINDINGS: Gallbladder: Post cholecystectomy. Common bile duct: Diameter: 4.2 mm Liver: Mildly increased echogenicity of hepatic parenchyma without visible lesion on limited images. Portal vein is patent on color Doppler imaging with normal direction of blood flow towards the liver. Other: RIGHT pleural effusion, may be moderate. IMPRESSION: 1. Hepatic steatosis, mild. 2. No biliary duct dilation post cholecystectomy. 3. RIGHT-sided pleural effusion partially imaged. Electronically Signed   By: Zetta Bills M.D.   On: 06/25/2020 15:07    Wise Fees Denton Brick, MD  [facility]  If 7PM-7AM, please contact night-coverage [email or web address] Password [email or web address] 07/01/2020, 7:10 PM   LOS: 7 days

## 2020-07-01 NOTE — Telephone Encounter (Signed)
Jack Mejia, patient needs hospital follow-up once discharged to discuss colonoscopy. Currently inpatient and not a candidate due to respiratory status. Thanks!

## 2020-07-01 NOTE — Telephone Encounter (Signed)
Please review the message from Sharlee Blew provider about this patient.

## 2020-07-01 NOTE — Progress Notes (Signed)
Patient transferred to chair. Tolerated well.   Will wean to 2L HFNC and monitor patient's tolerance.

## 2020-07-01 NOTE — Progress Notes (Addendum)
CRITICAL VALUE ALERT  Critical Value:  BG [id number]  Date & Time Notied:  07/01/20 2010  Provider Notified: Dr. Josephine Cables  Orders Received/Actions taken: Awaiting orders

## 2020-07-02 ENCOUNTER — Inpatient Hospital Stay (HOSPITAL_COMMUNITY): Payer: Medicare HMO

## 2020-07-02 DIAGNOSIS — Z515 Encounter for palliative care: Secondary | ICD-10-CM

## 2020-07-02 DIAGNOSIS — J9601 Acute respiratory failure with hypoxia: Secondary | ICD-10-CM | POA: Diagnosis not present

## 2020-07-02 DIAGNOSIS — J984 Other disorders of lung: Secondary | ICD-10-CM | POA: Diagnosis not present

## 2020-07-02 DIAGNOSIS — T462X5A Adverse effect of other antidysrhythmic drugs, initial encounter: Secondary | ICD-10-CM | POA: Diagnosis not present

## 2020-07-02 DIAGNOSIS — Z7189 Other specified counseling: Secondary | ICD-10-CM

## 2020-07-02 DIAGNOSIS — Z66 Do not resuscitate: Secondary | ICD-10-CM

## 2020-07-02 DIAGNOSIS — L8990 Pressure ulcer of unspecified site, unspecified stage: Secondary | ICD-10-CM | POA: Insufficient documentation

## 2020-07-02 LAB — BASIC METABOLIC PANEL
Anion gap: 9 (ref 5–15)
BUN: 46 mg/dL — ABNORMAL HIGH (ref 8–23)
CO2: 25 mmol/L (ref 22–32)
Calcium: 8.2 mg/dL — ABNORMAL LOW (ref 8.9–10.3)
Chloride: 98 mmol/L (ref 98–111)
Creatinine, Ser: 1.67 mg/dL — ABNORMAL HIGH (ref 0.61–1.24)
GFR, Estimated: 43 mL/min — ABNORMAL LOW (ref >60)
Glucose, Bld: 231 mg/dL — ABNORMAL HIGH (ref 70–99)
Potassium: 3.8 mmol/L (ref 3.5–5.1)
Sodium: 132 mmol/L — ABNORMAL LOW (ref 135–145)

## 2020-07-02 LAB — CBC
HCT: 28.3 % — ABNORMAL LOW (ref 39.0–52.0)
Hemoglobin: 8.7 g/dL — ABNORMAL LOW (ref 13.0–17.0)
MCH: 31.8 pg (ref 26.0–34.0)
MCHC: 30.7 g/dL (ref 30.0–36.0)
MCV: 103.3 fL — ABNORMAL HIGH (ref 80.0–100.0)
Platelets: 172 10*3/uL (ref 150–400)
RBC: 2.74 MIL/uL — ABNORMAL LOW (ref 4.22–5.81)
RDW: 20.3 % — ABNORMAL HIGH (ref 11.5–15.5)
WBC: 16.4 10*3/uL — ABNORMAL HIGH (ref 4.0–10.5)
nRBC: 0 % (ref 0.0–0.2)

## 2020-07-02 LAB — GLUCOSE, CAPILLARY
Glucose-Capillary: 217 mg/dL — ABNORMAL HIGH (ref 70–99)
Glucose-Capillary: 250 mg/dL — ABNORMAL HIGH (ref 70–99)
Glucose-Capillary: 273 mg/dL — ABNORMAL HIGH (ref 70–99)
Glucose-Capillary: 384 mg/dL — ABNORMAL HIGH (ref 70–99)
Glucose-Capillary: 455 mg/dL — ABNORMAL HIGH (ref 70–99)
Glucose-Capillary: 472 mg/dL — ABNORMAL HIGH (ref 70–99)
Glucose-Capillary: 482 mg/dL — ABNORMAL HIGH (ref 70–99)

## 2020-07-02 LAB — BRAIN NATRIURETIC PEPTIDE: B Natriuretic Peptide: 829 pg/mL — ABNORMAL HIGH (ref 0.0–100.0)

## 2020-07-02 LAB — SEDIMENTATION RATE: Sed Rate: 128 mm/hr — ABNORMAL HIGH (ref 0–16)

## 2020-07-02 MED ORDER — BUMETANIDE 0.25 MG/ML IJ SOLN
1.0000 mg | Freq: Every day | INTRAMUSCULAR | Status: DC
  Administered 2020-07-03: 1 mg via INTRAVENOUS
  Filled 2020-07-02 (×3): qty 4

## 2020-07-02 MED ORDER — PREDNISONE 20 MG PO TABS
40.0000 mg | ORAL_TABLET | Freq: Every day | ORAL | Status: DC
  Administered 2020-07-03 – 2020-07-09 (×7): 40 mg via ORAL
  Filled 2020-07-02 (×8): qty 2

## 2020-07-02 NOTE — Progress Notes (Signed)
PROGRESS NOTE  Jack Mejia JHE:[id number] DOB: 1946-04-11 DOA: 06/24/2020 PCP: Erven Colla, DO  Brief History:  74 y.o.year old malewith a history of paroxysmal atrial fibrillation on apixaban, CKD stage III, hypertension, diastolic CHF, hyperlipidemia, diabetes mellitus type 2,macrocytic anemia following with hematology s/p iron injection and receiving Retacrit, and adenomatous colon polyps in 2011who was admitted 06/24/20 with sepsis and acuterespiratory failurein the setting of pneumonia and pulmonary edema withacute on chronic diastolic CHF.  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 Melvyn Novas and Dr Halford Chessman -Stopped amiodarone -Continue IV Solu-Medrol started 06/28/2020 through 07/02/2020, okay to start p.o. prednisone 40 mg daily on 07/03/2020 -Oxygen has been weaned down to less than 2 L/min from 15 L just a few days ago No longer requiring BiPAP -Wean oxygen as tolerated for saturation greater 92% BC x 2  11/29 >>> RESP viral Panel  PCR 11/29 neg MRSA PCR   11/30  Neg Strep urinary ag  11/30 neg  Legionella urinary ag  11/30 >>>neg  -Overall significant improvement since starting steroids on 06/28/2020 -Repeat chest x-ray on 07/02/2020 shows improvement -  Acute on chronic diastolic CHF -Volume overload status-worsened with transfusion of PRBC -Improved with IV Bumex We will decrease IV Bumex to 1  mg qam, apparently patient is intolerant to Lasix -Plan will be to discharge on p.o. torsemide as patient has  tolerated this in the past -Daily weights -Accurate I's and O's -05/08/2020 echo EF 65 to 70%, no WMA, grade 1 DD, trivial MR, moderate AAS --BNP is elevated  DM2- last A1c 8.3 reflecting uncontrolled DM with hyperglycemia -NovoLog sliding scale -Holding glipizide  CAP/Lobar pneumonia -COVID-19 PCR negative -Patient completed ceftriaxone and doxycycline -Continue bronchodilators and mucolytics -Repeat chest x-ray from 07/02/2020 noted  Severe sepsis -Present on admission -Presented with leukocytosis and tachypnea -Secondary to pneumonia as above -Lactic acid peaked 3.4 -Completed antibiotics as above -Sepsis pathophysiology appears to have resolved  Acute on chronic renal failure CKD stage IIIb -Baseline creatinine 2.0-2.2 -Presented with serum creatinine 2.51 -Monitor closely with diuresis AKI----acute kidney injury on CKD stage - 3B--due to sepsis and hypoperfusion -  creatinine on admission=2.51  , baseline creatinine =2.0     , creatinine is now=2 ----renally adjust medications, avoid nephrotoxic agents / dehydration  / hypotension  Hematochezia/ABLA -GI consult -baseline Hgb 9-10 -presented with Hgb 7.5 -Received a total of 2 units of PRBC this admission -GI consult appreciated -Patient's cardiopulmonary  status precludes endoluminal evaluation -Advance to GI soft diet  Paroxysmal Atrial Fibrillation -holding apixaban due to hematochezia -Continue Cardizem CD Stopped amiodarone due to concerns about possible amiodarone induced pneumonitis  Essential Hypertension--stable -Continue diltiazem CD -Continue metoprolol heart rate  Hyperlipidemia -continue statin  Hypokalemia/Hypomagnesemia--- replace and recheck, electrolytes abnormalities may persist due to high-dose diuretic use  Social/ethics--plan of care, advanced directives and goals of care discussed with patient and patient's 2 daughters [name] and [name] -Palliative care consult appreciated patient  is a DNR/DNI with full scope of treatment  Disposition---  PT eval over the next day or 2 as patient's respiratory status has improved, anticipate patient may need SNF rehab --  Transfer out of ICU to telemetry unit  Status is: Inpatient  Remains inpatient appropriate because:Acute hypoxic respiratory failure requiring IV diuretic and IV steroids   Dispo: The patient is from: Home              Anticipated d/c is to: SNF              Anticipated d/c date is: 2 days              Patient currently is not medically stable to d/c.  Family Communication: -Discussed with S/o at [phone] Discussed with daughters x 2  Consultants:  Pulm  Code Status:    Code Status: DNR  DVT Prophylaxis: SCDs  Procedures: As Listed in Progress Note Above  Antibiotics: Rocephin/doxy-completed  Subjective: - Shortness of breath and cough continues to improve -Able to wean patient down to 2 L of oxygen via nasal cannula -Continues to have significant fatigue and significant dyspnea on exertion from getting up from bed to bedside commode and chair  Objective: Vitals:   07/02/20 0732 07/02/20 1113 07/02/20 1140 07/02/20 1642  BP:   (!) 139/54   Pulse:   67   Resp:      Temp: 97.6 F (36.4 C) 97.8 F (36.6 C)  98.2 F (36.8 C)  TempSrc: Oral Oral  Oral  SpO2:   98%   Weight:      Height:        Intake/Output Summary (Last 24 hours) at 07/02/2020 1742 Last data filed at 07/02/2020 1300 Gross per 24 hour  Intake 720 ml  Output 3100 ml  Net -2380 ml   Weight change:  Exam:   General:  Pt is alert, follows commands appropriately,  HEENT: No icterus, No thrush, No neck mass, [place]/AT  Nose-- [place] 2L/min  Cardiovascular: RRR, S1/S2, irregular  Respiratory: Improving breath sounds, scattered Rhonchi  Abdomen: Soft/+BS, non tender, non distended, no guarding  Extremities: Good pedal pulses,   NeuroPsych--- generalized weakness without new focal deficits, affect is appropriate  Data  Reviewed: I have personally reviewed following labs and imaging studies  Basic Metabolic Panel: Recent Labs  Lab 06/26/20 0400 06/28/20 0531 06/30/20 0308 07/02/20 0442  NA 141 134* 131*  130* 132*  K 2.7* 2.9* 3.4*  3.4* 3.8  CL 110 104 99  99 98  CO2 19* 19* 21*  20* 25  GLUCOSE 159* 149* 364*  366* 231*  BUN 28* 26* 36*  36* 46*  CREATININE 1.57* 1.68* 1.72*  1.68* 1.67*  CALCIUM 6.7* 7.6* 7.8*  7.8* 8.2*  MG 2.0  --  1.9  --   PHOS  --   --  3.5  --    Liver Function Tests: Recent Labs  Lab 06/26/20 0400 06/30/20 0308  AST 39  --   ALT 62*  --   ALKPHOS 104  --   BILITOT 1.3*  --   PROT 5.4*  --   ALBUMIN 1.9* 2.1*   No results for input(s): LIPASE, AMYLASE in the last 168 hours. No results for input(s): AMMONIA in the last 168 hours. Coagulation Profile: No results for input(s): INR, PROTIME in the last 168 hours. CBC: Recent Labs  Lab 06/26/20 0400 06/26/20 2235 06/28/20 0531 06/30/20 0308 07/02/20 0442  WBC 18.9*  --  14.7* 9.5 16.4*  HGB 7.0* 9.5* 8.9* 8.2* 8.7*  HCT 23.5* 30.3* 28.9* 25.7* 28.3*  MCV 118.7*  --  105.1* 103.6* 103.3*  PLT 165  --  147*  148* 172   Cardiac Enzymes: No results for input(s): CKTOTAL, CKMB, CKMBINDEX, TROPONINI in the last 168 hours. BNP: Invalid input(s): POCBNP CBG: Recent Labs  Lab 07/02/20 0026 07/02/20 0409 07/02/20 0730 07/02/20 1121 07/02/20 1602  GLUCAP 273* 217* 250* 384* 472*   HbA1C: No results for input(s): HGBA1C in the last 72 hours. Urine analysis:    Component Value Date/Time   COLORURINE COLORLESS (A) 06/06/2020 1151   APPEARANCEUR CLEAR 06/06/2020 1151   LABSPEC 1.013 06/06/2020 1151   PHURINE 5.0 06/06/2020 1151   GLUCOSEU >=500 (A) 06/06/2020 1151   HGBUR MODERATE (A) 06/06/2020 1151   BILIRUBINUR NEGATIVE 06/06/2020 1151   KETONESUR NEGATIVE 06/06/2020 1151   PROTEINUR NEGATIVE 06/06/2020 1151   NITRITE NEGATIVE 06/06/2020 1151   LEUKOCYTESUR NEGATIVE 06/06/2020 1151    Sepsis Labs: @LABRCNTIP (procalcitonin:4,lacticidven:4) ) Recent Results (from the past 240 hour(s))  Resp Panel by RT-PCR (Flu A&B, Covid) Nasopharyngeal Swab     Status: None   Collection Time: 06/24/20 11:43 AM   Specimen: Nasopharyngeal Swab; Nasopharyngeal(NP) swabs in vial transport medium  Result Value Ref Range Status   SARS Coronavirus 2 by RT PCR NEGATIVE NEGATIVE Final    Comment: (NOTE) SARS-CoV-2 target nucleic acids are NOT DETECTED.  The SARS-CoV-2 RNA is generally detectable in upper respiratory specimens during the acute phase of infection. The lowest concentration of SARS-CoV-2 viral copies this assay can detect is 138 copies/mL. A negative result does not preclude SARS-Cov-2 infection and should not be used as the sole basis for treatment or other patient management decisions. A negative result may occur with  improper specimen collection/handling, submission of specimen other than nasopharyngeal swab, presence of viral mutation(s) within the areas targeted by this assay, and inadequate number of viral copies(<138 copies/mL). A negative result must be combined with clinical observations, patient history, and epidemiological information. The expected result is Negative.  Fact Sheet for Patients:  EntrepreneurPulse.com.au  Fact Sheet for Healthcare Providers:  IncredibleEmployment.be  This test is no t yet approved or cleared by the [place] FDA and  has been authorized for detection and/or diagnosis of SARS-CoV-2 by FDA under an Emergency Use Authorization (EUA). This EUA will remain  in effect (meaning this test can be used) for the duration of the COVID-19 declaration under Section 564(b)(1) of the Act, 21 U.S.C.section 360bbb-3(b)(1), unless the authorization is terminated  or revoked sooner.       Influenza A by PCR NEGATIVE NEGATIVE Final   Influenza B by PCR NEGATIVE NEGATIVE Final    Comment: (NOTE) The  Xpert Xpress SARS-CoV-2/FLU/RSV plus assay is intended as an aid in the diagnosis of influenza from Nasopharyngeal swab specimens and should not be used as a sole basis for treatment. Nasal washings and aspirates are unacceptable for Xpert Xpress SARS-CoV-2/FLU/RSV testing.  Fact Sheet for Patients: EntrepreneurPulse.com.au  Fact Sheet for Healthcare Providers: IncredibleEmployment.be  This test is not yet approved or cleared by the [place] FDA and has been authorized for detection and/or diagnosis of SARS-CoV-2 by FDA under an Emergency Use Authorization (EUA). This EUA will remain in effect (meaning this test can be used) for the duration of the COVID-19 declaration under Section 564(b)(1) of the Act, 21 U.S.C. section 360bbb-3(b)(1), unless the authorization is terminated or revoked.  Performed at [facility], [address] [place], [place] [address]   Blood culture (routine x 2)     Status: None   Collection Time: 06/24/20  2:04 PM   Specimen: Left Antecubital; Blood  Result  Value Ref Range Status   Specimen Description   Final    LEFT ANTECUBITAL BOTTLES DRAWN AEROBIC AND ANAEROBIC   Special Requests Blood Culture adequate volume  Final   Culture   Final    NO GROWTH 5 DAYS Performed at [facility], [address], [place], [place] [address]    Report Status 06/29/2020 FINAL  Final  Blood culture (routine x 2)     Status: None   Collection Time: 06/24/20  2:04 PM   Specimen: BLOOD LEFT HAND  Result Value Ref Range Status   Specimen Description   Final    BLOOD LEFT HAND BOTTLES DRAWN AEROBIC AND ANAEROBIC   Special Requests Blood Culture adequate volume  Final   Culture   Final    NO GROWTH 5 DAYS Performed at [facility], [address], [place], [place] [address]    Report Status 06/29/2020 FINAL  Final  MRSA PCR Screening     Status: None   Collection Time: 06/25/20  2:36 PM   Specimen: Nasopharyngeal  Result Value  Ref Range Status   MRSA by PCR NEGATIVE NEGATIVE Final    Comment:        The GeneXpert MRSA Assay (FDA approved for NASAL specimens only), is one component of a comprehensive MRSA colonization surveillance program. It is not intended to diagnose MRSA infection nor to guide or monitor treatment for MRSA infections. Performed at [facility], [address] [place],  [address]      Scheduled Meds: . atorvastatin  20 mg Oral Daily  . bumetanide (BUMEX) IV  1 mg Intravenous BID  . Chlorhexidine Gluconate Cloth  6 each Topical Daily  . diltiazem  360 mg Oral Daily  . feeding supplement (GLUCERNA SHAKE)  237 mL Oral TID  . hydrocortisone   Rectal BID  . insulin aspart  0-20 Units Subcutaneous TID WC  . insulin aspart  0-5 Units Subcutaneous QHS  . insulin detemir  10 Units Subcutaneous Daily  . mouth rinse  15 mL Mouth Rinse BID  . methylPREDNISolone (SOLU-MEDROL) injection  80 mg Intravenous Q12H  . metoprolol tartrate  25 mg Oral BID  . pantoprazole  40 mg Oral BID AC  . [START ON 07/03/2020] predniSONE  40 mg Oral Q breakfast   Continuous Infusions:   Procedures/Studies: DG Elbow Complete Left  Result Date: 06/06/2020 CLINICAL DATA:  Pain following fall EXAM: LEFT ELBOW - COMPLETE 3+ VIEW COMPARISON:  None. FINDINGS: Frontal, lateral, and bilateral oblique views were obtained. No fracture, dislocation, or joint effusion. There is spurring along the medial distal humeral condyle. No appreciable joint space narrowing or erosion. IMPRESSION: Spurring along the medial distal humeral condyle. No appreciable joint space narrowing. No fracture or dislocation. Electronically Signed   By: Lowella Grip III M.D.   On: 06/06/2020 12:18   US RENAL  Result Date: 06/17/2020 CLINICAL DATA:  Chronic renal disease stage IV EXAM: RENAL / URINARY TRACT ULTRASOUND COMPLETE COMPARISON:  April 21, 2019 FINDINGS: Right Kidney: Renal measurements: 10.7 cm x 5.2 cm x 5.5 cm = volume:  162.9 mL. Echogenicity within normal limits. A 3.1 cm x 3.3 cm x 2.7 cm complex anechoic structure is seen within the mid right kidney. No hydronephrosis is visualized. Left Kidney: Renal measurements: 12.3 cm x 7.2 cm x 6.1 cm = volume: 283.3 mL. Echogenicity within normal limits. 2.1 cm x 2.1 cm x 2.3 cm and 3.0 cm x 2.1 cm x 2.4 cm anechoic structures are seen within  the mid and lower left kidney. An 8.5 mm shadowing echogenic focus is also seen within the lower pole of the left kidney. No hydronephrosis is visualized. Bladder: Appears normal for degree of bladder distention. Bilateral ureteral jets are visualized. Other: None. IMPRESSION: 1. Bilateral renal cysts, mildly increased in size when compared to the prior study. 2. Subcentimeter non-obstructing renal stone within the lower pole of the left kidney. Electronically Signed   By: Virgina Norfolk M.D.   On: 06/17/2020 23:57   DG Chest Port 1 View  Result Date: 07/02/2020 CLINICAL DATA:  Pneumonitis. EXAM: PORTABLE CHEST 1 VIEW COMPARISON:  06/30/2020. FINDINGS: Cardiomegaly. Diffuse bilateral interstitial infiltrates again noted. Slight improvement from prior exam. No pleural effusion or pneumothorax. Thoracic spine scoliosis and degenerative change. IMPRESSION: 1. Cardiomegaly. 2. Diffuse bilateral interstitial infiltrates again noted. Slight improvement from prior exam. Electronically Signed   By: Marcello Moores  Register   On: 07/02/2020 07:30   DG CHEST PORT 1 VIEW  Result Date: 06/30/2020 CLINICAL DATA:  Sepsis, acute respiratory failure, pneumonia and pulmonary edema, acute on chronic CHF. EXAM: PORTABLE CHEST 1 VIEW COMPARISON:  Chest x-ray dated 06/27/2020 FINDINGS: Persistent bilateral airspace opacities, but slightly improved aeration of both lungs. No pleural effusion or pneumothorax is seen. Heart size and mediastinal contours are grossly stable. IMPRESSION: Slightly improved aeration of both lungs, but with persistent bilateral airspace  opacities, compatible with multifocal pneumonia versus pulmonary edema. Electronically Signed   By: Franki Cabot M.D.   On: 06/30/2020 05:49   DG CHEST PORT 1 VIEW  Result Date: 06/27/2020 CLINICAL DATA:  Shortness of breath. EXAM: PORTABLE CHEST 1 VIEW COMPARISON:  06/24/2020.  CT abdomen 06/19/2014. FINDINGS: Stable cardiomegaly. Progressive diffuse severe bilateral pulmonary infiltrates/edema. No prominent pleural effusion. No pneumothorax. Stable calcific densities noted over the right upper quadrant, most likely dystrophic calcification. Degenerative change thoracic spine and both shoulders. IMPRESSION: 1. Stable cardiomegaly. 2. Progressive diffuse severe bilateral pulmonary infiltrates/edema. Electronically Signed   By: Marcello Moores  Register   On: 06/27/2020 05:56   DG Chest Port 1 View  Result Date: 06/24/2020 CLINICAL DATA:  Shortness of breath. EXAM: PORTABLE CHEST 1 VIEW COMPARISON:  Dec 12, 2019. FINDINGS: Stable cardiomediastinal silhouette. No pneumothorax or pleural effusion is noted. Bilateral patchy airspace opacities are now noted concerning for multifocal pneumonia. Bony thorax is unremarkable IMPRESSION: Bilateral patchy airspace opacities are noted concerning for multifocal pneumonia. Electronically Signed   By: Marijo Conception M.D.   On: 06/24/2020 11:37   DG Hip Unilat W or Wo Pelvis 2-3 Views Left  Result Date: 06/06/2020 CLINICAL DATA:  Left hip pain since a fall 06/03/2020. Initial encounter. EXAM: DG HIP (WITH OR WITHOUT PELVIS) 2-3V LEFT COMPARISON:  Plain films left hip 03/23/2020. FINDINGS: There is no acute bony or joint abnormality. Hip joint spaces are preserved. Scattered enthesopathic change noted. IMPRESSION: No acute abnormality. Electronically Signed   By: Inge Rise M.D.   On: 06/06/2020 12:18   US Abdomen Limited RUQ (LIVER/GB)  Result Date: 06/25/2020 CLINICAL DATA:  Elevated LFTs, post cholecystectomy EXAM: ULTRASOUND ABDOMEN LIMITED RIGHT UPPER QUADRANT  COMPARISON:  September of 2018 CT evaluation FINDINGS: Gallbladder: Post cholecystectomy. Common bile duct: Diameter: 4.2 mm Liver: Mildly increased echogenicity of hepatic parenchyma without visible lesion on limited images. Portal vein is patent on color Doppler imaging with normal direction of blood flow towards the liver. Other: RIGHT pleural effusion, may be moderate. IMPRESSION: 1. Hepatic steatosis, mild. 2. No biliary duct dilation post cholecystectomy. 3. RIGHT-sided pleural  effusion partially imaged. Electronically Signed   By: Zetta Bills M.D.   On: 06/25/2020 15:07    [place] Duan Denton Brick, MD  [facility]  If 7PM-7AM, please contact night-coverage [email or web address] Password [email or web address] 07/02/2020, 5:42 PM   LOS: 8 days

## 2020-07-02 NOTE — Progress Notes (Signed)
NAME:  Jack Mejia, MRN:  [id number], DOB:  1945/12/02, LOS: 8 ADMISSION DATE:  06/24/2020, CONSULTATION DATE:  06/27/2020 REFERRING MD:  Dr. Denton Brick, [facility] COMPLAINT:  Hypoxia   Brief History   74 yo male with remote hx of smoking and hx of A fib on amiodarone presented with progressive dyspnea for 6 weeks prior to admission.  Found to have pulmonary infiltrates and hypoxia (SpO2 82% on room air).  Didn't improve with treatment for pneumonia or heart failure.  He had recent admission for hyperglycemia.  Past Medical History  OA, A fib, CAD, Depression, DM type 2, Fatty liver, Nephrolithiasis, HLD, HLD, CKD, Vertigo  Significant Hospital Events   11/29 admit, start ABx, start on Bipap/HFNC 12/02 increased ESR (130), amiodarone stopped 12/03 solumedrol started 12/07 transfer out of ICU 12/08 change to prednisone  Consults:  GI 11/30 Palliative care 12/02   Procedures:     Significant Diagnostic Tests:  Echo 05/08/20 >> EF 65 to 70%, mild LVH, grade 1 DD, RVSP 36.4 mmHg, mod AS  Micro Data:  COVID/Flu 11/29 >> negative  Blood 11/29 >> negative Legionella Ag 11/30 > negative Pneumococcal Ag 11/30 >> negative MRSA PCR 11/30 >> negative  Antimicrobials:  Vancomycin 11/29 Cefepime 11/29 Doxycyline 11/29 >> 12/02 Rocephin 11/29 >> 12/03  Interim history/subjective:  Still feels weak.  Gets tired walking in room.  Not having cough or chest pain.  Objective   Blood pressure (!) 139/54, pulse 67, temperature 97.8 F (36.6 C), temperature source Oral, resp. rate 20, height $RemoveBe'5\' 10"'[id number]$  (1.778 m), weight 96.4 kg, SpO2 98 %.        Intake/Output Summary (Last 24 hours) at 07/02/2020 1457 Last data filed at 07/02/2020 1300 Gross per 24 hour  Intake 1080 ml  Output 3100 ml  Net -2020 ml   Filed Weights   06/27/20 0409 06/28/20 0439 06/30/20 0400  Weight: 96 kg 95.7 kg 96.4 kg    Examination:  General - alert Eyes - pupils reactive ENT - no sinus tenderness, no  stridor Cardiac - regular rate/rhythm, 2/6 Chest - equal breath sounds b/l, no wheezing or rales Abdomen - soft, non tender, + bowel sounds Extremities - no cyanosis, clubbing, or edema Skin - no rashes Neuro - normal strength, moves extremities, follows commands Psych - normal mood and behavior   Resolved Hospital Problem list      Assessment & Plan:   Acute hypoxic respiratory failure. - most likely from acute pneumonitis secondary to amiodarone - amiodarone stopped 12/02 and started on solumedrol 12/03; has improved O2 needs since then - CXR from 12/07 shows improvement - change to prednisone 40 mg daily on 12/08 and decrease dose by 10 mg every 7 days as tolerated - goal SpO2 > 92%  Paroxysmal atrial fibrillation, chronic diastolic CHF, aortic stenosis, HLD. - per primary team  Steroid induced hyperglycemia. - per primary team  Anemia with FOBT positive. - GI consulted; not stable for endoscopy at this time - f/u CBC - transfuse for Hb < 7 or significant bleeding  CKD 3b. - baseline creatinine 2.02 from 06/07/20 - f/u BMET  Updated pt's family at bedside.  D/w Dr. Denton Brick.   Labs    CMP Latest Ref Rng & Units 07/02/2020 06/30/2020 06/30/2020  Glucose 70 - 99 mg/dL 231(H) 364(H) 366(H)  BUN 8 - 23 mg/dL 46(H) 36(H) 36(H)  Creatinine 0.61 - 1.24 mg/dL 1.67(H) 1.72(H) 1.68(H)  Sodium 135 - 145 mmol/L 132(L) 131(L) 130(L)  Potassium 3.5 -  5.1 mmol/L 3.8 3.4(L) 3.4(L)  Chloride 98 - 111 mmol/L 98 99 99  CO2 22 - 32 mmol/L 25 21(L) 20(L)  Calcium 8.9 - 10.3 mg/dL 8.2(L) 7.8(L) 7.8(L)  Total Protein 6.5 - 8.1 g/dL - - -  Total Bilirubin 0.3 - 1.2 mg/dL - - -  Alkaline Phos 38 - 126 U/L - - -  AST 15 - 41 U/L - - -  ALT 0 - 44 U/L - - -    CBC Latest Ref Rng & Units 07/02/2020 06/30/2020 06/28/2020  WBC 4.0 - 10.5 K/uL 16.4(H) 9.5 14.7(H)  Hemoglobin 13.0 - 17.0 g/dL 8.7(L) 8.2(L) 8.9(L)  Hematocrit 39 - 52 % 28.3(L) 25.7(L) 28.9(L)  Platelets 150 - 400 K/uL 172  148(L) 147(L)    ABG    Component Value Date/Time   PHART 7.484 (H) 06/24/2020 1224   PCO2ART 28.6 (L) 06/24/2020 1224   PO2ART 94.7 06/24/2020 1224   HCO3 23.2 06/24/2020 1224   TCO2 21 (L) 05/23/2019 1021   ACIDBASEDEF 1.7 06/24/2020 1224   O2SAT 98.0 06/24/2020 1224    CBG (last 3)  Recent Labs    07/02/20 0409 07/02/20 0730 07/02/20 1121  GLUCAP 217* 250* 384*    Lab Results  Component Value Date   ESRSEDRATE 128 (H) 07/02/2020    Signature:  Chesley Mires, MD [place] Pager - [phone] 07/02/2020, 2:57 PM

## 2020-07-02 NOTE — Progress Notes (Signed)
Inpatient Diabetes Program Recommendations  AACE/ADA: New Consensus Statement on Inpatient Glycemic Control   Target Ranges:  Prepandial:   less than 140 mg/dL      Peak postprandial:   less than 180 mg/dL (1-2 hours)      Critically ill patients:  140 - 180 mg/dL  Results for Jack Mejia, Jack Mejia (MRN [id number]) as of 07/02/2020 08:13  Ref. Range 07/02/2020 00:26 07/02/2020 04:09 07/02/2020 07:30  Glucose-Capillary Latest Ref Range: 70 - 99 mg/dL 273 (H) 217 (H) 250 (H)   Results for Jack Mejia, Jack Mejia (MRN [id number]) as of 07/02/2020 08:13  Ref. Range 07/01/2020 07:48 07/01/2020 11:07 07/01/2020 15:39 07/01/2020 20:17 07/01/2020 22:17 07/01/2020 23:34  Glucose-Capillary Latest Ref Range: 70 - 99 mg/dL 228 (H) 371 (H) 426 (H) 411 (H) 366 (H) 318 (H)   Review of Glycemic Control  Diabetes history:DM2 Outpatient Diabetes medications:Jardiance 10 mg QAM, Glipizide 10 mg BID Current orders for Inpatient glycemic control:Levemir 10 units daily, Novolog 0-20 units TID, Novolog 0-5 units QHS; Solumedrol 80 mg Q12H  Inpatient Diabetes Program Recommendations:    Insulin: If steroids continued as ordered, please consider increasing Levemir to 15 units BID and order Novolog 3 units TID with meals for meal coverage if patient eats at least 50% of meals.  Thanks, Barnie Alderman, RN, MSN, CDE Diabetes Coordinator Inpatient Diabetes Program [phone] (Team Pager from 8am to 5pm)

## 2020-07-02 NOTE — Progress Notes (Signed)
CRITICAL VALUE ALERT  Critical Value:  BG 455  Date & Time Notied:  07/02/20 2245  Provider Notified: Dr. Josephine Cables  Orders Received/Actions taken: Administered insulin as noted previously

## 2020-07-02 NOTE — Consult Note (Signed)
Consultation Note Date: 07/02/2020   Patient Name: Jack Mejia  DOB: October 08, 1945  MRN: [id number]  Age / Sex: 74 y.o., male  PCP: Erven Colla, DO Referring Physician: Roxan Hockey, MD  Reason for Consultation: Establishing goals of care  HPI/Patient Profile: 74 y.o. male  with past medical history of A. fib on amiodarone, Neri artery disease, DM 2, fatty liver, HLD, CKD, admitted on 06/24/2020 with worsening shortness of breath for several weeks.  Work-up revealed pulmonary infiltrates, pulmonary edema with acute on chronic CHF, pneumonia, and has established that he had amiodarone toxicity.  He has responded well to steroids.  Palliative medicine consulted for goals of care.  Clinical Assessment and Goals of Care:  I have reviewed medical records including EPIC notes, labs and imaging, received report from Dr. Denton Brick, examined the patient and met at bedside with patient, daughter, and his significant other to discuss diagnosis prognosis, GOC, EOL wishes, disposition and options.  Patient reports he is feeling better, his family also note that he is greatly improved.  As far as functional and nutritional status- prior to admission he lived at home independently- independent with all ADL's. No changes in nutritional status.    We discussed her current illness and what it means in the larger context of her on-going co-morbidities.  Natural disease trajectory and expectations at EOL were discussed.  I attempted to elicit values and goals of care important to the patient.  He was very reluctant to come to hospital. His daughter notes that he does not ever want to be a burden on anyone and would not want to live permanently disabled and dependent for care.   Disposition was discussed- patient is agreeable to short term stay at SNF for rehab- but would not want long term care.   Advanced directives,  concepts specific to code status, artifical feeding and hydration, and rehospitalization were considered and discussed. MOST form was completed with following selections: DNR, limited medical interventions, antibiotics if needed, IV fluids if needed, no feeding tube.  Hospice and Palliative Care services outpatient were explained and offered.  Patient and family are agreeable to outpatient palliative following patient at discharge.  Questions and concerns were addressed.  The family was encouraged to call with questions or concerns.   Primary Decision Maker PATIENT- with help of his significant other- Jack Mejia and daughter Jack Mejia- patient is very hard of hearing    SUMMARY OF RECOMMENDATIONS -will change code status from limited code to full DNR- patient expressed he did not want any attempts at resuscitation if he experiences cardiac and/or respiratory arrest -Continue current scope with plans for rehab at SNF- family requests [place] completed electronically in [place] referral for outpatient Palliative to see at discharge    Code Status/Advance Care Planning:  DNR   Prognosis:    Unable to determine  Discharge Planning: [facility] for rehab with Palliative care service follow-up  Primary Diagnoses: Present on Admission: . Acute respiratory failure with hypoxia ([place]) . PAF (paroxysmal atrial  fibrillation) ([place]) . HTN (hypertension) . Type 2 diabetes with nephropathy ([place]) . Hyperlipidemia LDL goal <100 . Depression, major, single episode, mild ([place]) . Multifocal pneumonia . Acute-on-chronic kidney injury ([place]) . Acute on chronic diastolic (congestive) heart failure ([place])   I have reviewed the medical record, interviewed the patient and family, and examined the patient. The following aspects are pertinent.  Past Medical History:  Diagnosis Date  . Arthritis   . Asthma   . Atrial fibrillation ([place])    a. s/p DCCV in 03/2019  . CAD (coronary  artery disease) 06/29/2019  . Cramps of left lower extremity   . Depression   . Diabetes mellitus    type 2 for 7-8 yrs  . Dyspnea   . Elevated liver enzymes   . Fatty liver   . History of kidney stones   . Hyperlipidemia   . Hypertension   . Renal insufficiency   . Vertigo    Social History   Socioeconomic History  . Marital status: Divorced    Spouse name: Not on file  . Number of children: 2  . Years of education: Not on file  . Highest education level: Not on file  Occupational History  . Occupation: RETIRED  Tobacco Use  . Smoking status: Former Smoker    Packs/day: 4.00    Years: 20.00    Pack years: 80.00    Types: Cigarettes    Quit date: 10/14/1982    Years since quitting: 37.7  . Smokeless tobacco: Never Used  . Tobacco comment: 29 yrs ago  Vaping Use  . Vaping Use: Never used  Substance and Sexual Activity  . Alcohol use: No  . Drug use: No  . Sexual activity: Not Currently    Birth control/protection: None  Other Topics Concern  . Not on file  Social History Narrative  . Not on file   Social Determinants of Health   Financial Resource Strain:   . Difficulty of Paying Living Expenses: Not on file  Food Insecurity:   . Worried About Charity fundraiser in the Last Year: Not on file  . Ran Out of Food in the Last Year: Not on file  Transportation Needs:   . Lack of Transportation (Medical): Not on file  . Lack of Transportation (Non-Medical): Not on file  Physical Activity:   . Days of Exercise per Week: Not on file  . Minutes of Exercise per Session: Not on file  Stress:   . Feeling of Stress : Not on file  Social Connections:   . Frequency of Communication with Friends and Family: Not on file  . Frequency of Social Gatherings with Friends and Family: Not on file  . Attends Religious Services: Not on file  . Active Member of Clubs or Organizations: Not on file  . Attends Archivist Meetings: Not on file  . Marital Status: Not on  file   Scheduled Meds: . atorvastatin  20 mg Oral Daily  . bumetanide (BUMEX) IV  1 mg Intravenous BID  . Chlorhexidine Gluconate Cloth  6 each Topical Daily  . diltiazem  360 mg Oral Daily  . feeding supplement (GLUCERNA SHAKE)  237 mL Oral TID  . hydrocortisone   Rectal BID  . insulin aspart  0-20 Units Subcutaneous TID WC  . insulin aspart  0-5 Units Subcutaneous QHS  . insulin detemir  10 Units Subcutaneous Daily  . mouth rinse  15 mL Mouth Rinse BID  . methylPREDNISolone (SOLU-MEDROL) injection  80 mg Intravenous Q12H  . metoprolol tartrate  25 mg Oral BID  . pantoprazole  40 mg Oral BID AC  . [START ON 07/03/2020] predniSONE  40 mg Oral Q breakfast   Continuous Infusions: PRN Meds:.acetaminophen **OR** acetaminophen, albuterol, ALPRAZolam, polyethylene glycol Medications Prior to Admission:  Prior to Admission medications   Medication Sig Start Date End Date Taking? Authorizing Provider  acetaminophen (TYLENOL) 650 MG CR tablet Take 650 mg by mouth every 8 (eight) hours as needed for pain.   Yes [provider]  amiodarone (PACERONE) 200 MG tablet TAKE 1 TABLET BY MOUTH EVERY DAY 12/05/19  Yes Herminio Commons, MD  apixaban (ELIQUIS) 5 MG TABS tablet Take 1 tablet (5 mg total) by mouth 2 (two) times daily. 10/03/19  Yes Herminio Commons, MD  atorvastatin (LIPITOR) 20 MG tablet Take 1 tablet (20 mg total) by mouth daily. 10/03/19 09/27/20 Yes Herminio Commons, MD  diltiazem (CARDIZEM CD) 360 MG 24 hr capsule Take 1 capsule (360 mg total) by mouth daily. 04/22/20  Yes Strader, [place], PA-C  empagliflozin (JARDIANCE) 10 MG TABS tablet Take 1 tablet (10 mg total) by mouth daily before breakfast. 04/23/20  Yes Lovena Le, Malena M, DO  glipiZIDE (GLUCOTROL) 5 MG tablet Take 2 tablets (10 mg total) by mouth 2 (two) times daily. 04/22/20  Yes Lovena Le, Malena M, DO  ibuprofen (ADVIL) 200 MG tablet Take 200 mg by mouth every 6 (six) hours as needed.   Yes [provider]   metoprolol tartrate (LOPRESSOR) 25 MG tablet Take 1 tablet (25 mg total) by mouth 2 (two) times daily. 04/30/20  Yes Lovena Le, Malena M, DO  torsemide (DEMADEX) 20 MG tablet Take 1 tablet (20 mg total) by mouth daily. 05/31/20 05/26/21 Yes Verta Ellen., NP  traMADol (ULTRAM) 50 MG tablet Take 1 tablet (50 mg total) by mouth every 12 (twelve) hours as needed for severe pain. 06/18/20 06/18/21 Yes Lovena Le, Malena M, DO  Vitamin D, Ergocalciferol, (DRISDOL) 1.25 MG (50000 UNIT) CAPS capsule Take 1 capsule (50,000 Units total) by mouth every 7 (seven) days. 06/07/20  Yes Barton Dubois, MD  allopurinol (ZYLOPRIM) 100 MG tablet Take 1 tablet (100 mg total) by mouth daily. For gout 06/18/20   Elvia Collum M, DO   Allergies  Allergen Reactions  . Demerol Nausea And Vomiting  . Vasotec [Enalapril] Cough  . Lasix [Furosemide] Rash   Review of Systems  Unable to perform ROS: Other    Physical Exam Vitals and nursing note reviewed.  Constitutional:      Appearance: Normal appearance.  Cardiovascular:     Rate and Rhythm: Normal rate.  Pulmonary:     Effort: Pulmonary effort is normal.  Neurological:     General: No focal deficit present.     Mental Status: He is alert. He is disoriented.     Vital Signs: BP (!) 139/54   Pulse 67   Temp 97.8 F (36.6 C) (Oral)   Resp 20   Ht 5' 10" (1.778 m)   Wt 96.4 kg   SpO2 98%   BMI 30.49 kg/m  Pain Scale: 0-10 POSS *See Group Information*: 1-Acceptable,Awake and alert Pain Score: 0-No pain   SpO2: SpO2: 98 % O2 Device:SpO2: 98 % O2 Flow Rate: .O2 Flow Rate (L/min): 2 L/min  IO: Intake/output summary:   Intake/Output Summary (Last 24 hours) at 07/02/2020 1525 Last data filed at 07/02/2020 1300 Gross per 24 hour  Intake 1080 ml  Output 3100  ml  Net -2020 ml    LBM: Last BM Date: 07/01/20 Baseline Weight: Weight: 90.7 kg Most recent weight: Weight: 96.4 kg     Palliative Assessment/Data: PPS: 30%     Thank you for this  consult. Palliative medicine will continue to follow and assist as needed.   Time In: 1432 Time Out: 1545 Time Total: 73 minutes Greater than 50%  of this time was spent counseling and coordinating care related to the above assessment and plan.  Signed by: Mariana Kaufman, AGNP-C Palliative Medicine    Please contact Palliative Medicine Team phone at [phone] for questions and concerns.  For individual provider: See Shea Evans

## 2020-07-03 DIAGNOSIS — N183 Chronic kidney disease, stage 3 unspecified: Secondary | ICD-10-CM

## 2020-07-03 LAB — GLUCOSE, CAPILLARY
Glucose-Capillary: 358 mg/dL — ABNORMAL HIGH (ref 70–99)
Glucose-Capillary: 401 mg/dL — ABNORMAL HIGH (ref 70–99)
Glucose-Capillary: 469 mg/dL — ABNORMAL HIGH (ref 70–99)
Glucose-Capillary: 431 mg/dL — ABNORMAL HIGH (ref 70–99)
Glucose-Capillary: 328 mg/dL — ABNORMAL HIGH (ref 70–99)
Glucose-Capillary: 262 mg/dL — ABNORMAL HIGH (ref 70–99)

## 2020-07-03 MED ORDER — INSULIN ASPART 100 UNIT/ML ~~LOC~~ SOLN
20.0000 [IU] | Freq: Once | SUBCUTANEOUS | Status: AC
  Administered 2020-07-03: 20 [IU] via SUBCUTANEOUS

## 2020-07-03 MED ORDER — INSULIN DETEMIR 100 UNIT/ML ~~LOC~~ SOLN
10.0000 [IU] | Freq: Once | SUBCUTANEOUS | Status: AC
  Administered 2020-07-03: 10 [IU] via SUBCUTANEOUS
  Filled 2020-07-03: qty 0.1

## 2020-07-03 MED ORDER — INSULIN DETEMIR 100 UNIT/ML ~~LOC~~ SOLN
20.0000 [IU] | Freq: Every day | SUBCUTANEOUS | Status: DC
  Administered 2020-07-04 – 2020-07-06 (×3): 20 [IU] via SUBCUTANEOUS
  Filled 2020-07-03 (×6): qty 0.2

## 2020-07-03 MED ORDER — INSULIN ASPART 100 UNIT/ML ~~LOC~~ SOLN
25.0000 [IU] | Freq: Once | SUBCUTANEOUS | Status: AC
  Administered 2020-07-03: 25 [IU] via SUBCUTANEOUS

## 2020-07-03 MED ORDER — INSULIN ASPART 100 UNIT/ML ~~LOC~~ SOLN
5.0000 [IU] | Freq: Three times a day (TID) | SUBCUTANEOUS | Status: DC
  Administered 2020-07-03 – 2020-07-05 (×6): 5 [IU] via SUBCUTANEOUS

## 2020-07-03 MED ORDER — BUMETANIDE 0.25 MG/ML IJ SOLN
1.0000 mg | Freq: Two times a day (BID) | INTRAMUSCULAR | Status: DC
  Administered 2020-07-03 – 2020-07-10 (×14): 1 mg via INTRAVENOUS
  Filled 2020-07-03 (×18): qty 4

## 2020-07-03 NOTE — Evaluation (Signed)
Physical Therapy Evaluation Patient Details Name: Jack Mejia MRN: [id number] DOB: [birth year] Today's Date: 07/03/2020   History of Present Illness  Jack Mejia is a 74 y.o. male with medical history significant for paroxysmal atrial fibrillation on anticoagulation, CHF,, CKD 3, hypertension.  History is obtained from chart review and EDP as at the time of my evaluation patient is on BiPAP.Patient presented to the ED with complaints of difficulty breathing over the past month that significantly worsened over the past week.  Patient also has swelling in his lower extremities but this is not new.  He has been taking Demadex at home.  He also noticed blood in his depends, but he denied blood in his urine.    Clinical Impression  Patient very unsteady on feet and at Mod/severe risk for falls, limited to a few steps at bedside using SPC secondary to c/o fatigue, attempted activity without O2, SpO2 dropped from 94% to 85% and had to put back on 2.5 LPM O2 for rest of visit.  Patient tolerated sitting up in chair after therapy - nursing staff aware.  Patient will benefit from continued physical therapy in hospital and recommended venue below to increase strength, balance, endurance for safe ADLs and gait.     Follow Up Recommendations SNF;Supervision for mobility/OOB;Supervision - Intermittent    Equipment Recommendations  None recommended by PT    Recommendations for Other Services       Precautions / Restrictions Precautions Precautions: Fall Restrictions Weight Bearing Restrictions: No      Mobility  Bed Mobility Overal bed mobility: Needs Assistance Bed Mobility: Supine to Sit;Sit to Supine     Supine to sit: Min guard Sit to supine: Min guard   General bed mobility comments: increased time, labored movement    Transfers Overall transfer level: Needs assistance Equipment used: Straight cane Transfers: Sit to/from [facility] Sit to Stand: Min  assist Stand pivot transfers: Min assist       General transfer comment: very slow labored movement  Ambulation/Gait Ambulation/Gait assistance: Min assist Gait Distance (Feet): 4 Feet Assistive device: Straight cane Gait Pattern/deviations: Decreased step length - right;Decreased step length - left;Decreased stride length Gait velocity: decreased   General Gait Details: limited to 4-5 slow labored unsteady steps at bedside due to fatigue  Stairs            Wheelchair Mobility    Modified Rankin (Stroke Patients Only)       Balance Overall balance assessment: Needs assistance Sitting-balance support: Feet supported;No upper extremity supported Sitting balance-Leahy Scale: Good Sitting balance - Comments: seated at EOB   Standing balance support: During functional activity;No upper extremity supported Standing balance-Leahy Scale: Poor Standing balance comment: fair/poor using SPC                             Pertinent Vitals/Pain Pain Assessment: Faces Faces Pain Scale: Hurts little more Pain Location: generalized pain all over, mostly legs Pain Descriptors / Indicators: Discomfort;Sore Pain Intervention(s): Limited activity within patient's tolerance;Monitored during session    [place] expects to be discharged to:: Private residence Living Arrangements: Non-relatives/Friends Available Help at Discharge: Family;Available PRN/intermittently Type of Home: House Home Access: Level entry;Stairs to enter Entrance Stairs-Rails: Right;Left;Can reach both Entrance Stairs-Number of Steps: 4 Home Layout: One level Home Equipment: Walker - 2 wheels;Cane - single point;Shower seat - built in      Prior Function Level of Independence: Independent with  assistive device(s)         Comments: household and short distanced community ambulator with [facility] PRN     Hand Dominance   Dominant Hand: Right    Extremity/Trunk Assessment    Upper Extremity Assessment Upper Extremity Assessment: Generalized weakness    Lower Extremity Assessment Lower Extremity Assessment: Generalized weakness    Cervical / Trunk Assessment Cervical / Trunk Assessment: Normal  Communication   Communication: HOH  Cognition Arousal/Alertness: Awake/alert Behavior During Therapy: WFL for tasks assessed/performed Overall Cognitive Status: Within Functional Limits for tasks assessed                                        General Comments      Exercises     Assessment/Plan    PT Assessment Patient needs continued PT services  PT Problem List Decreased strength;Decreased mobility;Decreased activity tolerance;Decreased balance       PT Treatment Interventions DME instruction;Gait training;Functional mobility training;Therapeutic exercise;Patient/family education;Balance training;Therapeutic activities;Stair training    PT Goals (Current goals can be found in the Care Plan section)  Acute Rehab PT Goals Patient Stated Goal: return after rehab PT Goal Formulation: With patient Time For Goal Achievement: 07/17/20 Potential to Achieve Goals: Good    Frequency Min 3X/week   Barriers to discharge        Co-evaluation               AM-PAC PT "6 Clicks" Mobility  Outcome Measure Help needed turning from your back to your side while in a flat bed without using bedrails?: None Help needed moving from lying on your back to sitting on the side of a flat bed without using bedrails?: A Little Help needed moving to and from a bed to a chair (including a wheelchair)?: A Little Help needed standing up from a chair using your arms (e.g., wheelchair or bedside chair)?: A Lot Help needed to walk in hospital room?: A Lot Help needed climbing 3-5 steps with a railing? : A Lot 6 Click Score: 16    End of Session Equipment Utilized During Treatment: Oxygen Activity Tolerance: Patient tolerated treatment well;Patient  limited by fatigue Patient left: in chair;with call bell/phone within reach Nurse Communication: Mobility status PT Visit Diagnosis: Unsteadiness on feet (R26.81);Other abnormalities of gait and mobility (R26.89);Muscle weakness (generalized) (M62.81)    Time: 3007-6226 PT Time Calculation (min) (ACUTE ONLY): 25 min   Charges:     PT Treatments $Therapeutic Activity: 23-37 mins        3:12 PM, 07/03/20 Lonell Grandchild, MPT Physical Therapist with [facility] [phone] office [phone] mobile phone

## 2020-07-03 NOTE — Progress Notes (Signed)
30 day note  DOB: 01/20/1946 Date: 07/03/20  Must ID: [id number]  To Whom it May Concern:  Please be advised that the above named patient will require a short-term nursing home stay- anticipated 30 days or less rehabilitation and strengthening. The plan is for return home.

## 2020-07-03 NOTE — Plan of Care (Signed)
  Problem: Acute Rehab PT Goals(only PT should resolve) Goal: Pt Will Go Supine/Side To Sit Outcome: Progressing Flowsheets (Taken 07/03/2020 1514) Pt will go Supine/Side to Sit:  Independently  with modified independence Goal: Patient Will Transfer Sit To/From Stand Outcome: Progressing Flowsheets (Taken 07/03/2020 1514) Patient will transfer sit to/from stand: with min guard assist Goal: Pt Will Transfer Bed To Chair/Chair To Bed Outcome: Progressing Flowsheets (Taken 07/03/2020 1514) Pt will Transfer Bed to Chair/Chair to Bed: min guard assist Goal: Pt Will Ambulate Outcome: Progressing Flowsheets (Taken 07/03/2020 1514) Pt will Ambulate:  25 feet  with min guard assist  with minimal assist  with least restrictive assistive device   3:14 PM, 07/03/20 Lonell Grandchild, MPT Physical Therapist with [facility] [phone] office [phone] mobile phone

## 2020-07-03 NOTE — Progress Notes (Signed)
Patient's Bipap order is PRN.  No distress noted at this time.  Patient on 2L [place] with sat in upper 90s.  Bipap not needed at this time.

## 2020-07-03 NOTE — Progress Notes (Signed)
Inpatient Diabetes Program Recommendations  AACE/ADA: New Consensus Statement on Inpatient Glycemic Control   Target Ranges:  Prepandial:   less than 140 mg/dL      Peak postprandial:   less than 180 mg/dL (1-2 hours)      Critically ill patients:  140 - 180 mg/dL   Results for Jack Mejia, Jack Mejia (MRN [id number]) as of 07/03/2020 08:16  Ref. Range 07/02/2020 07:30 07/02/2020 11:21 07/02/2020 16:02 07/02/2020 19:23 07/02/2020 23:04 07/03/2020 05:01 07/03/2020 07:30  Glucose-Capillary Latest Ref Range: 70 - 99 mg/dL 250 (H)  Novolog 7 units  Levemir 10 units@10 :34 384 (H)  Novolog 20 units 472 (H)  Novolog 20 units 455 (H) 482 (H)  Novolog 5 units 358 (H) 401 (H)   Review of Glycemic Control  Diabetes history:DM2 Outpatient Diabetes medications:Jardiance 10 mg QAM, Glipizide 10 mg BID Current orders for Inpatient glycemic control:Levemir 10 units daily,Novolog 0-20 units TID, Novolog 0-5 units QHS; Prednisone 40 mg QAM  Inpatient Diabetes Program Recommendations:  Insulin: If steroids continued as ordered, please consider increasing Levemir to 20 units daily and ordering Novolog 3 units TID with meals for meal coverage if patient eats at least 50% of meals.  NOTE: Noted steroids decreased and last dose of Solumedrol 80 mg was at at 3:03 am today.   Thanks, Barnie Alderman, RN, MSN, CDE Diabetes Coordinator Inpatient Diabetes Program [phone] (Team Pager from 8am to 5pm)

## 2020-07-03 NOTE — NC FL2 (Signed)
[place] LEVEL OF CARE SCREENING TOOL     IDENTIFICATION  Patient Name: Jack Mejia Birthdate: 1945/12/24 Sex: male Admission Date (Current Location): 06/24/2020  [facility] and [place] Number:  [employer] and Address:  [facility] [address], [place]      Provider Number: [id number]  Attending Physician Name and Address:  Kathie Dike, MD  Relative Name and Phone Number:       Current Level of Care: Hospital Recommended Level of Care: [place] Prior Approval Number:    Date Approved/Denied:   PASRR Number: pending  Discharge Plan: SNF    Current Diagnoses: Patient Active Problem List   Diagnosis Date Noted  . Pressure injury of skin 07/02/2020  . Dyspnea and respiratory abnormalities   . Acute on chronic diastolic CHF (congestive heart failure) ([place]) 06/25/2020  . Acute blood loss anemia 06/25/2020  . Acute renal failure superimposed on stage 3b chronic kidney disease ([place]) 06/25/2020  . Hematochezia   . Elevated LFTs   . Acute respiratory failure with hypoxia ([place]) 06/24/2020  . Multifocal pneumonia 06/24/2020  . Contusion of left hip   . General weakness   . Hyperosmolar hyperglycemic state (HHS) ([place]) 06/06/2020  . PAF (paroxysmal atrial fibrillation) ([place]) 06/06/2020  . CKD (chronic kidney disease), stage III ([place]) 06/06/2020  . Acute gout of right knee 05/05/2020  . Anemia, iron deficiency 01/01/2020  . Macrocytic anemia 12/14/2019  . Coronary artery disease involving native coronary artery of native heart without angina pectoris   . Acute-on-chronic kidney injury ([place]) 04/21/2019  . Acute on chronic diastolic (congestive) heart failure ([place]) 04/15/2019  . Atrial fibrillation with rapid ventricular response ([place]) 02/21/2019  . Depression, major, single episode, mild ([place]) 03/01/2017  . Lumbar stenosis with neurogenic claudication 08/06/2016  . Erectile dysfunction 12/05/2012  .  Sensory neuropathy 12/05/2012  . Hyperlipidemia LDL goal <100 12/05/2012  . Rectal bleeding 10/13/2012  . HTN (hypertension) 07/07/2011  . Type 2 diabetes with nephropathy ([place]) 07/07/2011  . Fatty liver 07/07/2011    Orientation RESPIRATION BLADDER Height & Weight     Self, Time, Situation, Place  O2 (2L) External catheter Weight: 212 lb 8.4 oz (96.4 kg) Height:  5\' 10"  (177.8 cm)  BEHAVIORAL SYMPTOMS/MOOD NEUROLOGICAL BOWEL NUTRITION STATUS      Continent Diet (Heart healthy/carb modified.)  AMBULATORY STATUS COMMUNICATION OF NEEDS Skin   Extensive Assist Verbally Bruising, Other (Comment) (Stage 2 to rectum with foam dressing)                       Personal Care Assistance Level of Assistance  Bathing, Feeding, Dressing Bathing Assistance: Maximum assistance Feeding assistance: Limited assistance Dressing Assistance: Maximum assistance     Functional Limitations Info  Hearing, Speech, Sight Sight Info: Adequate Hearing Info: Impaired Speech Info: Adequate    SPECIAL CARE FACTORS FREQUENCY  PT (By licensed PT)     PT Frequency: 5x weekly              Contractures      Additional Factors Info  Code Status, Allergies Code Status Info: DNR Allergies Info: Demerol, Vasotec (enalapril), Lasix (furosemide)           Current Medications (07/03/2020):  This is the current hospital active medication list Current Facility-Administered Medications  Medication Dose Route Frequency Provider Last Rate Last Admin  . acetaminophen (TYLENOL) tablet 650 mg  650 mg Oral Q6H PRN Emokpae, Ejiroghene  E, MD   650 mg at 06/29/20 1631   Or  . acetaminophen (TYLENOL) suppository 650 mg  650 mg Rectal Q6H PRN Emokpae, Ejiroghene E, MD      . albuterol (PROVENTIL) (2.5 MG/3ML) 0.083% nebulizer solution 2.5 mg  2.5 mg Nebulization Q2H PRN Emokpae, Ejiroghene E, MD   2.5 mg at 06/25/20 0321  . ALPRAZolam Duanne Moron) tablet 0.25 mg  0.25 mg Oral BID PRN Denton Brick, Courage, MD   0.25 mg  at 06/28/20 2100  . atorvastatin (LIPITOR) tablet 20 mg  20 mg Oral Daily Emokpae, Ejiroghene E, MD   20 mg at 07/03/20 0915  . bumetanide (BUMEX) injection 1 mg  1 mg Intravenous Daily Emokpae, Courage, MD   1 mg at 07/03/20 0915  . Chlorhexidine Gluconate Cloth 2 % PADS 6 each  6 each Topical Daily Tat, David, MD   6 each at 07/03/20 0915  . diltiazem (CARDIZEM CD) 24 hr capsule 360 mg  360 mg Oral Daily Emokpae, Ejiroghene E, MD   360 mg at 07/03/20 0915  . feeding supplement (GLUCERNA SHAKE) (GLUCERNA SHAKE) liquid 237 mL  237 mL Oral TID Denton Brick, Courage, MD   237 mL at 07/02/20 1401  . hydrocortisone (ANUSOL-HC) 2.5 % rectal cream   Rectal BID Erenest Rasher, PA-C   Given at 07/03/20 0915  . insulin aspart (novoLOG) injection 0-20 Units  0-20 Units Subcutaneous TID WC Adefeso, Oladapo, DO   20 Units at 07/03/20 0830  . insulin aspart (novoLOG) injection 0-5 Units  0-5 Units Subcutaneous QHS Onnie Boer Q, RPH-CPP   5 Units at 07/02/20 2307  . insulin aspart (novoLOG) injection 20 Units  20 Units Subcutaneous Once Kathie Dike, MD      . insulin aspart (novoLOG) injection 5 Units  5 Units Subcutaneous TID WC Memon, Jehanzeb, MD      . insulin detemir (LEVEMIR) injection 10 Units  10 Units Subcutaneous Once Kathie Dike, MD      . Derrill Memo ON 07/04/2020] insulin detemir (LEVEMIR) injection 20 Units  20 Units Subcutaneous Daily Kathie Dike, MD      . MEDLINE mouth rinse  15 mL Mouth Rinse BID Tat, David, MD   15 mL at 07/03/20 0915  . metoprolol tartrate (LOPRESSOR) tablet 25 mg  25 mg Oral BID Emokpae, Ejiroghene E, MD   25 mg at 07/03/20 0915  . pantoprazole (PROTONIX) EC tablet 40 mg  40 mg Oral BID AC Tanda Rockers, MD   40 mg at 07/03/20 0830  . polyethylene glycol (MIRALAX / GLYCOLAX) packet 17 g  17 g Oral Daily PRN Emokpae, Ejiroghene E, MD      . predniSONE (DELTASONE) tablet 40 mg  40 mg Oral Q breakfast Chesley Mires, MD   40 mg at 07/03/20 0830     Discharge  Medications: Please see discharge summary for a list of discharge medications.  Relevant Imaging Results:  Relevant Lab Results:   Additional Information [phone]. Pt has received Pfizer vaccinations per chart.  Salome Arnt, LCSW

## 2020-07-03 NOTE — Progress Notes (Signed)
PROGRESS NOTE  Jack Mejia:[id number] DOB: 02-27-1946 DOA: 06/24/2020 PCP: Erven Colla, DO  Brief History:  74 y.o.year old malewith a history of paroxysmal atrial fibrillation on apixaban, CKD stage III, hypertension, diastolic CHF, hyperlipidemia, diabetes mellitus type 2,macrocytic anemia following with hematology s/p iron injection and receiving Retacrit, and adenomatous colon polyps in 2011who was admitted 06/24/20 with sepsis and acuterespiratory failurein the setting of pneumonia and pulmonary edema withacute on chronic diastolic CHF.  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 Melvyn Novas and Dr Halford Chessman -Stopped amiodarone -Continue IV Solu-Medrol started 06/28/2020 through 07/02/2020, okay to start p.o. prednisone 40 mg daily on 07/03/2020 -Oxygen has been weaned down to less than 2 L/min from 15 L just a few days ago No longer requiring BiPAP -Wean oxygen as tolerated for saturation greater 92% BC x 2  11/29 >>> RESP viral Panel  PCR 11/29 neg MRSA PCR   11/30  Neg Strep urinary ag  11/30 neg  Legionella urinary ag  11/30 >>>neg  -Overall significant improvement since starting steroids on 06/28/2020 -Repeat chest x-ray on 07/02/2020 shows improvement -  Acute on chronic diastolic CHF -Volume overload status-worsened with transfusion of PRBC -Improved with IV Bumex -Continues to have volume overload and shortness of breath -Increase Bumex to twice daily -Plan will be to discharge on p.o. torsemide as patient has  tolerated this in the past -Daily weights -Accurate I's and O's -05/08/2020 echo EF 65 to 70%, no WMA, grade 1 DD, trivial MR, moderate AAS --BNP is elevated  DM2- last A1c 8.3 reflecting uncontrolled DM with hyperglycemia -NovoLog sliding scale -Holding glipizide -Blood sugars uncontrolled with hyperglycemia, likely related to steroids -Adjusting Levemir and adding NovoLog with meals  CAP/Lobar pneumonia -COVID-19 PCR negative -Patient completed ceftriaxone and doxycycline -Continue bronchodilators and mucolytics -Repeat chest x-ray from 07/02/2020 noted  Severe sepsis -Present on admission -Presented with leukocytosis and tachypnea -Secondary to pneumonia as above -Lactic acid peaked 3.4 -Completed antibiotics as above -Sepsis pathophysiology appears to have resolved  Acute on chronic renal failure CKD stage IIIb -Baseline creatinine 2.0-2.2 -Presented with serum creatinine 2.51 -Monitor closely with diuresis AKI----acute kidney injury on CKD stage - 3B--due to sepsis and hypoperfusion -  creatinine on admission=2.51  , baseline creatinine =2.0     , creatinine is now=1.6 ----renally adjust medications, avoid nephrotoxic agents / dehydration  / hypotension  Hematochezia/ABLA -GI consult -baseline Hgb 9-10 -presented with Hgb 7.5 -Received a total of 2 units of PRBC this admission -Follow-up hemoglobin improved to 9 and has been fluctuating between 8 and 9 -GI consult appreciated -Patient's cardiopulmonary  status precludes endoluminal evaluation -Advance to GI soft diet  Paroxysmal Atrial Fibrillation -holding apixaban due to hematochezia -Continue Cardizem CD Stopped amiodarone due to concerns about possible amiodarone induced pneumonitis  Essential Hypertension--stable -Continue diltiazem CD -Continue metoprolol heart rate  Hyperlipidemia -continue statin  Hypokalemia/Hypomagnesemia--- replace and recheck, electrolytes abnormalities may persist due to  high-dose diuretic use  Social/ethics--plan of care, advanced directives and goals of care discussed with patient and patient's 2 daughters [name] and [name] -Palliative care consult appreciated patient is a DNR/DNI with  full scope of treatment  Disposition--- seen by physical therapy with recommendations for skilled nursing facility placement  Status is: Inpatient  Remains inpatient appropriate because:Acute hypoxic respiratory failure requiring IV diuretic and IV steroids   Dispo: The patient is from: Home              Anticipated d/c is to: SNF              Anticipated d/c date is: 2 days              Patient currently is not medically stable to d/c.  Family Communication: -Discussed with patient  Consultants:  Pulm  Code Status:    Code Status: DNR  DVT Prophylaxis: SCDs  Procedures: As Listed in Progress Note Above  Antibiotics: Rocephin/doxy-completed  Subjective: - Continues to feel short of breath today and has a cough.  Cough is mostly nonproductive.  Objective: Vitals:   07/03/20 1500 07/03/20 1600 07/03/20 1611 07/03/20 1700  BP:      Pulse: 63 (!) 59 62 64  Resp: (!) 28     Temp:   98.1 F (36.7 C)   TempSrc:   Oral   SpO2: 99% 100% 100% 100%  Weight:      Height:        Intake/Output Summary (Last 24 hours) at 07/03/2020 2051 Last data filed at 07/03/2020 1120 Gross per 24 hour  Intake -  Output 1000 ml  Net -1000 ml   Weight change:  Exam:  General exam: Alert, awake, oriented x 3 Respiratory system: Crackles at bases.  Becomes short of breath in conversation Cardiovascular system:RRR. No murmurs, rubs, gallops. Gastrointestinal system: Abdomen is nondistended, soft and nontender. No organomegaly or masses felt. Normal bowel sounds heard. Central nervous system: Alert and oriented. No focal neurological deficits. Extremities: 1+ edema bilaterally Skin: No rashes, lesions or ulcers  Psychiatry: Judgement and insight appear normal. Mood  & affect appropriate.     Data Reviewed: I have personally reviewed following labs and imaging studies  Basic Metabolic Panel: Recent Labs  Lab 06/28/20 0531 06/30/20 0308 07/02/20 0442  NA 134* 131*  130* 132*  K 2.9* 3.4*  3.4* 3.8  CL 104 99  99 98  CO2 19* 21*  20* 25  GLUCOSE 149* 364*  366* 231*  BUN 26* 36*  36* 46*  CREATININE 1.68* 1.72*  1.68* 1.67*  CALCIUM 7.6* 7.8*  7.8* 8.2*  MG  --  1.9  --   PHOS  --  3.5  --    Liver Function Tests: Recent Labs  Lab 06/30/20 0308  ALBUMIN 2.1*   No results for input(s): LIPASE, AMYLASE in the last 168 hours. No results for input(s): AMMONIA in the last 168 hours. Coagulation Profile: No results for input(s): INR, PROTIME in the last 168 hours. CBC: Recent Labs  Lab 06/26/20 2235 06/28/20 0531 06/30/20 0308 07/02/20 0442  WBC  --  14.7* 9.5 16.4*  HGB 9.5* 8.9* 8.2* 8.7*  HCT 30.3* 28.9* 25.7* 28.3*  MCV  --  105.1* 103.6* 103.3*  PLT  --  147* 148* 172   Cardiac Enzymes: No results for input(s): CKTOTAL, CKMB, CKMBINDEX, TROPONINI in the last 168 hours. BNP: Invalid input(s): POCBNP CBG: Recent Labs  Lab 07/03/20 0501 07/03/20 0730 07/03/20 1117 07/03/20 1252 07/03/20 1609  GLUCAP 358* 401* 469* 431* 328*   HbA1C: No results for input(s): HGBA1C in the last 72 hours. Urine analysis:    Component Value Date/Time  COLORURINE COLORLESS (A) 06/06/2020 1151   APPEARANCEUR CLEAR 06/06/2020 1151   LABSPEC 1.013 06/06/2020 1151   PHURINE 5.0 06/06/2020 1151   GLUCOSEU >=500 (A) 06/06/2020 1151   HGBUR MODERATE (A) 06/06/2020 1151   BILIRUBINUR NEGATIVE 06/06/2020 1151   KETONESUR NEGATIVE 06/06/2020 1151   PROTEINUR NEGATIVE 06/06/2020 1151   NITRITE NEGATIVE 06/06/2020 1151   LEUKOCYTESUR NEGATIVE 06/06/2020 1151   Sepsis Labs: @LABRCNTIP (procalcitonin:4,lacticidven:4) ) Recent Results (from the past 240 hour(s))  Resp Panel by RT-PCR (Flu A&B, Covid) Nasopharyngeal Swab     Status:  None   Collection Time: 06/24/20 11:43 AM   Specimen: Nasopharyngeal Swab; Nasopharyngeal(NP) swabs in vial transport medium  Result Value Ref Range Status   SARS Coronavirus 2 by RT PCR NEGATIVE NEGATIVE Final    Comment: (NOTE) SARS-CoV-2 target nucleic acids are NOT DETECTED.  The SARS-CoV-2 RNA is generally detectable in upper respiratory specimens during the acute phase of infection. The lowest concentration of SARS-CoV-2 viral copies this assay can detect is 138 copies/mL. A negative result does not preclude SARS-Cov-2 infection and should not be used as the sole basis for treatment or other patient management decisions. A negative result may occur with  improper specimen collection/handling, submission of specimen other than nasopharyngeal swab, presence of viral mutation(s) within the areas targeted by this assay, and inadequate number of viral copies(<138 copies/mL). A negative result must be combined with clinical observations, patient history, and epidemiological information. The expected result is Negative.  Fact Sheet for Patients:  EntrepreneurPulse.com.au  Fact Sheet for Healthcare Providers:  IncredibleEmployment.be  This test is no t yet approved or cleared by the [place] FDA and  has been authorized for detection and/or diagnosis of SARS-CoV-2 by FDA under an Emergency Use Authorization (EUA). This EUA will remain  in effect (meaning this test can be used) for the duration of the COVID-19 declaration under Section 564(b)(1) of the Act, 21 U.S.C.section 360bbb-3(b)(1), unless the authorization is terminated  or revoked sooner.       Influenza A by PCR NEGATIVE NEGATIVE Final   Influenza B by PCR NEGATIVE NEGATIVE Final    Comment: (NOTE) The Xpert Xpress SARS-CoV-2/FLU/RSV plus assay is intended as an aid in the diagnosis of influenza from Nasopharyngeal swab specimens and should not be used as a sole basis for  treatment. Nasal washings and aspirates are unacceptable for Xpert Xpress SARS-CoV-2/FLU/RSV testing.  Fact Sheet for Patients: EntrepreneurPulse.com.au  Fact Sheet for Healthcare Providers: IncredibleEmployment.be  This test is not yet approved or cleared by the [place] FDA and has been authorized for detection and/or diagnosis of SARS-CoV-2 by FDA under an Emergency Use Authorization (EUA). This EUA will remain in effect (meaning this test can be used) for the duration of the COVID-19 declaration under Section 564(b)(1) of the Act, 21 U.S.C. section 360bbb-3(b)(1), unless the authorization is terminated or revoked.  Performed at [facility], [address] [place], [place] [address]   Blood culture (routine x 2)     Status: None   Collection Time: 06/24/20  2:04 PM   Specimen: Left Antecubital; Blood  Result Value Ref Range Status   Specimen Description   Final    LEFT ANTECUBITAL BOTTLES DRAWN AEROBIC AND ANAEROBIC   Special Requests Blood Culture adequate volume  Final   Culture   Final    NO GROWTH 5 DAYS Performed at [facility], [address], [place], [place] [address]    Report Status 06/29/2020 FINAL  Final  Blood culture (routine  x 2)     Status: None   Collection Time: 06/24/20  2:04 PM   Specimen: BLOOD LEFT HAND  Result Value Ref Range Status   Specimen Description   Final    BLOOD LEFT HAND BOTTLES DRAWN AEROBIC AND ANAEROBIC   Special Requests Blood Culture adequate volume  Final   Culture   Final    NO GROWTH 5 DAYS Performed at [facility], [address] [place], [place] [address]    Report Status 06/29/2020 FINAL  Final  MRSA PCR Screening     Status: None   Collection Time: 06/25/20  2:36 PM   Specimen: Nasopharyngeal  Result Value Ref Range Status   MRSA by PCR NEGATIVE NEGATIVE Final    Comment:        The GeneXpert MRSA Assay (FDA approved for NASAL specimens only), is one component of  a comprehensive MRSA colonization surveillance program. It is not intended to diagnose MRSA infection nor to guide or monitor treatment for MRSA infections. Performed at [facility], [address], [place], [place] [address]      Scheduled Meds: . atorvastatin  20 mg Oral Daily  . bumetanide (BUMEX) IV  1 mg Intravenous Q12H  . Chlorhexidine Gluconate Cloth  6 each Topical Daily  . diltiazem  360 mg Oral Daily  . feeding supplement (GLUCERNA SHAKE)  237 mL Oral TID  . hydrocortisone   Rectal BID  . insulin aspart  0-20 Units Subcutaneous TID WC  . insulin aspart  0-5 Units Subcutaneous QHS  . insulin aspart  5 Units Subcutaneous TID WC  . [START ON 07/04/2020] insulin detemir  20 Units Subcutaneous Daily  . mouth rinse  15 mL Mouth Rinse BID  . metoprolol tartrate  25 mg Oral BID  . pantoprazole  40 mg Oral BID AC  . predniSONE  40 mg Oral Q breakfast   Continuous Infusions:   Procedures/Studies: DG Elbow Complete Left  Result Date: 06/06/2020 CLINICAL DATA:  Pain following fall EXAM: LEFT ELBOW - COMPLETE 3+ VIEW COMPARISON:  None. FINDINGS: Frontal, lateral, and bilateral oblique views were obtained. No fracture, dislocation, or joint effusion. There is spurring along the medial distal humeral condyle. No appreciable joint space narrowing or erosion. IMPRESSION: Spurring along the medial distal humeral condyle. No appreciable joint space narrowing. No fracture or dislocation. Electronically Signed   By: Lowella Grip III M.D.   On: 06/06/2020 12:18   US RENAL  Result Date: 06/17/2020 CLINICAL DATA:  Chronic renal disease stage IV EXAM: RENAL / URINARY TRACT ULTRASOUND COMPLETE COMPARISON:  April 21, 2019 FINDINGS: Right Kidney: Renal measurements: 10.7 cm x 5.2 cm x 5.5 cm = volume: 162.9 mL. Echogenicity within normal limits. A 3.1 cm x 3.3 cm x 2.7 cm complex anechoic structure is seen within the mid right kidney. No hydronephrosis is visualized. Left Kidney: Renal  measurements: 12.3 cm x 7.2 cm x 6.1 cm = volume: 283.3 mL. Echogenicity within normal limits. 2.1 cm x 2.1 cm x 2.3 cm and 3.0 cm x 2.1 cm x 2.4 cm anechoic structures are seen within the mid and lower left kidney. An 8.5 mm shadowing echogenic focus is also seen within the lower pole of the left kidney. No hydronephrosis is visualized. Bladder: Appears normal for degree of bladder distention. Bilateral ureteral jets are visualized. Other: None. IMPRESSION: 1. Bilateral renal cysts, mildly increased in size when compared to the prior study. 2. Subcentimeter non-obstructing renal stone within the lower pole of the  left kidney. Electronically Signed   By: Virgina Norfolk M.D.   On: 06/17/2020 23:57   DG Chest Port 1 View  Result Date: 07/02/2020 CLINICAL DATA:  Pneumonitis. EXAM: PORTABLE CHEST 1 VIEW COMPARISON:  06/30/2020. FINDINGS: Cardiomegaly. Diffuse bilateral interstitial infiltrates again noted. Slight improvement from prior exam. No pleural effusion or pneumothorax. Thoracic spine scoliosis and degenerative change. IMPRESSION: 1. Cardiomegaly. 2. Diffuse bilateral interstitial infiltrates again noted. Slight improvement from prior exam. Electronically Signed   By: Marcello Moores  Register   On: 07/02/2020 07:30   DG CHEST PORT 1 VIEW  Result Date: 06/30/2020 CLINICAL DATA:  Sepsis, acute respiratory failure, pneumonia and pulmonary edema, acute on chronic CHF. EXAM: PORTABLE CHEST 1 VIEW COMPARISON:  Chest x-ray dated 06/27/2020 FINDINGS: Persistent bilateral airspace opacities, but slightly improved aeration of both lungs. No pleural effusion or pneumothorax is seen. Heart size and mediastinal contours are grossly stable. IMPRESSION: Slightly improved aeration of both lungs, but with persistent bilateral airspace opacities, compatible with multifocal pneumonia versus pulmonary edema. Electronically Signed   By: Franki Cabot M.D.   On: 06/30/2020 05:49   DG CHEST PORT 1 VIEW  Result Date:  06/27/2020 CLINICAL DATA:  Shortness of breath. EXAM: PORTABLE CHEST 1 VIEW COMPARISON:  06/24/2020.  CT abdomen 06/19/2014. FINDINGS: Stable cardiomegaly. Progressive diffuse severe bilateral pulmonary infiltrates/edema. No prominent pleural effusion. No pneumothorax. Stable calcific densities noted over the right upper quadrant, most likely dystrophic calcification. Degenerative change thoracic spine and both shoulders. IMPRESSION: 1. Stable cardiomegaly. 2. Progressive diffuse severe bilateral pulmonary infiltrates/edema. Electronically Signed   By: Marcello Moores  Register   On: 06/27/2020 05:56   DG Chest Port 1 View  Result Date: 06/24/2020 CLINICAL DATA:  Shortness of breath. EXAM: PORTABLE CHEST 1 VIEW COMPARISON:  Dec 12, 2019. FINDINGS: Stable cardiomediastinal silhouette. No pneumothorax or pleural effusion is noted. Bilateral patchy airspace opacities are now noted concerning for multifocal pneumonia. Bony thorax is unremarkable IMPRESSION: Bilateral patchy airspace opacities are noted concerning for multifocal pneumonia. Electronically Signed   By: Marijo Conception M.D.   On: 06/24/2020 11:37   DG Hip Unilat W or Wo Pelvis 2-3 Views Left  Result Date: 06/06/2020 CLINICAL DATA:  Left hip pain since a fall 06/03/2020. Initial encounter. EXAM: DG HIP (WITH OR WITHOUT PELVIS) 2-3V LEFT COMPARISON:  Plain films left hip 03/23/2020. FINDINGS: There is no acute bony or joint abnormality. Hip joint spaces are preserved. Scattered enthesopathic change noted. IMPRESSION: No acute abnormality. Electronically Signed   By: Inge Rise M.D.   On: 06/06/2020 12:18   US Abdomen Limited RUQ (LIVER/GB)  Result Date: 06/25/2020 CLINICAL DATA:  Elevated LFTs, post cholecystectomy EXAM: ULTRASOUND ABDOMEN LIMITED RIGHT UPPER QUADRANT COMPARISON:  September of 2018 CT evaluation FINDINGS: Gallbladder: Post cholecystectomy. Common bile duct: Diameter: 4.2 mm Liver: Mildly increased echogenicity of hepatic  parenchyma without visible lesion on limited images. Portal vein is patent on color Doppler imaging with normal direction of blood flow towards the liver. Other: RIGHT pleural effusion, may be moderate. IMPRESSION: 1. Hepatic steatosis, mild. 2. No biliary duct dilation post cholecystectomy. 3. RIGHT-sided pleural effusion partially imaged. Electronically Signed   By: Zetta Bills M.D.   On: 06/25/2020 15:07    Kathie Dike, MD  [facility]  If 7PM-7AM, please contact night-coverage [email or web address]  07/03/2020, 8:51 PM   LOS: 9 days

## 2020-07-03 NOTE — TOC Progression Note (Signed)
Transition of Care [facility]) - Progression Note    Patient Details  Name: BENITO LEMMERMAN MRN: [id number] Date of Birth: Jan 25, 1946  Transition of Care [facility]) CM/SW Contact  Salome Arnt, [place] Phone Number: 07/03/2020, 4:04 PM  Clinical Narrative:  Ann accepts bed offer at [facility]. Kerri at [facility] starting [place] today. [place] pending.      Expected Discharge Plan: [place] Barriers to Discharge: Continued Medical Work up  Expected Discharge Plan and Services Expected Discharge Plan: [place] In-house Referral: Clinical Social Work Discharge Planning Services: CM Consult   Living arrangements for the past 2 months: Single Family Home                 DME Arranged: N/A DME Agency: NA                   Social Determinants of Health (SDOH) Interventions    Readmission Risk Interventions No flowsheet data found.

## 2020-07-03 NOTE — TOC Progression Note (Signed)
Transition of Care [facility]) - Progression Note    Patient Details  Name: Jack Mejia MRN: [id number] Date of Birth: 09-Mar-1946  Transition of Care [facility]) CM/SW Contact  Salome Arnt, [place] Phone Number: 07/03/2020, 3:26 PM  Clinical Narrative:   LCSW met with pt to discuss SNF as recommended by PT. Pt requests that LCSW speak with his companion, Lelon Frohlich. Lelon Frohlich is agreeable to SNF and requests placement as close to [facility] as possible. Will initiate bed search and have SNF start auth when bed accepted. Will refer to outpatient palliative for follow up once SNF bed is chosen.     Expected Discharge Plan: [place] Barriers to Discharge: Continued Medical Work up  Expected Discharge Plan and Services Expected Discharge Plan: [place] In-house Referral: Clinical Social Work Discharge Planning Services: CM Consult   Living arrangements for the past 2 months: Single Family Home                 DME Arranged: N/A DME Agency: NA                   Social Determinants of Health (SDOH) Interventions    Readmission Risk Interventions No flowsheet data found.

## 2020-07-04 ENCOUNTER — Inpatient Hospital Stay (HOSPITAL_COMMUNITY): Payer: Medicare HMO

## 2020-07-04 LAB — BASIC METABOLIC PANEL
Anion gap: 8 (ref 5–15)
BUN: 51 mg/dL — ABNORMAL HIGH (ref 8–23)
CO2: 26 mmol/L (ref 22–32)
Calcium: 8.2 mg/dL — ABNORMAL LOW (ref 8.9–10.3)
Chloride: 97 mmol/L — ABNORMAL LOW (ref 98–111)
Creatinine, Ser: 1.74 mg/dL — ABNORMAL HIGH (ref 0.61–1.24)
GFR, Estimated: 41 mL/min — ABNORMAL LOW (ref >60)
Glucose, Bld: 257 mg/dL — ABNORMAL HIGH (ref 70–99)
Potassium: 3.9 mmol/L (ref 3.5–5.1)
Sodium: 131 mmol/L — ABNORMAL LOW (ref 135–145)

## 2020-07-04 LAB — CBC
HCT: 27.5 % — ABNORMAL LOW (ref 39.0–52.0)
Hemoglobin: 8.6 g/dL — ABNORMAL LOW (ref 13.0–17.0)
MCH: 32.1 pg (ref 26.0–34.0)
MCHC: 31.3 g/dL (ref 30.0–36.0)
MCV: 102.6 fL — ABNORMAL HIGH (ref 80.0–100.0)
Platelets: 174 10*3/uL (ref 150–400)
RBC: 2.68 MIL/uL — ABNORMAL LOW (ref 4.22–5.81)
RDW: 20 % — ABNORMAL HIGH (ref 11.5–15.5)
WBC: 27.5 10*3/uL — ABNORMAL HIGH (ref 4.0–10.5)
nRBC: 0 % (ref 0.0–0.2)

## 2020-07-04 LAB — GLUCOSE, CAPILLARY
Glucose-Capillary: 237 mg/dL — ABNORMAL HIGH (ref 70–99)
Glucose-Capillary: 232 mg/dL — ABNORMAL HIGH (ref 70–99)
Glucose-Capillary: 275 mg/dL — ABNORMAL HIGH (ref 70–99)
Glucose-Capillary: 339 mg/dL — ABNORMAL HIGH (ref 70–99)
Glucose-Capillary: 319 mg/dL — ABNORMAL HIGH (ref 70–99)

## 2020-07-04 MED ORDER — BISACODYL 5 MG PO TBEC
10.0000 mg | DELAYED_RELEASE_TABLET | Freq: Once | ORAL | Status: AC
  Administered 2020-07-04: 10 mg via ORAL
  Filled 2020-07-04: qty 2

## 2020-07-04 NOTE — Progress Notes (Signed)
Bipap is PRN order, not needed at this time. 

## 2020-07-04 NOTE — Progress Notes (Signed)
PT Cancellation Note  Patient Details Name: Jack Mejia MRN: [id number] DOB: 27-Oct-1945   Cancelled Treatment:    Reason Eval/Treat Not Completed: Patient declined, no reason specified;Other (comment) Attempted PT session.  Pt declined session due to close to lunch time.  Will attempt again later today if available.  Pt with call bell within reach, RN aware of status.  Ihor Austin, LPTA/CLT; CBIS [phone]  Aldona Lento 07/04/2020, 12:00 PM

## 2020-07-04 NOTE — Plan of Care (Signed)
  Problem: Education: Goal: Knowledge of General Education information will improve Description Including pain rating scale, medication(s)/side effects and non-pharmacologic comfort measures Outcome: Progressing   Problem: Health Behavior/Discharge Planning: Goal: Ability to manage health-related needs will improve Outcome: Progressing   

## 2020-07-04 NOTE — TOC Progression Note (Signed)
Transition of Care [facility]) - Progression Note    Patient Details  Name: ENGELBERT SEVIN MRN: [id number] Date of Birth: [birth year]  Transition of Care [facility]) CM/SW Contact  Natasha Bence, LCSW Phone Number: 07/04/2020, 1:42 PM  Clinical Narrative:    CSW provided updated MD progress note to [place] must. CSW received PASSR [id number] A. TOC to follow.   Expected Discharge Plan: [place] Barriers to Discharge: Continued Medical Work up  Expected Discharge Plan and Services Expected Discharge Plan: [place] In-house Referral: Clinical Social Work Discharge Planning Services: CM Consult   Living arrangements for the past 2 months: Single Family Home                 DME Arranged: N/A DME Agency: NA                   Social Determinants of Health (SDOH) Interventions    Readmission Risk Interventions No flowsheet data found.

## 2020-07-04 NOTE — Progress Notes (Signed)
PROGRESS NOTE  Jack Mejia TIW:[id number] DOB: Mar 09, 1946 DOA: 06/24/2020 PCP: Erven Colla, DO  Brief History:  74 y.o.year old malewith a history of paroxysmal atrial fibrillation on apixaban, CKD stage III, hypertension, diastolic CHF, hyperlipidemia, diabetes mellitus type 2,macrocytic anemia following with hematology s/p iron injection and receiving Retacrit, and adenomatous colon polyps in 2011who was admitted 06/24/20 with sepsis and acuterespiratory failurein the setting of pneumonia and pulmonary edema withacute on chronic diastolic CHF.  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 Melvyn Novas and Dr Halford Chessman -Stopped amiodarone -Treated with IV Solu-Medrol started 06/28/2020 through 07/02/2020,  -Transition to prednisone 40 mg daily on 12/8 with plans to decrease by 10 mg every 7 days -Oxygen has been weaned down to less than 2 L/min from 15 L just a few days ago No longer requiring BiPAP -Wean oxygen as tolerated for saturation greater 92% BC x 2  11/29 >>> RESP viral Panel  PCR 11/29 neg MRSA PCR   11/30  Neg Strep urinary ag  11/30 neg  Legionella urinary ag  11/30 >>>neg  -Overall significant improvement since starting steroids on 06/28/2020 -Repeat chest x-ray on 07/02/2020 shows improvement -  Acute on chronic diastolic CHF -Volume overload status-worsened with transfusion of PRBC -Improved with IV Bumex -Continues to have volume overload and shortness of breath -Continue Bumex twice daily -Plan will be to  discharge on p.o. torsemide as patient has tolerated this in the past -Daily weights -Accurate I's and O's -05/08/2020 echo EF 65 to 70%, no WMA, grade 1 DD, trivial MR, moderate AAS --BNP is elevated  DM2- last A1c 8.3 reflecting uncontrolled DM with hyperglycemia -NovoLog sliding scale -Holding glipizide -Blood sugars uncontrolled with hyperglycemia, likely related to steroids -Adjusting Levemir and adding NovoLog with meals  CAP/Lobar pneumonia -COVID-19 PCR negative -Patient completed ceftriaxone and doxycycline -Continue bronchodilators and mucolytics -Repeat chest x-ray from 07/02/2020 noted  Severe sepsis -Present on admission -Presented with leukocytosis and tachypnea -Secondary to pneumonia as above -Lactic acid peaked 3.4 -Completed antibiotics as above -Sepsis pathophysiology appears to have resolved  Acute on chronic renal failure CKD stage IIIb -Baseline creatinine 2.0-2.2 -Presented with serum creatinine 2.51 -Monitor closely with diuresis AKI----acute kidney injury on CKD stage - 3B--due to sepsis and hypoperfusion -  creatinine on admission=2.51  , baseline creatinine =2.0     , creatinine is now=1.7 ----renally adjust medications, avoid nephrotoxic agents / dehydration  / hypotension  Hematochezia/ABLA -GI consult -baseline Hgb 9-10 -presented with Hgb 7.5 -Received a total of 2 units of PRBC this admission -Follow-up hemoglobin improved to 9 and has been fluctuating between 8 and 9 -GI consult appreciated -Patient's cardiopulmonary  status precludes endoluminal evaluation -Advance to GI soft diet  Paroxysmal Atrial Fibrillation -holding apixaban due to hematochezia -Continue Cardizem CD Stopped amiodarone due to concerns about possible amiodarone induced pneumonitis  Essential Hypertension--stable -Continue diltiazem CD -Continue metoprolol heart rate  Hyperlipidemia -continue statin  Hypokalemia/Hypomagnesemia--- replace and recheck,  electrolytes abnormalities may persist due to high-dose diuretic use  Social/ethics--plan of care, advanced directives and goals of care discussed with patient and patient's 2 daughters Jack Mejia and Jack Mejia -  Palliative care consult appreciated patient is a DNR/DNI with full scope of treatment  Disposition--- seen by physical therapy with recommendations for skilled nursing facility placement  Status is: Inpatient  Remains inpatient appropriate because:Acute hypoxic respiratory failure requiring IV diuretic and IV steroids   Dispo: The patient is from: Home              Anticipated d/c is to: SNF              Anticipated d/c date is: 2 days              Patient currently is not medically stable to d/c.  Family Communication: -Discussed with patient  Consultants:  Pulm  Code Status:    Code Status: DNR  DVT Prophylaxis: SCDs  Procedures: As Listed in Progress Note Above  Antibiotics: Rocephin/doxy-completed  Subjective: Still has some shortness of breath, cough.  No chest pain  Objective: Vitals:   07/04/20 0735 07/04/20 0800 07/04/20 0900 07/04/20 1000  BP:      Pulse: 64 60 69 62  Resp: (!) 23 (!) 24 16 (!) 24  Temp: 97.9 F (36.6 C)     TempSrc: Oral     SpO2: 99% 100% 99% 96%  Weight:      Height:        Intake/Output Summary (Last 24 hours) at 07/04/2020 1941 Last data filed at 07/04/2020 0800 Gross per 24 hour  Intake --  Output 800 ml  Net -800 ml   Weight change:  Exam:  General exam: Alert, awake, oriented x 3 Respiratory system: Crackles at bases.  Becomes short of breath in conversation Cardiovascular system:RRR. No murmurs, rubs, gallops. Gastrointestinal system: Abdomen is nondistended, soft and nontender. No organomegaly or masses felt. Normal bowel sounds heard. Central nervous system: Alert and oriented. No focal neurological deficits. Extremities: Edema in upper extremities, right greater than left Skin: No rashes, lesions or  ulcers  Psychiatry: Judgement and insight appear normal. Mood & affect appropriate.     Data Reviewed: I have personally reviewed following labs and imaging studies  Basic Metabolic Panel: Recent Labs  Lab 06/28/20 0531 06/30/20 0308 07/02/20 0442 07/04/20 0433  NA 134* 131*  130* 132* 131*  K 2.9* 3.4*  3.4* 3.8 3.9  CL 104 99  99 98 97*  CO2 19* 21*  20* 25 26  GLUCOSE 149* 364*  366* 231* 257*  BUN 26* 36*  36* 46* 51*  CREATININE 1.68* 1.72*  1.68* 1.67* 1.74*  CALCIUM 7.6* 7.8*  7.8* 8.2* 8.2*  MG  --  1.9  --   --   PHOS  --  3.5  --   --    Liver Function Tests: Recent Labs  Lab 06/30/20 0308  ALBUMIN 2.1*   No results for input(s): LIPASE, AMYLASE in the last 168 hours. No results for input(s): AMMONIA in the last 168 hours. Coagulation Profile: No results for input(s): INR, PROTIME in the last 168 hours. CBC: Recent Labs  Lab 06/28/20 0531 06/30/20 0308 07/02/20 0442 07/04/20 0433  WBC 14.7* 9.5 16.4* 27.5*  HGB 8.9* 8.2* 8.7* 8.6*  HCT 28.9* 25.7* 28.3* 27.5*  MCV 105.1* 103.6* 103.3* 102.6*  PLT 147* 148* 172 174   Cardiac Enzymes: No results for input(s): CKTOTAL, CKMB, CKMBINDEX, TROPONINI in the last 168 hours. BNP: Invalid input(s): POCBNP CBG: Recent Labs  Lab 07/03/20 2114 07/04/20 0433 07/04/20 0731 07/04/20 1140 07/04/20 1730  GLUCAP 262* 237* 232* 275* 339*   HbA1C:  No results for input(s): HGBA1C in the last 72 hours. Urine analysis:    Component Value Date/Time   COLORURINE COLORLESS (A) 06/06/2020 1151   APPEARANCEUR CLEAR 06/06/2020 1151   LABSPEC 1.013 06/06/2020 1151   PHURINE 5.0 06/06/2020 1151   GLUCOSEU >=500 (A) 06/06/2020 1151   HGBUR MODERATE (A) 06/06/2020 1151   BILIRUBINUR NEGATIVE 06/06/2020 1151   KETONESUR NEGATIVE 06/06/2020 1151   PROTEINUR NEGATIVE 06/06/2020 1151   NITRITE NEGATIVE 06/06/2020 1151   LEUKOCYTESUR NEGATIVE 06/06/2020 1151   Sepsis  Labs: @LABRCNTIP (procalcitonin:4,lacticidven:4) ) Recent Results (from the past 240 hour(s))  MRSA PCR Screening     Status: None   Collection Time: 06/25/20  2:36 PM   Specimen: Nasopharyngeal  Result Value Ref Range Status   MRSA by PCR NEGATIVE NEGATIVE Final    Comment:        The GeneXpert MRSA Assay (FDA approved for NASAL specimens only), is one component of a comprehensive MRSA colonization surveillance program. It is not intended to diagnose MRSA infection nor to guide or monitor treatment for MRSA infections. Performed at [facility], [address] [place], [place] [address]      Scheduled Meds: . atorvastatin  20 mg Oral Daily  . bumetanide (BUMEX) IV  1 mg Intravenous Q12H  . Chlorhexidine Gluconate Cloth  6 each Topical Daily  . diltiazem  360 mg Oral Daily  . feeding supplement (GLUCERNA SHAKE)  237 mL Oral TID  . hydrocortisone   Rectal BID  . insulin aspart  0-20 Units Subcutaneous TID WC  . insulin aspart  0-5 Units Subcutaneous QHS  . insulin aspart  5 Units Subcutaneous TID WC  . insulin detemir  20 Units Subcutaneous Daily  . mouth rinse  15 mL Mouth Rinse BID  . metoprolol tartrate  25 mg Oral BID  . pantoprazole  40 mg Oral BID AC  . predniSONE  40 mg Oral Q breakfast   Continuous Infusions:   Procedures/Studies: DG Elbow Complete Left  Result Date: 06/06/2020 CLINICAL DATA:  Pain following fall EXAM: LEFT ELBOW - COMPLETE 3+ VIEW COMPARISON:  None. FINDINGS: Frontal, lateral, and bilateral oblique views were obtained. No fracture, dislocation, or joint effusion. There is spurring along the medial distal humeral condyle. No appreciable joint space narrowing or erosion. IMPRESSION: Spurring along the medial distal humeral condyle. No appreciable joint space narrowing. No fracture or dislocation. Electronically Signed   By: Lowella Grip III M.D.   On: 06/06/2020 12:18   US RENAL  Result Date: 06/17/2020 CLINICAL DATA:  Chronic renal  disease stage IV EXAM: RENAL / URINARY TRACT ULTRASOUND COMPLETE COMPARISON:  April 21, 2019 FINDINGS: Right Kidney: Renal measurements: 10.7 cm x 5.2 cm x 5.5 cm = volume: 162.9 mL. Echogenicity within normal limits. A 3.1 cm x 3.3 cm x 2.7 cm complex anechoic structure is seen within the mid right kidney. No hydronephrosis is visualized. Left Kidney: Renal measurements: 12.3 cm x 7.2 cm x 6.1 cm = volume: 283.3 mL. Echogenicity within normal limits. 2.1 cm x 2.1 cm x 2.3 cm and 3.0 cm x 2.1 cm x 2.4 cm anechoic structures are seen within the mid and lower left kidney. An 8.5 mm shadowing echogenic focus is also seen within the lower pole of the left kidney. No hydronephrosis is visualized. Bladder: Appears normal for degree of bladder distention. Bilateral ureteral jets are visualized. Other: None. IMPRESSION: 1. Bilateral renal cysts, mildly increased in size when compared to the prior study. 2. Subcentimeter non-obstructing  renal stone within the lower pole of the left kidney. Electronically Signed   By: Virgina Norfolk M.D.   On: 06/17/2020 23:57   US Venous Img Upper Uni Right(DVT)  Result Date: 07/04/2020 CLINICAL DATA:  Right upper extremity edema. History of diabetes. Former smoker. Evaluate for DVT. EXAM: RIGHT UPPER EXTREMITY VENOUS DOPPLER ULTRASOUND TECHNIQUE: Gray-scale sonography with graded compression, as well as color Doppler and duplex ultrasound were performed to evaluate the upper extremity deep venous system from the level of the subclavian vein and including the jugular, axillary, basilic, radial, ulnar and upper cephalic vein. Spectral Doppler was utilized to evaluate flow at rest and with distal augmentation maneuvers. COMPARISON:  None. FINDINGS: Contralateral Subclavian Vein: Respiratory phasicity is normal and symmetric with the symptomatic side. No evidence of thrombus. Normal compressibility. Internal Jugular Vein: No evidence of thrombus. Normal compressibility, respiratory  phasicity and response to augmentation. Subclavian Vein: No evidence of thrombus. Normal compressibility, respiratory phasicity and response to augmentation. Axillary Vein: No evidence of thrombus. Normal compressibility, respiratory phasicity and response to augmentation. Cephalic Vein: No evidence of thrombus. Normal compressibility, respiratory phasicity and response to augmentation. Basilic Vein: Not well visualized. Brachial Veins: No evidence of thrombus. Normal compressibility, respiratory phasicity and response to augmentation. Radial Veins: No evidence of thrombus. Normal compressibility, respiratory phasicity and response to augmentation. Ulnar Veins: No evidence of thrombus. Normal compressibility, respiratory phasicity and response to augmentation. Venous Reflux:  None visualized. Other Findings: There is a large amount of subcutaneous edema at the level of the right forearm (images 34 through 38). IMPRESSION: 1. No evidence of DVT within the right upper extremity. 2. Large amount of subcutaneous edema at the level of the right forearm Electronically Signed   By: Sandi Mariscal M.D.   On: 07/04/2020 15:16   DG Chest Port 1 View  Result Date: 07/02/2020 CLINICAL DATA:  Pneumonitis. EXAM: PORTABLE CHEST 1 VIEW COMPARISON:  06/30/2020. FINDINGS: Cardiomegaly. Diffuse bilateral interstitial infiltrates again noted. Slight improvement from prior exam. No pleural effusion or pneumothorax. Thoracic spine scoliosis and degenerative change. IMPRESSION: 1. Cardiomegaly. 2. Diffuse bilateral interstitial infiltrates again noted. Slight improvement from prior exam. Electronically Signed   By: Marcello Moores  Register   On: 07/02/2020 07:30   DG CHEST PORT 1 VIEW  Result Date: 06/30/2020 CLINICAL DATA:  Sepsis, acute respiratory failure, pneumonia and pulmonary edema, acute on chronic CHF. EXAM: PORTABLE CHEST 1 VIEW COMPARISON:  Chest x-ray dated 06/27/2020 FINDINGS: Persistent bilateral airspace opacities, but  slightly improved aeration of both lungs. No pleural effusion or pneumothorax is seen. Heart size and mediastinal contours are grossly stable. IMPRESSION: Slightly improved aeration of both lungs, but with persistent bilateral airspace opacities, compatible with multifocal pneumonia versus pulmonary edema. Electronically Signed   By: Franki Cabot M.D.   On: 06/30/2020 05:49   DG CHEST PORT 1 VIEW  Result Date: 06/27/2020 CLINICAL DATA:  Shortness of breath. EXAM: PORTABLE CHEST 1 VIEW COMPARISON:  06/24/2020.  CT abdomen 06/19/2014. FINDINGS: Stable cardiomegaly. Progressive diffuse severe bilateral pulmonary infiltrates/edema. No prominent pleural effusion. No pneumothorax. Stable calcific densities noted over the right upper quadrant, most likely dystrophic calcification. Degenerative change thoracic spine and both shoulders. IMPRESSION: 1. Stable cardiomegaly. 2. Progressive diffuse severe bilateral pulmonary infiltrates/edema. Electronically Signed   By: Marcello Moores  Register   On: 06/27/2020 05:56   DG Chest Port 1 View  Result Date: 06/24/2020 CLINICAL DATA:  Shortness of breath. EXAM: PORTABLE CHEST 1 VIEW COMPARISON:  Dec 12, 2019. FINDINGS: Stable cardiomediastinal silhouette.  No pneumothorax or pleural effusion is noted. Bilateral patchy airspace opacities are now noted concerning for multifocal pneumonia. Bony thorax is unremarkable IMPRESSION: Bilateral patchy airspace opacities are noted concerning for multifocal pneumonia. Electronically Signed   By: Marijo Conception M.D.   On: 06/24/2020 11:37   DG Hip Unilat W or Wo Pelvis 2-3 Views Left  Result Date: 06/06/2020 CLINICAL DATA:  Left hip pain since a fall 06/03/2020. Initial encounter. EXAM: DG HIP (WITH OR WITHOUT PELVIS) 2-3V LEFT COMPARISON:  Plain films left hip 03/23/2020. FINDINGS: There is no acute bony or joint abnormality. Hip joint spaces are preserved. Scattered enthesopathic change noted. IMPRESSION: No acute abnormality.  Electronically Signed   By: Inge Rise M.D.   On: 06/06/2020 12:18   US Abdomen Limited RUQ (LIVER/GB)  Result Date: 06/25/2020 CLINICAL DATA:  Elevated LFTs, post cholecystectomy EXAM: ULTRASOUND ABDOMEN LIMITED RIGHT UPPER QUADRANT COMPARISON:  September of 2018 CT evaluation FINDINGS: Gallbladder: Post cholecystectomy. Common bile duct: Diameter: 4.2 mm Liver: Mildly increased echogenicity of hepatic parenchyma without visible lesion on limited images. Portal vein is patent on color Doppler imaging with normal direction of blood flow towards the liver. Other: RIGHT pleural effusion, may be moderate. IMPRESSION: 1. Hepatic steatosis, mild. 2. No biliary duct dilation post cholecystectomy. 3. RIGHT-sided pleural effusion partially imaged. Electronically Signed   By: Zetta Bills M.D.   On: 06/25/2020 15:07    Kathie Dike, MD  [facility]  If 7PM-7AM, please contact night-coverage [email or web address]  07/04/2020, 7:41 PM   LOS: 10 days

## 2020-07-04 NOTE — Progress Notes (Signed)
Inpatient Diabetes Program Recommendations  AACE/ADA: New Consensus Statement on Inpatient Glycemic Control   Target Ranges:  Prepandial:   less than 140 mg/dL      Peak postprandial:   less than 180 mg/dL (1-2 hours)      Critically ill patients:  140 - 180 mg/dL   Results for Jack Mejia, Jack Mejia (MRN [id number]) as of 07/04/2020 08:15  Ref. Range 07/03/2020 07:30 07/03/2020 11:17 07/03/2020 12:52 07/03/2020 16:09 07/03/2020 21:14 07/04/2020 04:33 07/04/2020 07:31  Glucose-Capillary Latest Ref Range: 70 - 99 mg/dL 401 (H) 469 (H) 431 (H) 328 (H) 262 (H) 237 (H) 232 (H)   Review of Glycemic Control  Diabetes history:DM2 Outpatient Diabetes medications:Jardiance 10 mg QAM, Glipizide 10 mg BID Current orders for Inpatient glycemic control:Levemir 20 units daily,Novolog 0-20units TID, Novolog 0-5 units QHS, Novolog 5 units TID with meals; Prednisone 40 mg QAM  Inpatient Diabetes Program Recommendations:  Insulin: If steroids continued as ordered, please consider increasing Levemir to 25 units daily and meal coverage to Novolog 8 units TID with meals.  Thanks, Barnie Alderman, RN, MSN, CDE Diabetes Coordinator Inpatient Diabetes Program [phone] (Team Pager from 8am to 5pm)

## 2020-07-05 LAB — GLUCOSE, CAPILLARY
Glucose-Capillary: 115 mg/dL — ABNORMAL HIGH (ref 70–99)
Glucose-Capillary: 251 mg/dL — ABNORMAL HIGH (ref 70–99)
Glucose-Capillary: 446 mg/dL — ABNORMAL HIGH (ref 70–99)
Glucose-Capillary: 426 mg/dL — ABNORMAL HIGH (ref 70–99)
Glucose-Capillary: 289 mg/dL — ABNORMAL HIGH (ref 70–99)

## 2020-07-05 LAB — BASIC METABOLIC PANEL
Anion gap: 9 (ref 5–15)
BUN: 50 mg/dL — ABNORMAL HIGH (ref 8–23)
CO2: 28 mmol/L (ref 22–32)
Calcium: 8.2 mg/dL — ABNORMAL LOW (ref 8.9–10.3)
Chloride: 97 mmol/L — ABNORMAL LOW (ref 98–111)
Creatinine, Ser: 1.52 mg/dL — ABNORMAL HIGH (ref 0.61–1.24)
GFR, Estimated: 48 mL/min — ABNORMAL LOW (ref >60)
Glucose, Bld: 136 mg/dL — ABNORMAL HIGH (ref 70–99)
Potassium: 3.6 mmol/L (ref 3.5–5.1)
Sodium: 134 mmol/L — ABNORMAL LOW (ref 135–145)

## 2020-07-05 MED ORDER — INSULIN ASPART 100 UNIT/ML ~~LOC~~ SOLN
25.0000 [IU] | Freq: Once | SUBCUTANEOUS | Status: AC
  Administered 2020-07-05: 25 [IU] via SUBCUTANEOUS

## 2020-07-05 MED ORDER — INSULIN ASPART 100 UNIT/ML ~~LOC~~ SOLN
10.0000 [IU] | Freq: Three times a day (TID) | SUBCUTANEOUS | Status: DC
  Administered 2020-07-06 (×3): 10 [IU] via SUBCUTANEOUS

## 2020-07-05 MED ORDER — INSULIN ASPART 100 UNIT/ML ~~LOC~~ SOLN: 15.0000 [IU] | Freq: Three times a day (TID) | SUBCUTANEOUS | Status: DC

## 2020-07-05 MED ORDER — INSULIN ASPART 100 UNIT/ML ~~LOC~~ SOLN
20.0000 [IU] | Freq: Once | SUBCUTANEOUS | Status: AC
  Administered 2020-07-05: 20 [IU] via SUBCUTANEOUS

## 2020-07-05 NOTE — Progress Notes (Signed)
Physical Therapy Treatment Patient Details Name: [name] MRN: [id number] DOB: 11-Aug-1945 Today's Date: 07/05/2020    History of Present Illness Jack Mejia is a 74 y.o. male with medical history significant for paroxysmal atrial fibrillation on anticoagulation, CHF,, CKD 3, hypertension.  History is obtained from chart review and EDP as at the time of my evaluation patient is on BiPAP.Patient presented to the ED with complaints of difficulty breathing over the past month that significantly worsened over the past week.  Patient also has swelling in his lower extremities but this is not new.  He has been taking Demadex at home.  He also noticed blood in his depends, but he denied blood in his urine.    PT Comments    Improved participation with PT requests throughout session, stating he wanted to get stronger and go home. Cont pain in buttox impacting performance in bed mobility and sit to/from stand. Require MinA-ModA to stand from EOB., and once standing immediate request for bathroom, resulting in ambulation to toilet approx 7 ft away using 2WRW and MinA-MinA guard, and guard stand to sit transfer on toilet where PT waited with door propped as patient completed bowel movement. Transition to stand with MinA-ModA with 2WRW and PT assist in cleaning. Patient requested to continue walking which he did with MinA and 2WRW throughout room while on 2L O2 with SpO2 between 92-97. Completed Therapeutic exercises after request for improving strength, demo increased fatigue and report increased pain in hip flexion (marching) exercises. Patient will benefit from continued physical therapy in hospital and recommended venue below to increase strength, balance, endurance for safe ADLs and gait.  Follow Up Recommendations  SNF;Supervision for mobility/OOB;Supervision - Intermittent     Equipment Recommendations  None recommended by PT    Recommendations for Other Services       Precautions /  Restrictions Precautions Precautions: Fall Restrictions Weight Bearing Restrictions: No    Mobility  Bed Mobility Overal bed mobility: Needs Assistance Bed Mobility: Supine to Sit     Supine to sit: Supervision;Min guard;HOB elevated        Transfers Overall transfer level: Needs assistance Equipment used: Rolling walker (2 wheeled) Transfers: Sit to/from [facility] Sit to Stand: Mod assist Stand pivot transfers: Min assist;Mod assist       General transfer comment: labored movement, decreased eccentric control  Ambulation/Gait Ambulation/Gait assistance: Min guard;Min assist Gait Distance (Feet): 15 Feet Assistive device: Rolling walker (2 wheeled) Gait Pattern/deviations: Step-through pattern;Decreased step length - right;Decreased step length - left;Decreased stride length;Shuffle Gait velocity: decreased   General Gait Details: labored, increased c/o buttox pain   Stairs             Wheelchair Mobility    Modified Rankin (Stroke Patients Only)       Balance Overall balance assessment: Mild deficits observed, not formally tested Sitting-balance support: Bilateral upper extremity supported;Feet supported Sitting balance-Leahy Scale: Fair     Standing balance support: During functional activity Standing balance-Leahy Scale: Fair Standing balance comment: with RW                            Cognition Arousal/Alertness: Awake/alert Behavior During Therapy: WFL for tasks assessed/performed Overall Cognitive Status: Within Functional Limits for tasks assessed  Exercises Total Joint Exercises Ankle Circles/Pumps: AROM;Strengthening;Both;15 reps;Seated Knee Flexion: AROM;Strengthening;Both;15 reps;Seated General Exercises - Lower Extremity Long Arc Quad: AROM;Strengthening;Both;15 reps;Seated Toe Raises: AROM;Strengthening;Both;15 reps Heel Raises:  AROM;Strengthening;Both;15 reps    General Comments        Pertinent Vitals/Pain Pain Assessment: Faces Faces Pain Scale: Hurts a little bit Pain Location: bottom Pain Descriptors / Indicators: Sore Pain Intervention(s): Limited activity within patient's tolerance;Monitored during session    Home Living                      Prior Function            PT Goals (current goals can now be found in the care plan section) Acute Rehab PT Goals Patient Stated Goal: return after rehab PT Goal Formulation: With patient Time For Goal Achievement: 07/17/20 Potential to Achieve Goals: Good Progress towards PT goals: Progressing toward goals    Frequency    Min 3X/week      PT Plan Current plan remains appropriate    Co-evaluation              AM-PAC PT "6 Clicks" Mobility   Outcome Measure  Help needed turning from your back to your side while in a flat bed without using bedrails?: None Help needed moving from lying on your back to sitting on the side of a flat bed without using bedrails?: A Little Help needed moving to and from a bed to a chair (including a wheelchair)?: A Little Help needed standing up from a chair using your arms (e.g., wheelchair or bedside chair)?: A Lot Help needed to walk in hospital room?: A Little Help needed climbing 3-5 steps with a railing? : A Lot 6 Click Score: 17    End of Session Equipment Utilized During Treatment: Oxygen Activity Tolerance: Patient tolerated treatment well;Patient limited by fatigue Patient left: in chair;with call bell/phone within reach Nurse Communication: Mobility status PT Visit Diagnosis: Unsteadiness on feet (R26.81);Other abnormalities of gait and mobility (R26.89);Muscle weakness (generalized) (M62.81)     Time: 8588-5027 PT Time Calculation (min) (ACUTE ONLY): 26 min  Charges:  $Therapeutic Exercise: 8-22 mins $Therapeutic Activity: 8-22 mins                     3:22 PM,07/05/20 Domenic Moras, PT, DPT Physical Therapist at [facility]

## 2020-07-05 NOTE — Care Management Important Message (Signed)
Important Message  Patient Details  Name: Jack Mejia MRN: [id number] Date of Birth: [birth year]   Medicare Important Message Given:  Yes     Tommy Medal 07/05/2020, 4:03 PM

## 2020-07-05 NOTE — Progress Notes (Signed)
Patient blood glucose before dinner was 446. Notified Dr. Roderic Palau and orders received to give 25 units of novolog + 5 units for his mealtime coverage. A total of 30 units administered to patient. Re-checked in 1 hour and blood glucose 426. Notified Dr. Roderic Palau and received orders to give an additional 20 units of novolog. Administered per orders. Will monitor.

## 2020-07-05 NOTE — Progress Notes (Signed)
PROGRESS NOTE  Jack Mejia NAT:[id number] DOB: October 31, 1945 DOA: 06/24/2020 PCP: Erven Colla, DO  Brief History:  74 y.o.year old malewith a history of paroxysmal atrial fibrillation on apixaban, CKD stage III, hypertension, diastolic CHF, hyperlipidemia, diabetes mellitus type 2,macrocytic anemia following with hematology s/p iron injection and receiving Retacrit, and adenomatous colon polyps in 2011who was admitted 06/24/20 with sepsis and acuterespiratory failurein the setting of pneumonia and pulmonary edema withacute on chronic diastolic CHF.  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 Melvyn Novas and Dr Halford Chessman -Stopped amiodarone -Treated with IV Solu-Medrol started 06/28/2020 through 07/02/2020,  -Transition to prednisone 40 mg daily on 12/8 with plans to decrease by 10 mg every 7 days -Oxygen has been weaned down to less than 2 L/min from 15 L just a few days ago No longer requiring BiPAP -Wean oxygen as tolerated for saturation greater 92% BC x 2  11/29 >>> RESP viral Panel  PCR 11/29 neg MRSA PCR   11/30  Neg Strep urinary ag  11/30 neg  Legionella urinary ag  11/30 >>>neg  -Overall significant improvement since starting steroids on 06/28/2020 -Repeat chest x-ray on 07/02/2020 shows improvement -  Acute on chronic diastolic CHF -Volume overload status-worsened with transfusion of PRBC -Continues to have volume overload and shortness of breath -Continue Bumex twice daily -Urine output has been fair with 2700 cc of  urine made yesterday -Plan will be to discharge on p.o. torsemide as patient has tolerated this in the past -Daily weights -Accurate I's and O's -05/08/2020 echo EF 65 to 70%, no WMA, grade 1 DD, trivial MR, moderate AAS --BNP is elevated  DM2- last A1c 8.3 reflecting uncontrolled DM with hyperglycemia -NovoLog sliding scale -Holding glipizide -Blood sugars uncontrolled with hyperglycemia, likely related to steroids -Adjusting Levemir and NovoLog with meals  CAP/Lobar pneumonia -COVID-19 PCR negative -Patient completed ceftriaxone and doxycycline -Continue bronchodilators and mucolytics -Repeat chest x-ray from 07/02/2020 noted  Severe sepsis -Present on admission -Presented with leukocytosis and tachypnea -Secondary to pneumonia as above -Lactic acid peaked 3.4 -Completed antibiotics as above -Sepsis pathophysiology appears to have resolved  Acute on chronic renal failure CKD stage IIIb -Baseline creatinine 2.0-2.2 -Presented with serum creatinine 2.51 -Monitor closely with diuresis AKI----acute kidney injury on CKD stage - 3B--due to sepsis and hypoperfusion -  creatinine on admission=2.51  , baseline creatinine =2.0     , creatinine is now=1.5 ----renally adjust medications, avoid nephrotoxic agents / dehydration  / hypotension  Hematochezia/ABLA -GI consult -baseline Hgb 9-10 -presented with Hgb 7.5 -Received a total of 2 units of PRBC this admission -Follow-up hemoglobin improved to 9 and has been fluctuating between 8 and 9 -GI consult appreciated -Patient's cardiopulmonary  status precludes endoluminal evaluation -Advance to GI soft diet  Paroxysmal Atrial Fibrillation -holding apixaban due to hematochezia -Continue Cardizem CD Stopped amiodarone due to concerns about possible amiodarone induced pneumonitis  Essential Hypertension--stable -Continue diltiazem CD -Continue metoprolol heart rate  Hyperlipidemia -continue  statin  Hypokalemia/Hypomagnesemia--- replace and recheck, electrolytes abnormalities may persist due to high-dose diuretic use  Social/ethics--plan of care, advanced directives and goals of care discussed with patient  and patient's 2 daughters [name] and [name] -Palliative care consult appreciated patient is a DNR/DNI with full scope of treatment  Disposition--- seen by physical therapy with recommendations for skilled nursing facility placement  Status is: Inpatient  Remains inpatient appropriate because:Acute hypoxic respiratory failure requiring IV diuretic and IV steroids   Dispo: The patient is from: Home              Anticipated d/c is to: SNF              Anticipated d/c date is: 2 days              Patient currently is not medically stable to d/c.  Family Communication: -Discussed with patient and daughter [name]  Consultants:  Pulm  Code Status:    Code Status: DNR  DVT Prophylaxis: SCDs  Procedures: As Listed in Progress Note Above  Antibiotics: Rocephin/doxy-completed  Subjective: Continues to feel short of breath.  Feels tired today.  Did not rest well last night.  Objective: Vitals:   07/04/20 2204 07/05/20 0440 07/05/20 0925 07/05/20 1400  BP: (!) 146/65 (!) 160/65  137/60  Pulse: 67 64 65 63  Resp: 18 19  18   Temp: 97.7 F (36.5 C) 98.2 F (36.8 C)  98 F (36.7 C)  TempSrc:    Oral  SpO2: 96% 92% 95% 98%  Weight:      Height:        Intake/Output Summary (Last 24 hours) at 07/05/2020 1710 Last data filed at 07/05/2020 1132 Gross per 24 hour  Intake 240 ml  Output 2850 ml  Net -2610 ml   Weight change:  Exam:  General exam: Alert, awake, oriented x 3 Respiratory system: crackles at bases. Respiratory effort normal. Cardiovascular system:RRR. No murmurs, rubs, gallops. Gastrointestinal system: Abdomen is nondistended, soft and nontender. No organomegaly or masses felt. Normal bowel sounds heard. Central nervous system: Alert and  oriented. No focal neurological deficits. Extremities: 1+ edema bilaterally Skin: No rashes, lesions or ulcers  Psychiatry: Judgement and insight appear normal. Mood & affect appropriate.       Data Reviewed: I have personally reviewed following labs and imaging studies  Basic Metabolic Panel: Recent Labs  Lab 06/30/20 0308 07/02/20 0442 07/04/20 0433 07/05/20 0615  NA 131*  130* 132* 131* 134*  K 3.4*  3.4* 3.8 3.9 3.6  CL 99  99 98 97* 97*  CO2 21*  20* 25 26 28   GLUCOSE 364*  366* 231* 257* 136*  BUN 36*  36* 46* 51* 50*  CREATININE 1.72*  1.68* 1.67* 1.74* 1.52*  CALCIUM 7.8*  7.8* 8.2* 8.2* 8.2*  MG 1.9  --   --   --   PHOS 3.5  --   --   --    Liver Function Tests: Recent Labs  Lab 06/30/20 0308  ALBUMIN 2.1*   No results for input(s): LIPASE, AMYLASE in the last 168 hours. No results for input(s): AMMONIA in the last 168 hours. Coagulation Profile: No results for input(s): INR, PROTIME in the last 168 hours. CBC: Recent Labs  Lab 06/30/20 0308 07/02/20 0442 07/04/20 0433  WBC 9.5 16.4* 27.5*  HGB 8.2* 8.7* 8.6*  HCT 25.7* 28.3* 27.5*  MCV 103.6* 103.3* 102.6*  PLT 148* 172 174   Cardiac Enzymes: No results for input(s): CKTOTAL, CKMB, CKMBINDEX, TROPONINI in the last 168 hours. BNP: Invalid input(s): POCBNP CBG: Recent Labs  Lab 07/04/20 1730 07/04/20 2205 07/05/20 0748 07/05/20 1124 07/05/20 1643  GLUCAP  339* 319* 115* 251* 446*   HbA1C: No results for input(s): HGBA1C in the last 72 hours. Urine analysis:    Component Value Date/Time   COLORURINE COLORLESS (A) 06/06/2020 1151   APPEARANCEUR CLEAR 06/06/2020 1151   LABSPEC 1.013 06/06/2020 1151   PHURINE 5.0 06/06/2020 1151   GLUCOSEU >=500 (A) 06/06/2020 1151   HGBUR MODERATE (A) 06/06/2020 1151   BILIRUBINUR NEGATIVE 06/06/2020 1151   KETONESUR NEGATIVE 06/06/2020 1151   PROTEINUR NEGATIVE 06/06/2020 1151   NITRITE NEGATIVE 06/06/2020 1151   LEUKOCYTESUR NEGATIVE  06/06/2020 1151   Sepsis Labs: @LABRCNTIP (procalcitonin:4,lacticidven:4) ) No results found for this or any previous visit (from the past 240 hour(s)).   Scheduled Meds: . atorvastatin  20 mg Oral Daily  . bumetanide (BUMEX) IV  1 mg Intravenous Q12H  . Chlorhexidine Gluconate Cloth  6 each Topical Daily  . diltiazem  360 mg Oral Daily  . feeding supplement (GLUCERNA SHAKE)  237 mL Oral TID  . hydrocortisone   Rectal BID  . insulin aspart  0-20 Units Subcutaneous TID WC  . insulin aspart  0-5 Units Subcutaneous QHS  . insulin aspart  15 Units Subcutaneous TID WC  . insulin aspart  25 Units Subcutaneous Once  . insulin detemir  20 Units Subcutaneous Daily  . mouth rinse  15 mL Mouth Rinse BID  . metoprolol tartrate  25 mg Oral BID  . pantoprazole  40 mg Oral BID AC  . predniSONE  40 mg Oral Q breakfast   Continuous Infusions:   Procedures/Studies: DG Elbow Complete Left  Result Date: 06/06/2020 CLINICAL DATA:  Pain following fall EXAM: LEFT ELBOW - COMPLETE 3+ VIEW COMPARISON:  None. FINDINGS: Frontal, lateral, and bilateral oblique views were obtained. No fracture, dislocation, or joint effusion. There is spurring along the medial distal humeral condyle. No appreciable joint space narrowing or erosion. IMPRESSION: Spurring along the medial distal humeral condyle. No appreciable joint space narrowing. No fracture or dislocation. Electronically Signed   By: Lowella Grip III M.D.   On: 06/06/2020 12:18   US RENAL  Result Date: 06/17/2020 CLINICAL DATA:  Chronic renal disease stage IV EXAM: RENAL / URINARY TRACT ULTRASOUND COMPLETE COMPARISON:  April 21, 2019 FINDINGS: Right Kidney: Renal measurements: 10.7 cm x 5.2 cm x 5.5 cm = volume: 162.9 mL. Echogenicity within normal limits. A 3.1 cm x 3.3 cm x 2.7 cm complex anechoic structure is seen within the mid right kidney. No hydronephrosis is visualized. Left Kidney: Renal measurements: 12.3 cm x 7.2 cm x 6.1 cm = volume:  283.3 mL. Echogenicity within normal limits. 2.1 cm x 2.1 cm x 2.3 cm and 3.0 cm x 2.1 cm x 2.4 cm anechoic structures are seen within the mid and lower left kidney. An 8.5 mm shadowing echogenic focus is also seen within the lower pole of the left kidney. No hydronephrosis is visualized. Bladder: Appears normal for degree of bladder distention. Bilateral ureteral jets are visualized. Other: None. IMPRESSION: 1. Bilateral renal cysts, mildly increased in size when compared to the prior study. 2. Subcentimeter non-obstructing renal stone within the lower pole of the left kidney. Electronically Signed   By: Virgina Norfolk M.D.   On: 06/17/2020 23:57   US Venous Img Upper Uni Right(DVT)  Result Date: 07/04/2020 CLINICAL DATA:  Right upper extremity edema. History of diabetes. Former smoker. Evaluate for DVT. EXAM: RIGHT UPPER EXTREMITY VENOUS DOPPLER ULTRASOUND TECHNIQUE: Gray-scale sonography with graded compression, as well as color Doppler and duplex ultrasound were performed  to evaluate the upper extremity deep venous system from the level of the subclavian vein and including the jugular, axillary, basilic, radial, ulnar and upper cephalic vein. Spectral Doppler was utilized to evaluate flow at rest and with distal augmentation maneuvers. COMPARISON:  None. FINDINGS: Contralateral Subclavian Vein: Respiratory phasicity is normal and symmetric with the symptomatic side. No evidence of thrombus. Normal compressibility. Internal Jugular Vein: No evidence of thrombus. Normal compressibility, respiratory phasicity and response to augmentation. Subclavian Vein: No evidence of thrombus. Normal compressibility, respiratory phasicity and response to augmentation. Axillary Vein: No evidence of thrombus. Normal compressibility, respiratory phasicity and response to augmentation. Cephalic Vein: No evidence of thrombus. Normal compressibility, respiratory phasicity and response to augmentation. Basilic Vein: Not well  visualized. Brachial Veins: No evidence of thrombus. Normal compressibility, respiratory phasicity and response to augmentation. Radial Veins: No evidence of thrombus. Normal compressibility, respiratory phasicity and response to augmentation. Ulnar Veins: No evidence of thrombus. Normal compressibility, respiratory phasicity and response to augmentation. Venous Reflux:  None visualized. Other Findings: There is a large amount of subcutaneous edema at the level of the right forearm (images 34 through 38). IMPRESSION: 1. No evidence of DVT within the right upper extremity. 2. Large amount of subcutaneous edema at the level of the right forearm Electronically Signed   By: Sandi Mariscal M.D.   On: 07/04/2020 15:16   DG Chest Port 1 View  Result Date: 07/02/2020 CLINICAL DATA:  Pneumonitis. EXAM: PORTABLE CHEST 1 VIEW COMPARISON:  06/30/2020. FINDINGS: Cardiomegaly. Diffuse bilateral interstitial infiltrates again noted. Slight improvement from prior exam. No pleural effusion or pneumothorax. Thoracic spine scoliosis and degenerative change. IMPRESSION: 1. Cardiomegaly. 2. Diffuse bilateral interstitial infiltrates again noted. Slight improvement from prior exam. Electronically Signed   By: Marcello Moores  Register   On: 07/02/2020 07:30   DG CHEST PORT 1 VIEW  Result Date: 06/30/2020 CLINICAL DATA:  Sepsis, acute respiratory failure, pneumonia and pulmonary edema, acute on chronic CHF. EXAM: PORTABLE CHEST 1 VIEW COMPARISON:  Chest x-ray dated 06/27/2020 FINDINGS: Persistent bilateral airspace opacities, but slightly improved aeration of both lungs. No pleural effusion or pneumothorax is seen. Heart size and mediastinal contours are grossly stable. IMPRESSION: Slightly improved aeration of both lungs, but with persistent bilateral airspace opacities, compatible with multifocal pneumonia versus pulmonary edema. Electronically Signed   By: Franki Cabot M.D.   On: 06/30/2020 05:49   DG CHEST PORT 1 VIEW  Result Date:  06/27/2020 CLINICAL DATA:  Shortness of breath. EXAM: PORTABLE CHEST 1 VIEW COMPARISON:  06/24/2020.  CT abdomen 06/19/2014. FINDINGS: Stable cardiomegaly. Progressive diffuse severe bilateral pulmonary infiltrates/edema. No prominent pleural effusion. No pneumothorax. Stable calcific densities noted over the right upper quadrant, most likely dystrophic calcification. Degenerative change thoracic spine and both shoulders. IMPRESSION: 1. Stable cardiomegaly. 2. Progressive diffuse severe bilateral pulmonary infiltrates/edema. Electronically Signed   By: Marcello Moores  Register   On: 06/27/2020 05:56   DG Chest Port 1 View  Result Date: 06/24/2020 CLINICAL DATA:  Shortness of breath. EXAM: PORTABLE CHEST 1 VIEW COMPARISON:  Dec 12, 2019. FINDINGS: Stable cardiomediastinal silhouette. No pneumothorax or pleural effusion is noted. Bilateral patchy airspace opacities are now noted concerning for multifocal pneumonia. Bony thorax is unremarkable IMPRESSION: Bilateral patchy airspace opacities are noted concerning for multifocal pneumonia. Electronically Signed   By: Marijo Conception M.D.   On: 06/24/2020 11:37   DG Hip Unilat W or Wo Pelvis 2-3 Views Left  Result Date: 06/06/2020 CLINICAL DATA:  Left hip pain since a fall 06/03/2020.  Initial encounter. EXAM: DG HIP (WITH OR WITHOUT PELVIS) 2-3V LEFT COMPARISON:  Plain films left hip 03/23/2020. FINDINGS: There is no acute bony or joint abnormality. Hip joint spaces are preserved. Scattered enthesopathic change noted. IMPRESSION: No acute abnormality. Electronically Signed   By: Inge Rise M.D.   On: 06/06/2020 12:18   US Abdomen Limited RUQ (LIVER/GB)  Result Date: 06/25/2020 CLINICAL DATA:  Elevated LFTs, post cholecystectomy EXAM: ULTRASOUND ABDOMEN LIMITED RIGHT UPPER QUADRANT COMPARISON:  September of 2018 CT evaluation FINDINGS: Gallbladder: Post cholecystectomy. Common bile duct: Diameter: 4.2 mm Liver: Mildly increased echogenicity of hepatic  parenchyma without visible lesion on limited images. Portal vein is patent on color Doppler imaging with normal direction of blood flow towards the liver. Other: RIGHT pleural effusion, may be moderate. IMPRESSION: 1. Hepatic steatosis, mild. 2. No biliary duct dilation post cholecystectomy. 3. RIGHT-sided pleural effusion partially imaged. Electronically Signed   By: Zetta Bills M.D.   On: 06/25/2020 15:07    Kathie Dike, MD  [facility]  If 7PM-7AM, please contact night-coverage [email or web address]  07/05/2020, 5:10 PM   LOS: 11 days

## 2020-07-05 NOTE — TOC Progression Note (Signed)
Transition of Care [facility]) - Progression Note    Patient Details  Name: Jack Mejia MRN: [id number] Date of Birth: 1946-02-10  [facility]) CM/SW Contact  [place] Morren, Chauncey Reading, RN Phone Number: 07/05/2020, 12:12 PM  Clinical Narrative:    Discussed potential weekend DC with Kerri of [facility]. Marianna Fuss does have insurance authorization and patient can DC to [facility] over the weekend. Will need updated Covid test prior to transfer to [facility]    Expected Discharge Plan: [place] Barriers to Discharge: Continued Medical Work up  Expected Discharge Plan and Services Expected Discharge Plan: [place] In-house Referral: Clinical Social Work Discharge Planning Services: CM Consult   Living arrangements for the past 2 months: Single Family Home                 DME Arranged: N/A DME Agency: NA          Social Determinants of Health (SDOH) Interventions    Readmission Risk Interventions No flowsheet data found.

## 2020-07-06 LAB — GLUCOSE, CAPILLARY
Glucose-Capillary: 84 mg/dL (ref 70–99)
Glucose-Capillary: 243 mg/dL — ABNORMAL HIGH (ref 70–99)
Glucose-Capillary: 309 mg/dL — ABNORMAL HIGH (ref 70–99)
Glucose-Capillary: 277 mg/dL — ABNORMAL HIGH (ref 70–99)

## 2020-07-06 LAB — BASIC METABOLIC PANEL
Anion gap: 8 (ref 5–15)
BUN: 43 mg/dL — ABNORMAL HIGH (ref 8–23)
CO2: 28 mmol/L (ref 22–32)
Calcium: 8.2 mg/dL — ABNORMAL LOW (ref 8.9–10.3)
Chloride: 98 mmol/L (ref 98–111)
Creatinine, Ser: 1.34 mg/dL — ABNORMAL HIGH (ref 0.61–1.24)
GFR, Estimated: 56 mL/min — ABNORMAL LOW (ref >60)
Glucose, Bld: 94 mg/dL (ref 70–99)
Potassium: 3.4 mmol/L — ABNORMAL LOW (ref 3.5–5.1)
Sodium: 134 mmol/L — ABNORMAL LOW (ref 135–145)

## 2020-07-06 MED ORDER — INSULIN DETEMIR 100 UNIT/ML ~~LOC~~ SOLN
15.0000 [IU] | Freq: Every day | SUBCUTANEOUS | Status: DC
  Administered 2020-07-07 – 2020-07-10 (×4): 15 [IU] via SUBCUTANEOUS
  Filled 2020-07-06 (×5): qty 0.15

## 2020-07-06 MED ORDER — INSULIN ASPART 100 UNIT/ML ~~LOC~~ SOLN
15.0000 [IU] | Freq: Three times a day (TID) | SUBCUTANEOUS | Status: DC
  Administered 2020-07-07 – 2020-07-10 (×11): 15 [IU] via SUBCUTANEOUS

## 2020-07-06 MED ORDER — POTASSIUM CHLORIDE CRYS ER 20 MEQ PO TBCR
40.0000 meq | EXTENDED_RELEASE_TABLET | Freq: Once | ORAL | Status: AC
  Administered 2020-07-06: 40 meq via ORAL
  Filled 2020-07-06: qty 2

## 2020-07-06 NOTE — Progress Notes (Signed)
PROGRESS NOTE  Jack Mejia IOX:[id number] DOB: 1946-06-03 DOA: 06/24/2020 PCP: Erven Colla, DO  Brief History:  74 y.o.year old malewith a history of paroxysmal atrial fibrillation on apixaban, CKD stage III, hypertension, diastolic CHF, hyperlipidemia, diabetes mellitus type 2,macrocytic anemia following with hematology s/p iron injection and receiving Retacrit, and adenomatous colon polyps in 2011who was admitted 06/24/20 with sepsis and acuterespiratory failurein the setting of pneumonia and pulmonary edema withacute on chronic diastolic CHF.  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 Melvyn Novas and Dr Halford Chessman -Stopped amiodarone -Treated with IV Solu-Medrol started 06/28/2020 through 07/02/2020,  -Transition to prednisone 40 mg daily on 12/8 with plans to decrease by 10 mg every 7 days -Oxygen has been weaned down to less than 2 L/min from 15 L last week No longer requiring BiPAP -Wean oxygen as tolerated for saturation greater 92% BC x 2  11/29 >>> RESP viral Panel  PCR 11/29 neg MRSA PCR   11/30  Neg Strep urinary ag  11/30 neg  Legionella urinary ag  11/30 >>>neg  -Overall significant improvement since starting steroids on 06/28/2020 -Repeat chest x-ray on 07/02/2020 shows improvement -  Acute on chronic diastolic CHF -Volume overload status-worsened with transfusion of PRBC -Continues to have volume overload and shortness of breath -Continue Bumex twice daily -Urine output has been fair with 2350cc of urine made  yesterday -Started on fluid restriction -Plan will be to discharge on p.o. torsemide as patient has tolerated this in the past -Daily weights -Accurate I's and O's -05/08/2020 echo EF 65 to 70%, no WMA, grade 1 DD, trivial MR, moderate AAS --BNP is elevated  DM2- last A1c 8.3 reflecting uncontrolled DM with hyperglycemia -NovoLog sliding scale -Holding glipizide -Blood sugars uncontrolled with hyperglycemia, likely related to steroids -Adjusting Levemir and NovoLog with meals  CAP/Lobar pneumonia -COVID-19 PCR negative -Patient completed ceftriaxone and doxycycline -Continue bronchodilators and mucolytics -Repeat chest x-ray from 07/02/2020 noted  Severe sepsis -Present on admission -Presented with leukocytosis and tachypnea -Secondary to pneumonia as above -Lactic acid peaked 3.4 -Completed antibiotics as above -Sepsis pathophysiology appears to have resolved  Acute on chronic renal failure CKD stage IIIb -Baseline creatinine 2.0-2.2 -Presented with serum creatinine 2.51 -Monitor closely with diuresis AKI----acute kidney injury on CKD stage - 3B--due to sepsis and hypoperfusion -  creatinine on admission=2.51  , baseline creatinine =2.0     , creatinine is now=1.3 ----renally adjust medications, avoid nephrotoxic agents / dehydration  / hypotension  Hematochezia/ABLA -GI consult -baseline Hgb 9-10 -presented with Hgb 7.5 -Received a total of 2 units of PRBC this admission -Follow-up hemoglobin improved to 9 and has been fluctuating between 8 and 9 -GI consult appreciated -Patient's cardiopulmonary  status precludes endoluminal evaluation -Advance to GI soft diet  Paroxysmal Atrial Fibrillation -holding apixaban due to hematochezia -Continue Cardizem CD -Stopped amiodarone due to concerns about possible amiodarone induced pneumonitis  Essential Hypertension--stable -Continue diltiazem CD -Continue metoprolol heart rate  Hyperlipidemia -continue  statin  Hypokalemia/Hypomagnesemia--- replace and recheck, electrolytes abnormalities may persist due to high-dose diuretic use  Social/ethics--plan of care, advanced directives and goals of care discussed with patient  and patient's 2 daughters [name] and [name] -Palliative care consult appreciated patient is a DNR/DNI with full scope of treatment  Disposition--- seen by physical therapy with recommendations for skilled nursing facility placement  Status is: Inpatient  Remains inpatient appropriate because:Acute hypoxic respiratory failure requiring IV diuretic and IV steroids   Dispo: The patient is from: Home              Anticipated d/c is to: SNF              Anticipated d/c date is: 2 days              Patient currently is not medically stable to d/c.  Family Communication: -Discussed with patient   Consultants:  Pulm  Code Status:    Code Status: DNR  DVT Prophylaxis: SCDs  Procedures: As Listed in Progress Note Above  Antibiotics: Rocephin/doxy-completed  Subjective: Says he does not feel well today.  Still feels short of breath.  Objective: Vitals:   07/06/20 0456 07/06/20 0500 07/06/20 0906 07/06/20 1422  BP: (!) 143/63   (!) 129/59  Pulse: (!) 56  68 (!) 58  Resp: 18   17  Temp: 97.8 F (36.6 C)   98.1 F (36.7 C)  TempSrc:    Oral  SpO2: 99%   97%  Weight:  96.5 kg    Height:        Intake/Output Summary (Last 24 hours) at 07/06/2020 1841 Last data filed at 07/06/2020 1700 Gross per 24 hour  Intake 720 ml  Output 1950 ml  Net -1230 ml   Weight change:  Exam:  General exam: Alert, awake, oriented x 3 Respiratory system: Crackles at bases. Respiratory effort normal. Cardiovascular system:RRR. No murmurs, rubs, gallops. Gastrointestinal system: Abdomen is nondistended, soft and nontender. No organomegaly or masses felt. Normal bowel sounds heard. Central nervous system: Alert and oriented. No focal neurological deficits. Extremities: 1-2+  edema bilaterally Skin: No rashes, lesions or ulcers  Psychiatry: Judgement and insight appear normal. Mood & affect appropriate.    Data Reviewed: I have personally reviewed following labs and imaging studies  Basic Metabolic Panel: Recent Labs  Lab 06/30/20 0308 07/02/20 0442 07/04/20 0433 07/05/20 0615 07/06/20 0521  NA 131*   130* 132* 131* 134* 134*  K 3.4*   3.4* 3.8 3.9 3.6 3.4*  CL 99   99 98 97* 97* 98  CO2 21*   20* 25 26 28 28   GLUCOSE 364*   366* 231* 257* 136* 94  BUN 36*   36* 46* 51* 50* 43*  CREATININE 1.72*   1.68* 1.67* 1.74* 1.52* 1.34*  CALCIUM 7.8*   7.8* 8.2* 8.2* 8.2* 8.2*  MG 1.9  --   --   --   --   PHOS 3.5  --   --   --   --    Liver Function Tests: Recent Labs  Lab 06/30/20 0308  ALBUMIN 2.1*   No results for input(s): LIPASE, AMYLASE in the last 168 hours. No results for input(s): AMMONIA in the last 168 hours. Coagulation Profile: No results for input(s): INR, PROTIME in the last 168 hours. CBC: Recent Labs  Lab 06/30/20 0308 07/02/20 0442 07/04/20 0433  WBC 9.5 16.4* 27.5*  HGB 8.2* 8.7* 8.6*  HCT 25.7* 28.3* 27.5*  MCV 103.6* 103.3* 102.6*  PLT 148* 172 174   Cardiac Enzymes: No results for input(s): CKTOTAL, CKMB, CKMBINDEX, TROPONINI in the last 168 hours. BNP: Invalid input(s): POCBNP CBG: Recent  Labs  Lab 07/05/20 1828 07/05/20 2124 07/06/20 0745 07/06/20 1118 07/06/20 1656  GLUCAP 426* 289* 84 243* 309*   HbA1C: No results for input(s): HGBA1C in the last 72 hours. Urine analysis:    Component Value Date/Time   COLORURINE COLORLESS (A) 06/06/2020 1151   APPEARANCEUR CLEAR 06/06/2020 1151   LABSPEC 1.013 06/06/2020 1151   PHURINE 5.0 06/06/2020 1151   GLUCOSEU >=500 (A) 06/06/2020 1151   HGBUR MODERATE (A) 06/06/2020 1151   BILIRUBINUR NEGATIVE 06/06/2020 1151   KETONESUR NEGATIVE 06/06/2020 1151   PROTEINUR NEGATIVE 06/06/2020 1151   NITRITE NEGATIVE 06/06/2020 1151   LEUKOCYTESUR NEGATIVE 06/06/2020  1151   Sepsis Labs: @LABRCNTIP (procalcitonin:4,lacticidven:4) ) No results found for this or any previous visit (from the past 240 hour(s)).   Scheduled Meds:  atorvastatin  20 mg Oral Daily   bumetanide (BUMEX) IV  1 mg Intravenous Q12H   Chlorhexidine Gluconate Cloth  6 each Topical Daily   diltiazem  360 mg Oral Daily   feeding supplement (GLUCERNA SHAKE)  237 mL Oral TID   hydrocortisone   Rectal BID   insulin aspart  0-20 Units Subcutaneous TID WC   insulin aspart  0-5 Units Subcutaneous QHS   [START ON 07/07/2020] insulin aspart  15 Units Subcutaneous TID WC   [START ON 07/07/2020] insulin detemir  15 Units Subcutaneous Daily   mouth rinse  15 mL Mouth Rinse BID   metoprolol tartrate  25 mg Oral BID   pantoprazole  40 mg Oral BID AC   predniSONE  40 mg Oral Q breakfast   Continuous Infusions:   Procedures/Studies: US RENAL  Result Date: 06/17/2020 CLINICAL DATA:  Chronic renal disease stage IV EXAM: RENAL / URINARY TRACT ULTRASOUND COMPLETE COMPARISON:  April 21, 2019 FINDINGS: Right Kidney: Renal measurements: 10.7 cm x 5.2 cm x 5.5 cm = volume: 162.9 mL. Echogenicity within normal limits. A 3.1 cm x 3.3 cm x 2.7 cm complex anechoic structure is seen within the mid right kidney. No hydronephrosis is visualized. Left Kidney: Renal measurements: 12.3 cm x 7.2 cm x 6.1 cm = volume: 283.3 mL. Echogenicity within normal limits. 2.1 cm x 2.1 cm x 2.3 cm and 3.0 cm x 2.1 cm x 2.4 cm anechoic structures are seen within the mid and lower left kidney. An 8.5 mm shadowing echogenic focus is also seen within the lower pole of the left kidney. No hydronephrosis is visualized. Bladder: Appears normal for degree of bladder distention. Bilateral ureteral jets are visualized. Other: None. IMPRESSION: 1. Bilateral renal cysts, mildly increased in size when compared to the prior study. 2. Subcentimeter non-obstructing renal stone within the lower pole of the left kidney.  Electronically Signed   By: Virgina Norfolk M.D.   On: 06/17/2020 23:57   US Venous Img Upper Uni Right(DVT)  Result Date: 07/04/2020 CLINICAL DATA:  Right upper extremity edema. History of diabetes. Former smoker. Evaluate for DVT. EXAM: RIGHT UPPER EXTREMITY VENOUS DOPPLER ULTRASOUND TECHNIQUE: Gray-scale sonography with graded compression, as well as color Doppler and duplex ultrasound were performed to evaluate the upper extremity deep venous system from the level of the subclavian vein and including the jugular, axillary, basilic, radial, ulnar and upper cephalic vein. Spectral Doppler was utilized to evaluate flow at rest and with distal augmentation maneuvers. COMPARISON:  None. FINDINGS: Contralateral Subclavian Vein: Respiratory phasicity is normal and symmetric with the symptomatic side. No evidence of thrombus. Normal compressibility. Internal Jugular Vein: No evidence of thrombus. Normal compressibility, respiratory phasicity and response  to augmentation. Subclavian Vein: No evidence of thrombus. Normal compressibility, respiratory phasicity and response to augmentation. Axillary Vein: No evidence of thrombus. Normal compressibility, respiratory phasicity and response to augmentation. Cephalic Vein: No evidence of thrombus. Normal compressibility, respiratory phasicity and response to augmentation. Basilic Vein: Not well visualized. Brachial Veins: No evidence of thrombus. Normal compressibility, respiratory phasicity and response to augmentation. Radial Veins: No evidence of thrombus. Normal compressibility, respiratory phasicity and response to augmentation. Ulnar Veins: No evidence of thrombus. Normal compressibility, respiratory phasicity and response to augmentation. Venous Reflux:  None visualized. Other Findings: There is a large amount of subcutaneous edema at the level of the right forearm (images 34 through 38). IMPRESSION: 1. No evidence of DVT within the right upper extremity. 2. Large  amount of subcutaneous edema at the level of the right forearm Electronically Signed   By: Sandi Mariscal M.D.   On: 07/04/2020 15:16   DG Chest Port 1 View  Result Date: 07/02/2020 CLINICAL DATA:  Pneumonitis. EXAM: PORTABLE CHEST 1 VIEW COMPARISON:  06/30/2020. FINDINGS: Cardiomegaly. Diffuse bilateral interstitial infiltrates again noted. Slight improvement from prior exam. No pleural effusion or pneumothorax. Thoracic spine scoliosis and degenerative change. IMPRESSION: 1. Cardiomegaly. 2. Diffuse bilateral interstitial infiltrates again noted. Slight improvement from prior exam. Electronically Signed   By: Marcello Moores  Register   On: 07/02/2020 07:30   DG CHEST PORT 1 VIEW  Result Date: 06/30/2020 CLINICAL DATA:  Sepsis, acute respiratory failure, pneumonia and pulmonary edema, acute on chronic CHF. EXAM: PORTABLE CHEST 1 VIEW COMPARISON:  Chest x-ray dated 06/27/2020 FINDINGS: Persistent bilateral airspace opacities, but slightly improved aeration of both lungs. No pleural effusion or pneumothorax is seen. Heart size and mediastinal contours are grossly stable. IMPRESSION: Slightly improved aeration of both lungs, but with persistent bilateral airspace opacities, compatible with multifocal pneumonia versus pulmonary edema. Electronically Signed   By: Franki Cabot M.D.   On: 06/30/2020 05:49   DG CHEST PORT 1 VIEW  Result Date: 06/27/2020 CLINICAL DATA:  Shortness of breath. EXAM: PORTABLE CHEST 1 VIEW COMPARISON:  06/24/2020.  CT abdomen 06/19/2014. FINDINGS: Stable cardiomegaly. Progressive diffuse severe bilateral pulmonary infiltrates/edema. No prominent pleural effusion. No pneumothorax. Stable calcific densities noted over the right upper quadrant, most likely dystrophic calcification. Degenerative change thoracic spine and both shoulders. IMPRESSION: 1. Stable cardiomegaly. 2. Progressive diffuse severe bilateral pulmonary infiltrates/edema. Electronically Signed   By: Marcello Moores  Register   On:  06/27/2020 05:56   DG Chest Port 1 View  Result Date: 06/24/2020 CLINICAL DATA:  Shortness of breath. EXAM: PORTABLE CHEST 1 VIEW COMPARISON:  Dec 12, 2019. FINDINGS: Stable cardiomediastinal silhouette. No pneumothorax or pleural effusion is noted. Bilateral patchy airspace opacities are now noted concerning for multifocal pneumonia. Bony thorax is unremarkable IMPRESSION: Bilateral patchy airspace opacities are noted concerning for multifocal pneumonia. Electronically Signed   By: Marijo Conception M.D.   On: 06/24/2020 11:37   US Abdomen Limited RUQ (LIVER/GB)  Result Date: 06/25/2020 CLINICAL DATA:  Elevated LFTs, post cholecystectomy EXAM: ULTRASOUND ABDOMEN LIMITED RIGHT UPPER QUADRANT COMPARISON:  September of 2018 CT evaluation FINDINGS: Gallbladder: Post cholecystectomy. Common bile duct: Diameter: 4.2 mm Liver: Mildly increased echogenicity of hepatic parenchyma without visible lesion on limited images. Portal vein is patent on color Doppler imaging with normal direction of blood flow towards the liver. Other: RIGHT pleural effusion, may be moderate. IMPRESSION: 1. Hepatic steatosis, mild. 2. No biliary duct dilation post cholecystectomy. 3. RIGHT-sided pleural effusion partially imaged. Electronically Signed   By: Cay Schillings  Wile M.D.   On: 06/25/2020 15:07    Kathie Dike, MD  [facility]  If 7PM-7AM, please contact night-coverage [email or web address]  07/06/2020, 6:41 PM   LOS: 12 days

## 2020-07-07 LAB — BASIC METABOLIC PANEL
Anion gap: 6 (ref 5–15)
BUN: 18 mg/dL (ref 8–23)
CO2: 30 mmol/L (ref 22–32)
Calcium: 8.3 mg/dL — ABNORMAL LOW (ref 8.9–10.3)
Chloride: 92 mmol/L — ABNORMAL LOW (ref 98–111)
Creatinine, Ser: 0.91 mg/dL (ref 0.61–1.24)
GFR, Estimated: >60 mL/min (ref >60)
Glucose, Bld: 106 mg/dL — ABNORMAL HIGH (ref 70–99)
Potassium: 3.9 mmol/L (ref 3.5–5.1)
Sodium: 128 mmol/L — ABNORMAL LOW (ref 135–145)

## 2020-07-07 LAB — GLUCOSE, CAPILLARY
Glucose-Capillary: 150 mg/dL — ABNORMAL HIGH (ref 70–99)
Glucose-Capillary: 281 mg/dL — ABNORMAL HIGH (ref 70–99)
Glucose-Capillary: 200 mg/dL — ABNORMAL HIGH (ref 70–99)
Glucose-Capillary: 160 mg/dL — ABNORMAL HIGH (ref 70–99)

## 2020-07-07 MED ORDER — METOLAZONE 5 MG PO TABS
5.0000 mg | ORAL_TABLET | Freq: Once | ORAL | Status: AC
  Administered 2020-07-07: 5 mg via ORAL
  Filled 2020-07-07: qty 1

## 2020-07-07 NOTE — Progress Notes (Signed)
PROGRESS NOTE  Jack Mejia HYQ:[id number] DOB: 10-13-1945 DOA: 06/24/2020 PCP: Erven Colla, DO  Brief History:  74 y.o.year old malewith a history of paroxysmal atrial fibrillation on apixaban, CKD stage III, hypertension, diastolic CHF, hyperlipidemia, diabetes mellitus type 2,macrocytic anemia following with hematology s/p iron injection and receiving Retacrit, and adenomatous colon polyps in 2011who was admitted 06/24/20 with sepsis and acuterespiratory failurein the setting of pneumonia and pulmonary edema withacute on chronic diastolic CHF.  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 Melvyn Novas and Dr Halford Chessman -Stopped amiodarone -Treated with IV Solu-Medrol started 06/28/2020 through 07/02/2020,  -Transition to prednisone 40 mg daily on 12/8 with plans to decrease by 10 mg every 7 days -Oxygen has been weaned down to less than 2 L/min from 15 L last week No longer requiring BiPAP -Wean oxygen as tolerated for saturation greater 92% BC x 2  11/29 >>> RESP viral Panel  PCR 11/29 neg MRSA PCR   11/30  Neg Strep urinary ag  11/30 neg  Legionella urinary ag  11/30 >>>neg  -Overall significant improvement since starting steroids on 06/28/2020 -Repeat chest x-ray on 07/02/2020 shows improvement -  Acute on chronic diastolic CHF -Volume overload status-worsened with transfusion of PRBC -Continues to have volume overload and shortness of breath -Continue Bumex twice daily -Urine output has been tapering off -We will give 1  dose of metolazone today -Continue on fluid restriction -Plan will be to discharge on p.o. torsemide as patient has tolerated this in the past -Daily weights -Accurate I's and O's -05/08/2020 echo EF 65 to 70%, no WMA, grade 1 DD, trivial MR, moderate AAS --BNP is elevated  DM2- last A1c 8.3 reflecting uncontrolled DM with hyperglycemia -NovoLog sliding scale -Holding glipizide -Blood sugars uncontrolled with hyperglycemia, likely related to steroids -Adjusting Levemir and NovoLog with meals  CAP/Lobar pneumonia -COVID-19 PCR negative -Patient completed ceftriaxone and doxycycline -Continue bronchodilators and mucolytics -Repeat chest x-ray from 07/02/2020 noted  Severe sepsis -Present on admission -Presented with leukocytosis and tachypnea -Secondary to pneumonia as above -Lactic acid peaked 3.4 -Completed antibiotics as above -Sepsis pathophysiology appears to have resolved  Acute on chronic renal failure CKD stage IIIb -Baseline creatinine 2.0-2.2 -Presented with serum creatinine 2.51 -Monitor closely with diuresis AKI----acute kidney injury on CKD stage - 3B--due to sepsis and hypoperfusion -  creatinine on admission=2.51  , baseline creatinine =2.0     , creatinine is now=0.9 ----renally adjust medications, avoid nephrotoxic agents / dehydration  / hypotension  Hematochezia/ABLA -GI consult -baseline Hgb 9-10 -presented with Hgb 7.5 -Received a total of 2 units of PRBC this admission -Follow-up hemoglobin improved to 9 and has been fluctuating between 8 and 9 -GI consult appreciated -Patient's cardiopulmonary  status precludes endoluminal evaluation -Advance to GI soft diet  Paroxysmal Atrial Fibrillation -holding apixaban due to hematochezia -Continue Cardizem CD -Stopped amiodarone due to concerns about possible amiodarone induced pneumonitis  Essential Hypertension--stable -Continue diltiazem CD -Continue metoprolol heart rate  Hyperlipidemia -continue  statin  Hypokalemia/Hypomagnesemia--- replace and recheck, electrolytes abnormalities may persist due to high-dose diuretic use  Social/ethics--plan of care, advanced directives and goals of care  discussed with patient and patient's 2 daughters [name] and [name] -Palliative care consult appreciated patient is a DNR/DNI with full scope of treatment  Disposition--- seen by physical therapy with recommendations for skilled nursing facility placement  Status is: Inpatient  Remains inpatient appropriate because:Acute hypoxic respiratory failure requiring IV diuretic and IV steroids   Dispo: The patient is from: Home              Anticipated d/c is to: SNF              Anticipated d/c date is: 2 days              Patient currently is not medically stable to d/c.  Family Communication: -Discussed with patient   Consultants:  Pulm  Code Status:    Code Status: DNR  DVT Prophylaxis: SCDs  Procedures: As Listed in Progress Note Above  Antibiotics: Rocephin/doxy-completed  Subjective: Says that breathing is feeling better.  He is tired and feels weak.  Continues to have swallowing in extremities.  Objective: Vitals:   07/07/20 0606 07/07/20 0607 07/07/20 0912 07/07/20 0915  BP:  (!) 143/68 (!) 155/67   Pulse:  (!) 58  61  Resp:  18    Temp:  (!) 97.1 F (36.2 C)    TempSrc:      SpO2:  95%    Weight: 94.5 kg     Height:        Intake/Output Summary (Last 24 hours) at 07/07/2020 1712 Last data filed at 07/07/2020 1300 Gross per 24 hour  Intake 480 ml  Output 100 ml  Net 380 ml   Weight change: -2 kg Exam:  General exam: Alert, awake, oriented x 3 Respiratory system: Clear to auscultation. Respiratory effort normal. Cardiovascular system:RRR. No murmurs, rubs, gallops. Gastrointestinal system: Abdomen is nondistended, soft and nontender. No organomegaly or masses felt. Normal bowel sounds heard. Central nervous system: Alert and oriented. No focal neurological  deficits. Extremities: Right upper extremity is significantly edematous Skin: No rashes, lesions or ulcers  Psychiatry: Judgement and insight appear normal. Mood & affect appropriate.      Data Reviewed: I have personally reviewed following labs and imaging studies  Basic Metabolic Panel: Recent Labs  Lab 07/02/20 0442 07/04/20 0433 07/05/20 0615 07/06/20 0521 07/07/20 0756  NA 132* 131* 134* 134* 128*  K 3.8 3.9 3.6 3.4* 3.9  CL 98 97* 97* 98 92*  CO2 25 26 28 28 30   GLUCOSE 231* 257* 136* 94 106*  BUN 46* 51* 50* 43* 18  CREATININE 1.67* 1.74* 1.52* 1.34* 0.91  CALCIUM 8.2* 8.2* 8.2* 8.2* 8.3*   Liver Function Tests: No results for input(s): AST, ALT, ALKPHOS, BILITOT, PROT, ALBUMIN in the last 168 hours. No results for input(s): LIPASE, AMYLASE in the last 168 hours. No results for input(s): AMMONIA in the last 168 hours. Coagulation Profile: No results for input(s): INR, PROTIME in the last 168 hours. CBC: Recent Labs  Lab 07/02/20 0442 07/04/20 0433  WBC 16.4* 27.5*  HGB 8.7* 8.6*  HCT 28.3* 27.5*  MCV 103.3* 102.6*  PLT 172 174   Cardiac Enzymes: No results for input(s): CKTOTAL, CKMB, CKMBINDEX, TROPONINI in the last 168 hours. BNP: Invalid input(s): POCBNP CBG: Recent Labs  Lab 07/06/20 1656 07/06/20 2045 07/07/20 0757 07/07/20 1120 07/07/20 1640  GLUCAP 309* 277* 150* 281* 200*   HbA1C: No results for input(s): HGBA1C in the last 72 hours. Urine analysis:    Component Value Date/Time   COLORURINE  COLORLESS (A) 06/06/2020 1151   APPEARANCEUR CLEAR 06/06/2020 1151   LABSPEC 1.013 06/06/2020 1151   PHURINE 5.0 06/06/2020 1151   GLUCOSEU >=500 (A) 06/06/2020 1151   HGBUR MODERATE (A) 06/06/2020 1151   BILIRUBINUR NEGATIVE 06/06/2020 1151   KETONESUR NEGATIVE 06/06/2020 1151   PROTEINUR NEGATIVE 06/06/2020 1151   NITRITE NEGATIVE 06/06/2020 1151   LEUKOCYTESUR NEGATIVE 06/06/2020 1151   Sepsis  Labs: @LABRCNTIP (procalcitonin:4,lacticidven:4) ) No results found for this or any previous visit (from the past 240 hour(s)).   Scheduled Meds: . atorvastatin  20 mg Oral Daily  . bumetanide (BUMEX) IV  1 mg Intravenous Q12H  . Chlorhexidine Gluconate Cloth  6 each Topical Daily  . diltiazem  360 mg Oral Daily  . feeding supplement (GLUCERNA SHAKE)  237 mL Oral TID  . hydrocortisone   Rectal BID  . insulin aspart  0-20 Units Subcutaneous TID WC  . insulin aspart  0-5 Units Subcutaneous QHS  . insulin aspart  15 Units Subcutaneous TID WC  . insulin detemir  15 Units Subcutaneous Daily  . mouth rinse  15 mL Mouth Rinse BID  . metoprolol tartrate  25 mg Oral BID  . pantoprazole  40 mg Oral BID AC  . predniSONE  40 mg Oral Q breakfast   Continuous Infusions:   Procedures/Studies: US RENAL  Result Date: 06/17/2020 CLINICAL DATA:  Chronic renal disease stage IV EXAM: RENAL / URINARY TRACT ULTRASOUND COMPLETE COMPARISON:  April 21, 2019 FINDINGS: Right Kidney: Renal measurements: 10.7 cm x 5.2 cm x 5.5 cm = volume: 162.9 mL. Echogenicity within normal limits. A 3.1 cm x 3.3 cm x 2.7 cm complex anechoic structure is seen within the mid right kidney. No hydronephrosis is visualized. Left Kidney: Renal measurements: 12.3 cm x 7.2 cm x 6.1 cm = volume: 283.3 mL. Echogenicity within normal limits. 2.1 cm x 2.1 cm x 2.3 cm and 3.0 cm x 2.1 cm x 2.4 cm anechoic structures are seen within the mid and lower left kidney. An 8.5 mm shadowing echogenic focus is also seen within the lower pole of the left kidney. No hydronephrosis is visualized. Bladder: Appears normal for degree of bladder distention. Bilateral ureteral jets are visualized. Other: None. IMPRESSION: 1. Bilateral renal cysts, mildly increased in size when compared to the prior study. 2. Subcentimeter non-obstructing renal stone within the lower pole of the left kidney. Electronically Signed   By: Virgina Norfolk M.D.   On: 06/17/2020  23:57   US Venous Img Upper Uni Right(DVT)  Result Date: 07/04/2020 CLINICAL DATA:  Right upper extremity edema. History of diabetes. Former smoker. Evaluate for DVT. EXAM: RIGHT UPPER EXTREMITY VENOUS DOPPLER ULTRASOUND TECHNIQUE: Gray-scale sonography with graded compression, as well as color Doppler and duplex ultrasound were performed to evaluate the upper extremity deep venous system from the level of the subclavian vein and including the jugular, axillary, basilic, radial, ulnar and upper cephalic vein. Spectral Doppler was utilized to evaluate flow at rest and with distal augmentation maneuvers. COMPARISON:  None. FINDINGS: Contralateral Subclavian Vein: Respiratory phasicity is normal and symmetric with the symptomatic side. No evidence of thrombus. Normal compressibility. Internal Jugular Vein: No evidence of thrombus. Normal compressibility, respiratory phasicity and response to augmentation. Subclavian Vein: No evidence of thrombus. Normal compressibility, respiratory phasicity and response to augmentation. Axillary Vein: No evidence of thrombus. Normal compressibility, respiratory phasicity and response to augmentation. Cephalic Vein: No evidence of thrombus. Normal compressibility, respiratory phasicity and response to augmentation. Basilic Vein: Not well visualized. Brachial  Veins: No evidence of thrombus. Normal compressibility, respiratory phasicity and response to augmentation. Radial Veins: No evidence of thrombus. Normal compressibility, respiratory phasicity and response to augmentation. Ulnar Veins: No evidence of thrombus. Normal compressibility, respiratory phasicity and response to augmentation. Venous Reflux:  None visualized. Other Findings: There is a large amount of subcutaneous edema at the level of the right forearm (images 34 through 38). IMPRESSION: 1. No evidence of DVT within the right upper extremity. 2. Large amount of subcutaneous edema at the level of the right forearm  Electronically Signed   By: Sandi Mariscal M.D.   On: 07/04/2020 15:16   DG Chest Port 1 View  Result Date: 07/02/2020 CLINICAL DATA:  Pneumonitis. EXAM: PORTABLE CHEST 1 VIEW COMPARISON:  06/30/2020. FINDINGS: Cardiomegaly. Diffuse bilateral interstitial infiltrates again noted. Slight improvement from prior exam. No pleural effusion or pneumothorax. Thoracic spine scoliosis and degenerative change. IMPRESSION: 1. Cardiomegaly. 2. Diffuse bilateral interstitial infiltrates again noted. Slight improvement from prior exam. Electronically Signed   By: Marcello Moores  Register   On: 07/02/2020 07:30   DG CHEST PORT 1 VIEW  Result Date: 06/30/2020 CLINICAL DATA:  Sepsis, acute respiratory failure, pneumonia and pulmonary edema, acute on chronic CHF. EXAM: PORTABLE CHEST 1 VIEW COMPARISON:  Chest x-ray dated 06/27/2020 FINDINGS: Persistent bilateral airspace opacities, but slightly improved aeration of both lungs. No pleural effusion or pneumothorax is seen. Heart size and mediastinal contours are grossly stable. IMPRESSION: Slightly improved aeration of both lungs, but with persistent bilateral airspace opacities, compatible with multifocal pneumonia versus pulmonary edema. Electronically Signed   By: Franki Cabot M.D.   On: 06/30/2020 05:49   DG CHEST PORT 1 VIEW  Result Date: 06/27/2020 CLINICAL DATA:  Shortness of breath. EXAM: PORTABLE CHEST 1 VIEW COMPARISON:  06/24/2020.  CT abdomen 06/19/2014. FINDINGS: Stable cardiomegaly. Progressive diffuse severe bilateral pulmonary infiltrates/edema. No prominent pleural effusion. No pneumothorax. Stable calcific densities noted over the right upper quadrant, most likely dystrophic calcification. Degenerative change thoracic spine and both shoulders. IMPRESSION: 1. Stable cardiomegaly. 2. Progressive diffuse severe bilateral pulmonary infiltrates/edema. Electronically Signed   By: Marcello Moores  Register   On: 06/27/2020 05:56   DG Chest Port 1 View  Result Date:  06/24/2020 CLINICAL DATA:  Shortness of breath. EXAM: PORTABLE CHEST 1 VIEW COMPARISON:  Dec 12, 2019. FINDINGS: Stable cardiomediastinal silhouette. No pneumothorax or pleural effusion is noted. Bilateral patchy airspace opacities are now noted concerning for multifocal pneumonia. Bony thorax is unremarkable IMPRESSION: Bilateral patchy airspace opacities are noted concerning for multifocal pneumonia. Electronically Signed   By: Marijo Conception M.D.   On: 06/24/2020 11:37   US Abdomen Limited RUQ (LIVER/GB)  Result Date: 06/25/2020 CLINICAL DATA:  Elevated LFTs, post cholecystectomy EXAM: ULTRASOUND ABDOMEN LIMITED RIGHT UPPER QUADRANT COMPARISON:  September of 2018 CT evaluation FINDINGS: Gallbladder: Post cholecystectomy. Common bile duct: Diameter: 4.2 mm Liver: Mildly increased echogenicity of hepatic parenchyma without visible lesion on limited images. Portal vein is patent on color Doppler imaging with normal direction of blood flow towards the liver. Other: RIGHT pleural effusion, may be moderate. IMPRESSION: 1. Hepatic steatosis, mild. 2. No biliary duct dilation post cholecystectomy. 3. RIGHT-sided pleural effusion partially imaged. Electronically Signed   By: Zetta Bills M.D.   On: 06/25/2020 15:07    Kathie Dike, MD  [facility]  If 7PM-7AM, please contact night-coverage [email or web address]  07/07/2020, 5:12 PM   LOS: 13 days

## 2020-07-08 ENCOUNTER — Telehealth: Payer: Self-pay

## 2020-07-08 DIAGNOSIS — J189 Pneumonia, unspecified organism: Secondary | ICD-10-CM

## 2020-07-08 LAB — COMPREHENSIVE METABOLIC PANEL
ALT: 88 U/L — ABNORMAL HIGH (ref 0–44)
AST: 52 U/L — ABNORMAL HIGH (ref 15–41)
Albumin: 2.6 g/dL — ABNORMAL LOW (ref 3.5–5.0)
Alkaline Phosphatase: 204 U/L — ABNORMAL HIGH (ref 38–126)
Anion gap: 9 (ref 5–15)
BUN: 39 mg/dL — ABNORMAL HIGH (ref 8–23)
CO2: 32 mmol/L (ref 22–32)
Calcium: 8.5 mg/dL — ABNORMAL LOW (ref 8.9–10.3)
Chloride: 95 mmol/L — ABNORMAL LOW (ref 98–111)
Creatinine, Ser: 1.52 mg/dL — ABNORMAL HIGH (ref 0.61–1.24)
GFR, Estimated: 48 mL/min — ABNORMAL LOW (ref >60)
Glucose, Bld: 131 mg/dL — ABNORMAL HIGH (ref 70–99)
Potassium: 4.3 mmol/L (ref 3.5–5.1)
Sodium: 136 mmol/L (ref 135–145)
Total Bilirubin: 1 mg/dL (ref 0.3–1.2)
Total Protein: 5.9 g/dL — ABNORMAL LOW (ref 6.5–8.1)

## 2020-07-08 LAB — GLUCOSE, CAPILLARY
Glucose-Capillary: 119 mg/dL — ABNORMAL HIGH (ref 70–99)
Glucose-Capillary: 186 mg/dL — ABNORMAL HIGH (ref 70–99)
Glucose-Capillary: 229 mg/dL — ABNORMAL HIGH (ref 70–99)
Glucose-Capillary: 269 mg/dL — ABNORMAL HIGH (ref 70–99)

## 2020-07-08 LAB — CBC WITH DIFFERENTIAL/PLATELET
Abs Immature Granulocytes: 5.03 10*3/uL — ABNORMAL HIGH (ref 0.00–0.07)
Band Neutrophils: 2 %
Basophils Absolute: 0 10*3/uL (ref 0.0–0.1)
Basophils Relative: 0 %
Eosinophils Absolute: 0 10*3/uL (ref 0.0–0.5)
Eosinophils Relative: 0 %
HCT: 29.3 % — ABNORMAL LOW (ref 39.0–52.0)
Hemoglobin: 9.3 g/dL — ABNORMAL LOW (ref 13.0–17.0)
Lymphocytes Relative: 3 %
Lymphs Abs: 1.3 10*3/uL (ref 0.7–4.0)
MCH: 33.1 pg (ref 26.0–34.0)
MCHC: 31.7 g/dL (ref 30.0–36.0)
MCV: 104.3 fL — ABNORMAL HIGH (ref 80.0–100.0)
Metamyelocytes Relative: 4 %
Monocytes Absolute: 8 10*3/uL — ABNORMAL HIGH (ref 0.1–1.0)
Monocytes Relative: 18 %
Myelocytes: 4 %
Neutro Abs: 31.6 10*3/uL — ABNORMAL HIGH (ref 1.7–7.7)
Neutrophils Relative %: 69 %
Platelets: 144 10*3/uL — ABNORMAL LOW (ref 150–400)
RBC: 2.81 MIL/uL — ABNORMAL LOW (ref 4.22–5.81)
RDW: 20.4 % — ABNORMAL HIGH (ref 11.5–15.5)
WBC: 44.5 10*3/uL — ABNORMAL HIGH (ref 4.0–10.5)
nRBC: 0 % (ref 0.0–0.2)

## 2020-07-08 LAB — CBC
HCT: 29.5 % — ABNORMAL LOW (ref 39.0–52.0)
Hemoglobin: 9.3 g/dL — ABNORMAL LOW (ref 13.0–17.0)
MCH: 33.2 pg (ref 26.0–34.0)
MCHC: 31.5 g/dL (ref 30.0–36.0)
MCV: 105.4 fL — ABNORMAL HIGH (ref 80.0–100.0)
Platelets: 144 10*3/uL — ABNORMAL LOW (ref 150–400)
RBC: 2.8 MIL/uL — ABNORMAL LOW (ref 4.22–5.81)
RDW: 20.5 % — ABNORMAL HIGH (ref 11.5–15.5)
WBC: 39.1 10*3/uL — ABNORMAL HIGH (ref 4.0–10.5)
nRBC: 0 % (ref 0.0–0.2)

## 2020-07-08 NOTE — Progress Notes (Signed)
Physical Therapy Treatment Patient Details Name: Jack Mejia MRN: [id number] DOB: [birth year] Today's Date: 07/08/2020    History of Present Illness Jack Mejia is a 74 y.o. male with medical history significant for paroxysmal atrial fibrillation on anticoagulation, CHF,, CKD 3, hypertension.  History is obtained from chart review and EDP as at the time of my evaluation patient is on BiPAP.Patient presented to the ED with complaints of difficulty breathing over the past month that significantly worsened over the past week.  Patient also has swelling in his lower extremities but this is not new.  He has been taking Demadex at home.  He also noticed blood in his depends, but he denied blood in his urine.    PT Comments    Patient verbalized and demoed increased fatigue throughout session impacting performance throughout. Demo improved balance during ambulation, but cont demo preference for using walls for support vs using RW. Improved strength during sit to stand transfers from bed, chair, and toilet allowing for improved independence. Transition to toilet for bowel movement and close supervision to complete transfer with use of grab bars for support. Patient limited for functional mobility as stated below secondary to BLE weakness, fatigue and poor standing balance.  Patient will benefit from continued physical therapy in hospital and recommended venue below to increase strength, balance, endurance for safe ADLs and gait. Patient tolerated sitting up in chair with family member present after therapy.  Follow Up Recommendations  SNF;Supervision for mobility/OOB;Supervision - Intermittent     Equipment Recommendations  None recommended by PT    Recommendations for Other Services       Precautions / Restrictions Precautions Precautions: Fall Restrictions Weight Bearing Restrictions: No    Mobility  Bed Mobility   Bed Mobility: Supine to Sit     Supine to sit: Supervision;Min  guard;HOB elevated     General bed mobility comments: increased time, labored movement  Transfers Overall transfer level: Needs assistance Equipment used: 1 person hand held assist Transfers: Sit to/from Stand;Stand Pivot Transfers Sit to Stand: Min assist Stand pivot transfers: Min guard;Min assist       General transfer comment: labored movement, decreased eccentric control  Ambulation/Gait Ambulation/Gait assistance: Min assist Gait Distance (Feet): 10 Feet Assistive device: 1 person hand held assist Gait Pattern/deviations: Step-through pattern;Decreased step length - right;Decreased step length - left;Decreased stride length;Shuffle Gait velocity: decreased   General Gait Details: labored, increased c/o buttox pain, pref for using wall for support   Stairs             Wheelchair Mobility    Modified Rankin (Stroke Patients Only)       Balance Overall balance assessment: Mild deficits observed, not formally tested Sitting-balance support: Bilateral upper extremity supported;Feet supported Sitting balance-Leahy Scale: Fair Sitting balance - Comments: seated at EOB   Standing balance support: During functional activity Standing balance-Leahy Scale: Fair Standing balance comment: with 1 hand held assist                            Cognition Arousal/Alertness: Awake/alert Behavior During Therapy: WFL for tasks assessed/performed Overall Cognitive Status: Within Functional Limits for tasks assessed                                        Exercises General Exercises - Lower Extremity Ankle Circles/Pumps: AROM;Strengthening;Both;10 reps Hip Flexion/Marching:  AROM;Strengthening;Both;20 reps Toe Raises: AROM;Strengthening;Both;15 reps Heel Raises: AROM;Strengthening;Both;15 reps    General Comments        Pertinent Vitals/Pain Pain Assessment: Faces Faces Pain Scale: Hurts a little bit Pain Location: bottom Pain Descriptors  / Indicators: Sore    Home Living                      Prior Function            PT Goals (current goals can now be found in the care plan section) Acute Rehab PT Goals Patient Stated Goal: return home after rehab PT Goal Formulation: With patient Time For Goal Achievement: 07/17/20 Potential to Achieve Goals: Good Progress towards PT goals: Progressing toward goals    Frequency    Min 3X/week      PT Plan Current plan remains appropriate    Co-evaluation              AM-PAC PT "6 Clicks" Mobility   Outcome Measure  Help needed turning from your back to your side while in a flat bed without using bedrails?: A Little Help needed moving from lying on your back to sitting on the side of a flat bed without using bedrails?: A Little Help needed moving to and from a bed to a chair (including a wheelchair)?: A Little Help needed standing up from a chair using your arms (e.g., wheelchair or bedside chair)?: A Little Help needed to walk in hospital room?: A Little Help needed climbing 3-5 steps with a railing? : A Lot 6 Click Score: 17    End of Session Equipment Utilized During Treatment: Oxygen Activity Tolerance: Patient tolerated treatment well;Patient limited by fatigue Patient left: in chair;with call bell/phone within reach;with family/visitor present Nurse Communication: Mobility status PT Visit Diagnosis: Unsteadiness on feet (R26.81);Other abnormalities of gait and mobility (R26.89);Muscle weakness (generalized) (M62.81)     Time: 7482-7078 PT Time Calculation (min) (ACUTE ONLY): 30 min  Charges:  $Therapeutic Exercise: 8-22 mins $Therapeutic Activity: 23-37 mins                     12:50 PM,07/08/20 Domenic Moras, PT, DPT Physical Therapist at [facility]

## 2020-07-08 NOTE — TOC Progression Note (Signed)
Transition of Care [facility]) - Progression Note    Patient Details  Name: XACHARY HAMBLY MRN: [id number] Date of Birth: 01-Aug-1945  [facility] CM/SW Contact  Salome Arnt, [place] Phone Number: 07/08/2020, 10:33 AM  Clinical Narrative:  LCSW spoke with pt's significant other, Ann regarding outpatient palliative referral. She is agreeable to refer to [facility]. Referral made and will follow up at [facility]. LCSW updated Marianna Fuss that pt will need another day in hospital. Marianna Fuss to work on authorization as it expired today. LCSW notified PT that pt needs updated therapy notes for authorization. Pt will require new COVID test prior to d/c.      Expected Discharge Plan: [place] Barriers to Discharge: Continued Medical Work up  Expected Discharge Plan and Services Expected Discharge Plan: [place] In-house Referral: Clinical Social Work Discharge Planning Services: CM Consult   Living arrangements for the past 2 months: Single Family Home                 DME Arranged: N/A DME Agency: NA                   Social Determinants of Health (SDOH) Interventions    Readmission Risk Interventions No flowsheet data found.

## 2020-07-08 NOTE — Telephone Encounter (Signed)
-----   Message from Chesley Mires, MD sent at 07/08/2020  7:02 AM EST ----- Hospiral follow up.  Thanks.  V  ----- Message ----- From: Merrilee Seashore, RN Sent: 07/05/2020   3:01 PM EST To: Chesley Mires, MD  This would be hospital follow up or a consult?  ----- Message ----- From: Chesley Mires, MD Sent: 07/05/2020   2:22 PM EST To: Merrilee Seashore, RN  Mr. Baratta is current in [facility] and will be going to SNF for few weeks.  Can you call his family next week to get him set up for an appointment with Melvyn Novas or me at the beginning of January?  He will need a chest xray before his appointment.  Thanks.  V

## 2020-07-08 NOTE — Progress Notes (Signed)
PROGRESS NOTE  Jack Mejia DDU:[id number] DOB: 01/28/1946 DOA: 06/24/2020 PCP: Erven Colla, DO  Brief History:  74 y.o.year old malewith a history of paroxysmal atrial fibrillation on apixaban, CKD stage III, hypertension, diastolic CHF, hyperlipidemia, diabetes mellitus type 2,macrocytic anemia following with hematology s/p iron injection and receiving Retacrit, and adenomatous colon polyps in 2011who was admitted 06/24/20 with sepsis and acuterespiratory failurein the setting of pneumonia and pulmonary edema withacute on chronic diastolic CHF.  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 Melvyn Novas and Dr Halford Chessman -Stopped amiodarone -Treated with IV Solu-Medrol started 06/28/2020 through 07/02/2020,  -Transition to prednisone 40 mg daily on 12/8 with plans to decrease by 10 mg every 7 days -Oxygen has been weaned down to less than 2 L/min from 15 L last week No longer requiring BiPAP -Wean oxygen as tolerated for saturation greater 92% BC x 2  11/29 >>> RESP viral Panel  PCR 11/29 neg MRSA PCR   11/30  Neg Strep urinary ag  11/30 neg  Legionella urinary ag  11/30 >>>neg  -Overall significant improvement since starting steroids on 06/28/2020 -Repeat chest x-ray on 07/02/2020 shows improvement -  Acute on chronic diastolic CHF -Volume overload status-worsened with transfusion of PRBC -Continues to have volume overload and shortness of breath -Continue Bumex twice daily -Continue on fluid restriction -Plan will be to  discharge on p.o. torsemide as patient has tolerated this in the past -Daily weights -Accurate I's and O's -05/08/2020 echo EF 65 to 70%, no WMA, grade 1 DD, trivial MR, moderate AAS --BNP is elevated  DM2- last A1c 8.3 reflecting uncontrolled DM with hyperglycemia -NovoLog sliding scale -Holding glipizide -Blood sugars uncontrolled with hyperglycemia, likely related to steroids -Adjusting Levemir and NovoLog with meals  CAP/Lobar pneumonia -COVID-19 PCR negative -Patient completed ceftriaxone and doxycycline -Continue bronchodilators and mucolytics -Repeat chest x-ray from 07/02/2020 noted  Severe sepsis -Present on admission -Presented with leukocytosis and tachypnea -Secondary to pneumonia as above -Lactic acid peaked 3.4 -Completed antibiotics as above -Sepsis pathophysiology appears to have resolved  Acute on chronic renal failure CKD stage IIIb -Baseline creatinine 2.0-2.2 -Presented with serum creatinine 2.51 -Monitor closely with diuresis AKI----acute kidney injury on CKD stage - 3B--due to sepsis and hypoperfusion -  creatinine on admission=2.51  , baseline creatinine =2.0     , creatinine is now=1.5 ----renally adjust medications, avoid nephrotoxic agents / dehydration  / hypotension  Hematochezia/ABLA -GI consult -baseline Hgb 9-10 -presented with Hgb 7.5 -Received a total of 2 units of PRBC this admission -Follow-up hemoglobin improved to 9 and has been fluctuating between 8 and 9 -GI consult appreciated -Patient's cardiopulmonary  status precludes endoluminal evaluation -Advance to GI soft diet  Paroxysmal Atrial Fibrillation -holding apixaban due to hematochezia -Continue Cardizem CD -Stopped amiodarone due to concerns about possible amiodarone induced pneumonitis  Essential Hypertension--stable -Continue diltiazem CD -Continue metoprolol heart rate  Hyperlipidemia -continue statin  Hypokalemia/Hypomagnesemia--- replace and recheck, electrolytes  abnormalities may persist due to high-dose diuretic use  Leukocytosis -Chart reviewed and patient has seen hem/onc in the past for macrocytic anemia -He had a bone marrow biopsy done in 03/2020  that showed hypercellular marrow with trilineage dysplasia -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 Chryl Heck, oncology who will order some further work-up  Social/ethics--plan of care, advanced directives and goals of care discussed with patient and patient's 2 daughters [name] and [name] -Palliative care consult appreciated patient is a DNR/DNI with full scope of treatment  Disposition--- seen by physical therapy with recommendations for skilled nursing facility placement  Status is: Inpatient  Remains inpatient appropriate because:Acute hypoxic respiratory failure requiring IV diuretic and IV steroids   Dispo: The patient is from: Home              Anticipated d/c is to: SNF              Anticipated d/c date is: 2 days              Patient currently is not medically stable to d/c.  Family Communication: -Discussed with patient   Consultants:  Pulm  Code Status:    Code Status: DNR  DVT Prophylaxis: SCDs  Procedures: As Listed in Progress Note Above  Antibiotics: Rocephin/doxy-completed  Subjective: Says he does not feel well today.  Does not clarify further as to what is making him feel unwell  Objective: Vitals:   07/08/20 0635 07/08/20 0927 07/08/20 1434 07/08/20 1700  BP: 123/69 129/62 (!) 128/58 (!) 135/56  Pulse: 63 60 (!) 53 (!) 51  Resp: _0 Temp: 97.9 F (36.6 C)  98.1 F (36.7 C) 97.9 F (36.6 C)  TempSrc: Oral  Oral   SpO2: 95%  100% 99%  Weight: 92.6 kg     Height:        Intake/Output Summary (Last 24 hours) at 07/08/2020 1708 Last data filed at 07/08/2020  1400 Gross per 24 hour  Intake 720 ml  Output 3400 ml  Net -2680 ml   Weight change: -1.9 kg Exam:  General exam: Alert, awake, oriented x 3 Respiratory system: Crackles at bases. Respiratory effort normal. Cardiovascular system:RRR. No murmurs, rubs, gallops. Gastrointestinal system: Abdomen is nondistended, soft and nontender. No organomegaly or masses felt. Normal bowel sounds heard. Central nervous system: Alert and oriented. No focal neurological deficits. Extremities: Right hand swelling is better today, the left forearm swelling persists Skin: No rashes, lesions or ulcers Psychiatry: Judgement and insight appear normal. Mood & affect appropriate.    Data Reviewed: I have personally reviewed following labs and imaging studies  Basic Metabolic Panel: Recent Labs  Lab 07/04/20 0433 07/05/20 0615 07/06/20 0521 07/07/20 0756 07/08/20 0730  NA 131* 134* 134* 128* 136  K 3.9 3.6 3.4* 3.9 4.3  CL 97* 97* 98 92* 95*  CO2 _1 32  GLUCOSE 257* 136* 94 106* 131*  BUN 51* 50* 43* 18 39*  CREATININE 1.74* 1.52* 1.34* 0.91 1.52*  CALCIUM 8.2* 8.2* 8.2* 8.3* 8.5*   Liver Function Tests: Recent Labs  Lab 07/08/20 0730  AST 52*  ALT 88*  ALKPHOS 204*  BILITOT 1.0  PROT 5.9*  ALBUMIN 2.6*   No results for input(s): LIPASE, AMYLASE in the last 168 hours. No results for input(s): AMMONIA in the last 168 hours. Coagulation Profile: No results for input(s): INR, PROTIME in the last 168 hours. CBC: Recent Labs  Lab 07/02/20 0442 07/04/20 0433 07/08/20 0730 07/08/20 1559  WBC 16.4* 27.5* 39.1* 44.5*  NEUTROABS  --   --   --  31.6*  HGB 8.7* 8.6* 9.3* 9.3*  HCT 28.3* 27.5* 29.5* 29.3*  MCV 103.3* 102.6* 105.4* 104.3*  PLT 172 174 144* 144*   Cardiac Enzymes: No results for input(s): CKTOTAL, CKMB, CKMBINDEX, TROPONINI in the last 168 hours. BNP: Invalid input(s): POCBNP CBG: Recent Labs  Lab 07/07/20 1640 07/07/20 2050 07/08/20 0807 07/08/20 1155  07/08/20 1659  GLUCAP 200* 160* 119* 186* 229*   HbA1C: No results for input(s): HGBA1C in the last 72 hours. Urine analysis:    Component Value Date/Time   COLORURINE COLORLESS (A) 06/06/2020 1151   APPEARANCEUR CLEAR 06/06/2020 1151   LABSPEC 1.013 06/06/2020 1151   PHURINE 5.0 06/06/2020 1151   GLUCOSEU >=500 (A) 06/06/2020 1151   HGBUR MODERATE (A) 06/06/2020 1151   BILIRUBINUR NEGATIVE 06/06/2020 1151   KETONESUR NEGATIVE 06/06/2020 1151   PROTEINUR NEGATIVE 06/06/2020 1151   NITRITE NEGATIVE 06/06/2020 1151   LEUKOCYTESUR NEGATIVE 06/06/2020 1151   Sepsis Labs: _0 (procalcitonin:4,lacticidven:4) ) No results found for this or any previous visit (from the past 240 hour(s)).   Scheduled Meds: . atorvastatin  20 mg Oral Daily  . bumetanide (BUMEX) IV  1 mg Intravenous Q12H  . Chlorhexidine Gluconate Cloth  6 each Topical Daily  . diltiazem  360 mg Oral Daily  . feeding supplement (GLUCERNA SHAKE)  237 mL Oral TID  . hydrocortisone   Rectal BID  . insulin aspart  0-20 Units Subcutaneous TID WC  . insulin aspart  0-5 Units Subcutaneous QHS  . insulin aspart  15 Units Subcutaneous TID WC  . insulin detemir  15 Units Subcutaneous Daily  . mouth rinse  15 mL Mouth Rinse BID  . metoprolol tartrate  25 mg Oral BID  . pantoprazole  40 mg Oral BID AC  . predniSONE  40 mg Oral Q breakfast   Continuous Infusions:   Procedures/Studies: US RENAL  Result Date: 06/17/2020 CLINICAL DATA:  Chronic renal disease stage IV EXAM: RENAL / URINARY TRACT ULTRASOUND COMPLETE COMPARISON:  April 21, 2019 FINDINGS: Right Kidney: Renal measurements: 10.7 cm x 5.2 cm x 5.5 cm = volume: 162.9 mL. Echogenicity within normal limits. A 3.1 cm x 3.3 cm x 2.7 cm complex anechoic structure is seen within the mid right kidney. No hydronephrosis is visualized. Left Kidney: Renal measurements: 12.3 cm x 7.2 cm x 6.1 cm = volume: 283.3 mL. Echogenicity within normal limits. 2.1 cm x 2.1 cm x  2.3 cm and 3.0 cm x 2.1 cm x 2.4 cm anechoic structures are seen within the mid and lower left kidney. An 8.5 mm shadowing echogenic focus is also seen within the lower pole of the left kidney. No hydronephrosis is visualized. Bladder: Appears normal for degree of bladder distention. Bilateral ureteral jets are visualized. Other: None. IMPRESSION: 1. Bilateral renal cysts, mildly increased in size when compared to the prior study. 2. Subcentimeter non-obstructing renal stone within the lower pole of the left kidney. Electronically Signed   By: Virgina Norfolk M.D.   On: 06/17/2020 23:57   US Venous Img Upper Uni Right(DVT)  Result Date: 07/04/2020 CLINICAL DATA:  Right upper extremity edema. History of diabetes. Former smoker. Evaluate for DVT. EXAM: RIGHT UPPER EXTREMITY VENOUS DOPPLER ULTRASOUND TECHNIQUE: Gray-scale sonography with graded compression, as well as color Doppler and duplex ultrasound were performed to evaluate the upper extremity deep venous system from the level of the subclavian vein and including the jugular,  axillary, basilic, radial, ulnar and upper cephalic vein. Spectral Doppler was utilized to evaluate flow at rest and with distal augmentation maneuvers. COMPARISON:  None. FINDINGS: Contralateral Subclavian Vein: Respiratory phasicity is normal and symmetric with the symptomatic side. No evidence of thrombus. Normal compressibility. Internal Jugular Vein: No evidence of thrombus. Normal compressibility, respiratory phasicity and response to augmentation. Subclavian Vein: No evidence of thrombus. Normal compressibility, respiratory phasicity and response to augmentation. Axillary Vein: No evidence of thrombus. Normal compressibility, respiratory phasicity and response to augmentation. Cephalic Vein: No evidence of thrombus. Normal compressibility, respiratory phasicity and response to augmentation. Basilic Vein: Not well visualized. Brachial Veins: No evidence of thrombus. Normal  compressibility, respiratory phasicity and response to augmentation. Radial Veins: No evidence of thrombus. Normal compressibility, respiratory phasicity and response to augmentation. Ulnar Veins: No evidence of thrombus. Normal compressibility, respiratory phasicity and response to augmentation. Venous Reflux:  None visualized. Other Findings: There is a large amount of subcutaneous edema at the level of the right forearm (images 34 through 38). IMPRESSION: 1. No evidence of DVT within the right upper extremity. 2. Large amount of subcutaneous edema at the level of the right forearm Electronically Signed   By: Sandi Mariscal M.D.   On: 07/04/2020 15:16   DG Chest Port 1 View  Result Date: 07/02/2020 CLINICAL DATA:  Pneumonitis. EXAM: PORTABLE CHEST 1 VIEW COMPARISON:  06/30/2020. FINDINGS: Cardiomegaly. Diffuse bilateral interstitial infiltrates again noted. Slight improvement from prior exam. No pleural effusion or pneumothorax. Thoracic spine scoliosis and degenerative change. IMPRESSION: 1. Cardiomegaly. 2. Diffuse bilateral interstitial infiltrates again noted. Slight improvement from prior exam. Electronically Signed   By: Marcello Moores  Register   On: 07/02/2020 07:30   DG CHEST PORT 1 VIEW  Result Date: 06/30/2020 CLINICAL DATA:  Sepsis, acute respiratory failure, pneumonia and pulmonary edema, acute on chronic CHF. EXAM: PORTABLE CHEST 1 VIEW COMPARISON:  Chest x-ray dated 06/27/2020 FINDINGS: Persistent bilateral airspace opacities, but slightly improved aeration of both lungs. No pleural effusion or pneumothorax is seen. Heart size and mediastinal contours are grossly stable. IMPRESSION: Slightly improved aeration of both lungs, but with persistent bilateral airspace opacities, compatible with multifocal pneumonia versus pulmonary edema. Electronically Signed   By: Franki Cabot M.D.   On: 06/30/2020 05:49   DG CHEST PORT 1 VIEW  Result Date: 06/27/2020 CLINICAL DATA:  Shortness of breath. EXAM:  PORTABLE CHEST 1 VIEW COMPARISON:  06/24/2020.  CT abdomen 06/19/2014. FINDINGS: Stable cardiomegaly. Progressive diffuse severe bilateral pulmonary infiltrates/edema. No prominent pleural effusion. No pneumothorax. Stable calcific densities noted over the right upper quadrant, most likely dystrophic calcification. Degenerative change thoracic spine and both shoulders. IMPRESSION: 1. Stable cardiomegaly. 2. Progressive diffuse severe bilateral pulmonary infiltrates/edema. Electronically Signed   By: Marcello Moores  Register   On: 06/27/2020 05:56   DG Chest Port 1 View  Result Date: 06/24/2020 CLINICAL DATA:  Shortness of breath. EXAM: PORTABLE CHEST 1 VIEW COMPARISON:  Dec 12, 2019. FINDINGS: Stable cardiomediastinal silhouette. No pneumothorax or pleural effusion is noted. Bilateral patchy airspace opacities are now noted concerning for multifocal pneumonia. Bony thorax is unremarkable IMPRESSION: Bilateral patchy airspace opacities are noted concerning for multifocal pneumonia. Electronically Signed   By: Marijo Conception M.D.   On: 06/24/2020 11:37   US Abdomen Limited RUQ (LIVER/GB)  Result Date: 06/25/2020 CLINICAL DATA:  Elevated LFTs, post cholecystectomy EXAM: ULTRASOUND ABDOMEN LIMITED RIGHT UPPER QUADRANT COMPARISON:  September of 2018 CT evaluation FINDINGS: Gallbladder: Post cholecystectomy. Common bile duct: Diameter: 4.2 mm Liver: Mildly increased  echogenicity of hepatic parenchyma without visible lesion on limited images. Portal vein is patent on color Doppler imaging with normal direction of blood flow towards the liver. Other: RIGHT pleural effusion, may be moderate. IMPRESSION: 1. Hepatic steatosis, mild. 2. No biliary duct dilation post cholecystectomy. 3. RIGHT-sided pleural effusion partially imaged. Electronically Signed   By: Zetta Bills M.D.   On: 06/25/2020 15:07    Kathie Dike, MD  [facility]  If 7PM-7AM, please contact night-coverage [email or web address]  07/08/2020, 5:08  PM   LOS: 14 days

## 2020-07-08 NOTE — Telephone Encounter (Signed)
Called and spoke with Jack Mejia and scheduled hospital follow up office visit for patient with Dr Melvyn Novas at the [place] office on Monday 08/05/2020 at [facility] aware of chest xray needing to be done prior to next appointment per Dr Halford Chessman. Order placed per Dr Halford Chessman. Ann agreeable to time, date and location. Nothing further needed at this time.

## 2020-07-09 LAB — GLUCOSE, CAPILLARY
Glucose-Capillary: 194 mg/dL — ABNORMAL HIGH (ref 70–99)
Glucose-Capillary: 309 mg/dL — ABNORMAL HIGH (ref 70–99)
Glucose-Capillary: 194 mg/dL — ABNORMAL HIGH (ref 70–99)
Glucose-Capillary: 134 mg/dL — ABNORMAL HIGH (ref 70–99)

## 2020-07-09 LAB — CBC WITH DIFFERENTIAL/PLATELET
Band Neutrophils: 3 %
Basophils Absolute: 0 10*3/uL (ref 0.0–0.1)
Basophils Relative: 0 %
Eosinophils Absolute: 0 10*3/uL (ref 0.0–0.5)
Eosinophils Relative: 0 %
HCT: 27.6 % — ABNORMAL LOW (ref 39.0–52.0)
Hemoglobin: 8.7 g/dL — ABNORMAL LOW (ref 13.0–17.0)
Lymphocytes Relative: 9 %
Lymphs Abs: 3.4 10*3/uL (ref 0.7–4.0)
MCH: 32.8 pg (ref 26.0–34.0)
MCHC: 31.5 g/dL (ref 30.0–36.0)
MCV: 104.2 fL — ABNORMAL HIGH (ref 80.0–100.0)
Metamyelocytes Relative: 5 %
Monocytes Absolute: 4.1 10*3/uL — ABNORMAL HIGH (ref 0.1–1.0)
Monocytes Relative: 11 %
Myelocytes: 4 %
Neutro Abs: 26.6 10*3/uL — ABNORMAL HIGH (ref 1.7–7.7)
Neutrophils Relative %: 68 %
Platelets: 135 10*3/uL — ABNORMAL LOW (ref 150–400)
RBC: 2.65 MIL/uL — ABNORMAL LOW (ref 4.22–5.81)
RDW: 20.4 % — ABNORMAL HIGH (ref 11.5–15.5)
WBC: 37.4 10*3/uL — ABNORMAL HIGH (ref 4.0–10.5)
nRBC: 0 % (ref 0.0–0.2)

## 2020-07-09 LAB — BASIC METABOLIC PANEL
Anion gap: 9 (ref 5–15)
BUN: 44 mg/dL — ABNORMAL HIGH (ref 8–23)
CO2: 32 mmol/L (ref 22–32)
Calcium: 8.4 mg/dL — ABNORMAL LOW (ref 8.9–10.3)
Chloride: 91 mmol/L — ABNORMAL LOW (ref 98–111)
Creatinine, Ser: 1.65 mg/dL — ABNORMAL HIGH (ref 0.61–1.24)
GFR, Estimated: 43 mL/min — ABNORMAL LOW (ref >60)
Glucose, Bld: 231 mg/dL — ABNORMAL HIGH (ref 70–99)
Potassium: 4 mmol/L (ref 3.5–5.1)
Sodium: 132 mmol/L — ABNORMAL LOW (ref 135–145)

## 2020-07-09 LAB — PATHOLOGIST SMEAR REVIEW

## 2020-07-09 MED ORDER — PREDNISONE 20 MG PO TABS
30.0000 mg | ORAL_TABLET | Freq: Every day | ORAL | Status: DC
  Administered 2020-07-10: 30 mg via ORAL
  Filled 2020-07-09: qty 1

## 2020-07-09 NOTE — Progress Notes (Signed)
PROGRESS NOTE  Jack Mejia JSR:[id number] DOB: [birth year] DOA: 06/24/2020 PCP: Erven Colla, DO  Brief History:  74 y.o.year old malewith a history of paroxysmal atrial fibrillation on apixaban, CKD stage III, hypertension, diastolic CHF, hyperlipidemia, diabetes mellitus type 2,macrocytic anemia following with hematology s/p iron injection and receiving Retacrit, and adenomatous colon polyps in 2011who was admitted 06/24/20 with sepsis and acuterespiratory failurein the setting of pneumonia and pulmonary edema withacute on chronic diastolic CHF.  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 Melvyn Novas and Dr Halford Chessman -Stopped amiodarone -Treated with IV Solu-Medrol started 06/28/2020 through 07/02/2020,  -Transitioned to prednisone 40 mg daily on 12/8 with plans to decrease by 10 mg every 7 days -Oxygen has been weaned down to less than 2 L/min from 15 L last week No longer requiring BiPAP -Wean oxygen as tolerated for saturation greater 92% BC x 2  11/29 >>> RESP viral Panel  PCR 11/29 neg MRSA PCR   11/30  Neg Strep urinary ag  11/30 neg  Legionella urinary ag  11/30 >>>neg  -Overall significant improvement since starting steroids on 06/28/2020 -Repeat chest x-ray on 07/02/2020 shows improvement -Follow-up appointment with pulmonology has been scheduled   Acute on chronic diastolic CHF -Volume overload status-worsened with transfusion of PRBC -Continue Bumex twice daily -Continue on fluid restriction -Plan will be to  discharge on p.o. torsemide as patient has tolerated this in the past -Daily weights -Accurate I's and O's -05/08/2020 echo EF 65 to 70%, no WMA, grade 1 DD, trivial MR, moderate AAS --BNP is elevated -Currently, volume status improving -Can likely transition to oral Demadex in a.m. if he is discharging  DM2- last A1c 8.3 reflecting uncontrolled DM with hyperglycemia -NovoLog sliding scale -Holding glipizide -Blood sugars uncontrolled with hyperglycemia, likely related to steroids -Adjusting Levemir and NovoLog with meals  CAP/Lobar pneumonia -COVID-19 PCR negative -Patient completed ceftriaxone and doxycycline -Continue bronchodilators and mucolytics -Repeat chest x-ray from 07/02/2020 noted  Severe sepsis -Present on admission -Presented with leukocytosis and tachypnea -Secondary to pneumonia as above -Lactic acid peaked 3.4 -Completed antibiotics as above -Sepsis pathophysiology appears to have resolved  Acute on chronic renal failure CKD stage IIIb -Baseline creatinine 2.0-2.2 -Presented with serum creatinine 2.51 -Monitor closely with diuresis AKI----acute kidney injury on CKD stage - 3B--due to sepsis and hypoperfusion -  creatinine on admission=2.51  , baseline creatinine =2.0     , creatinine is now=1.6 ----renally adjust medications, avoid nephrotoxic agents / dehydration  / hypotension  Hematochezia/ABLA -GI consult -baseline Hgb 9-10 -presented with Hgb 7.5 -Received a total of 2 units of PRBC this admission -Follow-up hemoglobin improved to 9 and has been fluctuating between 8 and 9 -GI consult appreciated -Patient's cardiopulmonary  status precludes endoluminal evaluation -Advance to GI soft diet  Paroxysmal Atrial Fibrillation -holding apixaban due to hematochezia -Discussed with GI, Dr. Jenetta Downer who agreed that it would be reasonable to hold anticoagulation until he can be seen in follow-up by GI and further work-up including endoluminal evaluation can  be performed -Close follow-up with GI will be scheduled -Continue Cardizem CD -Stopped amiodarone due to concerns about possible amiodarone  induced pneumonitis  Essential Hypertension--stable -Continue diltiazem CD -Continue metoprolol heart rate  Hyperlipidemia -continue statin  Hypokalemia/Hypomagnesemia--- replace and recheck, electrolytes abnormalities may persist due to high-dose diuretic use  Leukocytosis -Chart reviewed and patient has seen hem/onc in the past for macrocytic anemia -He had a bone marrow biopsy done in 03/2020 that showed hypercellular marrow with trilineage dysplasia -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 Chryl Heck and Dr. Burr Medico, oncology who felt it was reasonable to discharge the patient in follow-up further work-up as an outpatient -He will be arranged to be seen in the oncology clinic  Social/ethics--plan of care, advanced directives and goals of care discussed with patient and patient's 2 daughters Jack Mejia and Jack Mejia -Palliative care consult appreciated patient is a DNR/DNI with full scope of treatment  Disposition--- seen by physical therapy with recommendations for skilled nursing facility placement  Status is: Inpatient  Remains inpatient appropriate because:Awaiting insurance authorization for skilled nursing facility   Dispo: The patient is from: Home              Anticipated d/c is to: SNF              Anticipated d/c date is: 1 day              Patient currently is medically stable to d/c.  Family Communication: -Discussed with patient   Consultants:  Pulm  Code Status:    Code Status: DNR  DVT Prophylaxis: SCDs  Procedures: As Listed in Progress Note Above  Antibiotics: Rocephin/doxy-completed  Subjective: He is feeling better today.   Shortness of breath improving.  No chest pain  Objective: Vitals:   07/08/20 1700 07/08/20 2138 07/09/20 0647 07/09/20 1408  BP: (!) 135/56 (!) 132/51  (!) 141/63  Pulse: (!) 51 60  (!) 58  Resp: _0 Temp: 97.9 F (36.6 C) 98.1 F (36.7 C)  98 F (36.7 C)  TempSrc:  Oral  Oral  SpO2: 99% 98%  98%  Weight:   91 kg   Height:        Intake/Output Summary (Last 24 hours) at 07/09/2020 2115 Last data filed at 07/09/2020 1322 Gross per 24 hour  Intake 720 ml  Output 1100 ml  Net -380 ml   Weight change: -1.6 kg Exam:  General exam: Alert, awake, oriented x 3 Respiratory system: Clear bilaterally. Respiratory effort normal. Cardiovascular system:RRR. No murmurs, rubs, gallops. Gastrointestinal system: Abdomen is nondistended, soft and nontender. No organomegaly or masses felt. Normal bowel sounds heard. Central nervous system: Alert and oriented. No focal neurological deficits. Extremities: Swelling of right upper extremity improving Skin: No rashes, lesions or ulcers Psychiatry: Judgement and insight appear normal. Mood & affect appropriate.    Data Reviewed: I have personally reviewed following labs and imaging studies  Basic Metabolic Panel: Recent Labs  Lab 07/05/20 0615 07/06/20 0521 07/07/20 0756 07/08/20 0730 07/09/20 0650  NA 134* 134* 128* 136 132*  K 3.6 3.4* 3.9 4.3 4.0  CL 97* 98 92* 95* 91*  CO2 _1 32 32  GLUCOSE 136* 94 106* 131* 231*  BUN 50* 43* 18 39* 44*  CREATININE 1.52* 1.34* 0.91 1.52* 1.65*  CALCIUM 8.2* 8.2* 8.3* 8.5* 8.4*   Liver Function Tests: Recent Labs  Lab 07/08/20 0730  AST 52*  ALT 88*  ALKPHOS 204*  BILITOT 1.0  PROT 5.9*  ALBUMIN 2.6*   No results for input(s): LIPASE, AMYLASE in the last 168 hours. No results for input(s): AMMONIA in the last 168 hours. Coagulation Profile: No results for input(s): INR, PROTIME in the last 168 hours. CBC: Recent Labs  Lab 07/04/20 0433 07/08/20 0730 07/08/20 1559  07/09/20 0650  WBC 27.5* 39.1* 44.5* 37.4*  NEUTROABS  --   --  31.6* 26.6*  HGB 8.6* 9.3* 9.3* 8.7*  HCT 27.5* 29.5* 29.3* 27.6*  MCV 102.6* 105.4* 104.3* 104.2*  PLT 174 144* 144* 135*   Cardiac Enzymes: No results for input(s): CKTOTAL, CKMB, CKMBINDEX, TROPONINI in the last 168 hours. BNP: Invalid input(s): POCBNP CBG: Recent Labs  Lab 07/08/20 1659 07/08/20 2135 07/09/20 0742 07/09/20 1125 07/09/20 1623  GLUCAP 229* 269* 194* 309* 194*   HbA1C: No results for input(s): HGBA1C in the last 72 hours. Urine analysis:    Component Value Date/Time   COLORURINE COLORLESS (A) 06/06/2020 1151   APPEARANCEUR CLEAR 06/06/2020 1151   LABSPEC 1.013 06/06/2020 1151   PHURINE 5.0 06/06/2020 1151   GLUCOSEU >=500 (A) 06/06/2020 1151   HGBUR MODERATE (A) 06/06/2020 1151   BILIRUBINUR NEGATIVE 06/06/2020 1151   KETONESUR NEGATIVE 06/06/2020 1151   PROTEINUR NEGATIVE 06/06/2020 1151   NITRITE NEGATIVE 06/06/2020 1151   LEUKOCYTESUR NEGATIVE 06/06/2020 1151   Sepsis Labs: _0 (procalcitonin:4,lacticidven:4) ) No results found for this or any previous visit (from the past 240 hour(s)).   Scheduled Meds: . atorvastatin  20 mg Oral Daily  . bumetanide (BUMEX) IV  1 mg Intravenous Q12H  . Chlorhexidine Gluconate Cloth  6 each Topical Daily  . diltiazem  360 mg Oral Daily  . feeding supplement (GLUCERNA SHAKE)  237 mL Oral TID  . hydrocortisone   Rectal BID  . insulin aspart  0-20 Units Subcutaneous TID WC  . insulin aspart  0-5 Units Subcutaneous QHS  . insulin aspart  15 Units Subcutaneous TID WC  . insulin detemir  15 Units Subcutaneous Daily  . mouth rinse  15 mL Mouth Rinse BID  . metoprolol tartrate  25 mg Oral BID  . pantoprazole  40 mg Oral BID AC   Continuous Infusions: . [START ON 07/10/2020] predniSONE      Procedures/Studies: US RENAL  Result Date: 06/17/2020 CLINICAL DATA:  Chronic renal disease stage IV EXAM: RENAL / URINARY TRACT ULTRASOUND  COMPLETE COMPARISON:  April 21, 2019 FINDINGS: Right Kidney: Renal measurements: 10.7 cm x 5.2 cm x 5.5 cm = volume: 162.9 mL. Echogenicity within normal limits. A 3.1 cm x 3.3 cm x 2.7 cm complex anechoic structure is seen within the mid right kidney. No hydronephrosis is visualized. Left Kidney: Renal measurements: 12.3 cm x 7.2 cm x 6.1 cm = volume: 283.3 mL. Echogenicity within normal limits. 2.1 cm x 2.1 cm x 2.3 cm and 3.0 cm x 2.1 cm x 2.4 cm anechoic structures are seen within the mid and lower left kidney. An 8.5 mm shadowing echogenic focus is also seen within the lower pole of the left kidney. No hydronephrosis is visualized. Bladder: Appears normal for degree of bladder distention. Bilateral ureteral jets are visualized. Other: None. IMPRESSION: 1. Bilateral renal cysts, mildly increased in size when compared to the prior study. 2. Subcentimeter non-obstructing renal stone within the lower pole of the left kidney. Electronically Signed   By: Virgina Norfolk M.D.   On: 06/17/2020 23:57   US Venous Img Upper Uni Right(DVT)  Result Date:  07/04/2020 CLINICAL DATA:  Right upper extremity edema. History of diabetes. Former smoker. Evaluate for DVT. EXAM: RIGHT UPPER EXTREMITY VENOUS DOPPLER ULTRASOUND TECHNIQUE: Gray-scale sonography with graded compression, as well as color Doppler and duplex ultrasound were performed to evaluate the upper extremity deep venous system from the level of the subclavian vein and including the jugular, axillary, basilic, radial, ulnar and upper cephalic vein. Spectral Doppler was utilized to evaluate flow at rest and with distal augmentation maneuvers. COMPARISON:  None. FINDINGS: Contralateral Subclavian Vein: Respiratory phasicity is normal and symmetric with the symptomatic side. No evidence of thrombus. Normal compressibility. Internal Jugular Vein: No evidence of thrombus. Normal compressibility, respiratory phasicity and response to augmentation. Subclavian Vein:  No evidence of thrombus. Normal compressibility, respiratory phasicity and response to augmentation. Axillary Vein: No evidence of thrombus. Normal compressibility, respiratory phasicity and response to augmentation. Cephalic Vein: No evidence of thrombus. Normal compressibility, respiratory phasicity and response to augmentation. Basilic Vein: Not well visualized. Brachial Veins: No evidence of thrombus. Normal compressibility, respiratory phasicity and response to augmentation. Radial Veins: No evidence of thrombus. Normal compressibility, respiratory phasicity and response to augmentation. Ulnar Veins: No evidence of thrombus. Normal compressibility, respiratory phasicity and response to augmentation. Venous Reflux:  None visualized. Other Findings: There is a large amount of subcutaneous edema at the level of the right forearm (images 34 through 38). IMPRESSION: 1. No evidence of DVT within the right upper extremity. 2. Large amount of subcutaneous edema at the level of the right forearm Electronically Signed   By: Simonne Come M.D.   On: 07/04/2020 15:16   DG Chest Port 1 View  Result Date: 07/02/2020 CLINICAL DATA:  Pneumonitis. EXAM: PORTABLE CHEST 1 VIEW COMPARISON:  06/30/2020. FINDINGS: Cardiomegaly. Diffuse bilateral interstitial infiltrates again noted. Slight improvement from prior exam. No pleural effusion or pneumothorax. Thoracic spine scoliosis and degenerative change. IMPRESSION: 1. Cardiomegaly. 2. Diffuse bilateral interstitial infiltrates again noted. Slight improvement from prior exam. Electronically Signed   By: Maisie Fus  Register   On: 07/02/2020 07:30   DG CHEST PORT 1 VIEW  Result Date: 06/30/2020 CLINICAL DATA:  Sepsis, acute respiratory failure, pneumonia and pulmonary edema, acute on chronic CHF. EXAM: PORTABLE CHEST 1 VIEW COMPARISON:  Chest x-ray dated 06/27/2020 FINDINGS: Persistent bilateral airspace opacities, but slightly improved aeration of both lungs. No pleural effusion or  pneumothorax is seen. Heart size and mediastinal contours are grossly stable. IMPRESSION: Slightly improved aeration of both lungs, but with persistent bilateral airspace opacities, compatible with multifocal pneumonia versus pulmonary edema. Electronically Signed   By: Bary Richard M.D.   On: 06/30/2020 05:49   DG CHEST PORT 1 VIEW  Result Date: 06/27/2020 CLINICAL DATA:  Shortness of breath. EXAM: PORTABLE CHEST 1 VIEW COMPARISON:  06/24/2020.  CT abdomen 06/19/2014. FINDINGS: Stable cardiomegaly. Progressive diffuse severe bilateral pulmonary infiltrates/edema. No prominent pleural effusion. No pneumothorax. Stable calcific densities noted over the right upper quadrant, most likely dystrophic calcification. Degenerative change thoracic spine and both shoulders. IMPRESSION: 1. Stable cardiomegaly. 2. Progressive diffuse severe bilateral pulmonary infiltrates/edema. Electronically Signed   By: Maisie Fus  Register   On: 06/27/2020 05:56   DG Chest Port 1 View  Result Date: 06/24/2020 CLINICAL DATA:  Shortness of breath. EXAM: PORTABLE CHEST 1 VIEW COMPARISON:  Dec 12, 2019. FINDINGS: Stable cardiomediastinal silhouette. No pneumothorax or pleural effusion is noted. Bilateral patchy airspace opacities are now noted concerning for multifocal pneumonia. Bony thorax is unremarkable IMPRESSION: Bilateral patchy airspace opacities are noted concerning for multifocal pneumonia. Electronically Signed  By: Marijo Conception M.D.   On: 06/24/2020 11:37   US Abdomen Limited RUQ (LIVER/GB)  Result Date: 06/25/2020 CLINICAL DATA:  Elevated LFTs, post cholecystectomy EXAM: ULTRASOUND ABDOMEN LIMITED RIGHT UPPER QUADRANT COMPARISON:  September of 2018 CT evaluation FINDINGS: Gallbladder: Post cholecystectomy. Common bile duct: Diameter: 4.2 mm Liver: Mildly increased echogenicity of hepatic parenchyma without visible lesion on limited images. Portal vein is patent on color Doppler imaging with normal direction of blood  flow towards the liver. Other: RIGHT pleural effusion, may be moderate. IMPRESSION: 1. Hepatic steatosis, mild. 2. No biliary duct dilation post cholecystectomy. 3. RIGHT-sided pleural effusion partially imaged. Electronically Signed   By: Zetta Bills M.D.   On: 06/25/2020 15:07    Kathie Dike, MD  [facility]  If 7PM-7AM, please contact night-coverage [email or web address]  07/09/2020, 9:15 PM   LOS: 15 days

## 2020-07-09 NOTE — Plan of Care (Signed)
  Problem: Education: Goal: Knowledge of General Education information will improve Description: Including pain rating scale, medication(s)/side effects and non-pharmacologic comfort measures Outcome: Progressing   Problem: Health Behavior/Discharge Planning: Goal: Ability to manage health-related needs will improve Outcome: Progressing   Problem: Clinical Measurements: Goal: Ability to maintain clinical measurements within normal limits will improve Outcome: Progressing Goal: Will remain free from infection Outcome: Progressing Goal: Diagnostic test results will improve Outcome: Progressing Goal: Respiratory complications will improve Outcome: Progressing Goal: Cardiovascular complication will be avoided Outcome: Progressing   Problem: Activity: Goal: Risk for activity intolerance will decrease Outcome: Progressing   Problem: Nutrition: Goal: Adequate nutrition will be maintained Outcome: Progressing   Problem: Coping: Goal: Level of anxiety will decrease Outcome: Progressing   Problem: Elimination: Goal: Will not experience complications related to bowel motility Outcome: Progressing Goal: Will not experience complications related to urinary retention Outcome: Progressing   Problem: Pain Managment: Goal: General experience of comfort will improve Outcome: Progressing   Problem: Safety: Goal: Ability to remain free from injury will improve Outcome: Progressing   Problem: Skin Integrity: Goal: Risk for impaired skin integrity will decrease Outcome: Progressing   Problem: Cardiac: Goal: Ability to achieve and maintain adequate cardiopulmonary perfusion will improve Outcome: Progressing   Problem: Education: Goal: Ability to identify signs and symptoms of gastrointestinal bleeding will improve Outcome: Progressing   Problem: Bowel/Gastric: Goal: Will show no signs and symptoms of gastrointestinal bleeding Outcome: Progressing   Problem: Fluid  Volume: Goal: Compliance with measures to maintain balanced fluid volume will improve Outcome: Progressing   Problem: Cardiac: Goal: Ability to achieve and maintain adequate cardiopulmonary perfusion will improve Outcome: Progressing

## 2020-07-09 NOTE — Progress Notes (Signed)
Inpatient Diabetes Program Recommendations  AACE/ADA: New Consensus Statement on Inpatient Glycemic Control   Target Ranges:  Prepandial:   less than 140 mg/dL      Peak postprandial:   less than 180 mg/dL (1-2 hours)      Critically ill patients:  140 - 180 mg/dL  Results for ELIGHA, KMETZ (MRN [id number]) as of 07/09/2020 10:35  Ref. Range 07/08/2020 08:07 07/08/2020 11:55 07/08/2020 16:59 07/08/2020 21:35 07/09/2020 07:42  Glucose-Capillary Latest Ref Range: 70 - 99 mg/dL 119 (H) 186 (H) 229 (H) 269 (H) 194 (H)    Review of Glycemic Control  Diabetes history:DM2 Outpatient Diabetes medications:Jardiance 10 mg QAM, Glipizide 10 mg BID Current orders for Inpatient glycemic control:Levemir 15 units daily,Novolog 0-20units TID, Novolog 0-5 units QHS, Novolog 15 units TID with meals;Prednisone 40 mg QAM   Inpatient Diabetes Program Recommendations:    Insulin: If steroids continued as ordered, please consider increasing meal coverage to Novolog 18 units TID with meals.  Thanks, Barnie Alderman, RN, MSN, CDE Diabetes Coordinator Inpatient Diabetes Program [phone] (Team Pager from 8am to 5pm)

## 2020-07-09 NOTE — Care Management Important Message (Signed)
Important Message  Patient Details  Name: Jack Mejia MRN: [id number] Date of Birth: 01-Mar-1946   Medicare Important Message Given:  Yes     Tommy Medal 07/09/2020, 4:08 PM

## 2020-07-10 ENCOUNTER — Inpatient Hospital Stay
Admission: RE | Admit: 2020-07-10 | Discharge: 2020-07-15 | Disposition: A | Discharge status: Home / Self Care | Payer: Medicare HMO | Source: Ambulatory Visit | Attending: Internal Medicine | Admitting: Internal Medicine

## 2020-07-10 DIAGNOSIS — I5033 Acute on chronic diastolic (congestive) heart failure: Secondary | ICD-10-CM | POA: Diagnosis not present

## 2020-07-10 DIAGNOSIS — R69 Illness, unspecified: Secondary | ICD-10-CM | POA: Diagnosis not present

## 2020-07-10 DIAGNOSIS — N1832 Chronic kidney disease, stage 3b: Secondary | ICD-10-CM | POA: Diagnosis not present

## 2020-07-10 DIAGNOSIS — N179 Acute kidney failure, unspecified: Secondary | ICD-10-CM | POA: Diagnosis not present

## 2020-07-10 DIAGNOSIS — D508 Other iron deficiency anemias: Secondary | ICD-10-CM | POA: Diagnosis not present

## 2020-07-10 DIAGNOSIS — A419 Sepsis, unspecified organism: Secondary | ICD-10-CM | POA: Diagnosis not present

## 2020-07-10 DIAGNOSIS — K921 Melena: Secondary | ICD-10-CM | POA: Diagnosis not present

## 2020-07-10 DIAGNOSIS — J189 Pneumonia, unspecified organism: Secondary | ICD-10-CM | POA: Diagnosis not present

## 2020-07-10 DIAGNOSIS — Z515 Encounter for palliative care: Secondary | ICD-10-CM | POA: Diagnosis not present

## 2020-07-10 DIAGNOSIS — I129 Hypertensive chronic kidney disease with stage 1 through stage 4 chronic kidney disease, or unspecified chronic kidney disease: Secondary | ICD-10-CM | POA: Diagnosis not present

## 2020-07-10 DIAGNOSIS — E1169 Type 2 diabetes mellitus with other specified complication: Secondary | ICD-10-CM | POA: Diagnosis not present

## 2020-07-10 DIAGNOSIS — I480 Paroxysmal atrial fibrillation: Secondary | ICD-10-CM | POA: Diagnosis not present

## 2020-07-10 DIAGNOSIS — I2510 Atherosclerotic heart disease of native coronary artery without angina pectoris: Secondary | ICD-10-CM | POA: Diagnosis not present

## 2020-07-10 DIAGNOSIS — R6520 Severe sepsis without septic shock: Secondary | ICD-10-CM | POA: Diagnosis not present

## 2020-07-10 DIAGNOSIS — I130 Hypertensive heart and chronic kidney disease with heart failure and stage 1 through stage 4 chronic kidney disease, or unspecified chronic kidney disease: Secondary | ICD-10-CM | POA: Diagnosis not present

## 2020-07-10 DIAGNOSIS — I5022 Chronic systolic (congestive) heart failure: Secondary | ICD-10-CM | POA: Diagnosis not present

## 2020-07-10 DIAGNOSIS — I10 Essential (primary) hypertension: Secondary | ICD-10-CM | POA: Diagnosis not present

## 2020-07-10 DIAGNOSIS — J9601 Acute respiratory failure with hypoxia: Secondary | ICD-10-CM | POA: Diagnosis not present

## 2020-07-10 DIAGNOSIS — E876 Hypokalemia: Secondary | ICD-10-CM | POA: Diagnosis not present

## 2020-07-10 DIAGNOSIS — D539 Nutritional anemia, unspecified: Secondary | ICD-10-CM | POA: Diagnosis not present

## 2020-07-10 DIAGNOSIS — E785 Hyperlipidemia, unspecified: Secondary | ICD-10-CM | POA: Diagnosis not present

## 2020-07-10 DIAGNOSIS — D62 Acute posthemorrhagic anemia: Secondary | ICD-10-CM | POA: Diagnosis not present

## 2020-07-10 DIAGNOSIS — E1122 Type 2 diabetes mellitus with diabetic chronic kidney disease: Secondary | ICD-10-CM | POA: Diagnosis not present

## 2020-07-10 LAB — GLUCOSE, CAPILLARY
Glucose-Capillary: 172 mg/dL — ABNORMAL HIGH (ref 70–99)
Glucose-Capillary: 246 mg/dL — ABNORMAL HIGH (ref 70–99)

## 2020-07-10 LAB — BASIC METABOLIC PANEL
Anion gap: 11 (ref 5–15)
BUN: 43 mg/dL — ABNORMAL HIGH (ref 8–23)
CO2: 30 mmol/L (ref 22–32)
Calcium: 8.5 mg/dL — ABNORMAL LOW (ref 8.9–10.3)
Chloride: 92 mmol/L — ABNORMAL LOW (ref 98–111)
Creatinine, Ser: 1.65 mg/dL — ABNORMAL HIGH (ref 0.61–1.24)
GFR, Estimated: 43 mL/min — ABNORMAL LOW (ref >60)
Glucose, Bld: 195 mg/dL — ABNORMAL HIGH (ref 70–99)
Potassium: 4 mmol/L (ref 3.5–5.1)
Sodium: 133 mmol/L — ABNORMAL LOW (ref 135–145)

## 2020-07-10 LAB — RESP PANEL BY RT-PCR (FLU A&B, COVID) ARPGX2
Influenza A by PCR: NEGATIVE
Influenza B by PCR: NEGATIVE
SARS Coronavirus 2 by RT PCR: NEGATIVE

## 2020-07-10 MED ORDER — INSULIN DETEMIR 100 UNIT/ML ~~LOC~~ SOLN: 15.0000 [IU] | Freq: Every day | SUBCUTANEOUS | 11 refills | Status: DC

## 2020-07-10 MED ORDER — TORSEMIDE 20 MG PO TABS: 20.0000 mg | ORAL_TABLET | Freq: Two times a day (BID) | ORAL | 11 refills | Status: DC

## 2020-07-10 MED ORDER — INSULIN ASPART 100 UNIT/ML ~~LOC~~ SOLN: 15.0000 [IU] | Freq: Three times a day (TID) | SUBCUTANEOUS | 0 refills | Status: DC

## 2020-07-10 MED ORDER — PREDNISONE 10 MG PO TABS: 30.0000 mg | ORAL_TABLET | Freq: Every day | ORAL | Status: DC

## 2020-07-10 MED ORDER — GLUCERNA SHAKE PO LIQD: 237.0000 mL | Freq: Three times a day (TID) | ORAL | 0 refills | Status: DC

## 2020-07-10 NOTE — Progress Notes (Signed)
[facility] to [facility]. Repeat COVID test negative, called report to Denton Ar, receiving nurse at [facility] center. States they are currently getting a new admit and she will call when staff are available to meet [place] to pick up patient at discharge. Pt in stable condition awaiting discharge. Family notified of discharge plan/disposition per CSW.

## 2020-07-10 NOTE — TOC Transition Note (Signed)
Transition of Care [facility]) - CM/SW Discharge Note   Patient Details  Name: Jack Mejia MRN: [id number] Date of Birth: 03-27-1946  [facility] CM/SW Contact:  Shade Flood, LCSW Phone Number: 07/10/2020, 12:45 PM   Clinical Narrative:     Pt stable for dc today per MD. Damaris Schooner with Marianna Fuss at [facility] to update. They did received insurance auth. They can take pt today pending repeat negative covid test. Updated pt's sig other, [name], and she verbalizes continued agreement with the dc plan. Rn to call report. DC clinical sent electronically. There are no other TOC needs for dc.  Final next level of care: [place] Barriers to Discharge: Barriers Resolved   Patient Goals and CMS Choice Patient states their goals for this hospitalization and ongoing recovery are:: Return home   Choice offered to / list presented to : NA  Discharge Placement PASRR number recieved: 07/04/20            Patient chooses bed at: [facility] Patient to be transferred to facility by: w/c Name of family member notified: Webb Silversmith Patient and family notified of of transfer: 07/10/20  Discharge Plan and Services In-house Referral: Clinical Social Work Discharge Planning Services: CM Consult            DME Arranged: N/A DME Agency: NA                  Social Determinants of Health ([place]) Interventions     Readmission Risk Interventions Readmission Risk Prevention Plan 07/10/2020  Transportation Screening Complete  Social Work Consult for [place] Planning/Counseling Complete  Medication Review Press photographer) Complete  Some recent data might be hidden

## 2020-07-10 NOTE — Discharge Summary (Signed)
Physician Discharge Summary  Jack Mejia:[id number] DOB: 1945/12/29 DOA: 06/24/2020  PCP: Erven Colla, DO  Admit date: 06/24/2020 Discharge date: 07/10/2020  Admitted From: Home Disposition:  SNF  Recommendations for Outpatient Follow-up:  1. Follow up with PCP in 1-2 weeks 2. Please obtain BMP/CBC in one week 3. Follow up Dr. Delton Coombes 07/17/20 at [facility] at 9:30AM    Discharge Condition: Stable CODE STATUS: DNR Diet recommendation: Heart Healthy / Carb Modified   Brief/Interim Summary: 74 y.o.year old malewith a history of paroxysmal atrial fibrillation on apixaban, CKD stage III, hypertension, diastolic CHF, hyperlipidemia, diabetes mellitus type 2,macrocytic anemia following with hematology s/p iron injection and receiving Retacrit, and adenomatous colon polyps in 2011who was admitted 06/24/20 with sepsis and acuterespiratory failurein the setting of pneumonia and pulmonary edema withacute on chronic diastolic CHF.  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 Melvyn Novas and Dr Halford Chessman -Stopped amiodarone -Treated with IV Solu-Medrol started 06/28/2020 through 07/02/2020,  -Transitioned to prednisone 40 mg daily on 12/8 with plans to decrease by 10 mg every 7 days -Oxygen has been weaned down to less than 2 L/min from 15 L last week No longer requiring BiPAP -Wean oxygen as tolerated for saturation greater 92% BC x 2 11/29 >>> RESP viral Panel PCR 11/29 neg MRSA PCR 11/30 Neg Strep  urinary ag 11/30 neg  Legionella urinary ag 11/30 >>>neg -Overall significant improvement since starting steroids on 06/28/2020 -Repeat chest x-ray on 07/02/2020 shows improvement -Follow-up appointment with pulmonology has been scheduled   Acute on chronic diastolic CHF -Volume overload status-worsened with transfusion of PRBC -Continue Bumex twice daily>>transition back to home torsemide, but increase to bid -Continue on fluid restriction -Plan will be to discharge on p.o. torsemide as patient has tolerated this in the past -Daily weights -Accurate I's and O's -05/08/2020 echo EF 65 to 70%, no WMA, grade 1 DD, trivial MR, moderate AAS --BNP is elevated -Currently, volume status improving  DM2--uncontrolled with hyperglycemia - last A1c 8.3 reflecting uncontrolled DM with hyperglycemia -NovoLog sliding scale -Holding glipizide--restart after d/c -Blood sugars uncontrolled with hyperglycemia, likely related to steroids -Adjusting Levemir and NovoLog with meals -may need to decrease/stop levemir and novolog as steroids are weaned  CAP/Lobar pneumonia -COVID-19 PCR negative -Patient completed ceftriaxone and doxycycline -Continue bronchodilators and mucolytics -Repeat chest x-ray from 07/02/2020 noted  Severe sepsis -Present on admission -Presented with leukocytosis and tachypnea -Secondary to pneumonia as above -Lactic acid peaked 3.4 -Completed antibiotics as above -Sepsis pathophysiology appears to have resolved  Acute on chronic renal failure CKD stage IIIb -Baseline creatinine 2.0-2.2 -Presented with serum creatinine 2.51 -Monitor closely with diuresis ----renally adjust medications, avoid nephrotoxic agents / dehydration  / hypotension  Hematochezia/ABLA -GI consulted -baseline Hgb 9 -presented with Hgb 7.5 -Received a total of 2 units of PRBC this admission -Follow-up hemoglobin improved to 9 and has been fluctuating between 8 and 9 -GI consult  appreciated -Patient's cardiopulmonary  status precludes endoluminal evaluation -Advance to GI soft diet  Paroxysmal Atrial Fibrillation -holding apixaban due to hematochezia -Discussed with GI, Dr. Jenetta Downer who agreed that it would be reasonable to hold anticoagulation until he can  be seen in follow-up by GI and further work-up including endoluminal evaluation can be performed -Close follow-up with GI will be scheduled -Continue Cardizem CD -Stopped amiodarone due to concerns about possible amiodarone induced pneumonitis -start ASA 81 mg in lieu of apixaban  Essential Hypertension--stable -Continue diltiazem CD -Continue metoprolol heart rate  Hyperlipidemia -continue statin  Hypokalemia/Hypomagnesemia--- replace and recheck, electrolytes abnormalities may persist due to high-dose diuretic use  Leukocytosis -Chart reviewed and patient has seen hem/onc in the past for macrocytic anemia -He had a bone marrow biopsy done in 03/2020 that showed hypercellular marrow with trilineage dysplasia -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 Chryl Heck and Dr. Burr Medico, oncology who felt it was reasonable to discharge the patient in follow-up further work-up as an outpatient -He will be arranged to be seen in the oncology clinic--07/17/20 with Dr. Delton Coombes at 9:30 AM  Social/ethics--plan of care, advanced directives and goals of care discussed with patient and patient's 2 daughters [name] and [name] -Palliative care consult appreciated patient is a DNR/DNI with full scope of treatment  Discharge Instructions   Allergies as of 07/10/2020      Reactions   Demerol Nausea And Vomiting   Vasotec [enalapril] Cough   Lasix [furosemide] Rash      Medication List    STOP taking these medications    allopurinol 100 MG tablet Commonly known as: ZYLOPRIM   amiodarone 200 MG tablet Commonly known as: PACERONE   apixaban 5 MG Tabs tablet Commonly known as: Eliquis   ibuprofen 200 MG tablet Commonly known as: ADVIL   traMADol 50 MG tablet Commonly known as: Ultram     TAKE these medications   acetaminophen 650 MG CR tablet Commonly known as: TYLENOL Take 650 mg by mouth every 8 (eight) hours as needed for pain.   atorvastatin 20 MG tablet Commonly known as: LIPITOR Take 1 tablet (20 mg total) by mouth daily.   diltiazem 360 MG 24 hr capsule Commonly known as: CARDIZEM CD Take 1 capsule (360 mg total) by mouth daily.   empagliflozin 10 MG Tabs tablet Commonly known as: Jardiance Take 1 tablet (10 mg total) by mouth daily before breakfast.   feeding supplement (GLUCERNA SHAKE) Liqd Take 237 mLs by mouth 3 (three) times daily.   glipiZIDE 5 MG tablet Commonly known as: GLUCOTROL Take 2 tablets (10 mg total) by mouth 2 (two) times daily.   insulin aspart 100 UNIT/ML injection Commonly known as: novoLOG Inject 15 Units into the skin 3 (three) times daily with meals.   insulin detemir 100 UNIT/ML injection Commonly known as: LEVEMIR Inject 0.15 mLs (15 Units total) into the skin daily. Start taking on: July 11, 2020   metoprolol tartrate 25 MG tablet Commonly known as: LOPRESSOR Take 1 tablet (25 mg total) by mouth 2 (two) times daily.   predniSONE 10 MG tablet Commonly known as: DELTASONE Take 3 tablets (30 mg total) by mouth daily with breakfast. And decrease by 10 mg every 7 days (next on 12/22) Start taking on: July 11, 2020   torsemide 20 MG tablet Commonly known as: DEMADEX Take 1 tablet (20 mg total) by mouth 2 (two) times daily. What changed: when to take this   Vitamin D (Ergocalciferol) 1.25 MG (50000 UNIT) Caps capsule Commonly known as: DRISDOL Take 1 capsule (50,000 Units total) by  mouth every 7 (seven) days.       Contact  information for follow-up providers    Derek Jack, MD On 07/17/2020.   Specialty: Hematology Why: at 9:30 am Contact information: [address] [place] [address] [phone]            Contact information for after-discharge care    [facility] .   Service: Skilled Nursing Contact information: [address] [address] [phone]                 Allergies  Allergen Reactions  . Demerol Nausea And Vomiting  . Vasotec [Enalapril] Cough  . Lasix [Furosemide] Rash    Consultations:  GI  palliative   Procedures/Studies: US RENAL  Result Date: 06/17/2020 CLINICAL DATA:  Chronic renal disease stage IV EXAM: RENAL / URINARY TRACT ULTRASOUND COMPLETE COMPARISON:  April 21, 2019 FINDINGS: Right Kidney: Renal measurements: 10.7 cm x 5.2 cm x 5.5 cm = volume: 162.9 mL. Echogenicity within normal limits. A 3.1 cm x 3.3 cm x 2.7 cm complex anechoic structure is seen within the mid right kidney. No hydronephrosis is visualized. Left Kidney: Renal measurements: 12.3 cm x 7.2 cm x 6.1 cm = volume: 283.3 mL. Echogenicity within normal limits. 2.1 cm x 2.1 cm x 2.3 cm and 3.0 cm x 2.1 cm x 2.4 cm anechoic structures are seen within the mid and lower left kidney. An 8.5 mm shadowing echogenic focus is also seen within the lower pole of the left kidney. No hydronephrosis is visualized. Bladder: Appears normal for degree of bladder distention. Bilateral ureteral jets are visualized. Other: None. IMPRESSION: 1. Bilateral renal cysts, mildly increased in size when compared to the prior study. 2. Subcentimeter non-obstructing renal stone within the lower pole of the left kidney. Electronically Signed   By: Virgina Norfolk M.D.   On: 06/17/2020 23:57   US Venous Img Upper Uni Right(DVT)  Result Date: 07/04/2020 CLINICAL DATA:  Right upper extremity edema. History of diabetes. Former smoker. Evaluate  for DVT. EXAM: RIGHT UPPER EXTREMITY VENOUS DOPPLER ULTRASOUND TECHNIQUE: Gray-scale sonography with graded compression, as well as color Doppler and duplex ultrasound were performed to evaluate the upper extremity deep venous system from the level of the subclavian vein and including the jugular, axillary, basilic, radial, ulnar and upper cephalic vein. Spectral Doppler was utilized to evaluate flow at rest and with distal augmentation maneuvers. COMPARISON:  None. FINDINGS: Contralateral Subclavian Vein: Respiratory phasicity is normal and symmetric with the symptomatic side. No evidence of thrombus. Normal compressibility. Internal Jugular Vein: No evidence of thrombus. Normal compressibility, respiratory phasicity and response to augmentation. Subclavian Vein: No evidence of thrombus. Normal compressibility, respiratory phasicity and response to augmentation. Axillary Vein: No evidence of thrombus. Normal compressibility, respiratory phasicity and response to augmentation. Cephalic Vein: No evidence of thrombus. Normal compressibility, respiratory phasicity and response to augmentation. Basilic Vein: Not well visualized. Brachial Veins: No evidence of thrombus. Normal compressibility, respiratory phasicity and response to augmentation. Radial Veins: No evidence of thrombus. Normal compressibility, respiratory phasicity and response to augmentation. Ulnar Veins: No evidence of thrombus. Normal compressibility, respiratory phasicity and response to augmentation. Venous Reflux:  None visualized. Other Findings: There is a large amount of subcutaneous edema at the level of the right forearm (images 34 through 38). IMPRESSION: 1. No evidence of DVT within the right upper extremity. 2. Large amount of subcutaneous edema at the level of the right forearm  Electronically Signed   By: Sandi Mariscal M.D.   On: 07/04/2020 15:16   DG Chest Port 1 View  Result Date: 07/02/2020 CLINICAL DATA:  Pneumonitis. EXAM: PORTABLE  CHEST 1 VIEW COMPARISON:  06/30/2020. FINDINGS: Cardiomegaly. Diffuse bilateral interstitial infiltrates again noted. Slight improvement from prior exam. No pleural effusion or pneumothorax. Thoracic spine scoliosis and degenerative change. IMPRESSION: 1. Cardiomegaly. 2. Diffuse bilateral interstitial infiltrates again noted. Slight improvement from prior exam. Electronically Signed   By: Marcello Moores  Register   On: 07/02/2020 07:30   DG CHEST PORT 1 VIEW  Result Date: 06/30/2020 CLINICAL DATA:  Sepsis, acute respiratory failure, pneumonia and pulmonary edema, acute on chronic CHF. EXAM: PORTABLE CHEST 1 VIEW COMPARISON:  Chest x-ray dated 06/27/2020 FINDINGS: Persistent bilateral airspace opacities, but slightly improved aeration of both lungs. No pleural effusion or pneumothorax is seen. Heart size and mediastinal contours are grossly stable. IMPRESSION: Slightly improved aeration of both lungs, but with persistent bilateral airspace opacities, compatible with multifocal pneumonia versus pulmonary edema. Electronically Signed   By: Franki Cabot M.D.   On: 06/30/2020 05:49   DG CHEST PORT 1 VIEW  Result Date: 06/27/2020 CLINICAL DATA:  Shortness of breath. EXAM: PORTABLE CHEST 1 VIEW COMPARISON:  06/24/2020.  CT abdomen 06/19/2014. FINDINGS: Stable cardiomegaly. Progressive diffuse severe bilateral pulmonary infiltrates/edema. No prominent pleural effusion. No pneumothorax. Stable calcific densities noted over the right upper quadrant, most likely dystrophic calcification. Degenerative change thoracic spine and both shoulders. IMPRESSION: 1. Stable cardiomegaly. 2. Progressive diffuse severe bilateral pulmonary infiltrates/edema. Electronically Signed   By: Marcello Moores  Register   On: 06/27/2020 05:56   DG Chest Port 1 View  Result Date: 06/24/2020 CLINICAL DATA:  Shortness of breath. EXAM: PORTABLE CHEST 1 VIEW COMPARISON:  Dec 12, 2019. FINDINGS: Stable cardiomediastinal silhouette. No pneumothorax or  pleural effusion is noted. Bilateral patchy airspace opacities are now noted concerning for multifocal pneumonia. Bony thorax is unremarkable IMPRESSION: Bilateral patchy airspace opacities are noted concerning for multifocal pneumonia. Electronically Signed   By: Marijo Conception M.D.   On: 06/24/2020 11:37   US Abdomen Limited RUQ (LIVER/GB)  Result Date: 06/25/2020 CLINICAL DATA:  Elevated LFTs, post cholecystectomy EXAM: ULTRASOUND ABDOMEN LIMITED RIGHT UPPER QUADRANT COMPARISON:  September of 2018 CT evaluation FINDINGS: Gallbladder: Post cholecystectomy. Common bile duct: Diameter: 4.2 mm Liver: Mildly increased echogenicity of hepatic parenchyma without visible lesion on limited images. Portal vein is patent on color Doppler imaging with normal direction of blood flow towards the liver. Other: RIGHT pleural effusion, may be moderate. IMPRESSION: 1. Hepatic steatosis, mild. 2. No biliary duct dilation post cholecystectomy. 3. RIGHT-sided pleural effusion partially imaged. Electronically Signed   By: Zetta Bills M.D.   On: 06/25/2020 15:07        Discharge Exam: Vitals:   07/10/20 0448 07/10/20 1050  BP: 139/64 (!) 130/58  Pulse: (!) 58 (!) 56  Resp: 18 18  Temp: 98.2 F (36.8 C) 98.1 F (36.7 C)  SpO2: 99% 100%   Vitals:   07/09/20 2215 07/10/20 0448 07/10/20 0500 07/10/20 1050  BP: 135/66 139/64  (!) 130/58  Pulse: 62 (!) 58  (!) 56  Resp: $Remo'18 18  18  'MZbSp$ Temp: 98.1 F (36.7 C) 98.2 F (36.8 C)  98.1 F (36.7 C)  TempSrc: Oral Oral  Oral  SpO2: 100% 99%  100%  Weight:   87.8 kg   Height:        General: Pt is alert, awake, not in acute distress Cardiovascular: RRR,  S1/S2 +, no rubs, no gallops Respiratory: bilateral scattered rales Abdominal: Soft, NT, ND, bowel sounds + Extremities: 1+LE edema, no cyanosis   The results of significant diagnostics from this hospitalization (including imaging, microbiology, ancillary and laboratory) are listed below for reference.     Significant Diagnostic Studies: US RENAL  Result Date: 06/17/2020 CLINICAL DATA:  Chronic renal disease stage IV EXAM: RENAL / URINARY TRACT ULTRASOUND COMPLETE COMPARISON:  April 21, 2019 FINDINGS: Right Kidney: Renal measurements: 10.7 cm x 5.2 cm x 5.5 cm = volume: 162.9 mL. Echogenicity within normal limits. A 3.1 cm x 3.3 cm x 2.7 cm complex anechoic structure is seen within the mid right kidney. No hydronephrosis is visualized. Left Kidney: Renal measurements: 12.3 cm x 7.2 cm x 6.1 cm = volume: 283.3 mL. Echogenicity within normal limits. 2.1 cm x 2.1 cm x 2.3 cm and 3.0 cm x 2.1 cm x 2.4 cm anechoic structures are seen within the mid and lower left kidney. An 8.5 mm shadowing echogenic focus is also seen within the lower pole of the left kidney. No hydronephrosis is visualized. Bladder: Appears normal for degree of bladder distention. Bilateral ureteral jets are visualized. Other: None. IMPRESSION: 1. Bilateral renal cysts, mildly increased in size when compared to the prior study. 2. Subcentimeter non-obstructing renal stone within the lower pole of the left kidney. Electronically Signed   By: Virgina Norfolk M.D.   On: 06/17/2020 23:57   US Venous Img Upper Uni Right(DVT)  Result Date: 07/04/2020 CLINICAL DATA:  Right upper extremity edema. History of diabetes. Former smoker. Evaluate for DVT. EXAM: RIGHT UPPER EXTREMITY VENOUS DOPPLER ULTRASOUND TECHNIQUE: Gray-scale sonography with graded compression, as well as color Doppler and duplex ultrasound were performed to evaluate the upper extremity deep venous system from the level of the subclavian vein and including the jugular, axillary, basilic, radial, ulnar and upper cephalic vein. Spectral Doppler was utilized to evaluate flow at rest and with distal augmentation maneuvers. COMPARISON:  None. FINDINGS: Contralateral Subclavian Vein: Respiratory phasicity is normal and symmetric with the symptomatic side. No evidence of thrombus.  Normal compressibility. Internal Jugular Vein: No evidence of thrombus. Normal compressibility, respiratory phasicity and response to augmentation. Subclavian Vein: No evidence of thrombus. Normal compressibility, respiratory phasicity and response to augmentation. Axillary Vein: No evidence of thrombus. Normal compressibility, respiratory phasicity and response to augmentation. Cephalic Vein: No evidence of thrombus. Normal compressibility, respiratory phasicity and response to augmentation. Basilic Vein: Not well visualized. Brachial Veins: No evidence of thrombus. Normal compressibility, respiratory phasicity and response to augmentation. Radial Veins: No evidence of thrombus. Normal compressibility, respiratory phasicity and response to augmentation. Ulnar Veins: No evidence of thrombus. Normal compressibility, respiratory phasicity and response to augmentation. Venous Reflux:  None visualized. Other Findings: There is a large amount of subcutaneous edema at the level of the right forearm (images 34 through 38). IMPRESSION: 1. No evidence of DVT within the right upper extremity. 2. Large amount of subcutaneous edema at the level of the right forearm Electronically Signed   By: Sandi Mariscal M.D.   On: 07/04/2020 15:16   DG Chest Port 1 View  Result Date: 07/02/2020 CLINICAL DATA:  Pneumonitis. EXAM: PORTABLE CHEST 1 VIEW COMPARISON:  06/30/2020. FINDINGS: Cardiomegaly. Diffuse bilateral interstitial infiltrates again noted. Slight improvement from prior exam. No pleural effusion or pneumothorax. Thoracic spine scoliosis and degenerative change. IMPRESSION: 1. Cardiomegaly. 2. Diffuse bilateral interstitial infiltrates again noted. Slight improvement from prior exam. Electronically Signed   By: Marcello Moores  Register  On: 07/02/2020 07:30   DG CHEST PORT 1 VIEW  Result Date: 06/30/2020 CLINICAL DATA:  Sepsis, acute respiratory failure, pneumonia and pulmonary edema, acute on chronic CHF. EXAM: PORTABLE CHEST 1  VIEW COMPARISON:  Chest x-ray dated 06/27/2020 FINDINGS: Persistent bilateral airspace opacities, but slightly improved aeration of both lungs. No pleural effusion or pneumothorax is seen. Heart size and mediastinal contours are grossly stable. IMPRESSION: Slightly improved aeration of both lungs, but with persistent bilateral airspace opacities, compatible with multifocal pneumonia versus pulmonary edema. Electronically Signed   By: Franki Cabot M.D.   On: 06/30/2020 05:49   DG CHEST PORT 1 VIEW  Result Date: 06/27/2020 CLINICAL DATA:  Shortness of breath. EXAM: PORTABLE CHEST 1 VIEW COMPARISON:  06/24/2020.  CT abdomen 06/19/2014. FINDINGS: Stable cardiomegaly. Progressive diffuse severe bilateral pulmonary infiltrates/edema. No prominent pleural effusion. No pneumothorax. Stable calcific densities noted over the right upper quadrant, most likely dystrophic calcification. Degenerative change thoracic spine and both shoulders. IMPRESSION: 1. Stable cardiomegaly. 2. Progressive diffuse severe bilateral pulmonary infiltrates/edema. Electronically Signed   By: Marcello Moores  Register   On: 06/27/2020 05:56   DG Chest Port 1 View  Result Date: 06/24/2020 CLINICAL DATA:  Shortness of breath. EXAM: PORTABLE CHEST 1 VIEW COMPARISON:  Dec 12, 2019. FINDINGS: Stable cardiomediastinal silhouette. No pneumothorax or pleural effusion is noted. Bilateral patchy airspace opacities are now noted concerning for multifocal pneumonia. Bony thorax is unremarkable IMPRESSION: Bilateral patchy airspace opacities are noted concerning for multifocal pneumonia. Electronically Signed   By: Marijo Conception M.D.   On: 06/24/2020 11:37   US Abdomen Limited RUQ (LIVER/GB)  Result Date: 06/25/2020 CLINICAL DATA:  Elevated LFTs, post cholecystectomy EXAM: ULTRASOUND ABDOMEN LIMITED RIGHT UPPER QUADRANT COMPARISON:  September of 2018 CT evaluation FINDINGS: Gallbladder: Post cholecystectomy. Common bile duct: Diameter: 4.2 mm Liver:  Mildly increased echogenicity of hepatic parenchyma without visible lesion on limited images. Portal vein is patent on color Doppler imaging with normal direction of blood flow towards the liver. Other: RIGHT pleural effusion, may be moderate. IMPRESSION: 1. Hepatic steatosis, mild. 2. No biliary duct dilation post cholecystectomy. 3. RIGHT-sided pleural effusion partially imaged. Electronically Signed   By: Zetta Bills M.D.   On: 06/25/2020 15:07     Microbiology: No results found for this or any previous visit (from the past 240 hour(s)).   Labs: Basic Metabolic Panel: Recent Labs  Lab 07/06/20 0521 07/07/20 0756 07/08/20 0730 07/09/20 0650 07/10/20 0454  NA 134* 128* 136 132* 133*  K 3.4* 3.9 4.3 4.0 4.0  CL 98 92* 95* 91* 92*  CO2 28 30 32 32 30  GLUCOSE 94 106* 131* 231* 195*  BUN 43* 18 39* 44* 43*  CREATININE 1.34* 0.91 1.52* 1.65* 1.65*  CALCIUM 8.2* 8.3* 8.5* 8.4* 8.5*   Liver Function Tests: Recent Labs  Lab 07/08/20 0730  AST 52*  ALT 88*  ALKPHOS 204*  BILITOT 1.0  PROT 5.9*  ALBUMIN 2.6*   No results for input(s): LIPASE, AMYLASE in the last 168 hours. No results for input(s): AMMONIA in the last 168 hours. CBC: Recent Labs  Lab 07/04/20 0433 07/08/20 0730 07/08/20 1559 07/09/20 0650  WBC 27.5* 39.1* 44.5* 37.4*  NEUTROABS  --   --  31.6* 26.6*  HGB 8.6* 9.3* 9.3* 8.7*  HCT 27.5* 29.5* 29.3* 27.6*  MCV 102.6* 105.4* 104.3* 104.2*  PLT 174 144* 144* 135*   Cardiac Enzymes: No results for input(s): CKTOTAL, CKMB, CKMBINDEX, TROPONINI in the last 168 hours. BNP: Invalid  input(s): POCBNP CBG: Recent Labs  Lab 07/09/20 0742 07/09/20 1125 07/09/20 1623 07/09/20 2212 07/10/20 0824  GLUCAP 194* 309* 194* 134* 172*    Time coordinating discharge:  36 minutes  Signed:  Orson Eva, DO [facility] Pager: [phone] 07/10/2020, 11:58 AM

## 2020-07-11 ENCOUNTER — Non-Acute Institutional Stay (SKILLED_NURSING_FACILITY): Payer: Medicare HMO | Admitting: Adult Health

## 2020-07-11 ENCOUNTER — Encounter: Payer: Self-pay | Admitting: Adult Health

## 2020-07-11 DIAGNOSIS — A419 Sepsis, unspecified organism: Secondary | ICD-10-CM

## 2020-07-11 DIAGNOSIS — D539 Nutritional anemia, unspecified: Secondary | ICD-10-CM

## 2020-07-11 DIAGNOSIS — M48062 Spinal stenosis, lumbar region with neurogenic claudication: Secondary | ICD-10-CM

## 2020-07-11 DIAGNOSIS — J9601 Acute respiratory failure with hypoxia: Secondary | ICD-10-CM | POA: Diagnosis not present

## 2020-07-11 DIAGNOSIS — R6520 Severe sepsis without septic shock: Secondary | ICD-10-CM | POA: Insufficient documentation

## 2020-07-11 DIAGNOSIS — N1832 Chronic kidney disease, stage 3b: Secondary | ICD-10-CM

## 2020-07-11 DIAGNOSIS — D508 Other iron deficiency anemias: Secondary | ICD-10-CM

## 2020-07-11 DIAGNOSIS — J188 Other pneumonia, unspecified organism: Secondary | ICD-10-CM

## 2020-07-11 DIAGNOSIS — E1122 Type 2 diabetes mellitus with diabetic chronic kidney disease: Secondary | ICD-10-CM | POA: Diagnosis not present

## 2020-07-11 DIAGNOSIS — J189 Pneumonia, unspecified organism: Secondary | ICD-10-CM | POA: Diagnosis not present

## 2020-07-11 DIAGNOSIS — N179 Acute kidney failure, unspecified: Secondary | ICD-10-CM

## 2020-07-11 DIAGNOSIS — E1169 Type 2 diabetes mellitus with other specified complication: Secondary | ICD-10-CM

## 2020-07-11 DIAGNOSIS — E119 Type 2 diabetes mellitus without complications: Secondary | ICD-10-CM | POA: Insufficient documentation

## 2020-07-11 DIAGNOSIS — I5033 Acute on chronic diastolic (congestive) heart failure: Secondary | ICD-10-CM | POA: Diagnosis not present

## 2020-07-11 DIAGNOSIS — N183 Chronic kidney disease, stage 3 unspecified: Secondary | ICD-10-CM

## 2020-07-11 DIAGNOSIS — I480 Paroxysmal atrial fibrillation: Secondary | ICD-10-CM

## 2020-07-11 DIAGNOSIS — I129 Hypertensive chronic kidney disease with stage 1 through stage 4 chronic kidney disease, or unspecified chronic kidney disease: Secondary | ICD-10-CM

## 2020-07-11 DIAGNOSIS — D62 Acute posthemorrhagic anemia: Secondary | ICD-10-CM

## 2020-07-11 DIAGNOSIS — K921 Melena: Secondary | ICD-10-CM

## 2020-07-11 DIAGNOSIS — E785 Hyperlipidemia, unspecified: Secondary | ICD-10-CM

## 2020-07-11 NOTE — Progress Notes (Signed)
Location:  [facility] Room Number: 135/P Place of Service:  SNF (31)   CODE STATUS: DNR  Allergies  Allergen Reactions  . Demerol Nausea And Vomiting  . Vasotec [Enalapril] Cough  . Lasix [Furosemide] Rash    Chief Complaint  Patient presents with  . Hospitalization Follow-up    Hospitalization Follow Up     HPI:  He is a 74 year old man who has been hospitalized from 06-24-20 through 07-10-20. 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 His 02 has been weaned from 15 liters to 2 liters; is off BIPAP. He has completed rocephin and doxycycline.He is on a steroid taper. He was treated for acute on chronic stage 3 renal failure ; acute on chronic heart failure.he has had intermittent rectal bleeding with acte blood loss anemia. He did receive blood transfusions. He has a history of macrocytic anemia he has received iron injection and is on retacrit. His eliquis due to his rectal bleeding and will remain on hold until seen by GI.   He is here for short term rehab with his goal to return back home. He does have some fatigue and shortness of breath. He denies constipation. He will continue to be followed for his chronic illnesses including:  Macrocytic anemia/ other iron deficiency anemia/acute blood loss anemia/hematochezia   Past Medical History:  Diagnosis Date  . Arthritis   . Asthma   . Atrial fibrillation ([place])    a. s/p DCCV in 03/2019  . CAD (coronary artery disease) 06/29/2019  . Cramps of left lower extremity   . Depression   . Diabetes mellitus    type 2 for 7-8 yrs  . Dyspnea   . Elevated liver enzymes   . Fatty liver   . History of kidney stones   . Hyperlipidemia   . Hypertension   . Renal insufficiency   . Vertigo     Past Surgical History:  Procedure Laterality Date  . CARDIOVERSION N/A 04/21/2019   Procedure: CARDIOVERSION;  Surgeon: Sanda Klein, MD;   Location: [facility];  Service: Cardiovascular;  Laterality: N/A;  . CARPAL TUNNEL RELEASE     both wrist  . cataract surgery     bilateral  . CHOLECYSTECTOMY    . COLONOSCOPY  2011  . EYE SURGERY    . HEMORROIDECTOMY    . KNEE SURGERY Left   . LEFT HEART CATH AND CORONARY ANGIOGRAPHY N/A 05/23/2019   Procedure: LEFT HEART CATH AND CORONARY ANGIOGRAPHY;  Surgeon: Belva Crome, MD;  Location: [facility];  Service: Cardiovascular;  Laterality: N/A;  . LUMBAR LAMINECTOMY/DECOMPRESSION MICRODISCECTOMY N/A 08/06/2016   Procedure: LUMBAR THREE- LUMBAR FIVE  DECOMPRESSIVE LUMBAR LAMINECTOMY;  Surgeon: Jovita Gamma, MD;  Location: [facility];  Service: Neurosurgery;  Laterality: N/A;    Social History   Socioeconomic History  . Marital status: Divorced    Spouse name: Not on file  . Number of children: 2  . Years of education: Not on file  . Highest education level: Not on file  Occupational History  . Occupation: RETIRED  Tobacco Use  . Smoking status: Former Smoker    Packs/day: 4.00    Years: 20.00    Pack years: 80.00    Types: Cigarettes    Quit date: 10/14/1982    Years since quitting: 37.7  . Smokeless tobacco: Never Used  . Tobacco comment: 29 yrs ago  Vaping Use  . Vaping  Use: Never used  Substance and Sexual Activity  . Alcohol use: No  . Drug use: No  . Sexual activity: Not Currently    Birth control/protection: None  Other Topics Concern  . Not on file  Social History Narrative  . Not on file   Social Determinants of Health   Financial Resource Strain: Not on file  Food Insecurity: Not on file  Transportation Needs: Not on file  Physical Activity: Not on file  Stress: Not on file  Social Connections: Not on file  Intimate Partner Violence: Not on file   Family History  Problem Relation Age of Onset  . Diabetes Mother   . Cancer Mother        unknown kind  . Diabetes Sister   . COPD Sister   . Liver disease Brother   . COPD Brother   .  Diabetes Maternal Grandmother   . Diabetes Brother   . COPD Sister   . Healthy Daughter   . Healthy Daughter   . Colon cancer Neg Hx       VITAL SIGNS BP (!) 144/64   Pulse 87   Temp (!) 97.1 F (36.2 C)   Resp 20   Ht _0  (1.778 m)   Wt 189 lb 8 oz (86 kg)   SpO2 100%   BMI 27.19 kg/m   Outpatient Encounter Medications as of 07/11/2020  Medication Sig  . acetaminophen (TYLENOL) 650 MG CR tablet Take 650 mg by mouth every 8 (eight) hours as needed for pain.  [name] atorvastatin (LIPITOR) 20 MG tablet Take 1 tablet (20 mg total) by mouth daily.  [name] diltiazem (CARDIZEM CD) 360 MG 24 hr capsule Take 1 capsule (360 mg total) by mouth daily.  . empagliflozin (JARDIANCE) 10 MG TABS tablet Take 1 tablet (10 mg total) by mouth daily before breakfast.  . feeding supplement, GLUCERNA SHAKE, (GLUCERNA SHAKE) LIQD Take 237 mLs by mouth 3 (three) times daily.  . insulin aspart (NOVOLOG) 100 UNIT/ML injection Inject 15 Units into the skin 3 (three) times daily with meals.  . insulin detemir (LEVEMIR) 100 UNIT/ML injection Inject 0.15 mLs (15 Units total) into the skin daily.  . metoprolol tartrate (LOPRESSOR) 25 MG tablet Take 1 tablet (25 mg total) by mouth 2 (two) times daily.  . NON FORMULARY Diet: [place], NAS; thin liquids; 1200cc fluid restriction  . OXYGEN Inhale 2 L into the lungs continuous.  . predniSONE (DELTASONE) 10 MG tablet Take 30 mg by mouth once daily from 07/11/2020-07/17/2020. Then take 20 mg by mouth once daily from 07/18/2020-07/24/2020.Then take 10 mg by mouth once daily from 07/25/2020-07/31/2020  . torsemide (DEMADEX) 20 MG tablet Take 1 tablet (20 mg total) by mouth 2 (two) times daily.  . Vitamin D, Ergocalciferol, (DRISDOL) 1.25 MG (50000 UNIT) CAPS capsule Take 1 capsule (50,000 Units total) by mouth every 7 (seven) days.   No facility-administered encounter medications on file as of 07/11/2020.     SIGNIFICANT DIAGNOSTIC EXAMS  TODAY  06-25-20: abdominal  ultrasound:  1. Hepatic steatosis, mild. 2. No biliary duct dilation post cholecystectomy. 3. RIGHT-sided pleural effusion partially imaged.  06-27-20: chest x-ray:  1. Stable cardiomegaly. 2. Progressive diffuse severe bilateral pulmonary infiltrates/edema.  07-02-20: chest x-ray:  1. Cardiomegaly. 2. Diffuse bilateral interstitial infiltrates again noted. Slight improvement from prior exam.  07-04-20: right upper extremity venous doppler:  1. No evidence of DVT within the right upper extremity. 2. Large amount of subcutaneous edema at the level of the  right forearm  LABS REVIEWED:   06-25-20: wbc 26.3; hgb 7.0; hct 22.8; mcv 121.3 plt 208; glucose 297; bun 45; creat 2.20; k+ 3.7; na++ 138; ca 8.0 iron 52; tibc 213; folate 29.1 vit B 12: 835; ferritin 1250; hgb a1c 8.3 06-27-20: BNP 625.0 sed rate 130 06-28-20: wbc 14.7; hgb 8.9; hct 28.9; mcv 105.1 plt 147; glucose 149; bun 26; creat 1.68; k+ 2.9; na++ 134; ca 7.6 06-30-20: wbc 9.5; hgb 8.2; hct 25.7; mcv 103.6 plt 148 glucose 364; bun 36; creat 1.72; k+ 3.4; na++ 131; ca 7.8 phos 3.5 mag 1.9; albumin 2.1 07-04-20: wbc 27.5; hgb 8.6; hct 27.5; mcv 102.6 plt 174; glucose 257; bun 51; creat 1.74; k+ 3.9; na++ 131; ca 8.2 07-08-20: wbc 39.1; hgb 9.3; hct 29.5; mv 105.4 plt 144; glucose 131; bun 39; creat 1.52; k+ 4.3; na++ 136; ca 8.5 ast 52; alt 88; alk phos 204; albumin 2.6    Review of Systems  Constitutional: Positive for malaise/fatigue.  Respiratory: Positive for shortness of breath. Negative for cough.   Cardiovascular: Negative for chest pain, palpitations and leg swelling.  Gastrointestinal: Negative for abdominal pain, constipation and heartburn.  Musculoskeletal: Negative for back pain, joint pain and myalgias.  Skin: Negative.   Neurological: Negative for dizziness.  Psychiatric/Behavioral: The patient is not nervous/anxious.     Physical Exam Constitutional:      General: He is not in acute distress.    Appearance: He is  well-developed and well-nourished. He is not diaphoretic.  Neck:     Thyroid: No thyromegaly.  Cardiovascular:     Rate and Rhythm: Normal rate. Rhythm irregular.     Pulses: Normal pulses and intact distal pulses.     Heart sounds: Murmur heard.      Comments: 2/6 Pulmonary:     Effort: Pulmonary effort is normal. No respiratory distress.     Breath sounds: Normal breath sounds.     Comments: 02 dependent  Abdominal:     General: Bowel sounds are normal. There is no distension.     Palpations: Abdomen is soft.     Tenderness: There is no abdominal tenderness.  Musculoskeletal:        General: No edema. Normal range of motion.     Cervical back: Neck supple.     Right lower leg: No edema.     Left lower leg: No edema.  Lymphadenopathy:     Cervical: No cervical adenopathy.  Skin:    General: Skin is warm and dry.  Neurological:     Mental Status: He is alert and oriented to person, place, and time.  Psychiatric:        Mood and Affect: Mood and affect and mood normal.       ASSESSMENT/ PLAN:  TODAY  1. Multifocal pneumonia/acute respiratory failure with hypoxia/severe sepsis: due to amiodarone induced pneumonitis: has completed abt; is on 02 at 2 Liter; is on prednisone taper. Will continue to monitor his status.   2. Macrocytic anemia/ other iron deficiency anemia/acute blood loss anemia/hematochezia: has received RBC transfusion; has received iron injection ans retacrit. Hgb is 9.3 will monitor his status. Will hold eliquis until seen by GI  3. Paroxysmal atrial fibrillation: heart rate is stable amiodarone has been stopped; holding eliquis until seen by GI due to rectal bleed. Will continue lopressor 25 mg twice daily ad cardizem cd 360 mg daily for rate control  4. Type 2 diabetes mellitus with stage 3b chronic kidney disease and hypertension: is  stable hgb a1c 8.3 will continue novolog 15 units with meals and levemir 15 units nightly; jardiance 10 mg daily glipizide  was stopped due to increased risk of hypoglycemia.   5. Acute on chronic diastolic HCF (congestive heart failure) is stable EF 65-70% (05-08-20) is on 1200 cc fluid restriction; demadex 20 mg twice daily   6. Hyperlipidemia associated with type 2 diabetes mellitus: is stable will continue lipitor 20 mg daily   7. CKD stage 2 due to type 2 diabetes mellitus/acute renal failure superimposed on stage 3b chronic kidney disease unspecified acute renal failure type : is stable bun 39 creat 1.52  8. Lumbar stenosis with neurogenic claudication: is stable will monitor has tylenol cr every 8 hours as needed  9. Vitamin D deficiency: is stable will continue vitamin D 50,000 units weekly   10. Protein calorie malnutrition severe: albumin 2.6 will continue will begin prostat 30 ml three times daily will stop glucerna due to fluid restriction   Will get cbc; bmp     MD is aware of resident's narcotic use and is in agreement with current plan of care. We will attempt to wean resident as appropriate.  Ok Edwards NP [facility]  Contact [phone] Monday through Friday 8am- 5pm  After hours call [phone]

## 2020-07-12 ENCOUNTER — Encounter: Payer: Self-pay | Admitting: Internal Medicine

## 2020-07-12 ENCOUNTER — Non-Acute Institutional Stay (SKILLED_NURSING_FACILITY): Payer: Medicare HMO | Admitting: Internal Medicine

## 2020-07-12 DIAGNOSIS — E1122 Type 2 diabetes mellitus with diabetic chronic kidney disease: Secondary | ICD-10-CM | POA: Diagnosis not present

## 2020-07-12 DIAGNOSIS — I130 Hypertensive heart and chronic kidney disease with heart failure and stage 1 through stage 4 chronic kidney disease, or unspecified chronic kidney disease: Secondary | ICD-10-CM | POA: Diagnosis not present

## 2020-07-12 DIAGNOSIS — I5022 Chronic systolic (congestive) heart failure: Secondary | ICD-10-CM | POA: Diagnosis not present

## 2020-07-12 DIAGNOSIS — K921 Melena: Secondary | ICD-10-CM

## 2020-07-12 DIAGNOSIS — Z515 Encounter for palliative care: Secondary | ICD-10-CM | POA: Diagnosis not present

## 2020-07-12 DIAGNOSIS — I5033 Acute on chronic diastolic (congestive) heart failure: Secondary | ICD-10-CM | POA: Diagnosis not present

## 2020-07-12 DIAGNOSIS — N1832 Chronic kidney disease, stage 3b: Secondary | ICD-10-CM

## 2020-07-12 DIAGNOSIS — I10 Essential (primary) hypertension: Secondary | ICD-10-CM | POA: Diagnosis not present

## 2020-07-12 DIAGNOSIS — J9601 Acute respiratory failure with hypoxia: Secondary | ICD-10-CM | POA: Diagnosis not present

## 2020-07-12 DIAGNOSIS — I480 Paroxysmal atrial fibrillation: Secondary | ICD-10-CM

## 2020-07-12 DIAGNOSIS — I129 Hypertensive chronic kidney disease with stage 1 through stage 4 chronic kidney disease, or unspecified chronic kidney disease: Secondary | ICD-10-CM | POA: Diagnosis not present

## 2020-07-12 DIAGNOSIS — F39 Unspecified mood [affective] disorder: Secondary | ICD-10-CM

## 2020-07-12 DIAGNOSIS — R69 Illness, unspecified: Secondary | ICD-10-CM | POA: Diagnosis not present

## 2020-07-12 NOTE — Assessment & Plan Note (Signed)
BP controlled; no change in antihypertensive medications  

## 2020-07-12 NOTE — Assessment & Plan Note (Addendum)
Diabetes is uncontrolled as documented by an A1c of 8.3%.  In view of his multiple advanced comorbidities and advanced age; A1c goal would be 8% or less.  Most important is to avoid hypoglycemia.  The hyperglycemia was exacerbated by IV steroids for possible amiodarone induced pneumonitis.  Glipizide has appropriately been discontinued as it would have high risk of hypoglycemia were he to have any residual pancreatic function. Here at the SNF glucoses are checked 3 times daily and range from 171 up to 323.  At this time he is averaging approximately 15 units of NovoLog with every meal.  A contributing factor is the fact that he is still on prednisone.

## 2020-07-12 NOTE — Patient Instructions (Signed)
See assessment and plan under each diagnosis in the problem list and acutely for this visit 

## 2020-07-12 NOTE — Assessment & Plan Note (Signed)
Today's weight 189.5 pounds.  Fluid restrictions and weight monitor in place @ SNF

## 2020-07-12 NOTE — Progress Notes (Signed)
NURSING HOME LOCATION:  [facility] ROOM NUMBER:    CODE STATUS: DNR  PCP: Elvia Collum, DO  This is a comprehensive admission note to [facility] performed on this date less than 30 days from date of admission. Included are preadmission medical/surgical history; reconciled medication list; family history; social history and comprehensive review of systems.  Corrections and additions to the records were documented. Comprehensive physical exam was also performed. Additionally a clinical summary was entered for each active diagnosis pertinent to this admission in the Problem List to enhance continuity of care.  HPI: Patient was hospitalized 11/29-12/06/2020 admitted from home with acute respiratory failure with hypoxia possibly related to amiodarone induced pneumonitis. Sed rate was noted to be as high as 130. Initially oxygen need was as high as 15 L but this was subsequently able to be weaned to 2 L/min.  Also BiPAP was initiated. Clinically the possibility of pneumonia/infection and CHF/pulmonary edema contributions could not be totally ruled out.  Lactic acid did peak at 3.4; this along with leukocytosis and tachypnea at admission did suggest severe sepsis.  Pulmonology prescribed IV Solu-Medrol 12/3-12/7 with subsequent transition to prednisone 40 mg orally with decrease in dose by 10 mg every week.  Significant improvement occurred with the parenteral steroids.  He did receive ceftriaxone and doxycycline for the possibility of CAP/lobar pneumonia. Anemia prompted transfusion of packed red cells which caused some acute on chronic diastolic congestive heart failure.  Bumex was continued twice daily with subsequent transition back to home torsemide  increased to twice daily as well.  Fluid restriction was initiated and continued. Baseline hemoglobin was thought to be 9; at admission hemoglobin was 7.5.  GI consulted for hematochezia.  Apixaban for PAF was held because of the GI bleeding.   GI recommended holding the anticoagulant until he was stable and could have endoluminal evaluation performed as an outpatient.  81 mg aspirin was initiated and lieu of the apixaban.  Patient received a total of 2 units of PRBCs during his hospitalization with improvement in hemoglobin to 9. NovoLog sliding scale insulin was initiated for uncontrolled diabetes as documented by an A1c of 8.3%.  Glipizide was held during hospitalization.  The steroids did cause significant hyperglycemia requiring adjustments in the Levemir and NovoLog with meals. Palliative Care consulted resulting in a DNR/DNI with full scope of treatment plan.  Past medical and surgical history: Includes history of renal insufficiency, essential hypertension, dyslipidemia, history of nephrolithiasis, history of fatty liver, CAD, history of  PAF, history of adenomatous colon polyps, history of asthma, diastolic congestive heart failure, and diabetes with CKD.  Hematology has prescribed parenteral iron and Retacrit for macrocytic anemia. Surgeries and procedures include DCCV, carpal tunnel release, cholecystectomy, coronary angiography, and lumbar laminectomy/microdiscectomy.  Social history: Nondrinker, former smoker.  Family history: Extensive history reviewed   Review of systems:  Could not be completed due to neurocognitive & mood changes.He denies any active issues.  He was obviously frustrated and exhibited veiled anger.  This frustration was validated by his significant other who was in the room with him.  He made comments such as " 3 people asked the same questions today and the whole thing is on my chart".  He went on to aver that "if I can get outside and die it would be just fine".  He went on to add that "I am not a violent man but..." without finishing the sentence.  He did become less agitated when I asked what he had  done prior to retiring.  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 Subsequently I talked with his daughter who came because he insisted on leaving.She also had called in his minister to try to convince him to stay. I explained that he may have a pre-existing neurocognitive deficit exacerbated by the severe hypoxia & anemia, steroids & strange environment.  Physical exam:  Pertinent or positive findings: He sat in the wheelchair and was uncommunicative as noted above.  He avoided eye contact.  He does have pattern alopecia.  He he is hard of hearing.  There is exotropia of the left eye intermittently.  He has complete dentures.  Heart sounds are distant and somewhat irregular.  There is suggestion of a possible faint murmur.  Breath sounds are also markedly decreased.  Abdomen is protuberant.  He has 1/2+ edema at the sock line.  Posterior tibial pulses are stronger than the dorsalis pedis pulses.  The right lower extremity clinically seem to be somewhat weaker to opposition than the other extremities.  General appearance: Adequately nourished; no acute distress, increased work of breathing is present.   Lymphatic: No lymphadenopathy about the head, neck, axilla. Eyes: No conjunctival inflammation or lid edema is present. There is no scleral icterus. Ears:  External ear exam shows no significant lesions or deformities.   Nose:  External nasal examination shows no deformity or inflammation. Nasal mucosa are pink and moist without lesions, exudates Oral exam: Lips and gums are healthy appearing.There is no oropharyngeal erythema or exudate. Neck:  No thyromegaly, masses, tenderness noted.    Heart:  No gallop, murmur, click, rub.  Lungs:  without wheezes, rhonchi, rales, rubs. Abdomen: Bowel sounds are normal.  Abdomen is soft and nontender with no organomegaly, hernias, masses. GU: Deferred  Extremities:  No cyanosis, clubbing. Neurologic exam: Balance, Rhomberg, finger to nose testing could not be completed due to clinical state Skin: Warm & dry w/o  tenting. No significant lesions or rash.  See clinical summary under each active problem in the Problem List with associated updated therapeutic plan

## 2020-07-12 NOTE — Assessment & Plan Note (Addendum)
He remains on low-dose aspirin and lieu of the apixaban until the etiology of hematochezia can be determined with outpatient GI evaluation. Today pulse is 60 indicating excellent rate control; but close monitor indicated as on two rate limiting meds ( metoprolol & diltiazem).

## 2020-07-12 NOTE — Assessment & Plan Note (Signed)
O2 sats presently 97% on 2 L/min nasal oxygen.  He remains afebrile.

## 2020-07-12 NOTE — Assessment & Plan Note (Signed)
No active bleeding reported at this time.

## 2020-07-13 DIAGNOSIS — F39 Unspecified mood [affective] disorder: Secondary | ICD-10-CM | POA: Insufficient documentation

## 2020-07-13 NOTE — Assessment & Plan Note (Signed)
See narrative in ROS Agitation, ?suicidal ideation & possibly veiled threat This may be indicative of pre-existing neurocognitive deficit exacerbated by acute illness with severe hypoxia & anemia;high dose steroids & strange environment  PCP can pursue MMSE evaluation once stable & back in familiar home environment. Such could not be completed 12/17 due to agitation about "too many questions!" This was discussed with his daughter

## 2020-07-14 LAB — JAK2 GENOTYPR

## 2020-07-15 ENCOUNTER — Non-Acute Institutional Stay (SKILLED_NURSING_FACILITY): Payer: Medicare HMO | Admitting: Adult Health

## 2020-07-15 ENCOUNTER — Other Ambulatory Visit: Payer: Self-pay | Admitting: Adult Health

## 2020-07-15 ENCOUNTER — Encounter: Payer: Self-pay | Admitting: Adult Health

## 2020-07-15 ENCOUNTER — Encounter (HOSPITAL_COMMUNITY)
Admission: RE | Admit: 2020-07-15 | Discharge: 2020-07-15 | Disposition: A | Discharge status: Home / Self Care | Payer: Medicare HMO | Source: Skilled Nursing Facility | Attending: Adult Health | Admitting: Adult Health

## 2020-07-15 DIAGNOSIS — R6520 Severe sepsis without septic shock: Secondary | ICD-10-CM

## 2020-07-15 DIAGNOSIS — J9601 Acute respiratory failure with hypoxia: Secondary | ICD-10-CM | POA: Diagnosis not present

## 2020-07-15 DIAGNOSIS — I130 Hypertensive heart and chronic kidney disease with heart failure and stage 1 through stage 4 chronic kidney disease, or unspecified chronic kidney disease: Secondary | ICD-10-CM | POA: Insufficient documentation

## 2020-07-15 DIAGNOSIS — E1122 Type 2 diabetes mellitus with diabetic chronic kidney disease: Secondary | ICD-10-CM | POA: Diagnosis not present

## 2020-07-15 DIAGNOSIS — N1832 Chronic kidney disease, stage 3b: Secondary | ICD-10-CM | POA: Diagnosis not present

## 2020-07-15 DIAGNOSIS — A419 Sepsis, unspecified organism: Secondary | ICD-10-CM | POA: Diagnosis not present

## 2020-07-15 DIAGNOSIS — I129 Hypertensive chronic kidney disease with stage 1 through stage 4 chronic kidney disease, or unspecified chronic kidney disease: Secondary | ICD-10-CM

## 2020-07-15 DIAGNOSIS — J189 Pneumonia, unspecified organism: Secondary | ICD-10-CM

## 2020-07-15 LAB — BASIC METABOLIC PANEL
Anion gap: 11 (ref 5–15)
BUN: 57 mg/dL — ABNORMAL HIGH (ref 8–23)
CO2: 30 mmol/L (ref 22–32)
Calcium: 8.4 mg/dL — ABNORMAL LOW (ref 8.9–10.3)
Chloride: 95 mmol/L — ABNORMAL LOW (ref 98–111)
Creatinine, Ser: 1.88 mg/dL — ABNORMAL HIGH (ref 0.61–1.24)
GFR, Estimated: 37 mL/min — ABNORMAL LOW (ref >60)
Glucose, Bld: 248 mg/dL — ABNORMAL HIGH (ref 70–99)
Potassium: 3 mmol/L — ABNORMAL LOW (ref 3.5–5.1)
Sodium: 136 mmol/L (ref 135–145)

## 2020-07-15 LAB — CBC
HCT: 28.5 % — ABNORMAL LOW (ref 39.0–52.0)
Hemoglobin: 9.4 g/dL — ABNORMAL LOW (ref 13.0–17.0)
MCH: 34.2 pg — ABNORMAL HIGH (ref 26.0–34.0)
MCHC: 33 g/dL (ref 30.0–36.0)
MCV: 103.6 fL — ABNORMAL HIGH (ref 80.0–100.0)
Platelets: 111 10*3/uL — ABNORMAL LOW (ref 150–400)
RBC: 2.75 MIL/uL — ABNORMAL LOW (ref 4.22–5.81)
RDW: 21.1 % — ABNORMAL HIGH (ref 11.5–15.5)
WBC: 29.2 10*3/uL — ABNORMAL HIGH (ref 4.0–10.5)
nRBC: 0 % (ref 0.0–0.2)

## 2020-07-15 MED ORDER — DILTIAZEM HCL ER COATED BEADS 360 MG PO CP24: 360.0000 mg | ORAL_CAPSULE | Freq: Every day | ORAL | 0 refills | Status: DC

## 2020-07-15 MED ORDER — METOPROLOL TARTRATE 25 MG PO TABS: 25.0000 mg | ORAL_TABLET | Freq: Two times a day (BID) | ORAL | 0 refills | Status: DC

## 2020-07-15 MED ORDER — ATORVASTATIN CALCIUM 20 MG PO TABS: 20.0000 mg | ORAL_TABLET | Freq: Every day | ORAL | 0 refills | Status: DC

## 2020-07-15 MED ORDER — EMPAGLIFLOZIN 10 MG PO TABS: 10.0000 mg | ORAL_TABLET | Freq: Every day | ORAL | 0 refills | Status: DC

## 2020-07-15 MED ORDER — TORSEMIDE 20 MG PO TABS: 20.0000 mg | ORAL_TABLET | Freq: Two times a day (BID) | ORAL | 0 refills | Status: DC

## 2020-07-15 MED ORDER — INSULIN ASPART 100 UNIT/ML ~~LOC~~ SOLN
15.0000 [IU] | Freq: Three times a day (TID) | SUBCUTANEOUS | 0 refills | Status: DC
Start: 2020-07-15 — End: 2020-08-14

## 2020-07-15 MED ORDER — VITAMIN D (ERGOCALCIFEROL) 1.25 MG (50000 UNIT) PO CAPS: 50000.0000 [IU] | ORAL_CAPSULE | ORAL | 0 refills | Status: DC

## 2020-07-15 MED ORDER — PREDNISONE 10 MG PO TABS: 10.0000 mg | ORAL_TABLET | Freq: Every day | ORAL | 0 refills | Status: DC

## 2020-07-15 NOTE — Progress Notes (Signed)
Location:    [facility] Room Number: 135/P Place of Service:  SNF (31)    CODE STATUS: DNR  Allergies  Allergen Reactions  . Demerol Nausea And Vomiting  . Vasotec [Enalapril] Cough  . Lasix [Furosemide] Rash    Chief Complaint  Patient presents with  . Discharge Note    Discharge Visit     HPI:  He is being discharged to home with home health for pt/ot. He will need a front wheel walker. He has 02 at 2 liter/[place]. Room air 02 sat with activity 85 %; with 02 98%. He will need to get k+ po today for a potassium level of 3.0. he will need his prescriptions written and will need to follow up with his medical provider. He was hospitalized for pneumonia sepsis. He was admitted to this facility for short term rehab. He is now ready to complete therapy on a home health basis.    Past Medical History:  Diagnosis Date  . Arthritis   . Asthma   . Atrial fibrillation ([place])    a. s/p DCCV in 03/2019  . CAD (coronary artery disease) 06/29/2019  . Cramps of left lower extremity   . Depression   . Diabetes mellitus    type 2 for 7-8 yrs  . Dyspnea   . Elevated liver enzymes   . Fatty liver   . History of kidney stones   . Hyperlipidemia   . Hypertension   . Renal insufficiency   . Vertigo     Past Surgical History:  Procedure Laterality Date  . CARDIOVERSION N/A 04/21/2019   Procedure: CARDIOVERSION;  Surgeon: Sanda Klein, MD;  Location: [facility];  Service: Cardiovascular;  Laterality: N/A;  . CARPAL TUNNEL RELEASE     both wrist  . cataract surgery     bilateral  . CHOLECYSTECTOMY    . COLONOSCOPY  2011  . EYE SURGERY    . HEMORROIDECTOMY    . KNEE SURGERY Left   . LEFT HEART CATH AND CORONARY ANGIOGRAPHY N/A 05/23/2019   Procedure: LEFT HEART CATH AND CORONARY ANGIOGRAPHY;  Surgeon: Belva Crome, MD;  Location: [facility];  Service: Cardiovascular;  Laterality: N/A;  . LUMBAR LAMINECTOMY/DECOMPRESSION MICRODISCECTOMY N/A  08/06/2016   Procedure: LUMBAR THREE- LUMBAR FIVE  DECOMPRESSIVE LUMBAR LAMINECTOMY;  Surgeon: Jovita Gamma, MD;  Location: [facility];  Service: Neurosurgery;  Laterality: N/A;    Social History   Socioeconomic History  . Marital status: Divorced    Spouse name: Not on file  . Number of children: 2  . Years of education: Not on file  . Highest education level: Not on file  Occupational History  . Occupation: RETIRED  Tobacco Use  . Smoking status: Former Smoker    Packs/day: 4.00    Years: 20.00    Pack years: 80.00    Types: Cigarettes    Quit date: 10/14/1982    Years since quitting: 37.7  . Smokeless tobacco: Never Used  . Tobacco comment: 29 yrs ago  Vaping Use  . Vaping Use: Never used  Substance and Sexual Activity  . Alcohol use: No  . Drug use: No  . Sexual activity: Not Currently    Birth control/protection: None  Other Topics Concern  . Not on file  Social History Narrative  . Not on file   Social Determinants of Health   Financial Resource Strain: Not on file  Food Insecurity: Not on file  Transportation Needs: Not on  file  Physical Activity: Not on file  Stress: Not on file  Social Connections: Not on file  Intimate Partner Violence: Not on file   Family History  Problem Relation Age of Onset  . Diabetes Mother   . Cancer Mother        unknown kind  . Diabetes Sister   . COPD Sister   . Liver disease Brother   . COPD Brother   . Diabetes Maternal Grandmother   . Diabetes Brother   . COPD Sister   . Healthy Daughter   . Healthy Daughter   . Colon cancer Neg Hx     VITAL SIGNS BP 134/64   Pulse 72   Temp 98 F (36.7 C)   Resp 20   Ht $R'5\' 10"'Ad$  (1.778 m)   Wt 188 lb 6.4 oz (85.5 kg)   SpO2 94%   BMI 27.03 kg/m   Patient's Medications  New Prescriptions   No medications on file  Previous Medications   ACETAMINOPHEN (TYLENOL) 650 MG CR TABLET    Take 650 mg by mouth every 8 (eight) hours as needed for pain.   AMINO ACIDS-PROTEIN  HYDROLYS (FEEDING SUPPLEMENT, PRO-STAT SUGAR FREE 64,) LIQD    Take 30 mLs by mouth 3 (three) times daily with meals. For albumin 2.6   ATORVASTATIN (LIPITOR) 20 MG TABLET    Take 1 tablet (20 mg total) by mouth daily.   BALSAM PERU-CASTOR OIL (VENELEX) OINT    Apply 1 application topically in the morning, at noon, and at bedtime. Every shift day, evening, and night. Apply to sacrum and bilateral buttocks   DILTIAZEM (CARDIZEM CD) 360 MG 24 HR CAPSULE    Take 1 capsule (360 mg total) by mouth daily.   EMPAGLIFLOZIN (JARDIANCE) 10 MG TABS TABLET    Take 1 tablet (10 mg total) by mouth daily before breakfast.   INSULIN ASPART (NOVOLOG) 100 UNIT/ML INJECTION    Inject 15 Units into the skin 3 (three) times daily with meals.   INSULIN DETEMIR (LEVEMIR) 100 UNIT/ML INJECTION    Inject 0.15 mLs (15 Units total) into the skin daily.   METOPROLOL TARTRATE (LOPRESSOR) 25 MG TABLET    Take 1 tablet (25 mg total) by mouth 2 (two) times daily.   NON FORMULARY    Diet: [place], NAS; thin liquids; 1200cc fluid restriction   OXYGEN    Inhale 2 L into the lungs continuous.   PREDNISONE (DELTASONE) 10 MG TABLET    Take 1 tablet (10 mg total) by mouth daily with breakfast. Take 30 mg from 07-16-20 through 07-17-20; then take 20 mg daily from 07-18-20 through 07-24-20 then take 10 mg daily from 07-25-20 through 07-31-20.   TORSEMIDE (DEMADEX) 20 MG TABLET    Take 1 tablet (20 mg total) by mouth 2 (two) times daily.   VITAMIN D, ERGOCALCIFEROL, (DRISDOL) 1.25 MG (50000 UNIT) CAPS CAPSULE    Take 1 capsule (50,000 Units total) by mouth every 7 (seven) days.  Modified Medications   No medications on file  Discontinued Medications   POTASSIUM CHLORIDE SA (KLOR-CON) 20 MEQ TABLET    Take 60 mEq by mouth daily.     SIGNIFICANT DIAGNOSTIC EXAMS   PREVIOUS   06-25-20: abdominal ultrasound:  1. Hepatic steatosis, mild. 2. No biliary duct dilation post cholecystectomy. 3. RIGHT-sided pleural effusion partially  imaged.  06-27-20: chest x-ray:  1. Stable cardiomegaly. 2. Progressive diffuse severe bilateral pulmonary infiltrates/edema.  07-02-20: chest x-ray:  1. Cardiomegaly. 2. Diffuse bilateral  interstitial infiltrates again noted. Slight improvement from prior exam.  07-04-20: right upper extremity venous doppler:  1. No evidence of DVT within the right upper extremity. 2. Large amount of subcutaneous edema at the level of the right forearm  NO NEW EXAMS.   LABS REVIEWED: PREVIOUS   06-25-20: wbc 26.3; hgb 7.0; hct 22.8; mcv 121.3 plt 208; glucose 297; bun 45; creat 2.20; k+ 3.7; na++ 138; ca 8.0 iron 52; tibc 213; folate 29.1 vit B 12: 835; ferritin 1250; hgb a1c 8.3 06-27-20: BNP 625.0 sed rate 130 06-28-20: wbc 14.7; hgb 8.9; hct 28.9; mcv 105.1 plt 147; glucose 149; bun 26; creat 1.68; k+ 2.9; na++ 134; ca 7.6 06-30-20: wbc 9.5; hgb 8.2; hct 25.7; mcv 103.6 plt 148 glucose 364; bun 36; creat 1.72; k+ 3.4; na++ 131; ca 7.8 phos 3.5 mag 1.9; albumin 2.1 07-04-20: wbc 27.5; hgb 8.6; hct 27.5; mcv 102.6 plt 174; glucose 257; bun 51; creat 1.74; k+ 3.9; na++ 131; ca 8.2 07-08-20: wbc 39.1; hgb 9.3; hct 29.5; mv 105.4 plt 144; glucose 131; bun 39; creat 1.52; k+ 4.3; na++ 136; ca 8.5 ast 52; alt 88; alk phos 204; albumin 2.6   TODAY;   07-15-20: glucose 248; bun 57; creat 1.88; k+ 3.0; na++ 136   Review of Systems  Constitutional: Negative for malaise/fatigue.  Respiratory: Negative for cough and shortness of breath.   Cardiovascular: Negative for chest pain, palpitations and leg swelling.  Gastrointestinal: Negative for abdominal pain, constipation and heartburn.  Musculoskeletal: Negative for back pain, joint pain and myalgias.  Skin: Negative.   Neurological: Negative for dizziness.  Psychiatric/Behavioral: The patient is not nervous/anxious.    Physical Exam Constitutional:      General: He is not in acute distress.    Appearance: He is well-developed and well-nourished. He is not  diaphoretic.  Neck:     Thyroid: No thyromegaly.  Cardiovascular:     Rate and Rhythm: Normal rate. Rhythm irregular.     Pulses: Intact distal pulses.     Heart sounds: Murmur heard.      Comments: 2/6 Pulmonary:     Effort: Pulmonary effort is normal. No respiratory distress.     Breath sounds: Normal breath sounds.     Comments: 02 Abdominal:     General: Bowel sounds are normal. There is no distension.     Palpations: Abdomen is soft.     Tenderness: There is no abdominal tenderness.  Musculoskeletal:        General: No edema. Normal range of motion.     Cervical back: Neck supple.  Lymphadenopathy:     Cervical: No cervical adenopathy.  Skin:    General: Skin is warm and dry.  Neurological:     Mental Status: He is alert and oriented to person, place, and time.  Psychiatric:        Mood and Affect: Mood and affect and mood normal.      ASSESSMENT/ PLAN:   Patient is being discharged with the following home health services:  Pt/ot to evaluate and treat as indicated for gait balance strength adl training.   Patient is being discharged with the following durable medical equipment:  Front wheel walker to allow him to maintain his current level of independence with his adls.   Patient has been advised to f/u with their PCP in 1-2 weeks to bring them up to date on their rehab stay.  Social services at facility was responsible for arranging this appointment.  Pt  was provided with a 30 day supply of prescriptions for medications and refills must be obtained from their PCP.  For controlled substances, a more limited supply may be provided adequate until PCP appointment only.  Will give him one dose of k+ 60 meq now  30 day supply of his prescription [facility] [address]  Time spent with patient: 40 minutes for dme; medications home health.     Ok Edwards NP [facility]  Contact [phone] Monday through Friday 8am- 5pm  After  hours call [phone]

## 2020-07-16 ENCOUNTER — Telehealth: Payer: Self-pay | Admitting: Family Medicine

## 2020-07-16 ENCOUNTER — Encounter: Payer: Self-pay | Admitting: Family Medicine

## 2020-07-16 ENCOUNTER — Ambulatory Visit (INDEPENDENT_AMBULATORY_CARE_PROVIDER_SITE_OTHER): Payer: Medicare HMO | Admitting: Family Medicine

## 2020-07-16 ENCOUNTER — Other Ambulatory Visit: Payer: Self-pay

## 2020-07-16 VITALS — BP 134/70 | HR 58 | Temp 97.7°F | Ht 70.0 in | Wt 187.0 lb

## 2020-07-16 DIAGNOSIS — Z09 Encounter for follow-up examination after completed treatment for conditions other than malignant neoplasm: Secondary | ICD-10-CM | POA: Diagnosis not present

## 2020-07-16 DIAGNOSIS — I10 Essential (primary) hypertension: Secondary | ICD-10-CM | POA: Diagnosis not present

## 2020-07-16 DIAGNOSIS — R001 Bradycardia, unspecified: Secondary | ICD-10-CM

## 2020-07-16 DIAGNOSIS — R69 Illness, unspecified: Secondary | ICD-10-CM | POA: Diagnosis not present

## 2020-07-16 DIAGNOSIS — J189 Pneumonia, unspecified organism: Secondary | ICD-10-CM | POA: Diagnosis not present

## 2020-07-16 DIAGNOSIS — F32A Depression, unspecified: Secondary | ICD-10-CM

## 2020-07-16 DIAGNOSIS — F39 Unspecified mood [affective] disorder: Secondary | ICD-10-CM

## 2020-07-16 MED ORDER — SERTRALINE HCL 50 MG PO TABS: ORAL_TABLET | ORAL | 3 refills | Status: DC

## 2020-07-16 MED ORDER — POTASSIUM CHLORIDE ER 10 MEQ PO TBCR: 10.0000 meq | EXTENDED_RELEASE_TABLET | Freq: Every day | ORAL | 1 refills | Status: DC

## 2020-07-16 NOTE — Patient Instructions (Addendum)
Take jardiance, stop glipizide.  Continue with insulins.   Call NP Leonides Sake with cardiology for follow after hospital. Due to not being on eliquis, should he restart it and also discuss with hematologist.

## 2020-07-16 NOTE — Telephone Encounter (Signed)
Eliquis was stopped in hospital due to anemia (hgb yesterday was 9.4) pt wanting to know if he needs to re start this - his f/u appt is scheduled for February - pt is also to f/u with GI

## 2020-07-16 NOTE — Progress Notes (Signed)
Patient ID: Jack Mejia, male    DOB: May 19, 1946, 74 y.o.   MRN: [id number]   Chief Complaint  Patient presents with  . Hospitalization Follow-up   Subjective:    HPI  Hospital follow up for multifocal pneumonia.  Pt dx with acute respiratory failure with hypoxia 2nd to amidodarone induced pneumonitis. Unsure if pneumonia due to infection vs chf, vs pulm edema.  Pt was discharged from hospital to rehab.   Admit date: 06/24/2020 Discharge date: 07/10/2020 from hospital to SNF.  Pt left the [facility] early and wanting to go home.  Pt decided he wanted to go home early.   While in hospitla they stopped the amidoarone and treated with IV solumedrol.  Then dc with prednisone 40mg  taper for 1 wk.  Pt was on oxgyen while at hospital and was weaned down to less than 2L /min. Pt was neg for covid, mrsa, strep, legionella, resp viral panel.  Pt not sure if he needs to be taking eliquis and glipizide. He is not taking these meds currently and also not sure if he should be taking jardiance but he is currently taking it.  Per significant other, stating he was stopped on eliquis due to him having anemia and getting down to Hb 8 while in hospital.  It wasn't restarted at discharge.  Having trouble getting company to bring out oxygen. Pt did not company to [employer].   212 -[id number]- They stopped the eliquis due to anemia. Seeing hem/onc tomorrow.  Stopped amiodorone in the hospital also stopped glipizide.  Had low potassium at 3.0 in hospital.   Pt's significant other in room, stating over past 2 yrs his depression and mood worsening since his heart issues 2 yrs ago.  Gotten worse in the past year.  After each admission, worsening. Never tried anything for mood/depression. Still states he thinks about "killing himself." but won't act on it.  Dr. Linna Darner, physician at [facility] mentioned bradycardia seen on admission and wanting [place] to assess memory status and discuss  concerns about mood disorder/depression.  Medical History [name] has a past medical history of Arthritis, Asthma, Atrial fibrillation ([place]), CAD (coronary artery disease) (06/29/2019), Cramps of left lower extremity, Depression, Diabetes mellitus, Dyspnea, Elevated liver enzymes, Fatty liver, History of kidney stones, Hyperlipidemia, Hypertension, Renal insufficiency, and Vertigo.   Outpatient Encounter Medications as of 07/16/2020  Medication Sig  . acetaminophen (TYLENOL) 650 MG CR tablet Take 650 mg by mouth every 8 (eight) hours as needed for pain.  . Amino Acids-Protein Hydrolys (FEEDING SUPPLEMENT, PRO-STAT SUGAR FREE 64,) LIQD Take 30 mLs by mouth 3 (three) times daily with meals. For albumin 2.6  . atorvastatin (LIPITOR) 20 MG tablet Take 1 tablet (20 mg total) by mouth daily.  Roseanne Kaufman [place]-Castor Oil (VENELEX) OINT Apply 1 application topically in the morning, at noon, and at bedtime. Every shift day, evening, and night. Apply to sacrum and bilateral buttocks  . diltiazem (CARDIZEM CD) 360 MG 24 hr capsule Take 1 capsule (360 mg total) by mouth daily.  . empagliflozin (JARDIANCE) 10 MG TABS tablet Take 1 tablet (10 mg total) by mouth daily before breakfast.  . insulin aspart (NOVOLOG) 100 UNIT/ML injection Inject 15 Units into the skin 3 (three) times daily with meals.  . insulin detemir (LEVEMIR) 100 UNIT/ML injection Inject 0.15 mLs (15 Units total) into the skin daily.  . metoprolol tartrate (LOPRESSOR) 25 MG tablet Take 1 tablet (25 mg total) by mouth 2 (two) times  daily.  . NON FORMULARY Diet: [place], NAS; thin liquids; 1200cc fluid restriction  . predniSONE (DELTASONE) 10 MG tablet Take 1 tablet (10 mg total) by mouth daily with breakfast. Take 30 mg from 07-16-20 through 07-17-20; then take 20 mg daily from 07-18-20 through 07-24-20 then take 10 mg daily from 07-25-20 through 07-31-20.  [name] OXYGEN Inhale 2 L into the lungs continuous. (Patient not taking: Reported on 07/16/2020)  .  potassium chloride (KLOR-CON) 10 MEQ tablet Take 1 tablet (10 mEq total) by mouth daily.  . sertraline (ZOLOFT) 50 MG tablet Take 1/2 tablet p.o. qhs for 1 wk, then increase to 1 tab daily.  [name] torsemide (DEMADEX) 20 MG tablet Take 1 tablet (20 mg total) by mouth 2 (two) times daily.  . Vitamin D, Ergocalciferol, (DRISDOL) 1.25 MG (50000 UNIT) CAPS capsule Take 1 capsule (50,000 Units total) by mouth every 7 (seven) days.   No facility-administered encounter medications on file as of 07/16/2020.     Review of Systems  Constitutional: Negative for chills and fever.  HENT: Negative for congestion, rhinorrhea and sore throat.   Respiratory: Negative for cough, shortness of breath and wheezing.   Cardiovascular: Negative for chest pain and leg swelling.  Gastrointestinal: Negative for abdominal pain, diarrhea, nausea and vomiting.  Genitourinary: Negative for dysuria and frequency.  Skin: Negative for rash.  Neurological: Negative for dizziness, weakness and headaches.  Psychiatric/Behavioral: Positive for dysphoric mood and suicidal ideas (thoughts in the past). Negative for agitation, confusion, decreased concentration, self-injury and sleep disturbance. The patient is not nervous/anxious.      Vitals BP 134/70   Pulse (!) 58   Temp 97.7 F (36.5 C)   Ht 5\' 10"  (1.778 m)   Wt 187 lb (84.8 kg)   SpO2 99%   BMI 26.83 kg/m   Objective:   Physical Exam Vitals and nursing note reviewed.  Constitutional:      General: He is not in acute distress.    Appearance: Normal appearance. He is not ill-appearing.  HENT:     Head: Normocephalic.     Nose: Nose normal. No congestion.     Mouth/Throat:     Mouth: Mucous membranes are moist.     Pharynx: No oropharyngeal exudate.  Eyes:     Extraocular Movements: Extraocular movements intact.     Conjunctiva/sclera: Conjunctivae normal.     Pupils: Pupils are equal, round, and reactive to light.  Cardiovascular:     Rate and Rhythm:  Regular rhythm. Bradycardia present.     Pulses: Normal pulses.     Heart sounds: Normal heart sounds. No murmur heard.   Pulmonary:     Effort: Pulmonary effort is normal.     Breath sounds: Normal breath sounds. No wheezing, rhonchi or rales.  Musculoskeletal:        General: Normal range of motion.     Right lower leg: No edema.     Left lower leg: No edema.  Skin:    General: Skin is warm and dry.     Findings: No rash.  Neurological:     General: No focal deficit present.     Mental Status: He is alert and oriented to person, place, and time.     Cranial Nerves: No cranial nerve deficit.  Psychiatric:        Mood and Affect: Mood normal.        Behavior: Behavior normal.        Thought Content: Thought content normal.  Judgment: Judgment normal.      Assessment and Plan   1. Hospital discharge follow-up  2. Primary hypertension - Basic metabolic panel  3. Mood disorder ([place])  4. Depression, unspecified depression type  5. Bradycardia  6. Multifocal pneumonia    Acute respiratory failure with hypoxia and Multifocal Pneumonia- improved.  Pt finishing steroid taper.  Pt hasn't received oxygen at home yet.  Will call the office after pt calls with the company to find out where the oxygen is. Pt doing well on RA at rest w/o oxygen in office.   Depression/mood disorder- Worsening. Pt not having a plan to harm himself.  Has safety plan with significant other. Will give MMSE and discuss results further on next visit after starting pt on zoloft to see if we can help with his depression and mood disorder.  Pt scored 30/30 on MMSE today.  Long discussion about his concerns of self harm and his depression. Pt willing to try a medication to help with his mood/depression.  Afib, CHF, CAD- Advising pt to f/u with cardiology and call their office for appt to follow up on his anemia and re-starting eliquis or cont with 81mg  asa.  Pt also to f/u with bradycardia  with cardiology.  At 58bpm today, stable, asymptomatic.   Macrocytic anemia/blood transfusion, iron def anemia- Cont f/u with hematologist about anemia this week.  Dm2- hyperglycemia 2nd to steroids.  Pt on novolog and lantus.  Pt may need glipizide back and insulins decreased as the steroids are tapered off.   Hematochezia- Pt to see GI to f/u on hematochezia and the eliquis was held due to this while in hospital. Per discharge summary GI was scheduled with close follow up.  Pt to start 81mg  asa instead of eliquis.   F/u 4 wks.

## 2020-07-16 NOTE — Telephone Encounter (Signed)
Tell him not to restart until he is seen by Dr. Jenetta Downer GI on July 19.  Depending on what Dr. Jenetta Downer says we may restart it, but not until then.  Thank you

## 2020-07-16 NOTE — Progress Notes (Signed)
Jack Mejia, [place] [address]   CLINIC:  Medical Oncology/Hematology  PCP:  Erven Colla, DO [address] / [place] [address]  [phone]  REASON FOR VISIT:  Follow-up for macrocytic anemia  PRIOR THERAPY: Venofer  CURRENT THERAPY: Retacrit every 2 weeks  INTERVAL HISTORY:  Jack Mejia is a 74 y.o. male with a diagnosis of a macrocytic anemia.  He was last seen by Dr. Sanda Linger on 04/29/2020.  Since his last visit he was hospitalized from 06/06/2020 through 06/07/2020 for a hyper osmolar hyperglycemic state.  His blood glucose was noted to be 846 at the time of his admission. He was next admitted from 06/24/2020 through 07/10/2020 for acute respiratory failure.  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  According to his discharge summary his caregivers were unable to completely exclude the possibility of pneumonia/infection and CHF/pulmonary edema as being contributory.  Amiodarone was stopped and he was begun on IV Solu-Medrol and then transition to oral prednisone.  Plans were made to begin weaning him from oxygen to less than 2 L/min from 15 L.  He was discharged to skilled nursing facility.  Jack Mejia is currently not using supplemental oxygen.  He presents to the clinic today with his wife.  He reports that he has no energy and that his appetite is only approximately at 90% of what it had been.  He denies pain.  He reports that his tongue is peeling and feels raw, he has anxiety, depression, and problems with sleep. A CBC was collected on 07/15/2020 and returned with a WBC of 29.2, hemoglobin 9.4, hematocrit 28.5, and platelet count of 111.   REVIEW OF SYSTEMS:  Review of Systems  Constitutional: Positive for malaise/fatigue. Negative for chills, diaphoresis and fever.  HENT: Positive for hearing loss (The patient is wearing a hearing aid.). Negative for congestion and sore throat.         Tongue sore and peeling  Eyes: Negative.   Respiratory: Negative for cough, hemoptysis, sputum production, shortness of breath and wheezing.   Cardiovascular: Negative for chest pain, palpitations, orthopnea, claudication and leg swelling.  Gastrointestinal: Negative for abdominal pain, blood in stool, constipation, diarrhea, melena, nausea and vomiting.  Genitourinary: Negative.   Musculoskeletal: Negative for back pain and myalgias.  Skin: Negative for itching and rash.  Neurological: Negative for dizziness, tingling and headaches.  Endo/Heme/Allergies: Negative.   Psychiatric/Behavioral: Positive for depression. The patient is nervous/anxious and has insomnia.      PAST MEDICAL/SURGICAL HISTORY:  Past Medical History:  Diagnosis Date  . Arthritis   . Asthma   . Atrial fibrillation ([place])    a. s/p DCCV in 03/2019  . CAD (coronary artery disease) 06/29/2019  . Cramps of left lower extremity   . Depression   . Diabetes mellitus    type 2 for 7-8 yrs  . Dyspnea   . Elevated liver enzymes   . Fatty liver   . History of kidney stones   . Hyperlipidemia   . Hypertension   . Renal insufficiency   . Vertigo    Past Surgical History:  Procedure Laterality Date  . CARDIOVERSION N/A 04/21/2019   Procedure: CARDIOVERSION;  Surgeon: Sanda Klein, MD;  Location: [facility];  Service: Cardiovascular;  Laterality: N/A;  . CARPAL TUNNEL RELEASE     both wrist  . cataract surgery     bilateral  . CHOLECYSTECTOMY    . COLONOSCOPY  2011  . EYE SURGERY    . HEMORROIDECTOMY    . KNEE SURGERY Left   . LEFT HEART CATH AND CORONARY ANGIOGRAPHY N/A 05/23/2019   Procedure: LEFT HEART CATH AND CORONARY ANGIOGRAPHY;  Surgeon: Belva Crome, MD;  Location: [facility];  Service: Cardiovascular;  Laterality: N/A;  . LUMBAR LAMINECTOMY/DECOMPRESSION MICRODISCECTOMY N/A 08/06/2016   Procedure: LUMBAR THREE- LUMBAR FIVE  DECOMPRESSIVE LUMBAR LAMINECTOMY;  Surgeon: Jovita Gamma, MD;  Location: [facility];  Service: Neurosurgery;  Laterality: N/A;    SOCIAL HISTORY:  Social History   Socioeconomic History  . Marital status: Divorced    Spouse name: Not on file  . Number of children: 2  . Years of education: Not on file  . Highest education level: Not on file  Occupational History  . Occupation: RETIRED  Tobacco Use  . Smoking status: Former Smoker    Packs/day: 4.00    Years: 20.00    Pack years: 80.00    Types: Cigarettes    Quit date: 10/14/1982    Years since quitting: 37.7  . Smokeless tobacco: Never Used  . Tobacco comment: 29 yrs ago  Vaping Use  . Vaping Use: Never used  Substance and Sexual Activity  . Alcohol use: No  . Drug use: No  . Sexual activity: Not Currently    Birth control/protection: None  Other Topics Concern  . Not on file  Social History Narrative  . Not on file   Social Determinants of Health   Financial Resource Strain: Not on file  Food Insecurity: Not on file  Transportation Needs: Not on file  Physical Activity: Not on file  Stress: Not on file  Social Connections: Not on file  Intimate Partner Violence: Not on file    FAMILY HISTORY:  Family History  Problem Relation Age of Onset  . Diabetes Mother   . Cancer Mother        unknown kind  . Diabetes Sister   . COPD Sister   . Liver disease Brother   . COPD Brother   . Diabetes Maternal Grandmother   . Diabetes Brother   . COPD Sister   . Healthy Daughter   . Healthy Daughter   . Colon cancer Neg Hx     CURRENT MEDICATIONS:  Current Outpatient Medications  Medication Sig Dispense Refill  . acetaminophen (TYLENOL) 650 MG CR tablet Take 650 mg by mouth every 8 (eight) hours as needed for pain.    . Amino Acids-Protein Hydrolys (FEEDING SUPPLEMENT, PRO-STAT SUGAR FREE 64,) LIQD Take 30 mLs by mouth 3 (three) times daily with meals. For albumin 2.6    . atorvastatin (LIPITOR) 20 MG tablet Take 1 tablet (20 mg total) by mouth daily. 30 tablet 0  .  Balsam [place]-Castor Oil (VENELEX) OINT Apply 1 application topically in the morning, at noon, and at bedtime. Every shift day, evening, and night. Apply to sacrum and bilateral buttocks    . diltiazem (CARDIZEM CD) 360 MG 24 hr capsule Take 1 capsule (360 mg total) by mouth daily. 30 capsule 0  . empagliflozin (JARDIANCE) 10 MG TABS tablet Take 1 tablet (10 mg total) by mouth daily before breakfast. 30 tablet 0  . insulin aspart (NOVOLOG) 100 UNIT/ML injection Inject 15 Units into the skin 3 (three) times daily with meals. 15 mL 0  . insulin detemir (LEVEMIR) 100 UNIT/ML injection Inject 0.15 mLs (15 Units total) into the skin daily. 10 mL 11  . metoprolol tartrate (LOPRESSOR)  25 MG tablet Take 1 tablet (25 mg total) by mouth 2 (two) times daily. 30 tablet 0  . NON FORMULARY Diet: [place], NAS; thin liquids; 1200cc fluid restriction    . OXYGEN Inhale 2 L into the lungs continuous. (Patient not taking: Reported on 07/16/2020)    . potassium chloride (KLOR-CON) 10 MEQ tablet Take 1 tablet (10 mEq total) by mouth daily. 30 tablet 1  . predniSONE (DELTASONE) 10 MG tablet Take 1 tablet (10 mg total) by mouth daily with breakfast. Take 30 mg from 07-16-20 through 07-17-20; then take 20 mg daily from 07-18-20 through 07-24-20 then take 10 mg daily from 07-25-20 through 07-31-20. 26 tablet 0  . sertraline (ZOLOFT) 50 MG tablet Take 1/2 tablet p.o. qhs for 1 wk, then increase to 1 tab daily. 30 tablet 3  . torsemide (DEMADEX) 20 MG tablet Take 1 tablet (20 mg total) by mouth 2 (two) times daily. 60 tablet 0  . Vitamin D, Ergocalciferol, (DRISDOL) 1.25 MG (50000 UNIT) CAPS capsule Take 1 capsule (50,000 Units total) by mouth every 7 (seven) days. 4 capsule 0   No current facility-administered medications for this visit.    ALLERGIES:  Allergies  Allergen Reactions  . Demerol Nausea And Vomiting  . Vasotec [Enalapril] Cough  . Lasix [Furosemide] Rash    PHYSICAL EXAM:  Performance status (ECOG): 1 -  Symptomatic but completely ambulatory  There were no vitals filed for this visit. Wt Readings from Last 3 Encounters:  07/16/20 187 lb (84.8 kg)  07/15/20 188 lb 6.4 oz (85.5 kg)  07/12/20 189 lb 8 oz (86 kg)   Physical Exam Constitutional:      General: He is not in acute distress.    Appearance: He is not ill-appearing, toxic-appearing or diaphoretic.  HENT:     Head: Normocephalic and atraumatic.  Eyes:     General: No scleral icterus.       Right eye: No discharge.        Left eye: No discharge.     Conjunctiva/sclera: Conjunctivae normal.  Cardiovascular:     Rate and Rhythm: Normal rate and regular rhythm.     Heart sounds: No murmur heard. No friction rub. No gallop.   Pulmonary:     Effort: Pulmonary effort is normal. No respiratory distress.     Breath sounds: Normal breath sounds. No wheezing, rhonchi or rales.  Skin:    General: Skin is warm and dry.  Neurological:     Mental Status: He is alert.     Coordination: Coordination normal.     Gait: Gait abnormal (The patient is ambulating with a wheelchair.).     LABORATORY DATA:  I have reviewed the labs as listed.  CBC Latest Ref Rng & Units 07/15/2020 07/09/2020 07/08/2020  WBC 4.0 - 10.5 K/uL 29.2(H) 37.4(H) 44.5(H)  Hemoglobin 13.0 - 17.0 g/dL 9.4(L) 8.7(L) 9.3(L)  Hematocrit 39.0 - 52.0 % 28.5(L) 27.6(L) 29.3(L)  Platelets 150 - 400 K/uL 111(L) 135(L) 144(L)   CMP Latest Ref Rng & Units 07/15/2020 07/10/2020 07/09/2020  Glucose 70 - 99 mg/dL 248(H) 195(H) 231(H)  BUN 8 - 23 mg/dL 57(H) 43(H) 44(H)  Creatinine 0.61 - 1.24 mg/dL 1.88(H) 1.65(H) 1.65(H)  Sodium 135 - 145 mmol/L 136 133(L) 132(L)  Potassium 3.5 - 5.1 mmol/L 3.0(L) 4.0 4.0  Chloride 98 - 111 mmol/L 95(L) 92(L) 91(L)  CO2 22 - 32 mmol/L 30 30 32  Calcium 8.9 - 10.3 mg/dL 8.4(L) 8.5(L) 8.4(L)  Total Protein 6.5 -  8.1 g/dL - - -  Total Bilirubin 0.3 - 1.2 mg/dL - - -  Alkaline Phos 38 - 126 U/L - - -  AST 15 - 41 U/L - - -  ALT 0 - 44 U/L  - - -      Component Value Date/Time   RBC 2.75 (L) 07/15/2020 0730   MCV 103.6 (H) 07/15/2020 0730   MCV 101 (H) 09/07/2018 1638   MCH 34.2 (H) 07/15/2020 0730   MCHC 33.0 07/15/2020 0730   RDW 21.1 (H) 07/15/2020 0730   RDW 12.8 09/07/2018 1638   LYMPHSABS 3.4 07/09/2020 0650   LYMPHSABS 1.3 09/07/2018 1638   MONOABS 4.1 (H) 07/09/2020 0650   EOSABS 0.0 07/09/2020 0650   EOSABS 0.2 09/07/2018 1638   BASOSABS 0.0 07/09/2020 0650   BASOSABS 0.1 09/07/2018 1638   Lab Results  Component Value Date   VD25OH 19.52 (L) 06/06/2020   VD25OH 24.68 (L) 04/04/2020    DIAGNOSTIC IMAGING:  I have independently reviewed the scans and discussed with the patient. US RENAL  Result Date: 06/17/2020 CLINICAL DATA:  Chronic renal disease stage IV EXAM: RENAL / URINARY TRACT ULTRASOUND COMPLETE COMPARISON:  April 21, 2019 FINDINGS: Right Kidney: Renal measurements: 10.7 cm x 5.2 cm x 5.5 cm = volume: 162.9 mL. Echogenicity within normal limits. A 3.1 cm x 3.3 cm x 2.7 cm complex anechoic structure is seen within the mid right kidney. No hydronephrosis is visualized. Left Kidney: Renal measurements: 12.3 cm x 7.2 cm x 6.1 cm = volume: 283.3 mL. Echogenicity within normal limits. 2.1 cm x 2.1 cm x 2.3 cm and 3.0 cm x 2.1 cm x 2.4 cm anechoic structures are seen within the mid and lower left kidney. An 8.5 mm shadowing echogenic focus is also seen within the lower pole of the left kidney. No hydronephrosis is visualized. Bladder: Appears normal for degree of bladder distention. Bilateral ureteral jets are visualized. Other: None. IMPRESSION: 1. Bilateral renal cysts, mildly increased in size when compared to the prior study. 2. Subcentimeter non-obstructing renal stone within the lower pole of the left kidney. Electronically Signed   By: Aram Candela M.D.   On: 06/17/2020 23:57   US Venous Img Upper Uni Right(DVT)  Result Date: 07/04/2020 CLINICAL DATA:  Right upper extremity edema. History of  diabetes. Former smoker. Evaluate for DVT. EXAM: RIGHT UPPER EXTREMITY VENOUS DOPPLER ULTRASOUND TECHNIQUE: Gray-scale sonography with graded compression, as well as color Doppler and duplex ultrasound were performed to evaluate the upper extremity deep venous system from the level of the subclavian vein and including the jugular, axillary, basilic, radial, ulnar and upper cephalic vein. Spectral Doppler was utilized to evaluate flow at rest and with distal augmentation maneuvers. COMPARISON:  None. FINDINGS: Contralateral Subclavian Vein: Respiratory phasicity is normal and symmetric with the symptomatic side. No evidence of thrombus. Normal compressibility. Internal Jugular Vein: No evidence of thrombus. Normal compressibility, respiratory phasicity and response to augmentation. Subclavian Vein: No evidence of thrombus. Normal compressibility, respiratory phasicity and response to augmentation. Axillary Vein: No evidence of thrombus. Normal compressibility, respiratory phasicity and response to augmentation. Cephalic Vein: No evidence of thrombus. Normal compressibility, respiratory phasicity and response to augmentation. Basilic Vein: Not well visualized. Brachial Veins: No evidence of thrombus. Normal compressibility, respiratory phasicity and response to augmentation. Radial Veins: No evidence of thrombus. Normal compressibility, respiratory phasicity and response to augmentation. Ulnar Veins: No evidence of thrombus. Normal compressibility, respiratory phasicity and response to augmentation. Venous Reflux:  None visualized. Other  Findings: There is a large amount of subcutaneous edema at the level of the right forearm (images 34 through 38). IMPRESSION: 1. No evidence of DVT within the right upper extremity. 2. Large amount of subcutaneous edema at the level of the right forearm Electronically Signed   By: Simonne Come M.D.   On: 07/04/2020 15:16   DG Chest Port 1 View  Result Date: 07/02/2020 CLINICAL  DATA:  Pneumonitis. EXAM: PORTABLE CHEST 1 VIEW COMPARISON:  06/30/2020. FINDINGS: Cardiomegaly. Diffuse bilateral interstitial infiltrates again noted. Slight improvement from prior exam. No pleural effusion or pneumothorax. Thoracic spine scoliosis and degenerative change. IMPRESSION: 1. Cardiomegaly. 2. Diffuse bilateral interstitial infiltrates again noted. Slight improvement from prior exam. Electronically Signed   By: Maisie Fus  Register   On: 07/02/2020 07:30   DG CHEST PORT 1 VIEW  Result Date: 06/30/2020 CLINICAL DATA:  Sepsis, acute respiratory failure, pneumonia and pulmonary edema, acute on chronic CHF. EXAM: PORTABLE CHEST 1 VIEW COMPARISON:  Chest x-ray dated 06/27/2020 FINDINGS: Persistent bilateral airspace opacities, but slightly improved aeration of both lungs. No pleural effusion or pneumothorax is seen. Heart size and mediastinal contours are grossly stable. IMPRESSION: Slightly improved aeration of both lungs, but with persistent bilateral airspace opacities, compatible with multifocal pneumonia versus pulmonary edema. Electronically Signed   By: Bary Richard M.D.   On: 06/30/2020 05:49   DG CHEST PORT 1 VIEW  Result Date: 06/27/2020 CLINICAL DATA:  Shortness of breath. EXAM: PORTABLE CHEST 1 VIEW COMPARISON:  06/24/2020.  CT abdomen 06/19/2014. FINDINGS: Stable cardiomegaly. Progressive diffuse severe bilateral pulmonary infiltrates/edema. No prominent pleural effusion. No pneumothorax. Stable calcific densities noted over the right upper quadrant, most likely dystrophic calcification. Degenerative change thoracic spine and both shoulders. IMPRESSION: 1. Stable cardiomegaly. 2. Progressive diffuse severe bilateral pulmonary infiltrates/edema. Electronically Signed   By: Maisie Fus  Register   On: 06/27/2020 05:56   DG Chest Port 1 View  Result Date: 06/24/2020 CLINICAL DATA:  Shortness of breath. EXAM: PORTABLE CHEST 1 VIEW COMPARISON:  Dec 12, 2019. FINDINGS: Stable cardiomediastinal  silhouette. No pneumothorax or pleural effusion is noted. Bilateral patchy airspace opacities are now noted concerning for multifocal pneumonia. Bony thorax is unremarkable IMPRESSION: Bilateral patchy airspace opacities are noted concerning for multifocal pneumonia. Electronically Signed   By: Lupita Raider M.D.   On: 06/24/2020 11:37   US Abdomen Limited RUQ (LIVER/GB)  Result Date: 06/25/2020 CLINICAL DATA:  Elevated LFTs, post cholecystectomy EXAM: ULTRASOUND ABDOMEN LIMITED RIGHT UPPER QUADRANT COMPARISON:  September of 2018 CT evaluation FINDINGS: Gallbladder: Post cholecystectomy. Common bile duct: Diameter: 4.2 mm Liver: Mildly increased echogenicity of hepatic parenchyma without visible lesion on limited images. Portal vein is patent on color Doppler imaging with normal direction of blood flow towards the liver. Other: RIGHT pleural effusion, may be moderate. IMPRESSION: 1. Hepatic steatosis, mild. 2. No biliary duct dilation post cholecystectomy. 3. RIGHT-sided pleural effusion partially imaged. Electronically Signed   By: Donzetta Kohut M.D.   On: 06/25/2020 15:07     ASSESSMENT:  1. Macrocytic anemia: -Patient seen at the request of Dr. Gerda Diss for macrocytic anemia. -CTAP on 04/15/2017 showed no splenomegaly. -Patient has macrocytosis noted since February 2020, gradually worsening. -Colonoscopy on 06/05/2010 shows small polyp in the sigmoid colon colon, right colon, hepatic flexure and transverse colon. -He continues to have occasional bleeding on the tissue paper since Eliquis was started. -Serum erythropoietin was 77.2. -SPEP was negative. Other nutritional deficiencies were ruled out. -Bone marrow biopsy on 03/29/2020 shows hypercellular marrow with  trilineage dysplasia.  Blasts are 2%.  Chromosome analysis and FISH panel were normal. -5 doses of Venofer completed on 01/24/2020. -Retacrit 30,000 units weekly started on 04/08/2020. -CBC from 07/15/2020 returned with a hemoglobin of  9.4, hematocrit of 28.5, and an MCV of 103.6. -Jack Mejia will return in 1, 2, and 3 weeks for a Retacrit shot and labs.  2. CKD: -History of CKD since 2017. -Renal ultrasound on 04/21/2019 shows bilateral renal cysts. -Chemistry panel from 07/15/2020 returned with a creatinine slightly higher at 1.88   PLAN:  1.  Low risk MDS: -He was started on Retacrit 30,000 units on 04/08/2020. -His hemoglobin improved to 10.6. -We will increase Retacrit to 40,000 units and change the frequency to every 2 weeks. -We will reevaluate in 2 months.  Will check ferritin and iron panel at that time. -CBC from 07/15/2020 returned with a hemoglobin of 9.4, hematocrit of 28.5, and an MCV of 103.6. -Jack Mejia will return in 1, 2, and 3 weeks for a Retacrit shot and labs.   2. CKD: -Creatinine is stable  at 1.88  3. Atrial fibrillation: -Continue Eliquis 5 mg twice daily.  4.  Dyspnea on exertion: -He still continues to have dyspnea on exertion. --Echocardiogram completed on 05/08/2020 showing a left ventricular ejection fraction between 65 and 70%  Orders placed this encounter:  No orders of the defined types were placed in this encounter.

## 2020-07-16 NOTE — Telephone Encounter (Signed)
Pt aware to hold Eliquis until he sees GI 08/14/2020

## 2020-07-16 NOTE — Telephone Encounter (Signed)
Patient was taken off of his Eliquis while inpatient at [facility]. Patient is requesting to find out when he needs to go back on Eliquis. [phone]

## 2020-07-17 ENCOUNTER — Inpatient Hospital Stay (HOSPITAL_COMMUNITY): Payer: Medicare HMO | Attending: Hematology | Admitting: Medical

## 2020-07-17 VITALS — BP 143/58 | HR 54 | Temp 97.0°F | Resp 18 | Wt 185.1 lb

## 2020-07-17 DIAGNOSIS — Z79899 Other long term (current) drug therapy: Secondary | ICD-10-CM | POA: Insufficient documentation

## 2020-07-17 DIAGNOSIS — G4730 Sleep apnea, unspecified: Secondary | ICD-10-CM | POA: Diagnosis not present

## 2020-07-17 DIAGNOSIS — D469 Myelodysplastic syndrome, unspecified: Secondary | ICD-10-CM | POA: Diagnosis not present

## 2020-07-17 DIAGNOSIS — N189 Chronic kidney disease, unspecified: Secondary | ICD-10-CM | POA: Diagnosis not present

## 2020-07-17 DIAGNOSIS — R948 Abnormal results of function studies of other organs and systems: Secondary | ICD-10-CM | POA: Insufficient documentation

## 2020-07-17 DIAGNOSIS — R69 Illness, unspecified: Secondary | ICD-10-CM | POA: Diagnosis not present

## 2020-07-17 DIAGNOSIS — F418 Other specified anxiety disorders: Secondary | ICD-10-CM | POA: Insufficient documentation

## 2020-07-17 DIAGNOSIS — I4891 Unspecified atrial fibrillation: Secondary | ICD-10-CM | POA: Insufficient documentation

## 2020-07-17 DIAGNOSIS — Z87891 Personal history of nicotine dependence: Secondary | ICD-10-CM | POA: Insufficient documentation

## 2020-07-17 DIAGNOSIS — D631 Anemia in chronic kidney disease: Secondary | ICD-10-CM | POA: Diagnosis not present

## 2020-07-17 DIAGNOSIS — D539 Nutritional anemia, unspecified: Secondary | ICD-10-CM

## 2020-07-17 DIAGNOSIS — D509 Iron deficiency anemia, unspecified: Secondary | ICD-10-CM | POA: Diagnosis not present

## 2020-07-17 DIAGNOSIS — R0602 Shortness of breath: Secondary | ICD-10-CM | POA: Insufficient documentation

## 2020-07-17 DIAGNOSIS — I132 Hypertensive heart and chronic kidney disease with heart failure and with stage 5 chronic kidney disease, or end stage renal disease: Secondary | ICD-10-CM | POA: Diagnosis not present

## 2020-07-22 ENCOUNTER — Telehealth: Payer: Self-pay

## 2020-07-22 ENCOUNTER — Other Ambulatory Visit (HOSPITAL_COMMUNITY): Payer: Self-pay | Admitting: Surgery

## 2020-07-22 DIAGNOSIS — D539 Nutritional anemia, unspecified: Secondary | ICD-10-CM

## 2020-07-22 DIAGNOSIS — R69 Illness, unspecified: Secondary | ICD-10-CM | POA: Diagnosis not present

## 2020-07-22 DIAGNOSIS — N179 Acute kidney failure, unspecified: Secondary | ICD-10-CM

## 2020-07-22 DIAGNOSIS — N183 Chronic kidney disease, stage 3 unspecified: Secondary | ICD-10-CM

## 2020-07-22 NOTE — Telephone Encounter (Signed)
Script written out and on door if approved. Will fax once signed. Pt is aware. Please advise. Thank you.

## 2020-07-22 NOTE — Telephone Encounter (Signed)
done

## 2020-07-22 NOTE — Telephone Encounter (Signed)
Pt needs refill on One Touch Test Strips he is completley out pt needs RX sent to [facility] on [address] pt said has to be RX in order for insurance to pay for it. They have RX test strips none over the counter   Pt call back [phone]

## 2020-07-23 ENCOUNTER — Other Ambulatory Visit: Payer: Self-pay

## 2020-07-23 ENCOUNTER — Inpatient Hospital Stay (HOSPITAL_COMMUNITY): Payer: Medicare HMO

## 2020-07-23 VITALS — BP 141/59 | HR 65 | Temp 96.8°F | Resp 18

## 2020-07-23 DIAGNOSIS — N183 Chronic kidney disease, stage 3 unspecified: Secondary | ICD-10-CM

## 2020-07-23 DIAGNOSIS — D539 Nutritional anemia, unspecified: Secondary | ICD-10-CM

## 2020-07-23 DIAGNOSIS — I132 Hypertensive heart and chronic kidney disease with heart failure and with stage 5 chronic kidney disease, or end stage renal disease: Secondary | ICD-10-CM | POA: Diagnosis not present

## 2020-07-23 DIAGNOSIS — N179 Acute kidney failure, unspecified: Secondary | ICD-10-CM

## 2020-07-23 DIAGNOSIS — D508 Other iron deficiency anemias: Secondary | ICD-10-CM

## 2020-07-23 LAB — CBC WITH DIFFERENTIAL/PLATELET
Abs Immature Granulocytes: 0.84 10*3/uL — ABNORMAL HIGH (ref 0.00–0.07)
Basophils Absolute: 0.1 10*3/uL (ref 0.0–0.1)
Basophils Relative: 0 %
Eosinophils Absolute: 0 10*3/uL (ref 0.0–0.5)
Eosinophils Relative: 0 %
HCT: 31.9 % — ABNORMAL LOW (ref 39.0–52.0)
Hemoglobin: 10.2 g/dL — ABNORMAL LOW (ref 13.0–17.0)
Immature Granulocytes: 5 %
Lymphocytes Relative: 4 %
Lymphs Abs: 0.7 10*3/uL (ref 0.7–4.0)
MCH: 34.3 pg — ABNORMAL HIGH (ref 26.0–34.0)
MCHC: 32 g/dL (ref 30.0–36.0)
MCV: 107.4 fL — ABNORMAL HIGH (ref 80.0–100.0)
Monocytes Absolute: 2.9 10*3/uL — ABNORMAL HIGH (ref 0.1–1.0)
Monocytes Relative: 15 %
Neutro Abs: 14.2 10*3/uL — ABNORMAL HIGH (ref 1.7–7.7)
Neutrophils Relative %: 76 %
Platelets: 142 10*3/uL — ABNORMAL LOW (ref 150–400)
RBC: 2.97 MIL/uL — ABNORMAL LOW (ref 4.22–5.81)
RDW: 23.3 % — ABNORMAL HIGH (ref 11.5–15.5)
WBC: 18.7 10*3/uL — ABNORMAL HIGH (ref 4.0–10.5)
nRBC: 0 % (ref 0.0–0.2)

## 2020-07-23 MED ORDER — EPOETIN ALFA-EPBX 40000 UNIT/ML IJ SOLN
40000.0000 [IU] | Freq: Once | INTRAMUSCULAR | Status: AC
  Administered 2020-07-23: 40000 [IU] via SUBCUTANEOUS
  Filled 2020-07-23: qty 1

## 2020-07-23 NOTE — Progress Notes (Signed)
Patient tolerated Retacrit injection with no complaints voiced.  Site clean and dry with no bruising or swelling noted.  No complaints of pain.  Discharged with vital signs stable and no signs or symptoms of distress noted.  

## 2020-07-30 ENCOUNTER — Inpatient Hospital Stay (HOSPITAL_COMMUNITY): Payer: Medicare HMO | Attending: Hematology

## 2020-07-30 ENCOUNTER — Inpatient Hospital Stay (HOSPITAL_COMMUNITY): Payer: Medicare HMO

## 2020-07-30 ENCOUNTER — Other Ambulatory Visit: Payer: Self-pay

## 2020-07-30 VITALS — BP 123/56 | HR 78 | Temp 97.0°F | Resp 18

## 2020-07-30 DIAGNOSIS — I480 Paroxysmal atrial fibrillation: Secondary | ICD-10-CM | POA: Diagnosis not present

## 2020-07-30 DIAGNOSIS — R69 Illness, unspecified: Secondary | ICD-10-CM | POA: Diagnosis not present

## 2020-07-30 DIAGNOSIS — Z79899 Other long term (current) drug therapy: Secondary | ICD-10-CM | POA: Diagnosis not present

## 2020-07-30 DIAGNOSIS — F329 Major depressive disorder, single episode, unspecified: Secondary | ICD-10-CM | POA: Insufficient documentation

## 2020-07-30 DIAGNOSIS — E785 Hyperlipidemia, unspecified: Secondary | ICD-10-CM | POA: Insufficient documentation

## 2020-07-30 DIAGNOSIS — Z87442 Personal history of urinary calculi: Secondary | ICD-10-CM | POA: Insufficient documentation

## 2020-07-30 DIAGNOSIS — K760 Fatty (change of) liver, not elsewhere classified: Secondary | ICD-10-CM | POA: Diagnosis not present

## 2020-07-30 DIAGNOSIS — J45909 Unspecified asthma, uncomplicated: Secondary | ICD-10-CM | POA: Diagnosis not present

## 2020-07-30 DIAGNOSIS — I130 Hypertensive heart and chronic kidney disease with heart failure and stage 1 through stage 4 chronic kidney disease, or unspecified chronic kidney disease: Secondary | ICD-10-CM | POA: Diagnosis not present

## 2020-07-30 DIAGNOSIS — R369 Urethral discharge, unspecified: Secondary | ICD-10-CM | POA: Insufficient documentation

## 2020-07-30 DIAGNOSIS — I2510 Atherosclerotic heart disease of native coronary artery without angina pectoris: Secondary | ICD-10-CM | POA: Insufficient documentation

## 2020-07-30 DIAGNOSIS — M129 Arthropathy, unspecified: Secondary | ICD-10-CM | POA: Insufficient documentation

## 2020-07-30 DIAGNOSIS — N183 Chronic kidney disease, stage 3 unspecified: Secondary | ICD-10-CM

## 2020-07-30 DIAGNOSIS — D539 Nutritional anemia, unspecified: Secondary | ICD-10-CM

## 2020-07-30 DIAGNOSIS — I5030 Unspecified diastolic (congestive) heart failure: Secondary | ICD-10-CM | POA: Diagnosis not present

## 2020-07-30 DIAGNOSIS — Z794 Long term (current) use of insulin: Secondary | ICD-10-CM | POA: Insufficient documentation

## 2020-07-30 DIAGNOSIS — N189 Chronic kidney disease, unspecified: Secondary | ICD-10-CM | POA: Diagnosis not present

## 2020-07-30 DIAGNOSIS — Z87891 Personal history of nicotine dependence: Secondary | ICD-10-CM | POA: Insufficient documentation

## 2020-07-30 DIAGNOSIS — R0602 Shortness of breath: Secondary | ICD-10-CM | POA: Diagnosis not present

## 2020-07-30 DIAGNOSIS — N179 Acute kidney failure, unspecified: Secondary | ICD-10-CM

## 2020-07-30 DIAGNOSIS — E119 Type 2 diabetes mellitus without complications: Secondary | ICD-10-CM | POA: Diagnosis not present

## 2020-07-30 DIAGNOSIS — I129 Hypertensive chronic kidney disease with stage 1 through stage 4 chronic kidney disease, or unspecified chronic kidney disease: Secondary | ICD-10-CM | POA: Diagnosis present

## 2020-07-30 DIAGNOSIS — D631 Anemia in chronic kidney disease: Secondary | ICD-10-CM | POA: Diagnosis not present

## 2020-07-30 DIAGNOSIS — I4891 Unspecified atrial fibrillation: Secondary | ICD-10-CM | POA: Insufficient documentation

## 2020-07-30 DIAGNOSIS — J9601 Acute respiratory failure with hypoxia: Secondary | ICD-10-CM | POA: Diagnosis not present

## 2020-07-30 DIAGNOSIS — E1122 Type 2 diabetes mellitus with diabetic chronic kidney disease: Secondary | ICD-10-CM | POA: Diagnosis not present

## 2020-07-30 DIAGNOSIS — D508 Other iron deficiency anemias: Secondary | ICD-10-CM

## 2020-07-30 LAB — CBC WITH DIFFERENTIAL/PLATELET
Band Neutrophils: 3 %
Basophils Absolute: 0 10*3/uL (ref 0.0–0.1)
Basophils Relative: 0 %
Eosinophils Absolute: 0 10*3/uL (ref 0.0–0.5)
Eosinophils Relative: 0 %
HCT: 30 % — ABNORMAL LOW (ref 39.0–52.0)
Hemoglobin: 9.7 g/dL — ABNORMAL LOW (ref 13.0–17.0)
Lymphocytes Relative: 8 %
Lymphs Abs: 0.7 10*3/uL (ref 0.7–4.0)
MCH: 35 pg — ABNORMAL HIGH (ref 26.0–34.0)
MCHC: 32.3 g/dL (ref 30.0–36.0)
MCV: 108.3 fL — ABNORMAL HIGH (ref 80.0–100.0)
Metamyelocytes Relative: 1 %
Monocytes Absolute: 0.7 10*3/uL (ref 0.1–1.0)
Monocytes Relative: 8 %
Myelocytes: 3 %
Neutro Abs: 6.8 10*3/uL (ref 1.7–7.7)
Neutrophils Relative %: 77 %
Platelets: 148 10*3/uL — ABNORMAL LOW (ref 150–400)
RBC: 2.77 MIL/uL — ABNORMAL LOW (ref 4.22–5.81)
RDW: 22.6 % — ABNORMAL HIGH (ref 11.5–15.5)
WBC: 8.5 10*3/uL (ref 4.0–10.5)
nRBC: 0 % (ref 0.0–0.2)

## 2020-07-30 MED ORDER — EPOETIN ALFA-EPBX 40000 UNIT/ML IJ SOLN
40000.0000 [IU] | Freq: Once | INTRAMUSCULAR | Status: AC
  Administered 2020-07-30: 40000 [IU] via SUBCUTANEOUS
  Filled 2020-07-30: qty 1

## 2020-07-30 NOTE — Progress Notes (Signed)
Patient tolerated Retacrit injection with no complaints voiced.  Site clean and dry with no bruising or swelling noted.  No complaints of pain.  Discharged with vital signs stable and no signs or symptoms of distress noted.  

## 2020-07-31 ENCOUNTER — Telehealth: Payer: Self-pay | Admitting: *Deleted

## 2020-07-31 NOTE — Telephone Encounter (Signed)
Verbal orders given on Jack Mejia's vm.

## 2020-07-31 NOTE — Telephone Encounter (Signed)
Jack Mejia from [facility] phone number [phone] calling to get verbal orders for plan of care. Will be seeing him 1 for week one, 2 week 2, then one 1 every 2 weeks. Also his orders had he was dependant on oxygen but pt told Joey he does not use oxygen so wanted to clarify if pt should be on oxygen.

## 2020-07-31 NOTE — Telephone Encounter (Signed)
Yes pls give verbal orders.   If they can check his oxygen sat while at home with rest, we can dc the oxygen if it's normal.  Last office visit was normal at 99% on RA and no oxygen.   Thx. Dr. Lovena Le

## 2020-08-01 ENCOUNTER — Telehealth: Payer: Self-pay | Admitting: Internal Medicine

## 2020-08-01 ENCOUNTER — Telehealth: Payer: Self-pay | Admitting: Family Medicine

## 2020-08-01 DIAGNOSIS — D631 Anemia in chronic kidney disease: Secondary | ICD-10-CM | POA: Diagnosis not present

## 2020-08-01 DIAGNOSIS — E785 Hyperlipidemia, unspecified: Secondary | ICD-10-CM | POA: Diagnosis not present

## 2020-08-01 DIAGNOSIS — I2510 Atherosclerotic heart disease of native coronary artery without angina pectoris: Secondary | ICD-10-CM | POA: Diagnosis not present

## 2020-08-01 DIAGNOSIS — I130 Hypertensive heart and chronic kidney disease with heart failure and stage 1 through stage 4 chronic kidney disease, or unspecified chronic kidney disease: Secondary | ICD-10-CM | POA: Diagnosis not present

## 2020-08-01 DIAGNOSIS — J45909 Unspecified asthma, uncomplicated: Secondary | ICD-10-CM | POA: Diagnosis not present

## 2020-08-01 DIAGNOSIS — N189 Chronic kidney disease, unspecified: Secondary | ICD-10-CM | POA: Diagnosis not present

## 2020-08-01 DIAGNOSIS — I480 Paroxysmal atrial fibrillation: Secondary | ICD-10-CM | POA: Diagnosis not present

## 2020-08-01 DIAGNOSIS — I5030 Unspecified diastolic (congestive) heart failure: Secondary | ICD-10-CM | POA: Diagnosis not present

## 2020-08-01 DIAGNOSIS — E1122 Type 2 diabetes mellitus with diabetic chronic kidney disease: Secondary | ICD-10-CM | POA: Diagnosis not present

## 2020-08-01 DIAGNOSIS — K760 Fatty (change of) liver, not elsewhere classified: Secondary | ICD-10-CM | POA: Diagnosis not present

## 2020-08-01 NOTE — Telephone Encounter (Signed)
Companion called back and spoke with Jack Mejia who provided the information regarding the CXR in my note.  Nothing further needed.

## 2020-08-01 NOTE — Telephone Encounter (Signed)
ATC companion x1.  Left VM to return call.  CXR is in the computer for AP, all he has to do is go by and have it done prior to his appointment on Monday.  Will await his return call.

## 2020-08-01 NOTE — Telephone Encounter (Signed)
Jack Mejia from home health needing verbal orders for OT once a week for seven weeks . Jack [phone]

## 2020-08-01 NOTE — Telephone Encounter (Signed)
Verbal orders given on mallory's vm.

## 2020-08-01 NOTE — Telephone Encounter (Signed)
Yes, pls give orders. Thx. Dr. Darene Lamer

## 2020-08-04 ENCOUNTER — Other Ambulatory Visit: Payer: Self-pay | Admitting: Adult Health

## 2020-08-05 ENCOUNTER — Ambulatory Visit (HOSPITAL_COMMUNITY)
Admission: RE | Admit: 2020-08-05 | Discharge: 2020-08-05 | Disposition: A | Discharge status: Home / Self Care | Payer: Medicare HMO | Source: Ambulatory Visit | Attending: Pulmonary Disease | Admitting: Pulmonary Disease

## 2020-08-05 ENCOUNTER — Ambulatory Visit (INDEPENDENT_AMBULATORY_CARE_PROVIDER_SITE_OTHER): Payer: Medicare HMO | Admitting: Internal Medicine

## 2020-08-05 ENCOUNTER — Other Ambulatory Visit: Payer: Self-pay

## 2020-08-05 ENCOUNTER — Encounter: Payer: Self-pay | Admitting: Internal Medicine

## 2020-08-05 DIAGNOSIS — J189 Pneumonia, unspecified organism: Secondary | ICD-10-CM

## 2020-08-05 DIAGNOSIS — D631 Anemia in chronic kidney disease: Secondary | ICD-10-CM | POA: Diagnosis not present

## 2020-08-05 DIAGNOSIS — E1122 Type 2 diabetes mellitus with diabetic chronic kidney disease: Secondary | ICD-10-CM | POA: Diagnosis not present

## 2020-08-05 DIAGNOSIS — J984 Other disorders of lung: Secondary | ICD-10-CM

## 2020-08-05 DIAGNOSIS — J45909 Unspecified asthma, uncomplicated: Secondary | ICD-10-CM | POA: Diagnosis not present

## 2020-08-05 DIAGNOSIS — I5030 Unspecified diastolic (congestive) heart failure: Secondary | ICD-10-CM | POA: Diagnosis not present

## 2020-08-05 DIAGNOSIS — N189 Chronic kidney disease, unspecified: Secondary | ICD-10-CM | POA: Diagnosis not present

## 2020-08-05 DIAGNOSIS — I480 Paroxysmal atrial fibrillation: Secondary | ICD-10-CM | POA: Diagnosis not present

## 2020-08-05 DIAGNOSIS — J9601 Acute respiratory failure with hypoxia: Secondary | ICD-10-CM

## 2020-08-05 DIAGNOSIS — K760 Fatty (change of) liver, not elsewhere classified: Secondary | ICD-10-CM | POA: Diagnosis not present

## 2020-08-05 DIAGNOSIS — I2510 Atherosclerotic heart disease of native coronary artery without angina pectoris: Secondary | ICD-10-CM | POA: Diagnosis not present

## 2020-08-05 DIAGNOSIS — E785 Hyperlipidemia, unspecified: Secondary | ICD-10-CM | POA: Diagnosis not present

## 2020-08-05 DIAGNOSIS — I130 Hypertensive heart and chronic kidney disease with heart failure and stage 1 through stage 4 chronic kidney disease, or unspecified chronic kidney disease: Secondary | ICD-10-CM | POA: Diagnosis not present

## 2020-08-05 NOTE — Patient Instructions (Addendum)
We will check your 02 level walking today   Make sure you check your oxygen saturation at your highest level of activity to be sure it stays over 90% and keep track of it at least once a week, more often if breathing getting worse, and let me know if losing ground.   When you go to lab tomorrow at [facility] we will be obtaining also a Sed Rate and call the results  Use over the counter ear wax kit and follow up with your ear doctor as planned    Pulmonary follow up is as needed

## 2020-08-05 NOTE — Assessment & Plan Note (Addendum)
11/29-12/06/2020 sepsis picture with acute respiratory failure with hypoxia possibly related to amiodarone induced pneumonitis.  Clinically CAP and component of acute on chronic CHF could not be ruled out. Profound hypoxia necessitated BiPAP and O2 flow up to 15 L/min. Significant response to parenteral steroids which were transitioned to a prednisone wean.  Aggressive treatment of CHF and parenteral antibiotics were also prescribed. - 06/28/20 stopped amiodarone, started steroids  - last day of prednisone rx 08/03/20 -  08/05/2020   Walked RA  approx   600 ft  @ slow pace  stopped due to  End of study with sats 97% and clear cxr     - ESR 08/05/2020 >>>     Acute respiratory failure strongly suggestive of amiodarone toxicity and there is a risk he will relapse off steroids so advised on tracking 02 sats with exercise and calling if there is a decline over the next few weeks and if not then pulmonary f/u is prn

## 2020-08-05 NOTE — Assessment & Plan Note (Signed)
Clinical dx with onset of symptoms mid Oct 2021 - 06/28/20 stopped amiodarone, started steroids  - last day of prednisone rx 08/03/20 -  08/05/2020   Walked RA  approx   600 ft  @ slow pace  stopped due to  End of study with sats 97% and clear cxr     - ESR 08/05/2020 >>>    Advised very strongly favor this dx over others in the ddx and should not take amiodarone again > cards f/u pending.           Each maintenance medication was reviewed in detail including emphasizing most importantly the difference between maintenance and prns and under what circumstances the prns are to be triggered using an action plan format where appropriate.  Total time for H and P, chart review, counseling, reviewing  directly observing portions of ambulatory 02 saturation study/  and generating customized AVS unique to this post hosp office visit / same day charting = 39 min

## 2020-08-05 NOTE — Progress Notes (Signed)
Jack Mejia, male    DOB: [birth year], 75 y.o.   MRN: [id number]   Brief patient profile:    Admit date: 06/24/2020 Discharge date: 07/10/2020  Admitted From: Home Disposition:  SNF  Recommendations for Outpatient Follow-up:  1. Follow up with PCP in 1-2 weeks 2. Please obtain BMP/CBC in one week 3. Follow up Dr. Delton Coombes 07/17/20 at [facility] at 9:30AM    Discharge Condition: Stable CODE STATUS: DNR Diet recommendation: Heart Healthy / Carb Modified   Brief/Interim Summary: 75 y.o.year old malewith a history of paroxysmal atrial fibrillation on apixaban, CKD stage III, hypertension, diastolic CHF, hyperlipidemia, diabetes mellitus type 2,macrocytic anemia following with hematology s/p iron injection and receiving Retacrit, and adenomatous colon polyps in 2011who was admitted 06/24/20 with sepsis and acuterespiratory failurein the setting of pneumonia and pulmonary edema withacute on chronic diastolic CHF. 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 Melvyn Novas and Dr Halford Chessman -Stopped amiodarone -Treated with IV Solu-Medrol started 06/28/2020 through 07/02/2020,  -Transitionedto prednisone 40 mg daily on 12/8 with plans to decrease by 10 mg every 7 days -Oxygen has been weaned down to less than 2 L/min from 15 L last week No longer requiring BiPAP -Wean oxygen as tolerated for saturation greater 92% BC x 2 11/29 >>> RESP viral Panel PCR 11/29 neg MRSA PCR 11/30 Neg Strep urinary ag 11/30  neg  Legionella urinary ag 11/30 >>>neg -Overall significant improvement since starting steroids on 06/28/2020 -Repeat chest x-ray on 07/02/2020 shows improvement -Follow-up appointment with pulmonology has been scheduled   Acute on chronic diastolic CHF -Volume overload status-worsened with transfusion of PRBC -Continue Bumex twice daily>>transition back to home torsemide, but increase to bid -Continue on fluid restriction -Plan will be to discharge on p.o. torsemide as patient has tolerated this in the past -Daily weights -Accurate I's and O's -05/08/2020 echo EF 65 to 70%, no WMA, grade 1 DD, trivial MR, moderate AAS --BNP is elevated -Currently, volume status improving  DM2--uncontrolled with hyperglycemia -last A1c 8.3 reflecting uncontrolled DM with hyperglycemia -NovoLog sliding scale -Holding glipizide--restart after d/c -Blood sugars uncontrolled with hyperglycemia, likely related to steroids -Adjusting Levemir and NovoLog with meals -may need to decrease/stop levemir and novolog as steroids are weaned  CAP/Lobar pneumonia -COVID-19 PCR negative -Patient completed ceftriaxone and doxycycline -Continue bronchodilators and mucolytics -Repeat chest x-ray from 07/02/2020 noted  Severe sepsis -Present on admission -Presented with leukocytosis and tachypnea -Secondary to pneumonia as above -Lactic acid peaked 3.4 -Completed antibiotics as above -Sepsis pathophysiology appears to have resolved  Acute on chronic renal failure CKD stage IIIb -Baseline creatinine 2.0-2.2 -Presented with serum creatinine 2.51 -Monitor closely with diuresis ----renally adjust medications, avoid nephrotoxic agents / dehydration / hypotension  Hematochezia/ABLA -GI consulted -baseline Hgb 9 -presented with Hgb 7.5 -Received a total of 2 units of PRBC this admission -Follow-up hemoglobin improved to 9 and has been fluctuating between 8 and 9 -GI consult appreciated -Patient's  cardiopulmonary status precludes endoluminal evaluation -Advance to GI soft diet  Paroxysmal Atrial Fibrillation -holding apixaban due to hematochezia -Discussed with GI, Dr. Jenetta Downer who agreed that it would be reasonable to hold anticoagulation until he can  be seen in follow-up by GI and further work-up including endoluminal evaluation can be performed -Close follow-up with GI will be scheduled -Continue Cardizem CD -Stopped amiodarone due to concerns about possible amiodarone induced pneumonitis -start ASA 81 mg in lieu of apixaban  Essential Hypertension--stable -Continue diltiazem CD -Continue metoprolol heart rate  Hyperlipidemia -continue statin  Hypokalemia/Hypomagnesemia--- replace and recheck, electrolytes abnormalities may persist due to high-dose diuretic use  Leukocytosis -Chart reviewed and patient has seen hem/onc in the past for macrocytic anemia -He had a bone marrow biopsy done in 03/2020 that showed hypercellular marrow with trilineage dysplasia -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 Graylin Shiver Dr. Burr Medico, oncologywho felt it was reasonable to discharge the patient in follow-up further work-up as an outpatient -He will be arranged to be seen in the oncology clinic--07/17/20 with Dr. Delton Coombes at 9:30 AM  Social/ethics--plan of care, advanced directives and goals of care discussed with patient and patient's 2 daughters [name] and [name] -Palliative care consult appreciated patient is a DNR/DNI with full scope of treatment    History of Present Illness  08/05/2020  Pulmonary/ 1st office eval/ Melvyn Novas / [facility] Office  Chief Complaint  Patient presents with  . Follow-up    Fatigued  Dyspnea:  Gradually improving, some out door walking up to 5 minutes with 02  sats 90-98% Cough: none  Sleep: flat in bed / one pillow SABA use: none Last prednisone was  08/03/20   No obvious day to day or daytime variability or assoc excess/ purulent sputum or mucus plugs or hemoptysis or cp or chest tightness, subjective wheeze or overt sinus or hb symptoms.   Sleeping  without nocturnal  or early am exacerbation  of respiratory  c/o's or need for noct saba. Also denies any obvious fluctuation of symptoms with weather or environmental changes or other aggravating or alleviating factors except as outlined above   No unusual exposure hx or h/o childhood pna/ asthma or knowledge of premature birth.  Current Allergies, Complete Past Medical History, Past Surgical History, Family History, and Social History were reviewed in [employer] record.  ROS  The following are not active complaints unless bolded Hoarseness, sore throat, dysphagia, dental problems, itching, sneezing,  nasal congestion or discharge of excess mucus or purulent secretions, ear ache,   fever, chills, sweats, unintended wt loss or wt gain, classically pleuritic or exertional cp,  orthopnea pnd or arm/hand swelling  or leg swelling, presyncope, palpitations, abdominal pain, anorexia, nausea, vomiting, diarrhea  or change in bowel habits or change in bladder habits, change in stools or change in urine, dysuria, hematuria,  rash, arthralgias, visual complaints, headache, numbness, weakness or ataxia or problems with walking or coordination,  change in mood or  memory.           Past Medical History:  Diagnosis Date  . Arthritis   . Asthma   . Atrial fibrillation ([place])    a. s/p DCCV in 03/2019  . CAD (coronary artery disease) 06/29/2019  . Cramps of left lower extremity   . Depression   . Diabetes mellitus    type 2 for 7-8 yrs  . Dyspnea   . Elevated liver enzymes   . Fatty liver   . History of kidney stones   . Hyperlipidemia   . Hypertension   . Renal insufficiency   .  Vertigo     Outpatient Medications Prior to Visit  Medication Sig Dispense Refill  . acetaminophen (TYLENOL) 650 MG CR tablet Take 650 mg by mouth every 8 (eight) hours as needed for pain.    [name] allopurinol (ZYLOPRIM) 100 MG tablet Take 100 mg by mouth daily.    . Amino Acids-Protein Hydrolys (FEEDING SUPPLEMENT, PRO-STAT SUGAR FREE 64,) LIQD Take 30 mLs by mouth 3 (three) times daily with meals. For albumin 2.6    . atorvastatin (LIPITOR) 20 MG tablet Take 1 tablet (20 mg total) by mouth daily. 30 tablet 0  . Balsam [place]-Castor Oil (VENELEX) OINT Apply 1 application topically in the morning, at noon, and at bedtime. Every shift day, evening, and night. Apply to sacrum and bilateral buttocks    . BD PEN NEEDLE NANO 2ND GEN 32G X 4 MM MISC     . diltiazem (CARDIZEM CD) 360 MG 24 hr capsule Take 1 capsule (360 mg total) by mouth daily. 30 capsule 0  . empagliflozin (JARDIANCE) 10 MG TABS tablet Take 1 tablet (10 mg total) by mouth daily before breakfast. 30 tablet 0  . insulin aspart (NOVOLOG) 100 UNIT/ML injection Inject 15 Units into the skin 3 (three) times daily with meals. 15 mL 0  . LEVEMIR FLEXTOUCH 100 UNIT/ML FlexPen Inject into the skin.    . metoprolol tartrate (LOPRESSOR) 25 MG tablet Take 1 tablet (25 mg total) by mouth 2 (two) times daily. 30 tablet 0  . NON FORMULARY Diet: [place], NAS; thin liquids; 1200cc fluid restriction    . ONETOUCH ULTRA test strip 1 each 2 (two) times daily.    . potassium chloride (KLOR-CON) 10 MEQ tablet Take 1 tablet (10 mEq total) by mouth daily. 30 tablet 1  . sertraline (ZOLOFT) 50 MG tablet Take 1/2 tablet p.o. qhs for 1 wk, then increase to 1 tab daily. 30 tablet 3  . torsemide (DEMADEX) 20 MG tablet Take 1 tablet (20 mg total) by mouth 2 (two) times daily. 60 tablet 0  . Vitamin D, Ergocalciferol, (DRISDOL) 1.25 MG (50000 UNIT) CAPS capsule Take 1 capsule (50,000 Units total) by mouth every 7 (seven) days. 4 capsule 0  . OXYGEN Inhale 2 L into the  lungs continuous. (Patient not taking: Reported on 08/05/2020)    . predniSONE (DELTASONE) 10 MG tablet Take 1 tablet (10 mg total) by mouth daily with breakfast. Take 30 mg from 07-16-20 through 07-17-20; then take 20 mg daily from 07-18-20 through 07-24-20 then take 10 mg daily from 07-25-20 through 07-31-20. (Patient not taking: Reported on 08/05/2020) 26 tablet 0   No facility-administered medications prior to visit.     Objective:     BP (!) 144/68 (BP Location: Right Arm, Cuff Size: Normal)   Pulse 66   Temp (!) 97.4 F (36.3 C) (Temporal)   Ht $R'5\' 10"'Yp$  (1.778 m)   Wt 187 lb 6.4 oz (85 kg)   SpO2 97% Comment: Room air  BMI 26.89 kg/m   SpO2: 97 % (Room air)   Somber am wm nad    HEENT : pt wearing mask not removed for exam due to covid -19 concerns.  Ears full of hard wax bilaterally    NECK :  without JVD/Nodes/TM/ nl carotid upstrokes bilaterally   LUNGS: no acc muscle use,  Nl contour chest which is clear to A and P bilaterally without cough on insp or exp maneuvers   CV:  RRR  no s3 or murmur or increase in P2,  and no edema   ABD:  soft and nontender with nl inspiratory excursion in the supine position. No bruits or organomegaly appreciated, bowel sounds nl  MS:  Walks with "suction cane" ext warm without deformities, calf tenderness, cyanosis or clubbing No obvious joint restrictions   SKIN: warm and dry without lesions    NEURO:  alert, approp, nl sensorium with  no motor or cerebellar deficits apparent.     CXR PA and Lateral:   08/05/2020 :    I personally reviewed images and agree with radiology impression as follows:   1.  No acute cardiopulmonary disease. 2.  Overall improved aeration of the lungs suggests resolved atypical infection.    Lab Results  Component Value Date   ESRSEDRATE 128 (H) 07/02/2020   ESRSEDRATE 130 (H) 06/27/2020   ESRSEDRATE 86 (H) 09/07/2018        Assessment   No problem-specific Assessment & Plan notes found for this  encounter.     Christinia Gully, MD 08/05/2020

## 2020-08-06 ENCOUNTER — Other Ambulatory Visit: Payer: Self-pay

## 2020-08-06 ENCOUNTER — Inpatient Hospital Stay (HOSPITAL_COMMUNITY): Payer: Medicare HMO

## 2020-08-06 ENCOUNTER — Other Ambulatory Visit (HOSPITAL_COMMUNITY)
Admission: RE | Admit: 2020-08-06 | Discharge: 2020-08-06 | Disposition: A | Discharge status: Home / Self Care | Payer: Medicare HMO | Source: Ambulatory Visit | Attending: Internal Medicine | Admitting: Internal Medicine

## 2020-08-06 VITALS — BP 141/68 | HR 62 | Temp 96.9°F | Resp 18

## 2020-08-06 DIAGNOSIS — R369 Urethral discharge, unspecified: Secondary | ICD-10-CM | POA: Diagnosis not present

## 2020-08-06 DIAGNOSIS — D539 Nutritional anemia, unspecified: Secondary | ICD-10-CM | POA: Diagnosis not present

## 2020-08-06 DIAGNOSIS — Z79899 Other long term (current) drug therapy: Secondary | ICD-10-CM | POA: Diagnosis not present

## 2020-08-06 DIAGNOSIS — J45909 Unspecified asthma, uncomplicated: Secondary | ICD-10-CM | POA: Diagnosis not present

## 2020-08-06 DIAGNOSIS — D508 Other iron deficiency anemias: Secondary | ICD-10-CM

## 2020-08-06 DIAGNOSIS — I129 Hypertensive chronic kidney disease with stage 1 through stage 4 chronic kidney disease, or unspecified chronic kidney disease: Secondary | ICD-10-CM | POA: Diagnosis not present

## 2020-08-06 DIAGNOSIS — I4891 Unspecified atrial fibrillation: Secondary | ICD-10-CM | POA: Diagnosis not present

## 2020-08-06 DIAGNOSIS — I5033 Acute on chronic diastolic (congestive) heart failure: Secondary | ICD-10-CM | POA: Diagnosis not present

## 2020-08-06 DIAGNOSIS — D509 Iron deficiency anemia, unspecified: Secondary | ICD-10-CM

## 2020-08-06 DIAGNOSIS — I2510 Atherosclerotic heart disease of native coronary artery without angina pectoris: Secondary | ICD-10-CM | POA: Diagnosis not present

## 2020-08-06 DIAGNOSIS — R69 Illness, unspecified: Secondary | ICD-10-CM | POA: Diagnosis not present

## 2020-08-06 DIAGNOSIS — N183 Chronic kidney disease, stage 3 unspecified: Secondary | ICD-10-CM

## 2020-08-06 DIAGNOSIS — J9601 Acute respiratory failure with hypoxia: Secondary | ICD-10-CM | POA: Diagnosis not present

## 2020-08-06 DIAGNOSIS — M129 Arthropathy, unspecified: Secondary | ICD-10-CM | POA: Diagnosis not present

## 2020-08-06 DIAGNOSIS — D631 Anemia in chronic kidney disease: Secondary | ICD-10-CM | POA: Diagnosis not present

## 2020-08-06 LAB — IRON AND TIBC
Iron: 54 ug/dL (ref 45–182)
Saturation Ratios: 22 % (ref 17.9–39.5)
TIBC: 250 ug/dL (ref 250–450)
UIBC: 196 ug/dL

## 2020-08-06 LAB — COMPREHENSIVE METABOLIC PANEL
ALT: 40 U/L (ref 0–44)
AST: 25 U/L (ref 15–41)
Albumin: 3.3 g/dL — ABNORMAL LOW (ref 3.5–5.0)
Alkaline Phosphatase: 146 U/L — ABNORMAL HIGH (ref 38–126)
Anion gap: 12 (ref 5–15)
BUN: 28 mg/dL — ABNORMAL HIGH (ref 8–23)
CO2: 24 mmol/L (ref 22–32)
Calcium: 8.4 mg/dL — ABNORMAL LOW (ref 8.9–10.3)
Chloride: 96 mmol/L — ABNORMAL LOW (ref 98–111)
Creatinine, Ser: 1.75 mg/dL — ABNORMAL HIGH (ref 0.61–1.24)
GFR, Estimated: 40 mL/min — ABNORMAL LOW (ref >60)
Glucose, Bld: 348 mg/dL — ABNORMAL HIGH (ref 70–99)
Potassium: 4.1 mmol/L (ref 3.5–5.1)
Sodium: 132 mmol/L — ABNORMAL LOW (ref 135–145)
Total Bilirubin: 0.9 mg/dL (ref 0.3–1.2)
Total Protein: 6.6 g/dL (ref 6.5–8.1)

## 2020-08-06 LAB — CBC WITH DIFFERENTIAL/PLATELET
Band Neutrophils: 7 %
Basophils Absolute: 0.1 10*3/uL (ref 0.0–0.1)
Basophils Relative: 1 %
Eosinophils Absolute: 0.1 10*3/uL (ref 0.0–0.5)
Eosinophils Relative: 1 %
HCT: 31.4 % — ABNORMAL LOW (ref 39.0–52.0)
Hemoglobin: 10.1 g/dL — ABNORMAL LOW (ref 13.0–17.0)
Lymphocytes Relative: 15 %
Lymphs Abs: 1.2 10*3/uL (ref 0.7–4.0)
MCH: 35.1 pg — ABNORMAL HIGH (ref 26.0–34.0)
MCHC: 32.2 g/dL (ref 30.0–36.0)
MCV: 109 fL — ABNORMAL HIGH (ref 80.0–100.0)
Monocytes Absolute: 1.3 10*3/uL — ABNORMAL HIGH (ref 0.1–1.0)
Monocytes Relative: 16 %
Neutro Abs: 5.6 10*3/uL (ref 1.7–7.7)
Neutrophils Relative %: 60 %
Platelets: 152 10*3/uL (ref 150–400)
RBC: 2.88 MIL/uL — ABNORMAL LOW (ref 4.22–5.81)
RDW: 22.4 % — ABNORMAL HIGH (ref 11.5–15.5)
WBC: 8.3 10*3/uL (ref 4.0–10.5)
nRBC: 0 % (ref 0.0–0.2)

## 2020-08-06 LAB — FERRITIN: Ferritin: 341 ng/mL — ABNORMAL HIGH (ref 24–336)

## 2020-08-06 LAB — BRAIN NATRIURETIC PEPTIDE: B Natriuretic Peptide: 170 pg/mL — ABNORMAL HIGH (ref 0.0–100.0)

## 2020-08-06 LAB — SEDIMENTATION RATE: Sed Rate: 77 mm/hr — ABNORMAL HIGH (ref 0–16)

## 2020-08-06 MED ORDER — EPOETIN ALFA-EPBX 40000 UNIT/ML IJ SOLN
INTRAMUSCULAR | Status: AC
  Filled 2020-08-06: qty 1

## 2020-08-06 MED ORDER — EPOETIN ALFA-EPBX 40000 UNIT/ML IJ SOLN
40000.0000 [IU] | Freq: Once | INTRAMUSCULAR | Status: AC
  Administered 2020-08-06: 40000 [IU] via SUBCUTANEOUS

## 2020-08-06 NOTE — Progress Notes (Signed)
Patient tolerated Retacrit injection with no complaints voiced.  Site clean and dry with no bruising or swelling noted.  No complaints of pain.  Discharged with vital signs stable and no signs or symptoms of distress noted.  

## 2020-08-06 NOTE — Progress Notes (Signed)
Clarified retacrit:  Please give Retacrit weekly until Hgb reaches 11 then will go to every 2 weeks.  Per Dr Delton Coombes:  pleae continue weekly till hb above 11, then can switch to q 2wk    T.O. Dr Rhys Martini, PharmD

## 2020-08-07 DIAGNOSIS — I480 Paroxysmal atrial fibrillation: Secondary | ICD-10-CM | POA: Diagnosis not present

## 2020-08-07 DIAGNOSIS — I5030 Unspecified diastolic (congestive) heart failure: Secondary | ICD-10-CM | POA: Diagnosis not present

## 2020-08-07 DIAGNOSIS — N189 Chronic kidney disease, unspecified: Secondary | ICD-10-CM | POA: Diagnosis not present

## 2020-08-07 DIAGNOSIS — J45909 Unspecified asthma, uncomplicated: Secondary | ICD-10-CM | POA: Diagnosis not present

## 2020-08-07 DIAGNOSIS — I2510 Atherosclerotic heart disease of native coronary artery without angina pectoris: Secondary | ICD-10-CM | POA: Diagnosis not present

## 2020-08-07 DIAGNOSIS — K760 Fatty (change of) liver, not elsewhere classified: Secondary | ICD-10-CM | POA: Diagnosis not present

## 2020-08-07 DIAGNOSIS — D631 Anemia in chronic kidney disease: Secondary | ICD-10-CM | POA: Diagnosis not present

## 2020-08-07 DIAGNOSIS — I130 Hypertensive heart and chronic kidney disease with heart failure and stage 1 through stage 4 chronic kidney disease, or unspecified chronic kidney disease: Secondary | ICD-10-CM | POA: Diagnosis not present

## 2020-08-07 DIAGNOSIS — E1122 Type 2 diabetes mellitus with diabetic chronic kidney disease: Secondary | ICD-10-CM | POA: Diagnosis not present

## 2020-08-07 DIAGNOSIS — E785 Hyperlipidemia, unspecified: Secondary | ICD-10-CM | POA: Diagnosis not present

## 2020-08-08 DIAGNOSIS — I130 Hypertensive heart and chronic kidney disease with heart failure and stage 1 through stage 4 chronic kidney disease, or unspecified chronic kidney disease: Secondary | ICD-10-CM | POA: Diagnosis not present

## 2020-08-08 DIAGNOSIS — N189 Chronic kidney disease, unspecified: Secondary | ICD-10-CM | POA: Diagnosis not present

## 2020-08-08 DIAGNOSIS — E1122 Type 2 diabetes mellitus with diabetic chronic kidney disease: Secondary | ICD-10-CM | POA: Diagnosis not present

## 2020-08-08 DIAGNOSIS — D631 Anemia in chronic kidney disease: Secondary | ICD-10-CM | POA: Diagnosis not present

## 2020-08-08 DIAGNOSIS — I2510 Atherosclerotic heart disease of native coronary artery without angina pectoris: Secondary | ICD-10-CM | POA: Diagnosis not present

## 2020-08-08 DIAGNOSIS — I5030 Unspecified diastolic (congestive) heart failure: Secondary | ICD-10-CM | POA: Diagnosis not present

## 2020-08-08 DIAGNOSIS — E785 Hyperlipidemia, unspecified: Secondary | ICD-10-CM | POA: Diagnosis not present

## 2020-08-08 DIAGNOSIS — I480 Paroxysmal atrial fibrillation: Secondary | ICD-10-CM | POA: Diagnosis not present

## 2020-08-08 DIAGNOSIS — K760 Fatty (change of) liver, not elsewhere classified: Secondary | ICD-10-CM | POA: Diagnosis not present

## 2020-08-08 DIAGNOSIS — J45909 Unspecified asthma, uncomplicated: Secondary | ICD-10-CM | POA: Diagnosis not present

## 2020-08-09 ENCOUNTER — Other Ambulatory Visit (HOSPITAL_COMMUNITY): Payer: Self-pay

## 2020-08-09 DIAGNOSIS — I130 Hypertensive heart and chronic kidney disease with heart failure and stage 1 through stage 4 chronic kidney disease, or unspecified chronic kidney disease: Secondary | ICD-10-CM | POA: Diagnosis not present

## 2020-08-09 DIAGNOSIS — D509 Iron deficiency anemia, unspecified: Secondary | ICD-10-CM

## 2020-08-09 DIAGNOSIS — I5022 Chronic systolic (congestive) heart failure: Secondary | ICD-10-CM | POA: Diagnosis not present

## 2020-08-09 DIAGNOSIS — Z515 Encounter for palliative care: Secondary | ICD-10-CM | POA: Diagnosis not present

## 2020-08-09 DIAGNOSIS — N1832 Chronic kidney disease, stage 3b: Secondary | ICD-10-CM | POA: Diagnosis not present

## 2020-08-09 NOTE — Progress Notes (Signed)
Spoke with pt's spouse, Lelon Frohlich ok per DPR and notified of results/recs  She verbalized understading  Nothing further needed

## 2020-08-12 DIAGNOSIS — I5032 Chronic diastolic (congestive) heart failure: Secondary | ICD-10-CM | POA: Diagnosis not present

## 2020-08-12 DIAGNOSIS — I129 Hypertensive chronic kidney disease with stage 1 through stage 4 chronic kidney disease, or unspecified chronic kidney disease: Secondary | ICD-10-CM | POA: Diagnosis not present

## 2020-08-12 DIAGNOSIS — N184 Chronic kidney disease, stage 4 (severe): Secondary | ICD-10-CM | POA: Diagnosis not present

## 2020-08-12 DIAGNOSIS — D631 Anemia in chronic kidney disease: Secondary | ICD-10-CM | POA: Diagnosis not present

## 2020-08-13 ENCOUNTER — Telehealth: Payer: Self-pay | Admitting: *Deleted

## 2020-08-13 NOTE — Telephone Encounter (Signed)
Pt had appt for follow up on bw and anxiety and depression on the 20th. Ann wanted to reschedule til next week due to weather and not having a chance to do bw yet. She had questions about his insulin and if he was taking enough insulin. Takes 15 units of levemir at 11am and novolog 15 units with meals Checks blood sugar fasting in the mornings.  1/5 - 185 1/6 - 182 1/7 - 196 1/8- 202 1/9 - 205 1/10 - 171 1/11 - 164 1/12 - 174 1/13 - 204 1/14- 189 1/15 - 235 1/16 - 190 1/17 - 190 1/18- 186

## 2020-08-13 NOTE — Telephone Encounter (Signed)
He can increase the levemir to 20 units daily.  Cont with this novolog dosing.  Cont to record bg and call with numbers if still seeing numbers over 180s in am.  Thx.   Dr. Darene Lamer

## 2020-08-14 ENCOUNTER — Ambulatory Visit (INDEPENDENT_AMBULATORY_CARE_PROVIDER_SITE_OTHER): Payer: Medicare HMO | Admitting: Gastroenterology

## 2020-08-14 ENCOUNTER — Other Ambulatory Visit: Payer: Self-pay | Admitting: Adult Health

## 2020-08-14 ENCOUNTER — Other Ambulatory Visit: Payer: Self-pay | Admitting: Family Medicine

## 2020-08-14 ENCOUNTER — Ambulatory Visit (HOSPITAL_COMMUNITY): Payer: Medicare HMO | Admitting: Hematology

## 2020-08-14 ENCOUNTER — Ambulatory Visit (HOSPITAL_COMMUNITY): Payer: Medicare HMO

## 2020-08-14 ENCOUNTER — Inpatient Hospital Stay (HOSPITAL_COMMUNITY): Payer: Medicare HMO

## 2020-08-14 DIAGNOSIS — E1122 Type 2 diabetes mellitus with diabetic chronic kidney disease: Secondary | ICD-10-CM | POA: Diagnosis not present

## 2020-08-14 DIAGNOSIS — I130 Hypertensive heart and chronic kidney disease with heart failure and stage 1 through stage 4 chronic kidney disease, or unspecified chronic kidney disease: Secondary | ICD-10-CM | POA: Diagnosis not present

## 2020-08-14 DIAGNOSIS — E785 Hyperlipidemia, unspecified: Secondary | ICD-10-CM | POA: Diagnosis not present

## 2020-08-14 DIAGNOSIS — D631 Anemia in chronic kidney disease: Secondary | ICD-10-CM | POA: Diagnosis not present

## 2020-08-14 DIAGNOSIS — I5030 Unspecified diastolic (congestive) heart failure: Secondary | ICD-10-CM | POA: Diagnosis not present

## 2020-08-14 DIAGNOSIS — K760 Fatty (change of) liver, not elsewhere classified: Secondary | ICD-10-CM | POA: Diagnosis not present

## 2020-08-14 DIAGNOSIS — J45909 Unspecified asthma, uncomplicated: Secondary | ICD-10-CM | POA: Diagnosis not present

## 2020-08-14 DIAGNOSIS — I2510 Atherosclerotic heart disease of native coronary artery without angina pectoris: Secondary | ICD-10-CM | POA: Diagnosis not present

## 2020-08-14 DIAGNOSIS — I480 Paroxysmal atrial fibrillation: Secondary | ICD-10-CM | POA: Diagnosis not present

## 2020-08-14 DIAGNOSIS — N189 Chronic kidney disease, unspecified: Secondary | ICD-10-CM | POA: Diagnosis not present

## 2020-08-14 NOTE — Telephone Encounter (Signed)
Left message to return call 

## 2020-08-15 ENCOUNTER — Ambulatory Visit: Payer: Medicare HMO | Admitting: Family Medicine

## 2020-08-15 NOTE — Telephone Encounter (Signed)
Ann notified of Dr. Tanna Furry note and verbalized understanding.

## 2020-08-16 DIAGNOSIS — I130 Hypertensive heart and chronic kidney disease with heart failure and stage 1 through stage 4 chronic kidney disease, or unspecified chronic kidney disease: Secondary | ICD-10-CM | POA: Diagnosis not present

## 2020-08-16 DIAGNOSIS — I480 Paroxysmal atrial fibrillation: Secondary | ICD-10-CM | POA: Diagnosis not present

## 2020-08-16 DIAGNOSIS — J45909 Unspecified asthma, uncomplicated: Secondary | ICD-10-CM | POA: Diagnosis not present

## 2020-08-16 DIAGNOSIS — I5030 Unspecified diastolic (congestive) heart failure: Secondary | ICD-10-CM | POA: Diagnosis not present

## 2020-08-16 DIAGNOSIS — N189 Chronic kidney disease, unspecified: Secondary | ICD-10-CM | POA: Diagnosis not present

## 2020-08-16 DIAGNOSIS — K760 Fatty (change of) liver, not elsewhere classified: Secondary | ICD-10-CM | POA: Diagnosis not present

## 2020-08-16 DIAGNOSIS — E1122 Type 2 diabetes mellitus with diabetic chronic kidney disease: Secondary | ICD-10-CM | POA: Diagnosis not present

## 2020-08-16 DIAGNOSIS — I2510 Atherosclerotic heart disease of native coronary artery without angina pectoris: Secondary | ICD-10-CM | POA: Diagnosis not present

## 2020-08-16 DIAGNOSIS — E785 Hyperlipidemia, unspecified: Secondary | ICD-10-CM | POA: Diagnosis not present

## 2020-08-16 DIAGNOSIS — D631 Anemia in chronic kidney disease: Secondary | ICD-10-CM | POA: Diagnosis not present

## 2020-08-19 ENCOUNTER — Telehealth: Payer: Self-pay | Admitting: *Deleted

## 2020-08-19 NOTE — Telephone Encounter (Signed)
Is pt seeing kidney doctor, has elevated Creatinine on his lab.   I know he's seeing hematologist, but hey are not working on his kidney's.   So just let me know who he is seeing.   Thx.   Dr. Lovena Le

## 2020-08-19 NOTE — Telephone Encounter (Signed)
Ann reports pt has been Dr. Clover Mealy with [facility]- had a virtual visit and goes back to see them in person in 4 months.

## 2020-08-19 NOTE — Telephone Encounter (Signed)
Ok, then he doesn't need to get the labs for [place] then as long as he's being seen for his kidney function.   Thx.   Dr. Lovena Le

## 2020-08-19 NOTE — Telephone Encounter (Signed)
Jack Mejia called stating patient is supposed to be getting bmp done prior to appointment on Thursday-  He has abnormal abnormal cmp from another doc in epic and is to get that redone in the next couple of weeks. Is it necessary for him to go get the bmp done?

## 2020-08-20 NOTE — Telephone Encounter (Signed)
Ann notified

## 2020-08-21 DIAGNOSIS — D631 Anemia in chronic kidney disease: Secondary | ICD-10-CM | POA: Diagnosis not present

## 2020-08-21 DIAGNOSIS — I2510 Atherosclerotic heart disease of native coronary artery without angina pectoris: Secondary | ICD-10-CM | POA: Diagnosis not present

## 2020-08-21 DIAGNOSIS — E1122 Type 2 diabetes mellitus with diabetic chronic kidney disease: Secondary | ICD-10-CM | POA: Diagnosis not present

## 2020-08-21 DIAGNOSIS — N189 Chronic kidney disease, unspecified: Secondary | ICD-10-CM | POA: Diagnosis not present

## 2020-08-21 DIAGNOSIS — I130 Hypertensive heart and chronic kidney disease with heart failure and stage 1 through stage 4 chronic kidney disease, or unspecified chronic kidney disease: Secondary | ICD-10-CM | POA: Diagnosis not present

## 2020-08-21 DIAGNOSIS — K760 Fatty (change of) liver, not elsewhere classified: Secondary | ICD-10-CM | POA: Diagnosis not present

## 2020-08-21 DIAGNOSIS — J45909 Unspecified asthma, uncomplicated: Secondary | ICD-10-CM | POA: Diagnosis not present

## 2020-08-21 DIAGNOSIS — I480 Paroxysmal atrial fibrillation: Secondary | ICD-10-CM | POA: Diagnosis not present

## 2020-08-21 DIAGNOSIS — E785 Hyperlipidemia, unspecified: Secondary | ICD-10-CM | POA: Diagnosis not present

## 2020-08-21 DIAGNOSIS — I5030 Unspecified diastolic (congestive) heart failure: Secondary | ICD-10-CM | POA: Diagnosis not present

## 2020-08-22 ENCOUNTER — Ambulatory Visit (INDEPENDENT_AMBULATORY_CARE_PROVIDER_SITE_OTHER): Payer: Medicare HMO | Admitting: Family Medicine

## 2020-08-22 ENCOUNTER — Inpatient Hospital Stay (HOSPITAL_COMMUNITY): Payer: Medicare HMO | Admitting: Hematology

## 2020-08-22 ENCOUNTER — Encounter: Payer: Self-pay | Admitting: Family Medicine

## 2020-08-22 ENCOUNTER — Inpatient Hospital Stay (HOSPITAL_COMMUNITY): Payer: Medicare HMO

## 2020-08-22 ENCOUNTER — Other Ambulatory Visit: Payer: Self-pay

## 2020-08-22 VITALS — BP 148/68 | HR 64 | Temp 97.8°F | Resp 17 | Wt 193.2 lb

## 2020-08-22 VITALS — BP 136/64 | HR 95 | Temp 96.9°F | Wt 193.2 lb

## 2020-08-22 DIAGNOSIS — I2510 Atherosclerotic heart disease of native coronary artery without angina pectoris: Secondary | ICD-10-CM | POA: Diagnosis not present

## 2020-08-22 DIAGNOSIS — R369 Urethral discharge, unspecified: Secondary | ICD-10-CM | POA: Diagnosis not present

## 2020-08-22 DIAGNOSIS — N183 Chronic kidney disease, stage 3 unspecified: Secondary | ICD-10-CM | POA: Diagnosis not present

## 2020-08-22 DIAGNOSIS — I4891 Unspecified atrial fibrillation: Secondary | ICD-10-CM | POA: Diagnosis not present

## 2020-08-22 DIAGNOSIS — D539 Nutritional anemia, unspecified: Secondary | ICD-10-CM | POA: Diagnosis not present

## 2020-08-22 DIAGNOSIS — M129 Arthropathy, unspecified: Secondary | ICD-10-CM | POA: Diagnosis not present

## 2020-08-22 DIAGNOSIS — Z79899 Other long term (current) drug therapy: Secondary | ICD-10-CM | POA: Diagnosis not present

## 2020-08-22 DIAGNOSIS — D509 Iron deficiency anemia, unspecified: Secondary | ICD-10-CM

## 2020-08-22 DIAGNOSIS — F320 Major depressive disorder, single episode, mild: Secondary | ICD-10-CM

## 2020-08-22 DIAGNOSIS — D631 Anemia in chronic kidney disease: Secondary | ICD-10-CM | POA: Diagnosis not present

## 2020-08-22 DIAGNOSIS — J45909 Unspecified asthma, uncomplicated: Secondary | ICD-10-CM | POA: Diagnosis not present

## 2020-08-22 DIAGNOSIS — I129 Hypertensive chronic kidney disease with stage 1 through stage 4 chronic kidney disease, or unspecified chronic kidney disease: Secondary | ICD-10-CM | POA: Diagnosis not present

## 2020-08-22 DIAGNOSIS — E1165 Type 2 diabetes mellitus with hyperglycemia: Secondary | ICD-10-CM

## 2020-08-22 DIAGNOSIS — R69 Illness, unspecified: Secondary | ICD-10-CM | POA: Diagnosis not present

## 2020-08-22 DIAGNOSIS — E1122 Type 2 diabetes mellitus with diabetic chronic kidney disease: Secondary | ICD-10-CM

## 2020-08-22 LAB — IRON AND TIBC
Iron: 59 ug/dL (ref 45–182)
Saturation Ratios: 22 % (ref 17.9–39.5)
TIBC: 273 ug/dL (ref 250–450)
UIBC: 214 ug/dL

## 2020-08-22 LAB — CBC WITH DIFFERENTIAL/PLATELET
Abs Immature Granulocytes: 0.11 10*3/uL — ABNORMAL HIGH (ref 0.00–0.07)
Basophils Absolute: 0 10*3/uL (ref 0.0–0.1)
Basophils Relative: 0 %
Eosinophils Absolute: 0.1 10*3/uL (ref 0.0–0.5)
Eosinophils Relative: 3 %
HCT: 34.7 % — ABNORMAL LOW (ref 39.0–52.0)
Hemoglobin: 11 g/dL — ABNORMAL LOW (ref 13.0–17.0)
Immature Granulocytes: 2 %
Lymphocytes Relative: 26 %
Lymphs Abs: 1.2 10*3/uL (ref 0.7–4.0)
MCH: 35.9 pg — ABNORMAL HIGH (ref 26.0–34.0)
MCHC: 31.7 g/dL (ref 30.0–36.0)
MCV: 113.4 fL — ABNORMAL HIGH (ref 80.0–100.0)
Monocytes Absolute: 0.8 10*3/uL (ref 0.1–1.0)
Monocytes Relative: 18 %
Neutro Abs: 2.3 10*3/uL (ref 1.7–7.7)
Neutrophils Relative %: 51 %
Platelets: 182 10*3/uL (ref 150–400)
RBC: 3.06 MIL/uL — ABNORMAL LOW (ref 4.22–5.81)
RDW: 18.8 % — ABNORMAL HIGH (ref 11.5–15.5)
WBC: 4.5 10*3/uL (ref 4.0–10.5)
nRBC: 0 % (ref 0.0–0.2)

## 2020-08-22 LAB — COMPREHENSIVE METABOLIC PANEL
ALT: 18 U/L (ref 0–44)
AST: 22 U/L (ref 15–41)
Albumin: 3.4 g/dL — ABNORMAL LOW (ref 3.5–5.0)
Alkaline Phosphatase: 104 U/L (ref 38–126)
Anion gap: 9 (ref 5–15)
BUN: 26 mg/dL — ABNORMAL HIGH (ref 8–23)
CO2: 26 mmol/L (ref 22–32)
Calcium: 8.7 mg/dL — ABNORMAL LOW (ref 8.9–10.3)
Chloride: 98 mmol/L (ref 98–111)
Creatinine, Ser: 1.71 mg/dL — ABNORMAL HIGH (ref 0.61–1.24)
GFR, Estimated: 41 mL/min — ABNORMAL LOW (ref >60)
Glucose, Bld: 330 mg/dL — ABNORMAL HIGH (ref 70–99)
Potassium: 4 mmol/L (ref 3.5–5.1)
Sodium: 133 mmol/L — ABNORMAL LOW (ref 135–145)
Total Bilirubin: 1 mg/dL (ref 0.3–1.2)
Total Protein: 6.9 g/dL (ref 6.5–8.1)

## 2020-08-22 LAB — FERRITIN: Ferritin: 254 ng/mL (ref 24–336)

## 2020-08-22 MED ORDER — BD PEN NEEDLE NANO 2ND GEN 32G X 4 MM MISC: 5 refills | Status: DC

## 2020-08-22 MED ORDER — LEVEMIR FLEXTOUCH 100 UNIT/ML ~~LOC~~ SOPN: 22.0000 [IU] | PEN_INJECTOR | Freq: Every day | SUBCUTANEOUS | 1 refills | Status: DC

## 2020-08-22 MED ORDER — SERTRALINE HCL 100 MG PO TABS: ORAL_TABLET | ORAL | 2 refills | Status: DC

## 2020-08-22 NOTE — Progress Notes (Signed)
Received order to decrease Retacrit to 30,000 units and to hold today's dose with Hgb of 11.  T.O. Dr Shearon Balo, RN/Ellana Kawa Aurora Mask, PharmD

## 2020-08-22 NOTE — Progress Notes (Signed)
Patient ID: ANTOIN Mejia, male    DOB: [birth year], 75 y.o.   MRN: [id number]   Chief Complaint  Patient presents with  . Anxiety  . Depression    Patient reports no concerns other than feeling more fatigued lately.    Subjective:    HPI Depression- Pt started zoloft on last visit. Pt feeling down and feeling confined and not able to get out and about like he used to.  Not having any GI symptoms. Getting anxious due to staying in house. Weather not as nice, and is cold so staying in more.  occ getting sob. Saw pulm on 08/05/20- and stating things are improving.  Feeling it from mask.  Intermittent sob. Feeling tired from moving around.  Better with energy after moving around.   Congestion in nose and in chest.   Seeing Dr. Melvyn Novas 2 wks ago- pulm.  Saying looking good.  Blood glucose- 150-190.  Eating btw 830-9am, taking novolog.  2hr later levimir.  Lunch 1230- novolog and at dinner.  Wanting to know if changing time of levimir.  Needing refill- Delica, onetouch, delica plus, 33 gauge.   On last labs- glucose at 348, was at about 1pm. Cr 1.75, calcium is low at 8.4 Albumin down at 3.3 Bun 28,  Sodium at 132.  Hb at 10.1  Medical History Jack Mejia has a past medical history of Anxiety, Arthritis, Asthma, Atrial fibrillation ([place]), CAD (coronary artery disease) (06/29/2019), Cramps of left lower extremity, Depression, Diabetes mellitus, Dyspnea, Elevated liver enzymes, Fatty liver, Gout, History of kidney stones, Hyperlipidemia, Hypertension, Renal insufficiency, and Vertigo.   Outpatient Encounter Medications as of 08/22/2020  Medication Sig  . acetaminophen (TYLENOL) 650 MG CR tablet Take 650 mg by mouth every 8 (eight) hours as needed for pain.  [name] allopurinol (ZYLOPRIM) 100 MG tablet Take 100 mg by mouth daily.  . Amino Acids-Protein Hydrolys (FEEDING SUPPLEMENT, PRO-STAT SUGAR FREE 64,) LIQD Take 30 mLs by mouth 3 (three) times daily with meals. For albumin 2.6   . Balsam [place]-Castor Oil (VENELEX) OINT Apply 1 application topically in the morning, at noon, and at bedtime. Every shift day, evening, and night. Apply to sacrum and bilateral buttocks  . diltiazem (CARDIZEM CD) 360 MG 24 hr capsule Take 1 capsule (360 mg total) by mouth daily.  [name] JARDIANCE 10 MG TABS tablet TAKE 1 TABLET (10 MG TOTAL) BY MOUTH DAILY BEFORE BREAKFAST.  [name] LEVEMIR FLEXTOUCH 100 UNIT/ML FlexPen Inject 22 Units into the skin at bedtime.  . NON FORMULARY Diet: [place], NAS; thin liquids; 1200cc fluid restriction  . NOVOLOG FLEXPEN 100 UNIT/ML FlexPen INJECT 15 UNITS INTO THE SKIN 3 (THREE) TIMES DAILY WITH MEALS  . ONETOUCH ULTRA test strip 1 each 2 (two) times daily.  . OXYGEN Inhale 2 L into the lungs continuous. (Patient not taking: Reported on 08/05/2020)  . sertraline (ZOLOFT) 100 MG tablet Take 1/2 tablet p.o. qhs for 1 wk, then increase to 1 tab daily.  [name] torsemide (DEMADEX) 20 MG tablet Take 1 tablet (20 mg total) by mouth 2 (two) times daily.  . Vitamin D, Ergocalciferol, (DRISDOL) 1.25 MG (50000 UNIT) CAPS capsule Take 1 capsule (50,000 Units total) by mouth every 7 (seven) days.  . [DISCONTINUED] atorvastatin (LIPITOR) 20 MG tablet Take 1 tablet (20 mg total) by mouth daily.  . [DISCONTINUED] BD PEN NEEDLE NANO 2ND GEN 32G X 4 MM MISC   . [DISCONTINUED] BD PEN NEEDLE NANO 2ND GEN 32G X 4 MM MISC Use with  insulin pen qid.  . [DISCONTINUED] LEVEMIR FLEXTOUCH 100 UNIT/ML FlexPen Inject into the skin.  . [DISCONTINUED] metoprolol tartrate (LOPRESSOR) 25 MG tablet Take 1 tablet (25 mg total) by mouth 2 (two) times daily.  . [DISCONTINUED] potassium chloride (KLOR-CON) 10 MEQ tablet Take 1 tablet (10 mEq total) by mouth daily.  . [DISCONTINUED] sertraline (ZOLOFT) 50 MG tablet Take 1/2 tablet p.o. qhs for 1 wk, then increase to 1 tab daily.   No facility-administered encounter medications on file as of 08/22/2020.     Review of Systems  Constitutional: Negative for chills  and fever.  HENT: Negative for congestion, rhinorrhea and sore throat.   Respiratory: Negative for cough, shortness of breath and wheezing.   Cardiovascular: Negative for chest pain and leg swelling.  Gastrointestinal: Negative for abdominal pain, diarrhea, nausea and vomiting.  Genitourinary: Negative for dysuria and frequency.  Skin: Negative for rash.  Neurological: Negative for dizziness, weakness and headaches.  Psychiatric/Behavioral: Positive for dysphoric mood. Negative for self-injury, sleep disturbance and suicidal ideas. The patient is nervous/anxious.      Vitals BP 136/64   Pulse 95   Temp (!) 96.9 F (36.1 C)   Wt 193 lb 3.2 oz (87.6 kg)   SpO2 99%   BMI 27.72 kg/m   Objective:   Physical Exam Vitals and nursing note reviewed.  Constitutional:      General: He is not in acute distress.    Appearance: Normal appearance. He is not ill-appearing.  HENT:     Head: Normocephalic.     Right Ear: Tympanic membrane normal.     Left Ear: Tympanic membrane normal.     Nose: Nose normal. No congestion or rhinorrhea.     Mouth/Throat:     Mouth: Mucous membranes are moist.     Pharynx: No oropharyngeal exudate or posterior oropharyngeal erythema.  Eyes:     Extraocular Movements: Extraocular movements intact.     Conjunctiva/sclera: Conjunctivae normal.     Pupils: Pupils are equal, round, and reactive to light.  Cardiovascular:     Rate and Rhythm: Normal rate and regular rhythm.     Pulses: Normal pulses.     Heart sounds: Normal heart sounds. No murmur heard.   Pulmonary:     Effort: Pulmonary effort is normal. No respiratory distress.     Breath sounds: Normal breath sounds. No wheezing, rhonchi or rales.  Musculoskeletal:        General: Normal range of motion.     Right lower leg: No edema.     Left lower leg: No edema.  Skin:    General: Skin is warm and dry.     Findings: No rash.  Neurological:     General: No focal deficit present.     Mental  Status: He is alert and oriented to person, place, and time.     Cranial Nerves: No cranial nerve deficit.  Psychiatric:        Mood and Affect: Mood normal.        Behavior: Behavior normal.        Thought Content: Thought content normal.        Judgment: Judgment normal.     Assessment and Plan   1. Type 2 diabetes mellitus with hyperglycemia, unspecified whether long term insulin use ([place])  2. Depression, major, single episode, mild ([facility])   DM2- uncontrolled.  Inc levimir from 20 units to 22 units.  Cont novolog with meals.  Cont all other meds.  Depression- improving, but suboptimal.  Inc zoloft from 50mg  to 100mg . His mood seems better at this visit, no longer talking about ending his life.  However, still seems depressed about his limited ability to get out due to all his medical conditions.   F/u 16mo or prn.

## 2020-08-22 NOTE — Progress Notes (Signed)
Patient was assessed by Dr. Delton Coombes and labs have been reviewed. No injection today Hgb is 11.0 today. Primary RN and pharmacy aware.

## 2020-08-22 NOTE — Patient Instructions (Signed)
[place] at [facility] Discharge Instructions  You were seen today by Dr. Delton Coombes. He went over your recent results. You will be referred to a urologist for your penile discharge. Continue returning every 2 weeks to have your blood checked to see if you need Retacrit injections. Dr. Delton Coombes will see you back in 2 months for labs and follow up.   Thank you for choosing [place] at [facility] to provide your oncology and hematology care.  To afford each patient quality time with our provider, please arrive at least 15 minutes before your scheduled appointment time.   If you have a lab appointment with the [facility] please come in thru the Main Entrance and check in at the main information desk  You need to re-schedule your appointment should you arrive 10 or more minutes late.  We strive to give you quality time with our providers, and arriving late affects you and other patients whose appointments are after yours.  Also, if you no show three or more times for appointments you may be dismissed from the clinic at the providers discretion.     Again, thank you for choosing [facility].  Our hope is that these requests will decrease the amount of time that you wait before being seen by our physicians.       _____________________________________________________________  Should you have questions after your visit to [facility], please contact our office at [phone] between the hours of 8:00 a.m. and 4:30 p.m.  Voicemails left after 4:00 p.m. will not be returned until the following business day.  For prescription refill requests, have your pharmacy contact our office and allow 72 hours.    Cancer Center Support Programs:   > Cancer Support Group  2nd Tuesday of the month 1pm-2pm, Journey Room

## 2020-08-22 NOTE — Progress Notes (Signed)
Jack Mejia, [place] [id number]   CLINIC:  Medical Oncology/Hematology  PCP:  Erven Colla, DO [address] / [place] [address]  [phone]  REASON FOR VISIT:  Follow-up for macrocytic anemia  PRIOR THERAPY: Venofer  CURRENT THERAPY: Retacrit every 2 weeks  INTERVAL HISTORY:  Jack Mejia, a 75 y.o. male, returns for routine follow-up for his macrocytic anemia. Jack Mejia was last seen on 04/29/2020.  Today he is accompanied by his wife and he reports feeling okay. He reports that his breathing has improved since he stopped taking amiodarone. He has occasional cough but it unproductive. He can walk short distances from his house to his mailbox, but was unable to walk from the parking lot to the cancer center. His blood thinner was stopped while in the hospital and he will see his cardiologist to see if he needs to restart. He reports having bloody penile discharge that is unrelated to micturition which started while he was in the hospital.   REVIEW OF SYSTEMS:  Review of Systems  Constitutional: Negative for appetite change.  Genitourinary: Positive for penile discharge.   Musculoskeletal: Positive for back pain (7/10 back pain).  All other systems reviewed and are negative.   PAST MEDICAL/SURGICAL HISTORY:  Past Medical History:  Diagnosis Date  . Arthritis   . Asthma   . Atrial fibrillation ([facility])    a. s/p DCCV in 03/2019  . CAD (coronary artery disease) 06/29/2019  . Cramps of left lower extremity   . Depression   . Diabetes mellitus    type 2 for 7-8 yrs  . Dyspnea   . Elevated liver enzymes   . Fatty liver   . History of kidney stones   . Hyperlipidemia   . Hypertension   . Renal insufficiency   . Vertigo    Past Surgical History:  Procedure Laterality Date  . CARDIOVERSION N/A 04/21/2019   Procedure: CARDIOVERSION;  Surgeon: Sanda Klein, MD;  Location: [facility];  Service: Cardiovascular;  Laterality:  N/A;  . CARPAL TUNNEL RELEASE     both wrist  . cataract surgery     bilateral  . CHOLECYSTECTOMY    . COLONOSCOPY  2011  . EYE SURGERY    . HEMORROIDECTOMY    . KNEE SURGERY Left   . LEFT HEART CATH AND CORONARY ANGIOGRAPHY N/A 05/23/2019   Procedure: LEFT HEART CATH AND CORONARY ANGIOGRAPHY;  Surgeon: Belva Crome, MD;  Location: [facility];  Service: Cardiovascular;  Laterality: N/A;  . LUMBAR LAMINECTOMY/DECOMPRESSION MICRODISCECTOMY N/A 08/06/2016   Procedure: LUMBAR THREE- LUMBAR FIVE  DECOMPRESSIVE LUMBAR LAMINECTOMY;  Surgeon: Jovita Gamma, MD;  Location: [facility];  Service: Neurosurgery;  Laterality: N/A;    SOCIAL HISTORY:  Social History   Socioeconomic History  . Marital status: Divorced    Spouse name: Not on file  . Number of children: 2  . Years of education: Not on file  . Highest education level: Not on file  Occupational History  . Occupation: RETIRED  Tobacco Use  . Smoking status: Former Smoker    Packs/day: 4.00    Years: 20.00    Pack years: 80.00    Types: Cigarettes    Quit date: 10/14/1982    Years since quitting: 37.8  . Smokeless tobacco: Never Used  . Tobacco comment: 29 yrs ago  Vaping Use  . Vaping Use: Never used  Substance and Sexual Activity  . Alcohol use: No  .  Drug use: No  . Sexual activity: Not Currently    Birth control/protection: None  Other Topics Concern  . Not on file  Social History Narrative  . Not on file   Social Determinants of Health   Financial Resource Strain: Not on file  Food Insecurity: Not on file  Transportation Needs: Not on file  Physical Activity: Not on file  Stress: Not on file  Social Connections: Not on file  Intimate Partner Violence: Not on file    FAMILY HISTORY:  Family History  Problem Relation Age of Onset  . Diabetes Mother   . Cancer Mother        unknown kind  . Diabetes Sister   . COPD Sister   . Liver disease Brother   . COPD Brother   . Diabetes Maternal  Grandmother   . Diabetes Brother   . COPD Sister   . Healthy Daughter   . Healthy Daughter   . Colon cancer Neg Hx     CURRENT MEDICATIONS:  Current Outpatient Medications  Medication Sig Dispense Refill  . acetaminophen (TYLENOL) 650 MG CR tablet Take 650 mg by mouth every 8 (eight) hours as needed for pain.    [name] allopurinol (ZYLOPRIM) 100 MG tablet Take 100 mg by mouth daily.    . Amino Acids-Protein Hydrolys (FEEDING SUPPLEMENT, PRO-STAT SUGAR FREE 64,) LIQD Take 30 mLs by mouth 3 (three) times daily with meals. For albumin 2.6    . atorvastatin (LIPITOR) 20 MG tablet Take 1 tablet (20 mg total) by mouth daily. 30 tablet 0  . Balsam [place]-Castor Oil (VENELEX) OINT Apply 1 application topically in the morning, at noon, and at bedtime. Every shift day, evening, and night. Apply to sacrum and bilateral buttocks    . BD PEN NEEDLE NANO 2ND GEN 32G X 4 MM MISC Use with insulin pen qid. 120 each 5  . diltiazem (CARDIZEM CD) 360 MG 24 hr capsule Take 1 capsule (360 mg total) by mouth daily. 30 capsule 0  . JARDIANCE 10 MG TABS tablet TAKE 1 TABLET (10 MG TOTAL) BY MOUTH DAILY BEFORE BREAKFAST. 30 tablet 5  . LEVEMIR FLEXTOUCH 100 UNIT/ML FlexPen Inject 22 Units into the skin at bedtime. 15 mL 1  . metoprolol tartrate (LOPRESSOR) 25 MG tablet Take 1 tablet (25 mg total) by mouth 2 (two) times daily. 30 tablet 0  . NON FORMULARY Diet: [place], NAS; thin liquids; 1200cc fluid restriction    . NOVOLOG FLEXPEN 100 UNIT/ML FlexPen INJECT 15 UNITS INTO THE SKIN 3 (THREE) TIMES DAILY WITH MEALS 15 mL 1  . ONETOUCH ULTRA test strip 1 each 2 (two) times daily.    . potassium chloride (KLOR-CON) 10 MEQ tablet Take 1 tablet (10 mEq total) by mouth daily. 30 tablet 1  . sertraline (ZOLOFT) 100 MG tablet Take 1/2 tablet p.o. qhs for 1 wk, then increase to 1 tab daily. 30 tablet 2  . torsemide (DEMADEX) 20 MG tablet Take 1 tablet (20 mg total) by mouth 2 (two) times daily. 60 tablet 0  . Vitamin D,  Ergocalciferol, (DRISDOL) 1.25 MG (50000 UNIT) CAPS capsule Take 1 capsule (50,000 Units total) by mouth every 7 (seven) days. 4 capsule 0  . OXYGEN Inhale 2 L into the lungs continuous. (Patient not taking: Reported on 08/05/2020)     No current facility-administered medications for this visit.    ALLERGIES:  Allergies  Allergen Reactions  . Demerol Nausea And Vomiting  . Vasotec [Enalapril] Cough  .  Lasix [Furosemide] Rash    PHYSICAL EXAM:  Performance status (ECOG): 1 - Symptomatic but completely ambulatory  Vitals:   08/22/20 1622  BP: (!) 148/68  Pulse: 64  Resp: 17  Temp: 97.8 F (36.6 C)  SpO2: 99%   Wt Readings from Last 3 Encounters:  08/22/20 193 lb 4 oz (87.7 kg)  08/22/20 193 lb 3.2 oz (87.6 kg)  08/05/20 187 lb 6.4 oz (85 kg)   Physical Exam Vitals reviewed.  Constitutional:      Appearance: Normal appearance.  Cardiovascular:     Rate and Rhythm: Normal rate and regular rhythm.     Pulses: Normal pulses.     Heart sounds: Normal heart sounds.  Pulmonary:     Effort: Pulmonary effort is normal.     Breath sounds: Normal breath sounds.  Musculoskeletal:     Right lower leg: No edema.     Left lower leg: No edema.  Neurological:     General: No focal deficit present.     Mental Status: He is alert and oriented to person, place, and time.  Psychiatric:        Mood and Affect: Mood normal.        Behavior: Behavior normal.     LABORATORY DATA:  I have reviewed the labs as listed.  CBC Latest Ref Rng & Units 08/22/2020 08/06/2020 07/30/2020  WBC 4.0 - 10.5 K/uL 4.5 8.3 8.5  Hemoglobin 13.0 - 17.0 g/dL 11.0(L) 10.1(L) 9.7(L)  Hematocrit 39.0 - 52.0 % 34.7(L) 31.4(L) 30.0(L)  Platelets 150 - 400 K/uL 182 152 148(L)   CMP Latest Ref Rng & Units 08/22/2020 08/06/2020 07/15/2020  Glucose 70 - 99 mg/dL 330(H) 348(H) 248(H)  BUN 8 - 23 mg/dL 26(H) 28(H) 57(H)  Creatinine 0.61 - 1.24 mg/dL 1.71(H) 1.75(H) 1.88(H)  Sodium 135 - 145 mmol/L 133(L) 132(L) 136   Potassium 3.5 - 5.1 mmol/L 4.0 4.1 3.0(L)  Chloride 98 - 111 mmol/L 98 96(L) 95(L)  CO2 22 - 32 mmol/L $RemoveB'26 24 30  'ClSQymka$ Calcium 8.9 - 10.3 mg/dL 8.7(L) 8.4(L) 8.4(L)  Total Protein 6.5 - 8.1 g/dL 6.9 6.6 -  Total Bilirubin 0.3 - 1.2 mg/dL 1.0 0.9 -  Alkaline Phos 38 - 126 U/L 104 146(H) -  AST 15 - 41 U/L 22 25 -  ALT 0 - 44 U/L 18 40 -      Component Value Date/Time   RBC 3.06 (L) 08/22/2020 1409   MCV 113.4 (H) 08/22/2020 1409   MCV 101 (H) 09/07/2018 1638   MCH 35.9 (H) 08/22/2020 1409   MCHC 31.7 08/22/2020 1409   RDW 18.8 (H) 08/22/2020 1409   RDW 12.8 09/07/2018 1638   LYMPHSABS 1.2 08/22/2020 1409   LYMPHSABS 1.3 09/07/2018 1638   MONOABS 0.8 08/22/2020 1409   EOSABS 0.1 08/22/2020 1409   EOSABS 0.2 09/07/2018 1638   BASOSABS 0.0 08/22/2020 1409   BASOSABS 0.1 09/07/2018 1638   Lab Results  Component Value Date   TIBC 273 08/22/2020   TIBC 250 08/06/2020   TIBC 213 (L) 06/25/2020   FERRITIN 254 08/22/2020   FERRITIN 341 (H) 08/06/2020   FERRITIN 1,250 (H) 06/25/2020   IRONPCTSAT 22 08/22/2020   IRONPCTSAT 22 08/06/2020   IRONPCTSAT 24 06/25/2020    DIAGNOSTIC IMAGING:  I have independently reviewed the scans and discussed with the patient. DG Chest 2 View  Result Date: 08/05/2020 CLINICAL DATA:  Follow-up pneumonia. EXAM: CHEST - 2 VIEW COMPARISON:  07/02/2020; 06/30/2020; 06/27/2020; 06/24/2020; 12/12/2019 FINDINGS:  Grossly unchanged cardiac silhouette and mediastinal contours with atherosclerotic plaque within the thoracic aorta. Overall improved aeration of the lungs with minimal residual interstitial thickening along the right minor fissure. There is persistent mild elevation/eventration of the right hemidiaphragm. No new focal airspace opacities. No pleural effusion or pneumothorax. No evidence of edema. No acute osseous abnormalities. Degenerative change the bilateral glenohumeral joints is suspected though incompletely evaluated. IMPRESSION: 1.  No acute  cardiopulmonary disease. 2. Overall improved aeration of the lungs suggests resolved atypical infection. Electronically Signed   By: Sandi Mariscal M.D.   On: 08/05/2020 09:10     ASSESSMENT:  1. Macrocytic anemia: -Patient seen at the request of Dr. Wolfgang Phoenix for macrocytic anemia. -CTAP on 04/15/2017 showed no splenomegaly. -Patient has macrocytosis noted since February 2020, gradually worsening. -Colonoscopy on 06/05/2010 shows small polyp in the sigmoid colon colon, right colon, hepatic flexure and transverse colon. -He continues to have occasional bleeding on the tissue paper since Eliquis was started. -Serum erythropoietin was 77.2. -SPEP was negative. Other nutritional deficiencies were ruled out. -Bone marrow biopsy on 03/29/2020 shows hypercellular marrow with trilineage dysplasia. Blasts are 2%.  Chromosome analysis and FISH panel were normal. -5 doses of Venofer completed on 01/24/2020. -Retacrit 30,000 units weekly started on 04/08/2020.  2. CKD: -History of CKD since 2017. -Renal ultrasound on 04/21/2019 shows bilateral renal cysts.   PLAN:  1.Low risk MDS: -He is currently receiving Retacrit 40,000 units every 2 weeks. -Labs today showed hemoglobin 11.  Ferritin was 254 and percent saturation of 22. -His insurance will approve Retacrit only if hemoglobin less than 11. -He will continue every 2 weeks CBC for Retacrit. -RTC 8 weeks with repeat iron panel and CBC.  2. CKD: -Creatinine has improved to 1.71.  3. Atrial fibrillation: -His Eliquis was held since recent hospitalization.  He has a follow-up with cardiology.  4. Dyspnea on exertion: -Hospitalized from 06/24/2020 through 07/10/2020 with respiratory failure, thought to be secondary to amiodarone induced pneumonitis.  Amiodarone was discontinued and steroids were given. -He reports dyspnea on exertion has improved but not completely gone yet.  Orders placed this encounter:  Orders Placed This Encounter   Procedures  . Iron and TIBC  . Ferritin  . CBC with Differential/Platelet  . Comprehensive metabolic panel     Derek Jack, MD [place] [phone]   I, Milinda Antis, am acting as a scribe for Dr. Sanda Linger.  I, Derek Jack MD, have reviewed the above documentation for accuracy and completeness, and I agree with the above.

## 2020-08-23 ENCOUNTER — Telehealth: Payer: Self-pay | Admitting: Family Medicine

## 2020-08-23 DIAGNOSIS — D631 Anemia in chronic kidney disease: Secondary | ICD-10-CM | POA: Diagnosis not present

## 2020-08-23 DIAGNOSIS — E1122 Type 2 diabetes mellitus with diabetic chronic kidney disease: Secondary | ICD-10-CM | POA: Diagnosis not present

## 2020-08-23 DIAGNOSIS — I5030 Unspecified diastolic (congestive) heart failure: Secondary | ICD-10-CM | POA: Diagnosis not present

## 2020-08-23 DIAGNOSIS — I2510 Atherosclerotic heart disease of native coronary artery without angina pectoris: Secondary | ICD-10-CM | POA: Diagnosis not present

## 2020-08-23 DIAGNOSIS — N189 Chronic kidney disease, unspecified: Secondary | ICD-10-CM | POA: Diagnosis not present

## 2020-08-23 DIAGNOSIS — K760 Fatty (change of) liver, not elsewhere classified: Secondary | ICD-10-CM | POA: Diagnosis not present

## 2020-08-23 DIAGNOSIS — J45909 Unspecified asthma, uncomplicated: Secondary | ICD-10-CM | POA: Diagnosis not present

## 2020-08-23 DIAGNOSIS — I480 Paroxysmal atrial fibrillation: Secondary | ICD-10-CM | POA: Diagnosis not present

## 2020-08-23 DIAGNOSIS — E785 Hyperlipidemia, unspecified: Secondary | ICD-10-CM | POA: Diagnosis not present

## 2020-08-23 DIAGNOSIS — I130 Hypertensive heart and chronic kidney disease with heart failure and stage 1 through stage 4 chronic kidney disease, or unspecified chronic kidney disease: Secondary | ICD-10-CM | POA: Diagnosis not present

## 2020-08-23 MED ORDER — BD PEN NEEDLE NANO 2ND GEN 32G X 4 MM MISC: 5 refills | Status: AC

## 2020-08-23 NOTE — Telephone Encounter (Signed)
Lancets sent to pharmacy and pt is aware 

## 2020-08-23 NOTE — Telephone Encounter (Signed)
Reminder patient was seen yesterday and his lancets want sent into [facility]. Please advise

## 2020-08-29 ENCOUNTER — Encounter: Payer: Self-pay | Admitting: Urology

## 2020-08-29 ENCOUNTER — Ambulatory Visit (INDEPENDENT_AMBULATORY_CARE_PROVIDER_SITE_OTHER): Payer: Medicare HMO | Admitting: Urology

## 2020-08-29 ENCOUNTER — Ambulatory Visit (HOSPITAL_COMMUNITY)
Admission: RE | Admit: 2020-08-29 | Discharge: 2020-08-29 | Disposition: A | Discharge status: Home / Self Care | Payer: Medicare HMO | Source: Ambulatory Visit | Attending: Urology | Admitting: Urology

## 2020-08-29 ENCOUNTER — Other Ambulatory Visit: Payer: Self-pay

## 2020-08-29 VITALS — BP 125/73 | HR 78 | Temp 98.2°F | Ht 70.0 in | Wt 187.0 lb

## 2020-08-29 DIAGNOSIS — R310 Gross hematuria: Secondary | ICD-10-CM | POA: Diagnosis not present

## 2020-08-29 DIAGNOSIS — N200 Calculus of kidney: Secondary | ICD-10-CM | POA: Diagnosis not present

## 2020-08-29 DIAGNOSIS — N481 Balanitis: Secondary | ICD-10-CM | POA: Diagnosis not present

## 2020-08-29 DIAGNOSIS — N281 Cyst of kidney, acquired: Secondary | ICD-10-CM | POA: Diagnosis not present

## 2020-08-29 DIAGNOSIS — M545 Low back pain, unspecified: Secondary | ICD-10-CM | POA: Diagnosis not present

## 2020-08-29 DIAGNOSIS — R319 Hematuria, unspecified: Secondary | ICD-10-CM | POA: Diagnosis not present

## 2020-08-29 LAB — MICROSCOPIC EXAMINATION
RBC, Urine: >30 /hpf — AB (ref 0–2)
Renal Epithel, UA: NONE SEEN /hpf
WBC, UA: NONE SEEN /hpf (ref 0–5)

## 2020-08-29 LAB — URINALYSIS, ROUTINE W REFLEX MICROSCOPIC
Bilirubin, UA: NEGATIVE
Ketones, UA: NEGATIVE
Leukocytes,UA: NEGATIVE
Nitrite, UA: NEGATIVE
Specific Gravity, UA: 1.015 (ref 1.005–1.030)
Urobilinogen, Ur: 0.2 mg/dL (ref 0.2–1.0)
pH, UA: 6 (ref 5.0–7.5)

## 2020-08-29 MED ORDER — CLOTRIMAZOLE-BETAMETHASONE 1-0.05 % EX CREA: 1.0000 "application " | TOPICAL_CREAM | Freq: Two times a day (BID) | CUTANEOUS | 3 refills | Status: DC

## 2020-08-29 NOTE — Patient Instructions (Signed)
[employer] Clinical Advisor 2018 (pp. [phone]). [place], [place]: Elsevier.">  Balanitis  Balanitis is swelling and irritation of the head of the penis (glans penis). Balanitis occurs most often among males who have not had their foreskin removed (uncircumcised). In uncircumcised males, the condition may also cause inflammation of the skin around the foreskin. Balanitis sometimes causes scarring of the penis or foreskin, which can require surgery. This condition may develop because of an infection or another medical condition. Untreated balanitis can increase the risk of penile cancer. What are the causes? Common causes of this condition include:  Poor personal hygiene, especially in uncircumcised males. Not cleaning the glans penis and foreskin well can result in a buildup of bacteria, viruses, and yeast, which can lead to infection and inflammation.  Irritation and lack of air flow due to fluid (smegma) that can build up on the glans penis. Other causes include:  Chemical irritation from products such as soaps or shower gels, especially those that have fragrance. Chemical irritation can also be caused by condoms, personal lubricants, petroleum jelly, spermicides, or fabric softeners.  Skin conditions, such as eczema, dermatitis, and psoriasis.  Allergies to medicines, such as tetracycline and sulfa drugs. What increases the risk? The following factors may make you more likely to develop this condition:  Being an uncircumcised male.  Having diabetes.  Having other medical conditions, including liver cirrhosis, congestive heart failure, or kidney disease.  Having infections, such as candidiasis, HPV (human papillomavirus), herpes simplex, gonorrhea, or syphilis.  Having a tight foreskin that is difficult to pull back (retract) past the glans penis.  Being severely obese. What are the signs or symptoms? Symptoms of this condition include:  Discharge from under the foreskin, and pain  or difficulty retracting the foreskin.  A bad smell or itchiness on the penis.  Tenderness, redness, and swelling of the glans penis.  A rash or sores on the glans penis or foreskin.  Inability to get an erection due to pain.  Difficulty urinating.  Scarring of the penis or foreskin, in some cases. How is this diagnosed? This condition may be diagnosed based on a physical exam and tests of a swab of discharge to check for bacterial or fungal infection. You may also have blood tests to check for:  Viruses that can cause balanitis.  A high blood sugar (glucose) level. This could be a sign of diabetes, which can increase the risk of balanitis. How is this treated? Treatment for balanitis depends on the cause. Treatment may include:  Improving personal hygiene. Your health care provider may recommend sitting in a bath of warm water that is deep enough to cover your hips and buttocks (sitz bath).  Taking medicines such as: ? Creams or ointments to reduce swelling (steroids) or to treat an infection. ? Antibiotic medicine. ? Antifungal medicine.  Having surgery to remove or cut the foreskin (circumcision). This may be done if you have scarring on the foreskin that makes it difficult to retract.  Controlling other medical problems that may be causing your condition or making it worse. Follow these instructions at home: Medicines  Take over-the-counter and prescription medicines only as told by your health care provider.  If you were prescribed an antibiotic medicine or a cream or ointment, use it as told by your health care provider. Do not stop using your medicine, cream, or ointment even if you start to feel better. General instructions  Do not have sex until the condition clears up, or until your health care   provider approves.  Keep your penis clean and dry. Take sitz baths as recommended by your health care provider.  Avoid products that irritate your skin or make symptoms  worse, such as soaps and shower gels that have fragrance.  Keep all follow-up visits as told by your health care provider. This is important. Contact a health care provider if:  Your symptoms get worse or do not improve with home care.  You develop chills or a fever.  You have trouble urinating.  You cannot retract your foreskin. Get help right away if:  You develop severe pain.  You are unable to urinate. Summary  Balanitis is inflammation of the head of the penis (glans penis) caused by irritation or infection. This condition is most common among uncircumcised males.  Balanitis causes pain, redness, and swelling of the glans penis.  Good personal hygiene is important.  Treatment may include improving personal hygiene and applying creams or ointments.  Contact a health care provider if your symptoms get worse or do not improve with home care. This information is not intended to replace advice given to you by your health care provider. Make sure you discuss any questions you have with your health care provider. Document Revised: 05/03/2019 Document Reviewed: 05/03/2019 Elsevier Patient Education  2021 [employer].  

## 2020-08-29 NOTE — Progress Notes (Signed)
08/29/2020 10:08 AM   Jack Mejia [birth year] [id number]  Referring provider: Erven Colla, DO [place],  [place] [address]  Gross hematuria  HPI: Jack Mejia is a 75yo here for gross hematuria and balanitis. For the past 2 months he has noted intermittent gross hematuria. He has a hx of nephrolithiasis several years ago. He has urinary frequency, nocturia 3-4x, and urinary urgency. He is on Jardiance and Torsemide. For the past month he has noted cracking and bleeding from his foreskin.    PMH: Past Medical History:  Diagnosis Date  . Anxiety   . Arthritis   . Asthma   . Atrial fibrillation ([place])    a. s/p DCCV in 03/2019  . CAD (coronary artery disease) 06/29/2019  . Cramps of left lower extremity   . Depression   . Diabetes mellitus    type 2 for 7-8 yrs  . Dyspnea   . Elevated liver enzymes   . Fatty liver   . Gout   . History of kidney stones   . Hyperlipidemia   . Hypertension   . Renal insufficiency   . Vertigo     Surgical History: Past Surgical History:  Procedure Laterality Date  . CARDIOVERSION N/A 04/21/2019   Procedure: CARDIOVERSION;  Surgeon: Sanda Klein, MD;  Location: [facility];  Service: Cardiovascular;  Laterality: N/A;  . CARPAL TUNNEL RELEASE     both wrist  . cataract surgery     bilateral  . CHOLECYSTECTOMY    . COLONOSCOPY  2011  . EYE SURGERY    . HEMORROIDECTOMY    . KNEE SURGERY Left   . LEFT HEART CATH AND CORONARY ANGIOGRAPHY N/A 05/23/2019   Procedure: LEFT HEART CATH AND CORONARY ANGIOGRAPHY;  Surgeon: Belva Crome, MD;  Location: [facility];  Service: Cardiovascular;  Laterality: N/A;  . LUMBAR LAMINECTOMY/DECOMPRESSION MICRODISCECTOMY N/A 08/06/2016   Procedure: LUMBAR THREE- LUMBAR FIVE  DECOMPRESSIVE LUMBAR LAMINECTOMY;  Surgeon: Jack Gamma, MD;  Location: [facility];  Service: Neurosurgery;  Laterality: N/A;    Home Medications:  Allergies as of 08/29/2020      Reactions   Demerol Nausea  And Vomiting   Vasotec [enalapril] Cough   Lasix [furosemide] Rash      Medication List       Accurate as of August 29, 2020 10:08 AM. If you have any questions, ask your nurse or doctor.        acetaminophen 650 MG CR tablet Commonly known as: TYLENOL Take 650 mg by mouth every 8 (eight) hours as needed for pain.   allopurinol 100 MG tablet Commonly known as: ZYLOPRIM Take 100 mg by mouth daily.   atorvastatin 20 MG tablet Commonly known as: LIPITOR Take 1 tablet (20 mg total) by mouth daily.   BD Pen Needle Nano 2nd Gen 32G X 4 MM Misc Generic drug: Insulin Pen Needle Use with insulin pen qid.   diltiazem 360 MG 24 hr capsule Commonly known as: CARDIZEM CD Take 1 capsule (360 mg total) by mouth daily.   feeding supplement (PRO-STAT SUGAR FREE 64) Liqd Take 30 mLs by mouth 3 (three) times daily with meals. For albumin 2.6   Jardiance 10 MG Tabs tablet Generic drug: empagliflozin TAKE 1 TABLET (10 MG TOTAL) BY MOUTH DAILY BEFORE BREAKFAST.   Levemir FlexTouch 100 UNIT/ML FlexPen Generic drug: insulin detemir Inject 22 Units into the skin at bedtime.   metoprolol tartrate 25 MG tablet Commonly known as: LOPRESSOR Take 1  tablet (25 mg total) by mouth 2 (two) times daily.   NON FORMULARY Diet: [place], NAS; thin liquids; 1200cc fluid restriction   NovoLOG FlexPen 100 UNIT/ML FlexPen Generic drug: insulin aspart INJECT 15 UNITS INTO THE SKIN 3 (THREE) TIMES DAILY WITH MEALS   OneTouch Ultra test strip Generic drug: glucose blood 1 each 2 (two) times daily.   OXYGEN Inhale 2 L into the lungs continuous.   potassium chloride 10 MEQ tablet Commonly known as: KLOR-CON Take 1 tablet (10 mEq total) by mouth daily.   sertraline 100 MG tablet Commonly known as: ZOLOFT Take 1/2 tablet p.o. qhs for 1 wk, then increase to 1 tab daily.   torsemide 20 MG tablet Commonly known as: DEMADEX Take 1 tablet (20 mg total) by mouth 2 (two) times daily.   Venelex  Oint Apply 1 application topically in the morning, at noon, and at bedtime. Every shift day, evening, and night. Apply to sacrum and bilateral buttocks   Vitamin D (Ergocalciferol) 1.25 MG (50000 UNIT) Caps capsule Commonly known as: DRISDOL Take 1 capsule (50,000 Units total) by mouth every 7 (seven) days.       Allergies:  Allergies  Allergen Reactions  . Demerol Nausea And Vomiting  . Vasotec [Enalapril] Cough  . Lasix [Furosemide] Rash    Family History: Family History  Problem Relation Age of Onset  . Diabetes Mother   . Cancer Mother        unknown kind  . Diabetes Sister   . COPD Sister   . Liver disease Brother   . COPD Brother   . Diabetes Maternal Grandmother   . Diabetes Brother   . COPD Sister   . Healthy Daughter   . Healthy Daughter   . Colon cancer Neg Hx     Social History:  reports that he quit smoking about 37 years ago. His smoking use included cigarettes. He has a 80.00 pack-year smoking history. He has never used smokeless tobacco. He reports that he does not drink alcohol and does not use drugs.  ROS: All other review of systems were reviewed and are negative except what is noted above in HPI  Physical Exam: BP 125/73   Pulse 78   Temp 98.2 F (36.8 C)   Ht 5\' 10"  (1.778 m)   Wt 187 lb (84.8 kg)   BMI 26.83 kg/m   Constitutional:  Alert and oriented, No acute distress. HEENT: [place] AT, moist mucus membranes.  Trachea midline, no masses. Cardiovascular: No clubbing, cyanosis, or edema. Respiratory: Normal respiratory effort, no increased work of breathing. GI: Abdomen is soft, nontender, nondistended, no abdominal masses GU: No CVA tenderness. Uncircumcised phallus. Balanitis with cracking/bleeding of his foreskin. No masses/lesions on penis, testis, scrotum.  Lymph: No cervical or inguinal lymphadenopathy. Skin: No rashes, bruises or suspicious lesions. Neurologic: Grossly intact, no focal deficits, moving all 4 extremities. Psychiatric:  Normal mood and affect.  Laboratory Data: Lab Results  Component Value Date   WBC 4.5 08/22/2020   HGB 11.0 (L) 08/22/2020   HCT 34.7 (L) 08/22/2020   MCV 113.4 (H) 08/22/2020   PLT 182 08/22/2020    Lab Results  Component Value Date   CREATININE 1.71 (H) 08/22/2020    Lab Results  Component Value Date   PSA 0.44 02/21/2014    No results found for: TESTOSTERONE  Lab Results  Component Value Date   HGBA1C 8.3 (H) 06/25/2020    Urinalysis    Component Value Date/Time   COLORURINE COLORLESS (  A) 06/06/2020 1151   APPEARANCEUR CLEAR 06/06/2020 1151   LABSPEC 1.013 06/06/2020 1151   PHURINE 5.0 06/06/2020 1151   GLUCOSEU >=500 (A) 06/06/2020 1151   HGBUR MODERATE (A) 06/06/2020 1151   BILIRUBINUR NEGATIVE 06/06/2020 1151   KETONESUR NEGATIVE 06/06/2020 1151   PROTEINUR NEGATIVE 06/06/2020 1151   NITRITE NEGATIVE 06/06/2020 1151   LEUKOCYTESUR NEGATIVE 06/06/2020 1151    Lab Results  Component Value Date   LABMICR 116.5 06/18/2020   BACTERIA NONE SEEN 06/06/2020    Pertinent Imaging:  Results for orders placed during the hospital encounter of 02/14/13  DG Abd 1 View  Narrative *RADIOLOGY REPORT*  Clinical Data: Right sided flank pain for 1 week.  Emesis. Abdominal pain.  ABDOMEN - 1 VIEW  Comparison: CT abdomen and pelvis 02/14/2013  Findings: The right ureteral stones seen on CT is demonstrated to the right of L2-3 and measures about 7 x 14 mm.  No additional stones are identified.  Scattered gas and stool in the colon.  No small or large bowel distension.  Surgical clips in the right upper quadrant.  Degenerative changes in the spine.  IMPRESSION: Ovoid stone demonstrated to the right of L2-3 consistent with stone demonstrated in the right ureteropelvic junction on previous CT scan.   Original Report Authenticated By: Lucienne Capers, M.D.  No results found for this or any previous visit.  No results found for this or any previous  visit.  No results found for this or any previous visit.  Results for orders placed during the hospital encounter of 06/17/20  US RENAL  Narrative CLINICAL DATA:  Chronic renal disease stage IV  EXAM: RENAL / URINARY TRACT ULTRASOUND COMPLETE  COMPARISON:  April 21, 2019  FINDINGS: Right Kidney:  Renal measurements: 10.7 cm x 5.2 cm x 5.5 cm = volume: 162.9 mL. Echogenicity within normal limits. A 3.1 cm x 3.3 cm x 2.7 cm complex anechoic structure is seen within the mid right kidney. No hydronephrosis is visualized.  Left Kidney:  Renal measurements: 12.3 cm x 7.2 cm x 6.1 cm = volume: 283.3 mL. Echogenicity within normal limits. 2.1 cm x 2.1 cm x 2.3 cm and 3.0 cm x 2.1 cm x 2.4 cm anechoic structures are seen within the mid and lower left kidney. An 8.5 mm shadowing echogenic focus is also seen within the lower pole of the left kidney. No hydronephrosis is visualized.  Bladder:  Appears normal for degree of bladder distention. Bilateral ureteral jets are visualized.  Other:  None.  IMPRESSION: 1. Bilateral renal cysts, mildly increased in size when compared to the prior study. 2. Subcentimeter non-obstructing renal stone within the lower pole of the left kidney.   Electronically Signed By: Virgina Norfolk M.D. On: 06/17/2020 23:57  No results found for this or any previous visit.  No results found for this or any previous visit.  No results found for this or any previous visit.   Assessment & Plan:    1. Gross hematuria -BMP -CT hematuria -RTC 4 weeks for cystoscopy  2. Balanitis -clotrimazole BID for 4 weeks   No follow-ups on file.  Nicolette Bang, MD  [facility]

## 2020-08-29 NOTE — Addendum Note (Signed)
Addended by: Valentina Lucks on: 08/29/2020 11:17 AM   Modules accepted: Orders

## 2020-08-29 NOTE — Progress Notes (Signed)
Urological Symptom Review  Patient is experiencing the following symptoms: Frequent urination Hard to postpone urination Get up at night to urinate Leakage of urine Blood in urine Erection problems (male only)   Review of Systems  Gastrointestinal (upper)  : Indigestion/heartburn  Gastrointestinal (lower) : Negative for lower GI symptoms  Constitutional : Fatigue  Skin: Skin rash/lesion Negative for skin symptoms  Eyes: Blurred vision  Ear/Nose/Throat : Sinus problems  Hematologic/Lymphatic: Negative for Hematologic/Lymphatic symptoms  Cardiovascular : Leg swelling  Respiratory : Cough Shortness of breath  Endocrine: Negative for endocrine symptoms  Musculoskeletal: Back pain Joint pain  Neurological: Headaches Dizziness  Psychologic: Anxiety

## 2020-08-30 ENCOUNTER — Other Ambulatory Visit: Payer: Self-pay | Admitting: Family Medicine

## 2020-08-30 DIAGNOSIS — I130 Hypertensive heart and chronic kidney disease with heart failure and stage 1 through stage 4 chronic kidney disease, or unspecified chronic kidney disease: Secondary | ICD-10-CM | POA: Diagnosis not present

## 2020-08-30 DIAGNOSIS — J45909 Unspecified asthma, uncomplicated: Secondary | ICD-10-CM | POA: Diagnosis not present

## 2020-08-30 DIAGNOSIS — E1122 Type 2 diabetes mellitus with diabetic chronic kidney disease: Secondary | ICD-10-CM | POA: Diagnosis not present

## 2020-08-30 DIAGNOSIS — E785 Hyperlipidemia, unspecified: Secondary | ICD-10-CM | POA: Diagnosis not present

## 2020-08-30 DIAGNOSIS — I2510 Atherosclerotic heart disease of native coronary artery without angina pectoris: Secondary | ICD-10-CM | POA: Diagnosis not present

## 2020-08-30 DIAGNOSIS — K760 Fatty (change of) liver, not elsewhere classified: Secondary | ICD-10-CM | POA: Diagnosis not present

## 2020-08-30 DIAGNOSIS — D631 Anemia in chronic kidney disease: Secondary | ICD-10-CM | POA: Diagnosis not present

## 2020-08-30 DIAGNOSIS — I5030 Unspecified diastolic (congestive) heart failure: Secondary | ICD-10-CM | POA: Diagnosis not present

## 2020-08-30 DIAGNOSIS — I480 Paroxysmal atrial fibrillation: Secondary | ICD-10-CM | POA: Diagnosis not present

## 2020-08-30 DIAGNOSIS — N189 Chronic kidney disease, unspecified: Secondary | ICD-10-CM | POA: Diagnosis not present

## 2020-09-02 ENCOUNTER — Other Ambulatory Visit: Payer: Self-pay | Admitting: Family Medicine

## 2020-09-02 ENCOUNTER — Telehealth: Payer: Self-pay | Admitting: Family Medicine

## 2020-09-02 MED ORDER — POTASSIUM CHLORIDE ER 10 MEQ PO TBCR: 10.0000 meq | EXTENDED_RELEASE_TABLET | Freq: Every day | ORAL | 1 refills | Status: DC

## 2020-09-02 NOTE — Telephone Encounter (Signed)
Pt seen 08/22/20 for DM. Please advise. Thank you

## 2020-09-02 NOTE — Telephone Encounter (Signed)
Pt contacted and is aware

## 2020-09-02 NOTE — Telephone Encounter (Signed)
Patient is requesting refill on potassium 10 meq tablets called into [facility] he is completely out

## 2020-09-02 NOTE — Telephone Encounter (Signed)
Sent in script. Thx. D.r Taron Conrey

## 2020-09-05 ENCOUNTER — Inpatient Hospital Stay (HOSPITAL_COMMUNITY): Payer: Medicare HMO | Attending: Hematology

## 2020-09-05 ENCOUNTER — Other Ambulatory Visit: Payer: Self-pay

## 2020-09-05 ENCOUNTER — Inpatient Hospital Stay (HOSPITAL_COMMUNITY): Payer: Medicare HMO

## 2020-09-05 ENCOUNTER — Telehealth: Payer: Self-pay

## 2020-09-05 ENCOUNTER — Encounter (HOSPITAL_COMMUNITY): Payer: Self-pay

## 2020-09-05 VITALS — BP 134/68 | HR 55 | Temp 97.0°F | Resp 18

## 2020-09-05 DIAGNOSIS — D509 Iron deficiency anemia, unspecified: Secondary | ICD-10-CM | POA: Diagnosis not present

## 2020-09-05 DIAGNOSIS — D539 Nutritional anemia, unspecified: Secondary | ICD-10-CM

## 2020-09-05 DIAGNOSIS — N179 Acute kidney failure, unspecified: Secondary | ICD-10-CM

## 2020-09-05 DIAGNOSIS — Z79899 Other long term (current) drug therapy: Secondary | ICD-10-CM | POA: Diagnosis not present

## 2020-09-05 DIAGNOSIS — N183 Chronic kidney disease, stage 3 unspecified: Secondary | ICD-10-CM

## 2020-09-05 DIAGNOSIS — D508 Other iron deficiency anemias: Secondary | ICD-10-CM

## 2020-09-05 LAB — CBC WITH DIFFERENTIAL/PLATELET
Band Neutrophils: 1 %
Basophils Absolute: 0 10*3/uL (ref 0.0–0.1)
Basophils Relative: 0 %
Eosinophils Absolute: 0.4 10*3/uL (ref 0.0–0.5)
Eosinophils Relative: 6 %
HCT: 33.8 % — ABNORMAL LOW (ref 39.0–52.0)
Hemoglobin: 10.7 g/dL — ABNORMAL LOW (ref 13.0–17.0)
Lymphocytes Relative: 31 %
Lymphs Abs: 2.1 10*3/uL (ref 0.7–4.0)
MCH: 35.1 pg — ABNORMAL HIGH (ref 26.0–34.0)
MCHC: 31.7 g/dL (ref 30.0–36.0)
MCV: 110.8 fL — ABNORMAL HIGH (ref 80.0–100.0)
Metamyelocytes Relative: 6 %
Monocytes Absolute: 0.1 10*3/uL (ref 0.1–1.0)
Monocytes Relative: 2 %
Myelocytes: 4 %
Neutro Abs: 3.5 10*3/uL (ref 1.7–7.7)
Neutrophils Relative %: 50 %
Platelets: 227 10*3/uL (ref 150–400)
RBC: 3.05 MIL/uL — ABNORMAL LOW (ref 4.22–5.81)
RDW: 16.8 % — ABNORMAL HIGH (ref 11.5–15.5)
WBC: 6.9 10*3/uL (ref 4.0–10.5)
nRBC: 0 % (ref 0.0–0.2)

## 2020-09-05 MED ORDER — EPOETIN ALFA-EPBX 20000 UNIT/ML IJ SOLN
20000.0000 [IU] | Freq: Once | INTRAMUSCULAR | Status: AC
  Administered 2020-09-05: 20000 [IU] via SUBCUTANEOUS
  Filled 2020-09-05: qty 1

## 2020-09-05 MED ORDER — EPOETIN ALFA-EPBX 10000 UNIT/ML IJ SOLN
10000.0000 [IU] | Freq: Once | INTRAMUSCULAR | Status: AC
  Administered 2020-09-05: 10000 [IU] via SUBCUTANEOUS
  Filled 2020-09-05: qty 1

## 2020-09-05 NOTE — Progress Notes (Signed)
Jack Mejia tolerated Retacrit injection well without complaints or incident. Hgb 10.7 today. VSS Pt discharged via wheelchair in satisfactory condition accompanied by his wife

## 2020-09-05 NOTE — Patient Instructions (Signed)
[facility] Discharge Instructions  Received Retacrit injection today. Follow-up as scheduled   Thank you for choosing [facility] at [facility] to provide your oncology and hematology care.  To afford each patient quality time with our provider, please arrive at least 15 minutes before your scheduled appointment time.   If you have a lab appointment with the Cancer Center please come in thru the Main Entrance and check in at the main information desk.  You need to re-schedule your appointment should you arrive 10 or more minutes late.  We strive to give you quality time with our providers, and arriving late affects you and other patients whose appointments are after yours.  Also, if you no show three or more times for appointments you may be dismissed from the clinic at the providers discretion.     Again, thank you for choosing [facility].  Our hope is that these requests will decrease the amount of time that you wait before being seen by our physicians.       _____________________________________________________________  Should you have questions after your visit to [facility], please contact our office at [phone] and follow the prompts.  Our office hours are 8:00 a.m. and 4:30 p.m. Monday - Friday.  Please note that voicemails left after 4:00 p.m. may not be returned until the following business day.  We are closed weekends and major holidays.  You do have access to a nurse 24-7, just call the main number to the clinic [phone] and do not press any options, hold on the line and a nurse will answer the phone.    For prescription refill requests, have your pharmacy contact our office and allow 72 hours.    Due to Covid, you will need to wear a mask upon entering the hospital. If you do not have a mask, a mask will be given to you at the Main Entrance upon arrival. For doctor visits, patients may  have 1 support person age 18 or older with them. For treatment visits, patients can not have anyone with them due to social distancing guidelines and our immunocompromised population.     

## 2020-09-06 NOTE — Telephone Encounter (Signed)
Tell him he has large bilateral renal stones

## 2020-09-06 NOTE — Telephone Encounter (Signed)
Patient called and made aware.

## 2020-09-09 NOTE — Progress Notes (Deleted)
Cardiology Office Note  Date: 09/09/2020   ID: Jack Mejia, DOB 1945/10/01, MRN [id number]  PCP:  Erven Colla, DO  Cardiologist:  No primary care provider on file. Electrophysiologist:  None   Chief Complaint: Follow-up dyspnea on exertion, PAF, chronic diastolic heart failure, CAD,  History of Present Illness: Jack Mejia is a 75 y.o. male with a history of dyspnea on exertion, atrial fibrillation (status post DCCV 03/2019), CAD, HLD, HTN, DM 2, chronic diastolic heart failure, CKD stage III.   Was last here for follow-up to review recent stress test and echocardiogram results.  Stress test was low risk.  Echocardiogram On 05/08/2020 demonstrated EF 65 to 70%, mild LVH, G1 DD, trivial MR, moderate aortic valve stenosis.  Patient's significant other who was with him stated he went to the kidney clinic [facility] and gave a urine sample.   She stated there was mention of possible renal scan.   Significant other stated [facility] had not called them back regarding follow-up or next steps.  We talked about the moderate aortic valve stenosis and possible contribution to dyspnea on exertion.  Patient had poorly controlled diabetes with daily blood sugars in the 200 range.  He had been managed by PCP for his diabetes.  Mentioned possible referral to endocrinology and valve clinic for further evaluation of both moderate aortic stenosis and uncontrolled diabetes.  Significant other stated due to recent fall patient continued to have significant back pain and hip pain although he had no fractures.  Blood pressure was elevated but significant other stated it may be due to pain.  She stated at home systolic blood pressure usually ranges in the 120s to 130s.  Patient stated he was tired of having to run to the bathroom.  Stated he felt tired and sleepy all the time.  Past Medical History:  Diagnosis Date  . Anxiety   . Arthritis   . Asthma   . Atrial fibrillation ([place])     a. s/p DCCV in 03/2019  . CAD (coronary artery disease) 06/29/2019  . Cramps of left lower extremity   . Depression   . Diabetes mellitus    type 2 for 7-8 yrs  . Dyspnea   . Elevated liver enzymes   . Fatty liver   . Gout   . History of kidney stones   . Hyperlipidemia   . Hypertension   . Renal insufficiency   . Vertigo     Past Surgical History:  Procedure Laterality Date  . CARDIOVERSION N/A 04/21/2019   Procedure: CARDIOVERSION;  Surgeon: Sanda Klein, MD;  Location: [facility];  Service: Cardiovascular;  Laterality: N/A;  . CARPAL TUNNEL RELEASE     both wrist  . cataract surgery     bilateral  . CHOLECYSTECTOMY    . COLONOSCOPY  2011  . EYE SURGERY    . HEMORROIDECTOMY    . KNEE SURGERY Left   . LEFT HEART CATH AND CORONARY ANGIOGRAPHY N/A 05/23/2019   Procedure: LEFT HEART CATH AND CORONARY ANGIOGRAPHY;  Surgeon: Belva Crome, MD;  Location: [facility];  Service: Cardiovascular;  Laterality: N/A;  . LUMBAR LAMINECTOMY/DECOMPRESSION MICRODISCECTOMY N/A 08/06/2016   Procedure: LUMBAR THREE- LUMBAR FIVE  DECOMPRESSIVE LUMBAR LAMINECTOMY;  Surgeon: Jovita Gamma, MD;  Location: [facility];  Service: Neurosurgery;  Laterality: N/A;    Current Outpatient Medications  Medication Sig Dispense Refill  . acetaminophen (TYLENOL) 650 MG CR tablet Take 650 mg by mouth every 8 (  eight) hours as needed for pain.    [name] allopurinol (ZYLOPRIM) 100 MG tablet Take 100 mg by mouth daily.    . Amino Acids-Protein Hydrolys (FEEDING SUPPLEMENT, PRO-STAT SUGAR FREE 64,) LIQD Take 30 mLs by mouth 3 (three) times daily with meals. For albumin 2.6    . atorvastatin (LIPITOR) 20 MG tablet Take 1 tablet (20 mg total) by mouth daily. 30 tablet 0  . Balsam [place]-Castor Oil (VENELEX) OINT Apply 1 application topically in the morning, at noon, and at bedtime. Every shift day, evening, and night. Apply to sacrum and bilateral buttocks    . BD PEN NEEDLE NANO 2ND GEN 32G X 4 MM MISC Use  with insulin pen qid. 120 each 5  . clotrimazole-betamethasone (LOTRISONE) cream Apply 1 application topically 2 (two) times daily. 30 g 3  . diltiazem (CARDIZEM CD) 360 MG 24 hr capsule Take 1 capsule (360 mg total) by mouth daily. 30 capsule 0  . JARDIANCE 10 MG TABS tablet TAKE 1 TABLET (10 MG TOTAL) BY MOUTH DAILY BEFORE BREAKFAST. 30 tablet 5  . LEVEMIR FLEXTOUCH 100 UNIT/ML FlexPen Inject 22 Units into the skin at bedtime. 15 mL 1  . metoprolol tartrate (LOPRESSOR) 25 MG tablet TAKE 1 TABLET BY MOUTH TWICE A DAY 180 tablet 1  . NON FORMULARY Diet: [place], NAS; thin liquids; 1200cc fluid restriction    . NOVOLOG FLEXPEN 100 UNIT/ML FlexPen INJECT 15 UNITS INTO THE SKIN 3 (THREE) TIMES DAILY WITH MEALS 15 mL 1  . ONETOUCH ULTRA test strip 1 each 2 (two) times daily.    . OXYGEN Inhale 2 L into the lungs continuous. (Patient not taking: Reported on 08/05/2020)    . potassium chloride (KLOR-CON) 10 MEQ tablet Take 1 tablet (10 mEq total) by mouth daily. 90 tablet 1  . sertraline (ZOLOFT) 100 MG tablet Take 1/2 tablet p.o. qhs for 1 wk, then increase to 1 tab daily. 30 tablet 2  . torsemide (DEMADEX) 20 MG tablet Take 1 tablet (20 mg total) by mouth 2 (two) times daily. 60 tablet 0  . Vitamin D, Ergocalciferol, (DRISDOL) 1.25 MG (50000 UNIT) CAPS capsule Take 1 capsule (50,000 Units total) by mouth every 7 (seven) days. 4 capsule 0   No current facility-administered medications for this visit.   Allergies:  Demerol, Vasotec [enalapril], and Lasix [furosemide]   Social History: The patient  reports that he quit smoking about 37 years ago. His smoking use included cigarettes. He has a 80.00 pack-year smoking history. He has never used smokeless tobacco. He reports that he does not drink alcohol and does not use drugs.   Family History: The patient's family history includes COPD in his brother, sister, and sister; Cancer in his mother; Diabetes in his brother, maternal grandmother, mother, and  sister; Healthy in his daughter and daughter; Liver disease in his brother.   ROS:  Please see the history of present illness. Otherwise, complete review of systems is positive for none.  All other systems are reviewed and negative.   Physical Exam: VS:  There were no vitals taken for this visit., BMI There is no height or weight on file to calculate BMI.  Wt Readings from Last 3 Encounters:  08/29/20 187 lb (84.8 kg)  08/22/20 193 lb 4 oz (87.7 kg)  08/22/20 193 lb 3.2 oz (87.6 kg)    General: Patient appears comfortable at rest. Neck: Supple, no elevated JVP or carotid bruits, no thyromegaly. Lungs: Clear to auscultation, nonlabored breathing at rest. Cardiac:  Regular rate and rhythm, no S3, systolic murmur 2/6 best heard at right upper sternal border.  Murmur, no pericardial rub. Extremities: No pitting edema, distal pulses 2+. Skin: Warm and dry. Musculoskeletal: No kyphosis. Neuropsychiatric: Alert and oriented x3, affect grossly appropriate.  ECG:  EKG November 01, 2019 sinus bradycardia nonspecific T wave abnormality, prolonged QT QT/QTc 498/476  Recent Labwork: 06/06/2020: TSH 0.490 06/30/2020: Magnesium 1.9 08/06/2020: B Natriuretic Peptide 170.0 08/22/2020: ALT 18; AST 22; BUN 26; Creatinine, Ser 1.71; Potassium 4.0; Sodium 133 09/05/2020: Hemoglobin 10.7; Platelets 227     Component Value Date/Time   CHOL 173 05/04/2018 1056   TRIG 278 (H) 05/04/2018 1056   HDL 34 (L) 05/04/2018 1056   CHOLHDL 5.1 (H) 05/04/2018 1056   CHOLHDL 4.4 02/21/2014 0703   VLDL 38 02/21/2014 0703   LDLCALC 83 05/04/2018 1056    Other Studies Reviewed Today:  NST 05/08/2020 Study Result  Narrative & Impression   No diagnostic ST segment changes to indicate ischemia.  Small, mild intensity, fixed basal inferoseptal defect that is most consistent with soft tissue attenuation. No large ischemic territories noted.  This is a low risk study.  Nuclear stress EF: 57%.    Echocardiogram  05/08/2020 IMPRESSIONS  1. Left ventricular ejection fraction, by estimation, is 65 to 70%. The  left ventricle has normal function. The left ventricle has no regional  wall motion abnormalities. There is mild left ventricular hypertrophy.  Left ventricular diastolic parameters  are consistent with Grade I diastolic dysfunction (impaired relaxation).  2. Right ventricular systolic function is normal. The right ventricular  size is normal. There is mildly elevated pulmonary artery systolic  pressure. The estimated right ventricular systolic pressure is 10.2 mmHg.  3. The mitral valve is grossly normal. Trivial mitral valve  regurgitation.  4. The aortic valve is tricuspid. There is moderate calcification of the  aortic valve. Aortic valve regurgitation is not visualized. Moderate  aortic valve stenosis. Aortic valve mean gradient measures 21.5 mmHg.  Aortic valve Vmax measures 3.08 m/s.  Dimentionless index 0.36.  5. The inferior vena cava is normal in size with greater than 50%  respiratory variability, suggesting right atrial pressure of 3 mmHg.       Cardiac catheterization 05/23/2019:   Normal left main  30 to 40% proximal LAD, first diagonal 50% proximal and 70% distal, and diffuse 70% narrowing in the distal/apical LAD.  First obtuse marginal large in size with proximal segmental 50% narrowing and distal 90% narrowing.  Dominant RCA with 30 to 40% ostial to proximal narrowing, diffuse disease in the ostial to mid PDA 80 to 90%, and 80% segmental proximal second left ventricular branch.  Previous echo documentation of EF greater than 55%. After hydration today LVEDP 26 mmHg.  RECOMMENDATIONS:   Diffuse, predominantly distal coronary artery disease involving PDA, left ventricular branch, diagonal, distal LAD, and first obtuse marginal. Considering multifocality medical therapy is most appropriate especially in absence of any proximal vessel disease. PCI is  possible on the PDA, left ventricular branch, and obtuse marginal but each would require significant contrast and place the patient at risk for acute kidney injury.  Significant elevation in LVEDP and history of orthopnea at home suggest that diastolic heart failure needs therapy with additional diuresis.    Echocardiogram 02/22/2019:  1. The left ventricle has normal systolic function with an ejection  fraction of 60-65%. The cavity size was normal. There is moderately  increased left ventricular wall thickness. Left ventricular diastolic  Doppler parameters  are indeterminate.  2. The right ventricle has normal systolic function. The cavity was  normal. There is no increase in right ventricular wall thickness.  3. No evidence of mitral valve stenosis.  4. The aortic valve is tricuspid. Mild thickening of the aortic valve.  Mild calcification of the aortic valve. No stenosis of the aortic valve.  Mild aortic annular calcification noted.  5. The aorta is normal in size and structure.  6. The aortic root is normal in size and structure.  7. Pulmonary hypertension is indeterminate, inadequate TR jet.    Assessment and Plan:   1. CAD in native artery History of CAD with diffuse predominant distal coronary artery disease involving PDA, left ventricular branch, diagonal, distal LAD and, first obtuse marginal.  Cath report 04/2019 stated PCI was possible on PDA left ventricular branch and OM but each would require significant contrast in place the patient at risk for kidney injury.  Complains of increased dyspnea on exertion.  Could be an anginal equivalent based on history of diabetes and pre-existing coronary artery disease.  See cardiac catheterization report from last year.     2. Chronic diastolic heart failure ([place])  Echo 02/22/2019 showed EF of 60 to 65%.  Moderately increased LV wall thickness.  Decreased torsemide to 20 mg once daily due to decreasing renal function.   Patient complained of increasing dyspnea on exertion and has evidence of a murmur at right upper sternal border question of possible aortic valve stenosis or regurgitation.  Recent echocardiogram 05/08/2020 EF 65 to 70%.  Mild LVH, G1 DD, trivial MR, moderate aortic valve stenosis.  3. Paroxysmal atrial fibrillation ([facility]) Current pulse is regular at 75.  Continue amiodarone 200 mg daily.  Continue Eliquis 5 mg p.o. twice daily.  Continue Cardizem 360 mg p.o. daily.  Recent low risk stress test 05/08/2020  4. Essential hypertension Blood pressure 140/68 today.  Patient significant other states systolic blood pressures usually in the 120s to 130s at home.  Continue Toprol 25 mg p.o. twice daily.    5. Stage 3 chronic kidney disease, unspecified whether stage 3a or 3b CKD ([place]) At last visit decreased torsemide to 20 mg once a day due to decreased renal function.  PCP note states she was going to refer patient to nephrology.  Recent renal function labs 06/07/2020 with creatinine 2.02, GFR 34.  Patient apparently went to [facility] and states he gave Dr. Loni Muse urine sample.  Significant other states there was mention of possible renal scan.  Significant other states she has not received a phone call from [facility] for follow-up.  Advised nursing staff to call [facility] to touch base regarding follow-up.  6.  Moderate aortic stenosis Recent echocardiogram showed moderate aortic valve stenosis.  Offered to refer patient to valve clinic due to continuing dyspnea on exertion.  He denies any other symptoms such as dizziness or anginal symptoms.  Patient significant other states she will talk to him and call us back if they choose to be referred to valve clinic.  7.  Type 2 diabetes uncontrolled Recent hospitalization for HHS.  He was started on insulin drip which resolved the hyperglycemia.  States his blood blood sugars are running around the 200 range at home.   Offered to refer to endocrinology for possible better management.  Significant other wants to talk to the patient and call us back in reference to referral.   Medication Adjustments/Labs and Tests Ordered: Current medicines are reviewed at  length with the patient today.  Concerns regarding medicines are outlined above.   Disposition: Follow-up with Domenic Polite or APP 3 months Signed, Levell July, NP 09/09/2020 1:15 PM    [facility] [place], [place], [place] [address] Phone: [phone]; Fax: [phone]

## 2020-09-10 ENCOUNTER — Ambulatory Visit (INDEPENDENT_AMBULATORY_CARE_PROVIDER_SITE_OTHER): Payer: Medicare HMO | Admitting: Family Medicine

## 2020-09-10 ENCOUNTER — Encounter: Payer: Self-pay | Admitting: Family Medicine

## 2020-09-10 ENCOUNTER — Other Ambulatory Visit: Payer: Self-pay

## 2020-09-10 ENCOUNTER — Other Ambulatory Visit: Payer: Self-pay | Admitting: *Deleted

## 2020-09-10 ENCOUNTER — Ambulatory Visit: Payer: Medicare HMO | Admitting: Family Medicine

## 2020-09-10 VITALS — BP 126/70 | HR 71 | Temp 97.4°F | Ht 70.0 in | Wt 188.0 lb

## 2020-09-10 DIAGNOSIS — M109 Gout, unspecified: Secondary | ICD-10-CM

## 2020-09-10 MED ORDER — ATORVASTATIN CALCIUM 20 MG PO TABS: 20.0000 mg | ORAL_TABLET | Freq: Every day | ORAL | 2 refills | Status: DC

## 2020-09-10 MED ORDER — COLCHICINE 0.6 MG PO TABS: ORAL_TABLET | ORAL | 0 refills | Status: DC

## 2020-09-10 NOTE — Progress Notes (Signed)
Patient ID: Jack Mejia, male    DOB: Jul 23, 1946, 75 y.o.   MRN: [id number]   Chief Complaint  Patient presents with  . Knee Pain   Subjective:    HPI  CC-rt knee pain for 4 days. Tried aspirin and tramadol. Pt seen with caregiver/girlfriend.  Headache, body aches, cough. Did home covid test today and it was negative.   Has aspercreme patch using for knee pain.  knee pain- woke up with rt knee pain. Having some redness.  Pain improve with heating pad.  Sitting in recliner all night.   Medical History Jack Mejia has a past medical history of Anxiety, Arthritis, Asthma, Atrial fibrillation ([place]), CAD (coronary artery disease) (06/29/2019), Cramps of left lower extremity, Depression, Diabetes mellitus, Dyspnea, Elevated liver enzymes, Fatty liver, Gout, History of kidney stones, Hyperlipidemia, Hypertension, Renal insufficiency, and Vertigo.   Outpatient Encounter Medications as of 09/10/2020  Medication Sig  . acetaminophen (TYLENOL) 650 MG CR tablet Take 650 mg by mouth every 8 (eight) hours as needed for pain.  [name] allopurinol (ZYLOPRIM) 100 MG tablet Take 100 mg by mouth daily.  [name] atorvastatin (LIPITOR) 20 MG tablet Take 1 tablet (20 mg total) by mouth daily.  Roseanne Kaufman Peru-Castor Oil (VENELEX) OINT Apply 1 application topically in the morning, at noon, and at bedtime. Every shift day, evening, and night. Apply to sacrum and bilateral buttocks  . BD PEN NEEDLE NANO 2ND GEN 32G X 4 MM MISC Use with insulin pen qid.  . clotrimazole-betamethasone (LOTRISONE) cream Apply 1 application topically 2 (two) times daily.  . colchicine 0.6 MG tablet Take 2 tab p.o. day 1, then take 1 tab daily fill flare resolves.  . diltiazem (CARDIZEM CD) 360 MG 24 hr capsule Take 1 capsule (360 mg total) by mouth daily.  [name] JARDIANCE 10 MG TABS tablet TAKE 1 TABLET (10 MG TOTAL) BY MOUTH DAILY BEFORE BREAKFAST.  [name] LEVEMIR FLEXTOUCH 100 UNIT/ML FlexPen Inject 22 Units into the skin at bedtime.  .  metoprolol tartrate (LOPRESSOR) 25 MG tablet TAKE 1 TABLET BY MOUTH TWICE A DAY  . NON FORMULARY Diet: [place], NAS; thin liquids; 1200cc fluid restriction  . NOVOLOG FLEXPEN 100 UNIT/ML FlexPen INJECT 15 UNITS INTO THE SKIN 3 (THREE) TIMES DAILY WITH MEALS  . ONETOUCH ULTRA test strip 1 each 2 (two) times daily.  . potassium chloride (KLOR-CON) 10 MEQ tablet Take 1 tablet (10 mEq total) by mouth daily.  . sertraline (ZOLOFT) 100 MG tablet Take 1/2 tablet p.o. qhs for 1 wk, then increase to 1 tab daily.  [name] torsemide (DEMADEX) 20 MG tablet Take 1 tablet (20 mg total) by mouth 2 (two) times daily.  . Vitamin D, Ergocalciferol, (DRISDOL) 1.25 MG (50000 UNIT) CAPS capsule Take 1 capsule (50,000 Units total) by mouth every 7 (seven) days.  . Amino Acids-Protein Hydrolys (FEEDING SUPPLEMENT, PRO-STAT SUGAR FREE 64,) LIQD Take 30 mLs by mouth 3 (three) times daily with meals. For albumin 2.6  . OXYGEN Inhale 2 L into the lungs continuous. (Patient not taking: Reported on 08/05/2020)   No facility-administered encounter medications on file as of 09/10/2020.     Review of Systems  Constitutional: Negative for chills and fever.  HENT: Negative for congestion, rhinorrhea and sore throat.   Respiratory: Positive for cough. Negative for shortness of breath and wheezing.   Cardiovascular: Negative for chest pain and leg swelling.  Gastrointestinal: Negative for abdominal pain, diarrhea, nausea and vomiting.  Genitourinary: Negative for dysuria and frequency.  Musculoskeletal:  Positive for arthralgias (rt knee pain ) and myalgias.  Skin: Negative for rash.  Neurological: Positive for headaches. Negative for dizziness and weakness.      Vitals BP 126/70   Pulse 71   Temp (!) 97.4 F (36.3 C)   Ht 5\' 10"  (1.778 m)   Wt 188 lb (85.3 kg)   SpO2 96%   BMI 26.98 kg/m   Objective:   Physical Exam Vitals and nursing note reviewed.  Constitutional:      General: He is not in acute distress.     Appearance: Normal appearance. He is not ill-appearing.  HENT:     Head: Normocephalic.     Nose: Nose normal. No congestion.     Mouth/Throat:     Mouth: Mucous membranes are moist.     Pharynx: No oropharyngeal exudate.  Eyes:     Extraocular Movements: Extraocular movements intact.     Conjunctiva/sclera: Conjunctivae normal.     Pupils: Pupils are equal, round, and reactive to light.  Cardiovascular:     Rate and Rhythm: Normal rate and regular rhythm.     Pulses: Normal pulses.     Heart sounds: Normal heart sounds. No murmur heard.   Pulmonary:     Effort: Pulmonary effort is normal.     Breath sounds: Normal breath sounds. No wheezing, rhonchi or rales.  Musculoskeletal:        General: Swelling and tenderness (rt knee, with ttp, erythema, swelling., warmth ) present. Normal range of motion.     Right lower leg: No edema.     Left lower leg: No edema.  Skin:    General: Skin is warm and dry.     Findings: No rash.  Neurological:     General: No focal deficit present.     Mental Status: He is alert and oriented to person, place, and time.     Cranial Nerves: No cranial nerve deficit.  Psychiatric:        Mood and Affect: Mood normal.        Behavior: Behavior normal.        Thought Content: Thought content normal.        Judgment: Judgment normal.      Assessment and Plan   1. Acute gout of right knee, unspecified cause   Pt has past h/o rt knee gout- Gave small course of colchcine.  Pt to take allopurinol after the gout flare resolves. Handout given with instructions for caregiver/partner to start colchicine and after pain resolves to stop it and start allopurinol.  Pt is taking toresemide, this might be increasing his gout flares. We will cont to monitor.  Pt unable to take much prednsione.  Went into hospital in Fall from taking prednisone and not having his diabetes under good control and caused him to have HHS and admitted to hospital.  Pt likely  starting a viral syndrome.  If worsening then call or rto.  Pt in agreement.  F/u prn.

## 2020-09-11 ENCOUNTER — Other Ambulatory Visit: Payer: Self-pay | Admitting: Adult Health

## 2020-09-11 ENCOUNTER — Other Ambulatory Visit: Payer: Self-pay | Admitting: Family Medicine

## 2020-09-12 ENCOUNTER — Other Ambulatory Visit: Payer: Self-pay | Admitting: Family Medicine

## 2020-09-12 DIAGNOSIS — Z515 Encounter for palliative care: Secondary | ICD-10-CM | POA: Diagnosis not present

## 2020-09-12 DIAGNOSIS — I5022 Chronic systolic (congestive) heart failure: Secondary | ICD-10-CM | POA: Diagnosis not present

## 2020-09-12 DIAGNOSIS — N1832 Chronic kidney disease, stage 3b: Secondary | ICD-10-CM | POA: Diagnosis not present

## 2020-09-12 DIAGNOSIS — I130 Hypertensive heart and chronic kidney disease with heart failure and stage 1 through stage 4 chronic kidney disease, or unspecified chronic kidney disease: Secondary | ICD-10-CM | POA: Diagnosis not present

## 2020-09-12 NOTE — Telephone Encounter (Signed)
Pt needs to get torsemide refills from cardiology, NP Leonides Sake, since they prescribe this.   Thx.   Dr. Lovena Le

## 2020-09-14 ENCOUNTER — Other Ambulatory Visit: Payer: Self-pay | Admitting: Family Medicine

## 2020-09-19 ENCOUNTER — Other Ambulatory Visit: Payer: Self-pay

## 2020-09-19 ENCOUNTER — Inpatient Hospital Stay (HOSPITAL_COMMUNITY): Payer: Medicare HMO

## 2020-09-19 DIAGNOSIS — D509 Iron deficiency anemia, unspecified: Secondary | ICD-10-CM | POA: Diagnosis not present

## 2020-09-19 DIAGNOSIS — D539 Nutritional anemia, unspecified: Secondary | ICD-10-CM

## 2020-09-19 DIAGNOSIS — Z79899 Other long term (current) drug therapy: Secondary | ICD-10-CM | POA: Diagnosis not present

## 2020-09-19 LAB — CBC WITH DIFFERENTIAL/PLATELET
Band Neutrophils: 2 %
Basophils Absolute: 0 10*3/uL (ref 0.0–0.1)
Basophils Relative: 0 %
Eosinophils Absolute: 0.8 10*3/uL — ABNORMAL HIGH (ref 0.0–0.5)
Eosinophils Relative: 9 %
HCT: 38.3 % — ABNORMAL LOW (ref 39.0–52.0)
Hemoglobin: 12.3 g/dL — ABNORMAL LOW (ref 13.0–17.0)
Lymphocytes Relative: 29 %
Lymphs Abs: 2.5 10*3/uL (ref 0.7–4.0)
MCH: 34.3 pg — ABNORMAL HIGH (ref 26.0–34.0)
MCHC: 32.1 g/dL (ref 30.0–36.0)
MCV: 106.7 fL — ABNORMAL HIGH (ref 80.0–100.0)
Metamyelocytes Relative: 3 %
Monocytes Absolute: 0.7 10*3/uL (ref 0.1–1.0)
Monocytes Relative: 8 %
Myelocytes: 2 %
Neutro Abs: 4.1 10*3/uL (ref 1.7–7.7)
Neutrophils Relative %: 46 %
Platelets: 190 10*3/uL (ref 150–400)
Promyelocytes Relative: 1 %
RBC: 3.59 MIL/uL — ABNORMAL LOW (ref 4.22–5.81)
RDW: 15.7 % — ABNORMAL HIGH (ref 11.5–15.5)
WBC: 8.6 10*3/uL (ref 4.0–10.5)
nRBC: 0 % (ref 0.0–0.2)

## 2020-09-19 NOTE — Progress Notes (Signed)
Hemoglobin 12.3, no retacrit needed today.

## 2020-09-23 NOTE — Progress Notes (Unsigned)
Cardiology Office Note  Date: 09/24/2020   ID: [facility] BLOODWORTH, DOB 1946-01-03, MRN [id number]  PCP:  Erven Colla, DO  Cardiologist:  No primary care provider on file. Electrophysiologist:  None   Chief Complaint: Follow-up dyspnea on exertion, PAF, chronic diastolic heart failure, CAD,  History of Present Illness: Jack Mejia is a 75 y.o. male with a history of dyspnea on exertion, atrial fibrillation (status post DCCV 03/2019), CAD, HLD, HTN, DM 2, chronic diastolic heart failure, CKD stage III.   Was last here for follow-up to review recent stress test and echocardiogram results.  Stress test was low risk.  Echocardiogram On 05/08/2020 demonstrated EF 65 to 70%, mild LVH, G1 DD, trivial MR, moderate aortic valve stenosis.  Patient's significant other who was with him stated he went to the kidney clinic [facility] and gave a urine sample.   She stated there was mention of possible renal scan.   Significant other stated [facility] had not called them back regarding follow-up or next steps.  We talked about the moderate aortic valve stenosis and possible contribution to dyspnea on exertion.  Patient had poorly controlled diabetes with daily blood sugars in the 200 range.  He had been managed by PCP for his diabetes.  Mentioned possible referral to endocrinology and valve clinic for further evaluation of both moderate aortic stenosis and uncontrolled diabetes.  Significant other stated due to recent fall patient continued to have significant back pain and hip pain although he had no fractures.  Blood pressure was elevated but significant other stated it may be due to pain.  She stated at home systolic blood pressure usually ranges in the 120s to 130s.  Patient stated he was tired of having to run to the bathroom.    Here status post hospital visit follow-up. 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 Amiodarone was stopped. He was treated with IV Solu-Medrol and transition to prednisone. On oxygen 2 L. Acute on chronic diastolic heart failure with volume overload status worsened by transfusion of PRBCs. With continuing Bumex daily and transition to torsemide. He was to continue fluid restrictions. Had lobar pneumonia and treated with ceftriaxone and doxycycline, bronchodilators, mucolytic's. Had severe sepsis with leukocytosis and tachypnea daily to ammonia. Hematochezia with hemoglobin 7.5. Received 2 units of packed red blood cells. Apixaban was being held due to hematochezia. Case was discussed with Dr. Jenetta Downer who agreed it would be reasonable to hold anticoagulation until he could be seen in follow-up by GI.  He is here today for follow-up states he is feeling much better. States he has lost some weight. Continues to have some shortness of breath on exertional activity but he is not very active. Denies any edema or significant weight gain. Today's weight is 187 which appears to be his baseline. He continues taking torsemide p.o. twice daily. He denies any palpitations or arrhythmias. Heart rate is controlled and regular without amiodarone. Continuing diltiazem 360 mg daily and metoprolol 25 mg p.o. twice daily.  Past Medical History:  Diagnosis Date  . Anxiety   . Arthritis   . Asthma   . Atrial fibrillation ([place])    a. s/p DCCV in 03/2019  . CAD (coronary artery disease) 06/29/2019  . Cramps of left lower extremity   . Depression   . Diabetes mellitus    type 2 for 7-8 yrs  . Dyspnea   .  Elevated liver enzymes   . Fatty liver   . Gout   . History of kidney stones   . Hyperlipidemia   . Hypertension   . Renal insufficiency   . Vertigo     Past Surgical History:  Procedure Laterality Date  . CARDIOVERSION N/A 04/21/2019   Procedure: CARDIOVERSION;  Surgeon: Sanda Klein, MD;  Location: [facility];  Service: Cardiovascular;   Laterality: N/A;  . CARPAL TUNNEL RELEASE     both wrist  . cataract surgery     bilateral  . CHOLECYSTECTOMY    . COLONOSCOPY  2011  . EYE SURGERY    . HEMORROIDECTOMY    . KNEE SURGERY Left   . LEFT HEART CATH AND CORONARY ANGIOGRAPHY N/A 05/23/2019   Procedure: LEFT HEART CATH AND CORONARY ANGIOGRAPHY;  Surgeon: Belva Crome, MD;  Location: [facility];  Service: Cardiovascular;  Laterality: N/A;  . LUMBAR LAMINECTOMY/DECOMPRESSION MICRODISCECTOMY N/A 08/06/2016   Procedure: LUMBAR THREE- LUMBAR FIVE  DECOMPRESSIVE LUMBAR LAMINECTOMY;  Surgeon: Jovita Gamma, MD;  Location: [place];  Service: Neurosurgery;  Laterality: N/A;    Current Outpatient Medications  Medication Sig Dispense Refill  . acetaminophen (TYLENOL) 650 MG CR tablet Take 650 mg by mouth every 8 (eight) hours as needed for pain.    [name] allopurinol (ZYLOPRIM) 100 MG tablet Take 100 mg by mouth daily.    [name] atorvastatin (LIPITOR) 20 MG tablet Take 1 tablet (20 mg total) by mouth daily. 30 tablet 2  . Balsam [place]-Castor Oil (VENELEX) OINT Apply 1 application topically in the morning, at noon, and at bedtime. Every shift day, evening, and night. Apply to sacrum and bilateral buttocks    . BD PEN NEEDLE NANO 2ND GEN 32G X 4 MM MISC Use with insulin pen qid. 120 each 5  . clotrimazole-betamethasone (LOTRISONE) cream Apply 1 application topically 2 (two) times daily. 30 g 3  . diltiazem (CARDIZEM CD) 360 MG 24 hr capsule Take 1 capsule (360 mg total) by mouth daily. 30 capsule 0  . JARDIANCE 10 MG TABS tablet TAKE 1 TABLET (10 MG TOTAL) BY MOUTH DAILY BEFORE BREAKFAST. 30 tablet 5  . LEVEMIR FLEXTOUCH 100 UNIT/ML FlexPen Inject 22 Units into the skin at bedtime. 15 mL 1  . metoprolol tartrate (LOPRESSOR) 25 MG tablet TAKE 1 TABLET BY MOUTH TWICE A DAY 180 tablet 1  . NOVOLOG FLEXPEN 100 UNIT/ML FlexPen INJECT 15 UNITS INTO THE SKIN 3 (THREE) TIMES DAILY WITH MEALS 15 mL 1  . ONETOUCH ULTRA test strip 1 each 2 (two) times  daily.    . potassium chloride (KLOR-CON) 10 MEQ tablet Take 1 tablet (10 mEq total) by mouth daily. 90 tablet 1  . sertraline (ZOLOFT) 100 MG tablet Take 1 tab p.o. qd 90 tablet 1  . torsemide (DEMADEX) 20 MG tablet Take 1 tablet (20 mg total) by mouth 2 (two) times daily. 60 tablet 0  . Vitamin D, Ergocalciferol, (DRISDOL) 1.25 MG (50000 UNIT) CAPS capsule Take 1 capsule (50,000 Units total) by mouth every 7 (seven) days. 4 capsule 0   No current facility-administered medications for this visit.   Allergies:  Demerol, Vasotec [enalapril], and Lasix [furosemide]   Social History: The patient  reports that he quit smoking about 37 years ago. His smoking use included cigarettes. He has a 80.00 pack-year smoking history. He has never used smokeless tobacco. He reports that he does not drink alcohol and does not use drugs.   Family History: The patient's  family history includes COPD in his brother, sister, and sister; Cancer in his mother; Diabetes in his brother, maternal grandmother, mother, and sister; Healthy in his daughter and daughter; Liver disease in his brother.   ROS:  Please see the history of present illness. Otherwise, complete review of systems is positive for none.  All other systems are reviewed and negative.   Physical Exam: VS:  BP 140/70   Pulse (!) 59   Ht 5\' 10"  (1.778 m)   Wt 187 lb 3.2 oz (84.9 kg)   SpO2 98%   BMI 26.86 kg/m , BMI Body mass index is 26.86 kg/m.  Wt Readings from Last 3 Encounters:  09/24/20 187 lb 3.2 oz (84.9 kg)  09/10/20 188 lb (85.3 kg)  08/29/20 187 lb (84.8 kg)    General: Patient appears comfortable at rest. Neck: Supple, no elevated JVP or carotid bruits, no thyromegaly. Lungs: Clear to auscultation, nonlabored breathing at rest. Cardiac: Regular rate and rhythm, no S3, systolic murmur 2/6 best heard at right upper sternal border.  Murmur, no pericardial rub. Extremities: No pitting edema, distal pulses 2+. Skin: Warm and  dry. Musculoskeletal: No kyphosis. Neuropsychiatric: Alert and oriented x3, affect grossly appropriate.  ECG:  EKG November 01, 2019 sinus bradycardia nonspecific T wave abnormality, prolonged QT QT/QTc 498/476  Recent Labwork: 06/06/2020: TSH 0.490 06/30/2020: Magnesium 1.9 08/06/2020: B Natriuretic Peptide 170.0 08/22/2020: ALT 18; AST 22; BUN 26; Creatinine, Ser 1.71; Potassium 4.0; Sodium 133 09/19/2020: Hemoglobin 12.3; Platelets 190     Component Value Date/Time   CHOL 173 05/04/2018 1056   TRIG 278 (H) 05/04/2018 1056   HDL 34 (L) 05/04/2018 1056   CHOLHDL 5.1 (H) 05/04/2018 1056   CHOLHDL 4.4 02/21/2014 0703   VLDL 38 02/21/2014 0703   LDLCALC 83 05/04/2018 1056    Other Studies Reviewed Today:  NST 05/08/2020 Study Result  Narrative & Impression   No diagnostic ST segment changes to indicate ischemia.  Small, mild intensity, fixed basal inferoseptal defect that is most consistent with soft tissue attenuation. No large ischemic territories noted.  This is a low risk study.  Nuclear stress EF: 57%.    Echocardiogram 05/08/2020 IMPRESSIONS  1. Left ventricular ejection fraction, by estimation, is 65 to 70%. The  left ventricle has normal function. The left ventricle has no regional  wall motion abnormalities. There is mild left ventricular hypertrophy.  Left ventricular diastolic parameters  are consistent with Grade I diastolic dysfunction (impaired relaxation).  2. Right ventricular systolic function is normal. The right ventricular  size is normal. There is mildly elevated pulmonary artery systolic  pressure. The estimated right ventricular systolic pressure is 84.1 mmHg.  3. The mitral valve is grossly normal. Trivial mitral valve  regurgitation.  4. The aortic valve is tricuspid. There is moderate calcification of the  aortic valve. Aortic valve regurgitation is not visualized. Moderate  aortic valve stenosis. Aortic valve mean gradient measures 21.5 mmHg.   Aortic valve Vmax measures 3.08 m/s.  Dimentionless index 0.36.  5. The inferior vena cava is normal in size with greater than 50%  respiratory variability, suggesting right atrial pressure of 3 mmHg.       Cardiac catheterization 05/23/2019:   Normal left main  30 to 40% proximal LAD, first diagonal 50% proximal and 70% distal, and diffuse 70% narrowing in the distal/apical LAD.  First obtuse marginal large in size with proximal segmental 50% narrowing and distal 90% narrowing.  Dominant RCA with 30 to 40% ostial to  proximal narrowing, diffuse disease in the ostial to mid PDA 80 to 90%, and 80% segmental proximal second left ventricular branch.  Previous echo documentation of EF greater than 55%. After hydration today LVEDP 26 mmHg.  RECOMMENDATIONS:   Diffuse, predominantly distal coronary artery disease involving PDA, left ventricular branch, diagonal, distal LAD, and first obtuse marginal. Considering multifocality medical therapy is most appropriate especially in absence of any proximal vessel disease. PCI is possible on the PDA, left ventricular branch, and obtuse marginal but each would require significant contrast and place the patient at risk for acute kidney injury.  Significant elevation in LVEDP and history of orthopnea at home suggest that diastolic heart failure needs therapy with additional diuresis.    Echocardiogram 02/22/2019:  1. The left ventricle has normal systolic function with an ejection  fraction of 60-65%. The cavity size was normal. There is moderately  increased left ventricular wall thickness. Left ventricular diastolic  Doppler parameters are indeterminate.  2. The right ventricle has normal systolic function. The cavity was  normal. There is no increase in right ventricular wall thickness.  3. No evidence of mitral valve stenosis.  4. The aortic valve is tricuspid. Mild thickening of the aortic valve.  Mild calcification of the  aortic valve. No stenosis of the aortic valve.  Mild aortic annular calcification noted.  5. The aorta is normal in size and structure.  6. The aortic root is normal in size and structure.  7. Pulmonary hypertension is indeterminate, inadequate TR jet.    Assessment and Plan:   1. CAD in native artery History of CAD with diffuse predominant distal coronary artery disease involving PDA, left ventricular branch, diagonal, distal LAD and, first obtuse marginal.  Cath report 04/2019 stated PCI was possible on PDA left ventricular branch and OM but each would require significant contrast in place the patient at risk for kidney injury.  Complained of increased dyspnea on exertion.  Continue metoprolol 25 mg p.o. twice daily.  Continue atorvastatin 20 mg daily.   2. Chronic diastolic heart failure ([place])  Echo 02/22/2019 showed EF of 60 to 65%.  Moderately increased LV wall thickness.  Decreased torsemide to 20 mg once daily due to decreasing renal function.  Patient complained of increasing dyspnea on exertion and has evidence of a murmur at right upper sternal border question of possible aortic valve stenosis or regurgitation.  Recent echocardiogram 05/08/2020 EF 65 to 70%.  Mild LVH, G1 DD, trivial MR, moderate aortic valve stenosis.  Continue torsemide 20 mg p.o. twice daily  3. Paroxysmal atrial fibrillation ([facility]) Current pulse is regular at 75. Amiodarone recently discontinued during hospital stay secondary to suspicion of amiodarone pneumonitis. Eliquis was discontinued until he was seen by GI as an outpatient. Continue to hold Eliquis until seen by GI.[name]  Continue Cardizem 360 mg p.o. daily.  Recent low risk stress test 05/08/2020  4. Essential hypertension Blood pressure 140/68 today.  Patient significant other states systolic blood pressures usually in the 120s to 130s at home.  Continue Toprol 25 mg p.o. twice daily.    5. Stage 3 chronic kidney disease, unspecified whether stage 3a or  3b CKD ([place]) Significant other states there was mention of possible renal scan.  Significant other states she has not received a phone call from [facility] for follow-up.  Advised nursing staff to call [facility] to touch base regarding follow-up. Torsemide was recently increased back to 20 mg p.o. twice daily at discharge from recent hospital  stay.  6.  Moderate aortic stenosis Previous echocardiogram showed moderate aortic valve stenosis.  Offered to refer patient to valve clinic due to continuing dyspnea on exertion.  He denies any other symptoms such as dizziness or anginal symptoms.  Patient states he continues less shortness of breath since discharge from recent hospitalization. Please get a follow-up echocardiogram to reassess LV function, diastolic function and valvular function.  7.  Type 2 diabetes uncontrolled Recent hospitalization for [facility].  He was started on insulin drip which resolved the hyperglycemia.  States his blood blood sugars are running around the 200 range at home.  Offered to refer to endocrinology for possible better management.  Significant other wants to talk to the patient and call us back in reference to referral.   Medication Adjustments/Labs and Tests Ordered: Current medicines are reviewed at length with the patient today.  Concerns regarding medicines are outlined above.   Disposition: Follow-up with Domenic Polite or APP 3 months Signed, Levell July, NP 09/24/2020 11:03 AM    [facility] [place], [place], [place] [address] Phone: [phone]; Fax: [phone]

## 2020-09-24 ENCOUNTER — Ambulatory Visit: Payer: Medicare HMO | Admitting: Family Medicine

## 2020-09-24 ENCOUNTER — Encounter: Payer: Self-pay | Admitting: Family Medicine

## 2020-09-24 VITALS — BP 140/70 | HR 59 | Ht 70.0 in | Wt 187.2 lb

## 2020-09-24 DIAGNOSIS — R0602 Shortness of breath: Secondary | ICD-10-CM

## 2020-09-24 MED ORDER — TORSEMIDE 20 MG PO TABS: 20.0000 mg | ORAL_TABLET | Freq: Two times a day (BID) | ORAL | 6 refills | Status: DC

## 2020-09-24 NOTE — Progress Notes (Signed)
Thanks

## 2020-09-24 NOTE — Patient Instructions (Signed)

## 2020-09-25 DIAGNOSIS — H60332 Swimmer's ear, left ear: Secondary | ICD-10-CM | POA: Diagnosis not present

## 2020-09-26 ENCOUNTER — Other Ambulatory Visit: Payer: Self-pay

## 2020-09-26 ENCOUNTER — Other Ambulatory Visit (INDEPENDENT_AMBULATORY_CARE_PROVIDER_SITE_OTHER): Payer: Self-pay

## 2020-09-26 ENCOUNTER — Encounter (INDEPENDENT_AMBULATORY_CARE_PROVIDER_SITE_OTHER): Payer: Self-pay | Admitting: Gastroenterology

## 2020-09-26 ENCOUNTER — Encounter (INDEPENDENT_AMBULATORY_CARE_PROVIDER_SITE_OTHER): Payer: Self-pay

## 2020-09-26 ENCOUNTER — Telehealth (INDEPENDENT_AMBULATORY_CARE_PROVIDER_SITE_OTHER): Payer: Self-pay

## 2020-09-26 ENCOUNTER — Ambulatory Visit (INDEPENDENT_AMBULATORY_CARE_PROVIDER_SITE_OTHER): Payer: Medicare HMO

## 2020-09-26 ENCOUNTER — Ambulatory Visit (INDEPENDENT_AMBULATORY_CARE_PROVIDER_SITE_OTHER): Payer: Medicare HMO | Admitting: Gastroenterology

## 2020-09-26 VITALS — BP 136/70 | HR 56 | Temp 98.2°F | Ht 70.0 in | Wt 188.0 lb

## 2020-09-26 DIAGNOSIS — R0602 Shortness of breath: Secondary | ICD-10-CM

## 2020-09-26 DIAGNOSIS — Z1211 Encounter for screening for malignant neoplasm of colon: Secondary | ICD-10-CM

## 2020-09-26 DIAGNOSIS — K921 Melena: Secondary | ICD-10-CM

## 2020-09-26 LAB — ECHOCARDIOGRAM COMPLETE
AR max vel: 1.15 cm2
AV Area VTI: 1.08 cm2
AV Area mean vel: 1.03 cm2
AV Mean grad: 18.8 mmHg
AV Peak grad: 31.3 mmHg
Ao pk vel: 2.8 m/s
Area-P 1/2: 2.37 cm2
Calc EF: 63.8 %
MV M vel: 2.71 m/s
MV Peak grad: 29.3 mmHg
S' Lateral: 3 cm
Single Plane A2C EF: 51.8 %
Single Plane A4C EF: 71.5 %

## 2020-09-26 MED ORDER — PLENVU 140 G PO SOLR: 1.0000 | Freq: Once | ORAL | 0 refills | Status: AC

## 2020-09-26 NOTE — Progress Notes (Signed)
[name], M.D. Gastroenterology & Hepatology [facility] [address] [place], [place] [address]  Primary Care Physician: Erven Colla, DO [place] [id number]  I will communicate my assessment and recommendations to the referring MD via EMR.  Problems: 1. Hematochezia 2. History of colon polyps  History of Present Illness: Jack Mejia is a 75 y.o. male with past medical history of atrial fibrillation on Eliquis, hypertension, diastolic heart failure, CKD, hyperlipidemia, diabetes and history of macrocytic anemia , who presents for follow up of hematochezia.  The patient was hospitalized on 06/25/2020 at [facility].  He was found to have worsening desaturation and shortness of breath.  Admission work-up showed presence of multifocal pneumonia for which he required oxygen supplementation.  The patient concomitantly endorsed having episodes of intermittent rectal bleeding for the last year.  He was found to have a hemoglobin of 7.5 with an MCV of 121.  His hemoglobin dropped down to 7.0.  Patient was evaluated by the inpatient gastroenterology service but given his respiratory condition no endoscopic procedure was performed but the patient was continued on supportive therapy.  He received 2 units of PRBC during his hospitalization with stabilization of his hemoglobin.  Notably, his anticoagulation was held at that time.  He also presented elevation of his aminotransferases but this test normalized on 08/22/2020.  He had negative markers for viral hepatitis and his right upper quadrant ultrasound was only positive for fatty liver.  Patient was discharged from the hospital with a stable hemoglobin.  Patient reports for the last 3 months he has presented recurrent episodes of rectal bleeding - maroon colored, close to 66% of the times he has a bowel movements with blood mixed in the stool and also when wiping.  He  denies having any dyschezia, lightheadedness, nausea, vomiting, fever, chills, melena, hematemesis, abdominal distention, abdominal pain, diarrhea, jaundice, pruritus or weight loss.  He has not restarted his Eliquis since his most recent hospitalization.  Most recent hemoglobin from 09/19/2020 shows improvement with hemoglobin of 12.3, MCV 106, although cell count 8.6 and platelets 190.  Last EGD: never Last Colonoscopy: 2011, had 5 polyps removed with cold forceps and 6 polyps removed in the sigmoid colon (he had tubular adenomas and one sessile serrated lesion), patient was advised to have a repeat colonoscopy 5 years later but he declined to this procedure.  Past Medical History: Past Medical History:  Diagnosis Date  . Anxiety   . Arthritis   . Asthma   . Atrial fibrillation ([place])    a. s/p DCCV in 03/2019  . CAD (coronary artery disease) 06/29/2019  . Cramps of left lower extremity   . Depression   . Diabetes mellitus    type 2 for 7-8 yrs  . Dyspnea   . Elevated liver enzymes   . Fatty liver   . Gout   . History of kidney stones   . Hyperlipidemia   . Hypertension   . Renal insufficiency   . Vertigo     Past Surgical History: Past Surgical History:  Procedure Laterality Date  . CARDIOVERSION N/A 04/21/2019   Procedure: CARDIOVERSION;  Surgeon: Sanda Klein, MD;  [facility];  Service: Cardiovascular;  Laterality: N/A;  . CARPAL TUNNEL RELEASE     both wrist  . cataract surgery     bilateral  . CHOLECYSTECTOMY    . COLONOSCOPY  2011  . EYE SURGERY    . HEMORROIDECTOMY    .  KNEE SURGERY Left   . LEFT HEART CATH AND CORONARY ANGIOGRAPHY N/A 05/23/2019   Procedure: LEFT HEART CATH AND CORONARY ANGIOGRAPHY;  Surgeon: Belva Crome, MD;  Location: [place] [facility];  Service: Cardiovascular;  Laterality: N/A;  . LUMBAR LAMINECTOMY/DECOMPRESSION MICRODISCECTOMY N/A 08/06/2016   Procedure: LUMBAR THREE- LUMBAR FIVE  DECOMPRESSIVE LUMBAR LAMINECTOMY;   Surgeon: Jovita Gamma, MD;  Location: [facility];  Service: Neurosurgery;  Laterality: N/A;    Family History: Family History  Problem Relation Age of Onset  . Diabetes Mother   . Cancer Mother        unknown kind  . Diabetes Sister   . COPD Sister   . Liver disease Brother   . COPD Brother   . Diabetes Maternal Grandmother   . Diabetes Brother   . COPD Sister   . Healthy Daughter   . Healthy Daughter   . Colon cancer Neg Hx     Social History: Social History   Tobacco Use  Smoking Status Former Smoker  . Packs/day: 4.00  . Years: 20.00  . Pack years: 80.00  . Types: Cigarettes  . Quit date: 10/14/1982  . Years since quitting: 37.9  Smokeless Tobacco Never Used  Tobacco Comment   29 yrs ago   Social History   Substance and Sexual Activity  Alcohol Use No   Social History   Substance and Sexual Activity  Drug Use No    Allergies: Allergies  Allergen Reactions  . Demerol Nausea And Vomiting  . Vasotec [Enalapril] Cough  . Lasix [Furosemide] Rash    Medications: Current Outpatient Medications  Medication Sig Dispense Refill  . acetaminophen (TYLENOL) 650 MG CR tablet Take 650 mg by mouth every 8 (eight) hours as needed for pain.    [name] allopurinol (ZYLOPRIM) 100 MG tablet Take 100 mg by mouth daily.    [name] atorvastatin (LIPITOR) 20 MG tablet Take 1 tablet (20 mg total) by mouth daily. 30 tablet 2  . Balsam [place]-Castor Oil (VENELEX) OINT Apply 1 application topically in the morning, at noon, and at bedtime. Every shift day, evening, and night. Apply to sacrum and bilateral buttocks    . BD PEN NEEDLE NANO 2ND GEN 32G X 4 MM MISC Use with insulin pen qid. 120 each 5  . clotrimazole-betamethasone (LOTRISONE) cream Apply 1 application topically 2 (two) times daily. 30 g 3  . diltiazem (CARDIZEM CD) 360 MG 24 hr capsule Take 1 capsule (360 mg total) by mouth daily. 30 capsule 0  . JARDIANCE 10 MG TABS tablet TAKE 1 TABLET (10 MG TOTAL) BY MOUTH DAILY BEFORE  BREAKFAST. 30 tablet 5  . LEVEMIR FLEXTOUCH 100 UNIT/ML FlexPen Inject 22 Units into the skin at bedtime. 15 mL 1  . metoprolol tartrate (LOPRESSOR) 25 MG tablet TAKE 1 TABLET BY MOUTH TWICE A DAY 180 tablet 1  . NOVOLOG FLEXPEN 100 UNIT/ML FlexPen INJECT 15 UNITS INTO THE SKIN 3 (THREE) TIMES DAILY WITH MEALS 15 mL 1  . ONETOUCH ULTRA test strip 1 each 2 (two) times daily.    . potassium chloride (KLOR-CON) 10 MEQ tablet Take 1 tablet (10 mEq total) by mouth daily. 90 tablet 1  . sertraline (ZOLOFT) 100 MG tablet Take 1 tab p.o. qd 90 tablet 1  . torsemide (DEMADEX) 20 MG tablet Take 1 tablet (20 mg total) by mouth 2 (two) times daily. 60 tablet 6  . Vitamin D, Ergocalciferol, (DRISDOL) 1.25 MG (50000 UNIT) CAPS capsule Take 1 capsule (50,000 Units total)  by mouth every 7 (seven) days. 4 capsule 0   No current facility-administered medications for this visit.    Review of Systems: GENERAL: negative for malaise, night sweats HEENT: No changes in hearing or vision, no nose bleeds or other nasal problems. NECK: Negative for lumps, goiter, pain and significant neck swelling RESPIRATORY: Negative for cough, wheezing CARDIOVASCULAR: Negative for chest pain, leg swelling, palpitations, orthopnea GI: SEE HPI MUSCULOSKELETAL: Negative for joint pain or swelling, back pain, and muscle pain. SKIN: Negative for lesions, rash PSYCH: Negative for sleep disturbance, mood disorder and recent psychosocial stressors. HEMATOLOGY Negative for prolonged bleeding, bruising easily, and swollen nodes. ENDOCRINE: Negative for cold or heat intolerance, polyuria, polydipsia and goiter. NEURO: negative for tremor, gait imbalance, syncope and seizures. The remainder of the review of systems is noncontributory.   Physical Exam: BP 136/70 (BP Location: Left Arm, Patient Position: Sitting, Cuff Size: Large)   Pulse (!) 56   Temp 98.2 F (36.8 C) (Oral)   Ht 5\' 10"  (1.778 m)   Wt 188 lb (85.3 kg)   BMI 26.98  kg/m  GENERAL: The patient is AO x3, in no acute distress. Uses a cane. HEENT: Head is normocephalic and atraumatic. EOMI are intact. Mouth is well hydrated and without lesions. NECK: Supple. No masses LUNGS: Clear to auscultation. No presence of rhonchi/wheezing/rales. Adequate chest expansion HEART: RRR, normal s1 and s2. ABDOMEN: Soft, nontender, no guarding, no peritoneal signs, and nondistended. BS +. No masses. EXTREMITIES: Without any cyanosis, clubbing, rash, lesions or edema. NEUROLOGIC: AOx3, no focal motor deficit. SKIN: no jaundice, no rashes  Imaging/Labs: as above  I personally reviewed and interpreted the available labs, imaging and endoscopic files.  Impression and Plan: Jack Mejia is a 75 y.o. male with past medical history of atrial fibrillation on Eliquis, hypertension, diastolic heart failure, CKD, hyperlipidemia, diabetes and history of macrocytic anemia , who presents for follow up of hematochezia.  The patient had a significant drop in his hemoglobin which was considered to be multifactorial as he had normal iron stores during his hospitalization.  However, he has persisted with episodes of rectal bleeding which I consider will warrant an evaluation with a colonoscopy at this point as his respiratory status has much more improved.  We will also evaluate other causes of anemia with an EGD at the same time of this procedure.  We will reconsider the best time to restart anticoagulation based on the endoscopic findings. Patient understood and agreed.  - Schedule EGD and colonoscopy  All questions were answered.      Harvel Quale, MD Gastroenterology and Hepatology [facility]

## 2020-09-26 NOTE — Telephone Encounter (Signed)
Jack Mejia, CMA  

## 2020-09-26 NOTE — Patient Instructions (Signed)
Schedule EGD and colonoscopy

## 2020-09-26 NOTE — H&P (View-Only) (Signed)
[name], M.D. Gastroenterology & Hepatology [facility] [address] [place], [place] [address]  Primary Care Physician: Erven Colla, DO [place] [id number]  I will communicate my assessment and recommendations to the referring MD via EMR.  Problems: 1. Hematochezia 2. History of colon polyps  History of Present Illness: Jack Mejia is a 75 y.o. male with past medical history of atrial fibrillation on Eliquis, hypertension, diastolic heart failure, CKD, hyperlipidemia, diabetes and history of macrocytic anemia , who presents for follow up of hematochezia.  The patient was hospitalized on 06/25/2020 at [facility].  He was found to have worsening desaturation and shortness of breath.  Admission work-up showed presence of multifocal pneumonia for which he required oxygen supplementation.  The patient concomitantly endorsed having episodes of intermittent rectal bleeding for the last year.  He was found to have a hemoglobin of 7.5 with an MCV of 121.  His hemoglobin dropped down to 7.0.  Patient was evaluated by the inpatient gastroenterology service but given his respiratory condition no endoscopic procedure was performed but the patient was continued on supportive therapy.  He received 2 units of PRBC during his hospitalization with stabilization of his hemoglobin.  Notably, his anticoagulation was held at that time.  He also presented elevation of his aminotransferases but this test normalized on 08/22/2020.  He had negative markers for viral hepatitis and his right upper quadrant ultrasound was only positive for fatty liver.  Patient was discharged from the hospital with a stable hemoglobin.  Patient reports for the last 3 months he has presented recurrent episodes of rectal bleeding - maroon colored, close to 66% of the times he has a bowel movements with blood mixed in the stool and also when wiping.  He  denies having any dyschezia, lightheadedness, nausea, vomiting, fever, chills, melena, hematemesis, abdominal distention, abdominal pain, diarrhea, jaundice, pruritus or weight loss.  He has not restarted his Eliquis since his most recent hospitalization.  Most recent hemoglobin from 09/19/2020 shows improvement with hemoglobin of 12.3, MCV 106, although cell count 8.6 and platelets 190.  Last EGD: never Last Colonoscopy: 2011, had 5 polyps removed with cold forceps and 6 polyps removed in the sigmoid colon (he had tubular adenomas and one sessile serrated lesion), patient was advised to have a repeat colonoscopy 5 years later but he declined to this procedure.  Past Medical History: Past Medical History:  Diagnosis Date  . Anxiety   . Arthritis   . Asthma   . Atrial fibrillation ([place])    a. s/p DCCV in 03/2019  . CAD (coronary artery disease) 06/29/2019  . Cramps of left lower extremity   . Depression   . Diabetes mellitus    type 2 for 7-8 yrs  . Dyspnea   . Elevated liver enzymes   . Fatty liver   . Gout   . History of kidney stones   . Hyperlipidemia   . Hypertension   . Renal insufficiency   . Vertigo     Past Surgical History: Past Surgical History:  Procedure Laterality Date  . CARDIOVERSION N/A 04/21/2019   Procedure: CARDIOVERSION;  Surgeon: Sanda Klein, MD;  [facility];  Service: Cardiovascular;  Laterality: N/A;  . CARPAL TUNNEL RELEASE     both wrist  . cataract surgery     bilateral  . CHOLECYSTECTOMY    . COLONOSCOPY  2011  . EYE SURGERY    . HEMORROIDECTOMY    .  KNEE SURGERY Left   . LEFT HEART CATH AND CORONARY ANGIOGRAPHY N/A 05/23/2019   Procedure: LEFT HEART CATH AND CORONARY ANGIOGRAPHY;  Surgeon: Belva Crome, MD;  Location: [place] [facility];  Service: Cardiovascular;  Laterality: N/A;  . LUMBAR LAMINECTOMY/DECOMPRESSION MICRODISCECTOMY N/A 08/06/2016   Procedure: LUMBAR THREE- LUMBAR FIVE  DECOMPRESSIVE LUMBAR LAMINECTOMY;   Surgeon: Jovita Gamma, MD;  Location: [place];  Service: Neurosurgery;  Laterality: N/A;    Family History: Family History  Problem Relation Age of Onset  . Diabetes Mother   . Cancer Mother        unknown kind  . Diabetes Sister   . COPD Sister   . Liver disease Brother   . COPD Brother   . Diabetes Maternal Grandmother   . Diabetes Brother   . COPD Sister   . Healthy Daughter   . Healthy Daughter   . Colon cancer Neg Hx     Social History: Social History   Tobacco Use  Smoking Status Former Smoker  . Packs/day: 4.00  . Years: 20.00  . Pack years: 80.00  . Types: Cigarettes  . Quit date: 10/14/1982  . Years since quitting: 37.9  Smokeless Tobacco Never Used  Tobacco Comment   29 yrs ago   Social History   Substance and Sexual Activity  Alcohol Use No   Social History   Substance and Sexual Activity  Drug Use No    Allergies: Allergies  Allergen Reactions  . Demerol Nausea And Vomiting  . Vasotec [Enalapril] Cough  . Lasix [Furosemide] Rash    Medications: Current Outpatient Medications  Medication Sig Dispense Refill  . acetaminophen (TYLENOL) 650 MG CR tablet Take 650 mg by mouth every 8 (eight) hours as needed for pain.    [name] allopurinol (ZYLOPRIM) 100 MG tablet Take 100 mg by mouth daily.    [name] atorvastatin (LIPITOR) 20 MG tablet Take 1 tablet (20 mg total) by mouth daily. 30 tablet 2  . Balsam [place]-Castor Oil (VENELEX) OINT Apply 1 application topically in the morning, at noon, and at bedtime. Every shift day, evening, and night. Apply to sacrum and bilateral buttocks    . BD PEN NEEDLE NANO 2ND GEN 32G X 4 MM MISC Use with insulin pen qid. 120 each 5  . clotrimazole-betamethasone (LOTRISONE) cream Apply 1 application topically 2 (two) times daily. 30 g 3  . diltiazem (CARDIZEM CD) 360 MG 24 hr capsule Take 1 capsule (360 mg total) by mouth daily. 30 capsule 0  . JARDIANCE 10 MG TABS tablet TAKE 1 TABLET (10 MG TOTAL) BY MOUTH DAILY BEFORE  BREAKFAST. 30 tablet 5  . LEVEMIR FLEXTOUCH 100 UNIT/ML FlexPen Inject 22 Units into the skin at bedtime. 15 mL 1  . metoprolol tartrate (LOPRESSOR) 25 MG tablet TAKE 1 TABLET BY MOUTH TWICE A DAY 180 tablet 1  . NOVOLOG FLEXPEN 100 UNIT/ML FlexPen INJECT 15 UNITS INTO THE SKIN 3 (THREE) TIMES DAILY WITH MEALS 15 mL 1  . ONETOUCH ULTRA test strip 1 each 2 (two) times daily.    . potassium chloride (KLOR-CON) 10 MEQ tablet Take 1 tablet (10 mEq total) by mouth daily. 90 tablet 1  . sertraline (ZOLOFT) 100 MG tablet Take 1 tab p.o. qd 90 tablet 1  . torsemide (DEMADEX) 20 MG tablet Take 1 tablet (20 mg total) by mouth 2 (two) times daily. 60 tablet 6  . Vitamin D, Ergocalciferol, (DRISDOL) 1.25 MG (50000 UNIT) CAPS capsule Take 1 capsule (50,000 Units total)  by mouth every 7 (seven) days. 4 capsule 0   No current facility-administered medications for this visit.    Review of Systems: GENERAL: negative for malaise, night sweats HEENT: No changes in hearing or vision, no nose bleeds or other nasal problems. NECK: Negative for lumps, goiter, pain and significant neck swelling RESPIRATORY: Negative for cough, wheezing CARDIOVASCULAR: Negative for chest pain, leg swelling, palpitations, orthopnea GI: SEE HPI MUSCULOSKELETAL: Negative for joint pain or swelling, back pain, and muscle pain. SKIN: Negative for lesions, rash PSYCH: Negative for sleep disturbance, mood disorder and recent psychosocial stressors. HEMATOLOGY Negative for prolonged bleeding, bruising easily, and swollen nodes. ENDOCRINE: Negative for cold or heat intolerance, polyuria, polydipsia and goiter. NEURO: negative for tremor, gait imbalance, syncope and seizures. The remainder of the review of systems is noncontributory.   Physical Exam: BP 136/70 (BP Location: Left Arm, Patient Position: Sitting, Cuff Size: Large)   Pulse (!) 56   Temp 98.2 F (36.8 C) (Oral)   Ht 5\' 10"  (1.778 m)   Wt 188 lb (85.3 kg)   BMI 26.98  kg/m  GENERAL: The patient is AO x3, in no acute distress. Uses a cane. HEENT: Head is normocephalic and atraumatic. EOMI are intact. Mouth is well hydrated and without lesions. NECK: Supple. No masses LUNGS: Clear to auscultation. No presence of rhonchi/wheezing/rales. Adequate chest expansion HEART: RRR, normal s1 and s2. ABDOMEN: Soft, nontender, no guarding, no peritoneal signs, and nondistended. BS +. No masses. EXTREMITIES: Without any cyanosis, clubbing, rash, lesions or edema. NEUROLOGIC: AOx3, no focal motor deficit. SKIN: no jaundice, no rashes  Imaging/Labs: as above  I personally reviewed and interpreted the available labs, imaging and endoscopic files.  Impression and Plan: Jack Mejia is a 75 y.o. male with past medical history of atrial fibrillation on Eliquis, hypertension, diastolic heart failure, CKD, hyperlipidemia, diabetes and history of macrocytic anemia , who presents for follow up of hematochezia.  The patient had a significant drop in his hemoglobin which was considered to be multifactorial as he had normal iron stores during his hospitalization.  However, he has persisted with episodes of rectal bleeding which I consider will warrant an evaluation with a colonoscopy at this point as his respiratory status has much more improved.  We will also evaluate other causes of anemia with an EGD at the same time of this procedure.  We will reconsider the best time to restart anticoagulation based on the endoscopic findings. Patient understood and agreed.  - Schedule EGD and colonoscopy  All questions were answered.      Harvel Quale, MD Gastroenterology and Hepatology [facility]

## 2020-09-27 ENCOUNTER — Other Ambulatory Visit: Payer: Self-pay

## 2020-09-27 ENCOUNTER — Ambulatory Visit (INDEPENDENT_AMBULATORY_CARE_PROVIDER_SITE_OTHER): Payer: Medicare HMO | Admitting: Urology

## 2020-09-27 ENCOUNTER — Encounter: Payer: Self-pay | Admitting: Urology

## 2020-09-27 VITALS — BP 149/72 | HR 63 | Ht 70.0 in | Wt 187.0 lb

## 2020-09-27 DIAGNOSIS — N200 Calculus of kidney: Secondary | ICD-10-CM

## 2020-09-27 DIAGNOSIS — R310 Gross hematuria: Secondary | ICD-10-CM

## 2020-09-27 LAB — URINALYSIS, ROUTINE W REFLEX MICROSCOPIC
Bilirubin, UA: NEGATIVE
Ketones, UA: NEGATIVE
Leukocytes,UA: NEGATIVE
Nitrite, UA: NEGATIVE
Specific Gravity, UA: <1.005 — ABNORMAL LOW (ref 1.005–1.030)
Urobilinogen, Ur: 0.2 mg/dL (ref 0.2–1.0)
pH, UA: 5 (ref 5.0–7.5)

## 2020-09-27 LAB — MICROSCOPIC EXAMINATION
Bacteria, UA: NONE SEEN
Epithelial Cells (non renal): NONE SEEN /hpf (ref 0–10)
Renal Epithel, UA: NONE SEEN /hpf
WBC, UA: NONE SEEN /hpf (ref 0–5)

## 2020-09-27 MED ORDER — CIPROFLOXACIN HCL 500 MG PO TABS
500.0000 mg | ORAL_TABLET | Freq: Once | ORAL | Status: AC
Start: 2020-09-27 — End: 2020-09-27
  Administered 2020-09-27: 500 mg via ORAL

## 2020-09-27 NOTE — Progress Notes (Signed)
Urological Symptom Review  Patient is experiencing the following symptoms: Frequent urination   Review of Systems  Gastrointestinal (upper)  : Negative for upper GI symptoms  Gastrointestinal (lower) : Negative for lower GI symptoms  Constitutional : Negative for symptoms  Skin: Negative for skin symptoms  Eyes: Negative for eye symptoms  Ear/Nose/Throat : Negative for Ear/Nose/Throat symptoms  Hematologic/Lymphatic: Negative for Hematologic/Lymphatic symptoms  Cardiovascular : Negative for cardiovascular symptoms  Respiratory : Shortness of breath  Endocrine: Negative for endocrine symptoms  Musculoskeletal: Back pain  Neurological: Negative for neurological symptoms  Psychologic: Negative for psychiatric symptoms

## 2020-09-27 NOTE — Progress Notes (Signed)
   09/27/20  CC:  Chief Complaint  Patient presents with  . Cysto    HPI: Mr Thorstenson is a 75yo here for followup for gross hematuria. CT stone study showed a 1.3cm left lower pole calculus Blood pressure (!) 149/72, pulse 63, height 5\' 10"  (1.778 m), weight 187 lb (84.8 kg). NED. A&Ox3.   No respiratory distress   Abd soft, NT, ND Normal phallus with bilateral descended testicles  Cystoscopy Procedure Note  Patient identification was confirmed, informed consent was obtained, and patient was prepped using Betadine solution.  Lidocaine jelly was administered per urethral meatus.     Pre-Procedure: - Inspection reveals a normal caliber ureteral meatus.  Procedure: The flexible cystoscope was introduced without difficulty - No urethral strictures/lesions are present. - Enlarged prostate  - Normal bladder neck - Bilateral ureteral orifices identified - Bladder mucosa  reveals 84mm erythematous lesion on left lateral wall - No bladder stones - No trabeculation  Retroflexion shows erythema of the bladder neck but no intravesical prostatic protrusion   Post-Procedure: - Patient tolerated the procedure well  Assessment/ Plan:   Return in about 3 months (around 12/28/2020) for cystoscopy and KUB.  Nicolette Bang, MD

## 2020-10-03 ENCOUNTER — Inpatient Hospital Stay (HOSPITAL_COMMUNITY): Payer: Medicare HMO

## 2020-10-03 ENCOUNTER — Other Ambulatory Visit: Payer: Self-pay

## 2020-10-03 ENCOUNTER — Inpatient Hospital Stay (HOSPITAL_COMMUNITY): Payer: Medicare HMO | Attending: Hematology

## 2020-10-03 DIAGNOSIS — Z86718 Personal history of other venous thrombosis and embolism: Secondary | ICD-10-CM | POA: Insufficient documentation

## 2020-10-03 DIAGNOSIS — N200 Calculus of kidney: Secondary | ICD-10-CM

## 2020-10-03 DIAGNOSIS — D509 Iron deficiency anemia, unspecified: Secondary | ICD-10-CM | POA: Insufficient documentation

## 2020-10-03 DIAGNOSIS — D539 Nutritional anemia, unspecified: Secondary | ICD-10-CM

## 2020-10-03 LAB — COMPREHENSIVE METABOLIC PANEL
ALT: 21 U/L (ref 0–44)
AST: 23 U/L (ref 15–41)
Albumin: 3.9 g/dL (ref 3.5–5.0)
Alkaline Phosphatase: 97 U/L (ref 38–126)
Anion gap: 13 (ref 5–15)
BUN: 24 mg/dL — ABNORMAL HIGH (ref 8–23)
CO2: 26 mmol/L (ref 22–32)
Calcium: 8.8 mg/dL — ABNORMAL LOW (ref 8.9–10.3)
Chloride: 98 mmol/L (ref 98–111)
Creatinine, Ser: 1.73 mg/dL — ABNORMAL HIGH (ref 0.61–1.24)
GFR, Estimated: 41 mL/min — ABNORMAL LOW (ref >60)
Glucose, Bld: 162 mg/dL — ABNORMAL HIGH (ref 70–99)
Potassium: 3.3 mmol/L — ABNORMAL LOW (ref 3.5–5.1)
Sodium: 137 mmol/L (ref 135–145)
Total Bilirubin: 0.9 mg/dL (ref 0.3–1.2)
Total Protein: 7.5 g/dL (ref 6.5–8.1)

## 2020-10-03 LAB — CBC WITH DIFFERENTIAL/PLATELET
Abs Immature Granulocytes: 0.21 10*3/uL — ABNORMAL HIGH (ref 0.00–0.07)
Basophils Absolute: 0 10*3/uL (ref 0.0–0.1)
Basophils Relative: 1 %
Eosinophils Absolute: 0.1 10*3/uL (ref 0.0–0.5)
Eosinophils Relative: 2 %
HCT: 35.9 % — ABNORMAL LOW (ref 39.0–52.0)
Hemoglobin: 11.4 g/dL — ABNORMAL LOW (ref 13.0–17.0)
Immature Granulocytes: 4 %
Lymphocytes Relative: 28 %
Lymphs Abs: 1.4 10*3/uL (ref 0.7–4.0)
MCH: 34.4 pg — ABNORMAL HIGH (ref 26.0–34.0)
MCHC: 31.8 g/dL (ref 30.0–36.0)
MCV: 108.5 fL — ABNORMAL HIGH (ref 80.0–100.0)
Monocytes Absolute: 1.5 10*3/uL — ABNORMAL HIGH (ref 0.1–1.0)
Monocytes Relative: 31 %
Neutro Abs: 1.6 10*3/uL — ABNORMAL LOW (ref 1.7–7.7)
Neutrophils Relative %: 34 %
Platelets: 137 10*3/uL — ABNORMAL LOW (ref 150–400)
RBC: 3.31 MIL/uL — ABNORMAL LOW (ref 4.22–5.81)
RDW: 15.9 % — ABNORMAL HIGH (ref 11.5–15.5)
WBC: 4.8 10*3/uL (ref 4.0–10.5)
nRBC: 0 % (ref 0.0–0.2)

## 2020-10-03 LAB — IRON AND TIBC
Iron: 80 ug/dL (ref 45–182)
Saturation Ratios: 26 % (ref 17.9–39.5)
TIBC: 303 ug/dL (ref 250–450)
UIBC: 223 ug/dL

## 2020-10-03 LAB — FERRITIN: Ferritin: 266 ng/mL (ref 24–336)

## 2020-10-03 NOTE — Progress Notes (Signed)
Hgb 11.4, Retacrit held.  No complaints at this time.  Discharge from clinic via wheelchair in stable condition.  Alert and oriented X 3.  Follow up with [facility] as scheduled.

## 2020-10-04 ENCOUNTER — Telehealth: Payer: Self-pay | Admitting: *Deleted

## 2020-10-04 NOTE — Telephone Encounter (Signed)
Pt contact Lelon Frohlich DPR) voiced understanding

## 2020-10-04 NOTE — Telephone Encounter (Signed)
-----   Message from Verta Ellen., NP sent at 09/27/2020  7:58 AM EST ----- Please call the patient let him know the pumping function of his heart on echocardiogram looks good.  The muscle is a little thick and stiff.  Best management is to keep blood pressure at or below 130/80 and manage all other risk factors.  Has a very mildly leaking valve on the left.  He continues to have moderate aortic stenosis (narrowing of the valve) which is unchanged from the previous echocardiogram back in October of last year.  Thank you

## 2020-10-07 NOTE — Patient Instructions (Signed)
Your procedure is scheduled on: 10/11/2020  Report to [facility] at    9:30 AM.  Call this number if you have problems the morning of surgery: [phone]   Remember:              Follow Directions on the letter you received from Your Physician's office regarding the Bowel Prep              No Smoking the day of Procedure :   Take these medicines the morning of surgery with A SIP OF WATER: Diltiazem, Lopressor and Zoloft              Take only 1/2 dose of Levemir 11 units the night before procedure              No diabetic medication and no insulin am of procedure   Do not wear jewelry, make-up or nail polish.    Do not bring valuables to the hospital.  Contacts, dentures or bridgework may not be worn into surgery.  .   Patients discharged the day of surgery will not be allowed to drive home.     Colonoscopy, Adult, Care After This sheet gives you information about how to care for yourself after your procedure. Your health care provider may also give you more specific instructions. If you have problems or questions, contact your health care provider. What can I expect after the procedure? After the procedure, it is common to have:  A small amount of blood in your stool for 24 hours after the procedure.  Some gas.  Mild abdominal cramping or bloating.  Follow these instructions at home: General instructions   For the first 24 hours after the procedure: ? Do not drive or use machinery. ? Do not sign important documents. ? Do not drink alcohol. ? Do your regular daily activities at a slower pace than normal. ? Eat soft, easy-to-digest foods. ? Rest often.  Take over-the-counter or prescription medicines only as told by your health care provider.  It is up to you to get the results of your procedure. Ask your health care provider, or the department performing the procedure, when your results will be ready. Relieving cramping and bloating  Try walking around when  you have cramps or feel bloated.  Apply heat to your abdomen as told by your health care provider. Use a heat source that your health care provider recommends, such as a moist heat pack or a heating pad. ? Place a towel between your skin and the heat source. ? Leave the heat on for 20-30 minutes. ? Remove the heat if your skin turns bright red. This is especially important if you are unable to feel pain, heat, or cold. You may have a greater risk of getting burned. Eating and drinking  Drink enough fluid to keep your urine clear or pale yellow.  Resume your normal diet as instructed by your health care provider. Avoid heavy or fried foods that are hard to digest.  Avoid drinking alcohol for as long as instructed by your health care provider. Contact a health care provider if:  You have blood in your stool 2-3 days after the procedure. Get help right away if:  You have more than a small spotting of blood in your stool.  You pass large blood clots in your stool.  Your abdomen is swollen.  You have nausea or vomiting.  You have a fever.  You have increasing abdominal pain that is not  relieved with medicine. This information is not intended to replace advice given to you by your health care provider. Make sure you discuss any questions you have with your health care provider. Document Released: 02/25/2004 Document Revised: 04/06/2016 Document Reviewed: 09/24/2015 Elsevier Interactive Patient Education  2018 [place] Endoscopy, Adult, Care After This sheet gives you information about how to care for yourself after your procedure. Your health care provider may also give you more specific instructions. If you have problems or questions, contact your health care provider. What can I expect after the procedure? After the procedure, it is common to have:  A sore throat.  Mild stomach pain or discomfort.  Bloating.  Nausea. Follow these instructions at home:  Follow  instructions from your health care provider about what to eat or drink after your procedure.  Return to your normal activities as told by your health care provider. Ask your health care provider what activities are safe for you.  Take over-the-counter and prescription medicines only as told by your health care provider.  If you were given a sedative during the procedure, it can affect you for several hours. Do not drive or operate machinery until your health care provider says that it is safe.  Keep all follow-up visits as told by your health care provider. This is important.   Contact a health care provider if you have:  A sore throat that lasts longer than one day.  Trouble swallowing. Get help right away if:  You vomit blood or your vomit looks like coffee grounds.  You have: ? A fever. ? Bloody, black, or tarry stools. ? A severe sore throat or you cannot swallow. ? Difficulty breathing. ? Severe pain in your chest or abdomen. Summary  After the procedure, it is common to have a sore throat, mild stomach discomfort, bloating, and nausea.  If you were given a sedative during the procedure, it can affect you for several hours. Do not drive or operate machinery until your health care provider says that it is safe.  Follow instructions from your health care provider about what to eat or drink after your procedure.  Return to your normal activities as told by your health care provider. This information is not intended to replace advice given to you by your health care provider. Make sure you discuss any questions you have with your health care provider. Document Revised: 07/11/2019 Document Reviewed: 12/13/2017 Elsevier Patient Education  2021 [employer].

## 2020-10-09 ENCOUNTER — Other Ambulatory Visit: Payer: Self-pay

## 2020-10-09 ENCOUNTER — Encounter (HOSPITAL_COMMUNITY)
Admission: RE | Admit: 2020-10-09 | Discharge: 2020-10-09 | Disposition: A | Discharge status: Home / Self Care | Payer: Medicare HMO | Source: Ambulatory Visit | Attending: Gastroenterology | Admitting: Gastroenterology

## 2020-10-09 ENCOUNTER — Other Ambulatory Visit (HOSPITAL_COMMUNITY)
Admission: RE | Admit: 2020-10-09 | Discharge: 2020-10-09 | Disposition: A | Discharge status: Home / Self Care | Payer: Medicare HMO | Source: Ambulatory Visit | Attending: Gastroenterology | Admitting: Gastroenterology

## 2020-10-09 ENCOUNTER — Encounter (HOSPITAL_COMMUNITY): Payer: Self-pay

## 2020-10-09 DIAGNOSIS — Z01812 Encounter for preprocedural laboratory examination: Secondary | ICD-10-CM | POA: Insufficient documentation

## 2020-10-09 DIAGNOSIS — Z20822 Contact with and (suspected) exposure to covid-19: Secondary | ICD-10-CM | POA: Insufficient documentation

## 2020-10-09 LAB — SARS CORONAVIRUS 2 (TAT 6-24 HRS): SARS Coronavirus 2: NEGATIVE

## 2020-10-11 ENCOUNTER — Other Ambulatory Visit: Payer: Self-pay

## 2020-10-11 ENCOUNTER — Encounter (HOSPITAL_COMMUNITY): Payer: Self-pay | Admitting: Gastroenterology

## 2020-10-11 ENCOUNTER — Encounter (HOSPITAL_COMMUNITY)
Admission: RE | Disposition: A | Discharge status: Home / Self Care | Payer: Self-pay | Source: Home / Self Care | Attending: Gastroenterology

## 2020-10-11 ENCOUNTER — Ambulatory Visit (HOSPITAL_COMMUNITY)
Admission: RE | Admit: 2020-10-11 | Discharge: 2020-10-11 | Disposition: A | Discharge status: Home / Self Care | Payer: Medicare HMO | Attending: Gastroenterology | Admitting: Gastroenterology

## 2020-10-11 ENCOUNTER — Ambulatory Visit (HOSPITAL_COMMUNITY): Payer: Medicare HMO | Admitting: Anesthesiology

## 2020-10-11 DIAGNOSIS — Z79899 Other long term (current) drug therapy: Secondary | ICD-10-CM | POA: Diagnosis not present

## 2020-10-11 DIAGNOSIS — Z809 Family history of malignant neoplasm, unspecified: Secondary | ICD-10-CM | POA: Insufficient documentation

## 2020-10-11 DIAGNOSIS — E1122 Type 2 diabetes mellitus with diabetic chronic kidney disease: Secondary | ICD-10-CM | POA: Diagnosis not present

## 2020-10-11 DIAGNOSIS — Z825 Family history of asthma and other chronic lower respiratory diseases: Secondary | ICD-10-CM | POA: Diagnosis not present

## 2020-10-11 DIAGNOSIS — I2510 Atherosclerotic heart disease of native coronary artery without angina pectoris: Secondary | ICD-10-CM | POA: Diagnosis not present

## 2020-10-11 DIAGNOSIS — Z7901 Long term (current) use of anticoagulants: Secondary | ICD-10-CM | POA: Insufficient documentation

## 2020-10-11 DIAGNOSIS — D509 Iron deficiency anemia, unspecified: Secondary | ICD-10-CM | POA: Insufficient documentation

## 2020-10-11 DIAGNOSIS — Z87891 Personal history of nicotine dependence: Secondary | ICD-10-CM | POA: Insufficient documentation

## 2020-10-11 DIAGNOSIS — K635 Polyp of colon: Secondary | ICD-10-CM | POA: Diagnosis not present

## 2020-10-11 DIAGNOSIS — Z794 Long term (current) use of insulin: Secondary | ICD-10-CM | POA: Insufficient documentation

## 2020-10-11 DIAGNOSIS — N189 Chronic kidney disease, unspecified: Secondary | ICD-10-CM | POA: Insufficient documentation

## 2020-10-11 DIAGNOSIS — K625 Hemorrhage of anus and rectum: Secondary | ICD-10-CM | POA: Diagnosis not present

## 2020-10-11 DIAGNOSIS — Z888 Allergy status to other drugs, medicaments and biological substances status: Secondary | ICD-10-CM | POA: Insufficient documentation

## 2020-10-11 DIAGNOSIS — Z833 Family history of diabetes mellitus: Secondary | ICD-10-CM | POA: Diagnosis not present

## 2020-10-11 DIAGNOSIS — I4891 Unspecified atrial fibrillation: Secondary | ICD-10-CM | POA: Insufficient documentation

## 2020-10-11 DIAGNOSIS — I130 Hypertensive heart and chronic kidney disease with heart failure and stage 1 through stage 4 chronic kidney disease, or unspecified chronic kidney disease: Secondary | ICD-10-CM | POA: Diagnosis not present

## 2020-10-11 DIAGNOSIS — K621 Rectal polyp: Secondary | ICD-10-CM

## 2020-10-11 DIAGNOSIS — I5032 Chronic diastolic (congestive) heart failure: Secondary | ICD-10-CM | POA: Insufficient documentation

## 2020-10-11 DIAGNOSIS — K921 Melena: Secondary | ICD-10-CM | POA: Diagnosis not present

## 2020-10-11 DIAGNOSIS — Z885 Allergy status to narcotic agent status: Secondary | ICD-10-CM | POA: Insufficient documentation

## 2020-10-11 DIAGNOSIS — D123 Benign neoplasm of transverse colon: Secondary | ICD-10-CM

## 2020-10-11 HISTORY — PX: POLYPECTOMY: SHX5525

## 2020-10-11 HISTORY — PX: BIOPSY: SHX5522

## 2020-10-11 HISTORY — PX: ESOPHAGOGASTRODUODENOSCOPY (EGD) WITH PROPOFOL: SHX5813

## 2020-10-11 HISTORY — PX: COLONOSCOPY WITH PROPOFOL: SHX5780

## 2020-10-11 LAB — GLUCOSE, CAPILLARY
Glucose-Capillary: 142 mg/dL — ABNORMAL HIGH (ref 70–99)
Glucose-Capillary: 126 mg/dL — ABNORMAL HIGH (ref 70–99)

## 2020-10-11 LAB — HM COLONOSCOPY

## 2020-10-11 SURGERY — COLONOSCOPY WITH PROPOFOL
Anesthesia: General

## 2020-10-11 MED ORDER — LIDOCAINE VISCOUS HCL 2 % MT SOLN
OROMUCOSAL | Status: AC
  Filled 2020-10-11: qty 15

## 2020-10-11 MED ORDER — LACTATED RINGERS IV SOLN: INTRAVENOUS | Status: DC

## 2020-10-11 MED ORDER — PROPOFOL 10 MG/ML IV BOLUS
INTRAVENOUS | Status: AC
  Filled 2020-10-11: qty 80

## 2020-10-11 MED ORDER — LIDOCAINE VISCOUS HCL 2 % MT SOLN
15.0000 mL | Freq: Once | OROMUCOSAL | Status: AC
  Administered 2020-10-11: 15 mL via OROMUCOSAL

## 2020-10-11 MED ORDER — PROPOFOL 10 MG/ML IV BOLUS
INTRAVENOUS | Status: DC | PRN
  Administered 2020-10-11: 50 mg via INTRAVENOUS

## 2020-10-11 MED ORDER — PROPOFOL 500 MG/50ML IV EMUL
INTRAVENOUS | Status: DC | PRN
  Administered 2020-10-11: 150 ug/kg/min via INTRAVENOUS

## 2020-10-11 MED ORDER — LIDOCAINE HCL (CARDIAC) PF 100 MG/5ML IV SOSY
PREFILLED_SYRINGE | INTRAVENOUS | Status: DC | PRN
  Administered 2020-10-11: 50 mg via INTRAVENOUS

## 2020-10-11 NOTE — Op Note (Signed)
[facility] Patient Name: Jack Mejia Procedure Date: 10/11/2020 10:28 AM MRN: [id number] Date of Birth: May 27, 1946 Attending MD: [name] ,  CSN: [id number] Age: 75 Admit Type: Outpatient Procedure:                Upper GI endoscopy Indications:              Iron deficiency anemia Providers:                [name], Janeece Riggers, RN, Raphael Gibney,                            Technician Referring MD:              Medicines:                Monitored Anesthesia Care Complications:            No immediate complications. Estimated Blood Loss:     Estimated blood loss: none. Procedure:                Pre-Anesthesia Assessment:                           - Prior to the procedure, a History and Physical                            was performed, and patient medications, allergies                            and sensitivities were reviewed. The patient's                            tolerance of previous anesthesia was reviewed.                           - The risks and benefits of the procedure and the                            sedation options and risks were discussed with the                            patient. All questions were answered and informed                            consent was obtained.                           - ASA Grade Assessment: III - A patient with severe                            systemic disease.                           After obtaining informed consent, the endoscope was                            passed under direct vision. Throughout the  procedure, the patient's blood pressure, pulse, and                            oxygen saturations were monitored continuously. The                            [id number]-H190 ([id number]) scope was introduced through the                            mouth, and advanced to the second part of duodenum.                            The upper GI endoscopy was accomplished without                             difficulty. The patient tolerated the procedure                            well. Scope In: 10:45:53 AM Scope Out: 10:56:21 AM Total Procedure Duration: 0 hours 10 minutes 28 seconds  Findings:      The examined esophagus was normal.      The entire examined stomach was normal.      The examined duodenum was normal. Ampulla was prominent but no       adenomatous changes were seen under NBI. Biopsies for histology were       taken with a cold forceps for evaluation of celiac disease. Impression:               - Normal esophagus.                           - Normal stomach.                           - Normal examined duodenum. Biopsied. Moderate Sedation:      Per Anesthesia Care Recommendation:           - Discharge patient to home (ambulatory).                           - Resume previous diet.                           - Await pathology results. Procedure Code(s):        --- Professional ---                           [phone], Esophagogastroduodenoscopy, flexible,                            transoral; with biopsy, single or multiple Diagnosis Code(s):        --- Professional ---                           D50.9, Iron deficiency anemia, unspecified CPT copyright 2019 American Medical Association. All rights reserved. The codes documented in this report are preliminary and upon coder review may  be revised  to meet current compliance requirements. [name], MD [name],  10/11/2020 10:59:44 AM This report has been signed electronically. Number of Addenda: 0

## 2020-10-11 NOTE — Addendum Note (Signed)
Addendum  created 10/11/20 1210 by Jonna Munro, CRNA   Charge Capture section accepted

## 2020-10-11 NOTE — Discharge Instructions (Signed)
You are being discharged to home.  Resume your previous diet.  We are waiting for your pathology results.  Your physician has recommended a repeat colonoscopy in one year for surveillance.  Return to your GI clinic in four weeks (determine need for further investigation (capsule endoscopy).    LEFT MESSAGE ON VOICE MAIL FOR ANN TILLEY OFFICE COORDINATOR OF PATIENT NEED OF FOLLOW UP         Upper Endoscopy, Adult, Care After This sheet gives you information about how to care for yourself after your procedure. Your health care provider may also give you more specific instructions. If you have problems or questions, contact your health care provider. What can I expect after the procedure? After the procedure, it is common to have:  A sore throat.  Mild stomach pain or discomfort.  Bloating.  Nausea. Follow these instructions at home:  Follow instructions from your health care provider about what to eat or drink after your procedure.  Return to your normal activities as told by your health care provider. Ask your health care provider what activities are safe for you.  Take over-the-counter and prescription medicines only as told by your health care provider.  If you were given a sedative during the procedure, it can affect you for several hours. Do not drive or operate machinery until your health care provider says that it is safe.  Keep all follow-up visits as told by your health care provider. This is important.   Contact a health care provider if you have:  A sore throat that lasts longer than one day.  Trouble swallowing. Get help right away if:  You vomit blood or your vomit looks like coffee grounds.  You have: ? A fever. ? Bloody, black, or tarry stools. ? A severe sore throat or you cannot swallow. ? Difficulty breathing. ? Severe pain in your chest or abdomen. Summary  After the procedure, it is common to have a sore throat, mild stomach discomfort, bloating,  and nausea.  If you were given a sedative during the procedure, it can affect you for several hours. Do not drive or operate machinery until your health care provider says that it is safe.  Follow instructions from your health care provider about what to eat or drink after your procedure.  Return to your normal activities as told by your health care provider. This information is not intended to replace advice given to you by your health care provider. Make sure you discuss any questions you have with your health care provider. Document Revised: 07/11/2019 Document Reviewed: 12/13/2017 Elsevier Patient Education  2021 [place].     Colonoscopy, Adult, Care After This sheet gives you information about how to care for yourself after your procedure. Your doctor may also give you more specific instructions. If you have problems or questions, call your doctor. What can I expect after the procedure? After the procedure, it is common to have:  A small amount of blood in your poop (stool) for 24 hours.  Some gas.  Mild cramping or bloating in your belly (abdomen). Follow these instructions at home: Eating and drinking  Drink enough fluid to keep your pee (urine) pale yellow.  Follow instructions from your doctor about what you cannot eat or drink.  Return to your normal diet as told by your doctor. Avoid heavy or fried foods that are hard to digest.   Activity  Rest as told by your doctor.  Do not sit for a long time  without moving. Get up to take short walks every 1-2 hours. This is important. Ask for help if you feel weak or unsteady.  Return to your normal activities as told by your doctor. Ask your doctor what activities are safe for you. To help cramping and bloating:  Try walking around.  Put heat on your belly as told by your doctor. Use the heat source that your doctor recommends, such as a moist heat pack or a heating pad. ? Put a towel between your skin and the heat  source. ? Leave the heat on for 20-30 minutes. ? Remove the heat if your skin turns bright red. This is very important if you are unable to feel pain, heat, or cold. You may have a greater risk of getting burned.   General instructions  If you were given a medicine to help you relax (sedative) during your procedure, it can affect you for many hours. Do not drive or use machinery until your doctor says that it is safe.  For the first 24 hours after the procedure: ? Do not sign important documents. ? Do not drink alcohol. ? Do your daily activities more slowly than normal. ? Eat foods that are soft and easy to digest.  Take over-the-counter or prescription medicines only as told by your doctor.  Keep all follow-up visits as told by your doctor. This is important. Contact a doctor if:  You have blood in your poop 2-3 days after the procedure. Get help right away if:  You have more than a small amount of blood in your poop.  You see large clumps of tissue (blood clots) in your poop.  Your belly is swollen.  You feel like you may vomit (nauseous).  You vomit.  You have a fever.  You have belly pain that gets worse, and medicine does not help your pain. Summary  After the procedure, it is common to have a small amount of blood in your poop. You may also have mild cramping and bloating in your belly.  If you were given a medicine to help you relax (sedative) during your procedure, it can affect you for many hours. Do not drive or use machinery until your doctor says that it is safe.  Get help right away if you have a lot of blood in your poop, feel like you may vomit, have a fever, or have more belly pain. This information is not intended to replace advice given to you by your health care provider. Make sure you discuss any questions you have with your health care provider. Document Revised: 05/19/2019 Document Reviewed: 02/06/2019 Elsevier Patient Education  2021 [place] After This sheet gives you information about how to care for yourself after your procedure. Your health care provider may also give you more specific instructions. If you have problems or questions, contact your health care provider. What can I expect after the procedure? After the procedure, it is common to have:  Tiredness.  Forgetfulness about what happened after the procedure.  Impaired judgment for important decisions.  Nausea or vomiting.  Some difficulty with balance. Follow these instructions at home: For the time period you were told by your health care provider:  Rest as needed.  Do not participate in activities where you could fall or become injured.  Do not drive or use machinery.  Do not drink alcohol.  Do not take sleeping pills or medicines that cause drowsiness.  Do  not make important decisions or sign legal documents.  Do not take care of children on your own.      Eating and drinking  Follow the diet that is recommended by your health care provider.  Drink enough fluid to keep your urine pale yellow.  If you vomit: ? Drink water, juice, or soup when you can drink without vomiting. ? Make sure you have little or no nausea before eating solid foods. General instructions  Have a responsible adult stay with you for the time you are told. It is important to have someone help care for you until you are awake and alert.  Take over-the-counter and prescription medicines only as told by your health care provider.  If you have sleep apnea, surgery and certain medicines can increase your risk for breathing problems. Follow instructions from your health care provider about wearing your sleep device: ? Anytime you are sleeping, including during daytime naps. ? While taking prescription pain medicines, sleeping medicines, or medicines that make you drowsy.  Avoid smoking.  Keep all follow-up visits as told by your  health care provider. This is important. Contact a health care provider if:  You keep feeling nauseous or you keep vomiting.  You feel light-headed.  You are still sleepy or having trouble with balance after 24 hours.  You develop a rash.  You have a fever.  You have redness or swelling around the IV site. Get help right away if:  You have trouble breathing.  You have new-onset confusion at home. Summary  For several hours after your procedure, you may feel tired. You may also be forgetful and have poor judgment.  Have a responsible adult stay with you for the time you are told. It is important to have someone help care for you until you are awake and alert.  Rest as told. Do not drive or operate machinery. Do not drink alcohol or take sleeping pills.  Get help right away if you have trouble breathing, or if you suddenly become confused. This information is not intended to replace advice given to you by your health care provider. Make sure you discuss any questions you have with your health care provider. Document Revised: 03/28/2020 Document Reviewed: 06/15/2019 Elsevier Patient Education  2021 [employer].

## 2020-10-11 NOTE — Transfer of Care (Signed)
Immediate Anesthesia Transfer of Care Note  Patient: Jack Mejia  Procedure(s) Performed: COLONOSCOPY WITH PROPOFOL (N/A ) ESOPHAGOGASTRODUODENOSCOPY (EGD) WITH PROPOFOL (N/A ) BIOPSY POLYPECTOMY  Patient Location: PACU  Anesthesia Type:General  Level of Consciousness: awake, alert , oriented and patient cooperative  Airway & Oxygen Therapy: Patient Spontanous Breathing  Post-op Assessment: Report given to RN, Post -op Vital signs reviewed and stable and Patient moving all extremities X 4  Post vital signs: Reviewed and stable  Last Vitals:  Vitals Value Taken Time  BP 101/52 10/11/20 1206  Temp 36.6 C 10/11/20 1206  Pulse 58 10/11/20 1209  Resp 15 10/11/20 1209  SpO2 95 % 10/11/20 1209  Vitals shown include unvalidated device data.  Last Pain:  Vitals:   10/11/20 1030  TempSrc:   PainSc: 0-No pain      Patients Stated Pain Goal: 8 ([id number])  Complications: No complications documented.

## 2020-10-11 NOTE — Anesthesia Postprocedure Evaluation (Signed)
Anesthesia Post Note  Patient: Jack Mejia  Procedure(s) Performed: COLONOSCOPY WITH PROPOFOL (N/A ) ESOPHAGOGASTRODUODENOSCOPY (EGD) WITH PROPOFOL (N/A ) BIOPSY POLYPECTOMY  Patient location during evaluation: PACU Anesthesia Type: General Level of consciousness: awake, oriented and patient cooperative Pain management: satisfactory to patient Vital Signs Assessment: post-procedure vital signs reviewed and stable Respiratory status: spontaneous breathing, respiratory function stable and nonlabored ventilation Cardiovascular status: stable Postop Assessment: no apparent nausea or vomiting Anesthetic complications: no   No complications documented.   Last Vitals:  Vitals:   10/11/20 0956 10/11/20 1206  BP: (!) 163/74 (!) 101/52  Pulse:  (!) 58  Resp: 14 14  Temp: 36.9 C 36.6 C  SpO2: 98% 95%    Last Pain:  Vitals:   10/11/20 1030  TempSrc:   PainSc: 0-No pain                 Myrl Lazarus

## 2020-10-11 NOTE — Op Note (Signed)
[facility] Patient Name: Jack Mejia Procedure Date: 10/11/2020 10:59 AM MRN: [id number] Date of Birth: [birth year] Attending MD: [name] ,  CSN: [id number] Age: 75 Admit Type: Outpatient Procedure:                Colonoscopy Indications:              Rectal bleeding Providers:                [name], Janeece Riggers, RN, Raphael Gibney,                            Technician Referring MD:              Medicines:                Monitored Anesthesia Care Complications:            No immediate complications. Estimated Blood Loss:     Estimated blood loss: none. Estimated blood loss                            was minimal. Procedure:                Pre-Anesthesia Assessment:                           - Prior to the procedure, a History and Physical                            was performed, and patient medications, allergies                            and sensitivities were reviewed. The patient's                            tolerance of previous anesthesia was reviewed.                           - The risks and benefits of the procedure and the                            sedation options and risks were discussed with the                            patient. All questions were answered and informed                            consent was obtained.                           - ASA Grade Assessment: III - A patient with severe                            systemic disease.                           After obtaining informed consent, the colonoscope  was passed under direct vision. Throughout the                            procedure, the patient's blood pressure, pulse, and                            oxygen saturations were monitored continuously. The                            [id number]-[id number] ([id number]) scope was introduced through                            the anus and advanced to the the terminal ileum.                            The colonoscopy was  performed without difficulty.                            The patient tolerated the procedure well. The                            quality of the bowel preparation was good. Scope                            withdrawal time was 20 minutes. Scope In: 11:03:26 AM Scope Out: 11:58:54 AM Scope Withdrawal Time: 0 hours 48 minutes 7 seconds  Total Procedure Duration: 0 hours 55 minutes 28 seconds  Findings:      The perianal and digital rectal examinations were normal.      A 10 mm polyp was found in the transverse colon. The polyp was sessile.       The polyp was removed with a cold snare. Resection and retrieval were       complete. There was active oozing at the site of polypectomy. To prevent       bleeding after the polypectomy, two hemostatic clips were successfully       placed. A clot was adhered to the polypectomy site. There was no       bleeding at the end of the procedure.      Two sessile polyps were found in the transverse colon. The polyps were 3       to 5 mm in size. These polyps were removed with a cold snare. Resection       and retrieval were complete.      A 15 mm polyp was found in the rectum, just above the dentate line. The       polyp was pedunculated. The polyp was removed with a hot snare.       Resection and retrieval were complete.      The retroflexed view of the distal rectum and anal verge was normal and       showed no anal or rectal abnormalities. Impression:               - One 10 mm polyp in the transverse colon, removed                            with  a cold snare. Resected and retrieved. Clips                            were placed.                           - Two 3 to 5 mm polyps in the transverse colon,                            removed with a cold snare. Resected and retrieved.                           - One 15 mm polyp in the rectum, removed with a hot                            snare. Resected and retrieved.                           - The distal  rectum and anal verge are normal on                            retroflexion view. Moderate Sedation:      Per Anesthesia Care Recommendation:           - Discharge patient to home (ambulatory).                           - Resume previous diet.                           - Await pathology results.                           - Repeat colonoscopy in 1 year for surveillance.                           - Return to GI clinic in 4 weeks to assess symptoms                            and determine need for further investigation                            (capsule endoscopy). Procedure Code(s):        --- Professional ---                           [phone], Colonoscopy, flexible; with removal of                            tumor(s), polyp(s), or other lesion(s) by snare                            technique Diagnosis Code(s):        --- Professional ---  K63.5, Polyp of colon                           K62.1, Rectal polyp                           K62.5, Hemorrhage of anus and rectum CPT copyright 2019 [employer]. All rights reserved. The codes documented in this report are preliminary and upon coder review may  be revised to meet current compliance requirements. [name], MD [name],  10/11/2020 12:09:41 PM This report has been signed electronically. Number of Addenda: 0

## 2020-10-11 NOTE — Anesthesia Preprocedure Evaluation (Signed)
Anesthesia Evaluation  Patient identified by MRN, date of birth, ID band Patient awake    Reviewed: Allergy & Precautions, NPO status , Patient's Chart, lab work & pertinent test results, reviewed documented beta blocker date and time   Airway Mallampati: III  TM Distance: >3 FB Neck ROM: Full    Dental  (+) Upper Dentures, Lower Dentures   Pulmonary shortness of breath (Amiodarone pulmonary toxicity) and with exertion, asthma , pneumonia, former smoker,  Amiodarone pulmonary toxicity   Pulmonary exam normal breath sounds clear to auscultation       Cardiovascular Exercise Tolerance: Good hypertension, Pt. on medications and Pt. on home beta blockers + CAD and +CHF  + dysrhythmias Atrial Fibrillation + Valvular Problems/Murmurs AS  Rhythm:Regular Rate:Normal + Systolic murmurs    Neuro/Psych PSYCHIATRIC DISORDERS Anxiety Depression    GI/Hepatic negative GI ROS, Neg liver ROS,   Endo/Other  diabetes, Well Controlled, Type 2, Insulin Dependent  Renal/GU Renal InsufficiencyRenal disease     Musculoskeletal  (+) Arthritis , Osteoarthritis,    Abdominal   Peds  Hematology  (+) anemia ,   Anesthesia Other Findings Cardiology note-  History of Present Illness: Jack Mejia is a 75 y.o. male with a history of dyspnea on exertion, atrial fibrillation (status post DCCV 03/2019), CAD, HLD, HTN, DM 2, chronic diastolic heart failure, CKD stage III.   Was last here for follow-up to review recent stress test and echocardiogram results.  Stress test was low risk.  Echocardiogram On 05/08/2020 demonstrated EF 65 to 70%, mild LVH, G1 DD, trivial MR, moderate aortic valve stenosis.  Patient's significant other who was with him stated he went to the kidney clinic [facility] and gave a urine sample.   She stated there was mention of possible renal scan.   Significant other stated [facility] had not called them  back regarding follow-up or next steps.  We talked about the moderate aortic valve stenosis and possible contribution to dyspnea on exertion.  Patient had poorly controlled diabetes with daily blood sugars in the 200 range.  He had been managed by PCP for his diabetes.  Mentioned possible referral to endocrinology and valve clinic for further evaluation of both moderate aortic stenosis and uncontrolled diabetes.  Significant other stated due to recent fall patient continued to have significant back pain and hip pain although he had no fractures.  Blood pressure was elevated but significant other stated it may be due to pain.  She stated at home systolic blood pressure usually ranges in the 120s to 130s.  Patient stated he was tired of having to run to the bathroom.    Reproductive/Obstetrics                            Anesthesia Physical Anesthesia Plan  ASA: III  Anesthesia Plan: General   Post-op Pain Management:    Induction: Intravenous  PONV Risk Score and Plan:   Airway Management Planned: Nasal Cannula and Natural Airway  Additional Equipment:   Intra-op Plan:   Post-operative Plan:   Informed Consent: I have reviewed the patients History and Physical, chart, labs and discussed the procedure including the risks, benefits and alternatives for the proposed anesthesia with the patient or authorized representative who has indicated his/her understanding and acceptance.     Dental advisory given  Plan Discussed with: CRNA and Surgeon  Anesthesia Plan Comments:        Anesthesia Quick Evaluation

## 2020-10-11 NOTE — Interval H&P Note (Signed)
History and Physical Interval Note:  10/11/2020 10:25 AM Jack Mejia is a 75 y.o. male with past medical history of atrial fibrillation on Eliquis, hypertension, diastolic heart failure, CKD, hyperlipidemia, diabetes and history of macrocytic anemia , who presents for evaluation of hematochezia.  Patient reports that he is still having some episodes of blood in his stool occasionally.  He denies having any melena, abdominal pain, nausea, vomiting, fever or chills.  .  Most recent hemoglobin was 11.48 days ago, his ferritin store was 266 with normal iron stores.  Last EGD: never Last Colonoscopy: 2011, had 5 polyps removed with cold forceps and 6 polyps removed in the sigmoid colon (he had tubular adenomas and one sessile serrated lesion), patient was advised to have a repeat colonoscopy 5 years later but he declined to this procedure.  BP (!) 163/74   Temp 98.5 F (36.9 C) (Oral)   Resp 14   Ht 5\' 10"  (1.778 m)   Wt 83.9 kg   SpO2 98%   BMI 26.54 kg/m  GENERAL: The patient is AO x3, in no acute distress. HEENT: Head is normocephalic and atraumatic. EOMI are intact. Mouth is well hydrated and without lesions. NECK: Supple. No masses LUNGS: Clear to auscultation. No presence of rhonchi/wheezing/rales. Adequate chest expansion HEART: RRR, normal s1 and s2. ABDOMEN: Soft, nontender, no guarding, no peritoneal signs, and nondistended. BS +. No masses. EXTREMITIES: Without any cyanosis, clubbing, rash, lesions or edema. NEUROLOGIC: AOx3, no focal motor deficit. SKIN: no jaundice, no rashes   Jack Mejia  has presented today for surgery, with the diagnosis of Hematochezia.  The various methods of treatment have been discussed with the patient and family. After consideration of risks, benefits and other options for treatment, the patient has consented to  Procedure(s) with comments: COLONOSCOPY WITH PROPOFOL (N/A) - AM ESOPHAGOGASTRODUODENOSCOPY (EGD) WITH PROPOFOL (N/A) as a surgical  intervention.  The patient's history has been reviewed, patient examined, no change in status, stable for surgery.  I have reviewed the patient's chart and labs.  Questions were answered to the patient's satisfaction.     [name] May[place]a

## 2020-10-14 ENCOUNTER — Telehealth (INDEPENDENT_AMBULATORY_CARE_PROVIDER_SITE_OTHER): Payer: Self-pay | Admitting: *Deleted

## 2020-10-14 LAB — SURGICAL PATHOLOGY

## 2020-10-14 NOTE — Telephone Encounter (Signed)
Per tcs/egd op notes - patient needs ov 4 weeks

## 2020-10-15 ENCOUNTER — Other Ambulatory Visit: Payer: Self-pay | Admitting: Family Medicine

## 2020-10-15 ENCOUNTER — Encounter (INDEPENDENT_AMBULATORY_CARE_PROVIDER_SITE_OTHER): Payer: Self-pay | Admitting: *Deleted

## 2020-10-15 ENCOUNTER — Telehealth: Payer: Self-pay | Admitting: Family Medicine

## 2020-10-15 DIAGNOSIS — M109 Gout, unspecified: Secondary | ICD-10-CM

## 2020-10-15 MED ORDER — ALLOPURINOL 300 MG PO TABS: 300.0000 mg | ORAL_TABLET | Freq: Every day | ORAL | 1 refills | Status: DC

## 2020-10-15 MED ORDER — COLCHICINE 0.6 MG PO TABS: ORAL_TABLET | ORAL | 0 refills | Status: DC

## 2020-10-15 NOTE — Telephone Encounter (Signed)
Is it possible to send to Dr. Lovena Le to weigh in on..... Epic is giving me warnings due to his other meds. Thanks, KD

## 2020-10-15 NOTE — Telephone Encounter (Signed)
Make sure you speak with the caregiver, bc pt won't know how to dose these meds.  Don't take together bc the allopurinol can make the gout flare worse.  Thx. Dr. Lovena Le

## 2020-10-15 NOTE — Telephone Encounter (Signed)
Pt seen in February for gout. Please advise. Thank you

## 2020-10-15 NOTE — Telephone Encounter (Signed)
Please advise. Thank you

## 2020-10-15 NOTE — Telephone Encounter (Signed)
Patient is having issues with gout again and wanting something called into [facility]

## 2020-10-15 NOTE — Telephone Encounter (Signed)
Pt care taker Lelon Frohlich contacted and verbalized understanding.

## 2020-10-17 ENCOUNTER — Ambulatory Visit (HOSPITAL_COMMUNITY): Payer: Medicare HMO

## 2020-10-17 ENCOUNTER — Inpatient Hospital Stay (HOSPITAL_COMMUNITY): Payer: Medicare HMO

## 2020-10-17 ENCOUNTER — Ambulatory Visit (HOSPITAL_COMMUNITY): Payer: Medicare HMO | Admitting: Hematology

## 2020-10-18 ENCOUNTER — Encounter (HOSPITAL_COMMUNITY): Payer: Self-pay | Admitting: Gastroenterology

## 2020-10-21 DIAGNOSIS — E113293 Type 2 diabetes mellitus with mild nonproliferative diabetic retinopathy without macular edema, bilateral: Secondary | ICD-10-CM | POA: Diagnosis not present

## 2020-10-23 ENCOUNTER — Inpatient Hospital Stay (HOSPITAL_COMMUNITY): Payer: Medicare HMO

## 2020-10-23 ENCOUNTER — Other Ambulatory Visit: Payer: Self-pay

## 2020-10-23 DIAGNOSIS — Z86718 Personal history of other venous thrombosis and embolism: Secondary | ICD-10-CM | POA: Diagnosis not present

## 2020-10-23 DIAGNOSIS — D509 Iron deficiency anemia, unspecified: Secondary | ICD-10-CM | POA: Diagnosis not present

## 2020-10-23 DIAGNOSIS — D539 Nutritional anemia, unspecified: Secondary | ICD-10-CM

## 2020-10-23 LAB — CBC
HCT: 35.1 % — ABNORMAL LOW (ref 39.0–52.0)
Hemoglobin: 11.4 g/dL — ABNORMAL LOW (ref 13.0–17.0)
MCH: 34.8 pg — ABNORMAL HIGH (ref 26.0–34.0)
MCHC: 32.5 g/dL (ref 30.0–36.0)
MCV: 107 fL — ABNORMAL HIGH (ref 80.0–100.0)
Platelets: 168 10*3/uL (ref 150–400)
RBC: 3.28 MIL/uL — ABNORMAL LOW (ref 4.22–5.81)
RDW: 14.7 % (ref 11.5–15.5)
WBC: 5 10*3/uL (ref 4.0–10.5)
nRBC: 0 % (ref 0.0–0.2)

## 2020-10-23 NOTE — Progress Notes (Signed)
Hgb 11.4 today.  No injection needed today.  No complaints voiced.  Patient given a copy of labs with instructions to return as scheduled.  Discharged in satisfactory condition with no s/s of distress noted.

## 2020-10-23 NOTE — Patient Instructions (Signed)
[place] at [facility]  Discharge Instructions:  No injection needed today.  Return as scheduled.  _______________________________________________________________  Thank you for choosing [place] at [facility] to provide your oncology and hematology care.  To afford each patient quality time with our providers, please arrive at least 15 minutes before your scheduled appointment.  You need to re-schedule your appointment if you arrive 10 or more minutes late.  We strive to give you quality time with our providers, and arriving late affects you and other patients whose appointments are after yours.  Also, if you no show three or more times for appointments you may be dismissed from the clinic.  Again, thank you for choosing [facility] [place] hope is that these requests will allow you access to exceptional care and in a timely manner. _______________________________________________________________  If you have questions after your visit, please contact our office at [phone] between the hours of 8:30 a.m. and 5:00 p.m. Voicemails left after 4:30 p.m. will not be returned until the following business day. _______________________________________________________________  For prescription refill requests, have your pharmacy contact our office. _______________________________________________________________  Recommendations made by the consultant and any test results will be sent to your referring physician. _______________________________________________________________

## 2020-10-25 ENCOUNTER — Other Ambulatory Visit: Payer: Self-pay | Admitting: Family Medicine

## 2020-10-30 ENCOUNTER — Other Ambulatory Visit: Payer: Self-pay | Admitting: Student

## 2020-11-06 ENCOUNTER — Inpatient Hospital Stay (HOSPITAL_BASED_OUTPATIENT_CLINIC_OR_DEPARTMENT_OTHER): Payer: Medicare HMO | Admitting: Hematology

## 2020-11-06 ENCOUNTER — Inpatient Hospital Stay (HOSPITAL_COMMUNITY): Payer: Medicare HMO

## 2020-11-06 ENCOUNTER — Inpatient Hospital Stay (HOSPITAL_COMMUNITY): Payer: Medicare HMO | Attending: Hematology

## 2020-11-06 ENCOUNTER — Other Ambulatory Visit: Payer: Self-pay | Admitting: Family Medicine

## 2020-11-06 ENCOUNTER — Other Ambulatory Visit: Payer: Self-pay

## 2020-11-06 VITALS — BP 137/71 | HR 59 | Temp 97.0°F | Resp 18 | Wt 185.8 lb

## 2020-11-06 DIAGNOSIS — R5383 Other fatigue: Secondary | ICD-10-CM | POA: Insufficient documentation

## 2020-11-06 DIAGNOSIS — Z7901 Long term (current) use of anticoagulants: Secondary | ICD-10-CM | POA: Diagnosis not present

## 2020-11-06 DIAGNOSIS — D539 Nutritional anemia, unspecified: Secondary | ICD-10-CM | POA: Diagnosis not present

## 2020-11-06 DIAGNOSIS — R0602 Shortness of breath: Secondary | ICD-10-CM | POA: Insufficient documentation

## 2020-11-06 DIAGNOSIS — D7589 Other specified diseases of blood and blood-forming organs: Secondary | ICD-10-CM | POA: Insufficient documentation

## 2020-11-06 DIAGNOSIS — I129 Hypertensive chronic kidney disease with stage 1 through stage 4 chronic kidney disease, or unspecified chronic kidney disease: Secondary | ICD-10-CM | POA: Insufficient documentation

## 2020-11-06 DIAGNOSIS — N281 Cyst of kidney, acquired: Secondary | ICD-10-CM | POA: Diagnosis not present

## 2020-11-06 DIAGNOSIS — Z87891 Personal history of nicotine dependence: Secondary | ICD-10-CM | POA: Diagnosis not present

## 2020-11-06 DIAGNOSIS — R51 Headache with orthostatic component, not elsewhere classified: Secondary | ICD-10-CM | POA: Insufficient documentation

## 2020-11-06 DIAGNOSIS — J45909 Unspecified asthma, uncomplicated: Secondary | ICD-10-CM | POA: Insufficient documentation

## 2020-11-06 DIAGNOSIS — E119 Type 2 diabetes mellitus without complications: Secondary | ICD-10-CM | POA: Insufficient documentation

## 2020-11-06 DIAGNOSIS — I4891 Unspecified atrial fibrillation: Secondary | ICD-10-CM | POA: Insufficient documentation

## 2020-11-06 DIAGNOSIS — M129 Arthropathy, unspecified: Secondary | ICD-10-CM | POA: Insufficient documentation

## 2020-11-06 DIAGNOSIS — K760 Fatty (change of) liver, not elsewhere classified: Secondary | ICD-10-CM | POA: Insufficient documentation

## 2020-11-06 DIAGNOSIS — D469 Myelodysplastic syndrome, unspecified: Secondary | ICD-10-CM | POA: Insufficient documentation

## 2020-11-06 DIAGNOSIS — Z87442 Personal history of urinary calculi: Secondary | ICD-10-CM | POA: Insufficient documentation

## 2020-11-06 DIAGNOSIS — I2510 Atherosclerotic heart disease of native coronary artery without angina pectoris: Secondary | ICD-10-CM | POA: Insufficient documentation

## 2020-11-06 DIAGNOSIS — Z79899 Other long term (current) drug therapy: Secondary | ICD-10-CM | POA: Diagnosis not present

## 2020-11-06 DIAGNOSIS — N189 Chronic kidney disease, unspecified: Secondary | ICD-10-CM | POA: Diagnosis not present

## 2020-11-06 DIAGNOSIS — D509 Iron deficiency anemia, unspecified: Secondary | ICD-10-CM | POA: Diagnosis not present

## 2020-11-06 DIAGNOSIS — K635 Polyp of colon: Secondary | ICD-10-CM | POA: Diagnosis not present

## 2020-11-06 DIAGNOSIS — E785 Hyperlipidemia, unspecified: Secondary | ICD-10-CM | POA: Insufficient documentation

## 2020-11-06 LAB — COMPREHENSIVE METABOLIC PANEL
ALT: 21 U/L (ref 0–44)
AST: 23 U/L (ref 15–41)
Albumin: 4.2 g/dL (ref 3.5–5.0)
Alkaline Phosphatase: 99 U/L (ref 38–126)
Anion gap: 13 (ref 5–15)
BUN: 33 mg/dL — ABNORMAL HIGH (ref 8–23)
CO2: 26 mmol/L (ref 22–32)
Calcium: 9.3 mg/dL (ref 8.9–10.3)
Chloride: 100 mmol/L (ref 98–111)
Creatinine, Ser: 1.98 mg/dL — ABNORMAL HIGH (ref 0.61–1.24)
GFR, Estimated: 35 mL/min — ABNORMAL LOW (ref >60)
Glucose, Bld: 152 mg/dL — ABNORMAL HIGH (ref 70–99)
Potassium: 3.8 mmol/L (ref 3.5–5.1)
Sodium: 139 mmol/L (ref 135–145)
Total Bilirubin: 1.1 mg/dL (ref 0.3–1.2)
Total Protein: 7.6 g/dL (ref 6.5–8.1)

## 2020-11-06 LAB — CBC
HCT: 36.4 % — ABNORMAL LOW (ref 39.0–52.0)
Hemoglobin: 11.9 g/dL — ABNORMAL LOW (ref 13.0–17.0)
MCH: 35.6 pg — ABNORMAL HIGH (ref 26.0–34.0)
MCHC: 32.7 g/dL (ref 30.0–36.0)
MCV: 109 fL — ABNORMAL HIGH (ref 80.0–100.0)
Platelets: 129 10*3/uL — ABNORMAL LOW (ref 150–400)
RBC: 3.34 MIL/uL — ABNORMAL LOW (ref 4.22–5.81)
RDW: 15.6 % — ABNORMAL HIGH (ref 11.5–15.5)
WBC: 4.9 10*3/uL (ref 4.0–10.5)
nRBC: 0 % (ref 0.0–0.2)

## 2020-11-06 LAB — IRON AND TIBC
Iron: 94 ug/dL (ref 45–182)
Saturation Ratios: 29 % (ref 17.9–39.5)
TIBC: 321 ug/dL (ref 250–450)
UIBC: 227 ug/dL

## 2020-11-06 LAB — FERRITIN: Ferritin: 305 ng/mL (ref 24–336)

## 2020-11-06 NOTE — Progress Notes (Signed)
Jack Mejia, [place] [id number]   CLINIC:  Medical Oncology/Hematology  PCP:  Erven Colla, DO [address] / [place] [address]  [phone]  REASON FOR VISIT:  Follow-up for macrocytic anemia  PRIOR THERAPY: Intermittent Venofer  CURRENT THERAPY: Retacrit 30,000 units every 4 weeks  INTERVAL HISTORY:  [name], a 75 y.o. male, returns for routine follow-up for his macrocytic anemia. Jack Mejia was last seen on 08/22/2020.  Today he is accompanied by his wife and he reports feeling fair. His breathing has improved. He has not restarted Eliquis until he sees his cardiologist on 06/02. He denies having any cough, abdominal pain or leg swelling.   REVIEW OF SYSTEMS:  Review of Systems  Constitutional: Positive for fatigue (25%). Negative for appetite change.  HENT:   Positive for nosebleeds.   Respiratory: Positive for shortness of breath (improved). Negative for cough.   Cardiovascular: Negative for leg swelling.  Gastrointestinal: Negative for abdominal pain.  Neurological: Positive for headaches (occasional).  All other systems reviewed and are negative.   PAST MEDICAL/SURGICAL HISTORY:  Past Medical History:  Diagnosis Date  . Anxiety   . Arthritis   . Asthma   . Atrial fibrillation ([place])    a. s/p DCCV in 03/2019  . CAD (coronary artery disease) 06/29/2019  . Cramps of left lower extremity   . Depression   . Diabetes mellitus    type 2 for 7-8 yrs  . Dyspnea   . Elevated liver enzymes   . Fatty liver   . Gout   . History of kidney stones   . Hyperlipidemia   . Hypertension   . Renal insufficiency   . Vertigo    Past Surgical History:  Procedure Laterality Date  . BIOPSY  10/11/2020   Procedure: BIOPSY;  Surgeon: Montez Morita, Quillian Quince, MD;  [facility];  Service: Gastroenterology;;  . CARDIOVERSION N/A 04/21/2019   Procedure: CARDIOVERSION;  Surgeon: Sanda Klein, MD;  [facility]: [facility];  Service: Cardiovascular;  Laterality: N/A;  . CARPAL TUNNEL RELEASE     both wrist  . cataract surgery     bilateral  . CHOLECYSTECTOMY    . COLONOSCOPY  2011  . COLONOSCOPY WITH PROPOFOL N/A 10/11/2020   Procedure: COLONOSCOPY WITH PROPOFOL;  Surgeon: Harvel Quale, MD;  [facility];  Service: Gastroenterology;  Laterality: N/A;  AM  . ESOPHAGOGASTRODUODENOSCOPY (EGD) WITH PROPOFOL N/A 10/11/2020   Procedure: ESOPHAGOGASTRODUODENOSCOPY (EGD) WITH PROPOFOL;  Surgeon: Harvel Quale, MD;  [facility];  Service: Gastroenterology;  Laterality: N/A;  . EYE SURGERY    . HEMORROIDECTOMY    . KNEE SURGERY Left   . LEFT HEART CATH AND CORONARY ANGIOGRAPHY N/A 05/23/2019   Procedure: LEFT HEART CATH AND CORONARY ANGIOGRAPHY;  Surgeon: Belva Crome, MD;  Location: [facility];  Service: Cardiovascular;  Laterality: N/A;  . LUMBAR LAMINECTOMY/DECOMPRESSION MICRODISCECTOMY N/A 08/06/2016   Procedure: LUMBAR THREE- LUMBAR FIVE  DECOMPRESSIVE LUMBAR LAMINECTOMY;  Surgeon: Jovita Gamma, MD;  Location: [facility];  Service: Neurosurgery;  Laterality: N/A;  . POLYPECTOMY  10/11/2020   Procedure: POLYPECTOMY;  Surgeon: Harvel Quale, MD;  [facility];  Service: Gastroenterology;;    SOCIAL HISTORY:  Social History   Socioeconomic History  . Marital status: Divorced    Spouse name: Not on file  . Number of children: 2  . Years of education: Not on file  .  Highest education level: Not on file  Occupational History  . Occupation: RETIRED  Tobacco Use  . Smoking status: Former Smoker    Packs/day: 4.00    Years: 20.00    Pack years: 80.00    Types: Cigarettes    Quit date: 10/14/1982    Years since quitting: 38.0  . Smokeless tobacco: Never Used  . Tobacco comment: 29 yrs ago  Vaping Use  . Vaping Use: Never used  Substance and Sexual Activity  . Alcohol use: No  . Drug use: No  . Sexual activity: Not  Currently    Birth control/protection: None  Other Topics Concern  . Not on file  Social History Narrative  . Not on file   Social Determinants of Health   Financial Resource Strain: Not on file  Food Insecurity: Not on file  Transportation Needs: Not on file  Physical Activity: Not on file  Stress: Not on file  Social Connections: Not on file  Intimate Partner Violence: Not on file    FAMILY HISTORY:  Family History  Problem Relation Age of Onset  . Diabetes Mother   . Cancer Mother        unknown kind  . Diabetes Sister   . COPD Sister   . Liver disease Brother   . COPD Brother   . Diabetes Maternal Grandmother   . Diabetes Brother   . COPD Sister   . Healthy Daughter   . Healthy Daughter   . Colon cancer Neg Hx     CURRENT MEDICATIONS:  Current Outpatient Medications  Medication Sig Dispense Refill  . acetaminophen (TYLENOL) 650 MG CR tablet Take 650 mg by mouth every 8 (eight) hours as needed for pain.    [name] allopurinol (ZYLOPRIM) 300 MG tablet Take 1 tablet (300 mg total) by mouth daily. Start this after gout flare. 90 tablet 1  . atorvastatin (LIPITOR) 20 MG tablet Take 1 tablet (20 mg total) by mouth daily. 30 tablet 2  . Balsam [place]-Castor Oil (VENELEX) OINT Apply 1 application topically 3 (three) times daily as needed (irritation). Apply to sacrum and bilateral buttocks    . BD PEN NEEDLE NANO 2ND GEN 32G X 4 MM MISC Use with insulin pen qid. 120 each 5  . clotrimazole-betamethasone (LOTRISONE) cream Apply 1 application topically 2 (two) times daily. (Patient taking differently: Apply 1 application topically 2 (two) times daily as needed (irritation).) 30 g 3  . diltiazem (CARDIZEM CD) 360 MG 24 hr capsule TAKE 1 CAPSULE BY MOUTH EVERY DAY 90 capsule 3  . glipiZIDE (GLUCOTROL) 5 MG tablet TAKE ONE TABLET BY MOUTH 4 TIMES A DAY    . JARDIANCE 10 MG TABS tablet TAKE 1 TABLET (10 MG TOTAL) BY MOUTH DAILY BEFORE BREAKFAST. (Patient taking differently: Take 10 mg by  mouth daily.) 30 tablet 5  . LEVEMIR FLEXTOUCH 100 UNIT/ML FlexPen Inject 22 Units into the skin at bedtime. 15 mL 1  . metoprolol tartrate (LOPRESSOR) 25 MG tablet TAKE 1 TABLET BY MOUTH TWICE A DAY (Patient taking differently: Take 25 mg by mouth 2 (two) times daily.) 180 tablet 1  . NOVOLOG FLEXPEN 100 UNIT/ML FlexPen INJECT 15 UNITS INTO THE SKIN 3 (THREE) TIMES DAILY WITH MEALS 15 mL 0  . ONETOUCH ULTRA test strip 1 each 2 (two) times daily.    [name] PLENVU 140 g SOLR See admin instructions.    . potassium chloride (KLOR-CON) 10 MEQ tablet Take 1 tablet (10 mEq total) by mouth daily. [place]  tablet 1  . sertraline (ZOLOFT) 100 MG tablet Take 1 tab p.o. qd (Patient taking differently: Take 100 mg by mouth daily. Take 1 tab p.o. qd) 90 tablet 1  . sertraline (ZOLOFT) 50 MG tablet Take 1 tablet by mouth daily.    [name] torsemide (DEMADEX) 20 MG tablet Take 1 tablet (20 mg total) by mouth 2 (two) times daily. 60 tablet 6  . Vitamin D, Ergocalciferol, (DRISDOL) 1.25 MG (50000 UNIT) CAPS capsule Take 1 capsule (50,000 Units total) by mouth every 7 (seven) days. 4 capsule 0   No current facility-administered medications for this visit.    ALLERGIES:  Allergies  Allergen Reactions  . Demerol Nausea And Vomiting  . Vasotec [Enalapril] Cough  . Lasix [Furosemide] Rash    PHYSICAL EXAM:  Performance status (ECOG): 1 - Symptomatic but completely ambulatory  Vitals:   11/06/20 1332  BP: 137/71  Pulse: (!) 59  Resp: 18  Temp: (!) 97 F (36.1 C)  SpO2: 99%   Wt Readings from Last 3 Encounters:  11/06/20 185 lb 13.6 oz (84.3 kg)  10/11/20 185 lb (83.9 kg)  09/27/20 187 lb (84.8 kg)   Physical Exam Vitals reviewed.  Constitutional:      Appearance: Normal appearance.     Comments: In wheelchair  Cardiovascular:     Rate and Rhythm: Normal rate and regular rhythm.     Pulses: Normal pulses.     Heart sounds: Normal heart sounds.  Pulmonary:     Effort: Pulmonary effort is normal.     Breath  sounds: Normal breath sounds.  Abdominal:     Palpations: Abdomen is soft. There is no mass.     Tenderness: There is no abdominal tenderness.  Musculoskeletal:     Right lower leg: No edema.     Left lower leg: No edema.  Neurological:     General: No focal deficit present.     Mental Status: He is alert and oriented to person, place, and time.  Psychiatric:        Mood and Affect: Mood normal.        Behavior: Behavior normal.     LABORATORY DATA:  I have reviewed the labs as listed.  CBC Latest Ref Rng & Units 11/06/2020 10/23/2020 10/03/2020  WBC 4.0 - 10.5 K/uL 4.9 5.0 4.8  Hemoglobin 13.0 - 17.0 g/dL 11.9(L) 11.4(L) 11.4(L)  Hematocrit 39.0 - 52.0 % 36.4(L) 35.1(L) 35.9(L)  Platelets 150 - 400 K/uL 129(L) 168 137(L)   CMP Latest Ref Rng & Units 11/06/2020 10/03/2020 08/22/2020  Glucose 70 - 99 mg/dL 152(H) 162(H) 330(H)  BUN 8 - 23 mg/dL 33(H) 24(H) 26(H)  Creatinine 0.61 - 1.24 mg/dL 1.98(H) 1.73(H) 1.71(H)  Sodium 135 - 145 mmol/L 139 137 133(L)  Potassium 3.5 - 5.1 mmol/L 3.8 3.3(L) 4.0  Chloride 98 - 111 mmol/L 100 98 98  CO2 22 - 32 mmol/L $RemoveB'26 26 26  'GGQDHvqM$ Calcium 8.9 - 10.3 mg/dL 9.3 8.8(L) 8.7(L)  Total Protein 6.5 - 8.1 g/dL 7.6 7.5 6.9  Total Bilirubin 0.3 - 1.2 mg/dL 1.1 0.9 1.0  Alkaline Phos 38 - 126 U/L 99 97 104  AST 15 - 41 U/L $Remo'23 23 22  'ZqrEc$ ALT 0 - 44 U/L $Remo'21 21 18      'LbyWI$ Component Value Date/Time   RBC 3.34 (L) 11/06/2020 1314   MCV 109.0 (H) 11/06/2020 1314   MCV 101 (H) 09/07/2018 1638   MCH 35.6 (H) 11/06/2020 1314   MCHC 32.7 11/06/2020 1314  RDW 15.6 (H) 11/06/2020 1314   RDW 12.8 09/07/2018 1638   LYMPHSABS 1.4 10/03/2020 1307   LYMPHSABS 1.3 09/07/2018 1638   MONOABS 1.5 (H) 10/03/2020 1307   EOSABS 0.1 10/03/2020 1307   EOSABS 0.2 09/07/2018 1638   BASOSABS 0.0 10/03/2020 1307   BASOSABS 0.1 09/07/2018 1638    DIAGNOSTIC IMAGING:  I have independently reviewed the scans and discussed with the patient. No results found.   ASSESSMENT:  1.  Macrocytic anemia: -Patient seen at the request of Dr. Wolfgang Phoenix for macrocytic anemia. -CTAP on 04/15/2017 showed no splenomegaly. -Patient has macrocytosis noted since February 2020, gradually worsening. -Colonoscopy on 06/05/2010 shows small polyp in the sigmoid colon colon, right colon, hepatic flexure and transverse colon. -He continues to have occasional bleeding on the tissue paper since Eliquis was started. -Serum erythropoietin was 77.2. -SPEP was negative. Other nutritional deficiencies were ruled out. -Bone marrow biopsy on 03/29/2020 shows hypercellular marrow with trilineage dysplasia. Blasts are 2%.Chromosome analysis and FISH panel were normal. -5 doses ofVenofercompleted on 01/24/2020. -Retacrit 30,000 units weekly started on 04/08/2020.  2. CKD: -History of CKD since 2017. -Renal ultrasound on 04/21/2019 shows bilateral renal cysts.  3.  Health maintenance: -Colonoscopy on 10/11/2020 with tubular adenoma in the transverse colon hyperplastic polyp in the rectum. -EGD on 10/11/2020 with normal esophagus, stomach and duodenum.   PLAN:  1.Low risk MDS: -He is currently receiving Retacrit 30,000 units every 2 weeks. -He has not received Retacrit since February of this year.  I have reviewed his recent EGD and colonoscopy reports from 10/11/2020. -Reviewed his labs today which showed hemoglobin 11.9, MCV 109.  Platelet count is 129. -Ferritin was 305 with a percent saturation of 29. -As he is not requiring Retacrit frequently, we will change Retacrit to 30,000 units every 4 weeks. -RTC 3 months for follow-up with repeat labs.  2. CKD: -Creatinine today is 1.98 and in his baseline range.  3. Atrial fibrillation: -Eliquis has been on hold since recent hospitalization. -He has follow-up with cardiology in June.  4. Dyspnea on exertion: -Hospitalized from 06/24/2020 through 07/06/2019 with respiratory failure, thought to be secondary to amiodarone induced  pneumonitis.  Amiodarone was discontinued and the steroids were given. -he reports improvement in dyspnea on exertion.   Orders placed this encounter:  No orders of the defined types were placed in this encounter.    Derek Jack, MD [place] [phone]   I, Milinda Antis, am acting as a scribe for Dr. Sanda Linger.  I, Derek Jack MD, have reviewed the above documentation for accuracy and completeness, and I agree with the above.

## 2020-11-06 NOTE — Patient Instructions (Signed)
[place] at [facility] Discharge Instructions  You were seen today by Dr. Delton Coombes. He went over your recent results. Continue getting your labs checked every month to see if you need Retacrit injections. Dr. Delton Coombes will see you back in 3 months for labs and follow up.   Thank you for choosing [place] at [facility] to provide your oncology and hematology care.  To afford each patient quality time with our provider, please arrive at least 15 minutes before your scheduled appointment time.   If you have a lab appointment with the [facility] please come in thru the Main Entrance and check in at the main information desk  You need to re-schedule your appointment should you arrive 10 or more minutes late.  We strive to give you quality time with our providers, and arriving late affects you and other patients whose appointments are after yours.  Also, if you no show three or more times for appointments you may be dismissed from the clinic at the providers discretion.     Again, thank you for choosing [facility].  Our hope is that these requests will decrease the amount of time that you wait before being seen by our physicians.       _____________________________________________________________  Should you have questions after your visit to [facility], please contact our office at [phone] between the hours of 8:00 a.m. and 4:30 p.m.  Voicemails left after 4:00 p.m. will not be returned until the following business day.  For prescription refill requests, have your pharmacy contact our office and allow 72 hours.    Cancer Center Support Programs:   > Cancer Support Group  2nd Tuesday of the month 1pm-2pm, Journey Room

## 2020-11-06 NOTE — Progress Notes (Signed)
Patient seen and assessed by Dr. Delton Coombes.  No injection today per Dr. Delton Coombes due to lab values wnl.

## 2020-11-06 NOTE — Progress Notes (Signed)
No Retacrit injection needed today per Dr.K hgb 11.9 no complaints at this time. Pt discharged in satisfactory condition via wheelchair accompanied by a family member.

## 2020-11-21 ENCOUNTER — Ambulatory Visit (INDEPENDENT_AMBULATORY_CARE_PROVIDER_SITE_OTHER): Payer: Medicare HMO | Admitting: Family Medicine

## 2020-11-21 ENCOUNTER — Other Ambulatory Visit: Payer: Self-pay

## 2020-11-21 VITALS — BP 132/66 | HR 63 | Temp 97.3°F | Wt 188.0 lb

## 2020-11-21 DIAGNOSIS — F320 Major depressive disorder, single episode, mild: Secondary | ICD-10-CM

## 2020-11-21 DIAGNOSIS — N1832 Chronic kidney disease, stage 3b: Secondary | ICD-10-CM

## 2020-11-21 DIAGNOSIS — I129 Hypertensive chronic kidney disease with stage 1 through stage 4 chronic kidney disease, or unspecified chronic kidney disease: Secondary | ICD-10-CM

## 2020-11-21 DIAGNOSIS — G8929 Other chronic pain: Secondary | ICD-10-CM

## 2020-11-21 DIAGNOSIS — E1122 Type 2 diabetes mellitus with diabetic chronic kidney disease: Secondary | ICD-10-CM

## 2020-11-21 DIAGNOSIS — R69 Illness, unspecified: Secondary | ICD-10-CM | POA: Diagnosis not present

## 2020-11-21 DIAGNOSIS — M25562 Pain in left knee: Secondary | ICD-10-CM | POA: Diagnosis not present

## 2020-11-21 MED ORDER — TRAMADOL HCL 50 MG PO TABS: 50.0000 mg | ORAL_TABLET | Freq: Three times a day (TID) | ORAL | 0 refills | Status: AC | PRN

## 2020-11-21 NOTE — Progress Notes (Signed)
Patient ID: Jack Mejia, male    DOB: Jan 27, 1946, 75 y.o.   MRN: [id number]   Chief Complaint  Patient presents with  . Hypertension    Follow up  . Diabetes    Follow up   Subjective:    HPI Pt seen for f/u on HTN and DM2.   Dm2- Compliant with medications. Checking blood glucose.   Not seeing any high or low numbers.  Denies polyuria or polydipsia.  Eye exam: overdue. Foot exam: no concerns.  Diabetes- 174 this am.  Usually around 90-130.  On average around 130. occ having inc thirst and inc in urination. Taking some pills for diuretics.  Not on glipizide anymore.  Went back to trying jardiance Taking levemir at night. taking tid novolog. 14 units. With meals.  occ missing lunch time if out with eating out.   Depression- Pt feeling better, some days feeling down. Has a day every now and then but more days are better.  Seen today with girlfriend. Not feeling wanting to hurt himself anymore.  Taking tylenol 650mg  occ taking that for pain.  Pt stating trying not take meds for the pain.    Lab Results  Component Value Date   HGBA1C 6.5 (H) 11/21/2020     Medical History [place] has a past medical history of Anxiety, Arthritis, Asthma, Atrial fibrillation ([place]), CAD (coronary artery disease) (06/29/2019), Cramps of left lower extremity, Depression, Diabetes mellitus, Dyspnea, Elevated liver enzymes, Fatty liver, Gout, History of kidney stones, Hyperlipidemia, Hypertension, Renal insufficiency, and Vertigo.   Outpatient Encounter Medications as of 11/21/2020  Medication Sig  . [EXPIRED] traMADol (ULTRAM) 50 MG tablet Take 1 tablet (50 mg total) by mouth every 8 (eight) hours as needed for up to 5 days.  [name] acetaminophen (TYLENOL) 650 MG CR tablet Take 650 mg by mouth every 8 (eight) hours as needed for pain.  [name] allopurinol (ZYLOPRIM) 300 MG tablet Take 1 tablet (300 mg total) by mouth daily. Start this after gout flare.  [name] atorvastatin (LIPITOR) 20 MG tablet  Take 1 tablet (20 mg total) by mouth daily.  Roseanne Kaufman [place]-Castor Oil (VENELEX) OINT Apply 1 application topically 3 (three) times daily as needed (irritation). Apply to sacrum and bilateral buttocks  . BD PEN NEEDLE NANO 2ND GEN 32G X 4 MM MISC Use with insulin pen qid.  . clotrimazole-betamethasone (LOTRISONE) cream Apply 1 application topically 2 (two) times daily. (Patient taking differently: Apply 1 application topically 2 (two) times daily as needed (irritation).)  . diltiazem (CARDIZEM CD) 360 MG 24 hr capsule TAKE 1 CAPSULE BY MOUTH EVERY DAY  . JARDIANCE 10 MG TABS tablet TAKE 1 TABLET (10 MG TOTAL) BY MOUTH DAILY BEFORE BREAKFAST. (Patient taking differently: Take 10 mg by mouth daily.)  . LEVEMIR FLEXTOUCH 100 UNIT/ML FlexPen Inject 22 Units into the skin at bedtime.  . metoprolol tartrate (LOPRESSOR) 25 MG tablet TAKE 1 TABLET BY MOUTH TWICE A DAY (Patient taking differently: Take 25 mg by mouth 2 (two) times daily.)  . ONETOUCH ULTRA test strip 1 each 2 (two) times daily.  [name] PLENVU 140 g SOLR See admin instructions.  . sertraline (ZOLOFT) 100 MG tablet Take 1 tab p.o. qd (Patient taking differently: Take 100 mg by mouth daily. Take 1 tab p.o. qd)  . torsemide (DEMADEX) 20 MG tablet Take 1 tablet (20 mg total) by mouth 2 (two) times daily.  . Vitamin D, Ergocalciferol, (DRISDOL) 1.25 MG (50000 UNIT) CAPS capsule Take 1 capsule (50,000 Units  total) by mouth every 7 (seven) days.  . [DISCONTINUED] glipiZIDE (GLUCOTROL) 5 MG tablet TAKE ONE TABLET BY MOUTH 4 TIMES A DAY  . [DISCONTINUED] NOVOLOG FLEXPEN 100 UNIT/ML FlexPen INJECT 15 UNITS INTO THE SKIN 3 (THREE) TIMES DAILY WITH MEALS  . [DISCONTINUED] potassium chloride (KLOR-CON) 10 MEQ tablet Take 1 tablet (10 mEq total) by mouth daily.  . [DISCONTINUED] sertraline (ZOLOFT) 50 MG tablet Take 1 tablet by mouth daily.   No facility-administered encounter medications on file as of 11/21/2020.     Review of Systems  Constitutional:  Negative for chills and fever.  HENT: Negative for congestion, rhinorrhea and sore throat.   Respiratory: Negative for cough, shortness of breath and wheezing.   Cardiovascular: Negative for chest pain and leg swelling.  Gastrointestinal: Negative for abdominal pain, diarrhea, nausea and vomiting.  Genitourinary: Negative for dysuria and frequency.  Skin: Negative for rash.  Neurological: Negative for dizziness, weakness and headaches.  Psychiatric/Behavioral: Negative for dysphoric mood, self-injury, sleep disturbance and suicidal ideas. The patient is not nervous/anxious.      Vitals BP 132/66   Pulse 63   Temp (!) 97.3 F (36.3 C) (Oral)   Wt 188 lb (85.3 kg)   SpO2 96%   BMI 26.98 kg/m   Objective:   Physical Exam Vitals and nursing note reviewed.  Constitutional:      General: He is not in acute distress.    Appearance: Normal appearance. He is not ill-appearing.  HENT:     Head: Normocephalic.     Nose: Nose normal. No congestion.     Mouth/Throat:     Mouth: Mucous membranes are moist.     Pharynx: No oropharyngeal exudate.  Eyes:     Extraocular Movements: Extraocular movements intact.     Conjunctiva/sclera: Conjunctivae normal.     Pupils: Pupils are equal, round, and reactive to light.  Cardiovascular:     Rate and Rhythm: Normal rate and regular rhythm.     Pulses: Normal pulses.     Heart sounds: Normal heart sounds. No murmur heard.   Pulmonary:     Effort: Pulmonary effort is normal.     Breath sounds: Normal breath sounds. No wheezing, rhonchi or rales.  Musculoskeletal:        General: Normal range of motion.     Right lower leg: No edema.     Left lower leg: No edema.  Skin:    General: Skin is warm and dry.     Findings: No rash.  Neurological:     General: No focal deficit present.     Mental Status: He is alert and oriented to person, place, and time.     Cranial Nerves: No cranial nerve deficit.  Psychiatric:        Mood and Affect:  Mood normal.        Behavior: Behavior normal.        Thought Content: Thought content normal.        Judgment: Judgment normal.      Assessment and Plan   1. Type 2 diabetes mellitus with stage 3b chronic kidney disease and hypertension (HCC) - Hemoglobin A1c - Microalbumin, urine  2. Hypertension associated with chronic kidney disease due to type 2 diabetes mellitus ([place])  3. Chronic pain of left knee  4. Depression, major, single episode, mild ([place])   Dm2- improving. a1c - 8.3 Pt had stopped glipizide, pt taking novolog and levemir and jardiance. Pt to get labs.  May need  to increase levemir and try to dec the meal time insulin.  Pt to check bid and call with 7 days of glucose numbers.  Chronic left knee pain- h/o arthritis.  Take tramadol prn.  htn- stable. Cont meds.  Depression- stable. Improving.  Cont zoloft.   Return for f/u dm2, htn, knee pain.  Addendum- a1c on 11/21/20- improved to 6.5.  microalbumin normal.

## 2020-11-21 NOTE — Patient Instructions (Signed)
Check glucose 2x per day fasting and 2 hrs after eating at night for 7 days. Record and call us with the numbers.

## 2020-11-22 LAB — HEMOGLOBIN A1C
Est. average glucose Bld gHb Est-mCnc: 140 mg/dL
Hgb A1c MFr Bld: 6.5 % — ABNORMAL HIGH (ref 4.8–5.6)

## 2020-11-22 LAB — MICROALBUMIN, URINE: Microalbumin, Urine: 255.7 ug/mL

## 2020-11-24 ENCOUNTER — Other Ambulatory Visit: Payer: Self-pay | Admitting: Family Medicine

## 2020-11-29 ENCOUNTER — Other Ambulatory Visit: Payer: Self-pay | Admitting: Family Medicine

## 2020-12-03 ENCOUNTER — Encounter: Payer: Self-pay | Admitting: Family Medicine

## 2020-12-06 ENCOUNTER — Other Ambulatory Visit: Payer: Self-pay

## 2020-12-06 ENCOUNTER — Inpatient Hospital Stay (HOSPITAL_COMMUNITY): Payer: Medicare HMO

## 2020-12-06 ENCOUNTER — Inpatient Hospital Stay (HOSPITAL_COMMUNITY): Payer: Medicare HMO | Attending: Hematology

## 2020-12-06 DIAGNOSIS — D509 Iron deficiency anemia, unspecified: Secondary | ICD-10-CM | POA: Diagnosis not present

## 2020-12-06 DIAGNOSIS — D539 Nutritional anemia, unspecified: Secondary | ICD-10-CM

## 2020-12-06 LAB — CBC
HCT: 35.3 % — ABNORMAL LOW (ref 39.0–52.0)
Hemoglobin: 11.5 g/dL — ABNORMAL LOW (ref 13.0–17.0)
MCH: 36.5 pg — ABNORMAL HIGH (ref 26.0–34.0)
MCHC: 32.6 g/dL (ref 30.0–36.0)
MCV: 112.1 fL — ABNORMAL HIGH (ref 80.0–100.0)
Platelets: 219 10*3/uL (ref 150–400)
RBC: 3.15 MIL/uL — ABNORMAL LOW (ref 4.22–5.81)
RDW: 16.1 % — ABNORMAL HIGH (ref 11.5–15.5)
WBC: 5.1 10*3/uL (ref 4.0–10.5)
nRBC: 0 % (ref 0.0–0.2)

## 2020-12-06 NOTE — Progress Notes (Signed)
Patient presents today for Retacrit injection.  HGB noted to be 11.5.  Patient will not receive injection per order parameters.  No questions or concerns at this time.  Discharge from clinic via wheelchair in stable condition.  Alert and oriented X 3.  Follow up with [facility] as scheduled.

## 2020-12-06 NOTE — Patient Instructions (Signed)
[facility]  Discharge Instructions: Thank you for choosing [facility] to provide your oncology and hematology care.  If you have a lab appointment with the Cancer Center, please come in thru the Main Entrance and check in at the main information desk.  Wear comfortable clothing and clothing appropriate for easy access to any Portacath or PICC line.   We strive to give you quality time with your provider. You may need to reschedule your appointment if you arrive late (15 or more minutes).  Arriving late affects you and other patients whose appointments are after yours.  Also, if you miss three or more appointments without notifying the office, you may be dismissed from the clinic at the provider's discretion.      For prescription refill requests, have your pharmacy contact our office and allow 72 hours for refills to be completed.        To help prevent nausea and vomiting after your treatment, we encourage you to take your nausea medication as directed.  BELOW ARE SYMPTOMS THAT SHOULD BE REPORTED IMMEDIATELY: *FEVER GREATER THAN 100.4 F (38 C) OR HIGHER *CHILLS OR SWEATING *NAUSEA AND VOMITING THAT IS NOT CONTROLLED WITH YOUR NAUSEA MEDICATION *UNUSUAL SHORTNESS OF BREATH *UNUSUAL BRUISING OR BLEEDING *URINARY PROBLEMS (pain or burning when urinating, or frequent urination) *BOWEL PROBLEMS (unusual diarrhea, constipation, pain near the anus) TENDERNESS IN MOUTH AND THROAT WITH OR WITHOUT PRESENCE OF ULCERS (sore throat, sores in mouth, or a toothache) UNUSUAL RASH, SWELLING OR PAIN  UNUSUAL VAGINAL DISCHARGE OR ITCHING   Items with * indicate a potential emergency and should be followed up as soon as possible or go to the Emergency Department if any problems should occur.  Please show the CHEMOTHERAPY ALERT CARD or IMMUNOTHERAPY ALERT CARD at check-in to the Emergency Department and triage nurse.  Should you have questions after your visit or need to cancel  or reschedule your appointment, please contact [facility] [phone]  and follow the prompts.  Office hours are 8:00 a.m. to 4:30 p.m. Monday - Friday. Please note that voicemails left after 4:00 p.m. may not be returned until the following business day.  We are closed weekends and major holidays. You have access to a nurse at all times for urgent questions. Please call the main number to the clinic [phone] and follow the prompts.  For any non-urgent questions, you may also contact your provider using MyChart. We now offer e-Visits for anyone 18 and older to request care online for non-urgent symptoms. For details visit [email or web address].   Also download the MyChart app! Go to the app store, search "MyChart", open the app, select [facility], and log in with your MyChart username and password.  Due to Covid, a mask is required upon entering the hospital/clinic. If you do not have a mask, one will be given to you upon arrival. For doctor visits, patients may have 1 support person aged 18 or older with them. For treatment visits, patients cannot have anyone with them due to current Covid guidelines and our immunocompromised population.  

## 2020-12-16 NOTE — Progress Notes (Signed)
Subjective:   ANIS DEGIDIO is a 75 y.o. male who presents for Medicare Annual/Subsequent preventive examination.      Review of Systems    N/A Cardiac Risk Factors include: male gender;advanced age (>47men, >41 women);diabetes mellitus;hypertension;dyslipidemia     Objective:    Today's Vitals   12/17/20 1348  BP: (!) 124/54  Pulse: 73  Temp: (!) 97.2 F (36.2 C)  TempSrc: Temporal  SpO2: 95%  Weight: 187 lb 8 oz (85 kg)   Body mass index is 26.9 kg/m.  Advanced Directives 12/17/2020 11/06/2020 10/11/2020 10/09/2020 09/05/2020 08/22/2020 08/06/2020  Does Patient Have a Medical Advance Directive? Yes Yes Yes Yes Yes Yes No  Type of Paramedic of [place];Living will Living will;Healthcare Power of Attorney Living will Healthcare Power of [place];Living will [place];Living will -  Does patient want to make changes to medical advance directive? No - Patient declined No - Patient declined - - No - Patient declined No - Patient declined No - Patient declined  Copy of [place] in Chart? Yes - validated most recent copy scanned in chart (See row information) No - copy requested - No - copy requested No - copy requested No - copy requested -  Would patient like information on creating a medical advance directive? - - - - No - Patient declined No - Patient declined No - Patient declined  Pre-existing out of facility DNR order (yellow form or pink MOST form) - - - - - - -    Current Medications (verified) Outpatient Encounter Medications as of 12/17/2020  Medication Sig  . acetaminophen (TYLENOL) 650 MG CR tablet Take 650 mg by mouth every 8 (eight) hours as needed for pain.  [name] allopurinol (ZYLOPRIM) 300 MG tablet Take 1 tablet (300 mg total) by mouth daily. Start this after gout flare.  [name] atorvastatin (LIPITOR) 20 MG tablet Take 1 tablet (20 mg total) by mouth daily.  . BD PEN NEEDLE NANO  2ND GEN 32G X 4 MM MISC Use with insulin pen qid.  . clotrimazole-betamethasone (LOTRISONE) cream Apply 1 application topically 2 (two) times daily. (Patient taking differently: Apply 1 application topically 2 (two) times daily as needed (irritation).)  . diltiazem (CARDIZEM CD) 360 MG 24 hr capsule TAKE 1 CAPSULE BY MOUTH EVERY DAY  . JARDIANCE 10 MG TABS tablet TAKE 1 TABLET (10 MG TOTAL) BY MOUTH DAILY BEFORE BREAKFAST. (Patient taking differently: Take 10 mg by mouth daily.)  . LEVEMIR FLEXTOUCH 100 UNIT/ML FlexPen Inject 22 Units into the skin at bedtime.  . metoprolol tartrate (LOPRESSOR) 25 MG tablet TAKE 1 TABLET BY MOUTH TWICE A DAY (Patient taking differently: Take 25 mg by mouth 2 (two) times daily.)  . NOVOLOG FLEXPEN 100 UNIT/ML FlexPen INJECT 15 UNITS INTO THE SKIN 3 (THREE) TIMES DAILY WITH MEALS  . ONETOUCH ULTRA test strip 1 each 2 (two) times daily.  . potassium chloride (KLOR-CON) 10 MEQ tablet TAKE 1 TABLET BY MOUTH EVERY DAY  . sertraline (ZOLOFT) 100 MG tablet Take 1 tab p.o. qd (Patient taking differently: Take 100 mg by mouth daily. Take 1 tab p.o. qd)  . torsemide (DEMADEX) 20 MG tablet Take 1 tablet (20 mg total) by mouth 2 (two) times daily.  Roseanne Kaufman [place]-Castor Oil (VENELEX) OINT Apply 1 application topically 3 (three) times daily as needed (irritation). Apply to sacrum and bilateral buttocks (Patient not taking: Reported on 12/17/2020)  . PLENVU 140 g  SOLR See admin instructions. (Patient not taking: Reported on 12/17/2020)  . Vitamin D, Ergocalciferol, (DRISDOL) 1.25 MG (50000 UNIT) CAPS capsule Take 1 capsule (50,000 Units total) by mouth every 7 (seven) days. (Patient not taking: Reported on 12/17/2020)   No facility-administered encounter medications on file as of 12/17/2020.    Allergies (verified) Demerol, Vasotec [enalapril], and Lasix [furosemide]   History: Past Medical History:  Diagnosis Date  . Anxiety   . Arthritis   . Asthma   . Atrial fibrillation  ([address])    a. s/p DCCV in 03/2019  . CAD (coronary artery disease) 06/29/2019  . Cramps of left lower extremity   . Depression   . Diabetes mellitus    type 2 for 7-8 yrs  . Dyspnea   . Elevated liver enzymes   . Fatty liver   . Gout   . History of kidney stones   . Hyperlipidemia   . Hypertension   . Renal insufficiency   . Vertigo    Past Surgical History:  Procedure Laterality Date  . BIOPSY  10/11/2020   Procedure: BIOPSY;  Surgeon: Montez Morita, Quillian Quince, MD;  [facility]: [facility];  Service: Gastroenterology;;  . CARDIOVERSION N/A 04/21/2019   Procedure: CARDIOVERSION;  Surgeon: Sanda Klein, MD;  [facility];  Service: Cardiovascular;  Laterality: N/A;  . CARPAL TUNNEL RELEASE     both wrist  . cataract surgery     bilateral  . CHOLECYSTECTOMY    . COLONOSCOPY  2011  . COLONOSCOPY WITH PROPOFOL N/A 10/11/2020   Procedure: COLONOSCOPY WITH PROPOFOL;  Surgeon: Harvel Quale, MD;  [facility]: [facility];  Service: Gastroenterology;  Laterality: N/A;  AM  . ESOPHAGOGASTRODUODENOSCOPY (EGD) WITH PROPOFOL N/A 10/11/2020   Procedure: ESOPHAGOGASTRODUODENOSCOPY (EGD) WITH PROPOFOL;  Surgeon: Harvel Quale, MD;  [facility];  Service: Gastroenterology;  Laterality: N/A;  . EYE SURGERY    . HEMORROIDECTOMY    . KNEE SURGERY Left   . LEFT HEART CATH AND CORONARY ANGIOGRAPHY N/A 05/23/2019   Procedure: LEFT HEART CATH AND CORONARY ANGIOGRAPHY;  Surgeon: Belva Crome, MD;  Location: [facility];  Service: Cardiovascular;  Laterality: N/A;  . LUMBAR LAMINECTOMY/DECOMPRESSION MICRODISCECTOMY N/A 08/06/2016   Procedure: LUMBAR THREE- LUMBAR FIVE  DECOMPRESSIVE LUMBAR LAMINECTOMY;  Surgeon: Jovita Gamma, MD;  Location: [facility];  Service: Neurosurgery;  Laterality: N/A;  . POLYPECTOMY  10/11/2020   Procedure: POLYPECTOMY;  Surgeon: Harvel Quale, MD;  [facility];  Service: Gastroenterology;;   Family  History  Problem Relation Age of Onset  . Diabetes Mother   . Cancer Mother        unknown kind  . Diabetes Sister   . COPD Sister   . Liver disease Brother   . COPD Brother   . Diabetes Maternal Grandmother   . Diabetes Brother   . COPD Sister   . Healthy Daughter   . Healthy Daughter   . Colon cancer Neg Hx    Social History   Socioeconomic History  . Marital status: Divorced    Spouse name: Not on file  . Number of children: 2  . Years of education: Not on file  . Highest education level: Not on file  Occupational History  . Occupation: RETIRED  Tobacco Use  . Smoking status: Former Smoker    Packs/day: 4.00    Years: 20.00    Pack years: 80.00    Types: Cigarettes    Quit date: 10/14/1982  Years since quitting: 38.2  . Smokeless tobacco: Never Used  . Tobacco comment: 29 yrs ago  Vaping Use  . Vaping Use: Never used  Substance and Sexual Activity  . Alcohol use: No  . Drug use: No  . Sexual activity: Not Currently    Birth control/protection: None  Other Topics Concern  . Not on file  Social History Narrative  . Not on file   Social Determinants of Health   Financial Resource Strain: Medium Risk  . Difficulty of Paying Living Expenses: Somewhat hard  Food Insecurity: No Food Insecurity  . Worried About Charity fundraiser in the Last Year: Never true  . Ran Out of Food in the Last Year: Never true  Transportation Needs: No Transportation Needs  . Lack of Transportation (Medical): No  . Lack of Transportation (Non-Medical): No  Physical Activity: Insufficiently Active  . Days of Exercise per Week: 4 days  . Minutes of Exercise per Session: 10 min  Stress: No Stress Concern Present  . Feeling of Stress : Not at all  Social Connections: Moderately Isolated  . Frequency of Communication with Friends and Family: More than three times a week  . Frequency of Social Gatherings with Friends and Family: More than three times a week  . Attends Religious  Services: More than 4 times per year  . Active Member of Clubs or Organizations: No  . Attends Archivist Meetings: Never  . Marital Status: Separated    Tobacco Counseling Counseling given: Not Answered Comment: 29 yrs ago   Clinical Intake:  Pre-visit preparation completed: Yes  Pain : No/denies pain     Nutritional Risks: Nausea/ vomitting/ diarrhea (vomiting x1 in past two months) Diabetes: Yes CBG done?: No Did pt. bring in CBG monitor from home?: No  How often do you need to have someone help you when you read instructions, pamphlets, or other written materials from your doctor or pharmacy?: 3 - Sometimes  Diabetic?Yes Nutrition Risk Assessment:  Has the patient had any N/V/D within the last 2 months?  Yes  Does the patient have any non-healing wounds?  No  Has the patient had any unintentional weight loss or weight gain?  No   Diabetes:  Is the patient diabetic?  Yes  If diabetic, was a CBG obtained today?  No  Did the patient bring in their glucometer from home?  No  How often do you monitor your CBG's? Patient states checks glucose once per day in the morning.   Financial Strains and Diabetes Management:  Are you having any financial strains with the device, your supplies or your medication? No .  Does the patient want to be seen by Chronic Care Management for management of their diabetes?  No  Would the patient like to be referred to a Nutritionist or for Diabetic Management?  No   Diabetic Exams:  Diabetic Eye Exam: Overdue for diabetic eye exam. Pt has been advised about the importance in completing this exam. Patient advised to call and schedule an eye exam. Diabetic Foot Exam: Overdue, Pt has been advised about the importance in completing this exam. Pt is scheduled for diabetic foot exam on 02/25/2021.   Interpreter Needed?: No  Information entered by :: [place] of Daily Living In your present state of health, do you  have any difficulty performing the following activities: 12/17/2020 10/09/2020  Hearing? Y Y  Comment - bilateral hearing aids  Vision? N N  Difficulty concentrating or  making decisions? Y N  Comment gets forgetful sometimes -  Walking or climbing stairs? N Y  Dressing or bathing? N N  Doing errands, shopping? Y -  Conservation officer, nature and eating ? Y -  Comment preparing meals -  Using the Toilet? N -  In the past six months, have you accidently leaked urine? Y -  Do you have problems with loss of bowel control? N -  Managing your Medications? N -  Managing your Finances? N -  Housekeeping or managing your Housekeeping? N -  Some recent data might be hidden    Patient Care Team: Erven Colla, DO as PCP - General (Family Medicine)  Indicate any recent Medical Services you may have received from other than [facility] providers in the past year (date may be approximate).     Assessment:   This is a routine wellness examination for [facility]  Hearing/Vision screen  Hearing Screening   125Hz  250Hz  500Hz  1000Hz  2000Hz  3000Hz  4000Hz  6000Hz  8000Hz   Right ear:           Left ear:           Vision Screening Comments: Gets eyes examined once per year   Dietary issues and exercise activities discussed: Current Exercise Habits: Home exercise routine, Type of exercise: stretching, Time (Minutes): 15, Frequency (Times/Week): 4, Weekly Exercise (Minutes/Week): 60, Intensity: Mild  Goals Addressed            This Visit's Progress   . Prevent falls        Depression Screen PHQ 2/9 Scores 12/17/2020 11/21/2020 07/16/2020 12/12/2019 11/27/2019 05/03/2018 03/23/2017  PHQ - 2 Score 0 0 2 5 0 0 2  PHQ- 9 Score - - 13 14 - - 8    Fall Risk Fall Risk  12/17/2020 11/21/2020 07/16/2020 06/18/2020 12/12/2019  Falls in the past year? 1 0 1 1 0  Number falls in past yr: 1 - 1 1 -  Injury with Fall? 1 - 1 1 -  Risk for fall due to : Impaired balance/gait;History of fall(s) No Fall Risks History of fall(s)  History of fall(s);Impaired balance/gait -  Follow up Falls evaluation completed;Falls prevention discussed Falls evaluation completed Falls evaluation completed;Education provided Falls evaluation completed;Education provided Falls evaluation completed    FALL RISK PREVENTION PERTAINING TO THE HOME:  Any stairs in or around the home? Yes  If so, are there any without handrails? No  Home free of loose throw rugs in walkways, pet beds, electrical cords, etc? Yes  Adequate lighting in your home to reduce risk of falls? Yes   ASSISTIVE DEVICES UTILIZED TO PREVENT FALLS:  Life alert? No  Use of a cane, walker or w/c? Yes  Grab bars in the bathroom? Yes  Shower chair or bench in shower? Yes  Elevated toilet seat or a handicapped toilet? No   Cognitive Function:     6CIT Screen 12/17/2020  What Year? 0 points  What month? 0 points  What time? 0 points  Count back from 20 0 points  Months in reverse 0 points  Repeat phrase 0 points  Total Score 0    Immunizations Immunization History  Administered Date(s) Administered  . PFIZER(Purple Top)SARS-COV-2 Vaccination 09/19/2019, 10/10/2019, 05/16/2020    TDAP status: Due, Education has been provided regarding the importance of this vaccine. Advised may receive this vaccine at local pharmacy or Health Dept. Aware to provide a copy of the vaccination record if obtained from local pharmacy or Health Dept. Verbalized acceptance  and understanding.  Flu Vaccine status: Due, Education has been provided regarding the importance of this vaccine. Advised may receive this vaccine at local pharmacy or Health Dept. Aware to provide a copy of the vaccination record if obtained from local pharmacy or Health Dept. Verbalized acceptance and understanding.  Pneumococcal vaccine status: Due, Education has been provided regarding the importance of this vaccine. Advised may receive this vaccine at local pharmacy or Health Dept. Aware to provide a copy of the  vaccination record if obtained from local pharmacy or Health Dept. Verbalized acceptance and understanding.  Covid-19 vaccine status: Completed vaccines  Qualifies for Shingles Vaccine? Yes   Zostavax completed No   Shingrix Completed?: No.    Education has been provided regarding the importance of this vaccine. Patient has been advised to call insurance company to determine out of pocket expense if they have not yet received this vaccine. Advised may also receive vaccine at local pharmacy or Health Dept. Verbalized acceptance and understanding.  Screening Tests Health Maintenance  Topic Date Due  . OPHTHALMOLOGY EXAM  10/19/2017  . FOOT EXAM  05/04/2019  . HEMOGLOBIN A1C  05/23/2021  . COLONOSCOPY (Pts 45-35yrs Insurance coverage will need to be confirmed)  10/11/2021  . URINE MICROALBUMIN  11/21/2021  . COVID-19 Vaccine  Completed  . Hepatitis C Screening  Completed  . HPV VACCINES  Aged Out  . INFLUENZA VACCINE  Discontinued  . TETANUS/TDAP  Discontinued  . PNA vac Low Risk Adult  Discontinued    Health Maintenance  Health Maintenance Due  Topic Date Due  . OPHTHALMOLOGY EXAM  10/19/2017  . FOOT EXAM  05/04/2019    Colorectal cancer screening: Type of screening: Colonoscopy. Completed 10/11/2020. Repeat every 1 years  Lung Cancer Screening: (Low Dose CT Chest recommended if Age 37-80 years, 30 pack-year currently smoking OR have quit w/in 15years.) does not qualify.   Lung Cancer Screening Referral: N/A  Additional Screening:  Hepatitis C Screening: does qualify; Completed 06/25/2020   Vision Screening: Recommended annual ophthalmology exams for early detection of glaucoma and other disorders of the eye. Is the patient up to date with their annual eye exam?  Yes  Who is the provider or what is the name of the office in which the patient attends annual eye exams? Dr. Jorja Loa If pt is not established with a provider, would they like to be referred to a provider to  establish care? No .   Dental Screening: Recommended annual dental exams for proper oral hygiene  Community Resource Referral / Chronic Care Management: CRR required this visit?  Yes  CCM required this visit?  No      Plan:     I have personally reviewed and noted the following in the patient's chart:   . Medical and social history . Use of alcohol, tobacco or illicit drugs  . Current medications and supplements including opioid prescriptions. Patient is not currently taking opioid prescriptions. . Functional ability and status . Nutritional status . Physical activity . Advanced directives . List of other physicians . Hospitalizations, surgeries, and ER visits in previous 12 months . Vitals . Screenings to include cognitive, depression, and falls . Referrals and appointments  In addition, I have reviewed and discussed with patient certain preventive protocols, quality metrics, and best practice recommendations. A written personalized care plan for preventive services as well as general preventive health recommendations were provided to patient.     Ofilia Neas, LPN   9/79/8921   Nurse Notes: Patient  is currently having some financial issues paying for medications

## 2020-12-17 ENCOUNTER — Ambulatory Visit (INDEPENDENT_AMBULATORY_CARE_PROVIDER_SITE_OTHER): Payer: Medicare HMO

## 2020-12-17 ENCOUNTER — Other Ambulatory Visit: Payer: Self-pay

## 2020-12-17 VITALS — BP 124/54 | HR 73 | Temp 97.2°F | Wt 187.5 lb

## 2020-12-17 DIAGNOSIS — Z599 Problem related to housing and economic circumstances, unspecified: Secondary | ICD-10-CM

## 2020-12-17 DIAGNOSIS — Z0000 Encounter for general adult medical examination without abnormal findings: Secondary | ICD-10-CM

## 2020-12-17 NOTE — Patient Instructions (Signed)
[name] , Thank you for taking time to come for your Medicare Wellness Visit. I appreciate your ongoing commitment to your health goals. Please review the following plan we discussed and let me know if I can assist you in the future.   Screening recommendations/referrals: Colonoscopy: Up to date, next due 10/11/2021 Recommended yearly ophthalmology/optometry visit for glaucoma screening and checkup Recommended yearly dental visit for hygiene and checkup  Vaccinations: Influenza vaccine: Declined  Pneumococcal vaccine: Declined  Tdap vaccine: Declined  Shingles vaccine: Declined     Advanced directives: Copies on file   Conditions/risks identified: None   Next appointment: 12/23/2021 @ 1:40 PM with Nurse Health Advisor   Preventive Care 42 Years and Older, Male Preventive care refers to lifestyle choices and visits with your health care provider that can promote health and wellness. What does preventive care include?  A yearly physical exam. This is also called an annual well check.  Dental exams once or twice a year.  Routine eye exams. Ask your health care provider how often you should have your eyes checked.  Personal lifestyle choices, including:  Daily care of your teeth and gums.  Regular physical activity.  Eating a healthy diet.  Avoiding tobacco and drug use.  Limiting alcohol use.  Practicing safe sex.  Taking low doses of aspirin every day.  Taking vitamin and mineral supplements as recommended by your health care provider. What happens during an annual well check? The services and screenings done by your health care provider during your annual well check will depend on your age, overall health, lifestyle risk factors, and family history of disease. Counseling  Your health care provider may ask you questions about your:  Alcohol use.  Tobacco use.  Drug use.  Emotional well-being.  Home and relationship well-being.  Sexual activity.  Eating  habits.  History of falls.  Memory and ability to understand (cognition).  Work and work Statistician. Screening  You may have the following tests or measurements:  Height, weight, and BMI.  Blood pressure.  Lipid and cholesterol levels. These may be checked every 5 years, or more frequently if you are over 34 years old.  Skin check.  Lung cancer screening. You may have this screening every year starting at age 55 if you have a 30-pack-year history of smoking and currently smoke or have quit within the past 15 years.  Fecal occult blood test (FOBT) of the stool. You may have this test every year starting at age 3.  Flexible sigmoidoscopy or colonoscopy. You may have a sigmoidoscopy every 5 years or a colonoscopy every 10 years starting at age 51.  Prostate cancer screening. Recommendations will vary depending on your family history and other risks.  Hepatitis C blood test.  Hepatitis B blood test.  Sexually transmitted disease (STD) testing.  Diabetes screening. This is done by checking your blood sugar (glucose) after you have not eaten for a while (fasting). You may have this done every 1-3 years.  Abdominal aortic aneurysm (AAA) screening. You may need this if you are a current or former smoker.  Osteoporosis. You may be screened starting at age 79 if you are at high risk. Talk with your health care provider about your test results, treatment options, and if necessary, the need for more tests. Vaccines  Your health care provider may recommend certain vaccines, such as:  Influenza vaccine. This is recommended every year.  Tetanus, diphtheria, and acellular pertussis (Tdap, Td) vaccine. You may need a Td booster  every 10 years.  Zoster vaccine. You may need this after age 47.  Pneumococcal 13-valent conjugate (PCV13) vaccine. One dose is recommended after age 67.  Pneumococcal polysaccharide (PPSV23) vaccine. One dose is recommended after age 79. Talk to your health  care provider about which screenings and vaccines you need and how often you need them. This information is not intended to replace advice given to you by your health care provider. Make sure you discuss any questions you have with your health care provider. Document Released: 08/09/2015 Document Revised: 04/01/2016 Document Reviewed: 05/14/2015 Elsevier Interactive Patient Education  2017 [employer] Prevention in the Home Falls can cause injuries. They can happen to people of all ages. There are many things you can do to make your home safe and to help prevent falls. What can I do on the outside of my home?  Regularly fix the edges of walkways and driveways and fix any cracks.  Remove anything that might make you trip as you walk through a door, such as a raised step or threshold.  Trim any bushes or trees on the path to your home.  Use bright outdoor lighting.  Clear any walking paths of anything that might make someone trip, such as rocks or tools.  Regularly check to see if handrails are loose or broken. Make sure that both sides of any steps have handrails.  Any raised decks and porches should have guardrails on the edges.  Have any leaves, snow, or ice cleared regularly.  Use sand or salt on walking paths during winter.  Clean up any spills in your garage right away. This includes oil or grease spills. What can I do in the bathroom?  Use night lights.  Install grab bars by the toilet and in the tub and shower. Do not use towel bars as grab bars.  Use non-skid mats or decals in the tub or shower.  If you need to sit down in the shower, use a plastic, non-slip stool.  Keep the floor dry. Clean up any water that spills on the floor as soon as it happens.  Remove soap buildup in the tub or shower regularly.  Attach bath mats securely with double-sided non-slip rug tape.  Do not have throw rugs and other things on the floor that can make you trip. What can I do  in the bedroom?  Use night lights.  Make sure that you have a light by your bed that is easy to reach.  Do not use any sheets or blankets that are too big for your bed. They should not hang down onto the floor.  Have a firm chair that has side arms. You can use this for support while you get dressed.  Do not have throw rugs and other things on the floor that can make you trip. What can I do in the kitchen?  Clean up any spills right away.  Avoid walking on wet floors.  Keep items that you use a lot in easy-to-reach places.  If you need to reach something above you, use a strong step stool that has a grab bar.  Keep electrical cords out of the way.  Do not use floor polish or wax that makes floors slippery. If you must use wax, use non-skid floor wax.  Do not have throw rugs and other things on the floor that can make you trip. What can I do with my stairs?  Do not leave any items on the stairs.  Make  sure that there are handrails on both sides of the stairs and use them. Fix handrails that are broken or loose. Make sure that handrails are as long as the stairways.  Check any carpeting to make sure that it is firmly attached to the stairs. Fix any carpet that is loose or worn.  Avoid having throw rugs at the top or bottom of the stairs. If you do have throw rugs, attach them to the floor with carpet tape.  Make sure that you have a light switch at the top of the stairs and the bottom of the stairs. If you do not have them, ask someone to add them for you. What else can I do to help prevent falls?  Wear shoes that:  Do not have high heels.  Have rubber bottoms.  Are comfortable and fit you well.  Are closed at the toe. Do not wear sandals.  If you use a stepladder:  Make sure that it is fully opened. Do not climb a closed stepladder.  Make sure that both sides of the stepladder are locked into place.  Ask someone to hold it for you, if possible.  Clearly mark  and make sure that you can see:  Any grab bars or handrails.  First and last steps.  Where the edge of each step is.  Use tools that help you move around (mobility aids) if they are needed. These include:  Canes.  Walkers.  Scooters.  Crutches.  Turn on the lights when you go into a dark area. Replace any light bulbs as soon as they burn out.  Set up your furniture so you have a clear path. Avoid moving your furniture around.  If any of your floors are uneven, fix them.  If there are any pets around you, be aware of where they are.  Review your medicines with your doctor. Some medicines can make you feel dizzy. This can increase your chance of falling. Ask your doctor what other things that you can do to help prevent falls. This information is not intended to replace advice given to you by your health care provider. Make sure you discuss any questions you have with your health care provider. Document Released: 05/09/2009 Document Revised: 12/19/2015 Document Reviewed: 08/17/2014 Elsevier Interactive Patient Education  2017 [employer].

## 2020-12-18 ENCOUNTER — Telehealth: Payer: Self-pay | Admitting: Family Medicine

## 2020-12-18 NOTE — Telephone Encounter (Signed)
   Telephone encounter was:  Successful.  12/18/2020 Name: Jack Mejia MRN: [id number] DOB: 1946-05-09  Jack Mejia is a 75 y.o. year old male who is a primary care patient of Erven Colla, DO . The community resource team was consulted for assistance with Financial Difficulties related to paying for medications  Care guide performed the following interventions: Patient provided with information about care guide support team and interviewed to confirm resource needs Discussed resources to assist with finding more cost effective ways to pay for his medication, two of his diabetic medications costs over $600. I sent patient the website for [employer] and [employer] so that they can see if he qualifies for any of those programs. I also will send them manufacturer information on possible discounts directly from them. These applications will need to be filled out by the doctor as well as the pt. .  Follow Up Plan:  Care guide will follow up with patient by phone over the next few days  [place], Care Management Phone: [phone] Email: [email or web address]

## 2020-12-19 ENCOUNTER — Telehealth: Payer: Self-pay

## 2020-12-19 NOTE — Telephone Encounter (Signed)
Form placed in Nurses box to be completed to apply for help to get discount on insuline.  Pt call back [phone]

## 2020-12-25 ENCOUNTER — Encounter (HOSPITAL_COMMUNITY): Payer: Self-pay | Admitting: Hematology

## 2020-12-25 NOTE — Progress Notes (Signed)
Cardiology Office Note  Date: 12/26/2020   ID: Jack Mejia, DOB July 22, 1946, MRN [id number]  PCP:  Erven Colla, DO  Cardiologist:  None Electrophysiologist:  None   Chief Complaint: Follow-up dyspnea on exertion, PAF, chronic diastolic heart failure, CAD  History of Present Illness: Jack Mejia is a 75 y.o. male with a history of dyspnea on exertion, atrial fibrillation (status post DCCV 03/2019), CAD, HLD, HTN, DM 2, chronic diastolic heart failure, CKD stage III.   At last follow-up he was feeling much better.  He had lost some weight.  Continued to have some shortness of breath on exertional activity but he was not very active.  Denied any edema or significant weight gain.  His weight was 187 which appeared to be his baseline.  He was continuing torsemide p.o. twice daily.  Denied any palpitations or arrhythmias. Heart rate was controlled and regular without amiodarone. Continuing diltiazem 360 mg daily and metoprolol 25 mg p.o. twice daily.  He is here today for 3 month follow up. He states he is feeling much better and denies any HF failure symptoms such as SOB / DOE, weight gain, edema. No complaints of palpitations. No anginal symptoms, PND or orthopnea. No bleeding issues. He is seeing Hematology for anemia and receiving Retacrit injections. His glucose is much better managed per he and wife's statement. His weight is stable. His cardiac medication regimen includes Metoprolol 25 mg po bid, torsemide 20 mg po bid with potassium supplementation. He has not started anticoagulation yet due to recent GIB. He is waiting for instructions from GI. He denies any palpitations or arrhythmias. He continues  diltiazem 360 mg po daily for atrial fibrillation. He continues Lipitor 20 mg po daily.  He recently underwent colonoscopy and EGD on 10/11/2020 by Dr Jenetta Downer 10/11/2020 for anemia and GIB. Colonoscopy revealed 10 mm polyp in transverse colon and was removed. Two other sessile polyps  in transverse colon were 41mm and 103mm and were removed. 15 mm polyp in the rectum removed. EGD normal esophagus, stomach, and duodenum.    Past Medical History:  Diagnosis Date  . Anxiety   . Arthritis   . Asthma   . Atrial fibrillation ([facility])    a. s/p DCCV in 03/2019  . CAD (coronary artery disease) 06/29/2019  . Cramps of left lower extremity   . Depression   . Diabetes mellitus    type 2 for 7-8 yrs  . Dyspnea   . Elevated liver enzymes   . Fatty liver   . Gout   . History of kidney stones   . Hyperlipidemia   . Hypertension   . Renal insufficiency   . Vertigo     Past Surgical History:  Procedure Laterality Date  . BIOPSY  10/11/2020   Procedure: BIOPSY;  Surgeon: Montez Morita, Quillian Quince, MD;  [facility];  Service: Gastroenterology;;  . CARDIOVERSION N/A 04/21/2019   Procedure: CARDIOVERSION;  Surgeon: Sanda Klein, MD;  [facility];  Service: Cardiovascular;  Laterality: N/A;  . CARPAL TUNNEL RELEASE     both wrist  . cataract surgery     bilateral  . CHOLECYSTECTOMY    . COLONOSCOPY  2011  . COLONOSCOPY WITH PROPOFOL N/A 10/11/2020   Procedure: COLONOSCOPY WITH PROPOFOL;  Surgeon: Harvel Quale, MD;  [facility];  Service: Gastroenterology;  Laterality: N/A;  AM  . ESOPHAGOGASTRODUODENOSCOPY (EGD) WITH PROPOFOL N/A 10/11/2020   Procedure: ESOPHAGOGASTRODUODENOSCOPY (EGD) WITH PROPOFOL;  Surgeon: Montez Morita,  Quillian Quince, MD;  [facility];  Service: Gastroenterology;  Laterality: N/A;  . EYE SURGERY    . HEMORROIDECTOMY    . KNEE SURGERY Left   . LEFT HEART CATH AND CORONARY ANGIOGRAPHY N/A 05/23/2019   Procedure: LEFT HEART CATH AND CORONARY ANGIOGRAPHY;  Surgeon: Belva Crome, MD;  Location: [facility];  Service: Cardiovascular;  Laterality: N/A;  . LUMBAR LAMINECTOMY/DECOMPRESSION MICRODISCECTOMY N/A 08/06/2016   Procedure: LUMBAR THREE- LUMBAR FIVE  DECOMPRESSIVE LUMBAR LAMINECTOMY;   Surgeon: Jovita Gamma, MD;  Location: [facility];  Service: Neurosurgery;  Laterality: N/A;  . POLYPECTOMY  10/11/2020   Procedure: POLYPECTOMY;  Surgeon: Harvel Quale, MD;  [facility]: [facility];  Service: Gastroenterology;;    Current Outpatient Medications  Medication Sig Dispense Refill  . acetaminophen (TYLENOL) 650 MG CR tablet Take 650 mg by mouth every 8 (eight) hours as needed for pain.    [name] allopurinol (ZYLOPRIM) 300 MG tablet Take 1 tablet (300 mg total) by mouth daily. Start this after gout flare. 90 tablet 1  . atorvastatin (LIPITOR) 20 MG tablet Take 1 tablet (20 mg total) by mouth daily. 30 tablet 2  . Balsam [place]-Castor Oil (VENELEX) OINT Apply 1 application topically 3 (three) times daily as needed (irritation). Apply to sacrum and bilateral buttocks    . BD PEN NEEDLE NANO 2ND GEN 32G X 4 MM MISC Use with insulin pen qid. 120 each 5  . clotrimazole-betamethasone (LOTRISONE) cream Apply 1 application topically 2 (two) times daily. (Patient taking differently: Apply 1 application topically 2 (two) times daily as needed (irritation).) 30 g 3  . diltiazem (CARDIZEM CD) 360 MG 24 hr capsule TAKE 1 CAPSULE BY MOUTH EVERY DAY 90 capsule 3  . JARDIANCE 10 MG TABS tablet TAKE 1 TABLET (10 MG TOTAL) BY MOUTH DAILY BEFORE BREAKFAST. (Patient taking differently: Take 10 mg by mouth daily.) 30 tablet 5  . LEVEMIR FLEXTOUCH 100 UNIT/ML FlexPen Inject 22 Units into the skin at bedtime. 15 mL 1  . metoprolol tartrate (LOPRESSOR) 25 MG tablet TAKE 1 TABLET BY MOUTH TWICE A DAY (Patient taking differently: Take 25 mg by mouth 2 (two) times daily.) 180 tablet 1  . NOVOLOG FLEXPEN 100 UNIT/ML FlexPen INJECT 15 UNITS INTO THE SKIN 3 (THREE) TIMES DAILY WITH MEALS 15 mL 0  . ONETOUCH ULTRA test strip 1 each 2 (two) times daily.    . potassium chloride (KLOR-CON) 10 MEQ tablet TAKE 1 TABLET BY MOUTH EVERY DAY 90 tablet 1  . sertraline (ZOLOFT) 100 MG tablet Take 1 tab p.o. qd (Patient  taking differently: Take 100 mg by mouth daily. Take 1 tab p.o. qd) 90 tablet 1  . torsemide (DEMADEX) 20 MG tablet Take 1 tablet (20 mg total) by mouth 2 (two) times daily. 60 tablet 6   No current facility-administered medications for this visit.   Allergies:  Demerol, Vasotec [enalapril], and Lasix [furosemide]   Social History: The patient  reports that he quit smoking about 38 years ago. His smoking use included cigarettes. He has a 80.00 pack-year smoking history. He has never used smokeless tobacco. He reports that he does not drink alcohol and does not use drugs.   Family History: The patient's family history includes COPD in his brother, sister, and sister; Cancer in his mother; Diabetes in his brother, maternal grandmother, mother, and sister; Healthy in his daughter and daughter; Liver disease in his brother.   ROS:  Please see the history of present illness.  Otherwise, complete review of systems is positive for none.  All other systems are reviewed and negative.   Physical Exam: VS:  BP 138/62   Pulse (!) 59   Ht 5\' 10"  (1.778 m)   Wt 188 lb 12.8 oz (85.6 kg)   SpO2 96%   BMI 27.09 kg/m , BMI Body mass index is 27.09 kg/m.  Wt Readings from Last 3 Encounters:  12/26/20 188 lb 12.8 oz (85.6 kg)  12/17/20 187 lb 8 oz (85 kg)  11/21/20 188 lb (85.3 kg)    General: Patient appears comfortable at rest. Neck: Supple, no elevated JVP or carotid bruits, no thyromegaly. Lungs: Clear to auscultation, nonlabored breathing at rest. Cardiac: Regular rate and rhythm, no S3, systolic murmur 2/6 best heard at right upper sternal border.  Murmur, no pericardial rub. Extremities: No pitting edema, distal pulses 2+. Skin: Warm and dry. Musculoskeletal: No kyphosis. Neuropsychiatric: Alert and oriented x3, affect grossly appropriate.  ECG:  EKG November 01, 2019 sinus bradycardia nonspecific T wave abnormality, prolonged QT QT/QTc 498/476  Recent Labwork: 06/06/2020: TSH 0.490 06/30/2020:  Magnesium 1.9 08/06/2020: B Natriuretic Peptide 170.0 11/06/2020: ALT 21; AST 23; BUN 33; Creatinine, Ser 1.98; Potassium 3.8; Sodium 139 12/06/2020: Hemoglobin 11.5; Platelets 219     Component Value Date/Time   CHOL 173 05/04/2018 1056   TRIG 278 (H) 05/04/2018 1056   HDL 34 (L) 05/04/2018 1056   CHOLHDL 5.1 (H) 05/04/2018 1056   CHOLHDL 4.4 02/21/2014 0703   VLDL 38 02/21/2014 0703   LDLCALC 83 05/04/2018 1056    Other Studies Reviewed Today:  Echocardiogram 09/26/2020  1. Left ventricular ejection fraction, by estimation, is 60 to 65%. The left ventricle has normal function. The left ventricle has no regional wall motion abnormalities. There is mild left ventricular hypertrophy. Left ventricular diastolic parameters are consistent with Grade I diastolic dysfunction (impaired relaxation). 2. Right ventricular systolic function is normal. The right ventricular size is normal. There is normal pulmonary artery systolic pressure. The estimated right ventricular systolic pressure is 09.3 mmHg. 3. The mitral valve is grossly normal. Trivial mitral valve regurgitation. 4. The aortic valve is tricuspid. There is moderate calcification of the aortic valve. Aortic valve regurgitation is not visualized. Moderate aortic valve stenosis. Aortic valve mean gradient measures 18.8 mmHg. Aortic valve Vmax measures 2.80 m/s. Dimentionless index 0.34. 5. The inferior vena cava is normal in size with greater than 50% respiratory variability, suggesting right atrial pressure of 3 mmHg. Comparison(s): Echocardiogram done 05/08/20 showed an EF of 65-70% with moderate AS and an AV Peak Grad of 37.9 mm Hg.   NST 05/08/2020 Study Result  Narrative & Impression   No diagnostic ST segment changes to indicate ischemia.  Small, mild intensity, fixed basal inferoseptal defect that is most consistent with soft tissue attenuation. No large ischemic territories noted.  This is a low risk study.  Nuclear  stress EF: 57%.    Echocardiogram 05/08/2020 IMPRESSIONS  1. Left ventricular ejection fraction, by estimation, is 65 to 70%. The  left ventricle has normal function. The left ventricle has no regional  wall motion abnormalities. There is mild left ventricular hypertrophy.  Left ventricular diastolic parameters  are consistent with Grade I diastolic dysfunction (impaired relaxation).  2. Right ventricular systolic function is normal. The right ventricular  size is normal. There is mildly elevated pulmonary artery systolic  pressure. The estimated right ventricular systolic pressure is 26.7 mmHg.  3. The mitral valve is grossly normal. Trivial mitral valve  regurgitation.  4. The aortic valve is tricuspid. There is moderate calcification of the  aortic valve. Aortic valve regurgitation is not visualized. Moderate  aortic valve stenosis. Aortic valve mean gradient measures 21.5 mmHg.  Aortic valve Vmax measures 3.08 m/s.  Dimentionless index 0.36.  5. The inferior vena cava is normal in size with greater than 50%  respiratory variability, suggesting right atrial pressure of 3 mmHg.       Cardiac catheterization 05/23/2019:   Normal left main  30 to 40% proximal LAD, first diagonal 50% proximal and 70% distal, and diffuse 70% narrowing in the distal/apical LAD.  First obtuse marginal large in size with proximal segmental 50% narrowing and distal 90% narrowing.  Dominant RCA with 30 to 40% ostial to proximal narrowing, diffuse disease in the ostial to mid PDA 80 to 90%, and 80% segmental proximal second left ventricular branch.  Previous echo documentation of EF greater than 55%. After hydration today LVEDP 26 mmHg.  RECOMMENDATIONS:   Diffuse, predominantly distal coronary artery disease involving PDA, left ventricular branch, diagonal, distal LAD, and first obtuse marginal. Considering multifocality medical therapy is most appropriate especially in absence of any  proximal vessel disease. PCI is possible on the PDA, left ventricular branch, and obtuse marginal but each would require significant contrast and place the patient at risk for acute kidney injury.  Significant elevation in LVEDP and history of orthopnea at home suggest that diastolic heart failure needs therapy with additional diuresis.    Echocardiogram 02/22/2019:  1. The left ventricle has normal systolic function with an ejection  fraction of 60-65%. The cavity size was normal. There is moderately  increased left ventricular wall thickness. Left ventricular diastolic  Doppler parameters are indeterminate.  2. The right ventricle has normal systolic function. The cavity was  normal. There is no increase in right ventricular wall thickness.  3. No evidence of mitral valve stenosis.  4. The aortic valve is tricuspid. Mild thickening of the aortic valve.  Mild calcification of the aortic valve. No stenosis of the aortic valve.  Mild aortic annular calcification noted.  5. The aorta is normal in size and structure.  6. The aortic root is normal in size and structure.  7. Pulmonary hypertension is indeterminate, inadequate TR jet.    Assessment and Plan:   1. CAD in native artery History of CAD with diffuse predominant distal coronary artery disease involving PDA, left ventricular branch, diagonal, distal LAD and, first obtuse marginal.  Cath report 04/2019 stated PCI was possible on PDA left ventricular branch and OM but each would require significant contrast and place the patient at risk for kidney injury.  No current anginal symptoms. Continue metoprolol 25 mg p.o. twice daily.  Continue atorvastatin 20 mg daily. Previously not on aspirin due to systemic anti-coagulation.   2. Chronic diastolic heart failure ([facility])  Recent echocardiogram 09/26/2020 : EF 60-65%, Mild LVH, G1DD, moderate AV stenosis.  Denies any current DOE / SOB. States he has been walking some and feeling  better. Continue torsemide 20 mg p.o. twice daily with potassium supplementation . Weight today 188. Weight staying stable at 187-188 lbs.  3. Paroxysmal atrial fibrillation ([facility]) Current pulse is regular at 59 . Amiodarone previously discontinued during hospital stay secondary to suspicion of amiodarone pneumonitis. Eliquis was discontinued until he was seen by GI as an outpatient secondary to GIB and anemia. Continue Cardizem 360 mg p.o. daily. Will contact GI to make sure ok to re-start Eliquis.   4. Essential  hypertension BP 138/62 today  Continue Toprol 25 mg p.o. twice daily.    5. Stage III-IV chronic kidney disease Torsemide was recently increased back to 20 mg p.o. twice daily at discharge from recent hospital stay. Last Crt 1.98, GFR 35. Continue Torsemide 20 mg po bid.  6.  Moderate aortic stenosis Recent echo 09/26/2020 Moderate aortic valve stenosis. Mean gradient 18.8, dimentionless index 0.34. Previous echo 05/08/2020 Moderate AS with peak gradient 37.9 mmHg. No dizziness, near syncope / syncope. No anginal symptoms  7.  Type 2 diabetes uncontrolled Patient states his blood sugars are under better control with glucose ranging in the 130s-140s now. Follows with PCP.  8. Anemia Patient sees hematology Dr Marlene Bast and receives Retacrit injections monthly. Last Hgb 11.5 on 12/06/2020  Medication Adjustments/Labs and Tests Ordered: Current medicines are reviewed at length with the patient today.  Concerns regarding medicines are outlined above.   Disposition: Follow-up with [facility] or [facility]  6 months Signed, Levell July, NP 12/26/2020 8:23 PM    [facility], [place], [place] [address] Phone: [phone]; Fax: [phone]

## 2020-12-26 ENCOUNTER — Ambulatory Visit: Payer: Medicare HMO | Admitting: Family Medicine

## 2020-12-26 ENCOUNTER — Encounter: Payer: Self-pay | Admitting: Family Medicine

## 2020-12-26 VITALS — BP 138/62 | HR 59 | Ht 70.0 in | Wt 188.8 lb

## 2020-12-26 DIAGNOSIS — I2510 Atherosclerotic heart disease of native coronary artery without angina pectoris: Secondary | ICD-10-CM

## 2020-12-26 DIAGNOSIS — I480 Paroxysmal atrial fibrillation: Secondary | ICD-10-CM | POA: Diagnosis not present

## 2020-12-26 DIAGNOSIS — I350 Nonrheumatic aortic (valve) stenosis: Secondary | ICD-10-CM

## 2020-12-26 DIAGNOSIS — E119 Type 2 diabetes mellitus without complications: Secondary | ICD-10-CM | POA: Diagnosis not present

## 2020-12-26 DIAGNOSIS — I10 Essential (primary) hypertension: Secondary | ICD-10-CM | POA: Diagnosis not present

## 2020-12-26 DIAGNOSIS — N183 Chronic kidney disease, stage 3 unspecified: Secondary | ICD-10-CM | POA: Diagnosis not present

## 2020-12-26 DIAGNOSIS — I5032 Chronic diastolic (congestive) heart failure: Secondary | ICD-10-CM | POA: Diagnosis not present

## 2020-12-26 DIAGNOSIS — D509 Iron deficiency anemia, unspecified: Secondary | ICD-10-CM | POA: Diagnosis not present

## 2020-12-26 NOTE — Patient Instructions (Addendum)
Medication Instructions:  Continue all current medications.   Labwork: none  Testing/Procedures: none  Follow-Up: 6 months   Any Other Special Instructions Will Be Listed Below (If Applicable).   If you need a refill on your cardiac medications before your next appointment, please call your pharmacy.  

## 2020-12-27 ENCOUNTER — Telehealth: Payer: Self-pay | Admitting: Family Medicine

## 2020-12-27 MED ORDER — ELIQUIS 5 MG PO TABS: 5.0000 mg | ORAL_TABLET | Freq: Two times a day (BID) | ORAL | 6 refills | Status: DC

## 2020-12-27 NOTE — Addendum Note (Signed)
Addended by: Laurine Blazer on: 12/27/2020 02:25 PM   Modules accepted: Orders

## 2020-12-27 NOTE — Progress Notes (Signed)
Can you call [name] and tell him it is okay to restart his Eliquis back per Dr. Jenetta Downer the gastroenterologist.  Thank you

## 2020-12-27 NOTE — Progress Notes (Signed)
Thank you Dr. Jenetta Downer

## 2020-12-27 NOTE — Progress Notes (Signed)
[name] notified.  Will send new prescription to [facility] now.  She will call back if co-pay is more than he can afford.

## 2020-12-27 NOTE — Telephone Encounter (Signed)
   Telephone encounter was:  Successful.  12/27/2020 Name: ZALEN SEQUEIRA MRN: [id number] DOB: 1945/08/21  DONTARIUS SHELEY is a 75 y.o. year old male who is a primary care patient of Erven Colla, DO . The community resource team was consulted for assistance with Financial Difficulties related to medications  Care guide performed the following interventions: Follow up call placed to the patient to discuss status of referral and make sure that they received the information that I sent to him. Was confirmed and they are working on it. .  Follow Up Plan:  No further follow up planned at this time. The patient has been provided with needed resources.  [facility], Care Management Phone: [phone] Email: [email or web address]

## 2020-12-28 ENCOUNTER — Other Ambulatory Visit: Payer: Self-pay | Admitting: Family Medicine

## 2021-01-01 ENCOUNTER — Other Ambulatory Visit: Payer: Medicare HMO | Admitting: Urology

## 2021-01-03 ENCOUNTER — Inpatient Hospital Stay (HOSPITAL_COMMUNITY): Payer: Medicare HMO | Attending: Hematology and Oncology

## 2021-01-03 ENCOUNTER — Other Ambulatory Visit: Payer: Self-pay

## 2021-01-03 ENCOUNTER — Inpatient Hospital Stay (HOSPITAL_COMMUNITY): Payer: Medicare HMO

## 2021-01-03 DIAGNOSIS — R0609 Other forms of dyspnea: Secondary | ICD-10-CM | POA: Insufficient documentation

## 2021-01-03 DIAGNOSIS — I4891 Unspecified atrial fibrillation: Secondary | ICD-10-CM | POA: Diagnosis not present

## 2021-01-03 DIAGNOSIS — D539 Nutritional anemia, unspecified: Secondary | ICD-10-CM

## 2021-01-03 DIAGNOSIS — D46Z Other myelodysplastic syndromes: Secondary | ICD-10-CM | POA: Diagnosis not present

## 2021-01-03 DIAGNOSIS — N189 Chronic kidney disease, unspecified: Secondary | ICD-10-CM | POA: Insufficient documentation

## 2021-01-03 DIAGNOSIS — D509 Iron deficiency anemia, unspecified: Secondary | ICD-10-CM | POA: Diagnosis not present

## 2021-01-03 LAB — CBC
HCT: 34.2 % — ABNORMAL LOW (ref 39.0–52.0)
Hemoglobin: 11.3 g/dL — ABNORMAL LOW (ref 13.0–17.0)
MCH: 37.5 pg — ABNORMAL HIGH (ref 26.0–34.0)
MCHC: 33 g/dL (ref 30.0–36.0)
MCV: 113.6 fL — ABNORMAL HIGH (ref 80.0–100.0)
Platelets: 141 10*3/uL — ABNORMAL LOW (ref 150–400)
RBC: 3.01 MIL/uL — ABNORMAL LOW (ref 4.22–5.81)
RDW: 15.8 % — ABNORMAL HIGH (ref 11.5–15.5)
WBC: 4.4 10*3/uL (ref 4.0–10.5)
nRBC: 0 % (ref 0.0–0.2)

## 2021-01-03 LAB — IRON AND TIBC
Iron: 51 ug/dL (ref 45–182)
Saturation Ratios: 16 % — ABNORMAL LOW (ref 17.9–39.5)
TIBC: 313 ug/dL (ref 250–450)
UIBC: 262 ug/dL

## 2021-01-03 LAB — FOLATE: Folate: 16.5 ng/mL (ref >5.9)

## 2021-01-03 LAB — VITAMIN B12: Vitamin B-12: 465 pg/mL (ref 180–914)

## 2021-01-03 LAB — FERRITIN: Ferritin: 210 ng/mL (ref 24–336)

## 2021-01-03 NOTE — Progress Notes (Signed)
Hemoglobin 11.3, No Retacrit injection today per treatment parameters. No questions or concerns at this time. Discharged in satisfactory condition with no s/s of distress noted.

## 2021-01-06 DIAGNOSIS — D631 Anemia in chronic kidney disease: Secondary | ICD-10-CM | POA: Diagnosis not present

## 2021-01-06 DIAGNOSIS — N184 Chronic kidney disease, stage 4 (severe): Secondary | ICD-10-CM | POA: Diagnosis not present

## 2021-01-06 DIAGNOSIS — I129 Hypertensive chronic kidney disease with stage 1 through stage 4 chronic kidney disease, or unspecified chronic kidney disease: Secondary | ICD-10-CM | POA: Diagnosis not present

## 2021-01-06 DIAGNOSIS — N189 Chronic kidney disease, unspecified: Secondary | ICD-10-CM | POA: Diagnosis not present

## 2021-01-06 DIAGNOSIS — I5032 Chronic diastolic (congestive) heart failure: Secondary | ICD-10-CM | POA: Diagnosis not present

## 2021-02-02 NOTE — Progress Notes (Signed)
[name], [place] [address]   CLINIC:  Medical Oncology/Hematology  PCP:  Erven Colla, DO [address] / [place] [address]  [phone]  REASON FOR VISIT:  Follow-up for macrocytic anemia  PRIOR THERAPY: Intermittent Venofer  CURRENT THERAPY: Retacrit 30,000 units every 4 weeks  Virtual Visit via Video Note  I connected with [name] on $[email or web address]$ @ at  3:00 PM EDT by a video enabled telemedicine application and verified that I am speaking with the correct person using two identifiers.  Location: Patient: Jack Mejia Provider: Dr. Derek Jack   I discussed the limitations of evaluation and management by telemedicine and the availability of in person appointments. The patient expressed understanding and agreed to proceed.  INTERVAL HISTORY:  [name], a 75 y.o. male, returns for routine follow-up for his macrocytic anemia. Bandon was last seen on 11/06/20.  Today he reports feeling well. His breathing and energy levels have improved. He has been started back on Eliquis, but he has not taken any yet pending his GI appointment in the next few weeks. He denies bloody/black stools as well as any fevers or infections, but reports occasional light-headedness. He reports 5 minute long episodes of tingling in his legs. He reports frequent urination which has caused sleep disturbance.   REVIEW OF SYSTEMS:  Review of Systems  Respiratory:  Negative for shortness of breath.   Gastrointestinal:  Negative for blood in stool.  Genitourinary:  Positive for frequency.   Neurological:  Positive for headaches (occasional) and numbness (tingling legs and fingers; stable).  Psychiatric/Behavioral:  Positive for sleep disturbance.    PAST MEDICAL/SURGICAL HISTORY:  Past Medical History:  Diagnosis Date   Anxiety    Arthritis    Asthma    Atrial fibrillation ([place])    a. s/p DCCV in 03/2019   CAD (coronary artery  disease) 06/29/2019   Cramps of left lower extremity    Depression    Diabetes mellitus    type 2 for 7-8 yrs   Dyspnea    Elevated liver enzymes    Fatty liver    Gout    History of kidney stones    Hyperlipidemia    Hypertension    Renal insufficiency    Vertigo    Past Surgical History:  Procedure Laterality Date   BIOPSY  10/11/2020   Procedure: BIOPSY;  Surgeon: Harvel Quale, MD;  [facility];  Service: Gastroenterology;;   CARDIOVERSION N/A 04/21/2019   Procedure: CARDIOVERSION;  Surgeon: Sanda Klein, MD;  Location: [facility];  Service: Cardiovascular;  Laterality: N/A;   CARPAL TUNNEL RELEASE     both wrist   cataract surgery     bilateral   CHOLECYSTECTOMY     COLONOSCOPY  2011   COLONOSCOPY WITH PROPOFOL N/A 10/11/2020   Procedure: COLONOSCOPY WITH PROPOFOL;  Surgeon: Harvel Quale, MD;  [facility];  Service: Gastroenterology;  Laterality: N/A;  AM   ESOPHAGOGASTRODUODENOSCOPY (EGD) WITH PROPOFOL N/A 10/11/2020   Procedure: ESOPHAGOGASTRODUODENOSCOPY (EGD) WITH PROPOFOL;  Surgeon: Harvel Quale, MD;  [facility];  Service: Gastroenterology;  Laterality: N/A;   EYE SURGERY     HEMORROIDECTOMY     KNEE SURGERY Left    LEFT HEART CATH AND CORONARY ANGIOGRAPHY N/A 05/23/2019   Procedure: LEFT HEART CATH AND CORONARY ANGIOGRAPHY;  Surgeon: Belva Crome, MD;  Location: [facility];  Service: Cardiovascular;  Laterality:  N/A;   LUMBAR LAMINECTOMY/DECOMPRESSION MICRODISCECTOMY N/A 08/06/2016   Procedure: LUMBAR THREE- LUMBAR FIVE  DECOMPRESSIVE LUMBAR LAMINECTOMY;  Surgeon: Jovita Gamma, MD;  Location: [facility];  Service: Neurosurgery;  Laterality: N/A;   POLYPECTOMY  10/11/2020   Procedure: POLYPECTOMY;  Surgeon: Montez Morita, Quillian Quince, MD;  [facility];  Service: Gastroenterology;;    SOCIAL HISTORY:  Social History   Socioeconomic History   Marital status: Divorced     Spouse name: Not on file   Number of children: 2   Years of education: Not on file   Highest education level: Not on file  Occupational History   Occupation: RETIRED  Tobacco Use   Smoking status: Former    Packs/day: 4.00    Years: 20.00    Pack years: 80.00    Types: Cigarettes    Quit date: 10/14/1982    Years since quitting: 38.3   Smokeless tobacco: Never   Tobacco comments:    29 yrs ago  Vaping Use   Vaping Use: Never used  Substance and Sexual Activity   Alcohol use: No   Drug use: No   Sexual activity: Not Currently    Birth control/protection: None  Other Topics Concern   Not on file  Social History Narrative   Not on file   Social Determinants of Health   Financial Resource Strain: Medium Risk   Difficulty of Paying Living Expenses: Somewhat hard  Food Insecurity: No Food Insecurity   Worried About Charity fundraiser in the Last Year: Never true   Ran Out of Food in the Last Year: Never true  Transportation Needs: No Transportation Needs   Lack of Transportation (Medical): No   Lack of Transportation (Non-Medical): No  Physical Activity: Insufficiently Active   Days of Exercise per Week: 4 days   Minutes of Exercise per Session: 10 min  Stress: No Stress Concern Present   Feeling of Stress : Not at all  Social Connections: Moderately Isolated   Frequency of Communication with Friends and Family: More than three times a week   Frequency of Social Gatherings with Friends and Family: More than three times a week   Attends Religious Services: More than 4 times per year   Active Member of [employer] or Organizations: No   Attends Music therapist: Never   Marital Status: Separated  Human resources officer Violence: Not At Risk   Fear of Current or Ex-Partner: No   Emotionally Abused: No   Physically Abused: No   Sexually Abused: No    FAMILY HISTORY:  Family History  Problem Relation Age of Onset   Diabetes Mother    Cancer Mother        unknown  kind   Diabetes Sister    COPD Sister    Liver disease Brother    COPD Brother    Diabetes Maternal Grandmother    Diabetes Brother    COPD Sister    Healthy Daughter    Healthy Daughter    Colon cancer Neg Hx     CURRENT MEDICATIONS:  Current Outpatient Medications  Medication Sig Dispense Refill   acetaminophen (TYLENOL) 650 MG CR tablet Take 650 mg by mouth every 8 (eight) hours as needed for pain.     allopurinol (ZYLOPRIM) 300 MG tablet Take 1 tablet (300 mg total) by mouth daily. Start this after gout flare. 90 tablet 1   apixaban (ELIQUIS) 5 MG TABS tablet Take 1 tablet (5 mg total) by mouth 2 (two)  times daily. 60 tablet 6   atorvastatin (LIPITOR) 20 MG tablet TAKE 1 TABLET BY MOUTH EVERY DAY 90 tablet 3   Balsam [place]-Castor Oil (VENELEX) OINT Apply 1 application topically 3 (three) times daily as needed (irritation). Apply to sacrum and bilateral buttocks     BD PEN NEEDLE NANO 2ND GEN 32G X 4 MM MISC Use with insulin pen qid. 120 each 5   clotrimazole-betamethasone (LOTRISONE) cream Apply 1 application topically 2 (two) times daily. (Patient taking differently: Apply 1 application topically 2 (two) times daily as needed (irritation).) 30 g 3   diltiazem (CARDIZEM CD) 360 MG 24 hr capsule TAKE 1 CAPSULE BY MOUTH EVERY DAY 90 capsule 3   JARDIANCE 10 MG TABS tablet TAKE 1 TABLET (10 MG TOTAL) BY MOUTH DAILY BEFORE BREAKFAST. (Patient taking differently: Take 10 mg by mouth daily.) 30 tablet 5   LEVEMIR FLEXTOUCH 100 UNIT/ML FlexPen Inject 22 Units into the skin at bedtime. 15 mL 1   metoprolol tartrate (LOPRESSOR) 25 MG tablet TAKE 1 TABLET BY MOUTH TWICE A DAY (Patient taking differently: Take 25 mg by mouth 2 (two) times daily.) 180 tablet 1   NOVOLOG FLEXPEN 100 UNIT/ML FlexPen INJECT 15 UNITS INTO THE SKIN 3 (THREE) TIMES DAILY WITH MEALS 15 mL 0   ONETOUCH ULTRA test strip 1 each 2 (two) times daily.     potassium chloride (KLOR-CON) 10 MEQ tablet TAKE 1 TABLET BY MOUTH  EVERY DAY 90 tablet 1   sertraline (ZOLOFT) 100 MG tablet Take 1 tab p.o. qd (Patient taking differently: Take 100 mg by mouth daily. Take 1 tab p.o. qd) 90 tablet 1   torsemide (DEMADEX) 20 MG tablet Take 1 tablet (20 mg total) by mouth 2 (two) times daily. 60 tablet 6   No current facility-administered medications for this visit.    ALLERGIES:  Allergies  Allergen Reactions   Demerol Nausea And Vomiting   Vasotec [Enalapril] Cough   Lasix [Furosemide] Rash    Performance status (ECOG): 1 - Symptomatic but completely ambulatory  There were no vitals filed for this visit. Wt Readings from Last 3 Encounters:  12/26/20 188 lb 12.8 oz (85.6 kg)  12/17/20 187 lb 8 oz (85 kg)  11/21/20 188 lb (85.3 kg)    LABORATORY DATA:  I have reviewed the labs as listed.  CBC Latest Ref Rng & Units 01/03/2021 12/06/2020 11/06/2020  WBC 4.0 - 10.5 K/uL 4.4 5.1 4.9  Hemoglobin 13.0 - 17.0 g/dL 11.3(L) 11.5(L) 11.9(L)  Hematocrit 39.0 - 52.0 % 34.2(L) 35.3(L) 36.4(L)  Platelets 150 - 400 K/uL 141(L) 219 129(L)   CMP Latest Ref Rng & Units 11/06/2020 10/03/2020 08/22/2020  Glucose 70 - 99 mg/dL 152(H) 162(H) 330(H)  BUN 8 - 23 mg/dL 33(H) 24(H) 26(H)  Creatinine 0.61 - 1.24 mg/dL 1.98(H) 1.73(H) 1.71(H)  Sodium 135 - 145 mmol/L 139 137 133(L)  Potassium 3.5 - 5.1 mmol/L 3.8 3.3(L) 4.0  Chloride 98 - 111 mmol/L 100 98 98  CO2 22 - 32 mmol/L $RemoveB'26 26 26  'TqHMfMhP$ Calcium 8.9 - 10.3 mg/dL 9.3 8.8(L) 8.7(L)  Total Protein 6.5 - 8.1 g/dL 7.6 7.5 6.9  Total Bilirubin 0.3 - 1.2 mg/dL 1.1 0.9 1.0  Alkaline Phos 38 - 126 U/L 99 97 104  AST 15 - 41 U/L $Remo'23 23 22  'ZaAFE$ ALT 0 - 44 U/L $Remo'21 21 18      'wngai$ Component Value Date/Time   RBC 3.01 (L) 01/03/2021 0907   MCV 113.6 (H) 01/03/2021 9292  MCV 101 (H) 09/07/2018 1638   MCH 37.5 (H) 01/03/2021 0907   MCHC 33.0 01/03/2021 0907   RDW 15.8 (H) 01/03/2021 0907   RDW 12.8 09/07/2018 1638   LYMPHSABS 1.4 10/03/2020 1307   LYMPHSABS 1.3 09/07/2018 1638   MONOABS 1.5 (H)  10/03/2020 1307   EOSABS 0.1 10/03/2020 1307   EOSABS 0.2 09/07/2018 1638   BASOSABS 0.0 10/03/2020 1307   BASOSABS 0.1 09/07/2018 1638    DIAGNOSTIC IMAGING:  I have independently reviewed the scans and discussed with the patient. No results found.   ASSESSMENT:  1.  Macrocytic anemia: -Patient seen at the request of Dr. Wolfgang Phoenix for macrocytic anemia. -CTAP on 04/15/2017 showed no splenomegaly. -Patient has macrocytosis noted since February 2020, gradually worsening. -Colonoscopy on 06/05/2010 shows small polyp in the sigmoid colon colon, right colon, hepatic flexure and transverse colon. -He continues to have occasional bleeding on the tissue paper since Eliquis was started. -Serum erythropoietin was 77.2. -SPEP was negative.  Other nutritional deficiencies were ruled out. -Bone marrow biopsy on 03/29/2020 shows hypercellular marrow with trilineage dysplasia.  Blasts are 2%.  Chromosome analysis and FISH panel were normal. -5 doses of Venofer completed on 01/24/2020. -Retacrit 30,000 units weekly started on 04/08/2020.   2.  CKD: -History of CKD since 2017. -Renal ultrasound on 04/21/2019 shows bilateral renal cysts.   3.  Health maintenance: -Colonoscopy on 10/11/2020 with tubular adenoma in the transverse colon hyperplastic polyp in the rectum. -EGD on 10/11/2020 with normal esophagus, stomach and duodenum.   PLAN:  1.  Low risk MDS: - At last visit we have changed his Retacrit every 4 weeks. - In the last 3 months, his hemoglobin has been staying between 11 and 12.  Hemoglobin today is 12.1. - His last Retacrit dose was on 09/05/2020 at 30,000 units. - As he did not require any dose since last 5 months, I have recommended holding off checking his labs every month. - I have reviewed his iron panel which has decreased to 210 and has been downtrending.  Hence I have recommended him to start on iron tablet every other day with stool softener.  His last Venofer was on 01/14/2020. - It is  likely that he had blood loss while he was on Eliquis.  Once he starts back on it, we will do more close follow-ups. - We will see him back in 3 months with repeat ferritin, iron panel and CBC.   2.  CKD: - Creatinine today is 1.87 with baseline.   3.  Atrial fibrillation: - Eliquis has been on hold since recent hospitalization. - He will be seeing GI in 2 weeks.  At this time, its not clear when he will start back on it.   4.  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  Amiodarone was discontinued. - He reports improvement in his breathing.  Orders placed this encounter:  No orders of the defined types were placed in this encounter.  I provided 30 minutes of non-face-to-face time during this encounter.  Derek Jack, MD [place] [phone]   I, Thana Ates, am acting as a scribe for Dr. Derek Jack.  I, Derek Jack MD, have reviewed the above documentation for accuracy and completeness, and I agree with the above.

## 2021-02-03 ENCOUNTER — Other Ambulatory Visit: Payer: Self-pay

## 2021-02-03 ENCOUNTER — Inpatient Hospital Stay (HOSPITAL_COMMUNITY): Payer: Medicare HMO

## 2021-02-03 ENCOUNTER — Inpatient Hospital Stay (HOSPITAL_BASED_OUTPATIENT_CLINIC_OR_DEPARTMENT_OTHER): Payer: Medicare HMO | Admitting: Hematology

## 2021-02-03 ENCOUNTER — Inpatient Hospital Stay (HOSPITAL_COMMUNITY): Payer: Medicare HMO | Attending: Hematology

## 2021-02-03 VITALS — BP 131/65 | HR 64 | Temp 96.7°F | Resp 18 | Wt 191.6 lb

## 2021-02-03 DIAGNOSIS — I129 Hypertensive chronic kidney disease with stage 1 through stage 4 chronic kidney disease, or unspecified chronic kidney disease: Secondary | ICD-10-CM | POA: Diagnosis not present

## 2021-02-03 DIAGNOSIS — R519 Headache, unspecified: Secondary | ICD-10-CM | POA: Diagnosis not present

## 2021-02-03 DIAGNOSIS — D508 Other iron deficiency anemias: Secondary | ICD-10-CM | POA: Diagnosis not present

## 2021-02-03 DIAGNOSIS — E1122 Type 2 diabetes mellitus with diabetic chronic kidney disease: Secondary | ICD-10-CM | POA: Diagnosis not present

## 2021-02-03 DIAGNOSIS — N189 Chronic kidney disease, unspecified: Secondary | ICD-10-CM | POA: Diagnosis not present

## 2021-02-03 DIAGNOSIS — R0609 Other forms of dyspnea: Secondary | ICD-10-CM | POA: Insufficient documentation

## 2021-02-03 DIAGNOSIS — D4620 Refractory anemia with excess of blasts, unspecified: Secondary | ICD-10-CM | POA: Diagnosis not present

## 2021-02-03 DIAGNOSIS — D649 Anemia, unspecified: Secondary | ICD-10-CM | POA: Diagnosis not present

## 2021-02-03 DIAGNOSIS — G479 Sleep disorder, unspecified: Secondary | ICD-10-CM | POA: Diagnosis not present

## 2021-02-03 DIAGNOSIS — D539 Nutritional anemia, unspecified: Secondary | ICD-10-CM

## 2021-02-03 DIAGNOSIS — R200 Anesthesia of skin: Secondary | ICD-10-CM | POA: Diagnosis not present

## 2021-02-03 DIAGNOSIS — Z87891 Personal history of nicotine dependence: Secondary | ICD-10-CM | POA: Insufficient documentation

## 2021-02-03 DIAGNOSIS — R350 Frequency of micturition: Secondary | ICD-10-CM | POA: Diagnosis not present

## 2021-02-03 LAB — CBC WITH DIFFERENTIAL/PLATELET
Abs Immature Granulocytes: 0.18 10*3/uL — ABNORMAL HIGH (ref 0.00–0.07)
Basophils Absolute: 0 10*3/uL (ref 0.0–0.1)
Basophils Relative: 0 %
Eosinophils Absolute: 0 10*3/uL (ref 0.0–0.5)
Eosinophils Relative: 0 %
HCT: 36.3 % — ABNORMAL LOW (ref 39.0–52.0)
Hemoglobin: 12.1 g/dL — ABNORMAL LOW (ref 13.0–17.0)
Immature Granulocytes: 4 %
Lymphocytes Relative: 26 %
Lymphs Abs: 1.2 10*3/uL (ref 0.7–4.0)
MCH: 37.5 pg — ABNORMAL HIGH (ref 26.0–34.0)
MCHC: 33.3 g/dL (ref 30.0–36.0)
MCV: 112.4 fL — ABNORMAL HIGH (ref 80.0–100.0)
Monocytes Absolute: 1.2 10*3/uL — ABNORMAL HIGH (ref 0.1–1.0)
Monocytes Relative: 28 %
Neutro Abs: 1.9 10*3/uL (ref 1.7–7.7)
Neutrophils Relative %: 42 %
Platelets: 121 10*3/uL — ABNORMAL LOW (ref 150–400)
RBC: 3.23 MIL/uL — ABNORMAL LOW (ref 4.22–5.81)
RDW: 15.1 % (ref 11.5–15.5)
WBC: 4.5 10*3/uL (ref 4.0–10.5)
nRBC: 0 % (ref 0.0–0.2)

## 2021-02-03 LAB — COMPREHENSIVE METABOLIC PANEL
ALT: 20 U/L (ref 0–44)
AST: 21 U/L (ref 15–41)
Albumin: 4.3 g/dL (ref 3.5–5.0)
Alkaline Phosphatase: 128 U/L — ABNORMAL HIGH (ref 38–126)
Anion gap: 11 (ref 5–15)
BUN: 32 mg/dL — ABNORMAL HIGH (ref 8–23)
CO2: 29 mmol/L (ref 22–32)
Calcium: 9.3 mg/dL (ref 8.9–10.3)
Chloride: 98 mmol/L (ref 98–111)
Creatinine, Ser: 1.85 mg/dL — ABNORMAL HIGH (ref 0.61–1.24)
GFR, Estimated: 38 mL/min — ABNORMAL LOW (ref >60)
Glucose, Bld: 194 mg/dL — ABNORMAL HIGH (ref 70–99)
Potassium: 4 mmol/L (ref 3.5–5.1)
Sodium: 138 mmol/L (ref 135–145)
Total Bilirubin: 1 mg/dL (ref 0.3–1.2)
Total Protein: 7.3 g/dL (ref 6.5–8.1)

## 2021-02-03 LAB — FOLATE: Folate: 29.5 ng/mL (ref >5.9)

## 2021-02-03 LAB — IRON AND TIBC
Iron: 66 ug/dL (ref 45–182)
Saturation Ratios: 20 % (ref 17.9–39.5)
TIBC: 322 ug/dL (ref 250–450)
UIBC: 256 ug/dL

## 2021-02-03 LAB — FERRITIN: Ferritin: 218 ng/mL (ref 24–336)

## 2021-02-03 LAB — VITAMIN B12: Vitamin B-12: 460 pg/mL (ref 180–914)

## 2021-02-03 NOTE — Progress Notes (Signed)
Jack Mejia presents today for Retacrit injection per the provider's orders.  Hgb noted to be 12.1.  Retacrit not given per order parameters.  No questions or complaints noted at this time. Discharge from clinic via wheelchair in stable condition.  Alert and oriented X 3.  Follow up with [facility] as scheduled.

## 2021-02-03 NOTE — Patient Instructions (Addendum)
[place] at [facility] Discharge Instructions  You were seen today by Dr. Delton Coombes. He went over your recent results. Purchase iron tablets (325 mg) over the counter and take one every other day. Dr. Delton Coombes will see you back in 3 months for labs and follow up.   Thank you for choosing [place] at [facility] to provide your oncology and hematology care.  To afford each patient quality time with our provider, please arrive at least 15 minutes before your scheduled appointment time.   If you have a lab appointment with the [facility] please come in thru the Main Entrance and check in at the main information desk  You need to re-schedule your appointment should you arrive 10 or more minutes late.  We strive to give you quality time with our providers, and arriving late affects you and other patients whose appointments are after yours.  Also, if you no show three or more times for appointments you may be dismissed from the clinic at the providers discretion.     Again, thank you for choosing [facility].  Our hope is that these requests will decrease the amount of time that you wait before being seen by our physicians.       _____________________________________________________________  Should you have questions after your visit to [facility], please contact our office at [phone] between the hours of 8:00 a.m. and 4:30 p.m.  Voicemails left after 4:00 p.m. will not be returned until the following business day.  For prescription refill requests, have your pharmacy contact our office and allow 72 hours.    Cancer Center Support Programs:   > Cancer Support Group  2nd Tuesday of the month 1pm-2pm, Journey Room

## 2021-02-03 NOTE — Progress Notes (Signed)
Patient has been assessed, vital signs and labs have been reviewed by Dr. Delton Coombes. ANC, Creatinine, LFTs, and Platelets are within treatment parameters per Dr. Delton Coombes. The patient does not need treatment at this time.  Hgb is 12.1. Primary RN and pharmacy aware.

## 2021-02-05 ENCOUNTER — Other Ambulatory Visit: Payer: Self-pay | Admitting: Adult Health

## 2021-02-09 ENCOUNTER — Other Ambulatory Visit: Payer: Self-pay | Admitting: Adult Health

## 2021-02-10 ENCOUNTER — Other Ambulatory Visit: Payer: Self-pay | Admitting: Family Medicine

## 2021-02-10 ENCOUNTER — Telehealth: Payer: Self-pay | Admitting: Family Medicine

## 2021-02-10 MED ORDER — LEVEMIR FLEXTOUCH 100 UNIT/ML ~~LOC~~ SOPN: 22.0000 [IU] | PEN_INJECTOR | Freq: Every day | SUBCUTANEOUS | 0 refills | Status: DC

## 2021-02-10 NOTE — Telephone Encounter (Signed)
Prescription sent electronically to pharmacy. Wife Ann(DPR) notified and verbalized understanding.

## 2021-02-10 NOTE — Telephone Encounter (Signed)
Patient needing refill on levermir flex touch 100 unit/ml called into [facility]- 

## 2021-02-13 ENCOUNTER — Other Ambulatory Visit: Payer: Self-pay

## 2021-02-13 ENCOUNTER — Ambulatory Visit (INDEPENDENT_AMBULATORY_CARE_PROVIDER_SITE_OTHER): Payer: Medicare HMO | Admitting: Gastroenterology

## 2021-02-13 ENCOUNTER — Encounter (INDEPENDENT_AMBULATORY_CARE_PROVIDER_SITE_OTHER): Payer: Self-pay | Admitting: Gastroenterology

## 2021-02-13 VITALS — BP 130/67 | HR 67 | Temp 99.7°F | Ht 70.0 in | Wt 189.9 lb

## 2021-02-13 DIAGNOSIS — K921 Melena: Secondary | ICD-10-CM | POA: Diagnosis not present

## 2021-02-13 NOTE — Progress Notes (Signed)
Jack Mejia, M.D. Gastroenterology & Hepatology [facility] [address] [place],  [address]  Primary Care Physician: Erven Colla, DO [place] [id number]  I will communicate my assessment and recommendations to the referring MD via EMR.  Problems: Rectal bleeding, likely related to inflammatory polyp in rectum  History of Present Illness: Jack Mejia is a 75 y.o. male with past medical history of atrial fibrillation on Eliquis, hypertension, diastolic heart failure, CKD, hyperlipidemia, diabetes and history of macrocytic anemia , who presents for follow up of hematochezia.  The patient was last seen on 09/26/2020. At that time, the patient was scheduled for an EGD and a colonoscopy.He underwent this procedures on 10/11/2020.  Esophagogastroduodenospy showed a normal esophagus, stomach and duodenum (biopsies were negative for celiac disease).  Colonoscopy showed presence of a 10 mm polyp in transverse colon which was resected with a cold snare and 2 clips were placed, 2 small polyps in the transverse colon less than 5 mm were removed with a cold snare, a 15 mm polyp was found above the dentate line was removed with a hot snare (this had inflammatory changes).  Patient was advised to have a repeat colonoscopy in 1 year for surveillance given resection technique.  Pathology showed presence of 3 tubular adenomas and 1 hyperplastic polyp.  Patient states feeling well and denies having any complaints.  Denies any more hematochezia episodes. Has had intermittent mild episodes of pain in his mid abdomen for the last 70 years but not more frequently recently. Has not restarted Eliquis yet as we were waiting to evaluate his symptom improvement. The patient denies having any nausea, vomiting, fever, chills,  melena, hematemesis, abdominal distention, abdominal pain, diarrhea, jaundice, pruritus or weight loss.  Most recent  labs from 02/03/2021 showed a CBC with a hemoglobin of 12.1, MCV of 112, iron of 66, TIBC 322, ferritin 218 and saturation of 20%.  Last EGD:as above Last Colonoscopy:as above  Past Medical History: Past Medical History:  Diagnosis Date   Anxiety    Arthritis    Asthma    Atrial fibrillation ([place])    a. s/p DCCV in 03/2019   CAD (coronary artery disease) 06/29/2019   Cramps of left lower extremity    Depression    Diabetes mellitus    type 2 for 7-8 yrs   Dyspnea    Elevated liver enzymes    Fatty liver    Gout    History of kidney stones    Hyperlipidemia    Hypertension    Renal insufficiency    Vertigo     Past Surgical History: Past Surgical History:  Procedure Laterality Date   BIOPSY  10/11/2020   Procedure: BIOPSY;  Surgeon: Harvel Quale, MD;  [facility];  Service: Gastroenterology;;   CARDIOVERSION N/A 04/21/2019   Procedure: CARDIOVERSION;  Surgeon: Sanda Klein, MD;  Location: [facility];  Service: Cardiovascular;  Laterality: N/A;   CARPAL TUNNEL RELEASE     both wrist   cataract surgery     bilateral   CHOLECYSTECTOMY     COLONOSCOPY  2011   COLONOSCOPY WITH PROPOFOL N/A 10/11/2020   Procedure: COLONOSCOPY WITH PROPOFOL;  Surgeon: Harvel Quale, MD;  [facility];  Service: Gastroenterology;  Laterality: N/A;  AM   ESOPHAGOGASTRODUODENOSCOPY (EGD) WITH PROPOFOL N/A 10/11/2020   Procedure: ESOPHAGOGASTRODUODENOSCOPY (EGD) WITH PROPOFOL;  Surgeon: Harvel Quale, MD;  [facility];  Service:  Gastroenterology;  Laterality: N/A;   EYE SURGERY     HEMORROIDECTOMY     KNEE SURGERY Left    LEFT HEART CATH AND CORONARY ANGIOGRAPHY N/A 05/23/2019   Procedure: LEFT HEART CATH AND CORONARY ANGIOGRAPHY;  Surgeon: Belva Crome, MD;  Location: [facility];  Service: Cardiovascular;  Laterality: N/A;   LUMBAR LAMINECTOMY/DECOMPRESSION MICRODISCECTOMY N/A 08/06/2016   Procedure: LUMBAR  THREE- LUMBAR FIVE  DECOMPRESSIVE LUMBAR LAMINECTOMY;  Surgeon: Jovita Gamma, MD;  Location: [facility];  Service: Neurosurgery;  Laterality: N/A;   POLYPECTOMY  10/11/2020   Procedure: POLYPECTOMY;  Surgeon: Montez Morita, Quillian Quince, MD;  [facility];  Service: Gastroenterology;;    Family History: Family History  Problem Relation Age of Onset   Diabetes Mother    Cancer Mother        unknown kind   Diabetes Sister    COPD Sister    Liver disease Brother    COPD Brother    Diabetes Maternal Grandmother    Diabetes Brother    COPD Sister    Healthy Daughter    Healthy Daughter    Colon cancer Neg Hx     Social History: Social History   Tobacco Use  Smoking Status Former   Packs/day: 4.00   Years: 20.00   Pack years: 80.00   Types: Cigarettes   Quit date: 10/14/1982   Years since quitting: 38.3  Smokeless Tobacco Never  Tobacco Comments   29 yrs ago   Social History   Substance and Sexual Activity  Alcohol Use No   Social History   Substance and Sexual Activity  Drug Use No    Allergies: Allergies  Allergen Reactions   Demerol Nausea And Vomiting   Vasotec [Enalapril] Cough   Lasix [Furosemide] Rash    Medications: Current Outpatient Medications  Medication Sig Dispense Refill   acetaminophen (TYLENOL) 650 MG CR tablet Take 650 mg by mouth every 8 (eight) hours as needed for pain.     allopurinol (ZYLOPRIM) 300 MG tablet Take 1 tablet (300 mg total) by mouth daily. Start this after gout flare. 90 tablet 1   atorvastatin (LIPITOR) 20 MG tablet TAKE 1 TABLET BY MOUTH EVERY DAY 90 tablet 3   Balsam [place]-Castor Oil (VENELEX) OINT Apply 1 application topically 3 (three) times daily as needed (irritation). Apply to sacrum and bilateral buttocks     BD PEN NEEDLE NANO 2ND GEN 32G X 4 MM MISC Use with insulin pen qid. 120 each 5   clotrimazole-betamethasone (LOTRISONE) cream Apply 1 application topically 2 (two) times daily. (Patient taking  differently: Apply 1 application topically 2 (two) times daily as needed (irritation).) 30 g 3   colchicine 0.6 MG tablet Take by mouth.     diltiazem (CARDIZEM CD) 360 MG 24 hr capsule TAKE 1 CAPSULE BY MOUTH EVERY DAY 90 capsule 3   ferrous sulfate 325 (65 FE) MG tablet Take 325 mg by mouth. Takes one every other day     JARDIANCE 10 MG TABS tablet TAKE 1 TABLET (10 MG TOTAL) BY MOUTH DAILY BEFORE BREAKFAST. (Patient taking differently: Take 10 mg by mouth daily.) 30 tablet 5   LEVEMIR FLEXTOUCH 100 UNIT/ML FlexPen Inject 22 Units into the skin at bedtime. 15 mL 0   metoprolol tartrate (LOPRESSOR) 25 MG tablet TAKE 1 TABLET BY MOUTH TWICE A DAY (Patient taking differently: Take 25 mg by mouth 2 (two) times daily.) 180 tablet 1   NOVOLOG FLEXPEN 100 UNIT/ML FlexPen INJECT  15 UNITS INTO THE SKIN 3 (THREE) TIMES DAILY WITH MEALS 15 mL 0   ONETOUCH ULTRA test strip 1 each 2 (two) times daily.     potassium chloride (KLOR-CON) 10 MEQ tablet TAKE 1 TABLET BY MOUTH EVERY DAY 90 tablet 1   sertraline (ZOLOFT) 100 MG tablet Take 1 tab p.o. qd (Patient taking differently: Take 100 mg by mouth daily. Take 1 tab p.o. qd) 90 tablet 1   torsemide (DEMADEX) 20 MG tablet Take 1 tablet (20 mg total) by mouth 2 (two) times daily. 60 tablet 6   No current facility-administered medications for this visit.    Review of Systems: GENERAL: negative for malaise, night sweats HEENT: No changes in hearing or vision, no nose bleeds or other nasal problems. NECK: Negative for lumps, goiter, pain and significant neck swelling RESPIRATORY: Negative for cough, wheezing CARDIOVASCULAR: Negative for chest pain, leg swelling, palpitations, orthopnea GI: SEE HPI MUSCULOSKELETAL: Negative for joint pain or swelling, back pain, and muscle pain. SKIN: Negative for lesions, rash PSYCH: Negative for sleep disturbance, mood disorder and recent psychosocial stressors. HEMATOLOGY Negative for prolonged bleeding, bruising easily,  and swollen nodes. ENDOCRINE: Negative for cold or heat intolerance, polyuria, polydipsia and goiter. NEURO: negative for tremor, gait imbalance, syncope and seizures. The remainder of the review of systems is noncontributory.   Physical Exam: BP 130/67 (BP Location: Right Arm, Patient Position: Sitting, Cuff Size: Large)   Pulse 67   Temp 99.7 F (37.6 C) (Oral)   Ht 5\' 10"  (1.778 m)   Wt 189 lb 14.4 oz (86.1 kg)   BMI 27.25 kg/m  GENERAL: The patient is AO x3, in no acute distress. HEENT: Head is normocephalic and atraumatic. EOMI are intact. Mouth is well hydrated and without lesions. NECK: Supple. No masses LUNGS: Clear to auscultation. No presence of rhonchi/wheezing/rales. Adequate chest expansion HEART: RRR, normal s1 and s2. ABDOMEN: Soft, nontender, no guarding, no peritoneal signs, and nondistended. BS +. No masses. EXTREMITIES: Without any cyanosis, clubbing, rash, lesions or edema. NEUROLOGIC: AOx3, no focal motor deficit. SKIN: no jaundice, no rashes  Imaging/Labs: as above  I personally reviewed and interpreted the available labs, imaging and endoscopic files.  Impression and Plan: Jack Mejia is a 75 y.o. male with past medical history of atrial fibrillation on Eliquis, hypertension, diastolic heart failure, CKD, hyperlipidemia, diabetes and history of macrocytic anemia , who presents for follow up of hematochezia.  Patient had previous episode of hematochezia.  Underwent endoscopic investigations that did not show any upper source, but presence of an inflammatory polyp in his rectum that could explain the rectal bleeding he was presenting.  After he underwent resection of the polyp he has not presented any more bleeding clinically and his hemoglobin has remained stable.  It is very likely that this polyp was the culprit of his bleeding episodes.  I would recommend restarting his Eliquis and will need to evaluate if he has any more overt clinical episodes of  bleeding.  If so we will need to proceed with a capsule endoscopy.  I will communicate these recommendations to his cardiologist so he can be restarted on Eliquis. Patient understood and agreed.  - Ok to restart Eliquis from GI perspective - Follow up with cardiologist - Repeat colonoscopy in one year for follow up of colon polyps  All questions were answered.      Harvel Quale, MD Gastroenterology and Hepatology [facility]

## 2021-02-13 NOTE — Patient Instructions (Addendum)
Ok to restart Eliquis from GI perspective Follow up with cardiologist Repeat colonoscopy in one year

## 2021-02-17 ENCOUNTER — Telehealth: Payer: Self-pay | Admitting: *Deleted

## 2021-02-17 MED ORDER — APIXABAN 5 MG PO TABS: 5.0000 mg | ORAL_TABLET | Freq: Two times a day (BID) | ORAL | Status: DC

## 2021-02-17 NOTE — Telephone Encounter (Signed)
Left message to return call 

## 2021-02-17 NOTE — Telephone Encounter (Signed)
Jack Mejia notified.

## 2021-02-17 NOTE — Telephone Encounter (Signed)
-----   Message from Verta Ellen., NP sent at 02/13/2021  4:38 PM EDT ----- Regarding: FW: ok to restart Eliquis Gayle. Dr Catalina Lunger says OK to restart eliquis. Would you please call the patient and tell him he can restart. Thanks. ----- Message ----- From: Harvel Quale, MD Sent: 02/13/2021   3:38 PM EDT To: Verta Ellen., NP Subject: ok to restart Eliquis                          Hi Mitzi Hansen,  Hope you're good. Ok to restart Eliquis from GI perspective.  [name], MD Gastroenterology and Hepatology [facility]

## 2021-02-19 ENCOUNTER — Encounter: Payer: Self-pay | Admitting: Urology

## 2021-02-19 ENCOUNTER — Ambulatory Visit: Payer: Medicare HMO | Admitting: Urology

## 2021-02-19 ENCOUNTER — Other Ambulatory Visit: Payer: Self-pay

## 2021-02-19 VITALS — BP 110/56 | HR 70

## 2021-02-19 DIAGNOSIS — R310 Gross hematuria: Secondary | ICD-10-CM | POA: Diagnosis not present

## 2021-02-19 DIAGNOSIS — N200 Calculus of kidney: Secondary | ICD-10-CM

## 2021-02-19 LAB — MICROSCOPIC EXAMINATION
Bacteria, UA: NONE SEEN
Renal Epithel, UA: NONE SEEN /hpf
WBC, UA: NONE SEEN /hpf (ref 0–5)

## 2021-02-19 LAB — URINALYSIS, ROUTINE W REFLEX MICROSCOPIC
Bilirubin, UA: NEGATIVE
Ketones, UA: NEGATIVE
Leukocytes,UA: NEGATIVE
Nitrite, UA: NEGATIVE
Specific Gravity, UA: <1.005 — ABNORMAL LOW (ref 1.005–1.030)
Urobilinogen, Ur: 0.2 mg/dL (ref 0.2–1.0)
pH, UA: 5.5 (ref 5.0–7.5)

## 2021-02-19 MED ORDER — CIPROFLOXACIN HCL 500 MG PO TABS
500.0000 mg | ORAL_TABLET | Freq: Once | ORAL | Status: AC
  Administered 2021-02-19: 500 mg via ORAL

## 2021-02-19 NOTE — Progress Notes (Signed)
Urological Symptom Review  Patient is experiencing the following symptoms: Frequent urination Get up at night to urinate Stream starts and stops Kidney stones   Review of Systems  Gastrointestinal (upper)  : Negative for upper GI symptoms  Gastrointestinal (lower) : Negative for lower GI symptoms  Constitutional : Negative for symptoms  Skin: Skin rash/lesion Itching  Eyes: Negative for eye symptoms  Ear/Nose/Throat : Negative for Ear/Nose/Throat symptoms  Hematologic/Lymphatic: Negative for Hematologic/Lymphatic symptoms  Cardiovascular : Negative for cardiovascular symptoms  Respiratory : Shortness of breath  Endocrine: Negative for endocrine symptoms  Musculoskeletal: Joint pain  Neurological: Negative for neurological symptoms  Psychologic: Negative for psychiatric symptoms

## 2021-02-19 NOTE — Progress Notes (Signed)
   02/19/21  CC: followup bladder lesions   HPI: Jack Mejia is a 75yo here for followup for a left posterior wall bladder lesion Blood pressure (!) 110/56, pulse 70. NED. A&Ox3.   No respiratory distress   Abd soft, NT, ND Normal phallus with bilateral descended testicles  Cystoscopy Procedure Note  Patient identification was confirmed, informed consent was obtained, and patient was prepped using Betadine solution.  Lidocaine jelly was administered per urethral meatus.     Pre-Procedure: - Inspection reveals a normal caliber ureteral meatus.  Procedure: The flexible cystoscope was introduced without difficulty - No urethral strictures/lesions are present. - Enlarged prostate  - Normal bladder neck - Bilateral ureteral orifices identified - Stable 8mm left posterior wall bladder lesion - No bladder stones - No trabeculation    Post-Procedure: - Patient tolerated the procedure well  Assessment/ Plan: RTC 6 months with KUB and for cystoscopy  No follow-ups on file.  Nicolette Bang, MD

## 2021-02-19 NOTE — Progress Notes (Signed)

## 2021-02-19 NOTE — Patient Instructions (Signed)
Textbook of Natural Medicine (5th ed., pp. 1518-1527.e3). [place], [place]: Elsevier.">  Dietary Guidelines to Help Prevent Kidney Stones Kidney stones are deposits of minerals and salts that form inside your kidneys. Your risk of developing kidney stones may be greater depending on your diet, your lifestyle, the medicines you take, and whether you have certain medical conditions. Most people can lower their chances of developing kidney stones by following the instructions below. Your dietitian may give you more specific instructions depending on your overall health and the type of kidney stones youtend to develop. What are tips for following this plan? Reading food labels  Choose foods with "no salt added" or "low-salt" labels. Limit your salt (sodium) intake to less than 1,500 mg a day. Choose foods with calcium for each meal and snack. Try to eat about 300 mg of calcium at each meal. Foods that contain 200-500 mg of calcium a serving include: 8 oz (237 mL) of milk, calcium-fortifiednon-dairy milk, and calcium-fortifiedfruit juice. Calcium-fortified means that calcium has been added to these drinks. 8 oz (237 mL) of kefir, yogurt, and soy yogurt. 4 oz (114 g) of tofu. 1 oz (28 g) of cheese. 1 cup (150 g) of dried figs. 1 cup (91 g) of cooked broccoli. One 3 oz (85 g) can of sardines or mackerel. Most people need 1,000-1,500 mg of calcium a day. Talk to your dietitian abouthow much calcium is recommended for you. Shopping Buy plenty of fresh fruits and vegetables. Most people do not need to avoid fruits and vegetables, even if these foods contain nutrients that may contribute to kidney stones. When shopping for convenience foods, choose: Whole pieces of fruit. Pre-made salads with dressing on the side. Low-fat fruit and yogurt smoothies. Avoid buying frozen meals or prepared deli foods. These can be high in sodium. Look for foods with live cultures, such as yogurt and kefir. Choose high-fiber  grains, such as whole-wheat breads, oat bran, and wheat cereals. Cooking Do not add salt to food when cooking. Place a salt shaker on the table and allow each person to add his or her own salt to taste. Use vegetable protein, such as beans, textured vegetable protein (TVP), or tofu, instead of meat in pasta, casseroles, and soups. Meal planning Eat less salt, if told by your dietitian. To do this: Avoid eating processed or pre-made food. Avoid eating fast food. Eat less animal protein, including cheese, meat, poultry, or fish, if told by your dietitian. To do this: Limit the number of times you have meat, poultry, fish, or cheese each week. Eat a diet free of meat at least 2 days a week. Eat only one serving each day of meat, poultry, fish, or seafood. When you prepare animal protein, cut pieces into small portion sizes. For most meat and fish, one serving is about the size of the palm of your hand. Eat at least five servings of fresh fruits and vegetables each day. To do this: Keep fruits and vegetables on hand for snacks. Eat one piece of fruit or a handful of berries with breakfast. Have a salad and fruit at lunch. Have two kinds of vegetables at dinner. Limit foods that are high in a substance called oxalate. These include: Spinach (cooked), rhubarb, beets, sweet potatoes, and Swiss chard. Peanuts. Potato chips, french fries, and baked potatoes with skin on. Nuts and nut products. Chocolate. If you regularly take a diuretic medicine, make sure to eat at least 1 or 2 servings of fruits or vegetables that are   high in potassium each day. These include: Avocado. Banana. Orange, prune, carrot, or tomato juice. Baked potato. Cabbage. Beans and split peas. Lifestyle  Drink enough fluid to keep your urine pale yellow. This is the most important thing you can do. Spread your fluid intake throughout the day. If you drink alcohol: Limit how much you use to: 0-1 drink a day for women who  are not pregnant. 0-2 drinks a day for men. Be aware of how much alcohol is in your drink. In the [place] one drink equals one 12 oz bottle of beer (355 mL), one 5 oz glass of wine (148 mL), or one 1 oz glass of hard liquor (44 mL). Lose weight if told by your health care provider. Work with your dietitian to find an eating plan and weight loss strategies that work best for you.  General information Talk to your health care provider and dietitian about taking daily supplements. You may be told the following depending on your health and the cause of your kidney stones: Not to take supplements with vitamin C. To take a calcium supplement. To take a daily probiotic supplement. To take other supplements such as magnesium, fish oil, or vitamin B6. Take over-the-counter and prescription medicines only as told by your health care provider. These include supplements. What foods should I limit? Limit your intake of the following foods, or eat them as told by your dietitian. Vegetables Spinach. Rhubarb. Beets. Canned vegetables. Pickles. Olives. Baked potatoeswith skin. Grains Wheat bran. Baked goods. Salted crackers. Cereals high in sugar. Meats and other proteins Nuts. Nut butters. Large portions of meat, poultry, or fish. Salted, precooked,or cured meats, such as sausages, meat loaves, and hot dogs. Dairy Cheese. Beverages Regular soft drinks. Regular vegetable juice. Seasonings and condiments Seasoning blends with salt. Salad dressings. Soy sauce. Ketchup. Barbecue sauce. Other foods Canned soups. Canned pasta sauce. Casseroles. Pizza. Lasagna. Frozen meals.Potato chips. French fries. The items listed above may not be a complete list of foods and beverages you should limit. Contact a dietitian for more information. What foods should I avoid? Talk to your dietitian about specific foods you should avoid based on the typeof kidney stones you have and your overall health. Fruits Grapefruit. The  item listed above may not be a complete list of foods and beverages you should avoid. Contact a dietitian for more information. Summary Kidney stones are deposits of minerals and salts that form inside your kidneys. You can lower your risk of kidney stones by making changes to your diet. The most important thing you can do is drink enough fluid. Drink enough fluid to keep your urine pale yellow. Talk to your dietitian about how much calcium you should have each day, and eat less salt and animal protein as told by your dietitian. This information is not intended to replace advice given to you by your health care provider. Make sure you discuss any questions you have with your healthcare provider. Document Revised: 07/06/2019 Document Reviewed: 07/06/2019 Elsevier Patient Education  2022 [employer]  

## 2021-02-25 ENCOUNTER — Ambulatory Visit: Payer: Medicare HMO | Admitting: Family Medicine

## 2021-02-27 ENCOUNTER — Other Ambulatory Visit: Payer: Self-pay

## 2021-02-27 ENCOUNTER — Ambulatory Visit (INDEPENDENT_AMBULATORY_CARE_PROVIDER_SITE_OTHER): Payer: Medicare HMO | Admitting: Family Medicine

## 2021-02-27 VITALS — BP 107/58 | HR 76 | Temp 97.2°F | Ht 70.0 in | Wt 185.8 lb

## 2021-02-27 DIAGNOSIS — M1A0620 Idiopathic chronic gout, left knee, without tophus (tophi): Secondary | ICD-10-CM

## 2021-02-27 DIAGNOSIS — I10 Essential (primary) hypertension: Secondary | ICD-10-CM

## 2021-02-27 DIAGNOSIS — I129 Hypertensive chronic kidney disease with stage 1 through stage 4 chronic kidney disease, or unspecified chronic kidney disease: Secondary | ICD-10-CM | POA: Diagnosis not present

## 2021-02-27 DIAGNOSIS — R69 Illness, unspecified: Secondary | ICD-10-CM | POA: Diagnosis not present

## 2021-02-27 DIAGNOSIS — E1122 Type 2 diabetes mellitus with diabetic chronic kidney disease: Secondary | ICD-10-CM

## 2021-02-27 DIAGNOSIS — N1832 Chronic kidney disease, stage 3b: Secondary | ICD-10-CM

## 2021-02-27 DIAGNOSIS — F320 Major depressive disorder, single episode, mild: Secondary | ICD-10-CM

## 2021-02-27 MED ORDER — EMPAGLIFLOZIN 10 MG PO TABS: 10.0000 mg | ORAL_TABLET | Freq: Every day | ORAL | 5 refills | Status: DC

## 2021-02-27 MED ORDER — COLCHICINE 0.6 MG PO TABS: ORAL_TABLET | ORAL | 0 refills | Status: DC

## 2021-02-27 MED ORDER — TRAMADOL HCL 50 MG PO TABS: ORAL_TABLET | ORAL | 1 refills | Status: AC

## 2021-02-27 MED ORDER — LEVEMIR FLEXTOUCH 100 UNIT/ML ~~LOC~~ SOPN: 24.0000 [IU] | PEN_INJECTOR | Freq: Every day | SUBCUTANEOUS | 1 refills | Status: DC

## 2021-02-27 MED ORDER — SERTRALINE HCL 100 MG PO TABS: ORAL_TABLET | ORAL | 1 refills | Status: DC

## 2021-02-27 MED ORDER — METOPROLOL TARTRATE 25 MG PO TABS: 25.0000 mg | ORAL_TABLET | Freq: Two times a day (BID) | ORAL | 1 refills | Status: DC

## 2021-02-27 NOTE — Progress Notes (Signed)
Patient ID: Jack Mejia, male    DOB: [birth year], 75 y.o.   MRN: [id number]   Chief Complaint  Patient presents with   Diabetes    Follow up   Subjective:    HPI Pt seen for f/u dm2, ckd, gout, and htn-  H/o CKD stage 3- -Cr 1.85  H/o chronic gout left knee- And has gout and left knee is swollen and hard to walk. Mild erythema and ankle swollen. About 1 wk ago Taking allopurinol and stopped it and now on colchicine 0.6mg  about 1 wk ago.  Went to the [employer] administration. Was feeling okay then it started back again with left knee and ankle after they examined his leg and ankle.  Pt taking allopurinol for gout normally.  Dm2-  Glucose today fasting- 170s. Some times seeing 130-140s. Needs eye exam. No new foot concerns, cuts or sores.  HTN Pt compliant with BP meds.  No SEs Denies chest pain, sob, LE swelling, or blurry vision.  H/o afib- stable.    Medical History Jack Mejia has a past medical history of Anxiety, Arthritis, Asthma, Atrial fibrillation ([place]), CAD (coronary artery disease) (06/29/2019), Cramps of left lower extremity, Depression, Diabetes mellitus, Dyspnea, Elevated liver enzymes, Fatty liver, Gout, History of kidney stones, Hyperlipidemia, Hypertension, Renal insufficiency, and Vertigo.   Outpatient Encounter Medications as of 02/27/2021  Medication Sig   traMADol (ULTRAM) 50 MG tablet Take 1-2 tab p.o. q8hrs prn pain   acetaminophen (TYLENOL) 650 MG CR tablet Take 650 mg by mouth every 8 (eight) hours as needed for pain.   allopurinol (ZYLOPRIM) 300 MG tablet Take 1 tablet (300 mg total) by mouth daily. Start this after gout flare.   apixaban (ELIQUIS) 5 MG TABS tablet Take 1 tablet (5 mg total) by mouth 2 (two) times daily.   atorvastatin (LIPITOR) 20 MG tablet TAKE 1 TABLET BY MOUTH EVERY DAY   Balsam [place]-Castor Oil (VENELEX) OINT Apply 1 application topically 3 (three) times daily as needed (irritation). Apply to sacrum and bilateral buttocks    BD PEN NEEDLE NANO 2ND GEN 32G X 4 MM MISC Use with insulin pen qid.   clotrimazole-betamethasone (LOTRISONE) cream Apply 1 application topically 2 (two) times daily. (Patient taking differently: Apply 1 application topically 2 (two) times daily as needed (irritation).)   colchicine 0.6 MG tablet Take 1 tab p.o.daily prn gout flare.   diltiazem (CARDIZEM CD) 360 MG 24 hr capsule TAKE 1 CAPSULE BY MOUTH EVERY DAY   empagliflozin (JARDIANCE) 10 MG TABS tablet Take 1 tablet (10 mg total) by mouth daily before breakfast.   ferrous sulfate 325 (65 FE) MG tablet Take 325 mg by mouth. Takes one every other day   LEVEMIR FLEXTOUCH 100 UNIT/ML FlexTouch Pen Inject 24 Units into the skin at bedtime.   metoprolol tartrate (LOPRESSOR) 25 MG tablet Take 1 tablet (25 mg total) by mouth 2 (two) times daily.   NOVOLOG FLEXPEN 100 UNIT/ML FlexPen INJECT 15 UNITS INTO THE SKIN 3 (THREE) TIMES DAILY WITH MEALS   ONETOUCH ULTRA test strip 1 each 2 (two) times daily.   potassium chloride (KLOR-CON) 10 MEQ tablet TAKE 1 TABLET BY MOUTH EVERY DAY   sertraline (ZOLOFT) 100 MG tablet Take 1 tab p.o. qd   torsemide (DEMADEX) 20 MG tablet Take 1 tablet (20 mg total) by mouth 2 (two) times daily.   [DISCONTINUED] colchicine 0.6 MG tablet Take by mouth.   [DISCONTINUED] JARDIANCE 10 MG TABS tablet TAKE 1 TABLET (10 MG TOTAL) BY  MOUTH DAILY BEFORE BREAKFAST. (Patient taking differently: Take 10 mg by mouth daily.)   [DISCONTINUED] LEVEMIR FLEXTOUCH 100 UNIT/ML FlexPen Inject 22 Units into the skin at bedtime.   [DISCONTINUED] metoprolol tartrate (LOPRESSOR) 25 MG tablet TAKE 1 TABLET BY MOUTH TWICE A DAY (Patient taking differently: Take 25 mg by mouth 2 (two) times daily.)   [DISCONTINUED] sertraline (ZOLOFT) 100 MG tablet Take 1 tab p.o. qd (Patient taking differently: Take 100 mg by mouth daily. Take 1 tab p.o. qd)   No facility-administered encounter medications on file as of 02/27/2021.     Review of Systems   Constitutional:  Negative for chills and fever.  HENT:  Negative for congestion, rhinorrhea and sore throat.   Respiratory:  Negative for cough, shortness of breath and wheezing.   Cardiovascular:  Negative for chest pain and leg swelling.  Gastrointestinal:  Negative for abdominal pain, diarrhea, nausea and vomiting.  Genitourinary:  Negative for dysuria and frequency.  Musculoskeletal:  Positive for arthralgias (left knee and ankle pain, h/o gout).  Skin:  Negative for rash.  Neurological:  Negative for dizziness, weakness and headaches.  Psychiatric/Behavioral:  Positive for dysphoric mood (suboptimal on meds). Negative for agitation, behavioral problems, self-injury, sleep disturbance and suicidal ideas. The patient is not nervous/anxious.     Vitals BP (!) 107/58   Pulse 76   Temp (!) 97.2 F (36.2 C) (Oral)   Ht 5\' 10"  (1.778 m)   Wt 185 lb 12.8 oz (84.3 kg)   SpO2 97%   BMI 26.66 kg/m   Objective:   Physical Exam Vitals and nursing note reviewed.  Constitutional:      General: He is not in acute distress.    Appearance: Normal appearance. He is not ill-appearing.  HENT:     Head: Normocephalic.     Nose: Nose normal. No congestion.     Mouth/Throat:     Mouth: Mucous membranes are moist.     Pharynx: No oropharyngeal exudate.  Eyes:     Extraocular Movements: Extraocular movements intact.     Conjunctiva/sclera: Conjunctivae normal.     Pupils: Pupils are equal, round, and reactive to light.  Cardiovascular:     Rate and Rhythm: Normal rate and regular rhythm.     Pulses: Normal pulses.     Heart sounds: Normal heart sounds. No murmur heard. Pulmonary:     Effort: Pulmonary effort is normal.     Breath sounds: Normal breath sounds. No wheezing, rhonchi or rales.  Musculoskeletal:        General: Normal range of motion.     Right lower leg: No edema.     Left lower leg: No edema.     Comments: +left knee swollen, no erythema or warmth. +mild left ankle  swelling, no erythema, rash, or warmth  Skin:    General: Skin is warm and dry.     Findings: No rash.  Neurological:     General: No focal deficit present.     Mental Status: He is alert and oriented to person, place, and time.     Cranial Nerves: No cranial nerve deficit.  Psychiatric:        Mood and Affect: Mood normal.        Behavior: Behavior normal.        Thought Content: Thought content normal.        Judgment: Judgment normal.     Assessment and Plan   1. Type 2 diabetes mellitus with stage 3b chronic  kidney disease and hypertension ([place]) - Hemoglobin A1c - Microalbumin, urine - empagliflozin (JARDIANCE) 10 MG TABS tablet; Take 1 tablet (10 mg total) by mouth daily before breakfast.  Dispense: 30 tablet; Refill: 5 - LEVEMIR FLEXTOUCH 100 UNIT/ML FlexTouch Pen; Inject 24 Units into the skin at bedtime.  Dispense: 15 mL; Refill: 1  2. Chronic gout of left knee, unspecified cause - traMADol (ULTRAM) 50 MG tablet; Take 1-2 tab p.o. q8hrs prn pain  Dispense: 45 tablet; Refill: 1 - colchicine 0.6 MG tablet; Take 1 tab p.o.daily prn gout flare.  Dispense: 30 tablet; Refill: 0  3. Stage 3b chronic kidney disease ([place])  4. Depression, major, single episode, mild ([facility]) - sertraline (ZOLOFT) 100 MG tablet; Take 1 tab p.o. qd  Dispense: 90 tablet; Refill: 1  5. Primary hypertension - metoprolol tartrate (LOPRESSOR) 25 MG tablet; Take 1 tablet (25 mg total) by mouth 2 (two) times daily.  Dispense: 180 tablet; Refill: 1   Anxiety/depression- suboptimal.  Pt stating wanting to cont with zoloft 100mg ,  in future may want to increase at next visit.  May discuss at next visit with new provider if wanting to increase this.  Gout- cont with cholcicine with flares then when resolves go back to allopurinol. Take tramadol prn with gout flares.  Htn- stable. Cont meds.  Ckd-stage 3- stable. Cont f/u with neprhology.  Dm2-stable. Controlled. Last a1c 6.6. Cont meds.  Return in about  6 months (around 08/30/2021).

## 2021-02-27 NOTE — Patient Instructions (Signed)
Take 24 units levimir at night.

## 2021-02-28 LAB — HEMOGLOBIN A1C
Est. average glucose Bld gHb Est-mCnc: 143 mg/dL
Hgb A1c MFr Bld: 6.6 % — ABNORMAL HIGH (ref 4.8–5.6)

## 2021-02-28 LAB — MICROALBUMIN, URINE: Microalbumin, Urine: 133.6 ug/mL

## 2021-03-04 ENCOUNTER — Encounter: Payer: Self-pay | Admitting: Internal Medicine

## 2021-03-04 ENCOUNTER — Other Ambulatory Visit: Payer: Self-pay

## 2021-03-04 ENCOUNTER — Ambulatory Visit (HOSPITAL_COMMUNITY)
Admission: RE | Admit: 2021-03-04 | Discharge: 2021-03-04 | Disposition: A | Discharge status: Home / Self Care | Payer: Medicare HMO | Source: Ambulatory Visit | Attending: Internal Medicine | Admitting: Internal Medicine

## 2021-03-04 ENCOUNTER — Ambulatory Visit: Payer: Medicare HMO | Admitting: Internal Medicine

## 2021-03-04 DIAGNOSIS — J984 Other disorders of lung: Secondary | ICD-10-CM | POA: Diagnosis not present

## 2021-03-04 DIAGNOSIS — T462X1A Poisoning by other antidysrhythmic drugs, accidental (unintentional), initial encounter: Secondary | ICD-10-CM | POA: Diagnosis not present

## 2021-03-04 DIAGNOSIS — J9601 Acute respiratory failure with hypoxia: Secondary | ICD-10-CM | POA: Diagnosis not present

## 2021-03-04 DIAGNOSIS — M19011 Primary osteoarthritis, right shoulder: Secondary | ICD-10-CM | POA: Diagnosis not present

## 2021-03-04 DIAGNOSIS — T462X5A Adverse effect of other antidysrhythmic drugs, initial encounter: Secondary | ICD-10-CM | POA: Insufficient documentation

## 2021-03-04 DIAGNOSIS — J849 Interstitial pulmonary disease, unspecified: Secondary | ICD-10-CM | POA: Diagnosis not present

## 2021-03-04 DIAGNOSIS — M19012 Primary osteoarthritis, left shoulder: Secondary | ICD-10-CM | POA: Diagnosis not present

## 2021-03-04 NOTE — Progress Notes (Signed)
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 Delton Coombes 07/17/20 at [facility] at 9:30AM       Discharge Condition: Stable CODE STATUS: DNR Diet recommendation: Heart Healthy / Carb Modified     Brief/Interim Summary: 75 y.o. year old male with a history of paroxysmal atrial fibrillation on apixaban, CKD stage III, hypertension, diastolic CHF, hyperlipidemia, diabetes mellitus type 2, macrocytic anemia following with hematology s/p iron injection and receiving Retacrit, and adenomatous colon polyps in 2011 who was admitted 06/24/20 with sepsis and acute respiratory failure in the setting of pneumonia and pulmonary edema with acute on chronic diastolic CHF.  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 Melvyn Novas and Dr Halford Chessman -Stopped amiodarone -Treated with IV Solu-Medrol started 06/28/2020 through 07/02/2020,  -Transitioned to prednisone 40 mg daily on 12/8 with plans to decrease by 10 mg every 7 days -Oxygen has been weaned down to less than 2 L/min from 15 L last week No longer requiring BiPAP -Wean oxygen as tolerated for saturation greater 92% BC x 2  11/29 >>> RESP viral  Panel  PCR 11/29 neg MRSA PCR   11/30  Neg Strep urinary ag  11/30 neg  Legionella urinary ag  11/30 >>>neg  -Overall significant improvement since starting steroids on 06/28/2020 -Repeat chest x-ray on 07/02/2020 shows improvement -Follow-up appointment with pulmonology has been scheduled     Acute on chronic diastolic CHF -Volume overload status-worsened with transfusion of PRBC -Continue Bumex twice daily>>transition back to home torsemide, but increase to bid -Continue on fluid restriction -Plan will be to discharge on p.o. torsemide as patient has tolerated this in the past -Daily weights -Accurate I's and O's -05/08/2020 echo EF 65 to 70%, no WMA, grade 1 DD, trivial MR, moderate AAS --BNP is elevated -Currently, volume status improving   DM2--uncontrolled with hyperglycemia - last A1c 8.3 reflecting uncontrolled DM with hyperglycemia -NovoLog sliding scale -Holding glipizide--restart after d/c -Blood sugars uncontrolled with hyperglycemia, likely related to steroids -Adjusting Levemir and NovoLog with meals -may need to decrease/stop levemir and novolog as steroids are weaned   CAP/Lobar pneumonia -COVID-19 PCR negative -Patient completed ceftriaxone and doxycycline -Continue bronchodilators and mucolytics -Repeat chest x-ray from 07/02/2020 noted   Severe sepsis -Present on admission -Presented with leukocytosis and tachypnea -Secondary to pneumonia as above -Lactic acid peaked 3.4 -Completed antibiotics as above -Sepsis pathophysiology appears to have resolved   Acute on chronic renal failure CKD stage IIIb -Baseline creatinine 2.0-2.2 -Presented with serum creatinine 2.51 -Monitor closely with diuresis ----renally adjust medications, avoid nephrotoxic agents / dehydration  / hypotension   Hematochezia/ABLA -GI consulted -baseline Hgb 9 -presented with Hgb 7.5 -Received a total of 2 units of PRBC this admission -Follow-up hemoglobin improved to 9 and has  been fluctuating between  8 and 9 -GI consult appreciated -Patient's cardiopulmonary  status precludes endoluminal evaluation -Advance to GI soft diet   Paroxysmal Atrial Fibrillation -holding apixaban due to hematochezia -Discussed with GI, Dr. Jenetta Downer who agreed that it would be reasonable to hold anticoagulation until he can be seen in follow-up by GI and further work-up including endoluminal evaluation can be performed -Close follow-up with GI will be scheduled -Continue Cardizem CD -Stopped amiodarone due to concerns about possible amiodarone induced pneumonitis -start ASA 81 mg in lieu of apixaban   Essential Hypertension--stable -Continue diltiazem CD -Continue metoprolol heart rate   Hyperlipidemia -continue statin   Hypokalemia/Hypomagnesemia--- replace and recheck, electrolytes abnormalities may persist due to high-dose diuretic use   Leukocytosis -Chart reviewed and patient has seen hem/onc in the past for macrocytic anemia -He had a bone marrow biopsy done in 03/2020 that showed hypercellular marrow with trilineage dysplasia -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 Chryl Heck and Dr. Burr Medico, oncology who felt it was reasonable to discharge the patient in follow-up further work-up as an outpatient -He will be arranged to be seen in the oncology clinic--07/17/20 with Dr. Delton Coombes at 9:30 AM   Social/ethics--plan of care, advanced directives and goals of care discussed with patient and patient's 2 daughters Jack Mejia and Jack Mejia -Palliative care consult appreciated patient is a DNR/DNI with full scope of treatment    History of Present Illness  08/05/2020  Pulmonary/ 1st office eval/ Jack Mejia / [facility]  Chief Complaint  Patient presents with   Follow-up    Fatigued  Dyspnea:   Gradually improving, some out door walking up to 5 minutes with 02 sats 90-98% Cough: none  Sleep: flat in bed / one pillow SABA use: none Last prednisone was  08/03/20  Rec We will check your 02 level walking today  Make sure you check your oxygen saturation at your highest level of activity When you go to lab tomorrow at [facility] we will be obtaining also a Sed Rate and call the results Use over the counter ear wax kit and follow up with your ear doctor as planned  Pulmonary follow up is as needed     03/04/2021  f/u ov/[place] office/[facility] re: amiodarone toxicity / off prednisone since 07/2020 Chief Complaint  Patient presents with   Follow-up    Fatigue has improved some, but still an issue. He states his breathing is doing well.   Dyspnea:  not very active but not limited by breathing  Cough: none  Sleeping: ok flat one pillow SABA use: none  02: none  Covid status: x4 Lung cancer screening: n/a   No obvious day to day or daytime variability or assoc excess/ purulent sputum or mucus plugs or hemoptysis or cp or chest tightness, subjective wheeze or overt sinus or hb symptoms.   Sleeping  without nocturnal  or early am exacerbation  of respiratory  c/o's or need for noct saba. Also denies any obvious fluctuation of symptoms with weather or environmental changes or other aggravating or alleviating factors except as outlined above   No unusual exposure hx or h/o childhood pna/ asthma or knowledge of premature birth.  Current Allergies, Complete Past Medical History, Past Surgical History, Family History, and Social History were reviewed in [employer] record.  ROS  The following are not active complaints unless  bolded Hoarseness, sore throat, dysphagia, dental problems, itching, sneezing,  nasal congestion or discharge of excess mucus or purulent secretions, ear ache,   fever, chills, sweats, unintended wt loss or wt gain, classically pleuritic or exertional cp,   orthopnea pnd or arm/hand swelling  or leg swelling, presyncope, palpitations, abdominal pain, anorexia, nausea, vomiting, diarrhea  or change in bowel habits or change in bladder habits, change in stools or change in urine, dysuria, hematuria,  rash, arthralgias, visual complaints, headache, numbness, weakness or ataxia or problems with walking or coordination,  change in mood or  memory.        Current Meds  Medication Sig   acetaminophen (TYLENOL) 650 MG CR tablet Take 650 mg by mouth every 8 (eight) hours as needed for pain.   allopurinol (ZYLOPRIM) 300 MG tablet Take 1 tablet (300 mg total) by mouth daily. Start this after gout flare.   apixaban (ELIQUIS) 5 MG TABS tablet Take 1 tablet (5 mg total) by mouth 2 (two) times daily.   atorvastatin (LIPITOR) 20 MG tablet TAKE 1 TABLET BY MOUTH EVERY DAY   Balsam [place]-Castor Oil (VENELEX) OINT Apply 1 application topically 3 (three) times daily as needed (irritation). Apply to sacrum and bilateral buttocks   BD PEN NEEDLE NANO 2ND GEN 32G X 4 MM MISC Use with insulin pen qid.   clotrimazole-betamethasone (LOTRISONE) cream Apply 1 application topically 2 (two) times daily. (Patient taking differently: Apply 1 application topically 2 (two) times daily as needed (irritation).)   colchicine 0.6 MG tablet Take 1 tab p.o.daily prn gout flare.   diltiazem (CARDIZEM CD) 360 MG 24 hr capsule TAKE 1 CAPSULE BY MOUTH EVERY DAY   empagliflozin (JARDIANCE) 10 MG TABS tablet Take 1 tablet (10 mg total) by mouth daily before breakfast.   ferrous sulfate 325 (65 FE) MG tablet Take 325 mg by mouth. Takes one every other day   LEVEMIR FLEXTOUCH 100 UNIT/ML FlexTouch Pen Inject 24 Units into the skin at bedtime.   metoprolol tartrate (LOPRESSOR) 25 MG tablet Take 1 tablet (25 mg total) by mouth 2 (two) times daily.   NOVOLOG FLEXPEN 100 UNIT/ML FlexPen INJECT 15 UNITS INTO THE SKIN 3 (THREE) TIMES DAILY WITH MEALS   ONETOUCH ULTRA test strip 1 each 2 (two) times  daily.   potassium chloride (KLOR-CON) 10 MEQ tablet TAKE 1 TABLET BY MOUTH EVERY DAY   sertraline (ZOLOFT) 100 MG tablet Take 1 tab p.o. qd   torsemide (DEMADEX) 20 MG tablet Take 1 tablet (20 mg total) by mouth 2 (two) times daily.   traMADol (ULTRAM) 50 MG tablet Take 1-2 tab p.o. q8hrs prn pain             Objective:       Wt Readings from Last 3 Encounters:  03/04/21 185 lb 12.8 oz (84.3 kg)  02/27/21 185 lb 12.8 oz (84.3 kg)  02/13/21 189 lb 14.4 oz (86.1 kg)      Vital signs reviewed  03/04/2021  - Note at rest 02 sats  99% on RA   General appearance:  amb wm walks with suction cup    HEENT : pt wearing mask not removed for exam due to covid -19 concerns.    NECK :  without JVD/Nodes/TM/ nl carotid upstrokes bilaterally   LUNGS: no acc muscle use,  Nl contour chest which is clear to A and P bilaterally without cough on insp or exp maneuvers   CV:  RRR  no s3 or murmur or  increase in P2, and no edema   ABD:  soft and nontender with nl inspiratory excursion in the supine position. No bruits or organomegaly appreciated, bowel sounds nl  MS:  Nl gait/ ext warm without deformities, calf tenderness, cyanosis or clubbing No obvious joint restrictions   SKIN: warm and dry without lesions    NEURO:  alert, approp, nl sensorium with  no motor or cerebellar deficits apparent.     CXR PA and Lateral:   03/04/2021 :    I personally reviewed images and agree with radiology impression as follows:      No active cardiopulmonary disease.           Assessment

## 2021-03-04 NOTE — Assessment & Plan Note (Signed)
11/29-12/06/2020 sepsis picture with acute respiratory failure with hypoxia possibly related to amiodarone induced pneumonitis.  Clinically CAP and component of acute on chronic CHF could not be ruled out. Profound hypoxia necessitated BiPAP and O2 flow up to 15 L/min. Significant response to parenteral steroids which were transitioned to a prednisone wean.  Aggressive treatment of CHF and parenteral antibiotics were also prescribed. - 06/28/20 stopped amiodarone, started steroids  - last day of prednisone rx 08/03/20 -  08/05/2020   Walked RA  approx   600 ft  @ slow pace  stopped due to  End of study with sats 97% and clear cxr   >  Leave off prednisone  -  03/04/2021   Walked RA 3 laps @ approx 26ft each @ avg  pace  stopped due to end of study, very min sob with sats 97% at end   Clinical course typical of the acute form of amiodarone toxicity, appears resp failure remains completely resolved with no further pulmonary f/u needed

## 2021-03-04 NOTE — Patient Instructions (Signed)
We will walk you in the hallway today to verify your new baseline oxygen level on exertion   Please remember to go to the  x-ray department  @  [facility] for your tests - we will call you with the results when they are available     Pulmonary follow up is as needed

## 2021-03-05 ENCOUNTER — Encounter: Payer: Self-pay | Admitting: Internal Medicine

## 2021-03-05 NOTE — Assessment & Plan Note (Signed)
Clinical dx with onset of symptoms mid Oct 2021 - 06/28/20 stopped amiodarone, started steroids  - last day of prednisone rx 08/03/20 -  08/05/2020   Walked RA  approx   600 ft  @ slow pace  stopped due to  End of study with sats 97% and clear cxr    No evidence of resdiual ILD or even ex hypoxemia so no need for further pulmonary f/u  Each maintenance medication was reviewed in detail including emphasizing most importantly the difference between maintenance and prns and under what circumstances the prns are to be triggered using an action plan format where appropriate.  Total time for H and P, chart review, counseling,  directly observing portions of ambulatory 02 saturation study/ and generating customized AVS unique to this summary final office visit / same day charting  > 30 min

## 2021-03-06 ENCOUNTER — Encounter: Payer: Self-pay | Admitting: *Deleted

## 2021-03-19 ENCOUNTER — Other Ambulatory Visit: Payer: Self-pay | Admitting: Family Medicine

## 2021-03-28 ENCOUNTER — Other Ambulatory Visit: Payer: Self-pay | Admitting: Nurse Practitioner

## 2021-04-14 ENCOUNTER — Other Ambulatory Visit: Payer: Self-pay | Admitting: Family Medicine

## 2021-04-15 ENCOUNTER — Telehealth: Payer: Self-pay | Admitting: Family Medicine

## 2021-04-15 ENCOUNTER — Ambulatory Visit (INDEPENDENT_AMBULATORY_CARE_PROVIDER_SITE_OTHER): Payer: Medicare HMO | Admitting: Family Medicine

## 2021-04-15 ENCOUNTER — Other Ambulatory Visit: Payer: Self-pay

## 2021-04-15 DIAGNOSIS — U071 COVID-19: Secondary | ICD-10-CM

## 2021-04-15 MED ORDER — MOLNUPIRAVIR EUA 200MG CAPSULE: 4.0000 | ORAL_CAPSULE | Freq: Two times a day (BID) | ORAL | 0 refills | Status: AC

## 2021-04-15 NOTE — Progress Notes (Signed)
   Subjective:    Patient ID: Jack Mejia, male    DOB: [birth year], 75 y.o.   MRN: [id number]  HPI Pt tested positive for COVID last night. Pt symptoms started on Sunday. Lightheaded, dizzy, bad headache, and fatigue. Pt wife states he slept for 2 days straight. Did take Asprin for headache but did not help.    Patient presents today with respiratory illness Number of days present-3  Symptoms include-fatigue tiredness laying around.  Mild headache no wheezing or difficulty breathing  Presence of worrisome signs (severe shortness of breath, lethargy, etc.) -no shortness of breath no vomiting today  Recent/current visit to urgent care or ER-none  Recent direct exposure to Covid-none he knows of  Any current Covid testing-positive test   Virtual Visit via Telephone Note  I connected with Jack Mejia on 04/15/21 at  2:30 PM EDT by telephone and verified that I am speaking with the correct person using two identifiers.  Location: Patient: home Provider: office    I discussed the limitations, risks, security and privacy concerns of performing an evaluation and management service by telephone and the availability of in person appointments. I also discussed with the patient that there may be a patient responsible charge related to this service. The patient expressed understanding and agreed to proceed.   History of Present Illness:    Observations/Objective:   Assessment and Plan:   Follow Up Instructions:    I discussed the assessment and treatment plan with the patient. The patient was provided an opportunity to ask questions and all were answered. The patient agreed with the plan and demonstrated an understanding of the instructions.   The patient was advised to call back or seek an in-person evaluation if the symptoms worsen or if the condition fails to improve as anticipated.  I provided 15 minutes of non-face-to-face time during this encounter.      Review  of Systems     Objective:   Physical Exam Patient is on blood thinner unable to come off of this Today's visit was via telephone Physical exam was not possible for this visit        Assessment & Plan:  COVID infection  Covid infection This is a viral process.  Mild cases are treated with supportive measures at home such as Tylenol rest fluids.  In some situations monoclonal antibodies may be appropriate depending on the patient's risk criteria.  The patient was educated regarding progressive illness including respiratory, persistent vomiting, change in mental status.  If any of these occur ER evaluation is recommended. Patient was educated about the following as well Covid-19 respiratory warning: Covid-19 is a virus that causes hypoxia (low oxygen level in blood) in some people. If you develop any changes in your usual breathing pattern: difficulty catching your breath, more short winded with activity or with resting, or anything that concerns you about your breathing, do not hesitate to go to the emergency department immediately for evaluation. Please do not delay to get treatment.   Agrees with plan of care discussed today. Understands warning signs to seek further care: Chest pain, shortness of breath, mental confusion, profuse vomiting, any significant change in health. Understands to follow-up if symptoms do not improve, or worsen.    Molnupiravir was prescribed.  He is on Eliquis so therefore Paxlovid was a no go We will recheck him with him tomorrow to see how things are going if he is getting worse we will do a in person visit

## 2021-04-15 NOTE — Telephone Encounter (Signed)
Jack Mejia, Jack Mejia are scheduled for a virtual visit with your provider today.    Just as we do with appointments in the office, we must obtain your consent to participate.  Your consent will be active for this visit and any virtual visit you may have with one of our providers in the next 365 days.    If you have a MyChart account, I can also send a copy of this consent to you electronically.  All virtual visits are billed to your insurance company just like a traditional visit in the office.  As this is a virtual visit, video technology does not allow for your provider to perform a traditional examination.  This may limit your provider's ability to fully assess your condition.  If your provider identifies any concerns that need to be evaluated in person or the need to arrange testing such as labs, EKG, etc, we will make arrangements to do so.    Although advances in technology are sophisticated, we cannot ensure that it will always work on either your end or our end.  If the connection with a video visit is poor, we may have to switch to a telephone visit.  With either a video or telephone visit, we are not always able to ensure that we have a secure connection.   I need to obtain your verbal consent now.   Are you willing to proceed with your visit today?   Jack Mejia has provided verbal consent on 04/15/2021 for a virtual visit (video or telephone).   Vicente Males, LPN 4/93/5521  7:47 PM

## 2021-04-16 ENCOUNTER — Telehealth: Payer: Self-pay

## 2021-04-16 ENCOUNTER — Ambulatory Visit (INDEPENDENT_AMBULATORY_CARE_PROVIDER_SITE_OTHER): Payer: Medicare HMO | Admitting: Family Medicine

## 2021-04-16 VITALS — Temp 97.9°F | Wt 186.4 lb

## 2021-04-16 DIAGNOSIS — U071 COVID-19: Secondary | ICD-10-CM | POA: Diagnosis not present

## 2021-04-16 NOTE — Progress Notes (Signed)
   Subjective:    Patient ID: Jack Mejia, male    DOB: 27-Apr-1946, 75 y.o.   MRN: [id number]  HPI Pt coming in to have lungs checked along with O2 checked per provider. Pt is COVID positive and is having headache, dizziness, malaise.  Significant fatigue tiredness feeling rundown since getting sick earlier this week.  Relates congestion coughing no wheezing or difficulty breathing no vomiting.  PMH benign  Patient states he had a illness a couple years ago ever since then he has not done well with resting finds himself feeling fatigued a lot. Review of Systems     Objective:   Physical Exam Lungs are clear heart is regular pulse normal extremities no edema skin warm dry       Assessment & Plan:  COVID Tolerating medication currently Encourage healthy intake of food and liquids No sign of respiratory distress O2 saturation 99% Warning signs discussed Rest up over the next few days If progressive troubles or worse to notify us

## 2021-04-16 NOTE — Telephone Encounter (Signed)
Spoke with Jack Mejia on dpr , she states patient is feeling about the same today with headache, dizziness,malaise. Reports no fevers or any GI problems. Started taking prescription today, please advise

## 2021-04-16 NOTE — Telephone Encounter (Signed)
Let us do side door visit toward the end of the day so we can listen to his lungs check his oxygen and check his blood pressure

## 2021-04-16 NOTE — Progress Notes (Signed)
Spoke with Lelon Frohlich on dpr , she states patient is feeling about the same today with headache, dizziness,malaise. Reports no fevers or any GI problems. Started taking prescription today, please advise

## 2021-04-17 ENCOUNTER — Telehealth: Payer: Self-pay | Admitting: Family Medicine

## 2021-04-17 NOTE — Telephone Encounter (Signed)
Jack Mejia called to inform that Mr. Beddow tested positive for COVID on Sunday but did a second test today and it was negative. Wanted to know if he should continue the meds given to him.   CB#  [phone]

## 2021-04-17 NOTE — Telephone Encounter (Signed)
Pt significant other Lelon Frohlich [facility]) contacted and verbalized understanding

## 2021-04-17 NOTE — Telephone Encounter (Signed)
Based upon his symptoms as well as based upon the initial test being positive I recommend completing all 5 days of the medicine Also recommend that he stay away from other people for 10 days to lessen the risk of other people getting sick

## 2021-05-03 NOTE — Progress Notes (Signed)
[facility] [place], [place] [address]   CLINIC:  Medical Oncology/Hematology  PCP:  Erven Colla, DO [address] / [place] [address]  [phone]  REASON FOR VISIT:  Follow-up for macrocytic anemia  PRIOR THERAPY: Intermittent Venofer  CURRENT THERAPY: Retacrit 30,000 units every 4 weeks  INTERVAL HISTORY:  [name], a 75 y.o. male, returns for routine follow-up for his macrocytic anemia. Ziaire was last seen on 02/03/2021.  Today he reports feeling good. He reports easy bleeding, and he has been taking Eliquis for 3-4 weeks. He reports a Covid infection 8 weeks ago. He is taking an iron tablet every other day. He reports normal BM, and occasional blood when blowing his nose. He denies leg swellings.   REVIEW OF SYSTEMS:  Review of Systems  Constitutional:  Negative for appetite change and fatigue (50%).  Cardiovascular:  Negative for leg swelling.  Gastrointestinal:  Negative for blood in stool, constipation and diarrhea.  Genitourinary:  Positive for frequency.   Neurological:  Positive for numbness.  Hematological:  Bruises/bleeds easily.  Psychiatric/Behavioral:  Positive for sleep disturbance.   All other systems reviewed and are negative.  PAST MEDICAL/SURGICAL HISTORY:  Past Medical History:  Diagnosis Date   Anxiety    Arthritis    Asthma    Atrial fibrillation ([place])    a. s/p DCCV in 03/2019   CAD (coronary artery disease) 06/29/2019   Cramps of left lower extremity    Depression    Diabetes mellitus    type 2 for 7-8 yrs   Dyspnea    Elevated liver enzymes    Fatty liver    Gout    History of kidney stones    Hyperlipidemia    Hypertension    Renal insufficiency    Vertigo    Past Surgical History:  Procedure Laterality Date   BIOPSY  10/11/2020   Procedure: BIOPSY;  Surgeon: Harvel Quale, MD;  [facility];  Service: Gastroenterology;;   CARDIOVERSION N/A 04/21/2019    Procedure: CARDIOVERSION;  Surgeon: Sanda Klein, MD;  Location: [place];  Service: Cardiovascular;  Laterality: N/A;   CARPAL TUNNEL RELEASE     both wrist   cataract surgery     bilateral   CHOLECYSTECTOMY     COLONOSCOPY  2011   COLONOSCOPY WITH PROPOFOL N/A 10/11/2020   Procedure: COLONOSCOPY WITH PROPOFOL;  Surgeon: Harvel Quale, MD;  [facility];  Service: Gastroenterology;  Laterality: N/A;  AM   ESOPHAGOGASTRODUODENOSCOPY (EGD) WITH PROPOFOL N/A 10/11/2020   Procedure: ESOPHAGOGASTRODUODENOSCOPY (EGD) WITH PROPOFOL;  Surgeon: Harvel Quale, MD;  [facility];  Service: Gastroenterology;  Laterality: N/A;   EYE SURGERY     HEMORROIDECTOMY     KNEE SURGERY Left    LEFT HEART CATH AND CORONARY ANGIOGRAPHY N/A 05/23/2019   Procedure: LEFT HEART CATH AND CORONARY ANGIOGRAPHY;  Surgeon: Belva Crome, MD;  Location: [place] [facility];  Service: Cardiovascular;  Laterality: N/A;   LUMBAR LAMINECTOMY/DECOMPRESSION MICRODISCECTOMY N/A 08/06/2016   Procedure: LUMBAR THREE- LUMBAR FIVE  DECOMPRESSIVE LUMBAR LAMINECTOMY;  Surgeon: Jovita Gamma, MD;  Location: [place];  Service: Neurosurgery;  Laterality: N/A;   POLYPECTOMY  10/11/2020   Procedure: POLYPECTOMY;  Surgeon: Harvel Quale, MD;  [facility];  Service: Gastroenterology;;    SOCIAL HISTORY:  Social History   Socioeconomic History   Marital status: Divorced    Spouse name: Not on  file   Number of children: 2   Years of education: Not on file   Highest education level: Not on file  Occupational History   Occupation: RETIRED  Tobacco Use   Smoking status: Former    Packs/day: 4.00    Years: 20.00    Pack years: 80.00    Types: Cigarettes    Quit date: 10/14/1982    Years since quitting: 38.5   Smokeless tobacco: Never   Tobacco comments:    29 yrs ago  Vaping Use   Vaping Use: Never used  Substance and Sexual Activity   Alcohol use: No    Drug use: No   Sexual activity: Not Currently    Birth control/protection: None  Other Topics Concern   Not on file  Social History Narrative   Not on file   Social Determinants of Health   Financial Resource Strain: Medium Risk   Difficulty of Paying Living Expenses: Somewhat hard  Food Insecurity: No Food Insecurity   Worried About Charity fundraiser in the Last Year: Never true   Ran Out of Food in the Last Year: Never true  Transportation Needs: No Transportation Needs   Lack of Transportation (Medical): No   Lack of Transportation (Non-Medical): No  Physical Activity: Insufficiently Active   Days of Exercise per Week: 4 days   Minutes of Exercise per Session: 10 min  Stress: No Stress Concern Present   Feeling of Stress : Not at all  Social Connections: Moderately Isolated   Frequency of Communication with Friends and Family: More than three times a week   Frequency of Social Gatherings with Friends and Family: More than three times a week   Attends Religious Services: More than 4 times per year   Active Member of [employer] or Organizations: No   Attends Music therapist: Never   Marital Status: Separated  Human resources officer Violence: Not At Risk   Fear of Current or Ex-Partner: No   Emotionally Abused: No   Physically Abused: No   Sexually Abused: No    FAMILY HISTORY:  Family History  Problem Relation Age of Onset   Diabetes Mother    Cancer Mother        unknown kind   Diabetes Sister    COPD Sister    Liver disease Brother    COPD Brother    Diabetes Maternal Grandmother    Diabetes Brother    COPD Sister    Healthy Daughter    Healthy Daughter    Colon cancer Neg Hx     CURRENT MEDICATIONS:  Current Outpatient Medications  Medication Sig Dispense Refill   acetaminophen (TYLENOL) 650 MG CR tablet Take 650 mg by mouth every 8 (eight) hours as needed for pain.     allopurinol (ZYLOPRIM) 300 MG tablet TAKE 1 TABLET (300 MG TOTAL) BY MOUTH  DAILY. START THIS AFTER GOUT FLARE. 90 tablet 0   apixaban (ELIQUIS) 5 MG TABS tablet Take 1 tablet (5 mg total) by mouth 2 (two) times daily.     atorvastatin (LIPITOR) 20 MG tablet TAKE 1 TABLET BY MOUTH EVERY DAY 90 tablet 3   Balsam [place]-Castor Oil (VENELEX) OINT Apply 1 application topically 3 (three) times daily as needed (irritation). Apply to sacrum and bilateral buttocks     BD PEN NEEDLE NANO 2ND GEN 32G X 4 MM MISC Use with insulin pen qid. 120 each 5   clotrimazole-betamethasone (LOTRISONE) cream Apply 1 application topically 2 (two) times daily. (  Patient taking differently: Apply 1 application topically 2 (two) times daily as needed (irritation).) 30 g 3   colchicine 0.6 MG tablet Take 1 tab p.o.daily prn gout flare. 30 tablet 0   diltiazem (CARDIZEM CD) 360 MG 24 hr capsule TAKE 1 CAPSULE BY MOUTH EVERY DAY 90 capsule 3   empagliflozin (JARDIANCE) 10 MG TABS tablet Take 1 tablet (10 mg total) by mouth daily before breakfast. 30 tablet 5   ferrous sulfate 325 (65 FE) MG tablet Take 325 mg by mouth. Takes one every other day     LEVEMIR FLEXTOUCH 100 UNIT/ML FlexTouch Pen Inject 24 Units into the skin at bedtime. 15 mL 1   metoprolol tartrate (LOPRESSOR) 25 MG tablet Take 1 tablet (25 mg total) by mouth 2 (two) times daily. 180 tablet 1   NOVOLOG FLEXPEN 100 UNIT/ML FlexPen INJECT 15 UNITS INTO THE SKIN 3 (THREE) TIMES DAILY WITH MEALS 15 mL 0   ONETOUCH VERIO test strip TEST TWICE DAILY AS DIRECTED 100 strip 3   potassium chloride (KLOR-CON) 10 MEQ tablet TAKE 1 TABLET BY MOUTH EVERY DAY 90 tablet 1   sertraline (ZOLOFT) 100 MG tablet Take 1 tab p.o. qd 90 tablet 1   torsemide (DEMADEX) 20 MG tablet TAKE 1 TABLET BY MOUTH TWICE A DAY 180 tablet 2   traMADol (ULTRAM) 50 MG tablet Take 1-2 tab p.o. q8hrs prn pain 45 tablet 1   No current facility-administered medications for this visit.    ALLERGIES:  Allergies  Allergen Reactions   Demerol Nausea And Vomiting   Meperidine Hcl  Nausea And Vomiting   Vasotec [Enalapril] Cough   Lasix [Furosemide] Rash    PHYSICAL EXAM:  Performance status (ECOG): 1 - Symptomatic but completely ambulatory  There were no vitals filed for this visit. Wt Readings from Last 3 Encounters:  04/16/21 186 lb 6.4 oz (84.6 kg)  03/04/21 185 lb 12.8 oz (84.3 kg)  02/27/21 185 lb 12.8 oz (84.3 kg)   Physical Exam Vitals reviewed.  Constitutional:      Appearance: Normal appearance.  Cardiovascular:     Rate and Rhythm: Normal rate and regular rhythm.     Pulses: Normal pulses.     Heart sounds: Normal heart sounds.  Pulmonary:     Effort: Pulmonary effort is normal.     Breath sounds: Normal breath sounds.  Neurological:     General: No focal deficit present.     Mental Status: He is alert and oriented to person, place, and time.  Psychiatric:        Mood and Affect: Mood normal.        Behavior: Behavior normal.    LABORATORY DATA:  I have reviewed the labs as listed.  CBC Latest Ref Rng & Units 02/03/2021 01/03/2021 12/06/2020  WBC 4.0 - 10.5 K/uL 4.5 4.4 5.1  Hemoglobin 13.0 - 17.0 g/dL 12.1(L) 11.3(L) 11.5(L)  Hematocrit 39.0 - 52.0 % 36.3(L) 34.2(L) 35.3(L)  Platelets 150 - 400 K/uL 121(L) 141(L) 219   CMP Latest Ref Rng & Units 02/03/2021 11/06/2020 10/03/2020  Glucose 70 - 99 mg/dL 194(H) 152(H) 162(H)  BUN 8 - 23 mg/dL 32(H) 33(H) 24(H)  Creatinine 0.61 - 1.24 mg/dL 1.85(H) 1.98(H) 1.73(H)  Sodium 135 - 145 mmol/L 138 139 137  Potassium 3.5 - 5.1 mmol/L 4.0 3.8 3.3(L)  Chloride 98 - 111 mmol/L 98 100 98  CO2 22 - 32 mmol/L _0 Calcium 8.9 - 10.3 mg/dL 9.3 9.3 8.8(L)  Total Protein  6.5 - 8.1 g/dL 7.3 7.6 7.5  Total Bilirubin 0.3 - 1.2 mg/dL 1.0 1.1 0.9  Alkaline Phos 38 - 126 U/L 128(H) 99 97  AST 15 - 41 U/L _0 ALT 0 - 44 U/L _1 Component Value Date/Time   RBC 3.23 (L) 02/03/2021 1351   MCV 112.4 (H) 02/03/2021 1351   MCV 101 (H) 09/07/2018 1638   MCH 37.5 (H) 02/03/2021 1351   MCHC  33.3 02/03/2021 1351   RDW 15.1 02/03/2021 1351   RDW 12.8 09/07/2018 1638   LYMPHSABS 1.2 02/03/2021 1351   LYMPHSABS 1.3 09/07/2018 1638   MONOABS 1.2 (H) 02/03/2021 1351   EOSABS 0.0 02/03/2021 1351   EOSABS 0.2 09/07/2018 1638   BASOSABS 0.0 02/03/2021 1351   BASOSABS 0.1 09/07/2018 1638    DIAGNOSTIC IMAGING:  I have independently reviewed the scans and discussed with the patient. No results found.   ASSESSMENT:  1.  Macrocytic anemia: -Patient seen at the request of Dr. Wolfgang Phoenix for macrocytic anemia. -CTAP on 04/15/2017 showed no splenomegaly. -Patient has macrocytosis noted since February 2020, gradually worsening. -Colonoscopy on 06/05/2010 shows small polyp in the sigmoid colon colon, right colon, hepatic flexure and transverse colon. -He continues to have occasional bleeding on the tissue paper since Eliquis was started. -Serum erythropoietin was 77.2. -SPEP was negative.  Other nutritional deficiencies were ruled out. -Bone marrow biopsy on 03/29/2020 shows hypercellular marrow with trilineage dysplasia.  Blasts are 2%.  Chromosome analysis and FISH panel were normal. -5 doses of Venofer completed on 01/24/2020. -Retacrit 30,000 units weekly started on 04/08/2020.   2.  CKD: -History of CKD since 2017. -Renal ultrasound on 04/21/2019 shows bilateral renal cysts.   3.  Health maintenance: -Colonoscopy on 10/11/2020 with tubular adenoma in the transverse colon hyperplastic polyp in the rectum. -EGD on 10/11/2020 with normal esophagus, stomach and duodenum.   PLAN:  1.  Low risk MDS: - Last Retacrit was on 09/05/2020 at 30,000 units. - His hemoglobin remained between 11 and 12 when he was off of Eliquis. - Eliquis was again started back in August.  He had COVID in September. - Hemoglobin today is down to 10.3.  Ferritin is 323 and percent saturation 23. - Recommend restarting back on Retacrit every 4 weeks. - Recommend 1 dose of Feraheme.  He is taking iron tablet every  other day. - RTC 3 months for follow-up with repeat ferritin and iron panel.   2.  CKD: - Creatinine today is 1.80 and at the level of baseline.   3.  Atrial fibrillation: - Eliquis was started back sometime in August. - He reported occasional nosebleeds.  Denies any bleeding per rectum or melena.   4.  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  Amiodarone was discontinued. - Continue pulmonary follow-up.  Orders placed this encounter:  No orders of the defined types were placed in this encounter.    Derek Jack, MD [place] [phone]   I, Thana Ates, am acting as a scribe for Dr. Derek Jack.  I, Derek Jack MD, have reviewed the above documentation for accuracy and completeness, and I agree with the above.

## 2021-05-05 ENCOUNTER — Inpatient Hospital Stay (HOSPITAL_COMMUNITY): Payer: Medicare HMO

## 2021-05-05 ENCOUNTER — Other Ambulatory Visit: Payer: Self-pay

## 2021-05-05 ENCOUNTER — Inpatient Hospital Stay (HOSPITAL_BASED_OUTPATIENT_CLINIC_OR_DEPARTMENT_OTHER): Payer: Medicare HMO | Admitting: Hematology

## 2021-05-05 ENCOUNTER — Inpatient Hospital Stay (HOSPITAL_COMMUNITY): Payer: Medicare HMO | Attending: Hematology

## 2021-05-05 VITALS — BP 137/64 | HR 74 | Temp 96.8°F | Resp 18 | Wt 189.5 lb

## 2021-05-05 DIAGNOSIS — D508 Other iron deficiency anemias: Secondary | ICD-10-CM | POA: Diagnosis not present

## 2021-05-05 DIAGNOSIS — N179 Acute kidney failure, unspecified: Secondary | ICD-10-CM

## 2021-05-05 DIAGNOSIS — Z87442 Personal history of urinary calculi: Secondary | ICD-10-CM | POA: Diagnosis not present

## 2021-05-05 DIAGNOSIS — Z79899 Other long term (current) drug therapy: Secondary | ICD-10-CM | POA: Diagnosis not present

## 2021-05-05 DIAGNOSIS — N183 Chronic kidney disease, stage 3 unspecified: Secondary | ICD-10-CM

## 2021-05-05 DIAGNOSIS — Z7901 Long term (current) use of anticoagulants: Secondary | ICD-10-CM | POA: Insufficient documentation

## 2021-05-05 DIAGNOSIS — R748 Abnormal levels of other serum enzymes: Secondary | ICD-10-CM | POA: Insufficient documentation

## 2021-05-05 DIAGNOSIS — I129 Hypertensive chronic kidney disease with stage 1 through stage 4 chronic kidney disease, or unspecified chronic kidney disease: Secondary | ICD-10-CM | POA: Insufficient documentation

## 2021-05-05 DIAGNOSIS — D539 Nutritional anemia, unspecified: Secondary | ICD-10-CM | POA: Insufficient documentation

## 2021-05-05 DIAGNOSIS — E119 Type 2 diabetes mellitus without complications: Secondary | ICD-10-CM | POA: Insufficient documentation

## 2021-05-05 DIAGNOSIS — Z87891 Personal history of nicotine dependence: Secondary | ICD-10-CM | POA: Insufficient documentation

## 2021-05-05 DIAGNOSIS — I2510 Atherosclerotic heart disease of native coronary artery without angina pectoris: Secondary | ICD-10-CM | POA: Insufficient documentation

## 2021-05-05 DIAGNOSIS — D469 Myelodysplastic syndrome, unspecified: Secondary | ICD-10-CM | POA: Insufficient documentation

## 2021-05-05 DIAGNOSIS — I4891 Unspecified atrial fibrillation: Secondary | ICD-10-CM | POA: Diagnosis not present

## 2021-05-05 DIAGNOSIS — N189 Chronic kidney disease, unspecified: Secondary | ICD-10-CM | POA: Diagnosis not present

## 2021-05-05 DIAGNOSIS — E785 Hyperlipidemia, unspecified: Secondary | ICD-10-CM | POA: Diagnosis not present

## 2021-05-05 DIAGNOSIS — R0602 Shortness of breath: Secondary | ICD-10-CM | POA: Insufficient documentation

## 2021-05-05 LAB — IRON AND TIBC
Iron: 62 ug/dL (ref 45–182)
Saturation Ratios: 23 % (ref 17.9–39.5)
TIBC: 269 ug/dL (ref 250–450)
UIBC: 207 ug/dL

## 2021-05-05 LAB — COMPREHENSIVE METABOLIC PANEL
ALT: 18 U/L (ref 0–44)
AST: 20 U/L (ref 15–41)
Albumin: 4 g/dL (ref 3.5–5.0)
Alkaline Phosphatase: 117 U/L (ref 38–126)
Anion gap: 10 (ref 5–15)
BUN: 24 mg/dL — ABNORMAL HIGH (ref 8–23)
CO2: 25 mmol/L (ref 22–32)
Calcium: 8.8 mg/dL — ABNORMAL LOW (ref 8.9–10.3)
Chloride: 101 mmol/L (ref 98–111)
Creatinine, Ser: 1.8 mg/dL — ABNORMAL HIGH (ref 0.61–1.24)
GFR, Estimated: 39 mL/min — ABNORMAL LOW (ref >60)
Glucose, Bld: 322 mg/dL — ABNORMAL HIGH (ref 70–99)
Potassium: 3.7 mmol/L (ref 3.5–5.1)
Sodium: 136 mmol/L (ref 135–145)
Total Bilirubin: 1 mg/dL (ref 0.3–1.2)
Total Protein: 7.1 g/dL (ref 6.5–8.1)

## 2021-05-05 LAB — CBC WITH DIFFERENTIAL/PLATELET
Abs Immature Granulocytes: 0.17 10*3/uL — ABNORMAL HIGH (ref 0.00–0.07)
Basophils Absolute: 0 10*3/uL (ref 0.0–0.1)
Basophils Relative: 0 %
Eosinophils Absolute: 0 10*3/uL (ref 0.0–0.5)
Eosinophils Relative: 0 %
HCT: 30.8 % — ABNORMAL LOW (ref 39.0–52.0)
Hemoglobin: 10.3 g/dL — ABNORMAL LOW (ref 13.0–17.0)
Immature Granulocytes: 4 %
Lymphocytes Relative: 23 %
Lymphs Abs: 0.9 10*3/uL (ref 0.7–4.0)
MCH: 37.7 pg — ABNORMAL HIGH (ref 26.0–34.0)
MCHC: 33.4 g/dL (ref 30.0–36.0)
MCV: 112.8 fL — ABNORMAL HIGH (ref 80.0–100.0)
Monocytes Absolute: 1.6 10*3/uL — ABNORMAL HIGH (ref 0.1–1.0)
Monocytes Relative: 41 %
Neutro Abs: 1.3 10*3/uL — ABNORMAL LOW (ref 1.7–7.7)
Neutrophils Relative %: 32 %
Platelets: 126 10*3/uL — ABNORMAL LOW (ref 150–400)
RBC: 2.73 MIL/uL — ABNORMAL LOW (ref 4.22–5.81)
RDW: 15 % (ref 11.5–15.5)
WBC: 4 10*3/uL (ref 4.0–10.5)
nRBC: 0 % (ref 0.0–0.2)

## 2021-05-05 LAB — FERRITIN: Ferritin: 323 ng/mL (ref 24–336)

## 2021-05-05 MED ORDER — EPOETIN ALFA-EPBX 10000 UNIT/ML IJ SOLN
10000.0000 [IU] | Freq: Once | INTRAMUSCULAR | Status: AC
  Administered 2021-05-05: 10000 [IU] via SUBCUTANEOUS
  Filled 2021-05-05: qty 1

## 2021-05-05 MED ORDER — EPOETIN ALFA-EPBX 20000 UNIT/ML IJ SOLN
20000.0000 [IU] | Freq: Once | INTRAMUSCULAR | Status: AC
  Administered 2021-05-05: 20000 [IU] via SUBCUTANEOUS
  Filled 2021-05-05: qty 1

## 2021-05-05 NOTE — Patient Instructions (Signed)
[facility]  Discharge Instructions: Thank you for choosing [facility] to provide your oncology and hematology care.  If you have a lab appointment with the Cancer Center, please come in thru the Main Entrance and check in at the main information desk.  Wear comfortable clothing and clothing appropriate for easy access to any Portacath or PICC line.   We strive to give you quality time with your provider. You may need to reschedule your appointment if you arrive late (15 or more minutes).  Arriving late affects you and other patients whose appointments are after yours.  Also, if you miss three or more appointments without notifying the office, you may be dismissed from the clinic at the provider's discretion.      For prescription refill requests, have your pharmacy contact our office and allow 72 hours for refills to be completed.    Today you received the following chemotherapy and/or immunotherapy agents retacrit.     To help prevent nausea and vomiting after your treatment, we encourage you to take your nausea medication as directed.  BELOW ARE SYMPTOMS THAT SHOULD BE REPORTED IMMEDIATELY: *FEVER GREATER THAN 100.4 F (38 C) OR HIGHER *CHILLS OR SWEATING *NAUSEA AND VOMITING THAT IS NOT CONTROLLED WITH YOUR NAUSEA MEDICATION *UNUSUAL SHORTNESS OF BREATH *UNUSUAL BRUISING OR BLEEDING *URINARY PROBLEMS (pain or burning when urinating, or frequent urination) *BOWEL PROBLEMS (unusual diarrhea, constipation, pain near the anus) TENDERNESS IN MOUTH AND THROAT WITH OR WITHOUT PRESENCE OF ULCERS (sore throat, sores in mouth, or a toothache) UNUSUAL RASH, SWELLING OR PAIN  UNUSUAL VAGINAL DISCHARGE OR ITCHING   Items with * indicate a potential emergency and should be followed up as soon as possible or go to the Emergency Department if any problems should occur.  Please show the CHEMOTHERAPY ALERT CARD or IMMUNOTHERAPY ALERT CARD at check-in to the Emergency  Department and triage nurse.  Should you have questions after your visit or need to cancel or reschedule your appointment, please contact [facility] [phone]  and follow the prompts.  Office hours are 8:00 a.m. to 4:30 p.m. Monday - Friday. Please note that voicemails left after 4:00 p.m. may not be returned until the following business day.  We are closed weekends and major holidays. You have access to a nurse at all times for urgent questions. Please call the main number to the clinic [phone] and follow the prompts.  For any non-urgent questions, you may also contact your provider using MyChart. We now offer e-Visits for anyone 18 and older to request care online for non-urgent symptoms. For details visit [email or web address].   Also download the MyChart app! Go to the app store, search "MyChart", open the app, select [facility], and log in with your MyChart username and password.  Due to Covid, a mask is required upon entering the hospital/clinic. If you do not have a mask, one will be given to you upon arrival. For doctor visits, patients may have 1 support person aged 18 or older with them. For treatment visits, patients cannot have anyone with them due to current Covid guidelines and our immunocompromised population.  

## 2021-05-05 NOTE — Progress Notes (Signed)
Patient presents today for Retacrit injection. Hemoglobin reviewed prior to administration. VSS. Injection tolerated without incident or complaint. See MAR for details. Patient stable during and after injection.  Patient discharged in satisfactory condition with no s/s of distress noted.    

## 2021-05-05 NOTE — Patient Instructions (Signed)
[place] at [facility] Discharge Instructions  You were seen and examined by Dr. Delton Coombes. Lab work and possible Retacrit injections monthly. Return to clinic as scheduled in 3 months with labs, office visit, and injection.    Thank you for choosing [place] at [facility] to provide your oncology and hematology care.  To afford each patient quality time with our provider, please arrive at least 15 minutes before your scheduled appointment time.   If you have a lab appointment with the [facility] please come in thru the Main Entrance and check in at the main information desk.  You need to re-schedule your appointment should you arrive 10 or more minutes late.  We strive to give you quality time with our providers, and arriving late affects you and other patients whose appointments are after yours.  Also, if you no show three or more times for appointments you may be dismissed from the clinic at the providers discretion.     Again, thank you for choosing [facility].  Our hope is that these requests will decrease the amount of time that you wait before being seen by our physicians.       _____________________________________________________________  Should you have questions after your visit to [facility], please contact our office at [phone] and follow the prompts.  Our office hours are 8:00 a.m. and 4:30 p.m. Monday - Friday.  Please note that voicemails left after 4:00 p.m. may not be returned until the following business day.  We are closed weekends and major holidays.  You do have access to a nurse 24-7, just call the main number to the clinic [phone] and do not press any options, hold on the line and a nurse will answer the phone.    For prescription refill requests, have your pharmacy contact our office and allow 72 hours.    Due to Covid, you will need to wear a mask upon entering the hospital.  If you do not have a mask, a mask will be given to you at the Main Entrance upon arrival. For doctor visits, patients may have 1 support person age 93 or older with them. For treatment visits, patients can not have anyone with them due to social distancing guidelines and our immunocompromised population.

## 2021-05-07 ENCOUNTER — Inpatient Hospital Stay (HOSPITAL_COMMUNITY): Payer: Medicare HMO

## 2021-05-07 ENCOUNTER — Other Ambulatory Visit: Payer: Self-pay

## 2021-05-07 VITALS — BP 131/62 | HR 62 | Temp 96.9°F | Resp 18

## 2021-05-07 DIAGNOSIS — N189 Chronic kidney disease, unspecified: Secondary | ICD-10-CM | POA: Diagnosis not present

## 2021-05-07 DIAGNOSIS — R0602 Shortness of breath: Secondary | ICD-10-CM | POA: Diagnosis not present

## 2021-05-07 DIAGNOSIS — R748 Abnormal levels of other serum enzymes: Secondary | ICD-10-CM | POA: Diagnosis not present

## 2021-05-07 DIAGNOSIS — I2510 Atherosclerotic heart disease of native coronary artery without angina pectoris: Secondary | ICD-10-CM | POA: Diagnosis not present

## 2021-05-07 DIAGNOSIS — I4891 Unspecified atrial fibrillation: Secondary | ICD-10-CM | POA: Diagnosis not present

## 2021-05-07 DIAGNOSIS — E119 Type 2 diabetes mellitus without complications: Secondary | ICD-10-CM | POA: Diagnosis not present

## 2021-05-07 DIAGNOSIS — Z79899 Other long term (current) drug therapy: Secondary | ICD-10-CM | POA: Diagnosis not present

## 2021-05-07 DIAGNOSIS — I129 Hypertensive chronic kidney disease with stage 1 through stage 4 chronic kidney disease, or unspecified chronic kidney disease: Secondary | ICD-10-CM | POA: Diagnosis not present

## 2021-05-07 DIAGNOSIS — D539 Nutritional anemia, unspecified: Secondary | ICD-10-CM

## 2021-05-07 DIAGNOSIS — D508 Other iron deficiency anemias: Secondary | ICD-10-CM

## 2021-05-07 DIAGNOSIS — N179 Acute kidney failure, unspecified: Secondary | ICD-10-CM

## 2021-05-07 DIAGNOSIS — D469 Myelodysplastic syndrome, unspecified: Secondary | ICD-10-CM | POA: Diagnosis not present

## 2021-05-07 DIAGNOSIS — N183 Chronic kidney disease, stage 3 unspecified: Secondary | ICD-10-CM

## 2021-05-07 MED ORDER — LORATADINE 10 MG PO TABS
10.0000 mg | ORAL_TABLET | Freq: Once | ORAL | Status: AC
  Administered 2021-05-07: 10 mg via ORAL
  Filled 2021-05-07: qty 1

## 2021-05-07 MED ORDER — SODIUM CHLORIDE 0.9 % IV SOLN
400.0000 mg | Freq: Once | INTRAVENOUS | Status: AC
  Administered 2021-05-07: 400 mg via INTRAVENOUS
  Filled 2021-05-07: qty 20

## 2021-05-07 MED ORDER — ACETAMINOPHEN 325 MG PO TABS
650.0000 mg | ORAL_TABLET | Freq: Once | ORAL | Status: AC
  Administered 2021-05-07: 650 mg via ORAL
  Filled 2021-05-07: qty 2

## 2021-05-07 MED ORDER — SODIUM CHLORIDE 0.9 % IV SOLN: Freq: Once | INTRAVENOUS | Status: AC

## 2021-05-07 MED ORDER — FAMOTIDINE 20 MG PO TABS
20.0000 mg | ORAL_TABLET | Freq: Once | ORAL | Status: AC
  Administered 2021-05-07: 20 mg via ORAL
  Filled 2021-05-07: qty 1

## 2021-05-07 NOTE — Patient Instructions (Signed)
[facility]  Discharge Instructions: Thank you for choosing [facility] to provide your oncology and hematology care.  If you have a lab appointment with the Cancer Center, please come in thru the Main Entrance and check in at the main information desk.  Wear comfortable clothing and clothing appropriate for easy access to any Portacath or PICC line.   We strive to give you quality time with your provider. You may need to reschedule your appointment if you arrive late (15 or more minutes).  Arriving late affects you and other patients whose appointments are after yours.  Also, if you miss three or more appointments without notifying the office, you may be dismissed from the clinic at the provider's discretion.      For prescription refill requests, have your pharmacy contact our office and allow 72 hours for refills to be completed.    Today you received the following chemotherapy and/or immunotherapy agents Venofer      To help prevent nausea and vomiting after your treatment, we encourage you to take your nausea medication as directed.  BELOW ARE SYMPTOMS THAT SHOULD BE REPORTED IMMEDIATELY: *FEVER GREATER THAN 100.4 F (38 C) OR HIGHER *CHILLS OR SWEATING *NAUSEA AND VOMITING THAT IS NOT CONTROLLED WITH YOUR NAUSEA MEDICATION *UNUSUAL SHORTNESS OF BREATH *UNUSUAL BRUISING OR BLEEDING *URINARY PROBLEMS (pain or burning when urinating, or frequent urination) *BOWEL PROBLEMS (unusual diarrhea, constipation, pain near the anus) TENDERNESS IN MOUTH AND THROAT WITH OR WITHOUT PRESENCE OF ULCERS (sore throat, sores in mouth, or a toothache) UNUSUAL RASH, SWELLING OR PAIN  UNUSUAL VAGINAL DISCHARGE OR ITCHING   Items with * indicate a potential emergency and should be followed up as soon as possible or go to the Emergency Department if any problems should occur.  Please show the CHEMOTHERAPY ALERT CARD or IMMUNOTHERAPY ALERT CARD at check-in to the Emergency  Department and triage nurse.  Should you have questions after your visit or need to cancel or reschedule your appointment, please contact [facility] [phone]  and follow the prompts.  Office hours are 8:00 a.m. to 4:30 p.m. Monday - Friday. Please note that voicemails left after 4:00 p.m. may not be returned until the following business day.  We are closed weekends and major holidays. You have access to a nurse at all times for urgent questions. Please call the main number to the clinic [phone] and follow the prompts.  For any non-urgent questions, you may also contact your provider using MyChart. We now offer e-Visits for anyone 18 and older to request care online for non-urgent symptoms. For details visit [email or web address].   Also download the MyChart app! Go to the app store, search "MyChart", open the app, select [place], and log in with your MyChart username and password.  Due to Covid, a mask is required upon entering the hospital/clinic. If you do not have a mask, one will be given to you upon arrival. For doctor visits, patients may have 1 support person aged 18 or older with them. For treatment visits, patients cannot have anyone with them due to current Covid guidelines and our immunocompromised population.  

## 2021-05-07 NOTE — Progress Notes (Signed)
Patient presents today for Venofer per providers order.  Vital signs WNL.  Patient has no new complaints at this time.  Peripheral IV started and blood return noted pre and post infusion.  Venofer given today per MD orders.  Stable during infusion without adverse affects.  Vital signs stable.  No complaints at this time.  Discharge from clinic ambulatory in stable condition.  Alert and oriented X 3.  Follow up with [facility] as scheduled.  

## 2021-05-19 DIAGNOSIS — I129 Hypertensive chronic kidney disease with stage 1 through stage 4 chronic kidney disease, or unspecified chronic kidney disease: Secondary | ICD-10-CM | POA: Diagnosis not present

## 2021-05-19 DIAGNOSIS — N184 Chronic kidney disease, stage 4 (severe): Secondary | ICD-10-CM | POA: Diagnosis not present

## 2021-05-19 DIAGNOSIS — D631 Anemia in chronic kidney disease: Secondary | ICD-10-CM | POA: Diagnosis not present

## 2021-05-19 DIAGNOSIS — R21 Rash and other nonspecific skin eruption: Secondary | ICD-10-CM | POA: Diagnosis not present

## 2021-05-19 DIAGNOSIS — I5032 Chronic diastolic (congestive) heart failure: Secondary | ICD-10-CM | POA: Diagnosis not present

## 2021-06-02 ENCOUNTER — Inpatient Hospital Stay (HOSPITAL_COMMUNITY): Payer: Medicare HMO

## 2021-06-02 ENCOUNTER — Other Ambulatory Visit: Payer: Self-pay

## 2021-06-02 ENCOUNTER — Inpatient Hospital Stay (HOSPITAL_COMMUNITY): Payer: Medicare HMO | Attending: Hematology

## 2021-06-02 VITALS — BP 130/66 | HR 70 | Temp 98.1°F | Resp 18

## 2021-06-02 DIAGNOSIS — Z79899 Other long term (current) drug therapy: Secondary | ICD-10-CM | POA: Insufficient documentation

## 2021-06-02 DIAGNOSIS — D539 Nutritional anemia, unspecified: Secondary | ICD-10-CM

## 2021-06-02 DIAGNOSIS — D4620 Refractory anemia with excess of blasts, unspecified: Secondary | ICD-10-CM | POA: Diagnosis not present

## 2021-06-02 DIAGNOSIS — D508 Other iron deficiency anemias: Secondary | ICD-10-CM

## 2021-06-02 DIAGNOSIS — N183 Chronic kidney disease, stage 3 unspecified: Secondary | ICD-10-CM

## 2021-06-02 LAB — COMPREHENSIVE METABOLIC PANEL
ALT: 15 U/L (ref 0–44)
AST: 20 U/L (ref 15–41)
Albumin: 4 g/dL (ref 3.5–5.0)
Alkaline Phosphatase: 101 U/L (ref 38–126)
Anion gap: 10 (ref 5–15)
BUN: 27 mg/dL — ABNORMAL HIGH (ref 8–23)
CO2: 26 mmol/L (ref 22–32)
Calcium: 9.1 mg/dL (ref 8.9–10.3)
Chloride: 101 mmol/L (ref 98–111)
Creatinine, Ser: 1.75 mg/dL — ABNORMAL HIGH (ref 0.61–1.24)
GFR, Estimated: 40 mL/min — ABNORMAL LOW (ref >60)
Glucose, Bld: 337 mg/dL — ABNORMAL HIGH (ref 70–99)
Potassium: 3.7 mmol/L (ref 3.5–5.1)
Sodium: 137 mmol/L (ref 135–145)
Total Bilirubin: 1.4 mg/dL — ABNORMAL HIGH (ref 0.3–1.2)
Total Protein: 7 g/dL (ref 6.5–8.1)

## 2021-06-02 LAB — CBC WITH DIFFERENTIAL/PLATELET
Abs Immature Granulocytes: 0.06 10*3/uL (ref 0.00–0.07)
Basophils Absolute: 0 10*3/uL (ref 0.0–0.1)
Basophils Relative: 0 %
Eosinophils Absolute: 0 10*3/uL (ref 0.0–0.5)
Eosinophils Relative: 0 %
HCT: 30 % — ABNORMAL LOW (ref 39.0–52.0)
Hemoglobin: 10.2 g/dL — ABNORMAL LOW (ref 13.0–17.0)
Immature Granulocytes: 2 %
Lymphocytes Relative: 34 %
Lymphs Abs: 0.9 10*3/uL (ref 0.7–4.0)
MCH: 39.8 pg — ABNORMAL HIGH (ref 26.0–34.0)
MCHC: 34 g/dL (ref 30.0–36.0)
MCV: 117.2 fL — ABNORMAL HIGH (ref 80.0–100.0)
Monocytes Absolute: 0.9 10*3/uL (ref 0.1–1.0)
Monocytes Relative: 34 %
Neutro Abs: 0.8 10*3/uL — ABNORMAL LOW (ref 1.7–7.7)
Neutrophils Relative %: 30 %
Platelets: 104 10*3/uL — ABNORMAL LOW (ref 150–400)
RBC: 2.56 MIL/uL — ABNORMAL LOW (ref 4.22–5.81)
RDW: 16.3 % — ABNORMAL HIGH (ref 11.5–15.5)
WBC: 2.7 10*3/uL — ABNORMAL LOW (ref 4.0–10.5)
nRBC: 0 % (ref 0.0–0.2)

## 2021-06-02 LAB — IRON AND TIBC
Iron: 99 ug/dL (ref 45–182)
Saturation Ratios: 38 % (ref 17.9–39.5)
TIBC: 262 ug/dL (ref 250–450)
UIBC: 163 ug/dL

## 2021-06-02 LAB — FERRITIN: Ferritin: 444 ng/mL — ABNORMAL HIGH (ref 24–336)

## 2021-06-02 MED ORDER — EPOETIN ALFA-EPBX 20000 UNIT/ML IJ SOLN
20000.0000 [IU] | Freq: Once | INTRAMUSCULAR | Status: AC
  Administered 2021-06-02: 20000 [IU] via SUBCUTANEOUS
  Filled 2021-06-02: qty 1

## 2021-06-02 MED ORDER — EPOETIN ALFA-EPBX 10000 UNIT/ML IJ SOLN
10000.0000 [IU] | Freq: Once | INTRAMUSCULAR | Status: AC
  Administered 2021-06-02: 10000 [IU] via SUBCUTANEOUS
  Filled 2021-06-02: qty 1

## 2021-06-02 NOTE — Patient Instructions (Signed)
[facility]  Discharge Instructions: Thank you for choosing [facility] to provide your oncology and hematology care.  If you have a lab appointment with the Cancer Center, please come in thru the Main Entrance and check in at the main information desk.  Wear comfortable clothing and clothing appropriate for easy access to any Portacath or PICC line.   We strive to give you quality time with your provider. You may need to reschedule your appointment if you arrive late (15 or more minutes).  Arriving late affects you and other patients whose appointments are after yours.  Also, if you miss three or more appointments without notifying the office, you may be dismissed from the clinic at the provider's discretion.      For prescription refill requests, have your pharmacy contact our office and allow 72 hours for refills to be completed.    Today you received the following chemotherapy and/or immunotherapy agents Retacrit      To help prevent nausea and vomiting after your treatment, we encourage you to take your nausea medication as directed.  BELOW ARE SYMPTOMS THAT SHOULD BE REPORTED IMMEDIATELY: *FEVER GREATER THAN 100.4 F (38 C) OR HIGHER *CHILLS OR SWEATING *NAUSEA AND VOMITING THAT IS NOT CONTROLLED WITH YOUR NAUSEA MEDICATION *UNUSUAL SHORTNESS OF BREATH *UNUSUAL BRUISING OR BLEEDING *URINARY PROBLEMS (pain or burning when urinating, or frequent urination) *BOWEL PROBLEMS (unusual diarrhea, constipation, pain near the anus) TENDERNESS IN MOUTH AND THROAT WITH OR WITHOUT PRESENCE OF ULCERS (sore throat, sores in mouth, or a toothache) UNUSUAL RASH, SWELLING OR PAIN  UNUSUAL VAGINAL DISCHARGE OR ITCHING   Items with * indicate a potential emergency and should be followed up as soon as possible or go to the Emergency Department if any problems should occur.  Please show the CHEMOTHERAPY ALERT CARD or IMMUNOTHERAPY ALERT CARD at check-in to the Emergency  Department and triage nurse.  Should you have questions after your visit or need to cancel or reschedule your appointment, please contact [facility] [phone]  and follow the prompts.  Office hours are 8:00 a.m. to 4:30 p.m. Monday - Friday. Please note that voicemails left after 4:00 p.m. may not be returned until the following business day.  We are closed weekends and major holidays. You have access to a nurse at all times for urgent questions. Please call the main number to the clinic [phone] and follow the prompts.  For any non-urgent questions, you may also contact your provider using MyChart. We now offer e-Visits for anyone 18 and older to request care online for non-urgent symptoms. For details visit [email or web address].   Also download the MyChart app! Go to the app store, search "MyChart", open the app, select [place], and log in with your MyChart username and password.  Due to Covid, a mask is required upon entering the hospital/clinic. If you do not have a mask, one will be given to you upon arrival. For doctor visits, patients may have 1 support person aged 18 or older with them. For treatment visits, patients cannot have anyone with them due to current Covid guidelines and our immunocompromised population.  

## 2021-06-02 NOTE — Progress Notes (Signed)
Jack Mejia presents today for Retacrit injection per the provider's orders.  Stable during administration without incident; injection site WNL; see MAR for injection details.  Patient tolerated procedure well and without incident.  No questions or complaints noted at this time.  Discharge from clinic ambulatory in stable condition.  Alert and oriented X 3.  Follow up with [facility] as scheduled.

## 2021-06-03 DIAGNOSIS — L308 Other specified dermatitis: Secondary | ICD-10-CM | POA: Diagnosis not present

## 2021-06-03 DIAGNOSIS — B351 Tinea unguium: Secondary | ICD-10-CM | POA: Diagnosis not present

## 2021-06-03 DIAGNOSIS — I872 Venous insufficiency (chronic) (peripheral): Secondary | ICD-10-CM | POA: Diagnosis not present

## 2021-06-06 ENCOUNTER — Other Ambulatory Visit: Payer: Self-pay | Admitting: Family Medicine

## 2021-06-25 ENCOUNTER — Other Ambulatory Visit: Payer: Self-pay | Admitting: *Deleted

## 2021-06-25 DIAGNOSIS — E1122 Type 2 diabetes mellitus with diabetic chronic kidney disease: Secondary | ICD-10-CM

## 2021-06-25 DIAGNOSIS — N1832 Chronic kidney disease, stage 3b: Secondary | ICD-10-CM

## 2021-06-25 MED ORDER — EMPAGLIFLOZIN 10 MG PO TABS: 10.0000 mg | ORAL_TABLET | Freq: Every day | ORAL | 0 refills | Status: DC

## 2021-06-30 ENCOUNTER — Ambulatory Visit: Payer: Medicare HMO | Admitting: Cardiology

## 2021-06-30 ENCOUNTER — Other Ambulatory Visit: Payer: Self-pay | Admitting: Nurse Practitioner

## 2021-06-30 ENCOUNTER — Other Ambulatory Visit: Payer: Self-pay | Admitting: Family Medicine

## 2021-06-30 DIAGNOSIS — M1A0620 Idiopathic chronic gout, left knee, without tophus (tophi): Secondary | ICD-10-CM

## 2021-07-01 ENCOUNTER — Inpatient Hospital Stay (HOSPITAL_COMMUNITY): Payer: Medicare HMO

## 2021-07-01 ENCOUNTER — Other Ambulatory Visit: Payer: Self-pay

## 2021-07-01 ENCOUNTER — Inpatient Hospital Stay (HOSPITAL_COMMUNITY): Payer: Medicare HMO | Attending: Hematology

## 2021-07-01 ENCOUNTER — Other Ambulatory Visit (HOSPITAL_COMMUNITY): Payer: Self-pay | Admitting: Physician Assistant

## 2021-07-01 ENCOUNTER — Encounter (HOSPITAL_COMMUNITY): Payer: Self-pay

## 2021-07-01 VITALS — BP 126/59 | HR 65 | Temp 96.8°F | Resp 18 | Ht 69.0 in | Wt 190.6 lb

## 2021-07-01 DIAGNOSIS — D539 Nutritional anemia, unspecified: Secondary | ICD-10-CM

## 2021-07-01 DIAGNOSIS — N179 Acute kidney failure, unspecified: Secondary | ICD-10-CM

## 2021-07-01 DIAGNOSIS — N183 Chronic kidney disease, stage 3 unspecified: Secondary | ICD-10-CM | POA: Insufficient documentation

## 2021-07-01 DIAGNOSIS — Z79899 Other long term (current) drug therapy: Secondary | ICD-10-CM | POA: Insufficient documentation

## 2021-07-01 DIAGNOSIS — D631 Anemia in chronic kidney disease: Secondary | ICD-10-CM | POA: Insufficient documentation

## 2021-07-01 DIAGNOSIS — I129 Hypertensive chronic kidney disease with stage 1 through stage 4 chronic kidney disease, or unspecified chronic kidney disease: Secondary | ICD-10-CM | POA: Insufficient documentation

## 2021-07-01 DIAGNOSIS — D508 Other iron deficiency anemias: Secondary | ICD-10-CM

## 2021-07-01 LAB — CBC
HCT: 28.5 % — ABNORMAL LOW (ref 39.0–52.0)
Hemoglobin: 9.6 g/dL — ABNORMAL LOW (ref 13.0–17.0)
MCH: 39.5 pg — ABNORMAL HIGH (ref 26.0–34.0)
MCHC: 33.7 g/dL (ref 30.0–36.0)
MCV: 117.3 fL — ABNORMAL HIGH (ref 80.0–100.0)
Platelets: 125 10*3/uL — ABNORMAL LOW (ref 150–400)
RBC: 2.43 MIL/uL — ABNORMAL LOW (ref 4.22–5.81)
RDW: 15.7 % — ABNORMAL HIGH (ref 11.5–15.5)
WBC: 3.1 10*3/uL — ABNORMAL LOW (ref 4.0–10.5)
nRBC: 0 % (ref 0.0–0.2)

## 2021-07-01 MED ORDER — EPOETIN ALFA-EPBX 10000 UNIT/ML IJ SOLN
10000.0000 [IU] | Freq: Once | INTRAMUSCULAR | Status: AC
  Administered 2021-07-01: 10000 [IU] via SUBCUTANEOUS
  Filled 2021-07-01: qty 1

## 2021-07-01 MED ORDER — EPOETIN ALFA-EPBX 20000 UNIT/ML IJ SOLN
20000.0000 [IU] | Freq: Once | INTRAMUSCULAR | Status: AC
  Administered 2021-07-01: 20000 [IU] via SUBCUTANEOUS
  Filled 2021-07-01: qty 1

## 2021-07-01 NOTE — Patient Instructions (Signed)
[facility]  Discharge Instructions: Thank you for choosing [facility] to provide your oncology and hematology care.  If you have a lab appointment with the [facility], please come in thru the Main Entrance and check in at the main information desk.  Wear comfortable clothing and clothing appropriate for easy access to any Portacath or PICC line.   We strive to give you quality time with your provider. You may need to reschedule your appointment if you arrive late (15 or more minutes).  Arriving late affects you and other patients whose appointments are after yours.  Also, if you miss three or more appointments without notifying the office, you may be dismissed from the clinic at the provider's discretion.      For prescription refill requests, have your pharmacy contact our office and allow 72 hours for refills to be completed.    Today you received the following 30,000 units of Retacrit, return as scheduled.   To help prevent nausea and vomiting after your treatment, we encourage you to take your nausea medication as directed.  BELOW ARE SYMPTOMS THAT SHOULD BE REPORTED IMMEDIATELY: *FEVER GREATER THAN 100.4 F (38 C) OR HIGHER *CHILLS OR SWEATING *NAUSEA AND VOMITING THAT IS NOT CONTROLLED WITH YOUR NAUSEA MEDICATION *UNUSUAL SHORTNESS OF BREATH *UNUSUAL BRUISING OR BLEEDING *URINARY PROBLEMS (pain or burning when urinating, or frequent urination) *BOWEL PROBLEMS (unusual diarrhea, constipation, pain near the anus) TENDERNESS IN MOUTH AND THROAT WITH OR WITHOUT PRESENCE OF ULCERS (sore throat, sores in mouth, or a toothache) UNUSUAL RASH, SWELLING OR PAIN  UNUSUAL VAGINAL DISCHARGE OR ITCHING   Items with * indicate a potential emergency and should be followed up as soon as possible or go to the Emergency Department if any problems should occur.  Please show the CHEMOTHERAPY ALERT CARD or IMMUNOTHERAPY ALERT CARD at check-in to the Emergency Department  and triage nurse.  Should you have questions after your visit or need to cancel or reschedule your appointment, please contact [facility] [phone]  and follow the prompts.  Office hours are 8:00 a.m. to 4:30 p.m. Monday - Friday. Please note that voicemails left after 4:00 p.m. may not be returned until the following business day.  We are closed weekends and major holidays. You have access to a nurse at all times for urgent questions. Please call the main number to the clinic [phone] and follow the prompts.  For any non-urgent questions, you may also contact your provider using MyChart. We now offer e-Visits for anyone 25 and older to request care online for non-urgent symptoms. For details visit [email or web address].   Also download the MyChart app! Go to the app store, search "MyChart", open the app, select [place], and log in with your MyChart username and password.  Due to Covid, a mask is required upon entering the hospital/clinic. If you do not have a mask, one will be given to you upon arrival. For doctor visits, patients may have 1 support person aged 50 or older with them. For treatment visits, patients cannot have anyone with them due to current Covid guidelines and our immunocompromised population.

## 2021-07-01 NOTE — Progress Notes (Signed)
Patient presents today for Retacrit injection. Hemoglobin reviewed prior to administration. VSS tolerated without incident or complaint. See MAR for details. Patient stable during and after injection. Patient expressed concerns for retacrit not working, Received orders for patient to receive 40,000 units at next visit per Tarri Abernethy, [place]. Pharmacy aware. Patient declined AVS. Patient discharged in satisfactory condition with no s/s of distress noted.

## 2021-07-08 ENCOUNTER — Other Ambulatory Visit: Payer: Self-pay | Admitting: Family Medicine

## 2021-07-08 ENCOUNTER — Telehealth: Payer: Self-pay | Admitting: Family Medicine

## 2021-07-08 DIAGNOSIS — I129 Hypertensive chronic kidney disease with stage 1 through stage 4 chronic kidney disease, or unspecified chronic kidney disease: Secondary | ICD-10-CM

## 2021-07-08 DIAGNOSIS — E1122 Type 2 diabetes mellitus with diabetic chronic kidney disease: Secondary | ICD-10-CM

## 2021-07-08 DIAGNOSIS — N1832 Chronic kidney disease, stage 3b: Secondary | ICD-10-CM

## 2021-07-08 MED ORDER — LEVEMIR FLEXTOUCH 100 UNIT/ML ~~LOC~~ SOPN: 24.0000 [IU] | PEN_INJECTOR | Freq: Every day | SUBCUTANEOUS | 1 refills | Status: AC

## 2021-07-08 NOTE — Telephone Encounter (Signed)
Patient is requesting refill on levemir called into [facility] he has appointment on 12/16 but completely out of medication

## 2021-07-08 NOTE — Telephone Encounter (Signed)
Levemir sent to pharmacy. Left message to return call

## 2021-07-10 NOTE — Telephone Encounter (Signed)
Patient has picked up levimir per wife

## 2021-07-11 ENCOUNTER — Encounter: Payer: Self-pay | Admitting: Family Medicine

## 2021-07-11 ENCOUNTER — Other Ambulatory Visit: Payer: Self-pay

## 2021-07-11 ENCOUNTER — Ambulatory Visit (INDEPENDENT_AMBULATORY_CARE_PROVIDER_SITE_OTHER): Payer: Medicare HMO | Admitting: Family Medicine

## 2021-07-11 VITALS — BP 128/64 | HR 83 | Temp 97.9°F | Ht 69.0 in | Wt 189.4 lb

## 2021-07-11 DIAGNOSIS — E1122 Type 2 diabetes mellitus with diabetic chronic kidney disease: Secondary | ICD-10-CM | POA: Diagnosis not present

## 2021-07-11 DIAGNOSIS — N1832 Chronic kidney disease, stage 3b: Secondary | ICD-10-CM | POA: Diagnosis not present

## 2021-07-11 DIAGNOSIS — I129 Hypertensive chronic kidney disease with stage 1 through stage 4 chronic kidney disease, or unspecified chronic kidney disease: Secondary | ICD-10-CM | POA: Diagnosis not present

## 2021-07-11 DIAGNOSIS — R0602 Shortness of breath: Secondary | ICD-10-CM

## 2021-07-11 DIAGNOSIS — I10 Essential (primary) hypertension: Secondary | ICD-10-CM | POA: Diagnosis not present

## 2021-07-11 NOTE — Patient Instructions (Addendum)
Continue your current medications.  No changes at this time.  Follow up in 6 months.  Take care  Dr. Lacinda Axon

## 2021-07-13 DIAGNOSIS — R0602 Shortness of breath: Secondary | ICD-10-CM | POA: Insufficient documentation

## 2021-07-13 NOTE — Assessment & Plan Note (Signed)
Lengthy discussion with patient today. 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 Advised to stay active. Will continue to monitor closely. Not sure what else can be done.

## 2021-07-13 NOTE — Assessment & Plan Note (Signed)
Stable currently. Continue Jardiance and current insulin dosing.

## 2021-07-13 NOTE — Assessment & Plan Note (Signed)
Stable. Continue Metoprolol, Torsemide, Diltiazem.

## 2021-07-13 NOTE — Progress Notes (Signed)
Subjective:  Patient ID: Jack Mejia, male    DOB: 1946/04/14  Age: 75 y.o. MRN: [id number]  CC: Chief Complaint  Patient presents with   Establish Care    HPI:  75 year old male with an extensive PMH presents for follow up and to establish care with me.  Patient states that he has significant SOB with exertion. Chronic. Seems to be worsening. It is very frustrating for him. Has seen Pulmonology in the past during and after admission 05/2020. Has likely had amiodarone toxicity/lung injury. Was cleared from Pulmonary perspective in August.  Has CKD and associated anemia. Getting EPO and follows with nephrology.  Hypertension is well controlled. Currently on Diltiazem, Metoprolol, Torsemide.  Diabetes is well controlled on Insulin and Jardiance. Will need to stop Jardiance if renal function declines further.  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   Hyperlipidemia associated with type 2 diabetes mellitus ([place]) 07/11/2020   Type 2 diabetes mellitus with stage 3b chronic kidney disease and hypertension ([place]) 99/83/3825   Diastolic CHF ([place]) 05/39/7673   PAF (paroxysmal atrial fibrillation) ([place]) 06/06/2020   Anemia of chronic disease 01/01/2020   Macrocytic anemia 12/14/2019   Coronary artery disease involving native coronary artery of native heart without angina pectoris    CKD (chronic kidney disease) stage 3, GFR 30-59 ml/min (H[facility]) 04/21/2019   Depression, major, single episode, mild ([place]) 03/01/2017   Erectile dysfunction 12/05/2012   Essential hypertension 07/07/2011   Fatty liver 07/07/2011    Social Hx   Social History   Socioeconomic History   Marital status: Divorced    Spouse name: Not on file   Number of children: 2   Years of education: Not on file   Highest education level: Not on file  Occupational History   Occupation: RETIRED  Tobacco Use   Smoking  status: Former    Packs/day: 4.00    Years: 20.00    Pack years: 80.00    Types: Cigarettes    Quit date: 10/14/1982    Years since quitting: 38.7   Smokeless tobacco: Never   Tobacco comments:    29 yrs ago  Vaping Use   Vaping Use: Never used  Substance and Sexual Activity   Alcohol use: No   Drug use: No   Sexual activity: Not Currently    Birth control/protection: None  Other Topics Concern   Not on file  Social History Narrative   Not on file   Social Determinants of Health   Financial Resource Strain: Medium Risk   Difficulty of Paying Living Expenses: Somewhat hard  Food Insecurity: No Food Insecurity   Worried About Charity fundraiser in the Last Year: Never true   Ran Out of Food in the Last Year: Never true  Transportation Needs: No Transportation Needs   Lack of Transportation (Medical): No   Lack of Transportation (Non-Medical): No  Physical Activity: Insufficiently Active   Days of Exercise per Week: 4 days   Minutes of Exercise per Session: 10 min  Stress: No Stress Concern Present   Feeling of Stress : Not at all  Social Connections: Moderately Isolated   Frequency of Communication with Friends and Family: More than three times a week   Frequency of Social Gatherings with Friends and Family: More than three times a week   Attends Religious Services: More than 4 times per year   Active Member  of Clubs or Organizations: No   Attends Archivist Meetings: Never   Marital Status: Separated    Review of Systems Per HPI  Objective:  BP 128/64    Pulse 83    Temp 97.9 F (36.6 C)    Ht 5\' 9"  (1.753 m)    Wt 189 lb 6.4 oz (85.9 kg)    SpO2 98%    BMI 27.97 kg/m   BP/Weight 07/11/2021 07/01/2021 08/02/5535  Systolic BP 482 707 867  Diastolic BP 64 59 66  Wt. (Lbs) 189.4 190.6 -  BMI 27.97 28.15 -    Physical Exam Constitutional:      General: He is not in acute distress.    Appearance: Normal appearance. He is not ill-appearing.  HENT:      Head: Normocephalic and atraumatic.  Cardiovascular:     Rate and Rhythm: Normal rate.  Pulmonary:     Effort: Pulmonary effort is normal.     Breath sounds: Normal breath sounds.  Neurological:     Mental Status: He is alert.  Psychiatric:        Mood and Affect: Mood normal.        Behavior: Behavior normal.    Lab Results  Component Value Date   WBC 3.1 (L) 07/01/2021   HGB 9.6 (L) 07/01/2021   HCT 28.5 (L) 07/01/2021   PLT 125 (L) 07/01/2021   GLUCOSE 337 (H) 06/02/2021   CHOL 173 05/04/2018   TRIG 278 (H) 05/04/2018   HDL 34 (L) 05/04/2018   LDLCALC 83 05/04/2018   ALT 15 06/02/2021   AST 20 06/02/2021   NA 137 06/02/2021   K 3.7 06/02/2021   CL 101 06/02/2021   CREATININE 1.75 (H) 06/02/2021   BUN 27 (H) 06/02/2021   CO2 26 06/02/2021   TSH 0.490 06/06/2020   PSA 0.44 02/21/2014   INR 2.7 (H) 06/24/2020   HGBA1C 6.6 (H) 02/27/2021   MICROALBUR 10.08 (H) 02/21/2014     Assessment & Plan:   Problem List Items Addressed This Visit       Cardiovascular and Mediastinum   Essential hypertension - Primary    Stable. Continue Metoprolol, Torsemide, Diltiazem.      Type 2 diabetes mellitus with stage 3b chronic kidney disease and hypertension ([place])    Stable currently. Continue Jardiance and current insulin dosing.        Other   SOB (shortness of breath)    Lengthy discussion with patient today. 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 Advised to stay active. Will continue to monitor closely. Not sure what else can be done.       Follow-up:  Return in about 3 months (around 10/09/2021).  [place]

## 2021-07-29 ENCOUNTER — Ambulatory Visit (HOSPITAL_COMMUNITY): Payer: Medicare HMO | Admitting: Hematology

## 2021-07-29 ENCOUNTER — Other Ambulatory Visit (HOSPITAL_COMMUNITY): Payer: Medicare HMO

## 2021-07-29 ENCOUNTER — Ambulatory Visit (HOSPITAL_COMMUNITY): Payer: Medicare HMO

## 2021-07-29 NOTE — Progress Notes (Addendum)
[place], [place] [address]   CLINIC:  Medical Oncology/Hematology  PCP:  Coral Spikes, DO [place] [address] [phone]   REASON FOR VISIT:  Follow-up for low risk MDS and iron deficiency anemia  CURRENT THERAPY: Intermittent IV iron infusions, oral iron supplementation, Retacrit  INTERVAL HISTORY:  [facility] 76 y.o. male returns for routine follow-up of his MDS and iron deficiency.  He was last seen by Dr. Delton Coombes on 05/05/2021.  The patient's friend/caregiver is present during exam.  At today's visit, he reports feeling fair, apart from significant fatigue.  No recent hospitalizations, surgeries, or changes in baseline health status.  Labs today show marked decrease in blood counts (Hgb 7.6).  Patient reports that "he has been feeling worse ever since they restarted his Eliquis in August."  He reports black bowel movements "every now and then," which he relates to what he eats, although he is unable to provide specific foods that seem to trigger these episodes.  He denies any bright red blood per rectum.  He has occasional scant epistaxis (blood in tissue when he blows his nose, but no gross nasal hemorrhage).  He continues to take his iron tablets every other day.  He is symptomatic with significant fatigue, which has been ongoing for the past 4 years and progressive over the past month.  He also has chronic and progressive dyspnea on exertion.  He denies any chest pain or syncopal episodes.  No B symptoms such as fever, chills, night sweats, unintentional weight loss.  He denies any frequent infections.  He has 40% energy and 60% appetite. He endorses that he is maintaining a stable weight.   REVIEW OF SYSTEMS:  Review of Systems  Constitutional:  Positive for fatigue. Negative for appetite change, chills, diaphoresis, fever and unexpected weight change.  HENT:   Positive for nosebleeds (mild). Negative for  lump/mass.   Eyes:  Negative for eye problems.  Respiratory:  Positive for shortness of breath (with exertion). Negative for cough and hemoptysis.   Cardiovascular:  Negative for chest pain, leg swelling and palpitations.  Gastrointestinal:  Positive for nausea and vomiting. Negative for abdominal pain, blood in stool, constipation and diarrhea.  Genitourinary:  Positive for frequency. Negative for hematuria.   Skin: Negative.   Neurological:  Positive for headaches and numbness (tingling in fingers). Negative for dizziness and light-headedness.  Hematological:  Does not bruise/bleed easily.  Psychiatric/Behavioral:  Positive for depression and sleep disturbance.      PAST MEDICAL/SURGICAL HISTORY:  Past Medical History:  Diagnosis Date   Anxiety    Arthritis    Asthma    Atrial fibrillation ([facility])    a. s/p DCCV in 03/2019   CAD (coronary artery disease) 06/29/2019   Cramps of left lower extremity    Depression    Diabetes mellitus    type 2 for 7-8 yrs   Dyspnea    Elevated liver enzymes    Fatty liver    Gout    History of kidney stones    Hyperlipidemia    Hypertension    Renal insufficiency    Vertigo    Past Surgical History:  Procedure Laterality Date   BIOPSY  10/11/2020   Procedure: BIOPSY;  Surgeon: Harvel Quale, MD;  [facility];  Service: Gastroenterology;;   CARDIOVERSION N/A 04/21/2019   Procedure: CARDIOVERSION;  Surgeon: Sanda Klein, MD;  Location: [facility];  Service: Cardiovascular;  Laterality: N/A;   CARPAL TUNNEL RELEASE     both wrist   cataract surgery     bilateral   CHOLECYSTECTOMY     COLONOSCOPY  2011   COLONOSCOPY WITH PROPOFOL N/A 10/11/2020   Procedure: COLONOSCOPY WITH PROPOFOL;  Surgeon: Harvel Quale, MD;  [facility];  Service: Gastroenterology;  Laterality: N/A;  AM   ESOPHAGOGASTRODUODENOSCOPY (EGD) WITH PROPOFOL N/A 10/11/2020   Procedure: ESOPHAGOGASTRODUODENOSCOPY (EGD) WITH  PROPOFOL;  Surgeon: Harvel Quale, MD;  [facility];  Service: Gastroenterology;  Laterality: N/A;   EYE SURGERY     HEMORROIDECTOMY     KNEE SURGERY Left    LEFT HEART CATH AND CORONARY ANGIOGRAPHY N/A 05/23/2019   Procedure: LEFT HEART CATH AND CORONARY ANGIOGRAPHY;  Surgeon: Belva Crome, MD;  Location: [facility];  Service: Cardiovascular;  Laterality: N/A;   LUMBAR LAMINECTOMY/DECOMPRESSION MICRODISCECTOMY N/A 08/06/2016   Procedure: LUMBAR THREE- LUMBAR FIVE  DECOMPRESSIVE LUMBAR LAMINECTOMY;  Surgeon: Jovita Gamma, MD;  Location: [facility];  Service: Neurosurgery;  Laterality: N/A;   POLYPECTOMY  10/11/2020   Procedure: POLYPECTOMY;  Surgeon: Montez Morita, Quillian Quince, MD;  [facility];  Service: Gastroenterology;;     SOCIAL HISTORY:  Social History   Socioeconomic History   Marital status: Divorced    Spouse name: Not on file   Number of children: 2   Years of education: Not on file   Highest education level: Not on file  Occupational History   Occupation: RETIRED  Tobacco Use   Smoking status: Former    Packs/day: 4.00    Years: 20.00    Pack years: 80.00    Types: Cigarettes    Quit date: 10/14/1982    Years since quitting: 38.8   Smokeless tobacco: Never   Tobacco comments:    29 yrs ago  Vaping Use   Vaping Use: Never used  Substance and Sexual Activity   Alcohol use: No   Drug use: No   Sexual activity: Not Currently    Birth control/protection: None  Other Topics Concern   Not on file  Social History Narrative   Not on file   Social Determinants of Health   Financial Resource Strain: Medium Risk   Difficulty of Paying Living Expenses: Somewhat hard  Food Insecurity: No Food Insecurity   Worried About Charity fundraiser in the Last Year: Never true   Ran Out of Food in the Last Year: Never true  Transportation Needs: No Transportation Needs   Lack of Transportation (Medical): No   Lack of Transportation  (Non-Medical): No  Physical Activity: Insufficiently Active   Days of Exercise per Week: 4 days   Minutes of Exercise per Session: 10 min  Stress: No Stress Concern Present   Feeling of Stress : Not at all  Social Connections: Moderately Isolated   Frequency of Communication with Friends and Family: More than three times a week   Frequency of Social Gatherings with Friends and Family: More than three times a week   Attends Religious Services: More than 4 times per year   Active Member of [employer] or Organizations: No   Attends Archivist Meetings: Never   Marital Status: Separated  Intimate Partner Violence: Not At Risk   Fear of Current or Ex-Partner: No   Emotionally Abused: No   Physically Abused: No   Sexually Abused: No    FAMILY HISTORY:  Family History  Problem Relation Age of Onset  Diabetes Mother    Cancer Mother        unknown kind   Diabetes Sister    COPD Sister    Liver disease Brother    COPD Brother    Diabetes Maternal Grandmother    Diabetes Brother    COPD Sister    Healthy Daughter    Healthy Daughter    Colon cancer Neg Hx     CURRENT MEDICATIONS:  Outpatient Encounter Medications as of 07/30/2021  Medication Sig Note   acetaminophen (TYLENOL) 650 MG CR tablet Take 650 mg by mouth every 8 (eight) hours as needed for pain.    allopurinol (ZYLOPRIM) 300 MG tablet TAKE 1 TABLET (300 MG TOTAL) BY MOUTH DAILY. START THIS AFTER GOUT FLARE.    apixaban (ELIQUIS) 5 MG TABS tablet Take 1 tablet (5 mg total) by mouth 2 (two) times daily. 04/15/2021: One time per day    atorvastatin (LIPITOR) 20 MG tablet TAKE 1 TABLET BY MOUTH EVERY DAY    augmented betamethasone dipropionate (DIPROLENE-AF) 0.05 % cream Apply topically.    Balsam [place]-Castor Oil (VENELEX) OINT Apply 1 application topically 3 (three) times daily as needed (irritation). Apply to sacrum and bilateral buttocks    BD PEN NEEDLE NANO 2ND GEN 32G X 4 MM MISC Use with insulin pen qid.     clotrimazole-betamethasone (LOTRISONE) cream Apply 1 application topically 2 (two) times daily. (Patient taking differently: Apply 1 application topically 2 (two) times daily as needed (irritation).)    colchicine 0.6 MG tablet TAKE 1 TABLET BY MOUTH EVERY DAY AS NEEDED FOR GOUT FLARE    diltiazem (CARDIZEM CD) 360 MG 24 hr capsule TAKE 1 CAPSULE BY MOUTH EVERY DAY    empagliflozin (JARDIANCE) 10 MG TABS tablet Take 1 tablet (10 mg total) by mouth daily before breakfast.    ferrous sulfate 325 (65 FE) MG tablet Take 325 mg by mouth. Takes one every other day    LEVEMIR FLEXTOUCH 100 UNIT/ML FlexTouch Pen Inject 24 Units into the skin at bedtime.    metoprolol tartrate (LOPRESSOR) 25 MG tablet Take 1 tablet (25 mg total) by mouth 2 (two) times daily.    NOVOLOG FLEXPEN 100 UNIT/ML FlexPen INJECT 15 UNITS INTO THE SKIN 3 (THREE) TIMES DAILY WITH MEALS    ONETOUCH VERIO test strip TEST TWICE DAILY AS DIRECTED    potassium chloride (KLOR-CON) 10 MEQ tablet TAKE 1 TABLET BY MOUTH EVERY DAY    sertraline (ZOLOFT) 100 MG tablet Take 1 tab p.o. qd    torsemide (DEMADEX) 20 MG tablet TAKE 1 TABLET BY MOUTH TWICE A DAY    traMADol (ULTRAM) 50 MG tablet Take 1-2 tab p.o. q8hrs prn pain    No facility-administered encounter medications on file as of 07/30/2021.    ALLERGIES:  Allergies  Allergen Reactions   Demerol Nausea And Vomiting   Meperidine Hcl Nausea And Vomiting   Vasotec [Enalapril] Cough   Lasix [Furosemide] Rash     PHYSICAL EXAM:  ECOG PERFORMANCE STATUS: 2 - Symptomatic, <50% confined to bed  There were no vitals filed for this visit. There were no vitals filed for this visit. Physical Exam Vitals reviewed.  Constitutional:      Appearance: Normal appearance.     Comments: Somewhat tired-appearing  Cardiovascular:     Rate and Rhythm: Normal rate and regular rhythm.     Pulses: Normal pulses.     Heart sounds: Normal heart sounds.  Pulmonary:     Effort: Pulmonary effort  is  normal.     Breath sounds: Normal breath sounds.  Neurological:     General: No focal deficit present.     Mental Status: He is alert and oriented to person, place, and time.  Psychiatric:        Mood and Affect: Mood normal.        Behavior: Behavior normal.     LABORATORY DATA:  I have reviewed the labs as listed.  CBC    Component Value Date/Time   WBC 3.1 (L) 07/01/2021 0910   RBC 2.43 (L) 07/01/2021 0910   HGB 9.6 (L) 07/01/2021 0910   HGB 11.9 (L) 09/07/2018 1638   HCT 28.5 (L) 07/01/2021 0910   HCT 34.2 (L) 09/07/2018 1638   PLT 125 (L) 07/01/2021 0910   PLT 229 09/07/2018 1638   MCV 117.3 (H) 07/01/2021 0910   MCV 101 (H) 09/07/2018 1638   MCH 39.5 (H) 07/01/2021 0910   MCHC 33.7 07/01/2021 0910   RDW 15.7 (H) 07/01/2021 0910   RDW 12.8 09/07/2018 1638   LYMPHSABS 0.9 06/02/2021 0948   LYMPHSABS 1.3 09/07/2018 1638   MONOABS 0.9 06/02/2021 0948   EOSABS 0.0 06/02/2021 0948   EOSABS 0.2 09/07/2018 1638   BASOSABS 0.0 06/02/2021 0948   BASOSABS 0.1 09/07/2018 1638   CMP Latest Ref Rng & Units 06/02/2021 05/05/2021 02/03/2021  Glucose 70 - 99 mg/dL 337(H) 322(H) 194(H)  BUN 8 - 23 mg/dL 27(H) 24(H) 32(H)  Creatinine 0.61 - 1.24 mg/dL 1.75(H) 1.80(H) 1.85(H)  Sodium 135 - 145 mmol/L 137 136 138  Potassium 3.5 - 5.1 mmol/L 3.7 3.7 4.0  Chloride 98 - 111 mmol/L 101 101 98  CO2 22 - 32 mmol/L _0 Calcium 8.9 - 10.3 mg/dL 9.1 8.8(L) 9.3  Total Protein 6.5 - 8.1 g/dL 7.0 7.1 7.3  Total Bilirubin 0.3 - 1.2 mg/dL 1.4(H) 1.0 1.0  Alkaline Phos 38 - 126 U/L 101 117 128(H)  AST 15 - 41 U/L _1 ALT 0 - 44 U/L _2 DIAGNOSTIC IMAGING:  I have independently reviewed the relevant imaging and discussed with the patient.  ASSESSMENT & PLAN: 1.  Low risk MDS - Patient seen at the request of Dr. Wolfgang Phoenix for macrocytic anemia.  Macrocytosis noted since February 2020, gradually worsening. - CTAP (renal stone study) on 08/29/2020 was negative for  splenomegaly. - Serum erythropoietin was 77.2. - SPEP was negative.  Other nutritional deficiencies were ruled out. - Bone marrow biopsy on 03/29/2020 shows hypercellular marrow with trilineage dysplasia.  Blasts are 2%.  Chromosome analysis and FISH panel were normal. - Retacrit 30,000 units weekly started on 04/08/2020 - held in February 2022 and restarted in October 2022 - Last Retacrit was on 07/01/2021 at 30,000 units. - Received IV Venofer 400 mg on 05/07/2021 for treatment of iron deficiency (see below) - Labs today (07/30/2021): Hgb is 7.6/MCV 118.4, WBC 3.7 with ANC 1.6 and mild monocytosis 1.2, platelets 207.  Iron panel with ferritin 632, serum iron 220, and iron saturation 81%.  Normal folate, B12. - He is symptomatic with worsening fatigue and dyspnea on exertion.  He denies any B symptoms, bleeding events, or frequent infections. - Anemia of MDS is compounded by iron deficiency, addressed below - PLAN: Differential diagnosis for worsening anemia favors progressive MDS versus blood loss in the setting of Eliquis.  However, patient does not have any iron deficiency. - Increase Retacrit to 40,000 units every 2 weeks.  CBC with sample to blood bank every 2 weeks. - We will check stool cards x3. - Follow-up visit in 1 month. - Treatment and monitoring of iron deficiency, as below  2.  Iron deficiency - Received Venofer x 5 doses, completed on 01/24/2020 - Received Venofer 400 mg x 1 dose on 05/07/2021 due to downtrending hemoglobin and concern for functional iron deficiency.  (At that time, labs showed ferritin 323, iron saturation 23%, Hgb 10.3) - No significant improvement in hemoglobin after IV Venofer - EGD (10/11/2020): Normal esophagus, stomach, duodenum - Colonoscopy (10/11/2020): Polyp x4, otherwise normal colon.  Per note by Dr. Jenetta Downer (02/13/2021), rectal bleeding was thought to be related to inflammatory polyp in rectum. - He is on Eliquis for atrial fibrillation and has occasional  bleeding on tissue with bowel movements.  He reports dark stool "every now and then" - His hemoglobin remained between 11 and 12 when he was off of Eliquis. - Eliquis was again started back in August 2022.  He had COVID in September. - He is taking iron tablet every other day. - No gross GI hemorrhage, but he has dark stools "every now and then" - Labs today show worsening anemia with Hgb 7.2, but elevated ferritin 632, serum iron 220, and iron saturation 81% - PLAN: No indication for IV iron.  Continue oral iron supplementation every other day.   - We will recheck iron panel in 3 months.  3.  CKD stage IIIb: - History of CKD since 2017. - Renal ultrasound on 04/21/2019 shows bilateral renal cysts. - Baseline creatinine around 1.7-1.9 - CMP today (07/30/2021): Creatinine 2.12, somewhat worse than baseline with GFR 32 - PLAN: Continue follow-up with PCP and nephrologist.    4.  Atrial fibrillation, on Eliquis: - Eliquis was started back sometime in August 2022 - He reported occasional scant nosebleeds.  Denies any frank bleeding per rectum or melena, but does have occasional dark bowel movements. - He follows with cardiology - PLAN: Continue Eliquis as per cardiology and GI.  Patient is aware of alarm signs or symptoms of bleeding events that would prompt immediate medical attention.  5.  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  Amiodarone was discontinued. - Was cleared by pulmonology and is being followed by PCP - Per PCP note (07/11/2021), DOE is multifactorial related to baseline anemia, A. fib, and his prior lung injury - PLAN: Continue follow-up with PCP   PLAN SUMMARY & DISPOSITION: Check stool cards x3 CBC with sample to blood bank every 2 weeks Increase Retacrit to 40,000 units every 2 weeks Office visit in 6 weeks, or sooner if needed based on symptoms  All  questions were answered. The patient knows to call the clinic with any problems, questions or concerns.  Medical decision making: Moderate  Time spent on visit: I spent 20 minutes counseling the patient face to face. The total time spent in the appointment was 30 minutes and more than 50% was on counseling.   Harriett Rush, PA-C  07/30/2021 10:28 PM

## 2021-07-30 ENCOUNTER — Other Ambulatory Visit: Payer: Self-pay

## 2021-07-30 ENCOUNTER — Inpatient Hospital Stay (HOSPITAL_BASED_OUTPATIENT_CLINIC_OR_DEPARTMENT_OTHER): Payer: Medicare HMO | Admitting: Physician Assistant

## 2021-07-30 ENCOUNTER — Inpatient Hospital Stay (HOSPITAL_COMMUNITY): Payer: Medicare HMO

## 2021-07-30 ENCOUNTER — Other Ambulatory Visit: Payer: Self-pay | Admitting: Family Medicine

## 2021-07-30 ENCOUNTER — Inpatient Hospital Stay (HOSPITAL_COMMUNITY): Payer: Medicare HMO | Attending: Hematology

## 2021-07-30 VITALS — BP 130/57 | HR 81 | Temp 96.6°F | Resp 21 | Ht 68.5 in | Wt 187.6 lb

## 2021-07-30 DIAGNOSIS — Z794 Long term (current) use of insulin: Secondary | ICD-10-CM | POA: Diagnosis not present

## 2021-07-30 DIAGNOSIS — R0602 Shortness of breath: Secondary | ICD-10-CM | POA: Diagnosis not present

## 2021-07-30 DIAGNOSIS — E119 Type 2 diabetes mellitus without complications: Secondary | ICD-10-CM | POA: Insufficient documentation

## 2021-07-30 DIAGNOSIS — I10 Essential (primary) hypertension: Secondary | ICD-10-CM | POA: Insufficient documentation

## 2021-07-30 DIAGNOSIS — I4891 Unspecified atrial fibrillation: Secondary | ICD-10-CM | POA: Insufficient documentation

## 2021-07-30 DIAGNOSIS — N1832 Chronic kidney disease, stage 3b: Secondary | ICD-10-CM | POA: Diagnosis not present

## 2021-07-30 DIAGNOSIS — I129 Hypertensive chronic kidney disease with stage 1 through stage 4 chronic kidney disease, or unspecified chronic kidney disease: Secondary | ICD-10-CM | POA: Diagnosis not present

## 2021-07-30 DIAGNOSIS — Z79899 Other long term (current) drug therapy: Secondary | ICD-10-CM | POA: Diagnosis not present

## 2021-07-30 DIAGNOSIS — D631 Anemia in chronic kidney disease: Secondary | ICD-10-CM | POA: Diagnosis not present

## 2021-07-30 DIAGNOSIS — D509 Iron deficiency anemia, unspecified: Secondary | ICD-10-CM | POA: Diagnosis not present

## 2021-07-30 DIAGNOSIS — D469 Myelodysplastic syndrome, unspecified: Secondary | ICD-10-CM | POA: Insufficient documentation

## 2021-07-30 DIAGNOSIS — E785 Hyperlipidemia, unspecified: Secondary | ICD-10-CM | POA: Insufficient documentation

## 2021-07-30 DIAGNOSIS — D539 Nutritional anemia, unspecified: Secondary | ICD-10-CM

## 2021-07-30 DIAGNOSIS — J45909 Unspecified asthma, uncomplicated: Secondary | ICD-10-CM | POA: Diagnosis not present

## 2021-07-30 DIAGNOSIS — D638 Anemia in other chronic diseases classified elsewhere: Secondary | ICD-10-CM

## 2021-07-30 DIAGNOSIS — R748 Abnormal levels of other serum enzymes: Secondary | ICD-10-CM | POA: Diagnosis not present

## 2021-07-30 DIAGNOSIS — I2510 Atherosclerotic heart disease of native coronary artery without angina pectoris: Secondary | ICD-10-CM | POA: Insufficient documentation

## 2021-07-30 DIAGNOSIS — N183 Chronic kidney disease, stage 3 unspecified: Secondary | ICD-10-CM

## 2021-07-30 DIAGNOSIS — M1A0620 Idiopathic chronic gout, left knee, without tophus (tophi): Secondary | ICD-10-CM

## 2021-07-30 LAB — CBC WITH DIFFERENTIAL/PLATELET
Abs Immature Granulocytes: 0.04 10*3/uL (ref 0.00–0.07)
Basophils Absolute: 0 10*3/uL (ref 0.0–0.1)
Basophils Relative: 0 %
Eosinophils Absolute: 0 10*3/uL (ref 0.0–0.5)
Eosinophils Relative: 0 %
HCT: 21.9 % — ABNORMAL LOW (ref 39.0–52.0)
Hemoglobin: 7.6 g/dL — ABNORMAL LOW (ref 13.0–17.0)
Immature Granulocytes: 1 %
Lymphocytes Relative: 25 %
Lymphs Abs: 0.9 10*3/uL (ref 0.7–4.0)
MCH: 41.1 pg — ABNORMAL HIGH (ref 26.0–34.0)
MCHC: 34.7 g/dL (ref 30.0–36.0)
MCV: 118.4 fL — ABNORMAL HIGH (ref 80.0–100.0)
Monocytes Absolute: 1.2 10*3/uL — ABNORMAL HIGH (ref 0.1–1.0)
Monocytes Relative: 31 %
Neutro Abs: 1.6 10*3/uL — ABNORMAL LOW (ref 1.7–7.7)
Neutrophils Relative %: 43 %
Platelets: 207 10*3/uL (ref 150–400)
RBC: 1.85 MIL/uL — ABNORMAL LOW (ref 4.22–5.81)
RDW: 15.1 % (ref 11.5–15.5)
WBC Morphology: REACTIVE
WBC: 3.7 10*3/uL — ABNORMAL LOW (ref 4.0–10.5)
nRBC: 0 % (ref 0.0–0.2)

## 2021-07-30 LAB — COMPREHENSIVE METABOLIC PANEL
ALT: 19 U/L (ref 0–44)
AST: 26 U/L (ref 15–41)
Albumin: 4.4 g/dL (ref 3.5–5.0)
Alkaline Phosphatase: 122 U/L (ref 38–126)
Anion gap: 15 (ref 5–15)
BUN: 38 mg/dL — ABNORMAL HIGH (ref 8–23)
CO2: 23 mmol/L (ref 22–32)
Calcium: 9.1 mg/dL (ref 8.9–10.3)
Chloride: 98 mmol/L (ref 98–111)
Creatinine, Ser: 2.12 mg/dL — ABNORMAL HIGH (ref 0.61–1.24)
GFR, Estimated: 32 mL/min — ABNORMAL LOW (ref >60)
Glucose, Bld: 257 mg/dL — ABNORMAL HIGH (ref 70–99)
Potassium: 3.8 mmol/L (ref 3.5–5.1)
Sodium: 136 mmol/L (ref 135–145)
Total Bilirubin: 1.5 mg/dL — ABNORMAL HIGH (ref 0.3–1.2)
Total Protein: 7.1 g/dL (ref 6.5–8.1)

## 2021-07-30 LAB — IRON AND TIBC
Iron: 220 ug/dL — ABNORMAL HIGH (ref 45–182)
Saturation Ratios: 81 % — ABNORMAL HIGH (ref 17.9–39.5)
TIBC: 271 ug/dL (ref 250–450)
UIBC: 51 ug/dL

## 2021-07-30 LAB — VITAMIN B12: Vitamin B-12: 537 pg/mL (ref 180–914)

## 2021-07-30 LAB — FOLATE: Folate: 21.9 ng/mL (ref >5.9)

## 2021-07-30 LAB — FERRITIN: Ferritin: 632 ng/mL — ABNORMAL HIGH (ref 24–336)

## 2021-07-30 MED ORDER — EPOETIN ALFA-EPBX 10000 UNIT/ML IJ SOLN: 10000.0000 [IU] | Freq: Once | INTRAMUSCULAR | Status: DC

## 2021-07-30 MED ORDER — EPOETIN ALFA-EPBX 40000 UNIT/ML IJ SOLN
40000.0000 [IU] | Freq: Once | INTRAMUSCULAR | Status: AC
  Administered 2021-07-30: 40000 [IU] via SUBCUTANEOUS
  Filled 2021-07-30: qty 1

## 2021-07-30 NOTE — Patient Instructions (Signed)
[facility]  Discharge Instructions: Thank you for choosing [facility] to provide your oncology and hematology care.  If you have a lab appointment with the Cancer Center, please come in thru the Main Entrance and check in at the main information desk.  Wear comfortable clothing and clothing appropriate for easy access to any Portacath or PICC line.   We strive to give you quality time with your provider. You may need to reschedule your appointment if you arrive late (15 or more minutes).  Arriving late affects you and other patients whose appointments are after yours.  Also, if you miss three or more appointments without notifying the office, you may be dismissed from the clinic at the provider's discretion.      For prescription refill requests, have your pharmacy contact our office and allow 72 hours for refills to be completed.    Today you received the following chemotherapy and/or immunotherapy agents Retacrit      To help prevent nausea and vomiting after your treatment, we encourage you to take your nausea medication as directed.  BELOW ARE SYMPTOMS THAT SHOULD BE REPORTED IMMEDIATELY: *FEVER GREATER THAN 100.4 F (38 C) OR HIGHER *CHILLS OR SWEATING *NAUSEA AND VOMITING THAT IS NOT CONTROLLED WITH YOUR NAUSEA MEDICATION *UNUSUAL SHORTNESS OF BREATH *UNUSUAL BRUISING OR BLEEDING *URINARY PROBLEMS (pain or burning when urinating, or frequent urination) *BOWEL PROBLEMS (unusual diarrhea, constipation, pain near the anus) TENDERNESS IN MOUTH AND THROAT WITH OR WITHOUT PRESENCE OF ULCERS (sore throat, sores in mouth, or a toothache) UNUSUAL RASH, SWELLING OR PAIN  UNUSUAL VAGINAL DISCHARGE OR ITCHING   Items with * indicate a potential emergency and should be followed up as soon as possible or go to the Emergency Department if any problems should occur.  Please show the CHEMOTHERAPY ALERT CARD or IMMUNOTHERAPY ALERT CARD at check-in to the Emergency  Department and triage nurse.  Should you have questions after your visit or need to cancel or reschedule your appointment, please contact [facility] [phone]  and follow the prompts.  Office hours are 8:00 a.m. to 4:30 p.m. Monday - Friday. Please note that voicemails left after 4:00 p.m. may not be returned until the following business day.  We are closed weekends and major holidays. You have access to a nurse at all times for urgent questions. Please call the main number to the clinic [phone] and follow the prompts.  For any non-urgent questions, you may also contact your provider using MyChart. We now offer e-Visits for anyone 18 and older to request care online for non-urgent symptoms. For details visit [email or web address].   Also download the MyChart app! Go to the app store, search "MyChart", open the app, select [facility], and log in with your MyChart username and password.  Due to Covid, a mask is required upon entering the hospital/clinic. If you do not have a mask, one will be given to you upon arrival. For doctor visits, patients may have 1 support person aged 18 or older with them. For treatment visits, patients cannot have anyone with them due to current Covid guidelines and our immunocompromised population.  

## 2021-07-30 NOTE — Progress Notes (Signed)
Confirmed today's dose of Retacrit to be at 40,000 units.  T.OTarri Abernethy, PA-C/Kinsey Cowsert Ronnald Ramp, PharmD

## 2021-07-30 NOTE — Patient Instructions (Signed)
[place] [facility] Discharge Instructions  You were seen today by Tarri Abernethy PA-C for your anemia.   - Your anemia (low blood counts) caused by your myelodysplastic syndrome ("MDS"), which is a type of bone marrow/blood cancer. - Your blood counts have been much lower than usual lately, so we are going to increase the frequency of your Retacrit injections to Retacrit every 2 weeks. - We will also monitor your blood counts more closely to make sure you are not dropping your blood count too quickly. - We will check stool cards x3 to make sure you do not have any blood in your bowel movements. - If you notice any bloody bowel movements or other signs of bleeding, seek immediate medical attention, since you are at a higher risk of bleeding due to your Eliquis.  LABS: Return every 2 weeks for repeat blood check.  TREATMENT: Retacrit injection every 2 weeks (same day as labs)  FOLLOW-UP APPOINTMENT: Office visit in 6 weeks   Thank you for choosing [place] [facility] to provide your oncology and hematology care.  To afford each patient quality time with our provider, please arrive at least 15 minutes before your scheduled appointment time.   If you have a lab appointment with the [facility] please come in thru the Main Entrance and check in at the main information desk.  You need to re-schedule your appointment should you arrive 10 or more minutes late.  We strive to give you quality time with our providers, and arriving late affects you and other patients whose appointments are after yours.  Also, if you no show three or more times for appointments you may be dismissed from the clinic at the providers discretion.     Again, thank you for choosing [facility].  Our hope is that these requests will decrease the amount of time that you wait before being seen by our physicians.        _____________________________________________________________  Should you have questions after your visit to [facility], please contact our office at [phone] and follow the prompts.  Our office hours are 8:00 a.m. and 4:30 p.m. Monday - Friday.  Please note that voicemails left after 4:00 p.m. may not be returned until the following business day.  We are closed weekends and major holidays.  You do have access to a nurse 24-7, just call the main number to the clinic [phone] and do not press any options, hold on the line and a nurse will answer the phone.    For prescription refill requests, have your pharmacy contact our office and allow 72 hours.    Due to Covid, you will need to wear a mask upon entering the hospital. If you do not have a mask, a mask will be given to you at the Main Entrance upon arrival. For doctor visits, patients may have 1 support person age 100 or older with them. For treatment visits, patients can not have anyone with them due to social distancing guidelines and our immunocompromised population.

## 2021-07-30 NOTE — Progress Notes (Signed)
[facility] presents today for Retacrit injection per the provider's orders.  Stable during administration without incident; injection site WNL; see MAR for injection details.  Patient tolerated procedure well and without incident.  No questions or complaints noted at this time.  Discharge from clinic via wheelchair in stable condition.  Alert and oriented X 3.  Follow up with [facility] as scheduled.

## 2021-07-31 LAB — ERYTHROPOIETIN: Erythropoietin: 1118 m[IU]/mL — ABNORMAL HIGH (ref 2.6–18.5)

## 2021-08-04 ENCOUNTER — Other Ambulatory Visit (HOSPITAL_COMMUNITY): Payer: Self-pay

## 2021-08-04 DIAGNOSIS — N1832 Chronic kidney disease, stage 3b: Secondary | ICD-10-CM | POA: Diagnosis not present

## 2021-08-04 DIAGNOSIS — Z79899 Other long term (current) drug therapy: Secondary | ICD-10-CM | POA: Diagnosis not present

## 2021-08-04 DIAGNOSIS — J45909 Unspecified asthma, uncomplicated: Secondary | ICD-10-CM | POA: Diagnosis not present

## 2021-08-04 DIAGNOSIS — I129 Hypertensive chronic kidney disease with stage 1 through stage 4 chronic kidney disease, or unspecified chronic kidney disease: Secondary | ICD-10-CM | POA: Diagnosis not present

## 2021-08-04 DIAGNOSIS — R0602 Shortness of breath: Secondary | ICD-10-CM | POA: Diagnosis not present

## 2021-08-04 DIAGNOSIS — D469 Myelodysplastic syndrome, unspecified: Secondary | ICD-10-CM | POA: Diagnosis not present

## 2021-08-04 DIAGNOSIS — D509 Iron deficiency anemia, unspecified: Secondary | ICD-10-CM

## 2021-08-04 DIAGNOSIS — I2510 Atherosclerotic heart disease of native coronary artery without angina pectoris: Secondary | ICD-10-CM | POA: Diagnosis not present

## 2021-08-04 DIAGNOSIS — I4891 Unspecified atrial fibrillation: Secondary | ICD-10-CM | POA: Diagnosis not present

## 2021-08-04 DIAGNOSIS — D631 Anemia in chronic kidney disease: Secondary | ICD-10-CM | POA: Diagnosis not present

## 2021-08-04 LAB — OCCULT BLOOD X 1 CARD TO LAB, STOOL
Fecal Occult Bld: NEGATIVE
Fecal Occult Bld: NEGATIVE
Fecal Occult Bld: NEGATIVE

## 2021-08-11 ENCOUNTER — Ambulatory Visit: Payer: Medicare HMO | Admitting: Family Medicine

## 2021-08-12 ENCOUNTER — Inpatient Hospital Stay (HOSPITAL_COMMUNITY)
Admission: EM | Admit: 2021-08-12 | Discharge: 2021-08-15 | DRG: 811 | Disposition: A | Discharge status: Home / Self Care | Payer: Medicare HMO | Attending: Internal Medicine | Admitting: Internal Medicine

## 2021-08-12 ENCOUNTER — Other Ambulatory Visit: Payer: Self-pay

## 2021-08-12 ENCOUNTER — Emergency Department (HOSPITAL_COMMUNITY): Payer: Medicare HMO

## 2021-08-12 ENCOUNTER — Encounter (HOSPITAL_COMMUNITY): Payer: Self-pay | Admitting: *Deleted

## 2021-08-12 DIAGNOSIS — R0602 Shortness of breath: Secondary | ICD-10-CM | POA: Diagnosis not present

## 2021-08-12 DIAGNOSIS — Z825 Family history of asthma and other chronic lower respiratory diseases: Secondary | ICD-10-CM

## 2021-08-12 DIAGNOSIS — I130 Hypertensive heart and chronic kidney disease with heart failure and stage 1 through stage 4 chronic kidney disease, or unspecified chronic kidney disease: Secondary | ICD-10-CM | POA: Diagnosis present

## 2021-08-12 DIAGNOSIS — I2510 Atherosclerotic heart disease of native coronary artery without angina pectoris: Secondary | ICD-10-CM | POA: Diagnosis present

## 2021-08-12 DIAGNOSIS — M1990 Unspecified osteoarthritis, unspecified site: Secondary | ICD-10-CM | POA: Diagnosis present

## 2021-08-12 DIAGNOSIS — Z794 Long term (current) use of insulin: Secondary | ICD-10-CM

## 2021-08-12 DIAGNOSIS — M109 Gout, unspecified: Secondary | ICD-10-CM | POA: Diagnosis present

## 2021-08-12 DIAGNOSIS — D638 Anemia in other chronic diseases classified elsewhere: Secondary | ICD-10-CM | POA: Diagnosis present

## 2021-08-12 DIAGNOSIS — Z66 Do not resuscitate: Secondary | ICD-10-CM | POA: Diagnosis not present

## 2021-08-12 DIAGNOSIS — Z20822 Contact with and (suspected) exposure to covid-19: Secondary | ICD-10-CM | POA: Diagnosis present

## 2021-08-12 DIAGNOSIS — E785 Hyperlipidemia, unspecified: Secondary | ICD-10-CM | POA: Diagnosis present

## 2021-08-12 DIAGNOSIS — D509 Iron deficiency anemia, unspecified: Secondary | ICD-10-CM

## 2021-08-12 DIAGNOSIS — Z833 Family history of diabetes mellitus: Secondary | ICD-10-CM

## 2021-08-12 DIAGNOSIS — Z87891 Personal history of nicotine dependence: Secondary | ICD-10-CM

## 2021-08-12 DIAGNOSIS — E119 Type 2 diabetes mellitus without complications: Secondary | ICD-10-CM

## 2021-08-12 DIAGNOSIS — Z7984 Long term (current) use of oral hypoglycemic drugs: Secondary | ICD-10-CM

## 2021-08-12 DIAGNOSIS — D649 Anemia, unspecified: Secondary | ICD-10-CM | POA: Diagnosis present

## 2021-08-12 DIAGNOSIS — J9601 Acute respiratory failure with hypoxia: Secondary | ICD-10-CM | POA: Diagnosis present

## 2021-08-12 DIAGNOSIS — I5033 Acute on chronic diastolic (congestive) heart failure: Secondary | ICD-10-CM | POA: Diagnosis not present

## 2021-08-12 DIAGNOSIS — J90 Pleural effusion, not elsewhere classified: Secondary | ICD-10-CM | POA: Diagnosis not present

## 2021-08-12 DIAGNOSIS — Z888 Allergy status to other drugs, medicaments and biological substances status: Secondary | ICD-10-CM

## 2021-08-12 DIAGNOSIS — Z885 Allergy status to narcotic agent status: Secondary | ICD-10-CM

## 2021-08-12 DIAGNOSIS — J811 Chronic pulmonary edema: Secondary | ICD-10-CM | POA: Diagnosis not present

## 2021-08-12 DIAGNOSIS — D469 Myelodysplastic syndrome, unspecified: Secondary | ICD-10-CM | POA: Diagnosis not present

## 2021-08-12 DIAGNOSIS — I10 Essential (primary) hypertension: Secondary | ICD-10-CM | POA: Diagnosis present

## 2021-08-12 DIAGNOSIS — Z79899 Other long term (current) drug therapy: Secondary | ICD-10-CM

## 2021-08-12 DIAGNOSIS — E1165 Type 2 diabetes mellitus with hyperglycemia: Secondary | ICD-10-CM

## 2021-08-12 DIAGNOSIS — E1122 Type 2 diabetes mellitus with diabetic chronic kidney disease: Secondary | ICD-10-CM | POA: Diagnosis present

## 2021-08-12 DIAGNOSIS — I480 Paroxysmal atrial fibrillation: Secondary | ICD-10-CM | POA: Diagnosis present

## 2021-08-12 DIAGNOSIS — J810 Acute pulmonary edema: Secondary | ICD-10-CM

## 2021-08-12 DIAGNOSIS — T8089XA Other complications following infusion, transfusion and therapeutic injection, initial encounter: Secondary | ICD-10-CM | POA: Diagnosis not present

## 2021-08-12 DIAGNOSIS — I517 Cardiomegaly: Secondary | ICD-10-CM | POA: Diagnosis not present

## 2021-08-12 DIAGNOSIS — D631 Anemia in chronic kidney disease: Secondary | ICD-10-CM | POA: Diagnosis present

## 2021-08-12 DIAGNOSIS — K760 Fatty (change of) liver, not elsewhere classified: Secondary | ICD-10-CM | POA: Diagnosis present

## 2021-08-12 DIAGNOSIS — N179 Acute kidney failure, unspecified: Secondary | ICD-10-CM

## 2021-08-12 DIAGNOSIS — Z7901 Long term (current) use of anticoagulants: Secondary | ICD-10-CM

## 2021-08-12 DIAGNOSIS — N184 Chronic kidney disease, stage 4 (severe): Secondary | ICD-10-CM | POA: Diagnosis present

## 2021-08-12 DIAGNOSIS — Z809 Family history of malignant neoplasm, unspecified: Secondary | ICD-10-CM

## 2021-08-12 LAB — CBC
HCT: 13.1 % — ABNORMAL LOW (ref 39.0–52.0)
Hemoglobin: 4.5 g/dL — CL (ref 13.0–17.0)
MCH: 42.1 pg — ABNORMAL HIGH (ref 26.0–34.0)
MCHC: 34.4 g/dL (ref 30.0–36.0)
MCV: 122.4 fL — ABNORMAL HIGH (ref 80.0–100.0)
Platelets: 230 10*3/uL (ref 150–400)
RBC: 1.07 MIL/uL — ABNORMAL LOW (ref 4.22–5.81)
RDW: 15.3 % (ref 11.5–15.5)
WBC: 3.6 10*3/uL — ABNORMAL LOW (ref 4.0–10.5)
nRBC: 0 % (ref 0.0–0.2)

## 2021-08-12 LAB — COMPREHENSIVE METABOLIC PANEL
ALT: 16 U/L (ref 0–44)
AST: 20 U/L (ref 15–41)
Albumin: 4.2 g/dL (ref 3.5–5.0)
Alkaline Phosphatase: 120 U/L (ref 38–126)
Anion gap: 15 (ref 5–15)
BUN: 51 mg/dL — ABNORMAL HIGH (ref 8–23)
CO2: 19 mmol/L — ABNORMAL LOW (ref 22–32)
Calcium: 9 mg/dL (ref 8.9–10.3)
Chloride: 100 mmol/L (ref 98–111)
Creatinine, Ser: 2.37 mg/dL — ABNORMAL HIGH (ref 0.61–1.24)
GFR, Estimated: 28 mL/min — ABNORMAL LOW (ref >60)
Glucose, Bld: 244 mg/dL — ABNORMAL HIGH (ref 70–99)
Potassium: 3.9 mmol/L (ref 3.5–5.1)
Sodium: 134 mmol/L — ABNORMAL LOW (ref 135–145)
Total Bilirubin: 1.6 mg/dL — ABNORMAL HIGH (ref 0.3–1.2)
Total Protein: 7 g/dL (ref 6.5–8.1)

## 2021-08-12 LAB — RETICULOCYTES
Immature Retic Fract: 8.3 % (ref 2.3–15.9)
RBC.: 1.08 MIL/uL — ABNORMAL LOW (ref 4.22–5.81)
Retic Count, Absolute: 4.9 10*3/uL — ABNORMAL LOW (ref 19.0–186.0)
Retic Ct Pct: 0.5 % (ref 0.4–3.1)

## 2021-08-12 LAB — POC OCCULT BLOOD, ED: Fecal Occult Bld: NEGATIVE

## 2021-08-12 LAB — LIPASE, BLOOD: Lipase: 35 U/L (ref 11–51)

## 2021-08-12 LAB — PREPARE RBC (CROSSMATCH)

## 2021-08-12 MED ORDER — SODIUM CHLORIDE 0.9 % IV SOLN
10.0000 mL/h | Freq: Once | INTRAVENOUS | Status: AC
  Administered 2021-08-12: 10 mL/h via INTRAVENOUS

## 2021-08-12 NOTE — ED Notes (Signed)
Critical value  HGB  4.5  Reported to edp dixon at this time See orders

## 2021-08-12 NOTE — ED Provider Notes (Signed)
[facility] EMERGENCY DEPARTMENT Provider Note   CSN: [id number] Arrival date & time: 08/12/21  2039     History  No chief complaint on file.   Jack Mejia is a 76 y.o. male.  HPI Patient presents for fatigue and shortness of breath.  He is accompanied on arrival by his wife, who provides most of this history.  He has had ongoing issues with chronic anemia.  He is currently taking iron supplementation daily and undergoing every 2 week infusions for Retacrit.  He is scheduled for an infusion tomorrow.  Over the past several days, patient has had worsening shortness of breath that is exacerbated by any exertion, excessive fatigue, and pallor.  He has not had any known sources of bleeding.  He is on Eliquis.  2 weeks ago, he has had his hemoglobin checked and it was 7.6 at that time.    Home Medications Prior to Admission medications   Medication Sig Start Date End Date Taking? Authorizing Provider  acetaminophen (TYLENOL) 650 MG CR tablet Take 650 mg by mouth every 8 (eight) hours as needed for pain.   Yes [provider]  allopurinol (ZYLOPRIM) 300 MG tablet TAKE 1 TABLET (300 MG TOTAL) BY MOUTH DAILY. START THIS AFTER GOUT FLARE. 07/01/21  Yes Cook, Jayce G, DO  apixaban (ELIQUIS) 5 MG TABS tablet Take 1 tablet (5 mg total) by mouth 2 (two) times daily. Patient taking differently: Take 5 mg by mouth daily. 02/17/21  Yes Verta Ellen., NP  atorvastatin (LIPITOR) 20 MG tablet TAKE 1 TABLET BY MOUTH EVERY DAY 12/30/20  Yes Verta Ellen., NP  augmented betamethasone dipropionate (DIPROLENE-AF) 0.05 % cream Apply topically. 06/30/21  Yes [provider]  BD PEN NEEDLE NANO 2ND GEN 32G X 4 MM MISC Use with insulin pen qid. 08/23/20  Yes Lovena Le, Malena M, DO  clotrimazole-betamethasone (LOTRISONE) cream Apply 1 application topically 2 (two) times daily. Patient taking differently: Apply 1 application topically 2 (two) times daily as needed (irritation). 08/29/20  Yes  McKenzie, Candee Furbish, MD  diltiazem (CARDIZEM CD) 360 MG 24 hr capsule TAKE 1 CAPSULE BY MOUTH EVERY DAY 10/30/20  Yes Verta Ellen., NP  empagliflozin (JARDIANCE) 10 MG TABS tablet Take 1 tablet (10 mg total) by mouth daily before breakfast. 06/25/21  Yes Thersa Salt G, DO  ferrous sulfate 325 (65 FE) MG tablet Take 325 mg by mouth. Takes one every other day   Yes [provider]  LEVEMIR FLEXTOUCH 100 UNIT/ML FlexTouch Pen Inject 24 Units into the skin at bedtime. 07/08/21  Yes Cook, Jayce G, DO  metoprolol tartrate (LOPRESSOR) 25 MG tablet Take 1 tablet (25 mg total) by mouth 2 (two) times daily. 02/27/21  Yes Taylor, Malena M, DO  NOVOLOG FLEXPEN 100 UNIT/ML FlexPen INJECT 15 UNITS INTO THE SKIN 3 (THREE) TIMES DAILY WITH MEALS 11/25/20  Yes Lovena Le, Malena M, DO  ONETOUCH VERIO test strip TEST TWICE DAILY AS DIRECTED 03/28/21  Yes Lovena Le, Malena M, DO  potassium chloride (KLOR-CON) 10 MEQ tablet TAKE 1 TABLET BY MOUTH EVERY DAY 06/06/21  Yes Cook, Jayce G, DO  torsemide (DEMADEX) 20 MG tablet TAKE 1 TABLET BY MOUTH TWICE A DAY 03/19/21  Yes Verta Ellen., NP  traMADol (ULTRAM) 50 MG tablet Take 1-2 tab p.o. q8hrs prn pain Patient taking differently: 50-100 mg every 6 (six) hours as needed for moderate pain. 02/27/21  Yes Lovena Le, [place], DO  Balsam [place]-Castor Oil [facility])  OINT Apply 1 application topically 3 (three) times daily as needed (irritation). Apply to sacrum and bilateral buttocks Patient not taking: Reported on 07/30/2021    [provider]  colchicine 0.6 MG tablet TAKE 1 TABLET BY MOUTH EVERY DAY AS NEEDED FOR GOUT FLARE Patient not taking: Reported on 08/13/2021 07/30/21   Jack Spikes, DO  sertraline (ZOLOFT) 100 MG tablet Take 1 tab p.o. qd Patient not taking: Reported on 08/13/2021 02/27/21   Elvia Collum M, DO      Allergies    Demerol, Meperidine hcl, Vasotec [enalapril], and Lasix [furosemide]    Review of Systems   Review of Systems  Constitutional:   Positive for activity change, appetite change and fatigue. Negative for chills and fever.  HENT:  Negative for congestion, ear pain and sore throat.   Eyes:  Negative for pain and visual disturbance.  Respiratory:  Positive for shortness of breath. Negative for cough, chest tightness, wheezing and stridor.   Cardiovascular:  Negative for chest pain, palpitations and leg swelling.  Gastrointestinal:  Negative for abdominal pain, blood in stool, diarrhea, nausea and vomiting.  Genitourinary:  Negative for dysuria, flank pain and hematuria.  Musculoskeletal:  Negative for arthralgias, back pain, myalgias and neck pain.  Skin:  Positive for pallor. Negative for color change and rash.  Neurological:  Positive for weakness (Generalized). Negative for seizures, syncope, speech difficulty, numbness and headaches.  Hematological:  Bruises/bleeds easily (On Eliquis).  All other systems reviewed and are negative.  Physical Exam Updated Vital Signs BP 127/63    Pulse 87    Temp 98.2 F (36.8 C) (Oral)    Resp 19    Ht 5\' 9"  (1.753 m)    Wt 84.7 kg    SpO2 90%    BMI 27.58 kg/m  Physical Exam Vitals and nursing note reviewed.  Constitutional:      General: He is not in acute distress.    Appearance: He is well-developed. He is ill-appearing. He is not toxic-appearing or diaphoretic.  HENT:     Head: Normocephalic and atraumatic.     Right Ear: External ear normal.     Left Ear: External ear normal.     Nose: Nose normal.     Mouth/Throat:     Mouth: Mucous membranes are moist.     Pharynx: Oropharynx is clear.  Eyes:     Extraocular Movements: Extraocular movements intact.     Conjunctiva/sclera: Conjunctivae normal.  Cardiovascular:     Rate and Rhythm: Normal rate and regular rhythm.     Heart sounds: No murmur heard. Pulmonary:     Effort: Pulmonary effort is normal. No respiratory distress.     Breath sounds: No wheezing or rales.  Abdominal:     Palpations: Abdomen is soft.      Tenderness: There is no abdominal tenderness.  Musculoskeletal:        General: No swelling. Normal range of motion.     Cervical back: Normal range of motion and neck supple. No tenderness.  Skin:    General: Skin is warm and dry.     Capillary Refill: Capillary refill takes less than 2 seconds.     Coloration: Skin is pale.  Neurological:     General: No focal deficit present.     Mental Status: He is alert. Mental status is at baseline.  Psychiatric:        Mood and Affect: Mood normal.        Behavior: Behavior  normal.    ED Results / Procedures / Treatments   Labs (all labs ordered are listed, but only abnormal results are displayed) Labs Reviewed  COMPREHENSIVE METABOLIC PANEL - Abnormal; Notable for the following components:      Result Value   Sodium 134 (*)    CO2 19 (*)    Glucose, Bld 244 (*)    BUN 51 (*)    Creatinine, Ser 2.37 (*)    Total Bilirubin 1.6 (*)    GFR, Estimated 28 (*)    All other components within normal limits  CBC - Abnormal; Notable for the following components:   WBC 3.6 (*)    RBC 1.07 (*)    Hemoglobin 4.5 (*)    HCT 13.1 (*)    MCV 122.4 (*)    MCH 42.1 (*)    All other components within normal limits  URINALYSIS, ROUTINE W REFLEX MICROSCOPIC - Abnormal; Notable for the following components:   Glucose, UA >=500 (*)    All other components within normal limits  RETICULOCYTES - Abnormal; Notable for the following components:   RBC. 1.08 (*)    Retic Count, Absolute 4.9 (*)    All other components within normal limits  HEMOGLOBIN A1C - Abnormal; Notable for the following components:   Hgb A1c MFr Bld 6.9 (*)    All other components within normal limits  GLUCOSE, CAPILLARY - Abnormal; Notable for the following components:   Glucose-Capillary 220 (*)    All other components within normal limits  GLUCOSE, CAPILLARY - Abnormal; Notable for the following components:   Glucose-Capillary 212 (*)    All other components within normal  limits  CBC - Abnormal; Notable for the following components:   WBC 2.9 (*)    RBC 2.21 (*)    Hemoglobin 7.8 (*)    HCT 22.2 (*)    MCV 100.5 (*)    MCH 35.3 (*)    RDW 22.9 (*)    All other components within normal limits  BASIC METABOLIC PANEL - Abnormal; Notable for the following components:   Sodium 134 (*)    CO2 21 (*)    Glucose, Bld 231 (*)    BUN 51 (*)    Creatinine, Ser 2.22 (*)    Calcium 8.3 (*)    GFR, Estimated 30 (*)    All other components within normal limits  GLUCOSE, CAPILLARY - Abnormal; Notable for the following components:   Glucose-Capillary 216 (*)    All other components within normal limits  CBC - Abnormal; Notable for the following components:   WBC 3.2 (*)    RBC 2.22 (*)    Hemoglobin 7.5 (*)    HCT 22.1 (*)    RDW 22.5 (*)    All other components within normal limits  COMPREHENSIVE METABOLIC PANEL - Abnormal; Notable for the following components:   Potassium 3.4 (*)    Glucose, Bld 175 (*)    BUN 50 (*)    Creatinine, Ser 1.92 (*)    Calcium 8.5 (*)    Total Protein 6.1 (*)    Total Bilirubin 2.0 (*)    GFR, Estimated 36 (*)    All other components within normal limits  MAGNESIUM - Abnormal; Notable for the following components:   Magnesium 2.6 (*)    All other components within normal limits  LACTATE DEHYDROGENASE - Abnormal; Notable for the following components:   LDH 213 (*)    All other components within normal limits  GLUCOSE, CAPILLARY - Abnormal; Notable for the following components:   Glucose-Capillary 178 (*)    All other components within normal limits  GLUCOSE, CAPILLARY - Abnormal; Notable for the following components:   Glucose-Capillary 169 (*)    All other components within normal limits  GLUCOSE, CAPILLARY - Abnormal; Notable for the following components:   Glucose-Capillary 165 (*)    All other components within normal limits  GLUCOSE, CAPILLARY - Abnormal; Notable for the following components:   Glucose-Capillary  184 (*)    All other components within normal limits  GLUCOSE, CAPILLARY - Abnormal; Notable for the following components:   Glucose-Capillary 233 (*)    All other components within normal limits  RESP PANEL BY RT-PCR (FLU A&B, COVID) ARPGX2  MRSA NEXT GEN BY PCR, NASAL  LIPASE, BLOOD  URINALYSIS, MICROSCOPIC (REFLEX)  PHOSPHORUS  HAPTOGLOBIN  PROTEIN ELECTROPHORESIS, SERUM  POC OCCULT BLOOD, ED  TYPE AND SCREEN  PREPARE RBC (CROSSMATCH)  DIRECT ANTIGLOBULIN TEST (NOT AT [facility])  PREPARE RBC (CROSSMATCH)    EKG None  Radiology DG Chest Port 1 View  Result Date: 08/13/2021 CLINICAL DATA:  Hypoxia and weakness, initial encounter EXAM: PORTABLE CHEST 1 VIEW COMPARISON:  08/12/2021 FINDINGS: Cardiac shadow remains enlarged. Increasing vascular congestion is noted with evidence of pulmonary edema and small effusions bilaterally. No focal confluent infiltrate is seen. No bony abnormality is noted. IMPRESSION: Increasing changes of CHF with effusions and pulmonary edema. Electronically Signed   By: Inez Catalina M.D.   On: 08/13/2021 03:34   DG Chest Portable 1 View  Result Date: 08/12/2021 CLINICAL DATA:  Shortness of breath. EXAM: PORTABLE CHEST 1 VIEW COMPARISON:  Chest x-ray 03/04/2021. FINDINGS: The heart is enlarged. There central pulmonary vascular congestion. There are central interstitial opacities throughout both lungs. There are patchy airspace opacities in the left lung base. There are small bilateral pleural effusions. There is no pneumothorax or acute fracture. IMPRESSION: 1. Cardiomegaly with mild pulmonary edema and small pleural effusions. Electronically Signed   By: Ronney Asters M.D.   On: 08/12/2021 23:47   ECHOCARDIOGRAM COMPLETE  Result Date: 08/14/2021    ECHOCARDIOGRAM REPORT   Patient Name:   Jack Mejia Date of Exam: 08/14/2021 Medical Rec #:  [id number]       Height:       69.0 in Accession #:    [id number]      Weight:       186.7 lb Date of Birth:  May 30, 1946       BSA:          2.007 m Patient Age:    68 years        BP:           126/67 mmHg Patient Gender: M               HR:           80 bpm. Exam Location:  [facility] Procedure: 2D Echo, Cardiac Doppler and Color Doppler Indications:    CHF  History:        Patient has prior history of Echocardiogram examinations, most                 recent 09/26/2020. CHF, CAD, Arrythmias:Atrial Fibrillation,                 Signs/Symptoms:Shortness of Breath; Risk Factors:Hypertension,                 Diabetes, Dyslipidemia and Former Smoker.  Sonographer:    Wenda Low Referring Phys: [place]  1. Left ventricular ejection fraction, by estimation, is 55 to 60%. The left ventricle has normal function. The left ventricle has no regional wall motion abnormalities. There is moderate left ventricular hypertrophy. Left ventricular diastolic parameters are indeterminate. Elevated left atrial pressure.  2. Right ventricular systolic function is normal. The right ventricular size is normal. There is mildly elevated pulmonary artery systolic pressure.  3. Left atrial size was mildly dilated.  4. The mitral valve is normal in structure. Mild mitral valve regurgitation. No evidence of mitral stenosis.  5. The aortic valve is tricuspid. There is moderate calcification of the aortic valve. There is moderate thickening of the aortic valve. Aortic valve regurgitation is not visualized. Moderate aortic valve stenosis.  6. The inferior vena cava is normal in size with greater than 50% respiratory variability, suggesting right atrial pressure of 3 mmHg. FINDINGS  Left Ventricle: Left ventricular ejection fraction, by estimation, is 55 to 60%. The left ventricle has normal function. The left ventricle has no regional wall motion abnormalities. The left ventricular internal cavity size was normal in size. There is  moderate left ventricular hypertrophy. Left ventricular diastolic parameters are indeterminate. Elevated left  atrial pressure. Right Ventricle: The right ventricular size is normal. No increase in right ventricular wall thickness. Right ventricular systolic function is normal. There is mildly elevated pulmonary artery systolic pressure. The tricuspid regurgitant velocity is 3.07  m/s, and with an assumed right atrial pressure of 3 mmHg, the estimated right ventricular systolic pressure is 52.8 mmHg. Left Atrium: Left atrial size was mildly dilated. Right Atrium: Right atrial size was normal in size. Pericardium: There is no evidence of pericardial effusion. Mitral Valve: The mitral valve is normal in structure. Mild mitral valve regurgitation. No evidence of mitral valve stenosis. MV peak gradient, 6.2 mmHg. The mean mitral valve gradient is 3.0 mmHg. Tricuspid Valve: The tricuspid valve is normal in structure. Tricuspid valve regurgitation is mild . No evidence of tricuspid stenosis. Aortic Valve: The aortic valve is tricuspid. There is moderate calcification of the aortic valve. There is moderate thickening of the aortic valve. There is moderate aortic valve annular calcification. Aortic valve regurgitation is not visualized. Moderate aortic stenosis is present. Aortic valve mean gradient measures 24.0 mmHg. Aortic valve peak gradient measures 44.0 mmHg. Aortic valve area, by VTI measures 1.07 cm. Pulmonic Valve: The pulmonic valve was not well visualized. Pulmonic valve regurgitation is not visualized. No evidence of pulmonic stenosis. Aorta: The aortic root is normal in size and structure. Venous: The inferior vena cava is normal in size with greater than 50% respiratory variability, suggesting right atrial pressure of 3 mmHg. IAS/Shunts: No atrial level shunt detected by color flow Doppler.  LEFT VENTRICLE PLAX 2D LVIDd:         5.00 cm     Diastology LVIDs:         3.10 cm     LV e' medial:    5.66 cm/s LV PW:         1.30 cm     LV E/e' medial:  20.3 LV IVS:        1.30 cm     LV e' lateral:   7.83 cm/s LVOT diam:      2.00 cm     LV E/e' lateral: 14.7 LV SV:         81 LV SV Index:   40 LVOT Area:  3.14 cm  LV Volumes (MOD) LV vol d, MOD A2C: 58.1 ml LV vol d, MOD A4C: 43.1 ml LV vol s, MOD A2C: 25.7 ml LV vol s, MOD A4C: 19.0 ml LV SV MOD A2C:     32.4 ml LV SV MOD A4C:     43.1 ml LV SV MOD BP:      28.9 ml RIGHT VENTRICLE RV Basal diam:  3.45 cm RV Mid diam:    3.30 cm RV S prime:     13.20 cm/s TAPSE (M-mode): 2.5 cm LEFT ATRIUM             Index        RIGHT ATRIUM           Index LA diam:        4.10 cm 2.04 cm/m   RA Area:     18.70 cm LA Vol (A2C):   50.6 ml 25.22 ml/m  RA Volume:   50.90 ml  25.37 ml/m LA Vol (A4C):   74.2 ml 36.98 ml/m LA Biplane Vol: 64.8 ml 32.29 ml/m  AORTIC VALVE                     PULMONIC VALVE AV Area (Vmax):    1.01 cm      PV Vmax:       0.89 m/s AV Area (Vmean):   0.96 cm      PV Peak grad:  3.1 mmHg AV Area (VTI):     1.07 cm AV Vmax:           331.50 cm/s AV Vmean:          225.500 cm/s AV VTI:            0.756 m AV Peak Grad:      44.0 mmHg AV Mean Grad:      24.0 mmHg LVOT Vmax:         106.95 cm/s LVOT Vmean:        69.150 cm/s LVOT VTI:          0.258 m LVOT/AV VTI ratio: 0.34  AORTA Ao Root diam: 3.20 cm Ao Asc diam:  2.80 cm MITRAL VALVE                TRICUSPID VALVE MV Area (PHT): 3.42 cm     TR Peak grad:   37.7 mmHg MV Area VTI:   2.33 cm     TR Vmax:        307.00 cm/s MV Peak grad:  6.2 mmHg MV Mean grad:  3.0 mmHg     SHUNTS MV Vmax:       1.25 m/s     Systemic VTI:  0.26 m MV Vmean:      84.3 cm/s    Systemic Diam: 2.00 cm MV Decel Time: 222 msec MV E velocity: 115.00 cm/s MV A velocity: 116.00 cm/s MV E/A ratio:  0.99 Carlyle Dolly MD Electronically signed by Carlyle Dolly MD Signature Date/Time: 08/14/2021/1:12:54 PM    Final     Procedures Procedures    Medications Ordered in ED Medications  insulin glargine-yfgn [facility]) injection 10 Units (10 Units Subcutaneous Given 08/13/21 2202)  insulin aspart (novoLOG) injection 0-9 Units (3 Units  Subcutaneous Given 08/14/21 1644)  insulin aspart (novoLOG) injection 0-5 Units (0 Units Subcutaneous Not Given 08/13/21 2202)  insulin aspart (novoLOG) injection 3 Units (3 Units Subcutaneous Given 08/14/21 1645)  atorvastatin (LIPITOR) tablet 20 mg (20 mg Oral  Given 08/13/21 1702)  metoprolol tartrate (LOPRESSOR) tablet 25 mg (25 mg Oral Given 08/14/21 1043)  ferrous sulfate tablet 325 mg (325 mg Oral Given 08/14/21 0752)  Chlorhexidine Gluconate Cloth 2 % PADS 6 each (6 each Topical Given 08/14/21 1211)  ondansetron (ZOFRAN) injection 4 mg (has no administration in time range)  torsemide (DEMADEX) tablet 20 mg (20 mg Oral Given 08/14/21 0752)  MEDLINE mouth rinse (15 mLs Mouth Rinse Given 08/14/21 1211)  0.9 %  sodium chloride infusion (Manually program via Guardrails IV Fluids) (has no administration in time range)  furosemide (LASIX) injection 20 mg (has no administration in time range)  0.9 %  sodium chloride infusion (0 mL/hr Intravenous Stopped 08/13/21 0700)  torsemide (DEMADEX) tablet 20 mg (20 mg Oral Given 08/13/21 0053)  furosemide (LASIX) injection 20 mg (20 mg Intravenous Given 08/13/21 0311)  furosemide (LASIX) injection 20 mg (20 mg Intravenous Given 08/13/21 0725)    ED Course/ Medical Decision Making/ A&P                           Medical Decision Making Amount and/or Complexity of Data Reviewed Labs: ordered. Radiology: ordered.  Risk Prescription drug management. Decision regarding hospitalization.   This patient presents to the ED for concern of fatigue and shortness of breath, this involves an extensive number of treatment options, and is a complaint that carries with it a high risk of complications and morbidity.  The differential diagnosis includes acute on chronic anemia, pneumonia, PE, CHF, COPD, cardiac valvular dysfunction   Co morbidities that complicate the patient evaluation  CAD, asthma, A. 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  The pertinent results include: Severe anemia with hemoglobin of 4.5; slight worsening of chronic kidney disease and azotemia; mild hyperglycemia   Imaging Studies ordered:  I ordered imaging studies including chest x-ray I independently visualized and interpreted imaging which showed cardiomegaly with mild pulmonary edema and bilateral pleural effusions I agree with the radiologist interpretation   Medicines ordered and prescription drug management:  I ordered medication including PRBCs for symptomatic anemia Reevaluation of the patient after these medicines showed that the patient stayed the same I have reviewed the patients home medicines and have made adjustments as needed   Problem List / ED Course:  Patient is a 76 year old male who is currently followed by heme-onc for chronic anemia and currently undergoing Retacrit infusions and p.o. iron supplementation, who presents with his wife to the ED for worsening fatigue and shortness of breath.  On arrival, he is pale and ill-appearing.  His all signs are notable for widened pulse pressure, consistent with anemia.  He has a normal heart rate, which is likely due to multiple AV nodal blocking agents that he takes for his atrial fibrillation.  Lab work was obtained and patient was found to have a hemoglobin of 4.5.  This does explain his recent symptoms.  Patient and wife were consented for blood transfusion.  Prior to initiation of blood transfusion, patient became tachypneic and short of breath.  He was placed on supplemental oxygen.  Blood transfusion was initiated without any worsening.  His tachypnea and shortness of breath does appear to be intermittent.  I did obtain a chest x-ray which showed mild pulmonary  edema and bilateral pleural  effusions.  Patient did undergo an echocardiogram 10 months ago.  At that time he had a normal LVEF.  He had mildly impaired relaxation.  There was mild valvular dysfunction only.  Patient is prescribed torsemide.  He will require admission to the hospital.  I talked to the hospitalist regarding his need for blood transfusions as well as his signs of pulmonary edema.  Hospitalist requested dose of Lasix after his first unit is transfused.  Patient has a listed allergy to Lasix and home torsemide was ordered instead.  Patient was admitted for further management.   Reevaluation:  After the interventions noted above, I reevaluated the patient and found that they have :stayed the same   Social Determinants of Health:  Patient has strong family support at home through his wife.  He has access to outpatient care, including frequent follow-up visits with heme-onc.   Dispostion:  After consideration of the diagnostic results and the patients response to treatment, I feel that the patent would benefit from admission to hospital.    CRITICAL CARE Performed by: Godfrey Pick   Total critical care time: 35 minutes  Critical care time was exclusive of separately billable procedures and treating other patients.  Critical care was necessary to treat or prevent imminent or life-threatening deterioration.  Critical care was time spent personally by me on the following activities: development of treatment plan with patient and/or surrogate as well as nursing, discussions with consultants, evaluation of patient's response to treatment, examination of patient, obtaining history from patient or surrogate, ordering and performing treatments and interventions, ordering and review of laboratory studies, ordering and review of radiographic studies, pulse oximetry and re-evaluation of patient's condition.        Final Clinical Impression(s) / ED Diagnoses Final diagnoses:  Symptomatic anemia    Rx / DC  Orders ED Discharge Orders     None         Godfrey Pick, MD 08/14/21 1831

## 2021-08-12 NOTE — ED Triage Notes (Addendum)
Pt with continued SOB ( wife states pt with SOB for awhile) and generalized weakness. Pt with abd and emesis.  Pt with hx of anemia and sees Cancer center for it.  Pt is pale.

## 2021-08-12 NOTE — ED Notes (Signed)
Pt placed on 5L due to hypoxia

## 2021-08-13 ENCOUNTER — Other Ambulatory Visit (HOSPITAL_COMMUNITY): Payer: Medicare HMO

## 2021-08-13 ENCOUNTER — Ambulatory Visit (HOSPITAL_COMMUNITY): Payer: Medicare HMO

## 2021-08-13 ENCOUNTER — Observation Stay (HOSPITAL_COMMUNITY): Payer: Medicare HMO

## 2021-08-13 ENCOUNTER — Encounter (HOSPITAL_COMMUNITY): Payer: Self-pay

## 2021-08-13 ENCOUNTER — Inpatient Hospital Stay (HOSPITAL_COMMUNITY): Payer: Medicare HMO

## 2021-08-13 DIAGNOSIS — I2510 Atherosclerotic heart disease of native coronary artery without angina pectoris: Secondary | ICD-10-CM | POA: Diagnosis present

## 2021-08-13 DIAGNOSIS — N189 Chronic kidney disease, unspecified: Secondary | ICD-10-CM | POA: Diagnosis not present

## 2021-08-13 DIAGNOSIS — D469 Myelodysplastic syndrome, unspecified: Secondary | ICD-10-CM | POA: Diagnosis not present

## 2021-08-13 DIAGNOSIS — E1122 Type 2 diabetes mellitus with diabetic chronic kidney disease: Secondary | ICD-10-CM | POA: Diagnosis not present

## 2021-08-13 DIAGNOSIS — Z833 Family history of diabetes mellitus: Secondary | ICD-10-CM | POA: Diagnosis not present

## 2021-08-13 DIAGNOSIS — E1165 Type 2 diabetes mellitus with hyperglycemia: Secondary | ICD-10-CM

## 2021-08-13 DIAGNOSIS — M1990 Unspecified osteoarthritis, unspecified site: Secondary | ICD-10-CM | POA: Diagnosis present

## 2021-08-13 DIAGNOSIS — Z7901 Long term (current) use of anticoagulants: Secondary | ICD-10-CM | POA: Diagnosis not present

## 2021-08-13 DIAGNOSIS — I130 Hypertensive heart and chronic kidney disease with heart failure and stage 1 through stage 4 chronic kidney disease, or unspecified chronic kidney disease: Secondary | ICD-10-CM | POA: Diagnosis present

## 2021-08-13 DIAGNOSIS — D638 Anemia in other chronic diseases classified elsewhere: Secondary | ICD-10-CM | POA: Diagnosis not present

## 2021-08-13 DIAGNOSIS — J810 Acute pulmonary edema: Secondary | ICD-10-CM | POA: Diagnosis not present

## 2021-08-13 DIAGNOSIS — D509 Iron deficiency anemia, unspecified: Secondary | ICD-10-CM | POA: Diagnosis not present

## 2021-08-13 DIAGNOSIS — K760 Fatty (change of) liver, not elsewhere classified: Secondary | ICD-10-CM | POA: Diagnosis present

## 2021-08-13 DIAGNOSIS — J811 Chronic pulmonary edema: Secondary | ICD-10-CM | POA: Diagnosis not present

## 2021-08-13 DIAGNOSIS — R0902 Hypoxemia: Secondary | ICD-10-CM | POA: Diagnosis not present

## 2021-08-13 DIAGNOSIS — I509 Heart failure, unspecified: Secondary | ICD-10-CM | POA: Diagnosis not present

## 2021-08-13 DIAGNOSIS — R531 Weakness: Secondary | ICD-10-CM | POA: Diagnosis not present

## 2021-08-13 DIAGNOSIS — D631 Anemia in chronic kidney disease: Secondary | ICD-10-CM | POA: Diagnosis not present

## 2021-08-13 DIAGNOSIS — E785 Hyperlipidemia, unspecified: Secondary | ICD-10-CM | POA: Diagnosis present

## 2021-08-13 DIAGNOSIS — I10 Essential (primary) hypertension: Secondary | ICD-10-CM | POA: Diagnosis not present

## 2021-08-13 DIAGNOSIS — D649 Anemia, unspecified: Secondary | ICD-10-CM | POA: Diagnosis not present

## 2021-08-13 DIAGNOSIS — Z885 Allergy status to narcotic agent status: Secondary | ICD-10-CM | POA: Diagnosis not present

## 2021-08-13 DIAGNOSIS — N179 Acute kidney failure, unspecified: Secondary | ICD-10-CM | POA: Diagnosis not present

## 2021-08-13 DIAGNOSIS — I5033 Acute on chronic diastolic (congestive) heart failure: Secondary | ICD-10-CM | POA: Diagnosis not present

## 2021-08-13 DIAGNOSIS — M109 Gout, unspecified: Secondary | ICD-10-CM | POA: Diagnosis present

## 2021-08-13 DIAGNOSIS — R0602 Shortness of breath: Secondary | ICD-10-CM | POA: Diagnosis not present

## 2021-08-13 DIAGNOSIS — Z888 Allergy status to other drugs, medicaments and biological substances status: Secondary | ICD-10-CM | POA: Diagnosis not present

## 2021-08-13 DIAGNOSIS — Z79899 Other long term (current) drug therapy: Secondary | ICD-10-CM | POA: Diagnosis not present

## 2021-08-13 DIAGNOSIS — J9601 Acute respiratory failure with hypoxia: Secondary | ICD-10-CM | POA: Diagnosis not present

## 2021-08-13 DIAGNOSIS — N184 Chronic kidney disease, stage 4 (severe): Secondary | ICD-10-CM | POA: Diagnosis present

## 2021-08-13 DIAGNOSIS — J90 Pleural effusion, not elsewhere classified: Secondary | ICD-10-CM | POA: Diagnosis not present

## 2021-08-13 DIAGNOSIS — Z20822 Contact with and (suspected) exposure to covid-19: Secondary | ICD-10-CM | POA: Diagnosis not present

## 2021-08-13 DIAGNOSIS — Z66 Do not resuscitate: Secondary | ICD-10-CM | POA: Diagnosis not present

## 2021-08-13 DIAGNOSIS — Z794 Long term (current) use of insulin: Secondary | ICD-10-CM | POA: Diagnosis not present

## 2021-08-13 DIAGNOSIS — Z87891 Personal history of nicotine dependence: Secondary | ICD-10-CM | POA: Diagnosis not present

## 2021-08-13 DIAGNOSIS — Z7984 Long term (current) use of oral hypoglycemic drugs: Secondary | ICD-10-CM | POA: Diagnosis not present

## 2021-08-13 LAB — GLUCOSE, CAPILLARY
Glucose-Capillary: 220 mg/dL — ABNORMAL HIGH (ref 70–99)
Glucose-Capillary: 212 mg/dL — ABNORMAL HIGH (ref 70–99)
Glucose-Capillary: 216 mg/dL — ABNORMAL HIGH (ref 70–99)
Glucose-Capillary: 178 mg/dL — ABNORMAL HIGH (ref 70–99)
Glucose-Capillary: 169 mg/dL — ABNORMAL HIGH (ref 70–99)

## 2021-08-13 LAB — HEMOGLOBIN A1C
Hgb A1c MFr Bld: 6.9 % — ABNORMAL HIGH (ref 4.8–5.6)
Mean Plasma Glucose: 151.33 mg/dL

## 2021-08-13 LAB — BASIC METABOLIC PANEL
Anion gap: 13 (ref 5–15)
BUN: 51 mg/dL — ABNORMAL HIGH (ref 8–23)
CO2: 21 mmol/L — ABNORMAL LOW (ref 22–32)
Calcium: 8.3 mg/dL — ABNORMAL LOW (ref 8.9–10.3)
Chloride: 100 mmol/L (ref 98–111)
Creatinine, Ser: 2.22 mg/dL — ABNORMAL HIGH (ref 0.61–1.24)
GFR, Estimated: 30 mL/min — ABNORMAL LOW (ref >60)
Glucose, Bld: 231 mg/dL — ABNORMAL HIGH (ref 70–99)
Potassium: 3.5 mmol/L (ref 3.5–5.1)
Sodium: 134 mmol/L — ABNORMAL LOW (ref 135–145)

## 2021-08-13 LAB — URINALYSIS, ROUTINE W REFLEX MICROSCOPIC
Bilirubin Urine: NEGATIVE
Glucose, UA: >=500 mg/dL — AB
Hgb urine dipstick: NEGATIVE
Ketones, ur: NEGATIVE mg/dL
Leukocytes,Ua: NEGATIVE
Nitrite: NEGATIVE
Protein, ur: NEGATIVE mg/dL
Specific Gravity, Urine: 1.015 (ref 1.005–1.030)
pH: 5.5 (ref 5.0–8.0)

## 2021-08-13 LAB — DIRECT ANTIGLOBULIN TEST (NOT AT ARMC)
DAT, IgG: NEGATIVE
DAT, complement: NEGATIVE

## 2021-08-13 LAB — URINALYSIS, MICROSCOPIC (REFLEX)
Bacteria, UA: NONE SEEN
RBC / HPF: NONE SEEN RBC/hpf (ref 0–5)
Squamous Epithelial / HPF: NONE SEEN (ref 0–5)
WBC, UA: NONE SEEN WBC/hpf (ref 0–5)

## 2021-08-13 LAB — CBC
HCT: 22.2 % — ABNORMAL LOW (ref 39.0–52.0)
Hemoglobin: 7.8 g/dL — ABNORMAL LOW (ref 13.0–17.0)
MCH: 35.3 pg — ABNORMAL HIGH (ref 26.0–34.0)
MCHC: 35.1 g/dL (ref 30.0–36.0)
MCV: 100.5 fL — ABNORMAL HIGH (ref 80.0–100.0)
Platelets: 205 10*3/uL (ref 150–400)
RBC: 2.21 MIL/uL — ABNORMAL LOW (ref 4.22–5.81)
RDW: 22.9 % — ABNORMAL HIGH (ref 11.5–15.5)
WBC: 2.9 10*3/uL — ABNORMAL LOW (ref 4.0–10.5)
nRBC: 0 % (ref 0.0–0.2)

## 2021-08-13 LAB — LACTATE DEHYDROGENASE: LDH: 213 U/L — ABNORMAL HIGH (ref 98–192)

## 2021-08-13 LAB — RESP PANEL BY RT-PCR (FLU A&B, COVID) ARPGX2
Influenza A by PCR: NEGATIVE
Influenza B by PCR: NEGATIVE
SARS Coronavirus 2 by RT PCR: NEGATIVE

## 2021-08-13 LAB — MRSA NEXT GEN BY PCR, NASAL: MRSA by PCR Next Gen: NOT DETECTED

## 2021-08-13 MED ORDER — INSULIN ASPART 100 UNIT/ML IJ SOLN
0.0000 [IU] | Freq: Three times a day (TID) | INTRAMUSCULAR | Status: DC
  Administered 2021-08-13 – 2021-08-14 (×4): 3 [IU] via SUBCUTANEOUS
  Administered 2021-08-14 – 2021-08-15 (×3): 2 [IU] via SUBCUTANEOUS
  Administered 2021-08-15: 3 [IU] via SUBCUTANEOUS

## 2021-08-13 MED ORDER — INSULIN GLARGINE-YFGN 100 UNIT/ML ~~LOC~~ SOLN
10.0000 [IU] | Freq: Every day | SUBCUTANEOUS | Status: DC
  Administered 2021-08-13 – 2021-08-14 (×2): 10 [IU] via SUBCUTANEOUS
  Filled 2021-08-13 (×3): qty 0.1

## 2021-08-13 MED ORDER — FUROSEMIDE 10 MG/ML IJ SOLN
20.0000 mg | Freq: Once | INTRAMUSCULAR | Status: AC
  Administered 2021-08-13: 20 mg via INTRAVENOUS
  Filled 2021-08-13: qty 2

## 2021-08-13 MED ORDER — TORSEMIDE 20 MG PO TABS
20.0000 mg | ORAL_TABLET | Freq: Once | ORAL | Status: AC
  Administered 2021-08-13: 20 mg via ORAL
  Filled 2021-08-13: qty 1

## 2021-08-13 MED ORDER — INSULIN ASPART 100 UNIT/ML IJ SOLN: 0.0000 [IU] | Freq: Every day | INTRAMUSCULAR | Status: DC

## 2021-08-13 MED ORDER — METOPROLOL TARTRATE 25 MG PO TABS
25.0000 mg | ORAL_TABLET | Freq: Two times a day (BID) | ORAL | Status: DC
  Administered 2021-08-13 – 2021-08-15 (×5): 25 mg via ORAL
  Filled 2021-08-13 (×6): qty 1

## 2021-08-13 MED ORDER — ORAL CARE MOUTH RINSE
15.0000 mL | Freq: Two times a day (BID) | OROMUCOSAL | Status: DC
  Administered 2021-08-14 – 2021-08-15 (×3): 15 mL via OROMUCOSAL

## 2021-08-13 MED ORDER — ONDANSETRON HCL 4 MG/2ML IJ SOLN: 4.0000 mg | Freq: Four times a day (QID) | INTRAMUSCULAR | Status: DC | PRN

## 2021-08-13 MED ORDER — INSULIN ASPART 100 UNIT/ML IJ SOLN
3.0000 [IU] | Freq: Three times a day (TID) | INTRAMUSCULAR | Status: DC
  Administered 2021-08-13 – 2021-08-15 (×7): 3 [IU] via SUBCUTANEOUS

## 2021-08-13 MED ORDER — FERROUS SULFATE 325 (65 FE) MG PO TABS
325.0000 mg | ORAL_TABLET | Freq: Every day | ORAL | Status: DC
  Administered 2021-08-14 – 2021-08-15 (×2): 325 mg via ORAL
  Filled 2021-08-13 (×2): qty 1

## 2021-08-13 MED ORDER — CHLORHEXIDINE GLUCONATE CLOTH 2 % EX PADS
6.0000 | MEDICATED_PAD | Freq: Every day | CUTANEOUS | Status: DC
  Administered 2021-08-13 – 2021-08-15 (×3): 6 via TOPICAL

## 2021-08-13 MED ORDER — TORSEMIDE 20 MG PO TABS
20.0000 mg | ORAL_TABLET | Freq: Two times a day (BID) | ORAL | Status: DC
  Administered 2021-08-13 – 2021-08-15 (×4): 20 mg via ORAL
  Filled 2021-08-13 (×4): qty 1

## 2021-08-13 MED ORDER — ATORVASTATIN CALCIUM 20 MG PO TABS
20.0000 mg | ORAL_TABLET | Freq: Every day | ORAL | Status: DC
  Administered 2021-08-13 – 2021-08-14 (×2): 20 mg via ORAL
  Filled 2021-08-13 (×2): qty 1

## 2021-08-13 MED ORDER — ONDANSETRON HCL 4 MG/2ML IJ SOLN
INTRAMUSCULAR | Status: AC
  Filled 2021-08-13: qty 2

## 2021-08-13 NOTE — ED Notes (Signed)
Pt increased to 6L due to continued hypoxia

## 2021-08-13 NOTE — Progress Notes (Signed)
Patient has been doing well off BIPAP all day. Currently in no distress and on nasal cannula. Unit at bedside if needed. Will continue to monitor.

## 2021-08-13 NOTE — TOC Progression Note (Signed)
Transition of Care [facility]) - Progression Note    Patient Details  Name: Jack Mejia MRN: [id number] Date of Birth: 07-24-1946  Transition of Care [facility]) CM/SW Contact  Boneta Lucks, RN Phone Number: 08/13/2021, 11:29 AM  Clinical Narrative:   Patient in OBS for symptomatic anemia, on 6L of oxygen. TOC to follow for DC needs, plan for 2 days.     Barriers to Discharge: Continued Medical Work up

## 2021-08-13 NOTE — Plan of Care (Signed)

## 2021-08-13 NOTE — Progress Notes (Signed)
Pt transported from ED 6 to ICU 9 on BIPAP with no complications. Pt tolerated well

## 2021-08-13 NOTE — Progress Notes (Addendum)
Patient currently off BIPAP and on 6 lpm nasal cannula. O2 sats 97%.

## 2021-08-13 NOTE — Progress Notes (Signed)
Patient admitted to the hospital earlier this morning by Dr. Josephine Cables  Patient seen and examined.  He is feeling better.  Shortness of breath has improved since admission.  He is not having any chest pain.  He does not report any recent melena or hematochezia.  Assessment/plan  Symptomatic anemia -Admission hemoglobin noted to be 4.5, this was 7.6 approximately 2 weeks ago -He has not been taking any NSAIDs, denies any recent blood loss -Stool for FOBT noted to be negative -He is followed by oncology for macrocytic anemia with concern for underlying MDS -Discussed with Dr. Delton Coombes who requested that SPEP, haptoglobin, LDH and Coombs test be checked -He has been transfused 3 units PRBC -Follow-up hemoglobin improved to 7.4 -We will recheck in a.m., and transfuse another PRBC for hemoglobin less than 8 due to cardiac history  Flash pulmonary edema -This occurred after receiving PRBC transfusions -He received intravenous Lasix -Overall respiratory status appears to be improving -We will check echocardiogram  Acute respiratory failure with hypoxia -Required BiPAP for flash pulmonary edema -Overall oxygen has been weaned down, anticipation cough today -Continue to follow  Chronic kidney disease stage IV -Creatinine appears to be close to baseline -We will continue to monitor  Acute on chronic diastolic congestive heart failure -Secondary to volume overload in the setting of PRBC transfusions -Restart oral torsemide -He did receive intravenous Lasix overnight  Atrial fibrillation, paroxysmal -Chronically on Eliquis, on hold for now -Suspect to be reasonable to continue to hold Eliquis until he can follow-up with his cardiologist -CHA2DS2-VASc score 6 -We will discuss with cardiology  Hypertension -Continue Lopressor  Hyperlipidemia -Continue statin  Type 2 diabetes -Continue on basal insulin and sliding scale

## 2021-08-13 NOTE — ED Notes (Signed)
Pt blood decreased to 146ml per hour, admitting and resp paged, pt placed on NRB due to acute resp distress and wheezing

## 2021-08-13 NOTE — H&P (Addendum)
History and Physical  Jack Mejia ZOX:[id number] DOB: 29-Sep-1945 DOA: 08/12/2021  Referring physician: Godfrey Pick, MD PCP: Coral Spikes, DO  Patient coming from: Home  Chief Complaint: Shortness of breath  HPI: Jack Mejia is a 76 y.o. male with medical history significant for iron deficiency anemia, anemia of chronic disease (usually receives epoetin infusion iron sucrose from his oncologist with last infusion being 1/4), myelodysplastic syndrome, essential hypertension, macrocytic anemia who presents to the emergency department via EMS due to worsening shortness of breath.  Patient has been having some shortness of breath for some time, but this has since worsened.  Patient states that he gets short of breath to go from bedroom to restroom, EMS was activated and patient was brought to the ED for further evaluation and management.  He denied fever, chills, chest pain, headache vomiting of blood or black stool.  ED Course:  In the emergency department, vital signs were within normal range, though the patient required supplemental oxygen via [place] at 6 LPM to maintain O2 sat of 91-100%.  Work-up in the ED showed H/H of 4.5/13.1, this was 7.6/21.9 about 2 weeks ago.  MCV 122.4, BUN/creatinine 51/2.37 (baseline creatinine at 1.7-2.1).  Urinalysis was normal, FOBT was negative.  Influenza A, B, SARS coronavirus 2 was negative. Chest x-ray showed cardiomegaly with mild pulmonary edema and small pleural effusion Subsequent chest x-ray showed increasing changes of CHF with effusions and pulmonary edema Type and screen was done, 3 units of PRBC was ordered in the ED.  While patient was undergoing second unit of PRBC transfusion, he went into mild flash pulmonary edema, apparently torsemide 20 mg p.o. was given after first unit.  IV Lasix 20 mg was then came with only minimal urine output, patient was placed on BiPAP to help decrease preload.  Review of Systems: A full 10 point Review of Systems was  done, except as stated above, all other Review of systems were negative.  Past Medical History:  Diagnosis Date   Anxiety    Arthritis    Asthma    Atrial fibrillation ([place])    a. s/p DCCV in 03/2019   CAD (coronary artery disease) 06/29/2019   Cramps of left lower extremity    Depression    Diabetes mellitus    type 2 for 7-8 yrs   Dyspnea    Elevated liver enzymes    Fatty liver    Gout    History of kidney stones    Hyperlipidemia    Hypertension    Renal insufficiency    Vertigo    Past Surgical History:  Procedure Laterality Date   BIOPSY  10/11/2020   Procedure: BIOPSY;  Surgeon: Harvel Quale, MD;  [facility];  Service: Gastroenterology;;   CARDIOVERSION N/A 04/21/2019   Procedure: CARDIOVERSION;  Surgeon: Sanda Klein, MD;  Location: [place];  Service: Cardiovascular;  Laterality: N/A;   CARPAL TUNNEL RELEASE     both wrist   cataract surgery     bilateral   CHOLECYSTECTOMY     COLONOSCOPY  2011   COLONOSCOPY WITH PROPOFOL N/A 10/11/2020   Procedure: COLONOSCOPY WITH PROPOFOL;  Surgeon: Harvel Quale, MD;  [facility];  Service: Gastroenterology;  Laterality: N/A;  AM   ESOPHAGOGASTRODUODENOSCOPY (EGD) WITH PROPOFOL N/A 10/11/2020   Procedure: ESOPHAGOGASTRODUODENOSCOPY (EGD) WITH PROPOFOL;  Surgeon: Harvel Quale, MD;  [facility];  Service: Gastroenterology;  Laterality: N/A;   EYE SURGERY  HEMORROIDECTOMY     KNEE SURGERY Left    LEFT HEART CATH AND CORONARY ANGIOGRAPHY N/A 05/23/2019   Procedure: LEFT HEART CATH AND CORONARY ANGIOGRAPHY;  Surgeon: Belva Crome, MD;  Location: [place] [facility];  Service: Cardiovascular;  Laterality: N/A;   LUMBAR LAMINECTOMY/DECOMPRESSION MICRODISCECTOMY N/A 08/06/2016   Procedure: LUMBAR THREE- LUMBAR FIVE  DECOMPRESSIVE LUMBAR LAMINECTOMY;  Surgeon: Jovita Gamma, MD;  Location: [place];  Service: Neurosurgery;  Laterality: N/A;   POLYPECTOMY   10/11/2020   Procedure: POLYPECTOMY;  Surgeon: Harvel Quale, MD;  [facility];  Service: Gastroenterology;;    Social History:  reports that he quit smoking about 38 years ago. His smoking use included cigarettes. He has a 80.00 pack-year smoking history. He has never used smokeless tobacco. He reports that he does not drink alcohol and does not use drugs.   Allergies  Allergen Reactions   Demerol Nausea And Vomiting   Meperidine Hcl Nausea And Vomiting   Vasotec [Enalapril] Cough   Lasix [Furosemide] Rash    Family History  Problem Relation Age of Onset   Diabetes Mother    Cancer Mother        unknown kind   Diabetes Sister    COPD Sister    Liver disease Brother    COPD Brother    Diabetes Maternal Grandmother    Diabetes Brother    COPD Sister    Healthy Daughter    Healthy Daughter    Colon cancer Neg Hx      Prior to Admission medications   Medication Sig Start Date End Date Taking? Authorizing Provider  acetaminophen (TYLENOL) 650 MG CR tablet Take 650 mg by mouth every 8 (eight) hours as needed for pain. Patient not taking: Reported on 07/30/2021    [provider]  allopurinol (ZYLOPRIM) 300 MG tablet TAKE 1 TABLET (300 MG TOTAL) BY MOUTH DAILY. START THIS AFTER GOUT FLARE. 07/01/21   Coral Spikes, DO  apixaban (ELIQUIS) 5 MG TABS tablet Take 1 tablet (5 mg total) by mouth 2 (two) times daily. 02/17/21   Verta Ellen., NP  atorvastatin (LIPITOR) 20 MG tablet TAKE 1 TABLET BY MOUTH EVERY DAY 12/30/20   Verta Ellen., NP  augmented betamethasone dipropionate (DIPROLENE-AF) 0.05 % cream Apply topically. 06/30/21   [provider]  Janne Lab Oil (VENELEX) OINT Apply 1 application topically 3 (three) times daily as needed (irritation). Apply to sacrum and bilateral buttocks Patient not taking: Reported on 07/30/2021    [provider]  BD PEN NEEDLE NANO 2ND GEN 32G X 4 MM MISC Use with insulin pen qid.  08/23/20   Elvia Collum M, DO  clotrimazole-betamethasone (LOTRISONE) cream Apply 1 application topically 2 (two) times daily. Patient taking differently: Apply 1 application topically 2 (two) times daily as needed (irritation). 08/29/20   McKenzie, Candee Furbish, MD  colchicine 0.6 MG tablet TAKE 1 TABLET BY MOUTH EVERY DAY AS NEEDED FOR GOUT FLARE 07/30/21   Thersa Salt G, DO  diltiazem (CARDIZEM CD) 360 MG 24 hr capsule TAKE 1 CAPSULE BY MOUTH EVERY DAY 10/30/20   Verta Ellen., NP  empagliflozin (JARDIANCE) 10 MG TABS tablet Take 1 tablet (10 mg total) by mouth daily before breakfast. 06/25/21   Coral Spikes, DO  ferrous sulfate 325 (65 FE) MG tablet Take 325 mg by mouth. Takes one every other day    [provider]  LEVEMIR FLEXTOUCH 100 UNIT/ML FlexTouch Pen  Inject 24 Units into the skin at bedtime. 07/08/21   Coral Spikes, DO  metoprolol tartrate (LOPRESSOR) 25 MG tablet Take 1 tablet (25 mg total) by mouth 2 (two) times daily. 02/27/21   Lovena Le, Malena M, DO  NOVOLOG FLEXPEN 100 UNIT/ML FlexPen INJECT 15 UNITS INTO THE SKIN 3 (THREE) TIMES DAILY WITH MEALS 11/25/20   Lovena Le, Malena M, DO  ONETOUCH VERIO test strip TEST TWICE DAILY AS DIRECTED 03/28/21   Elvia Collum M, DO  potassium chloride (KLOR-CON) 10 MEQ tablet TAKE 1 TABLET BY MOUTH EVERY DAY 06/06/21   Coral Spikes, DO  sertraline (ZOLOFT) 100 MG tablet Take 1 tab p.o. qd 02/27/21   Elvia Collum M, DO  torsemide (DEMADEX) 20 MG tablet TAKE 1 TABLET BY MOUTH TWICE A DAY 03/19/21   Verta Ellen., NP  traMADol Veatrice Bourbon) 50 MG tablet Take 1-2 tab p.o. q8hrs prn pain 02/27/21   Erven Colla, DO    Physical Exam: BP 119/65    Pulse 89    Temp 99.1 F (37.3 C) (Oral)    Resp (!) 23    Ht 5\' 9"  (1.753 m)    Wt 85.9 kg    SpO2 96%    BMI 27.97 kg/m   General: 76 y.o. year-old male well developed well nourished in no acute distress.  Alert and oriented x3. HEENT: NCAT, EOMI, pale conjunctiva Neck: Supple, trachea  medial Cardiovascular: Regular rate and rhythm with no rubs or gallops.  No thyromegaly or JVD noted.  No lower extremity edema. 2/4 pulses in all 4 extremities. Respiratory: Intermittent tachypnea.  Bilateral mild rales in lower lobes.   Abdomen: Soft, nontender nondistended with normal bowel sounds x4 quadrants. Muskuloskeletal: No cyanosis, clubbing or edema noted bilaterally Neuro: CN II-XII intact, strength 5/5 x 4, sensation, reflexes intact Skin: No ulcerative lesions noted or rashes Psychiatry: Judgement and insight appear normal. Mood is appropriate for condition and setting          Labs on Admission:  Basic Metabolic Panel: Recent Labs  Lab 08/12/21 2122  NA 134*  K 3.9  CL 100  CO2 19*  GLUCOSE 244*  BUN 51*  CREATININE 2.37*  CALCIUM 9.0   Liver Function Tests: Recent Labs  Lab 08/12/21 2122  AST 20  ALT 16  ALKPHOS 120  BILITOT 1.6*  PROT 7.0  ALBUMIN 4.2   Recent Labs  Lab 08/12/21 2122  LIPASE 35   No results for input(s): AMMONIA in the last 168 hours. CBC: Recent Labs  Lab 08/12/21 2122  WBC 3.6*  HGB 4.5*  HCT 13.1*  MCV 122.4*  PLT 230   Cardiac Enzymes: No results for input(s): CKTOTAL, CKMB, CKMBINDEX, TROPONINI in the last 168 hours.  BNP (last 3 results) No results for input(s): BNP in the last 8760 hours.  ProBNP (last 3 results) No results for input(s): PROBNP in the last 8760 hours.  CBG: No results for input(s): GLUCAP in the last 168 hours.  Radiological Exams on Admission: DG Chest Port 1 View  Result Date: 08/13/2021 CLINICAL DATA:  Hypoxia and weakness, initial encounter EXAM: PORTABLE CHEST 1 VIEW COMPARISON:  08/12/2021 FINDINGS: Cardiac shadow remains enlarged. Increasing vascular congestion is noted with evidence of pulmonary edema and small effusions bilaterally. No focal confluent infiltrate is seen. No bony abnormality is noted. IMPRESSION: Increasing changes of CHF with effusions and pulmonary edema.  Electronically Signed   By: Inez Catalina M.D.   On: 08/13/2021 03:34  DG Chest Portable 1 View  Result Date: 08/12/2021 CLINICAL DATA:  Shortness of breath. EXAM: PORTABLE CHEST 1 VIEW COMPARISON:  Chest x-ray 03/04/2021. FINDINGS: The heart is enlarged. There central pulmonary vascular congestion. There are central interstitial opacities throughout both lungs. There are patchy airspace opacities in the left lung base. There are small bilateral pleural effusions. There is no pneumothorax or acute fracture. IMPRESSION: 1. Cardiomegaly with mild pulmonary edema and small pleural effusions. Electronically Signed   By: Ronney Asters M.D.   On: 08/12/2021 23:47    EKG: I independently viewed the EKG done and my findings are as followed: Normal sinus rhythm at a rate of 77 bpm with multiple PVCs  Assessment/Plan Present on Admission:  Symptomatic anemia  Essential hypertension  Anemia of chronic disease  Principal Problem:   Symptomatic anemia Active Problems:   Essential hypertension   Anemia of chronic disease   Acute kidney injury superimposed on CKD (HCC)   Type 2 diabetes mellitus (HCC)   Flash pulmonary edema (HCC)   Iron deficiency anemia  Symptomatic anemia H/H of 4.5/13.1, this was 7.6/21.9 about 2 weeks ago.  Patient denies use of NSAIDs, Goody powder etc. Hemoccult was negative, though this was positive on 1/9 from occult blood testing done at the oncologist's office Type and crossmatch was done 3 units of PRBC was ordered and started in the ED  Patient may require outpatient gastroenterology  follow up Continue ferrous sulfate  Flash Pulmonary edema After blood transfusion started, patient developed increased shortness of breath, chest x-ray was done and showed increasing changes of CHF with effusions and pulmonary edema Patient was placed on BiPAP IV Lasix 20 mg after each unit of PRBC was ordered Patient continue to monitor patient and wean off BiPAP as  tolerated Continue total input/output, daily weights and fluid restriction Continue Cardiac diet  Echocardiogram done on 10/23/2020 showed LVEF of 60 to 65%.  LV is normal and it has no RWMA.  LV diastolic parameters consistent with G1 DD  Anemia of chronic disease Patient follows with the oncology center He receives epoetin and iron infusion with last infusion being on 07/30/2021  Acute kidney injury on CKD 4 BUN/creatinine 51/2.37 (baseline creatinine at 1.7-2.1) IV hydration cannot be provided at this time due to flash pulmonary edema Renally adjust medications, avoid nephrotoxic agents/dehydration/hypotension  Hyperglycemia secondary to T2DM Continue ISS and hypoglycemic protocol Continue Semglee 10 units nightly and adjust dose accordingly  Essential hypertension (controlled) Continue Lopressor   Hyperlipidemia Continue Lipitor  DVT prophylaxis: SCDs  Code Status: Full code  Family Communication: None at bedside  Disposition Plan:  Patient is from:                        home Anticipated DC to:                   SNF or family members home Anticipated DC date:               2-3 days Anticipated DC barriers:         Patient requires inpatient management continue to severity of illness    Consults called: None  status: Observation    Bernadette Hoit MD [facility]  08/13/2021, 8:26 AM

## 2021-08-13 NOTE — ED Notes (Signed)
Pt received majority of second unit of blood, unable to tolerate due to resp status, blood products stopped for diuresis.

## 2021-08-13 NOTE — ED Notes (Addendum)
Pt placed on bipap, blood paused

## 2021-08-13 NOTE — Progress Notes (Signed)
Oncology Discharge Planning Admission Note  [facility] Address: [address], [place] [address] Hours of Operation:  8am - 5pm, Monday - Friday  Clinic Contact Information:  [phone]  Oncology Care Team: Medical Oncologist:  Derek Jack, followed for Hematology by Tarri Abernethy, [place] in office.  Contacted Tarri Abernethy, [place] and made aware of this hospital admission dated 08/12/21, and the cancer center will follow Mliss Sax inpatient care to assist with discharge planning as indicated by the oncologist.  We will reach out closer to discharge date to arrange  follow up care.  Disclaimer:  This [place] note does not imply a formal consult request has been made by the admitting attending for this admission or there will be an inpatient consult completed by oncology.  Please request oncology consults as per standard process as indicated.

## 2021-08-14 ENCOUNTER — Other Ambulatory Visit (HOSPITAL_COMMUNITY): Payer: Self-pay | Admitting: *Deleted

## 2021-08-14 ENCOUNTER — Ambulatory Visit: Payer: Medicare HMO | Admitting: Cardiology

## 2021-08-14 ENCOUNTER — Inpatient Hospital Stay (HOSPITAL_COMMUNITY): Payer: Medicare HMO

## 2021-08-14 DIAGNOSIS — I5033 Acute on chronic diastolic (congestive) heart failure: Secondary | ICD-10-CM

## 2021-08-14 LAB — COMPREHENSIVE METABOLIC PANEL
ALT: 27 U/L (ref 0–44)
AST: 25 U/L (ref 15–41)
Albumin: 3.6 g/dL (ref 3.5–5.0)
Alkaline Phosphatase: 90 U/L (ref 38–126)
Anion gap: 12 (ref 5–15)
BUN: 50 mg/dL — ABNORMAL HIGH (ref 8–23)
CO2: 23 mmol/L (ref 22–32)
Calcium: 8.5 mg/dL — ABNORMAL LOW (ref 8.9–10.3)
Chloride: 102 mmol/L (ref 98–111)
Creatinine, Ser: 1.92 mg/dL — ABNORMAL HIGH (ref 0.61–1.24)
GFR, Estimated: 36 mL/min — ABNORMAL LOW (ref >60)
Glucose, Bld: 175 mg/dL — ABNORMAL HIGH (ref 70–99)
Potassium: 3.4 mmol/L — ABNORMAL LOW (ref 3.5–5.1)
Sodium: 137 mmol/L (ref 135–145)
Total Bilirubin: 2 mg/dL — ABNORMAL HIGH (ref 0.3–1.2)
Total Protein: 6.1 g/dL — ABNORMAL LOW (ref 6.5–8.1)

## 2021-08-14 LAB — CBC
HCT: 22.1 % — ABNORMAL LOW (ref 39.0–52.0)
Hemoglobin: 7.5 g/dL — ABNORMAL LOW (ref 13.0–17.0)
MCH: 33.8 pg (ref 26.0–34.0)
MCHC: 33.9 g/dL (ref 30.0–36.0)
MCV: 99.5 fL (ref 80.0–100.0)
Platelets: 202 10*3/uL (ref 150–400)
RBC: 2.22 MIL/uL — ABNORMAL LOW (ref 4.22–5.81)
RDW: 22.5 % — ABNORMAL HIGH (ref 11.5–15.5)
WBC: 3.2 10*3/uL — ABNORMAL LOW (ref 4.0–10.5)
nRBC: 0 % (ref 0.0–0.2)

## 2021-08-14 LAB — GLUCOSE, CAPILLARY
Glucose-Capillary: 165 mg/dL — ABNORMAL HIGH (ref 70–99)
Glucose-Capillary: 184 mg/dL — ABNORMAL HIGH (ref 70–99)
Glucose-Capillary: 233 mg/dL — ABNORMAL HIGH (ref 70–99)
Glucose-Capillary: 143 mg/dL — ABNORMAL HIGH (ref 70–99)

## 2021-08-14 LAB — ECHOCARDIOGRAM COMPLETE
AR max vel: 1.01 cm2
AV Area VTI: 1.07 cm2
AV Area mean vel: 0.96 cm2
AV Mean grad: 24 mmHg
AV Peak grad: 44 mmHg
Ao pk vel: 3.32 m/s
Area-P 1/2: 3.42 cm2
Calc EF: 55.9 %
Height: 69 in
MV VTI: 2.33 cm2
S' Lateral: 3.1 cm
Single Plane A2C EF: 55.8 %
Single Plane A4C EF: 55.9 %
Weight: 2987.67 oz

## 2021-08-14 LAB — HEMOGLOBIN AND HEMATOCRIT, BLOOD
HCT: 25 % — ABNORMAL LOW (ref 39.0–52.0)
Hemoglobin: 8.7 g/dL — ABNORMAL LOW (ref 13.0–17.0)

## 2021-08-14 LAB — PHOSPHORUS: Phosphorus: 3.7 mg/dL (ref 2.5–4.6)

## 2021-08-14 LAB — PREPARE RBC (CROSSMATCH)

## 2021-08-14 LAB — MAGNESIUM: Magnesium: 2.6 mg/dL — ABNORMAL HIGH (ref 1.7–2.4)

## 2021-08-14 LAB — HAPTOGLOBIN: Haptoglobin: 136 mg/dL (ref 34–355)

## 2021-08-14 MED ORDER — SODIUM CHLORIDE 0.9% IV SOLUTION: Freq: Once | INTRAVENOUS | Status: DC

## 2021-08-14 MED ORDER — FUROSEMIDE 10 MG/ML IJ SOLN
20.0000 mg | Freq: Once | INTRAMUSCULAR | Status: AC
  Administered 2021-08-14: 20 mg via INTRAVENOUS
  Filled 2021-08-14: qty 2

## 2021-08-14 NOTE — Evaluation (Signed)
Physical Therapy Evaluation Patient Details Name: Jack Mejia MRN: [id number] DOB: 03/09/1946 Today's Date: 08/14/2021  History of Present Illness  Jack Mejia is a 76 y.o. male with medical history significant for iron deficiency anemia, anemia of chronic disease (usually receives epoetin infusion iron sucrose from his oncologist with last infusion being 1/4), myelodysplastic syndrome, essential hypertension, macrocytic anemia who presents to the emergency department via EMS due to worsening shortness of breath.  Patient has been having some shortness of breath for some time, but this has since worsened.  Patient states that he gets short of breath to go from bedroom to restroom, EMS was activated and patient was brought to the ED for further evaluation and management.  He denied fever, chills, chest pain, headache vomiting of blood or black stool.   Clinical Impression  Patient functioning at baseline for functional mobility and gait demonstrating good return for ambulation in room and hallways without loss of balance.  Patient encouraged to ambulate with nursing staff as tolerated for length of stay - RN notified.  Plan:  Patient discharged from physical therapy to care of nursing for ambulation daily as tolerated for length of stay.         Recommendations for follow up therapy are one component of a multi-disciplinary discharge planning process, led by the attending physician.  Recommendations may be updated based on patient status, additional functional criteria and insurance authorization.  Follow Up Recommendations No PT follow up    Assistance Recommended at Discharge PRN  Patient can return home with the following  Other (comment) (near baseline)    Equipment Recommendations None recommended by PT  Recommendations for Other Services       Functional Status Assessment Patient has not had a recent decline in their functional status     Precautions / Restrictions  Precautions Precautions: None Restrictions Weight Bearing Restrictions: No      Mobility  Bed Mobility Overal bed mobility: Modified Independent                  Transfers Overall transfer level: Modified independent                      Ambulation/Gait Ambulation/Gait assistance: Modified independent (Device/Increase time) Gait Distance (Feet): 100 Feet Assistive device: None Gait Pattern/deviations: WFL(Within Functional Limits) Gait velocity: decreased     General Gait Details: grossly WFL with good return for ambulation in room and hallway without loss of balance while on room air with SpO2 at 94%  Stairs            Wheelchair Mobility    Modified Rankin (Stroke Patients Only)       Balance Overall balance assessment: No apparent balance deficits (not formally assessed)                                           Pertinent Vitals/Pain Pain Assessment Pain Assessment: No/denies pain    Home Living Family/patient expects to be discharged to:: Private residence Living Arrangements: Spouse/significant other Available Help at Discharge: Family;Available PRN/intermittently Type of Home: House Home Access: Level entry;Stairs to enter Entrance Stairs-Rails: Right;Left;Can reach both Entrance Stairs-Number of Steps: 4   Home Layout: One level Home Equipment: Conservation officer, nature (2 wheels);Cane - single point;Shower seat - built in      Prior Function Prior Level of Function : Independent/Modified Independent  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 ADLs Comments: Independent     Hand Dominance   Dominant Hand: Right    Extremity/Trunk Assessment   Upper Extremity Assessment Upper Extremity Assessment: Overall WFL for tasks assessed    Lower Extremity Assessment Lower Extremity Assessment: Overall WFL for tasks assessed    Cervical / Trunk Assessment Cervical / Trunk Assessment: Normal   Communication   Communication: HOH  Cognition Arousal/Alertness: Awake/alert Behavior During Therapy: WFL for tasks assessed/performed Overall Cognitive Status: Within Functional Limits for tasks assessed                                          General Comments      Exercises     Assessment/Plan    PT Assessment Patient does not need any further PT services  PT Problem List         PT Treatment Interventions      PT Goals (Current goals can be found in the Care Plan section)  Acute Rehab PT Goals Patient Stated Goal: return home with family to assist PT Goal Formulation: With patient Time For Goal Achievement: 08/14/21 Potential to Achieve Goals: Good    Frequency       Co-evaluation               AM-PAC PT "6 Clicks" Mobility  Outcome Measure Help needed turning from your back to your side while in a flat bed without using bedrails?: None Help needed moving from lying on your back to sitting on the side of a flat bed without using bedrails?: None Help needed moving to and from a bed to a chair (including a wheelchair)?: None Help needed standing up from a chair using your arms (e.g., wheelchair or bedside chair)?: None Help needed to walk in hospital room?: None Help needed climbing 3-5 steps with a railing? : None 6 Click Score: 24    End of Session   Activity Tolerance: Patient tolerated treatment well;Patient limited by fatigue Patient left: in chair;with call bell/phone within reach Nurse Communication: Mobility status PT Visit Diagnosis: Unsteadiness on feet (R26.81);Other abnormalities of gait and mobility (R26.89);Muscle weakness (generalized) (M62.81)    Time: 1610-9604 PT Time Calculation (min) (ACUTE ONLY): 22 min   Charges:   PT Evaluation $PT Eval Moderate Complexity: 1 Mod PT Treatments $Therapeutic Activity: 8-22 mins        2:27 PM, 08/14/21 Lonell Grandchild, MPT Physical Therapist with [facility] [phone] office [phone] mobile phone

## 2021-08-14 NOTE — Progress Notes (Signed)
PROGRESS NOTE    Jack Mejia  CWC:[id number] DOB: [birth year] DOA: 08/12/2021 PCP: Coral Spikes, DO    Brief Narrative:  76 year old male with a history of macrocytic anemia, myelodysplastic syndrome, hypertension, diastolic heart failure, chronic kidney disease stage IV, admitted to the hospital with shortness of breath.  Found to have a hemoglobin of 4.5.  He did not have any evidence of GI bleed.  He was transfused 3 units of PRBC and developed flash pulmonary edema.  Briefly required BiPAP, respiratory status has improved with diuresis.  He has been transfused another unit of PRBC today in order to keep hemoglobin greater than 8.  Anticipate discharge in next 24 hours if he continues to make improvements.   Assessment & Plan:   Principal Problem:   Symptomatic anemia Active Problems:   Essential hypertension   Anemia of chronic disease   Acute kidney injury superimposed on CKD (HCC)   Type 2 diabetes mellitus (HCC)   Flash pulmonary edema (HCC)   Iron deficiency anemia   Hyperglycemia due to diabetes mellitus (HCC)   Symptomatic anemia -Admission hemoglobin noted to be 4.5, this was 7.6 approximately 2 weeks ago -He has not been taking any NSAIDs, denies any recent blood loss -Stool for FOBT noted to be negative -He is followed by oncology for macrocytic anemia with concern for underlying MDS -Discussed with Dr. Delton Coombes who requested that SPEP, haptoglobin, LDH and Coombs test be checked -He has been transfused 3 units PRBC -Follow-up hemoglobin improved to 7.4 -Discussed with oncology who recommended keeping hemoglobin greater than 8.  He will be transfused 1 more unit of PRBC today.  We will transfuse at a very slow rate source not precipitate recurrent pulmonary edema.  Flash pulmonary edema -This occurred after receiving PRBC transfusions -He received intravenous Lasix -Overall respiratory status appears to be improving -Echocardiogram shows preserved ejection  fraction  Acute respiratory failure with hypoxia -Required BiPAP for flash pulmonary edema -Overall oxygen has been weaned down and today noted to be on room air -Continue to follow  Chronic kidney disease stage IV -Creatinine appears to be close to baseline -We will continue to monitor  Acute on chronic diastolic congestive heart failure -Secondary to volume overload in the setting of PRBC transfusions -Restart oral torsemide -Overall volume status appears to have improved and oxygen is being weaned off.  Atrial fibrillation, paroxysmal -Chronically on Eliquis, on hold for now -Suspect to be reasonable to continue to hold Eliquis until he can follow-up with his cardiologist -CHA2DS2-VASc score 6 -Discussed with Dr. Harl Bowie, cardiology who also agreed on holding Eliquis for now until he can follow-up with his primary cardiologist.  Hypertension -Continue Lopressor  Hyperlipidemia -Continue statin  Type 2 diabetes -Continue on basal insulin and sliding scale   DVT prophylaxis: SCDs Start: 08/13/21 1142  Code Status: DNR Family Communication: Discussed with wife at the bedside Disposition Plan: Status is: Inpatient  Remains inpatient appropriate because: Continue transfusion PRBC         Consultants:    Procedures:  Echocardiogram  Antimicrobials:      Subjective: He is feeling better today.  He is not short of breath on ambulation.  Objective: Vitals:   08/14/21 1730 08/14/21 1800 08/14/21 1830 08/14/21 1900  BP: 127/63 121/62    Pulse: 87 85 83 88  Resp: 19 15 16  (!) 26  Temp: 98.2 F (36.8 C)  98.4 F (36.9 C)   TempSrc: Oral  Oral   SpO2: 90% 98% 98% 98%  Weight:      Height:        Intake/Output Summary (Last 24 hours) at 08/14/2021 1912 Last data filed at 08/14/2021 1300 Gross per 24 hour  Intake 730 ml  Output 600 ml  Net 130 ml   Filed Weights   08/12/21 2049 08/13/21 0400 08/14/21 0406  Weight: 83 kg 85.9 kg 84.7 kg     Examination:  General exam: Appears calm and comfortable  Respiratory system: Clear to auscultation. Respiratory effort normal. Cardiovascular system: S1 & S2 heard, RRR. No JVD, murmurs, rubs, gallops or clicks. No pedal edema. Gastrointestinal system: Abdomen is nondistended, soft and nontender. No organomegaly or masses felt. Normal bowel sounds heard. Central nervous system: Alert and oriented. No focal neurological deficits. Extremities: Symmetric 5 x 5 power. Skin: No rashes, lesions or ulcers Psychiatry: Judgement and insight appear normal. Mood & affect appropriate.     Data Reviewed: I have personally reviewed following labs and imaging studies  CBC: Recent Labs  Lab 08/12/21 2122 08/13/21 1341 08/14/21 0437  WBC 3.6* 2.9* 3.2*  HGB 4.5* 7.8* 7.5*  HCT 13.1* 22.2* 22.1*  MCV 122.4* 100.5* 99.5  PLT 230 205 329   Basic Metabolic Panel: Recent Labs  Lab 08/12/21 2122 08/13/21 1341 08/14/21 0437  NA 134* 134* 137  K 3.9 3.5 3.4*  CL 100 100 102  CO2 19* 21* 23  GLUCOSE 244* 231* 175*  BUN 51* 51* 50*  CREATININE 2.37* 2.22* 1.92*  CALCIUM 9.0 8.3* 8.5*  MG  --   --  2.6*  PHOS  --   --  3.7   GFR: Estimated Creatinine Clearance: 33.2 mL/min (A) (by C-G formula based on SCr of 1.92 mg/dL (H)). Liver Function Tests: Recent Labs  Lab 08/12/21 2122 08/14/21 0437  AST 20 25  ALT 16 27  ALKPHOS 120 90  BILITOT 1.6* 2.0*  PROT 7.0 6.1*  ALBUMIN 4.2 3.6   Recent Labs  Lab 08/12/21 2122  LIPASE 35   No results for input(s): AMMONIA in the last 168 hours. Coagulation Profile: No results for input(s): INR, PROTIME in the last 168 hours. Cardiac Enzymes: No results for input(s): CKTOTAL, CKMB, CKMBINDEX, TROPONINI in the last 168 hours. BNP (last 3 results) No results for input(s): PROBNP in the last 8760 hours. HbA1C: Recent Labs    08/12/21 2122  HGBA1C 6.9*   CBG: Recent Labs  Lab 08/13/21 2158 08/13/21 2252 08/14/21 0742  08/14/21 1124 08/14/21 1605  GLUCAP 178* 169* 165* 184* 233*   Lipid Profile: No results for input(s): CHOL, HDL, LDLCALC, TRIG, CHOLHDL, LDLDIRECT in the last 72 hours. Thyroid Function Tests: No results for input(s): TSH, T4TOTAL, FREET4, T3FREE, THYROIDAB in the last 72 hours. Anemia Panel: Recent Labs    08/12/21 2122  RETICCTPCT 0.5   Sepsis Labs: No results for input(s): PROCALCITON, LATICACIDVEN in the last 168 hours.  Recent Results (from the past 240 hour(s))  Resp Panel by RT-PCR (Flu A&B, Covid) Nasopharyngeal Swab     Status: None   Collection Time: 08/13/21  1:23 AM   Specimen: Nasopharyngeal Swab; Nasopharyngeal(NP) swabs in vial transport medium  Result Value Ref Range Status   SARS Coronavirus 2 by RT PCR NEGATIVE NEGATIVE Final    Comment: (NOTE) SARS-CoV-2 target nucleic acids are NOT DETECTED.  The SARS-CoV-2 RNA is generally detectable in upper respiratory specimens during the acute phase of infection. The lowest concentration of SARS-CoV-2 viral copies this assay can detect is 138 copies/mL. A negative result  does not preclude SARS-Cov-2 infection and should not be used as the sole basis for treatment or other patient management decisions. A negative result may occur with  improper specimen collection/handling, submission of specimen other than nasopharyngeal swab, presence of viral mutation(s) within the areas targeted by this assay, and inadequate number of viral copies(<138 copies/mL). A negative result must be combined with clinical observations, patient history, and epidemiological information. The expected result is Negative.  Fact Sheet for Patients:  EntrepreneurPulse.com.au  Fact Sheet for Healthcare Providers:  IncredibleEmployment.be  This test is no t yet approved or cleared by the [place] FDA and  has been authorized for detection and/or diagnosis of SARS-CoV-2 by FDA under an Emergency Use  Authorization (EUA). This EUA will remain  in effect (meaning this test can be used) for the duration of the COVID-19 declaration under Section 564(b)(1) of the Act, 21 U.S.C.section 360bbb-3(b)(1), unless the authorization is terminated  or revoked sooner.       Influenza A by PCR NEGATIVE NEGATIVE Final   Influenza B by PCR NEGATIVE NEGATIVE Final    Comment: (NOTE) The Xpert Xpress SARS-CoV-2/FLU/RSV plus assay is intended as an aid in the diagnosis of influenza from Nasopharyngeal swab specimens and should not be used as a sole basis for treatment. Nasal washings and aspirates are unacceptable for Xpert Xpress SARS-CoV-2/FLU/RSV testing.  Fact Sheet for Patients: EntrepreneurPulse.com.au  Fact Sheet for Healthcare Providers: IncredibleEmployment.be  This test is not yet approved or cleared by the [place] FDA and has been authorized for detection and/or diagnosis of SARS-CoV-2 by FDA under an Emergency Use Authorization (EUA). This EUA will remain in effect (meaning this test can be used) for the duration of the COVID-19 declaration under Section 564(b)(1) of the Act, 21 U.S.C. section 360bbb-3(b)(1), unless the authorization is terminated or revoked.  Performed at [facility], [address] [place], [place] [address]   MRSA Next Gen by PCR, Nasal     Status: None   Collection Time: 08/13/21  3:57 AM   Specimen: Nasal Mucosa; Nasal Swab  Result Value Ref Range Status   MRSA by PCR Next Gen NOT DETECTED NOT DETECTED Final    Comment: (NOTE) The GeneXpert MRSA Assay (FDA approved for NASAL specimens only), is one component of a comprehensive MRSA colonization surveillance program. It is not intended to diagnose MRSA infection nor to guide or monitor treatment for MRSA infections. Test performance is not FDA approved in patients less than 39 years old. Performed at [facility], [address], [place], [place] [address]           Radiology Studies: [facility] Chest [facility] 1 View  Result Date: 08/13/2021 CLINICAL DATA:  Hypoxia and weakness, initial encounter EXAM: PORTABLE CHEST 1 VIEW COMPARISON:  08/12/2021 FINDINGS: Cardiac shadow remains enlarged. Increasing vascular congestion is noted with evidence of pulmonary edema and small effusions bilaterally. No focal confluent infiltrate is seen. No bony abnormality is noted. IMPRESSION: Increasing changes of CHF with effusions and pulmonary edema. Electronically Signed   By: Inez Catalina M.D.   On: 08/13/2021 03:34   DG Chest Portable 1 View  Result Date: 08/12/2021 CLINICAL DATA:  Shortness of breath. EXAM: PORTABLE CHEST 1 VIEW COMPARISON:  Chest x-ray 03/04/2021. FINDINGS: The heart is enlarged. There central pulmonary vascular congestion. There are central interstitial opacities throughout both lungs. There are patchy airspace opacities in the left lung base. There are small bilateral pleural effusions. There is no pneumothorax or acute fracture. IMPRESSION: 1. Cardiomegaly  with mild pulmonary edema and small pleural effusions. Electronically Signed   By: Ronney Asters M.D.   On: 08/12/2021 23:47   ECHOCARDIOGRAM COMPLETE  Result Date: 08/14/2021    ECHOCARDIOGRAM REPORT   Patient Name:   MATHAN DARROCH Date of Exam: 08/14/2021 Medical Rec #:  [id number]       Height:       69.0 in Accession #:    [id number]      Weight:       186.7 lb Date of Birth:  08-Nov-1945      BSA:          2.007 m Patient Age:    56 years        BP:           126/67 mmHg Patient Gender: M               HR:           80 bpm. Exam Location:  [facility] Procedure: 2D Echo, Cardiac Doppler and Color Doppler Indications:    CHF  History:        Patient has prior history of Echocardiogram examinations, most                 recent 09/26/2020. CHF, CAD, Arrythmias:Atrial Fibrillation,                 Signs/Symptoms:Shortness of Breath; Risk Factors:Hypertension,                 Diabetes, Dyslipidemia and Former  Smoker.  Sonographer:    Wenda Low Referring Phys: [place]  1. Left ventricular ejection fraction, by estimation, is 55 to 60%. The left ventricle has normal function. The left ventricle has no regional wall motion abnormalities. There is moderate left ventricular hypertrophy. Left ventricular diastolic parameters are indeterminate. Elevated left atrial pressure.  2. Right ventricular systolic function is normal. The right ventricular size is normal. There is mildly elevated pulmonary artery systolic pressure.  3. Left atrial size was mildly dilated.  4. The mitral valve is normal in structure. Mild mitral valve regurgitation. No evidence of mitral stenosis.  5. The aortic valve is tricuspid. There is moderate calcification of the aortic valve. There is moderate thickening of the aortic valve. Aortic valve regurgitation is not visualized. Moderate aortic valve stenosis.  6. The inferior vena cava is normal in size with greater than 50% respiratory variability, suggesting right atrial pressure of 3 mmHg. FINDINGS  Left Ventricle: Left ventricular ejection fraction, by estimation, is 55 to 60%. The left ventricle has normal function. The left ventricle has no regional wall motion abnormalities. The left ventricular internal cavity size was normal in size. There is  moderate left ventricular hypertrophy. Left ventricular diastolic parameters are indeterminate. Elevated left atrial pressure. Right Ventricle: The right ventricular size is normal. No increase in right ventricular wall thickness. Right ventricular systolic function is normal. There is mildly elevated pulmonary artery systolic pressure. The tricuspid regurgitant velocity is 3.07  m/s, and with an assumed right atrial pressure of 3 mmHg, the estimated right ventricular systolic pressure is 81.0 mmHg. Left Atrium: Left atrial size was mildly dilated. Right Atrium: Right atrial size was normal in size. Pericardium: There is no  evidence of pericardial effusion. Mitral Valve: The mitral valve is normal in structure. Mild mitral valve regurgitation. No evidence of mitral valve stenosis. MV peak gradient, 6.2 mmHg. The mean mitral valve gradient is 3.0 mmHg. Tricuspid Valve: The  tricuspid valve is normal in structure. Tricuspid valve regurgitation is mild . No evidence of tricuspid stenosis. Aortic Valve: The aortic valve is tricuspid. There is moderate calcification of the aortic valve. There is moderate thickening of the aortic valve. There is moderate aortic valve annular calcification. Aortic valve regurgitation is not visualized. Moderate aortic stenosis is present. Aortic valve mean gradient measures 24.0 mmHg. Aortic valve peak gradient measures 44.0 mmHg. Aortic valve area, by VTI measures 1.07 cm. Pulmonic Valve: The pulmonic valve was not well visualized. Pulmonic valve regurgitation is not visualized. No evidence of pulmonic stenosis. Aorta: The aortic root is normal in size and structure. Venous: The inferior vena cava is normal in size with greater than 50% respiratory variability, suggesting right atrial pressure of 3 mmHg. IAS/Shunts: No atrial level shunt detected by color flow Doppler.  LEFT VENTRICLE PLAX 2D LVIDd:         5.00 cm     Diastology LVIDs:         3.10 cm     LV e' medial:    5.66 cm/s LV PW:         1.30 cm     LV E/e' medial:  20.3 LV IVS:        1.30 cm     LV e' lateral:   7.83 cm/s LVOT diam:     2.00 cm     LV E/e' lateral: 14.7 LV SV:         81 LV SV Index:   40 LVOT Area:     3.14 cm  LV Volumes (MOD) LV vol d, MOD A2C: 58.1 ml LV vol d, MOD A4C: 43.1 ml LV vol s, MOD A2C: 25.7 ml LV vol s, MOD A4C: 19.0 ml LV SV MOD A2C:     32.4 ml LV SV MOD A4C:     43.1 ml LV SV MOD BP:      28.9 ml RIGHT VENTRICLE RV Basal diam:  3.45 cm RV Mid diam:    3.30 cm RV S prime:     13.20 cm/s TAPSE (M-mode): 2.5 cm LEFT ATRIUM             Index        RIGHT ATRIUM           Index LA diam:        4.10 cm 2.04 cm/m    RA Area:     18.70 cm LA Vol (A2C):   50.6 ml 25.22 ml/m  RA Volume:   50.90 ml  25.37 ml/m LA Vol (A4C):   74.2 ml 36.98 ml/m LA Biplane Vol: 64.8 ml 32.29 ml/m  AORTIC VALVE                     PULMONIC VALVE AV Area (Vmax):    1.01 cm      PV Vmax:       0.89 m/s AV Area (Vmean):   0.96 cm      PV Peak grad:  3.1 mmHg AV Area (VTI):     1.07 cm AV Vmax:           331.50 cm/s AV Vmean:          225.500 cm/s AV VTI:            0.756 m AV Peak Grad:      44.0 mmHg AV Mean Grad:      24.0 mmHg LVOT Vmax:  106.95 cm/s LVOT Vmean:        69.150 cm/s LVOT VTI:          0.258 m LVOT/AV VTI ratio: 0.34  AORTA Ao Root diam: 3.20 cm Ao Asc diam:  2.80 cm MITRAL VALVE                TRICUSPID VALVE MV Area (PHT): 3.42 cm     TR Peak grad:   37.7 mmHg MV Area VTI:   2.33 cm     TR Vmax:        307.00 cm/s MV Peak grad:  6.2 mmHg MV Mean grad:  3.0 mmHg     SHUNTS MV Vmax:       1.25 m/s     Systemic VTI:  0.26 m MV Vmean:      84.3 cm/s    Systemic Diam: 2.00 cm MV Decel Time: 222 msec MV E velocity: 115.00 cm/s MV A velocity: 116.00 cm/s MV E/A ratio:  0.99 Carlyle Dolly MD Electronically signed by Carlyle Dolly MD Signature Date/Time: 08/14/2021/1:12:54 PM    Final         Scheduled Meds:  sodium chloride   Intravenous Once   atorvastatin  20 mg Oral q1800   Chlorhexidine Gluconate Cloth  6 each Topical Daily   ferrous sulfate  325 mg Oral Q breakfast   furosemide  20 mg Intravenous Once   insulin aspart  0-5 Units Subcutaneous QHS   insulin aspart  0-9 Units Subcutaneous TID WC   insulin aspart  3 Units Subcutaneous TID WC   insulin glargine-yfgn  10 Units Subcutaneous QHS   mouth rinse  15 mL Mouth Rinse BID   metoprolol tartrate  25 mg Oral BID   torsemide  20 mg Oral BID   Continuous Infusions:   LOS: 1 day    Time spent:26mins    Kathie Dike, MD [facility]   If 7PM-7AM, please contact night-coverage [email or web address]  08/14/2021, 7:12 PM

## 2021-08-14 NOTE — Progress Notes (Signed)
*  PRELIMINARY RESULTS* Echocardiogram 2D Echocardiogram has been performed.  Jack Mejia 08/14/2021, 10:31 AM

## 2021-08-15 ENCOUNTER — Encounter: Payer: Self-pay | Admitting: *Deleted

## 2021-08-15 DIAGNOSIS — N179 Acute kidney failure, unspecified: Secondary | ICD-10-CM | POA: Diagnosis not present

## 2021-08-15 DIAGNOSIS — D649 Anemia, unspecified: Secondary | ICD-10-CM | POA: Diagnosis not present

## 2021-08-15 DIAGNOSIS — D638 Anemia in other chronic diseases classified elsewhere: Secondary | ICD-10-CM | POA: Diagnosis not present

## 2021-08-15 DIAGNOSIS — I10 Essential (primary) hypertension: Secondary | ICD-10-CM | POA: Diagnosis not present

## 2021-08-15 LAB — TYPE AND SCREEN
ABO/RH(D): O POS
Antibody Screen: NEGATIVE
Unit division: 0
Unit division: 0
Unit division: 0
Unit division: 0

## 2021-08-15 LAB — PROTEIN ELECTROPHORESIS, SERUM
A/G Ratio: 1.7 (ref 0.7–1.7)
Albumin ELP: 3.7 g/dL (ref 2.9–4.4)
Alpha-1-Globulin: 0.2 g/dL (ref 0.0–0.4)
Alpha-2-Globulin: 0.7 g/dL (ref 0.4–1.0)
Beta Globulin: 0.7 g/dL (ref 0.7–1.3)
Gamma Globulin: 0.5 g/dL (ref 0.4–1.8)
Globulin, Total: 2.2 g/dL (ref 2.2–3.9)
Total Protein ELP: 5.9 g/dL — ABNORMAL LOW (ref 6.0–8.5)

## 2021-08-15 LAB — BPAM RBC
Blood Product Expiration Date: 202302162359
Blood Product Expiration Date: 202302202359
Blood Product Expiration Date: 202302202359
Blood Product Expiration Date: 202302222359
ISSUE DATE / TIME: 202301172243
ISSUE DATE / TIME: 202301180044
ISSUE DATE / TIME: 202301180515
ISSUE DATE / TIME: 202301191704
Unit Type and Rh: 5100
Unit Type and Rh: 5100
Unit Type and Rh: 5100
Unit Type and Rh: 5100

## 2021-08-15 LAB — GLUCOSE, CAPILLARY
Glucose-Capillary: 169 mg/dL — ABNORMAL HIGH (ref 70–99)
Glucose-Capillary: 223 mg/dL — ABNORMAL HIGH (ref 70–99)

## 2021-08-15 NOTE — Progress Notes (Signed)
Jack Mejia was contacted by telephone to verify understanding of discharge instructions status post their most recent discharge from the hospital on the date:  08/15/21.  Inpatient discharge AVS was re-reviewed with patient's wife, along with cancer center appointments.  Verification of understanding for oncology specific follow-up was validated using the Teach Back method.  Verified all appointments for Monday.  Transportation to appointments were confirmed for the patient as being self/caregiver.  Jack Mejia questions were addressed to their satisfaction upon completion of this post discharge follow-up call for outpatient oncology.

## 2021-08-15 NOTE — Progress Notes (Signed)
Discharge teaching complete. Meds, diet, activity, follow up appointments reviewed and all questions answered. Copy of instructions given to patient. Wife present during education, patient discharged home via wheelchair.

## 2021-08-15 NOTE — Progress Notes (Signed)
[place], [place] [address]   CLINIC:  Medical Oncology/Hematology  PCP:  Jack Spikes, DO [place] [address] [phone]   REASON FOR VISIT:  Follow-up for MDS  CURRENT THERAPY: PRBC transfusions as needed, intermittent IV iron infusions, oral iron supplementation, Retacrit  INTERVAL HISTORY:  Jack Mejia Jack Mejia 76 y.o. Jack Mejia returns for routine follow-up of his myelodysplastic syndrome.  He is seen today for follow-up after recent hospital discharge.  He was last seen in clinic by Jack Jack Mejia on 07/30/2021.  Jack Mejia Jack Mejia was admitted to hospital from 08/12/2021 through 08/15/2021 due to severe anemia.  He had Hgb 4.5 on admission, received 4 units PRBC transfusions during hospital stay.  Work-up during hospital stay showed reticulocytes 0.5, normal haptoglobin, negative DAT.  SPEP negative for M spike.  LDH 213.  Hemoccult stool was negative during hospital stay, therefore no EGD/colonoscopy performed.    Prior to hospital visit, he was feeling the symptoms of extreme fatigue, generalized weakness, and dyspnea on exertion.  At today's visit, he reports feeling better after receiving multiple blood transfusions during his hospital stay.  He has noted improvement in his chronic fatigue, with energy about 50% today.  He does continue to have some chronic shortness of breath and dyspnea on exertion, but this has also improved.  He denies any signs of gross hemorrhage such as epistaxis, hematemesis, hematochezia, or melena.  He does report that he had 1 dark bowel movement during the hospital stay, described as firm and hard in consistency.  He has 50% energy and 50% appetite. He endorses that he is maintaining a stable weight.   REVIEW OF SYSTEMS:  Review of Systems  Constitutional:  Positive for fatigue. Negative for appetite change, chills, diaphoresis, fever and unexpected weight change.  HENT:   Negative for lump/mass and  nosebleeds.   Eyes:  Negative for eye problems.  Respiratory:  Positive for shortness of breath (With exertion). Negative for cough and hemoptysis.   Cardiovascular:  Negative for chest pain, leg swelling and palpitations.  Gastrointestinal:  Negative for abdominal pain, blood in stool, constipation, diarrhea, nausea and vomiting.  Genitourinary:  Negative for hematuria.   Skin: Negative.   Neurological:  Negative for dizziness, headaches and light-headedness.  Hematological:  Does not bruise/bleed easily.  Psychiatric/Behavioral:  Positive for sleep disturbance.      PAST MEDICAL/SURGICAL HISTORY:  Past Medical History:  Diagnosis Date   Anxiety    Arthritis    Asthma    Atrial fibrillation ([facility])    a. s/p DCCV in 03/2019   CAD (coronary artery disease) 06/29/2019   Cramps of left lower extremity    Depression    Diabetes mellitus    type 2 for 7-8 yrs   Dyspnea    Elevated liver enzymes    Fatty liver    Gout    History of kidney stones    Hyperlipidemia    Hypertension    Renal insufficiency    Vertigo    Past Surgical History:  Procedure Laterality Date   BIOPSY  10/11/2020   Procedure: BIOPSY;  Surgeon: Harvel Quale, MD;  [facility];  Service: Gastroenterology;;   CARDIOVERSION N/A 04/21/2019   Procedure: CARDIOVERSION;  Surgeon: Sanda Klein, MD;  [facility];  Service: Cardiovascular;  Laterality: N/A;   CARPAL TUNNEL RELEASE     both wrist   cataract surgery     bilateral  CHOLECYSTECTOMY     COLONOSCOPY  2011   COLONOSCOPY WITH PROPOFOL N/A 10/11/2020   Procedure: COLONOSCOPY WITH PROPOFOL;  Surgeon: Harvel Quale, MD;  [facility];  Service: Gastroenterology;  Laterality: N/A;  AM   ESOPHAGOGASTRODUODENOSCOPY (EGD) WITH PROPOFOL N/A 10/11/2020   Procedure: ESOPHAGOGASTRODUODENOSCOPY (EGD) WITH PROPOFOL;  Surgeon: Harvel Quale, MD;  [facility];  Service: Gastroenterology;   Laterality: N/A;   EYE SURGERY     HEMORROIDECTOMY     KNEE SURGERY Left    LEFT HEART CATH AND CORONARY ANGIOGRAPHY N/A 05/23/2019   Procedure: LEFT HEART CATH AND CORONARY ANGIOGRAPHY;  Surgeon: Belva Crome, MD;  Location: [facility];  Service: Cardiovascular;  Laterality: N/A;   LUMBAR LAMINECTOMY/DECOMPRESSION MICRODISCECTOMY N/A 08/06/2016   Procedure: LUMBAR THREE- LUMBAR FIVE  DECOMPRESSIVE LUMBAR LAMINECTOMY;  Surgeon: Jovita Gamma, MD;  Location: [facility];  Service: Neurosurgery;  Laterality: N/A;   POLYPECTOMY  10/11/2020   Procedure: POLYPECTOMY;  Surgeon: Montez Morita, Quillian Quince, MD;  [facility];  Service: Gastroenterology;;     SOCIAL HISTORY:  Social History   Socioeconomic History   Marital status: Divorced    Spouse name: Not on file   Number of children: 2   Years of education: Not on file   Highest education level: Not on file  Occupational History   Occupation: RETIRED  Tobacco Use   Smoking status: Former    Packs/day: 4.00    Years: 20.00    Pack years: 80.00    Types: Cigarettes    Quit date: 10/14/1982    Years since quitting: 38.8   Smokeless tobacco: Never   Tobacco comments:    29 yrs ago  Vaping Use   Vaping Use: Never used  Substance and Sexual Activity   Alcohol use: No   Drug use: No   Sexual activity: Not Currently    Birth control/protection: None  Other Topics Concern   Not on file  Social History Narrative   Not on file   Social Determinants of Health   Financial Resource Strain: Medium Risk   Difficulty of Paying Living Expenses: Somewhat hard  Food Insecurity: No Food Insecurity   Worried About Charity fundraiser in the Last Year: Never true   Ran Out of Food in the Last Year: Never true  Transportation Needs: No Transportation Needs   Lack of Transportation (Medical): No   Lack of Transportation (Non-Medical): No  Physical Activity: Insufficiently Active   Days of Exercise per Week: 4 days    Minutes of Exercise per Session: 10 min  Stress: No Stress Concern Present   Feeling of Stress : Not at all  Social Connections: Moderately Isolated   Frequency of Communication with Friends and Family: More than three times a week   Frequency of Social Gatherings with Friends and Family: More than three times a week   Attends Religious Services: More than 4 times per year   Active Member of [employer] or Organizations: No   Attends Archivist Meetings: Never   Marital Status: Separated  Intimate Partner Violence: Not At Risk   Fear of Current or Ex-Partner: No   Emotionally Abused: No   Physically Abused: No   Sexually Abused: No    FAMILY HISTORY:  Family History  Problem Relation Age of Onset   Diabetes Mother    Cancer Mother        unknown kind   Diabetes Sister  COPD Sister    Liver disease Brother    COPD Brother    Diabetes Maternal Grandmother    Diabetes Brother    COPD Sister    Healthy Daughter    Healthy Daughter    Colon cancer Neg Hx     CURRENT MEDICATIONS:  Outpatient Encounter Medications as of 08/18/2021  Medication Sig   acetaminophen (TYLENOL) 650 MG CR tablet Take 650 mg by mouth every 8 (eight) hours as needed for pain.   allopurinol (ZYLOPRIM) 300 MG tablet TAKE 1 TABLET (300 MG TOTAL) BY MOUTH DAILY. START THIS AFTER GOUT FLARE.   atorvastatin (LIPITOR) 20 MG tablet TAKE 1 TABLET BY MOUTH EVERY DAY   augmented betamethasone dipropionate (DIPROLENE-AF) 0.05 % cream Apply topically.   BD PEN NEEDLE NANO 2ND GEN 32G X 4 MM MISC Use with insulin pen qid.   diltiazem (CARDIZEM CD) 360 MG 24 hr capsule TAKE 1 CAPSULE BY MOUTH EVERY DAY   empagliflozin (JARDIANCE) 10 MG TABS tablet Take 1 tablet (10 mg total) by mouth daily before breakfast.   ferrous sulfate 325 (65 FE) MG tablet Take 325 mg by mouth. Takes one every other day   LEVEMIR FLEXTOUCH 100 UNIT/ML FlexTouch Pen Inject 24 Units into the skin at bedtime.   metoprolol tartrate  (LOPRESSOR) 25 MG tablet Take 1 tablet (25 mg total) by mouth 2 (two) times daily.   NOVOLOG FLEXPEN 100 UNIT/ML FlexPen INJECT 15 UNITS INTO THE SKIN 3 (THREE) TIMES DAILY WITH MEALS   ONETOUCH VERIO test strip TEST TWICE DAILY AS DIRECTED   potassium chloride (KLOR-CON) 10 MEQ tablet TAKE 1 TABLET BY MOUTH EVERY DAY   torsemide (DEMADEX) 20 MG tablet TAKE 1 TABLET BY MOUTH TWICE A DAY   traMADol (ULTRAM) 50 MG tablet Take 1-2 tab p.o. q8hrs prn pain (Patient taking differently: 50-100 mg every 6 (six) hours as needed for moderate pain.)   Facility-Administered Encounter Medications as of 08/18/2021  Medication   0.9 %  sodium chloride infusion (Manually program via Guardrails IV Fluids)   atorvastatin (LIPITOR) tablet 20 mg   Chlorhexidine Gluconate Cloth 2 % PADS 6 each   ferrous sulfate tablet 325 mg   insulin aspart (novoLOG) injection 0-5 Units   insulin aspart (novoLOG) injection 0-9 Units   insulin aspart (novoLOG) injection 3 Units   insulin glargine-yfgn (SEMGLEE) injection 10 Units   MEDLINE mouth rinse   metoprolol tartrate (LOPRESSOR) tablet 25 mg   ondansetron (ZOFRAN) injection 4 mg   torsemide (DEMADEX) tablet 20 mg    ALLERGIES:  Allergies  Allergen Reactions   Demerol Nausea And Vomiting   Meperidine Hcl Nausea And Vomiting   Vasotec [Enalapril] Cough   Lasix [Furosemide] Rash     PHYSICAL EXAM:  ECOG PERFORMANCE STATUS: 2 - Symptomatic, <50% confined to bed  There were no vitals filed for this visit. There were no vitals filed for this visit. Physical Exam Vitals reviewed.  Constitutional:      Appearance: Normal appearance.     Comments: Somewhat tired-appearing  Cardiovascular:     Rate and Rhythm: Normal rate and regular rhythm.     Pulses: Normal pulses.     Heart sounds: Normal heart sounds.  Pulmonary:     Effort: Pulmonary effort is normal.     Breath sounds: Normal breath sounds.  Neurological:     General: No focal deficit present.      Mental Status: He is alert and oriented to person, place, and time.  Psychiatric:        Mood and Affect: Mood normal.        Behavior: Behavior normal.     LABORATORY DATA:  I have reviewed the labs as listed.  CBC    Component Value Date/Time   WBC 3.2 (L) 08/14/2021 0437   RBC 2.22 (L) 08/14/2021 0437   HGB 8.7 (L) 08/14/2021 2140   HGB 11.9 (L) 09/07/2018 1638   HCT 25.0 (L) 08/14/2021 2140   HCT 34.2 (L) 09/07/2018 1638   PLT 202 08/14/2021 0437   PLT 229 09/07/2018 1638   MCV 99.5 08/14/2021 0437   MCV 101 (H) 09/07/2018 1638   MCH 33.8 08/14/2021 0437   MCHC 33.9 08/14/2021 0437   RDW 22.5 (H) 08/14/2021 0437   RDW 12.8 09/07/2018 1638   LYMPHSABS 0.9 07/30/2021 0806   LYMPHSABS 1.3 09/07/2018 1638   MONOABS 1.2 (H) 07/30/2021 0806   EOSABS 0.0 07/30/2021 0806   EOSABS 0.2 09/07/2018 1638   BASOSABS 0.0 07/30/2021 0806   BASOSABS 0.1 09/07/2018 1638   CMP Latest Ref Rng & Units 08/14/2021 08/13/2021 08/12/2021  Glucose 70 - 99 mg/dL 175(H) 231(H) 244(H)  BUN 8 - 23 mg/dL 50(H) 51(H) 51(H)  Creatinine 0.61 - 1.24 mg/dL 1.92(H) 2.22(H) 2.37(H)  Sodium 135 - 145 mmol/L 137 134(L) 134(L)  Potassium 3.5 - 5.1 mmol/L 3.4(L) 3.5 3.9  Chloride 98 - 111 mmol/L 102 100 100  CO2 22 - 32 mmol/L 23 21(L) 19(L)  Calcium 8.9 - 10.3 mg/dL 8.5(L) 8.3(L) 9.0  Total Protein 6.5 - 8.1 g/dL 6.1(L) - 7.0  Total Bilirubin 0.3 - 1.2 mg/dL 2.0(H) - 1.6(H)  Alkaline Phos 38 - 126 U/L 90 - 120  AST 15 - 41 U/L 25 - 20  ALT 0 - 44 U/L 27 - 16    DIAGNOSTIC IMAGING:  I have independently reviewed the relevant imaging and discussed with the patient.  ASSESSMENT & PLAN: 1.  Low risk MDS - Bone marrow biopsy on 03/29/2020 shows hypercellular marrow with trilineage dysplasia; 2% blasts; no ring sideroblasts noted.  Chromosome analysis and FISH panel were normal. - Serum erythropoietin was 77.2 (12/14/2019).  Most recent erythropoietin (07/30/2021) is 1,118 - Retacrit 30,000 units monthly  started on 04/08/2020 - increased to 40,000 units Q 2 weeks on 07/30/2020 - Last Retacrit was on 07/30/2020 at 40,000 units.   - Downtrending hemoglobin since November 2022.  No improvement with IV iron.  Iron panel negative for iron deficiency.  Normal folate and B12 when checked on 07/30/2020. - Hemoccult stool x3 was negative (08/04/2021) - Admitted to hospital from 08/12/2021 through 08/15/2021 due to severe anemia.  Hgb 4.5 on admission, received 4 units PRBC transfusions.  Work-up during hospital stay showed reticulocytes 0.5, normal haptoglobin, negative DAT.  SPEP negative for M spike.  LDH 213.  Hemoccult stool was negative during hospital stay, therefore no EGD/colonoscopy performed.   - He denies any gross rectal hemorrhage or melena - Symptoms improved after recent blood transfusions, but he continues to have chronic fatigue and dyspnea on exertion. - CBC today (08/18/2021): Hgb 9.0, platelets 281, WBC 1.9/ANC 0.4 - PLAN: Patient likely has progression of his MDS and development of EPO resistance - Increase Retacrit to 60,000 units weekly, first dose to be given today - If no improvement on high-dose Retacrit, will need repeat bone marrow biopsy prior to initiating other treatment options such as Vidaza or Luspatercept if applicable - Weekly CBC with sample to blood bank - Repeat  labs and RTC in 4 weeks - Neutropenic precautions due to [place] < 0.5  2.  Iron deficiency - Received Venofer x 5 doses, completed on 01/24/2020 - Received Venofer 400 mg x 1 dose on 05/07/2021 due to downtrending hemoglobin and concern for functional iron deficiency.  (At that time, labs showed ferritin 323, iron saturation 23%, Hgb 10.3) - No significant improvement in hemoglobin after IV Venofer - EGD (10/11/2020): Normal esophagus, stomach, duodenum - Colonoscopy (10/11/2020): Polyp x4, otherwise normal colon.  Per note by Jack Mejia Jack Mejia (02/13/2021), rectal bleeding was thought to be related to inflammatory polyp in  rectum. - He is on Eliquis for atrial fibrillation and has occasional bleeding on tissue with bowel movements.   - He is taking iron tablet every other day. - He denies any recent rectal bleeding or melena.   - Labs (07/30/2021) showed elevated ferritin 632, serum iron 220, and iron saturation 81% - PLAN: No indication for IV iron.  Continue oral iron supplementation every other day.   - We will recheck iron panel in 3 months.   3.  CKD stage IIIb: - History of CKD since 2017. - Renal ultrasound on 04/21/2019 shows bilateral renal cysts. - Baseline creatinine around 1.7-1.9 - PLAN: Continue follow-up with PCP and nephrologist.     4.  Atrial fibrillation, on Eliquis: - Eliquis was started back sometime in August 2022 - held again during most recent hospitalization (January 2022) due to severe anemia. - He reported occasional scant nosebleeds.  Denies any frank bleeding per rectum or melena, but does have occasional dark bowel movements. - He follows with cardiology - PLAN: Agree with holding Eliquis until further evaluation by cardiology.  5.  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  Amiodarone was discontinued. - Was cleared by pulmonology and is being followed by PCP - Per PCP note (07/11/2021), DOE is multifactorial related to baseline anemia, A. fib, and his prior lung injury - PLAN: Continue follow-up with PCP   PLAN SUMMARY & DISPOSITION: Weekly CBC with sample to blood bank Weekly Retacrit + possible transfusion Office visit in 4 weeks  All questions were answered. The patient knows to call the clinic with any problems, questions or concerns.  Medical decision making: Moderate  Time spent on visit: I spent 20 minutes counseling the patient face to face. The total time spent in the appointment was 30 minutes and more than 50% was on counseling.  I, Jack Jack Mejia, have seen this patient in conjunction with Jack Mejia Jack Mejia. Greater than 50% of visit was performed by Jack Mejia Jack Mejia.   Jack Mejia Rush, Jack Mejia Jack Mejia  08/18/2021  Jack Mejia Jack Mejia: I have independently evaluated this patient and formulated my assessment and plan.  Recent admission to the hospital with acute severe anemia with hemoglobin 4.5 with no bleeding source.  Received blood transfusion.  Hemoglobin today is 9.0.  Question if he is losing response to erythropoiesis stimulating agents.  We will increase Procrit to 60,000 units weekly.  Reevaluate him in 4 weeks.  If there is no improvement, consider bone marrow aspiration and biopsy.

## 2021-08-15 NOTE — Progress Notes (Signed)
Oncology Discharge Planning Note  [place] at [facility] Address: [address], [place] [address] Hours of Operation:  8am - 5pm, Monday - Friday  Clinic Contact Information:  [phone]  Oncology Care Team: Medical Oncologist:  Derek Jack  Patient Details: Name:  Jack Mejia, Jack Mejia MRN:   [id number] DOB:   1946-01-31 Reason for Current Admission: Symptomatic anemia  Discharge Planning Narrative: Discharge follow-up appointments for oncology are current and available on the AVS and MyChart.  Called ICU to make them aware to go over this information prior to discharge. Upon discharge from the hospital, hematology/oncology's post discharge plan of care for the outpatient setting is: Monday 08/18/21   Jack Mejia will be called within two business days after discharge to review hematology/oncology's plan of care for full understanding.    Outpatient Oncology Specific Care Only: Oncology appointment transportation needs addressed?:  not applicable Oncology medication management for symptom management addressed?:  not applicable Chemo Alert Card reviewed?:  not applicable Immunotherapy Alert Card reviewed?:  not applicable

## 2021-08-15 NOTE — Discharge Summary (Signed)
Physician Discharge Summary  Jack Mejia:[id number] DOB: [birth year] DOA: 08/12/2021  PCP: Coral Spikes, DO  Admit date: 08/12/2021 Discharge date: 08/15/2021  Admitted From: Home Disposition: Home  Recommendations for Outpatient Follow-up:  Follow up with PCP in 1-2 weeks Please obtain BMP/CBC in one week He has a follow-up appointment with oncology on 1/23 Follow-up with cardiology to be scheduled to discuss anticoagulation  Home Health: Equipment/Devices:  Discharge Condition: Stable CODE STATUS: DNR Diet recommendation: Heart healthy, carb modified  Brief/Interim Summary: 76 year old male with a history of macrocytic anemia, myelodysplastic syndrome, hypertension, diastolic heart failure, chronic kidney disease stage IV, was admitted to the hospital with shortness of breath.  He was found to have a hemoglobin of 4.5.  Did not have any evidence of GI bleeding.  He was transfused a total of 4 units of PRBCs.  During his second unit of blood, he did develop respiratory distress and flash pulmonary edema.  He briefly required BiPAP, but improved with diuresis.  Overall hemoglobin has been stable.  Discharge Diagnoses:  Principal Problem:   Symptomatic anemia Active Problems:   Essential hypertension   Anemia of chronic disease   Acute kidney injury superimposed on CKD (HCC)   Type 2 diabetes mellitus (HCC)   Flash pulmonary edema (HCC)   Iron deficiency anemia   Hyperglycemia due to diabetes mellitus (HCC)  Symptomatic anemia -Admission hemoglobin noted to be 4.5, this was 7.6 approximately 2 weeks ago -He has not been taking any NSAIDs, denies any recent blood loss -Stool for FOBT noted to be negative -He is followed by oncology for macrocytic anemia with concern for underlying MDS -Discussed with Dr. Delton Coombes who requested that SPEP, haptoglobin, LDH and Coombs test be checked -These results to be followed up on next oncology appointment -He has been transfused 3  units PRBC -Follow-up hemoglobin improved to 7.4 -Discussed with oncology who recommended keeping hemoglobin greater than 8.   -He was transfused 1/4 unit of PRBC on 1/19 with improvement of hemoglobin to 8.7   flash pulmonary edema -This occurred after receiving PRBC transfusions -He received intravenous Lasix -Overall respiratory status appears to be improving -Echocardiogram shows preserved ejection fraction   Acute respiratory failure with hypoxia -Required BiPAP for flash pulmonary edema -Overall oxygen has been weaned down and today noted to be on room air -Continue to follow   Chronic kidney disease stage IV -Creatinine appears to be close to baseline -We will continue to monitor   Acute on chronic diastolic congestive heart failure -Secondary to volume overload in the setting of PRBC transfusions -Restart oral torsemide -Overall volume status appears to have improved and oxygen is being weaned off.   Atrial fibrillation, paroxysmal -Chronically on Eliquis, on hold for now -Suspect to be reasonable to continue to hold Eliquis until he can follow-up with his cardiologist -CHA2DS2-VASc score 6 -Discussed with Dr. Harl Bowie, cardiology who also agreed on holding Eliquis for now until he can follow-up with his primary cardiologist.   Hypertension -Continue Lopressor -Resume diltiazem on discharge   Hyperlipidemia -Continue statin   Type 2 diabetes -Continue on basal insulin and sliding scale  Discharge Instructions  Discharge Instructions     Diet - low sodium heart healthy   Complete by: As directed    Increase activity slowly   Complete by: As directed       Allergies as of 08/15/2021       Reactions   Demerol Nausea And Vomiting   Meperidine Hcl Nausea And Vomiting  Vasotec [enalapril] Cough   Lasix [furosemide] Rash        Medication List     STOP taking these medications    apixaban 5 MG Tabs tablet Commonly known as: ELIQUIS    clotrimazole-betamethasone cream Commonly known as: Lotrisone   colchicine 0.6 MG tablet   sertraline 100 MG tablet Commonly known as: ZOLOFT   Venelex Oint       TAKE these medications    acetaminophen 650 MG CR tablet Commonly known as: TYLENOL Take 650 mg by mouth every 8 (eight) hours as needed for pain.   allopurinol 300 MG tablet Commonly known as: ZYLOPRIM TAKE 1 TABLET (300 MG TOTAL) BY MOUTH DAILY. START THIS AFTER GOUT FLARE.   atorvastatin 20 MG tablet Commonly known as: LIPITOR TAKE 1 TABLET BY MOUTH EVERY DAY   augmented betamethasone dipropionate 0.05 % cream Commonly known as: DIPROLENE-AF Apply topically.   BD Pen Needle Nano 2nd Gen 32G X 4 MM Misc Generic drug: Insulin Pen Needle Use with insulin pen qid.   diltiazem 360 MG 24 hr capsule Commonly known as: CARDIZEM CD TAKE 1 CAPSULE BY MOUTH EVERY DAY   empagliflozin 10 MG Tabs tablet Commonly known as: Jardiance Take 1 tablet (10 mg total) by mouth daily before breakfast.   ferrous sulfate 325 (65 FE) MG tablet Take 325 mg by mouth. Takes one every other day   Levemir FlexTouch 100 UNIT/ML FlexPen Generic drug: insulin detemir Inject 24 Units into the skin at bedtime.   metoprolol tartrate 25 MG tablet Commonly known as: LOPRESSOR Take 1 tablet (25 mg total) by mouth 2 (two) times daily.   NovoLOG FlexPen 100 UNIT/ML FlexPen Generic drug: insulin aspart INJECT 15 UNITS INTO THE SKIN 3 (THREE) TIMES DAILY WITH MEALS   OneTouch Verio test strip Generic drug: glucose blood TEST TWICE DAILY AS DIRECTED   potassium chloride 10 MEQ tablet Commonly known as: KLOR-CON TAKE 1 TABLET BY MOUTH EVERY DAY   torsemide 20 MG tablet Commonly known as: DEMADEX TAKE 1 TABLET BY MOUTH TWICE A DAY   traMADol 50 MG tablet Commonly known as: ULTRAM Take 1-2 tab p.o. q8hrs prn pain What changed:  how much to take when to take this reasons to take this additional instructions         Follow-up Information     Derek Jack, MD Follow up on 08/18/2021.   Specialty: Hematology Contact information: [place] [address] [phone]         cardiology office will contact you with an appointment Follow up.                 Allergies  Allergen Reactions   Demerol Nausea And Vomiting   Meperidine Hcl Nausea And Vomiting   Vasotec [Enalapril] Cough   Lasix [Furosemide] Rash    Consultations:    Procedures/Studies: DG Chest Port 1 View  Result Date: 08/13/2021 CLINICAL DATA:  Hypoxia and weakness, initial encounter EXAM: PORTABLE CHEST 1 VIEW COMPARISON:  08/12/2021 FINDINGS: Cardiac shadow remains enlarged. Increasing vascular congestion is noted with evidence of pulmonary edema and small effusions bilaterally. No focal confluent infiltrate is seen. No bony abnormality is noted. IMPRESSION: Increasing changes of CHF with effusions and pulmonary edema. Electronically Signed   By: Inez Catalina M.D.   On: 08/13/2021 03:34   DG Chest Portable 1 View  Result Date: 08/12/2021 CLINICAL DATA:  Shortness of breath. EXAM: PORTABLE CHEST 1 VIEW COMPARISON:  Chest x-ray 03/04/2021. FINDINGS:  The heart is enlarged. There central pulmonary vascular congestion. There are central interstitial opacities throughout both lungs. There are patchy airspace opacities in the left lung base. There are small bilateral pleural effusions. There is no pneumothorax or acute fracture. IMPRESSION: 1. Cardiomegaly with mild pulmonary edema and small pleural effusions. Electronically Signed   By: Ronney Asters M.D.   On: 08/12/2021 23:47   ECHOCARDIOGRAM COMPLETE  Result Date: 08/14/2021    ECHOCARDIOGRAM REPORT   Patient Name:   NIALL ILLES Date of Exam: 08/14/2021 Medical Rec #:  [id number]       Height:       69.0 in Accession #:    [id number]      Weight:       186.7 lb Date of Birth:  1946/03/17      BSA:          2.007 m Patient Age:    60 years        BP:            126/67 mmHg Patient Gender: M               HR:           80 bpm. Exam Location:  [facility] Procedure: 2D Echo, Cardiac Doppler and Color Doppler Indications:    CHF  History:        Patient has prior history of Echocardiogram examinations, most                 recent 09/26/2020. CHF, CAD, Arrythmias:Atrial Fibrillation,                 Signs/Symptoms:Shortness of Breath; Risk Factors:Hypertension,                 Diabetes, Dyslipidemia and Former Smoker.  Sonographer:    Wenda Low Referring Phys: [place]  1. Left ventricular ejection fraction, by estimation, is 55 to 60%. The left ventricle has normal function. The left ventricle has no regional wall motion abnormalities. There is moderate left ventricular hypertrophy. Left ventricular diastolic parameters are indeterminate. Elevated left atrial pressure.  2. Right ventricular systolic function is normal. The right ventricular size is normal. There is mildly elevated pulmonary artery systolic pressure.  3. Left atrial size was mildly dilated.  4. The mitral valve is normal in structure. Mild mitral valve regurgitation. No evidence of mitral stenosis.  5. The aortic valve is tricuspid. There is moderate calcification of the aortic valve. There is moderate thickening of the aortic valve. Aortic valve regurgitation is not visualized. Moderate aortic valve stenosis.  6. The inferior vena cava is normal in size with greater than 50% respiratory variability, suggesting right atrial pressure of 3 mmHg. FINDINGS  Left Ventricle: Left ventricular ejection fraction, by estimation, is 55 to 60%. The left ventricle has normal function. The left ventricle has no regional wall motion abnormalities. The left ventricular internal cavity size was normal in size. There is  moderate left ventricular hypertrophy. Left ventricular diastolic parameters are indeterminate. Elevated left atrial pressure. Right Ventricle: The right ventricular size is normal.  No increase in right ventricular wall thickness. Right ventricular systolic function is normal. There is mildly elevated pulmonary artery systolic pressure. The tricuspid regurgitant velocity is 3.07  m/s, and with an assumed right atrial pressure of 3 mmHg, the estimated right ventricular systolic pressure is 65.0 mmHg. Left Atrium: Left atrial size was mildly dilated. Right Atrium: Right atrial size was normal in size.  Pericardium: There is no evidence of pericardial effusion. Mitral Valve: The mitral valve is normal in structure. Mild mitral valve regurgitation. No evidence of mitral valve stenosis. MV peak gradient, 6.2 mmHg. The mean mitral valve gradient is 3.0 mmHg. Tricuspid Valve: The tricuspid valve is normal in structure. Tricuspid valve regurgitation is mild . No evidence of tricuspid stenosis. Aortic Valve: The aortic valve is tricuspid. There is moderate calcification of the aortic valve. There is moderate thickening of the aortic valve. There is moderate aortic valve annular calcification. Aortic valve regurgitation is not visualized. Moderate aortic stenosis is present. Aortic valve mean gradient measures 24.0 mmHg. Aortic valve peak gradient measures 44.0 mmHg. Aortic valve area, by VTI measures 1.07 cm. Pulmonic Valve: The pulmonic valve was not well visualized. Pulmonic valve regurgitation is not visualized. No evidence of pulmonic stenosis. Aorta: The aortic root is normal in size and structure. Venous: The inferior vena cava is normal in size with greater than 50% respiratory variability, suggesting right atrial pressure of 3 mmHg. IAS/Shunts: No atrial level shunt detected by color flow Doppler.  LEFT VENTRICLE PLAX 2D LVIDd:         5.00 cm     Diastology LVIDs:         3.10 cm     LV e' medial:    5.66 cm/s LV PW:         1.30 cm     LV E/e' medial:  20.3 LV IVS:        1.30 cm     LV e' lateral:   7.83 cm/s LVOT diam:     2.00 cm     LV E/e' lateral: 14.7 LV SV:         81 LV SV Index:   40  LVOT Area:     3.14 cm  LV Volumes (MOD) LV vol d, MOD A2C: 58.1 ml LV vol d, MOD A4C: 43.1 ml LV vol s, MOD A2C: 25.7 ml LV vol s, MOD A4C: 19.0 ml LV SV MOD A2C:     32.4 ml LV SV MOD A4C:     43.1 ml LV SV MOD BP:      28.9 ml RIGHT VENTRICLE RV Basal diam:  3.45 cm RV Mid diam:    3.30 cm RV S prime:     13.20 cm/s TAPSE (M-mode): 2.5 cm LEFT ATRIUM             Index        RIGHT ATRIUM           Index LA diam:        4.10 cm 2.04 cm/m   RA Area:     18.70 cm LA Vol (A2C):   50.6 ml 25.22 ml/m  RA Volume:   50.90 ml  25.37 ml/m LA Vol (A4C):   74.2 ml 36.98 ml/m LA Biplane Vol: 64.8 ml 32.29 ml/m  AORTIC VALVE                     PULMONIC VALVE AV Area (Vmax):    1.01 cm      PV Vmax:       0.89 m/s AV Area (Vmean):   0.96 cm      PV Peak grad:  3.1 mmHg AV Area (VTI):     1.07 cm AV Vmax:           331.50 cm/s AV Vmean:          225.500  cm/s AV VTI:            0.756 m AV Peak Grad:      44.0 mmHg AV Mean Grad:      24.0 mmHg LVOT Vmax:         106.95 cm/s LVOT Vmean:        69.150 cm/s LVOT VTI:          0.258 m LVOT/AV VTI ratio: 0.34  AORTA Ao Root diam: 3.20 cm Ao Asc diam:  2.80 cm MITRAL VALVE                TRICUSPID VALVE MV Area (PHT): 3.42 cm     TR Peak grad:   37.7 mmHg MV Area VTI:   2.33 cm     TR Vmax:        307.00 cm/s MV Peak grad:  6.2 mmHg MV Mean grad:  3.0 mmHg     SHUNTS MV Vmax:       1.25 m/s     Systemic VTI:  0.26 m MV Vmean:      84.3 cm/s    Systemic Diam: 2.00 cm MV Decel Time: 222 msec MV E velocity: 115.00 cm/s MV A velocity: 116.00 cm/s MV E/A ratio:  0.99 Carlyle Dolly MD Electronically signed by Carlyle Dolly MD Signature Date/Time: 08/14/2021/1:12:54 PM    Final       Subjective: Denies any shortness of breath, feels well today.  Discharge Exam: Vitals:   08/15/21 0300 08/15/21 0500 08/15/21 0700 08/15/21 1111  BP: (!) 126/54     Pulse: 94     Resp: 13     Temp:   98.4 F (36.9 C) 98.2 F (36.8 C)  TempSrc:   Oral Oral  SpO2: 98%     Weight:   83.5 kg    Height:        General: Pt is alert, awake, not in acute distress Cardiovascular: RRR, S1/S2 +, no rubs, no gallops Respiratory: CTA bilaterally, no wheezing, no rhonchi Abdominal: Soft, NT, ND, bowel sounds + Extremities: no edema, no cyanosis    The results of significant diagnostics from this hospitalization (including imaging, microbiology, ancillary and laboratory) are listed below for reference.     Microbiology: Recent Results (from the past 240 hour(s))  Resp Panel by RT-PCR (Flu A&B, Covid) Nasopharyngeal Swab     Status: None   Collection Time: 08/13/21  1:23 AM   Specimen: Nasopharyngeal Swab; Nasopharyngeal(NP) swabs in vial transport medium  Result Value Ref Range Status   SARS Coronavirus 2 by RT PCR NEGATIVE NEGATIVE Final    Comment: (NOTE) SARS-CoV-2 target nucleic acids are NOT DETECTED.  The SARS-CoV-2 RNA is generally detectable in upper respiratory specimens during the acute phase of infection. The lowest concentration of SARS-CoV-2 viral copies this assay can detect is 138 copies/mL. A negative result does not preclude SARS-Cov-2 infection and should not be used as the sole basis for treatment or other patient management decisions. A negative result may occur with  improper specimen collection/handling, submission of specimen other than nasopharyngeal swab, presence of viral mutation(s) within the areas targeted by this assay, and inadequate number of viral copies(<138 copies/mL). A negative result must be combined with clinical observations, patient history, and epidemiological information. The expected result is Negative.  Fact Sheet for Patients:  EntrepreneurPulse.com.au  Fact Sheet for Healthcare Providers:  IncredibleEmployment.be  This test is no t yet approved or cleared by the [place] FDA and  has  been authorized for detection and/or diagnosis of SARS-CoV-2 by FDA under an Emergency Use  Authorization (EUA). This EUA will remain  in effect (meaning this test can be used) for the duration of the COVID-19 declaration under Section 564(b)(1) of the Act, 21 U.S.C.section 360bbb-3(b)(1), unless the authorization is terminated  or revoked sooner.       Influenza A by PCR NEGATIVE NEGATIVE Final   Influenza B by PCR NEGATIVE NEGATIVE Final    Comment: (NOTE) The Xpert Xpress SARS-CoV-2/FLU/RSV plus assay is intended as an aid in the diagnosis of influenza from Nasopharyngeal swab specimens and should not be used as a sole basis for treatment. Nasal washings and aspirates are unacceptable for Xpert Xpress SARS-CoV-2/FLU/RSV testing.  Fact Sheet for Patients: EntrepreneurPulse.com.au  Fact Sheet for Healthcare Providers: IncredibleEmployment.be  This test is not yet approved or cleared by the [place] FDA and has been authorized for detection and/or diagnosis of SARS-CoV-2 by FDA under an Emergency Use Authorization (EUA). This EUA will remain in effect (meaning this test can be used) for the duration of the COVID-19 declaration under Section 564(b)(1) of the Act, 21 U.S.C. section 360bbb-3(b)(1), unless the authorization is terminated or revoked.  Performed at [facility], [address] [place], [place] [address]   MRSA Next Gen by PCR, Nasal     Status: None   Collection Time: 08/13/21  3:57 AM   Specimen: Nasal Mucosa; Nasal Swab  Result Value Ref Range Status   MRSA by PCR Next Gen NOT DETECTED NOT DETECTED Final    Comment: (NOTE) The GeneXpert MRSA Assay (FDA approved for NASAL specimens only), is one component of a comprehensive MRSA colonization surveillance program. It is not intended to diagnose MRSA infection nor to guide or monitor treatment for MRSA infections. Test performance is not FDA approved in patients less than 31 years old. Performed at [facility], [address], [place],  [address]       Labs: BNP (last 3 results) No results for input(s): BNP in the last 8760 hours. Basic Metabolic Panel: Recent Labs  Lab 08/12/21 2122 08/13/21 1341 08/14/21 0437  NA 134* 134* 137  K 3.9 3.5 3.4*  CL 100 100 102  CO2 19* 21* 23  GLUCOSE 244* 231* 175*  BUN 51* 51* 50*  CREATININE 2.37* 2.22* 1.92*  CALCIUM 9.0 8.3* 8.5*  MG  --   --  2.6*  PHOS  --   --  3.7   Liver Function Tests: Recent Labs  Lab 08/12/21 2122 08/14/21 0437  AST 20 25  ALT 16 27  ALKPHOS 120 90  BILITOT 1.6* 2.0*  PROT 7.0 6.1*  ALBUMIN 4.2 3.6   Recent Labs  Lab 08/12/21 2122  LIPASE 35   No results for input(s): AMMONIA in the last 168 hours. CBC: Recent Labs  Lab 08/12/21 2122 08/13/21 1341 08/14/21 0437 08/14/21 2140  WBC 3.6* 2.9* 3.2*  --   HGB 4.5* 7.8* 7.5* 8.7*  HCT 13.1* 22.2* 22.1* 25.0*  MCV 122.4* 100.5* 99.5  --   PLT 230 205 202  --    Cardiac Enzymes: No results for input(s): CKTOTAL, CKMB, CKMBINDEX, TROPONINI in the last 168 hours. BNP: Invalid input(s): POCBNP CBG: Recent Labs  Lab 08/14/21 1124 08/14/21 1605 08/14/21 2154 08/15/21 0725 08/15/21 1112  GLUCAP 184* 233* 143* 169* 223*   D-Dimer No results for input(s): DDIMER in the last 72 hours. Hgb A1c Recent Labs    08/12/21 2122  HGBA1C 6.9*  Lipid Profile No results for input(s): CHOL, HDL, LDLCALC, TRIG, CHOLHDL, LDLDIRECT in the last 72 hours. Thyroid function studies No results for input(s): TSH, T4TOTAL, T3FREE, THYROIDAB in the last 72 hours.  Invalid input(s): FREET3 Anemia work up Recent Labs    08/12/21 2122  RETICCTPCT 0.5   Urinalysis    Component Value Date/Time   COLORURINE YELLOW 08/12/2021 2059   APPEARANCEUR CLEAR 08/12/2021 2059   APPEARANCEUR Clear 02/19/2021 1316   LABSPEC 1.015 08/12/2021 2059   PHURINE 5.5 08/12/2021 2059   GLUCOSEU >=500 (A) 08/12/2021 2059   HGBUR NEGATIVE 08/12/2021 2059   BILIRUBINUR NEGATIVE 08/12/2021 2059   BILIRUBINUR Negative  02/19/2021 [place] 08/12/2021 2059   PROTEINUR NEGATIVE 08/12/2021 2059   NITRITE NEGATIVE 08/12/2021 2059   LEUKOCYTESUR NEGATIVE 08/12/2021 2059   Sepsis Labs Invalid input(s): PROCALCITONIN,  WBC,  LACTICIDVEN Microbiology Recent Results (from the past 240 hour(s))  Resp Panel by RT-PCR (Flu A&B, Covid) Nasopharyngeal Swab     Status: None   Collection Time: 08/13/21  1:23 AM   Specimen: Nasopharyngeal Swab; Nasopharyngeal(NP) swabs in vial transport medium  Result Value Ref Range Status   SARS Coronavirus 2 by RT PCR NEGATIVE NEGATIVE Final    Comment: (NOTE) SARS-CoV-2 target nucleic acids are NOT DETECTED.  The SARS-CoV-2 RNA is generally detectable in upper respiratory specimens during the acute phase of infection. The lowest concentration of SARS-CoV-2 viral copies this assay can detect is 138 copies/mL. A negative result does not preclude SARS-Cov-2 infection and should not be used as the sole basis for treatment or other patient management decisions. A negative result may occur with  improper specimen collection/handling, submission of specimen other than nasopharyngeal swab, presence of viral mutation(s) within the areas targeted by this assay, and inadequate number of viral copies(<138 copies/mL). A negative result must be combined with clinical observations, patient history, and epidemiological information. The expected result is Negative.  Fact Sheet for Patients:  EntrepreneurPulse.com.au  Fact Sheet for Healthcare Providers:  IncredibleEmployment.be  This test is no t yet approved or cleared by the [place] FDA and  has been authorized for detection and/or diagnosis of SARS-CoV-2 by FDA under an Emergency Use Authorization (EUA). This EUA will remain  in effect (meaning this test can be used) for the duration of the COVID-19 declaration under Section 564(b)(1) of the Act, 21 U.S.C.section  360bbb-3(b)(1), unless the authorization is terminated  or revoked sooner.       Influenza A by PCR NEGATIVE NEGATIVE Final   Influenza B by PCR NEGATIVE NEGATIVE Final    Comment: (NOTE) The Xpert Xpress SARS-CoV-2/FLU/RSV plus assay is intended as an aid in the diagnosis of influenza from Nasopharyngeal swab specimens and should not be used as a sole basis for treatment. Nasal washings and aspirates are unacceptable for Xpert Xpress SARS-CoV-2/FLU/RSV testing.  Fact Sheet for Patients: EntrepreneurPulse.com.au  Fact Sheet for Healthcare Providers: IncredibleEmployment.be  This test is not yet approved or cleared by the [place] FDA and has been authorized for detection and/or diagnosis of SARS-CoV-2 by FDA under an Emergency Use Authorization (EUA). This EUA will remain in effect (meaning this test can be used) for the duration of the COVID-19 declaration under Section 564(b)(1) of the Act, 21 U.S.C. section 360bbb-3(b)(1), unless the authorization is terminated or revoked.  Performed at [facility], [address] [place], [place] [address]   MRSA Next Gen by PCR, Nasal     Status: None   Collection Time: 08/13/21  3:57 AM   Specimen: Nasal Mucosa; Nasal Swab  Result Value Ref Range Status   MRSA by PCR Next Gen NOT DETECTED NOT DETECTED Final    Comment: (NOTE) The GeneXpert MRSA Assay (FDA approved for NASAL specimens only), is one component of a comprehensive MRSA colonization surveillance program. It is not intended to diagnose MRSA infection nor to guide or monitor treatment for MRSA infections. Test performance is not FDA approved in patients less than 59 years old. Performed at [facility], [address] [place], [place] [address]      Time coordinating discharge: 17mins  SIGNED:   Kathie Dike, MD  [facility] 08/15/2021, 8:28 PM   If 7PM-7AM, please contact night-coverage [email or web address]

## 2021-08-18 ENCOUNTER — Inpatient Hospital Stay (HOSPITAL_COMMUNITY): Payer: Medicare HMO

## 2021-08-18 ENCOUNTER — Inpatient Hospital Stay (HOSPITAL_COMMUNITY): Payer: Medicare HMO | Admitting: Hematology

## 2021-08-18 ENCOUNTER — Ambulatory Visit (HOSPITAL_COMMUNITY): Payer: Medicare HMO | Admitting: Physician Assistant

## 2021-08-18 ENCOUNTER — Telehealth: Payer: Self-pay

## 2021-08-18 ENCOUNTER — Other Ambulatory Visit: Payer: Medicare HMO | Admitting: Urology

## 2021-08-18 ENCOUNTER — Other Ambulatory Visit: Payer: Self-pay

## 2021-08-18 ENCOUNTER — Ambulatory Visit (HOSPITAL_COMMUNITY): Payer: Medicare HMO | Admitting: Hematology

## 2021-08-18 VITALS — BP 123/49 | HR 72 | Temp 96.8°F | Resp 18 | Ht 69.29 in | Wt 185.8 lb

## 2021-08-18 DIAGNOSIS — Z79899 Other long term (current) drug therapy: Secondary | ICD-10-CM | POA: Diagnosis not present

## 2021-08-18 DIAGNOSIS — D539 Nutritional anemia, unspecified: Secondary | ICD-10-CM

## 2021-08-18 DIAGNOSIS — D469 Myelodysplastic syndrome, unspecified: Secondary | ICD-10-CM

## 2021-08-18 DIAGNOSIS — R0602 Shortness of breath: Secondary | ICD-10-CM | POA: Diagnosis not present

## 2021-08-18 DIAGNOSIS — N1832 Chronic kidney disease, stage 3b: Secondary | ICD-10-CM | POA: Diagnosis not present

## 2021-08-18 DIAGNOSIS — D631 Anemia in chronic kidney disease: Secondary | ICD-10-CM | POA: Diagnosis not present

## 2021-08-18 DIAGNOSIS — I2510 Atherosclerotic heart disease of native coronary artery without angina pectoris: Secondary | ICD-10-CM | POA: Diagnosis not present

## 2021-08-18 DIAGNOSIS — N183 Chronic kidney disease, stage 3 unspecified: Secondary | ICD-10-CM

## 2021-08-18 DIAGNOSIS — R748 Abnormal levels of other serum enzymes: Secondary | ICD-10-CM | POA: Diagnosis not present

## 2021-08-18 DIAGNOSIS — D509 Iron deficiency anemia, unspecified: Secondary | ICD-10-CM | POA: Diagnosis not present

## 2021-08-18 DIAGNOSIS — D508 Other iron deficiency anemias: Secondary | ICD-10-CM

## 2021-08-18 DIAGNOSIS — J45909 Unspecified asthma, uncomplicated: Secondary | ICD-10-CM | POA: Diagnosis not present

## 2021-08-18 DIAGNOSIS — I10 Essential (primary) hypertension: Secondary | ICD-10-CM | POA: Diagnosis not present

## 2021-08-18 DIAGNOSIS — E785 Hyperlipidemia, unspecified: Secondary | ICD-10-CM | POA: Diagnosis not present

## 2021-08-18 DIAGNOSIS — E119 Type 2 diabetes mellitus without complications: Secondary | ICD-10-CM | POA: Diagnosis not present

## 2021-08-18 DIAGNOSIS — I4891 Unspecified atrial fibrillation: Secondary | ICD-10-CM | POA: Diagnosis not present

## 2021-08-18 DIAGNOSIS — Z794 Long term (current) use of insulin: Secondary | ICD-10-CM | POA: Diagnosis not present

## 2021-08-18 DIAGNOSIS — I129 Hypertensive chronic kidney disease with stage 1 through stage 4 chronic kidney disease, or unspecified chronic kidney disease: Secondary | ICD-10-CM | POA: Diagnosis not present

## 2021-08-18 DIAGNOSIS — D638 Anemia in other chronic diseases classified elsewhere: Secondary | ICD-10-CM

## 2021-08-18 LAB — CBC WITH DIFFERENTIAL/PLATELET
Abs Immature Granulocytes: 0.02 10*3/uL (ref 0.00–0.07)
Basophils Absolute: 0 10*3/uL (ref 0.0–0.1)
Basophils Relative: 0 %
Eosinophils Absolute: 0 10*3/uL (ref 0.0–0.5)
Eosinophils Relative: 1 %
HCT: 27.1 % — ABNORMAL LOW (ref 39.0–52.0)
Hemoglobin: 9 g/dL — ABNORMAL LOW (ref 13.0–17.0)
Immature Granulocytes: 1 %
Lymphocytes Relative: 49 %
Lymphs Abs: 0.9 10*3/uL (ref 0.7–4.0)
MCH: 33.1 pg (ref 26.0–34.0)
MCHC: 33.2 g/dL (ref 30.0–36.0)
MCV: 99.6 fL (ref 80.0–100.0)
Monocytes Absolute: 0.5 10*3/uL (ref 0.1–1.0)
Monocytes Relative: 26 %
Neutro Abs: 0.4 10*3/uL — CL (ref 1.7–7.7)
Neutrophils Relative %: 23 %
Platelets: 281 10*3/uL (ref 150–400)
RBC: 2.72 MIL/uL — ABNORMAL LOW (ref 4.22–5.81)
RDW: 19.5 % — ABNORMAL HIGH (ref 11.5–15.5)
WBC: 1.9 10*3/uL — ABNORMAL LOW (ref 4.0–10.5)
nRBC: 0 % (ref 0.0–0.2)

## 2021-08-18 LAB — SAMPLE TO BLOOD BANK

## 2021-08-18 MED ORDER — EPOETIN ALFA-EPBX 20000 UNIT/ML IJ SOLN
20000.0000 [IU] | Freq: Once | INTRAMUSCULAR | Status: AC
  Administered 2021-08-18: 20000 [IU] via SUBCUTANEOUS
  Filled 2021-08-18: qty 1

## 2021-08-18 MED ORDER — EPOETIN ALFA-EPBX 20000 UNIT/ML IJ SOLN
40000.0000 [IU] | Freq: Once | INTRAMUSCULAR | Status: DC
  Filled 2021-08-18: qty 2

## 2021-08-18 MED ORDER — EPOETIN ALFA-EPBX 40000 UNIT/ML IJ SOLN
40000.0000 [IU] | Freq: Once | INTRAMUSCULAR | Status: AC
  Administered 2021-08-18: 40000 [IU] via SUBCUTANEOUS

## 2021-08-18 NOTE — Telephone Encounter (Signed)
Transition Care Management Unsuccessful Follow-up Telephone Call  Date of discharge and from where:  08/15/21 - Jack Mejia - anemia  Attempts:  1st Attempt  Reason for unsuccessful TCM follow-up call:  Left voice message  *Patient has called and made hosp f/u appt for 08/22/21 @ 10:40 - just need to ask TCM questions.

## 2021-08-18 NOTE — Patient Instructions (Signed)
Neutropenia Neutropenia is a condition that occurs when you have low levels of neutrophils. Neutrophils are a type of white blood cells. They are made in the spongy center of bones (bone marrow). They fight infections. Neutrophils are your body's main defense against infections. The fewer neutrophils you have and the longer your body remains without them, the greater your risk of getting a severe infection. What are the causes? This condition can occur if your body uses up or destroys neutrophils faster than your bone marrow can make them. Neutropenia may be caused by: A bacterial or fungal infection. Allergic disorders. Reactions to some medicines. An autoimmune disease. An enlarged spleen. This condition can also occur if your bone marrow does not produce enough neutrophils. This problem may be caused by: Cancer. Cancer treatments, such as radiation or chemotherapy. Viral infections. Medicines, such as phenytoin. Vitamin B12 deficiency. Diseases of the bone marrow. Environmental toxins, such as insecticides. What are the signs or symptoms? This condition does not usually cause symptoms. If symptoms are present, they are usually caused by an underlying infection. Symptoms of an infection may include: Fever. Chills. Swollen glands. Mouth ulcers. Cough. Rash or skin infection. Skin may be red, swollen, or painful. Abdominal or rectal pain. Frequent urination or pain or burning with urination. Because neutropenia weakens the immune system, symptoms of infection may be reduced. It is important to be aware of any changes in your body and talk to your health care provider. How is this diagnosed? This condition is diagnosed based on your medical history and a physical exam. Tests will also be done, such as: A complete blood count (CBC). Bone marrow biopsy. This is collecting a sample of bone marrow for testing. A chest X-ray. A urine culture. A blood culture. How is this  treated? Treatment depends on the underlying cause and severity of your condition. Mild neutropenia may not require treatment. Treatment may include medicines, such as: Antibiotic medicine given through an IV. Antiviral medicines. Antifungal medicines. A medicine to increase production of neutrophils (colony-stimulating factor). You may get this medicine through an IV or by injection. Steroids given through an IV. If an underlying condition is causing neutropenia, you may need treatment for that condition. If medicines or cancer treatments are causing neutropenia, your health care provider may have you stop the medicines or treatment. Follow these instructions at home: Medicines  Take over-the-counter and prescription medicines only as told by your health care provider. Get an annual flu shot. Ask your health care provider whether you or anyone you live with needs any other vaccines. Eating and drinking Do not share food utensils. Do not eat unpasteurized foods. Do not eat raw or undercooked meat, eggs, or seafood. Do not eat unwashed, raw fruits or vegetables. Lifestyle Avoid exposure to groups of people or children. Avoid being around people who are sick. Avoid being around live plants or fresh flowers. Avoid being around dirt or dust, such as in construction areas or gardens. Wear gloves if you are going to do yard work or gardening. Do not provide direct care for pets. Avoid animal droppings. Do not clean litter boxes and bird cages. Do not have sex unless your health care provider has approved. Hygiene  Bathe daily. Clean the area between the genitals and the anus (perineal area) after you urinate or have a bowel movement. If you are male, wipe from front to back. Get regular dental care and brush your teeth with a soft toothbrush before and after meals. Do not use  a regular razor. Use an electric razor to remove hair. Wash your hands often with soap and water for at least 20  seconds. Make sure others who come in contact with you also wash their hands. If soap and water are not available, use hand sanitizer. General instructions Take steps to reduce your risk of injury or infection. Follow any precautions as told by your health care provider. Take actions to avoid cuts and burns. For example: Be cautious when you use knives. Always cut away from yourself. Keep knives in protective sheaths or guards when not in use. Use oven mitts when you cook with a hot stove, oven, or grill. Stand a safe distance away from open fires. Do not use tampons, enemas, or rectal suppositories unless your health care provider has approved. Keep all follow-up visits. This is important. Contact a health care provider if: You have a cough. You have a sore throat. You develop sores in your mouth or anus. You have a warm, red, or tender area on your skin. You have red streaks on the skin. You develop a rash. You have swollen lymph nodes. You have frequent or painful urination. You have vaginal discharge or itching. Get help right away if: You have a fever. You have chills or shaking. You have nausea or vomiting. You have a lot of fatigue. You have shortness of breath. Summary Neutropenia is a condition that occurs when you have a lower-than-normal level of a type of white blood cell (neutrophils) in your body. This condition can occur if your body uses up or destroys neutrophils faster than your bone marrow can make them. Treatment depends on the underlying cause and severity of your condition. Mild neutropenia may not require treatment. Follow any precautions as told by your health care provider to reduce your risk for injury or infection. This information is not intended to replace advice given to you by your health care provider. Make sure you discuss any questions you have with your health care provider. Document Revised: 01/08/2021 Document Reviewed: 01/08/2021 Elsevier Patient  Education  [place].

## 2021-08-18 NOTE — Progress Notes (Signed)
Retacrit 60,000 units given verbal order Tarri Abernethy, PA.    Hemoglobin reviewed prior to administration. VSS. Injection tolerated without incident or complaint. See MAR for details. Patient stable during and after injection.  Patient discharged in satisfactory condition with no s/s of distress noted.

## 2021-08-18 NOTE — Patient Instructions (Signed)
[place] at [facility] Discharge Instructions  You were seen today by Tarri Abernethy PA-C for your anemia.   - Your anemia (low blood counts) caused by your myelodysplastic syndrome ("MDS"), which is a type of bone marrow/blood cancer. - Your blood counts have been much lower than usual lately, which may be a sign of progression of your MDS. - We will give you high-dose Retacrit (60,000 units) each week. - If your blood counts do not respond to this high-dose Retacrit, we will need to perform another bone marrow biopsy.  LABS: Return every week for repeat blood check.  TREATMENT: Retacrit injection once per week (same day as labs)  FOLLOW-UP APPOINTMENT: Office visit in 4 weeks   Thank you for choosing [place] at [facility] to provide your oncology and hematology care.  To afford each patient quality time with our provider, please arrive at least 15 minutes before your scheduled appointment time.   If you have a lab appointment with the [facility] please come in thru the Main Entrance and check in at the main information desk.  You need to re-schedule your appointment should you arrive 10 or more minutes late.  We strive to give you quality time with our providers, and arriving late affects you and other patients whose appointments are after yours.  Also, if you no show three or more times for appointments you may be dismissed from the clinic at the providers discretion.     Again, thank you for choosing [facility]  Our hope is that these requests will decrease the amount of time that you wait before being seen by our physicians.       _____________________________________________________________  Should you have questions after your visit to [facility], please contact our office at [phone] and follow the prompts.  Our office hours are 8:00 a.m. and 4:30 p.m. Monday - Friday.  Please note that  voicemails left after 4:00 p.m. may not be returned until the following business day.  We are closed weekends and major holidays.  You do have access to a nurse 24-7, just call the main number to the clinic [phone] and do not press any options, hold on the line and a nurse will answer the phone.    For prescription refill requests, have your pharmacy contact our office and allow 72 hours.    Due to Covid, you will need to wear a mask upon entering the hospital. If you do not have a mask, a mask will be given to you at the Main Entrance upon arrival. For doctor visits, patients may have 1 support person age 60 or older with them. For treatment visits, patients can not have anyone with them due to social distancing guidelines and our immunocompromised population.

## 2021-08-18 NOTE — Progress Notes (Signed)
CRITICAL VALUE ALERT Critical value received: Jack Mejia Date of notification:  08-18-21 Time of notification: 0913 Critical value read back:  Yes.   Nurse who received alert:  C. Azekiel Cremer RN MD notified time and response:  [phone] , PA notified.

## 2021-08-19 NOTE — Telephone Encounter (Signed)
Transition Care Management Follow-up Telephone Call Date of discharge and from where: 08/15/21 - [facility] How have you been since you were released from the hospital? Doing much better today - getting strength back Any questions or concerns? No  Items Reviewed: Did the pt receive and understand the discharge instructions provided? Yes  Medications obtained and verified? Yes  Other? No  Any new allergies since your discharge? No  Dietary orders reviewed? Yes Do you have support at home? Yes   Home Care and Equipment/Supplies: Were home health services ordered? no If so, what is the name of the agency? N/a  Has the agency set up a time to come to the patient's home? not applicable Were any new equipment or medical supplies ordered?  No What is the name of the medical supply agency? N/a Were you able to get the supplies/equipment? not applicable Do you have any questions related to the use of the equipment or supplies? No  Functional Questionnaire: (I = Independent and D = Dependent) ADLs: I  Bathing/Dressing- I  Meal Prep- I  Eating- I  Maintaining continence- I  Transferring/Ambulation- I  Managing Meds- I  Follow up appointments reviewed:  PCP Hospital f/u appt confirmed? Yes  Scheduled to see Dr Lacinda Axon on 08/22/21 @ 10:40. [facility] f/u appt confirmed? Yes  Scheduled to see Urology on 09/27/21 @ 9 and waiting for call back from Cardiology for f/u with them Are transportation arrangements needed? No  If their condition worsens, is the pt aware to call PCP or go to the Emergency Dept.? Yes Was the patient provided with contact information for the PCP's office or ED? Yes Was to pt encouraged to call back with questions or concerns? Yes

## 2021-08-22 ENCOUNTER — Other Ambulatory Visit: Payer: Self-pay

## 2021-08-22 ENCOUNTER — Encounter: Payer: Self-pay | Admitting: Family Medicine

## 2021-08-22 ENCOUNTER — Ambulatory Visit (INDEPENDENT_AMBULATORY_CARE_PROVIDER_SITE_OTHER): Payer: Medicare HMO | Admitting: Family Medicine

## 2021-08-22 DIAGNOSIS — D469 Myelodysplastic syndrome, unspecified: Secondary | ICD-10-CM

## 2021-08-22 DIAGNOSIS — D638 Anemia in other chronic diseases classified elsewhere: Secondary | ICD-10-CM

## 2021-08-22 DIAGNOSIS — E611 Iron deficiency: Secondary | ICD-10-CM | POA: Insufficient documentation

## 2021-08-22 NOTE — Patient Instructions (Signed)
Continue your current medications.  Follow up in 3-6 months.  Take care  Dr. Cristie Mckinney  

## 2021-08-24 NOTE — Assessment & Plan Note (Signed)
Stable following hospitalization. Following closely with Hematology. CBC improved. Continue current meds. Waiting to see if Hb stays stable or whether he will need additional bone marrow biopsy.

## 2021-08-24 NOTE — Progress Notes (Signed)
Subjective:  Patient ID: Jack Mejia, male    DOB: March 16, 1946  Age: 76 y.o. MRN: [id number]  CC: Chief Complaint  Patient presents with   Hospitalization Follow-up    HPI:  76 year old male with an extensive PMH presents for hospital follow up.  Patient admitted from 1/17 - 1/20. Hospital course, discharge summary reviewed.  Summary: Presented with fatigue and SOB. Found to have Hb of 4.5. Negative work up for GI bleed. Given PRBC's. After 2nd unit developed respiratory distress and flash pulmonary edema which required BiPAP and diuresis.  Heme/Onc believes anemia due to MDS.  Anticoagulant is being held.  Patient presents today and feels fatigued. He appears depressed and states that he is very frustrated. He is unsure of whether he wants to continue treatments if they are ineffective. He has been seen by Heme/Onc for follow up. He is high dose EPO (thought to have EPO resistance). May need bone marrow biopsy in the future.  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   Hyperlipidemia associated with type 2 diabetes mellitus ([place]) 07/11/2020   Type 2 diabetes mellitus ([place]) 38/18/2993   Diastolic CHF ([place]) 71/69/6789   PAF (paroxysmal atrial fibrillation) ([place]) 06/06/2020   Anemia of chronic disease 01/01/2020   MDS (myelodysplastic syndrome) ([place]) 12/14/2019   Coronary artery disease involving native coronary artery of native heart without angina pectoris    CKD (chronic kidney disease) stage 3, GFR 30-59 ml/min (HCC) 04/21/2019   Depression, major, single episode, mild ([place]) 03/01/2017   Erectile dysfunction 12/05/2012   Essential hypertension 07/07/2011   Fatty liver 07/07/2011    Social Hx   Social History   Socioeconomic History   Marital status: Divorced    Spouse name: Not on file    Number of children: 2   Years of education: Not on file   Highest education level: Not on file  Occupational History   Occupation: RETIRED  Tobacco Use   Smoking status: Former    Packs/day: 4.00    Years: 20.00    Pack years: 80.00    Types: Cigarettes    Quit date: 10/14/1982    Years since quitting: 38.8   Smokeless tobacco: Never   Tobacco comments:    29 yrs ago  Vaping Use   Vaping Use: Never used  Substance and Sexual Activity   Alcohol use: No   Drug use: No   Sexual activity: Not Currently    Birth control/protection: None  Other Topics Concern   Not on file  Social History Narrative   Not on file   Social Determinants of Health   Financial Resource Strain: Medium Risk   Difficulty of Paying Living Expenses: Somewhat hard  Food Insecurity: No Food Insecurity   Worried About Charity fundraiser in the Last Year: Never true   Ran Out of Food in the Last Year: Never true  Transportation Needs: No Transportation Needs   Lack of Transportation (Medical): No   Lack of Transportation (Non-Medical): No  Physical Activity: Insufficiently Active   Days of Exercise per Week: 4 days   Minutes of Exercise per Session: 10 min  Stress: No Stress Concern Present   Feeling of Stress : Not at all  Social Connections: Moderately Isolated   Frequency of Communication with Friends and Family: More than  three times a week   Frequency of Social Gatherings with Friends and Family: More than three times a week   Attends Religious Services: More than 4 times per year   Active Member of [employer] or Organizations: No   Attends Music therapist: Never   Marital Status: Separated    Review of Systems Per HPI  Objective:  BP 126/68    Pulse 66    Temp 97.8 F (36.6 C)    Wt 187 lb 6.4 oz (85 kg)    SpO2 99%    BMI 27.44 kg/m   BP/Weight 08/22/2021 08/18/2021 3/83/2919  Systolic BP 166 060 045  Diastolic BP 68 49 54  Wt. (Lbs) 187.4 185.85 184  BMI 27.44 27.21 27.17     Physical Exam Vitals and nursing note reviewed.  Constitutional:      General: He is not in acute distress. HENT:     Head: Normocephalic and atraumatic.  Eyes:     Conjunctiva/sclera: Conjunctivae normal.  Cardiovascular:     Rate and Rhythm: Normal rate and regular rhythm.     Heart sounds: Murmur heard.  Pulmonary:     Effort: Pulmonary effort is normal. No respiratory distress.  Neurological:     Mental Status: He is alert.  Psychiatric:     Comments: Flat affect, depressed mood.    Lab Results  Component Value Date   WBC 1.9 (L) 08/18/2021   HGB 9.0 (L) 08/18/2021   HCT 27.1 (L) 08/18/2021   PLT 281 08/18/2021   GLUCOSE 175 (H) 08/14/2021   CHOL 173 05/04/2018   TRIG 278 (H) 05/04/2018   HDL 34 (L) 05/04/2018   LDLCALC 83 05/04/2018   ALT 27 08/14/2021   AST 25 08/14/2021   NA 137 08/14/2021   K 3.4 (L) 08/14/2021   CL 102 08/14/2021   CREATININE 1.92 (H) 08/14/2021   BUN 50 (H) 08/14/2021   CO2 23 08/14/2021   TSH 0.490 06/06/2020   PSA 0.44 02/21/2014   INR 2.7 (H) 06/24/2020   HGBA1C 6.9 (H) 08/12/2021   MICROALBUR 10.08 (H) 02/21/2014     Assessment & Plan:   Problem List Items Addressed This Visit       Other   MDS (myelodysplastic syndrome) ([place])    Stable following hospitalization. Following closely with Hematology. CBC improved. Continue current meds. Waiting to see if Hb stays stable or whether he will need additional bone marrow biopsy.      Follow-up:  3 months  [place]

## 2021-08-25 ENCOUNTER — Other Ambulatory Visit: Payer: Medicare HMO | Admitting: Urology

## 2021-08-25 ENCOUNTER — Other Ambulatory Visit: Payer: Self-pay

## 2021-08-25 ENCOUNTER — Inpatient Hospital Stay (HOSPITAL_COMMUNITY): Payer: Medicare HMO

## 2021-08-25 VITALS — BP 129/58 | HR 74 | Temp 96.8°F | Resp 17 | Ht 69.25 in | Wt 187.4 lb

## 2021-08-25 DIAGNOSIS — D469 Myelodysplastic syndrome, unspecified: Secondary | ICD-10-CM

## 2021-08-25 DIAGNOSIS — I129 Hypertensive chronic kidney disease with stage 1 through stage 4 chronic kidney disease, or unspecified chronic kidney disease: Secondary | ICD-10-CM | POA: Diagnosis not present

## 2021-08-25 DIAGNOSIS — N1832 Chronic kidney disease, stage 3b: Secondary | ICD-10-CM | POA: Diagnosis not present

## 2021-08-25 DIAGNOSIS — R0602 Shortness of breath: Secondary | ICD-10-CM | POA: Diagnosis not present

## 2021-08-25 DIAGNOSIS — N183 Chronic kidney disease, stage 3 unspecified: Secondary | ICD-10-CM

## 2021-08-25 DIAGNOSIS — D509 Iron deficiency anemia, unspecified: Secondary | ICD-10-CM | POA: Diagnosis not present

## 2021-08-25 DIAGNOSIS — I4891 Unspecified atrial fibrillation: Secondary | ICD-10-CM | POA: Diagnosis not present

## 2021-08-25 DIAGNOSIS — Z79899 Other long term (current) drug therapy: Secondary | ICD-10-CM | POA: Diagnosis not present

## 2021-08-25 DIAGNOSIS — J45909 Unspecified asthma, uncomplicated: Secondary | ICD-10-CM | POA: Diagnosis not present

## 2021-08-25 DIAGNOSIS — I2510 Atherosclerotic heart disease of native coronary artery without angina pectoris: Secondary | ICD-10-CM | POA: Diagnosis not present

## 2021-08-25 DIAGNOSIS — D638 Anemia in other chronic diseases classified elsewhere: Secondary | ICD-10-CM

## 2021-08-25 DIAGNOSIS — D631 Anemia in chronic kidney disease: Secondary | ICD-10-CM | POA: Diagnosis not present

## 2021-08-25 LAB — CBC WITH DIFFERENTIAL/PLATELET
Abs Immature Granulocytes: 0.01 10*3/uL (ref 0.00–0.07)
Basophils Absolute: 0 10*3/uL (ref 0.0–0.1)
Basophils Relative: 0 %
Eosinophils Absolute: 0 10*3/uL (ref 0.0–0.5)
Eosinophils Relative: 0 %
HCT: 24.4 % — ABNORMAL LOW (ref 39.0–52.0)
Hemoglobin: 8.4 g/dL — ABNORMAL LOW (ref 13.0–17.0)
Immature Granulocytes: 1 %
Lymphocytes Relative: 48 %
Lymphs Abs: 0.6 10*3/uL — ABNORMAL LOW (ref 0.7–4.0)
MCH: 34.1 pg — ABNORMAL HIGH (ref 26.0–34.0)
MCHC: 34.4 g/dL (ref 30.0–36.0)
MCV: 99.2 fL (ref 80.0–100.0)
Monocytes Absolute: 0.3 10*3/uL (ref 0.1–1.0)
Monocytes Relative: 27 %
Neutro Abs: 0.3 10*3/uL — CL (ref 1.7–7.7)
Neutrophils Relative %: 24 %
Platelets: 199 10*3/uL (ref 150–400)
RBC: 2.46 MIL/uL — ABNORMAL LOW (ref 4.22–5.81)
RDW: 18.5 % — ABNORMAL HIGH (ref 11.5–15.5)
WBC: 1.3 10*3/uL — CL (ref 4.0–10.5)
nRBC: 0 % (ref 0.0–0.2)

## 2021-08-25 LAB — SAMPLE TO BLOOD BANK

## 2021-08-25 MED ORDER — EPOETIN ALFA-EPBX 20000 UNIT/ML IJ SOLN
60000.0000 [IU] | Freq: Once | INTRAMUSCULAR | Status: AC
  Administered 2021-08-25: 60000 [IU] via SUBCUTANEOUS
  Filled 2021-08-25: qty 3

## 2021-08-25 NOTE — Progress Notes (Signed)
CRITICAL VALUE ALERT Critical value received:  WBC 1.3, ANC 0.3 Date of notification:  08-25-2021 Time of notification: 10:33 am Critical value read back:  Yes.   Nurse who received alert:  B. Hisham Provence RN  MD notified time and response:  Dr. Delton Coombes / A. Beckie Salts.

## 2021-08-25 NOTE — Patient Instructions (Signed)
[facility]  Discharge Instructions: Thank you for choosing [facility] to provide your oncology and hematology care.  If you have a lab appointment with the Cancer Center, please come in thru the Main Entrance and check in at the main information desk.  Wear comfortable clothing and clothing appropriate for easy access to any Portacath or PICC line.   We strive to give you quality time with your provider. You may need to reschedule your appointment if you arrive late (15 or more minutes).  Arriving late affects you and other patients whose appointments are after yours.  Also, if you miss three or more appointments without notifying the office, you may be dismissed from the clinic at the provider's discretion.      For prescription refill requests, have your pharmacy contact our office and allow 72 hours for refills to be completed.    Today you received the following chemotherapy and/or immunotherapy agents Retacrit      To help prevent nausea and vomiting after your treatment, we encourage you to take your nausea medication as directed.  BELOW ARE SYMPTOMS THAT SHOULD BE REPORTED IMMEDIATELY: *FEVER GREATER THAN 100.4 F (38 C) OR HIGHER *CHILLS OR SWEATING *NAUSEA AND VOMITING THAT IS NOT CONTROLLED WITH YOUR NAUSEA MEDICATION *UNUSUAL SHORTNESS OF BREATH *UNUSUAL BRUISING OR BLEEDING *URINARY PROBLEMS (pain or burning when urinating, or frequent urination) *BOWEL PROBLEMS (unusual diarrhea, constipation, pain near the anus) TENDERNESS IN MOUTH AND THROAT WITH OR WITHOUT PRESENCE OF ULCERS (sore throat, sores in mouth, or a toothache) UNUSUAL RASH, SWELLING OR PAIN  UNUSUAL VAGINAL DISCHARGE OR ITCHING   Items with * indicate a potential emergency and should be followed up as soon as possible or go to the Emergency Department if any problems should occur.  Please show the CHEMOTHERAPY ALERT CARD or IMMUNOTHERAPY ALERT CARD at check-in to the Emergency  Department and triage nurse.  Should you have questions after your visit or need to cancel or reschedule your appointment, please contact [facility] [phone]  and follow the prompts.  Office hours are 8:00 a.m. to 4:30 p.m. Monday - Friday. Please note that voicemails left after 4:00 p.m. may not be returned until the following business day.  We are closed weekends and major holidays. You have access to a nurse at all times for urgent questions. Please call the main number to the clinic [phone] and follow the prompts.  For any non-urgent questions, you may also contact your provider using MyChart. We now offer e-Visits for anyone 18 and older to request care online for non-urgent symptoms. For details visit [email or web address].   Also download the MyChart app! Go to the app store, search "MyChart", open the app, select [facility], and log in with your MyChart username and password.  Due to Covid, a mask is required upon entering the hospital/clinic. If you do not have a mask, one will be given to you upon arrival. For doctor visits, patients may have 1 support person aged 18 or older with them. For treatment visits, patients cannot have anyone with them due to current Covid guidelines and our immunocompromised population.  

## 2021-08-25 NOTE — Progress Notes (Signed)
Jack Mejia presents today for Retacrit injection per the provider's orders.  Stable during administration without incident; injection site WNL; see MAR for injection details.  Patient tolerated procedure well and without incident.  No questions or complaints noted at this time.  Discharge from clinic via wheelchair in stable condition.  Alert and oriented X 3.  Follow up with [facility] as scheduled.

## 2021-08-27 ENCOUNTER — Ambulatory Visit (HOSPITAL_COMMUNITY): Payer: Medicare HMO

## 2021-08-27 ENCOUNTER — Other Ambulatory Visit (HOSPITAL_COMMUNITY): Payer: Medicare HMO

## 2021-08-28 ENCOUNTER — Ambulatory Visit (HOSPITAL_COMMUNITY): Payer: Medicare HMO

## 2021-08-28 ENCOUNTER — Other Ambulatory Visit (HOSPITAL_COMMUNITY): Payer: Medicare HMO

## 2021-09-01 ENCOUNTER — Other Ambulatory Visit: Payer: Self-pay

## 2021-09-01 ENCOUNTER — Inpatient Hospital Stay (HOSPITAL_COMMUNITY): Payer: Medicare HMO | Attending: Hematology

## 2021-09-01 ENCOUNTER — Encounter (HOSPITAL_COMMUNITY): Payer: Self-pay

## 2021-09-01 ENCOUNTER — Inpatient Hospital Stay (HOSPITAL_COMMUNITY): Payer: Medicare HMO

## 2021-09-01 ENCOUNTER — Inpatient Hospital Stay (HOSPITAL_BASED_OUTPATIENT_CLINIC_OR_DEPARTMENT_OTHER): Payer: Medicare HMO | Admitting: Physician Assistant

## 2021-09-01 VITALS — BP 112/62 | HR 52 | Temp 96.0°F | Resp 18

## 2021-09-01 DIAGNOSIS — Z809 Family history of malignant neoplasm, unspecified: Secondary | ICD-10-CM | POA: Insufficient documentation

## 2021-09-01 DIAGNOSIS — R5383 Other fatigue: Secondary | ICD-10-CM | POA: Insufficient documentation

## 2021-09-01 DIAGNOSIS — D509 Iron deficiency anemia, unspecified: Secondary | ICD-10-CM | POA: Diagnosis not present

## 2021-09-01 DIAGNOSIS — D469 Myelodysplastic syndrome, unspecified: Secondary | ICD-10-CM | POA: Insufficient documentation

## 2021-09-01 DIAGNOSIS — I2510 Atherosclerotic heart disease of native coronary artery without angina pectoris: Secondary | ICD-10-CM | POA: Insufficient documentation

## 2021-09-01 DIAGNOSIS — N1832 Chronic kidney disease, stage 3b: Secondary | ICD-10-CM | POA: Diagnosis not present

## 2021-09-01 DIAGNOSIS — E119 Type 2 diabetes mellitus without complications: Secondary | ICD-10-CM | POA: Insufficient documentation

## 2021-09-01 DIAGNOSIS — Z87891 Personal history of nicotine dependence: Secondary | ICD-10-CM | POA: Insufficient documentation

## 2021-09-01 DIAGNOSIS — M129 Arthropathy, unspecified: Secondary | ICD-10-CM | POA: Insufficient documentation

## 2021-09-01 DIAGNOSIS — Z7901 Long term (current) use of anticoagulants: Secondary | ICD-10-CM | POA: Diagnosis not present

## 2021-09-01 DIAGNOSIS — I129 Hypertensive chronic kidney disease with stage 1 through stage 4 chronic kidney disease, or unspecified chronic kidney disease: Secondary | ICD-10-CM | POA: Insufficient documentation

## 2021-09-01 DIAGNOSIS — G4700 Insomnia, unspecified: Secondary | ICD-10-CM | POA: Diagnosis not present

## 2021-09-01 DIAGNOSIS — D709 Neutropenia, unspecified: Secondary | ICD-10-CM | POA: Insufficient documentation

## 2021-09-01 DIAGNOSIS — N183 Chronic kidney disease, stage 3 unspecified: Secondary | ICD-10-CM

## 2021-09-01 DIAGNOSIS — I4891 Unspecified atrial fibrillation: Secondary | ICD-10-CM | POA: Insufficient documentation

## 2021-09-01 DIAGNOSIS — E785 Hyperlipidemia, unspecified: Secondary | ICD-10-CM | POA: Diagnosis not present

## 2021-09-01 DIAGNOSIS — R0602 Shortness of breath: Secondary | ICD-10-CM | POA: Diagnosis not present

## 2021-09-01 DIAGNOSIS — Z79899 Other long term (current) drug therapy: Secondary | ICD-10-CM | POA: Diagnosis not present

## 2021-09-01 DIAGNOSIS — D631 Anemia in chronic kidney disease: Secondary | ICD-10-CM | POA: Diagnosis not present

## 2021-09-01 DIAGNOSIS — D638 Anemia in other chronic diseases classified elsewhere: Secondary | ICD-10-CM

## 2021-09-01 DIAGNOSIS — R948 Abnormal results of function studies of other organs and systems: Secondary | ICD-10-CM | POA: Diagnosis not present

## 2021-09-01 LAB — CBC WITH DIFFERENTIAL/PLATELET
Abs Immature Granulocytes: 0 10*3/uL (ref 0.00–0.07)
Basophils Absolute: 0 10*3/uL (ref 0.0–0.1)
Basophils Relative: 0 %
Eosinophils Absolute: 0 10*3/uL (ref 0.0–0.5)
Eosinophils Relative: 1 %
HCT: 19.9 % — ABNORMAL LOW (ref 39.0–52.0)
Hemoglobin: 6.5 g/dL — CL (ref 13.0–17.0)
Immature Granulocytes: 0 %
Lymphocytes Relative: 58 %
Lymphs Abs: 0.7 10*3/uL (ref 0.7–4.0)
MCH: 31.3 pg (ref 26.0–34.0)
MCHC: 32.7 g/dL (ref 30.0–36.0)
MCV: 95.7 fL (ref 80.0–100.0)
Monocytes Absolute: 0.3 10*3/uL (ref 0.1–1.0)
Monocytes Relative: 25 %
Neutro Abs: 0.2 10*3/uL — CL (ref 1.7–7.7)
Neutrophils Relative %: 16 %
Platelets: 212 10*3/uL (ref 150–400)
RBC: 2.08 MIL/uL — ABNORMAL LOW (ref 4.22–5.81)
RDW: 18.1 % — ABNORMAL HIGH (ref 11.5–15.5)
WBC: 1.2 10*3/uL — CL (ref 4.0–10.5)
nRBC: 0 % (ref 0.0–0.2)

## 2021-09-01 LAB — PREPARE RBC (CROSSMATCH)

## 2021-09-01 MED ORDER — EPOETIN ALFA-EPBX 20000 UNIT/ML IJ SOLN
60000.0000 [IU] | Freq: Once | INTRAMUSCULAR | Status: AC
  Administered 2021-09-01: 60000 [IU] via SUBCUTANEOUS
  Filled 2021-09-01: qty 3

## 2021-09-01 MED ORDER — ACETAMINOPHEN 325 MG PO TABS
650.0000 mg | ORAL_TABLET | Freq: Once | ORAL | Status: AC
  Administered 2021-09-01: 650 mg via ORAL
  Filled 2021-09-01: qty 2

## 2021-09-01 MED ORDER — SODIUM CHLORIDE 0.9% IV SOLUTION
250.0000 mL | Freq: Once | INTRAVENOUS | Status: AC
  Administered 2021-09-01: 250 mL via INTRAVENOUS

## 2021-09-01 MED ORDER — DIPHENHYDRAMINE HCL 25 MG PO CAPS
25.0000 mg | ORAL_CAPSULE | Freq: Once | ORAL | Status: AC
  Administered 2021-09-01: 25 mg via ORAL
  Filled 2021-09-01: qty 1

## 2021-09-01 NOTE — Progress Notes (Signed)
[facility], [place] [address]   CLINIC:  Medical Oncology/Hematology  PCP:  Coral Spikes, DO [place] [address] [phone]   REASON FOR VISIT:  Follow-up for MDS   CURRENT THERAPY: PRBC transfusions as needed, intermittent IV iron infusions, oral iron supplementation, Retacrit   INTERVAL HISTORY:  Jack Mejia 76 y.o. male returns for routine follow-up of his myelodysplastic syndrome.  He is seen today for follow-up after recent hospital discharge.  He was last seen in clinic by Dr. Delton Coombes and Tarri Abernethy PA-C on 08/18/2021.  At today's visit, he reports feeling poorly.  He is receiving blood transfusion today due to Hgb 6.5.  He is noted to have critical neutropenia with ANC 0.2 and WBC 1.2.  He has significant fatigue, dyspnea on exertion, and lightheadedness.  He denies any fever, chills, night sweats, weight loss.  He has 25% energy and 100% appetite. He endorses that he is maintaining a stable weight.   REVIEW OF SYSTEMS:  Review of Systems  Constitutional:  Positive for fatigue. Negative for appetite change, chills, diaphoresis, fever and unexpected weight change.  HENT:   Negative for lump/mass.   Eyes:  Negative for eye problems.  Respiratory:  Positive for shortness of breath (with exertion). Negative for cough and hemoptysis.   Cardiovascular:  Negative for chest pain, leg swelling and palpitations.  Gastrointestinal:  Positive for nausea. Negative for abdominal pain, blood in stool, constipation, diarrhea and vomiting.  Skin: Negative.   Neurological:  Positive for dizziness, headaches and numbness (tingling in fingers). Negative for light-headedness.  Hematological:  Does not bruise/bleed easily.  Psychiatric/Behavioral:  Positive for depression and sleep disturbance.      PAST MEDICAL/SURGICAL HISTORY:  Past Medical History:  Diagnosis Date   Anemia of chronic disease 01/01/2020   Anxiety     Arthritis    Asthma    Atrial fibrillation ([place])    a. s/p DCCV in 03/2019   CAD (coronary artery disease) 06/29/2019   Cramps of left lower extremity    Depression    Diabetes mellitus    type 2 for 7-8 yrs   Dyspnea    Elevated liver enzymes    Fatty liver    Gout    History of kidney stones    Hyperlipidemia    Hypertension    Renal insufficiency    Vertigo    Past Surgical History:  Procedure Laterality Date   BIOPSY  10/11/2020   Procedure: BIOPSY;  Surgeon: Harvel Quale, MD;  [facility];  Service: Gastroenterology;;   CARDIOVERSION N/A 04/21/2019   Procedure: CARDIOVERSION;  Surgeon: Sanda Klein, MD;  Location: [facility];  Service: Cardiovascular;  Laterality: N/A;   CARPAL TUNNEL RELEASE     both wrist   cataract surgery     bilateral   CHOLECYSTECTOMY     COLONOSCOPY  2011   COLONOSCOPY WITH PROPOFOL N/A 10/11/2020   Procedure: COLONOSCOPY WITH PROPOFOL;  Surgeon: Harvel Quale, MD;  [facility];  Service: Gastroenterology;  Laterality: N/A;  AM   ESOPHAGOGASTRODUODENOSCOPY (EGD) WITH PROPOFOL N/A 10/11/2020   Procedure: ESOPHAGOGASTRODUODENOSCOPY (EGD) WITH PROPOFOL;  Surgeon: Harvel Quale, MD;  [facility];  Service: Gastroenterology;  Laterality: N/A;   EYE SURGERY     HEMORROIDECTOMY     KNEE SURGERY Left    LEFT HEART CATH AND CORONARY ANGIOGRAPHY N/A 05/23/2019   Procedure: LEFT HEART CATH AND  CORONARY ANGIOGRAPHY;  Surgeon: Belva Crome, MD;  Location: [facility];  Service: Cardiovascular;  Laterality: N/A;   LUMBAR LAMINECTOMY/DECOMPRESSION MICRODISCECTOMY N/A 08/06/2016   Procedure: LUMBAR THREE- LUMBAR FIVE  DECOMPRESSIVE LUMBAR LAMINECTOMY;  Surgeon: Jovita Gamma, MD;  Location: [facility];  Service: Neurosurgery;  Laterality: N/A;   POLYPECTOMY  10/11/2020   Procedure: POLYPECTOMY;  Surgeon: Montez Morita, Quillian Quince, MD;  [facility];  Service:  Gastroenterology;;     SOCIAL HISTORY:  Social History   Socioeconomic History   Marital status: Divorced    Spouse name: Not on file   Number of children: 2   Years of education: Not on file   Highest education level: Not on file  Occupational History   Occupation: RETIRED  Tobacco Use   Smoking status: Former    Packs/day: 4.00    Years: 20.00    Pack years: 80.00    Types: Cigarettes    Quit date: 10/14/1982    Years since quitting: 38.9   Smokeless tobacco: Never   Tobacco comments:    29 yrs ago  Vaping Use   Vaping Use: Never used  Substance and Sexual Activity   Alcohol use: No   Drug use: No   Sexual activity: Not Currently    Birth control/protection: None  Other Topics Concern   Not on file  Social History Narrative   Not on file   Social Determinants of Health   Financial Resource Strain: Medium Risk   Difficulty of Paying Living Expenses: Somewhat hard  Food Insecurity: No Food Insecurity   Worried About Charity fundraiser in the Last Year: Never true   Ran Out of Food in the Last Year: Never true  Transportation Needs: No Transportation Needs   Lack of Transportation (Medical): No   Lack of Transportation (Non-Medical): No  Physical Activity: Insufficiently Active   Days of Exercise per Week: 4 days   Minutes of Exercise per Session: 10 min  Stress: No Stress Concern Present   Feeling of Stress : Not at all  Social Connections: Moderately Isolated   Frequency of Communication with Friends and Family: More than three times a week   Frequency of Social Gatherings with Friends and Family: More than three times a week   Attends Religious Services: More than 4 times per year   Active Member of [employer] or Organizations: No   Attends Music therapist: Never   Marital Status: Separated  Human resources officer Violence: Not At Risk   Fear of Current or Ex-Partner: No   Emotionally Abused: No   Physically Abused: No   Sexually Abused: No     FAMILY HISTORY:  Family History  Problem Relation Age of Onset   Diabetes Mother    Cancer Mother        unknown kind   Diabetes Sister    COPD Sister    Liver disease Brother    COPD Brother    Diabetes Maternal Grandmother    Diabetes Brother    COPD Sister    Healthy Daughter    Healthy Daughter    Colon cancer Neg Hx     CURRENT MEDICATIONS:  Outpatient Encounter Medications as of 09/01/2021  Medication Sig   acetaminophen (TYLENOL) 650 MG CR tablet Take 650 mg by mouth every 8 (eight) hours as needed for pain.   allopurinol (ZYLOPRIM) 300 MG tablet TAKE 1 TABLET (300 MG TOTAL) BY MOUTH DAILY. START THIS AFTER GOUT FLARE.  atorvastatin (LIPITOR) 20 MG tablet TAKE 1 TABLET BY MOUTH EVERY DAY   augmented betamethasone dipropionate (DIPROLENE-AF) 0.05 % cream Apply topically.   BD PEN NEEDLE NANO 2ND GEN 32G X 4 MM MISC Use with insulin pen qid.   diltiazem (CARDIZEM CD) 360 MG 24 hr capsule TAKE 1 CAPSULE BY MOUTH EVERY DAY   empagliflozin (JARDIANCE) 10 MG TABS tablet Take 1 tablet (10 mg total) by mouth daily before breakfast.   ferrous sulfate 325 (65 FE) MG tablet Take 325 mg by mouth. Takes one every other day   LEVEMIR FLEXTOUCH 100 UNIT/ML FlexTouch Pen Inject 24 Units into the skin at bedtime.   metoprolol tartrate (LOPRESSOR) 25 MG tablet Take 1 tablet (25 mg total) by mouth 2 (two) times daily.   NOVOLOG FLEXPEN 100 UNIT/ML FlexPen INJECT 15 UNITS INTO THE SKIN 3 (THREE) TIMES DAILY WITH MEALS   ONETOUCH VERIO test strip TEST TWICE DAILY AS DIRECTED   potassium chloride (KLOR-CON) 10 MEQ tablet TAKE 1 TABLET BY MOUTH EVERY DAY   torsemide (DEMADEX) 20 MG tablet TAKE 1 TABLET BY MOUTH TWICE A DAY   traMADol (ULTRAM) 50 MG tablet Take 1-2 tab p.o. q8hrs prn pain (Patient taking differently: 50-100 mg every 6 (six) hours as needed for moderate pain.)   [EXPIRED] epoetin alfa-epbx (RETACRIT) injection 60,000 Units    No facility-administered encounter medications on  file as of 09/01/2021.    ALLERGIES:  Allergies  Allergen Reactions   Demerol Nausea And Vomiting   Meperidine Hcl Nausea And Vomiting   Vasotec [Enalapril] Cough   Lasix [Furosemide] Rash     PHYSICAL EXAM:  ECOG PERFORMANCE STATUS: 2 - Symptomatic, <50% confined to bed  There were no vitals filed for this visit. There were no vitals filed for this visit. Physical Exam Vitals reviewed.  Constitutional:      Appearance: Normal appearance.     Comments: Somewhat tired-appearing  Cardiovascular:     Rate and Rhythm: Normal rate and regular rhythm.     Pulses: Normal pulses.     Heart sounds: Normal heart sounds.  Pulmonary:     Effort: Pulmonary effort is normal.     Breath sounds: Normal breath sounds.  Neurological:     General: No focal deficit present.     Mental Status: He is alert and oriented to person, place, and time.  Psychiatric:        Mood and Affect: Mood normal.        Behavior: Behavior normal.     LABORATORY DATA:  I have reviewed the labs as listed.  CBC    Component Value Date/Time   WBC 1.2 (LL) 09/01/2021 0834   RBC 2.08 (L) 09/01/2021 0834   HGB 6.5 (LL) 09/01/2021 0834   HGB 11.9 (L) 09/07/2018 1638   HCT 19.9 (L) 09/01/2021 0834   HCT 34.2 (L) 09/07/2018 1638   PLT 212 09/01/2021 0834   PLT 229 09/07/2018 1638   MCV 95.7 09/01/2021 0834   MCV 101 (H) 09/07/2018 1638   MCH 31.3 09/01/2021 0834   MCHC 32.7 09/01/2021 0834   RDW 18.1 (H) 09/01/2021 0834   RDW 12.8 09/07/2018 1638   LYMPHSABS 0.7 09/01/2021 0834   LYMPHSABS 1.3 09/07/2018 1638   MONOABS 0.3 09/01/2021 0834   EOSABS 0.0 09/01/2021 0834   EOSABS 0.2 09/07/2018 1638   BASOSABS 0.0 09/01/2021 0834   BASOSABS 0.1 09/07/2018 1638   CMP Latest Ref Rng & Units 08/14/2021 08/13/2021 08/12/2021  Glucose 70 -  99 mg/dL 175(H) 231(H) 244(H)  BUN 8 - 23 mg/dL 50(H) 51(H) 51(H)  Creatinine 0.61 - 1.24 mg/dL 1.92(H) 2.22(H) 2.37(H)  Sodium 135 - 145 mmol/L 137 134(L) 134(L)   Potassium 3.5 - 5.1 mmol/L 3.4(L) 3.5 3.9  Chloride 98 - 111 mmol/L 102 100 100  CO2 22 - 32 mmol/L 23 21(L) 19(L)  Calcium 8.9 - 10.3 mg/dL 8.5(L) 8.3(L) 9.0  Total Protein 6.5 - 8.1 g/dL 6.1(L) - 7.0  Total Bilirubin 0.3 - 1.2 mg/dL 2.0(H) - 1.6(H)  Alkaline Phos 38 - 126 U/L 90 - 120  AST 15 - 41 U/L 25 - 20  ALT 0 - 44 U/L 27 - 16    DIAGNOSTIC IMAGING:  I have independently reviewed the relevant imaging and discussed with the patient.  ASSESSMENT & PLAN: 1.  Low risk MDS - Bone marrow biopsy on 03/29/2020 shows hypercellular marrow with trilineage dysplasia; 2% blasts; no ring sideroblasts noted.  Chromosome analysis and FISH panel were normal. - Serum erythropoietin was 77.2 (12/14/2019).  Most recent erythropoietin (07/30/2021) is 1,118 - Retacrit 30,000 units monthly started on 04/08/2020 - increased to 40,000 units Q 2 weeks on 07/30/2020 - Last Retacrit was on 07/30/2020 at 40,000 units.   - Downtrending hemoglobin since November 2022.  No improvement with IV iron.  Iron panel negative for iron deficiency.  Normal folate and B12 when checked on 07/30/2020. - Hemoccult stool x3 was negative (08/04/2021) - Admitted to hospital from 08/12/2021 through 08/15/2021 due to severe anemia.  Hgb 4.5 on admission, received 4 units PRBC transfusions.  Work-up during hospital stay showed reticulocytes 0.5, normal haptoglobin, negative DAT.  SPEP negative for M spike.  LDH 213.  Hemoccult stool was negative during hospital stay, therefore no EGD/colonoscopy performed.   - He denies any gross rectal hemorrhage or melena - Symptomatic with fatigue and dyspnea on exertion. - CBC today (09/01/2021): Hgb 6.5, WBC 1.2/ANC 0.2, platelets 212 - PLAN: Proceed with PRBC transfusion today. - Weekly CBC/sample to blood bank +/- transfusions if needed for Hgb < 7.0 - We will DISCONTINUE Retacrit due to ineffectiveness - Proceed with bone marrow biopsy (suspect progression to high-grade MDS versus AML) - RTC after bone  marrow biopsy for shared MD/APP visit to discuss treatment options - Neutropenic precautions due to [place] < 0.5  2.  Iron deficiency - Received Venofer x 5 doses, completed on 01/24/2020 - Received Venofer 400 mg x 1 dose on 05/07/2021 due to downtrending hemoglobin and concern for functional iron deficiency.  (At that time, labs showed ferritin 323, iron saturation 23%, Hgb 10.3) - No significant improvement in hemoglobin after IV Venofer - EGD (10/11/2020): Normal esophagus, stomach, duodenum - Colonoscopy (10/11/2020): Polyp x4, otherwise normal colon.  Per note by Dr. Jenetta Downer (02/13/2021), rectal bleeding was thought to be related to inflammatory polyp in rectum. - He is on Eliquis for atrial fibrillation and has occasional bleeding on tissue with bowel movements.   - He is taking iron tablet every other day. - He denies any recent rectal bleeding or melena.   - Labs (07/30/2021) showed elevated ferritin 632, serum iron 220, and iron saturation 81% - PLAN: No indication for IV iron.  Continue oral iron supplementation every other day.   - We will recheck iron panel in 3 months.   3.  CKD stage IIIb: - History of CKD since 2017. - Renal ultrasound on 04/21/2019 shows bilateral renal cysts. - Baseline creatinine around 1.7-1.9 - PLAN: Continue follow-up with PCP and  nephrologist.     4.  Atrial fibrillation, on Eliquis: - Eliquis was started back sometime in August 2022 - held again during most recent hospitalization (January 2022) due to severe anemia. - He reported occasional scant nosebleeds.  Denies any frank bleeding per rectum or melena, but does have occasional dark bowel movements. - He follows with cardiology - PLAN: Agree with holding Eliquis until further evaluation by cardiology.  5.  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  Amiodarone was discontinued. - Was cleared  by pulmonology and is being followed by PCP - Per PCP note (07/11/2021), DOE is multifactorial related to baseline anemia, A. fib, and his prior lung injury - PLAN: Continue follow-up with PCP   PLAN SUMMARY & DISPOSITION: Weekly CBC + blood bank Possible weekly blood transfusion STOP Retacrit Bone marrow biopsy RTC for shared MD/APP visit after bone marrow biopsy  All questions were answered. The patient knows to call the clinic with any problems, questions or concerns.  Medical decision making: Moderate  Time spent on visit: I spent 20 minutes counseling the patient face to face. The total time spent in the appointment was 30 minutes and more than 50% was on counseling.  The above clinical data and plan of treatment was discussed with Dr. Delton Coombes, supervising physician, who agrees.  Harriett Rush, PA-C  09/01/2021 2:55 PM

## 2021-09-01 NOTE — Progress Notes (Signed)
Patient tolerated transfusion with no complaints voiced.  Side effects with management reviewed with understanding verbalized.  Peripheral IV site clean and dry with no bruising or swelling noted at site.  Good blood return noted before and after administration.  Band aid applied.  Patient left in satisfactory condition with VSS and no s/s of distress noted.   Patient presents today for Retacrit injection. Hemoglobin reviewed prior to administration. VSS. Injection tolerated without incident or complaint. See MAR for details. Patient stable during and after injection.  Patient discharged in satisfactory condition with no s/s of distress noted.

## 2021-09-01 NOTE — Patient Instructions (Signed)
[place] at [facility] Discharge Instructions  You were seen today by Tarri Abernethy PA-C for your MDS.    Your low white blood cells place you at high risk for infections - please see the attached handout for guidelines on avoiding infections.  CALL us IMMEDIATELY IF YOU HAVE ANY FEVER, CHILLS, OR OTHER SIGNS OF INFECTION.  LABS: Return for weekly blood checks and possible transfusions.  OTHER TESTS: Bone marrow biopsy (we will call you to schedule this)  FOLLOW-UP APPOINTMENT: Office visit after bone marrow biopsy   Thank you for choosing [place] at [facility] to provide your oncology and hematology care.  To afford each patient quality time with our provider, please arrive at least 15 minutes before your scheduled appointment time.   If you have a lab appointment with the [facility] please come in thru the Main Entrance and check in at the main information desk.  You need to re-schedule your appointment should you arrive 10 or more minutes late.  We strive to give you quality time with our providers, and arriving late affects you and other patients whose appointments are after yours.  Also, if you no show three or more times for appointments you may be dismissed from the clinic at the providers discretion.     Again, thank you for choosing [facility].  Our hope is that these requests will decrease the amount of time that you wait before being seen by our physicians.       _____________________________________________________________  Should you have questions after your visit to [facility], please contact our office at [phone] and follow the prompts.  Our office hours are 8:00 a.m. and 4:30 p.m. Monday - Friday.  Please note that voicemails left after 4:00 p.m. may not be returned until the following business day.  We are closed weekends and major holidays.  You do have access to a nurse 24-7, just  call the main number to the clinic [phone] and do not press any options, hold on the line and a nurse will answer the phone.    For prescription refill requests, have your pharmacy contact our office and allow 72 hours.    Due to Covid, you will need to wear a mask upon entering the hospital. If you do not have a mask, a mask will be given to you at the Main Entrance upon arrival. For doctor visits, patients may have 1 support person age 44 or older with them. For treatment visits, patients can not have anyone with them due to social distancing guidelines and our immunocompromised population.

## 2021-09-01 NOTE — Patient Instructions (Signed)
[facility]  Discharge Instructions: Thank you for choosing [facility] to provide your oncology and hematology care.  If you have a lab appointment with the Cancer Center, please come in thru the Main Entrance and check in at the main information desk.  Wear comfortable clothing and clothing appropriate for easy access to any Portacath or PICC line.   We strive to give you quality time with your provider. You may need to reschedule your appointment if you arrive late (15 or more minutes).  Arriving late affects you and other patients whose appointments are after yours.  Also, if you miss three or more appointments without notifying the office, you may be dismissed from the clinic at the provider's discretion.      For prescription refill requests, have your pharmacy contact our office and allow 72 hours for refills to be completed.        To help prevent nausea and vomiting after your treatment, we encourage you to take your nausea medication as directed.  BELOW ARE SYMPTOMS THAT SHOULD BE REPORTED IMMEDIATELY: *FEVER GREATER THAN 100.4 F (38 C) OR HIGHER *CHILLS OR SWEATING *NAUSEA AND VOMITING THAT IS NOT CONTROLLED WITH YOUR NAUSEA MEDICATION *UNUSUAL SHORTNESS OF BREATH *UNUSUAL BRUISING OR BLEEDING *URINARY PROBLEMS (pain or burning when urinating, or frequent urination) *BOWEL PROBLEMS (unusual diarrhea, constipation, pain near the anus) TENDERNESS IN MOUTH AND THROAT WITH OR WITHOUT PRESENCE OF ULCERS (sore throat, sores in mouth, or a toothache) UNUSUAL RASH, SWELLING OR PAIN  UNUSUAL VAGINAL DISCHARGE OR ITCHING   Items with * indicate a potential emergency and should be followed up as soon as possible or go to the Emergency Department if any problems should occur.  Please show the CHEMOTHERAPY ALERT CARD or IMMUNOTHERAPY ALERT CARD at check-in to the Emergency Department and triage nurse.  Should you have questions after your visit or need to cancel  or reschedule your appointment, please contact [facility] [phone]  and follow the prompts.  Office hours are 8:00 a.m. to 4:30 p.m. Monday - Friday. Please note that voicemails left after 4:00 p.m. may not be returned until the following business day.  We are closed weekends and major holidays. You have access to a nurse at all times for urgent questions. Please call the main number to the clinic [phone] and follow the prompts.  For any non-urgent questions, you may also contact your provider using MyChart. We now offer e-Visits for anyone 18 and older to request care online for non-urgent symptoms. For details visit [email or web address].   Also download the MyChart app! Go to the app store, search "MyChart", open the app, select , and log in with your MyChart username and password.  Due to Covid, a mask is required upon entering the hospital/clinic. If you do not have a mask, one will be given to you upon arrival. For doctor visits, patients may have 1 support person aged 18 or older with them. For treatment visits, patients cannot have anyone with them due to current Covid guidelines and our immunocompromised population.  

## 2021-09-01 NOTE — Progress Notes (Signed)
CRITICAL VALUE ALERT Critical value received:  Hgb 6.5, ANC 0.2, WBC 1.2 Date of notification:  09-01-21 Time of notification: 0915 Critical value read back:  Yes.   Nurse who received alert:  C. Jashad Depaula RN MD notified time and response:  Dr. Raliegh Ip, give one unit, [phone]

## 2021-09-02 LAB — TYPE AND SCREEN
ABO/RH(D): O POS
Antibody Screen: NEGATIVE
Unit division: 0

## 2021-09-02 LAB — BPAM RBC
Blood Product Expiration Date: 202303082359
ISSUE DATE / TIME: 202302061005
Unit Type and Rh: 5100

## 2021-09-03 ENCOUNTER — Other Ambulatory Visit: Payer: Self-pay | Admitting: Family Medicine

## 2021-09-08 ENCOUNTER — Ambulatory Visit (HOSPITAL_COMMUNITY): Payer: Medicare HMO

## 2021-09-08 ENCOUNTER — Inpatient Hospital Stay (HOSPITAL_COMMUNITY): Payer: Medicare HMO

## 2021-09-08 DIAGNOSIS — M129 Arthropathy, unspecified: Secondary | ICD-10-CM | POA: Diagnosis not present

## 2021-09-08 DIAGNOSIS — R5383 Other fatigue: Secondary | ICD-10-CM | POA: Diagnosis not present

## 2021-09-08 DIAGNOSIS — D509 Iron deficiency anemia, unspecified: Secondary | ICD-10-CM | POA: Diagnosis not present

## 2021-09-08 DIAGNOSIS — D631 Anemia in chronic kidney disease: Secondary | ICD-10-CM | POA: Diagnosis not present

## 2021-09-08 DIAGNOSIS — I129 Hypertensive chronic kidney disease with stage 1 through stage 4 chronic kidney disease, or unspecified chronic kidney disease: Secondary | ICD-10-CM | POA: Diagnosis not present

## 2021-09-08 DIAGNOSIS — R0602 Shortness of breath: Secondary | ICD-10-CM | POA: Diagnosis not present

## 2021-09-08 DIAGNOSIS — D469 Myelodysplastic syndrome, unspecified: Secondary | ICD-10-CM | POA: Diagnosis not present

## 2021-09-08 DIAGNOSIS — D709 Neutropenia, unspecified: Secondary | ICD-10-CM | POA: Diagnosis not present

## 2021-09-08 DIAGNOSIS — G4700 Insomnia, unspecified: Secondary | ICD-10-CM | POA: Diagnosis not present

## 2021-09-08 DIAGNOSIS — N1832 Chronic kidney disease, stage 3b: Secondary | ICD-10-CM | POA: Diagnosis not present

## 2021-09-08 LAB — SAMPLE TO BLOOD BANK

## 2021-09-08 LAB — CBC WITH DIFFERENTIAL/PLATELET
Abs Immature Granulocytes: 0.02 10*3/uL (ref 0.00–0.07)
Basophils Absolute: 0 10*3/uL (ref 0.0–0.1)
Basophils Relative: 0 %
Eosinophils Absolute: 0 10*3/uL (ref 0.0–0.5)
Eosinophils Relative: 1 %
HCT: 20.8 % — ABNORMAL LOW (ref 39.0–52.0)
Hemoglobin: 7.1 g/dL — ABNORMAL LOW (ref 13.0–17.0)
Immature Granulocytes: 2 %
Lymphocytes Relative: 67 %
Lymphs Abs: 0.8 10*3/uL (ref 0.7–4.0)
MCH: 32.1 pg (ref 26.0–34.0)
MCHC: 34.1 g/dL (ref 30.0–36.0)
MCV: 94.1 fL (ref 80.0–100.0)
Monocytes Absolute: 0.2 10*3/uL (ref 0.1–1.0)
Monocytes Relative: 16 %
Neutro Abs: 0.2 10*3/uL — CL (ref 1.7–7.7)
Neutrophils Relative %: 14 %
Platelets: 260 10*3/uL (ref 150–400)
RBC: 2.21 MIL/uL — ABNORMAL LOW (ref 4.22–5.81)
RDW: 16.9 % — ABNORMAL HIGH (ref 11.5–15.5)
WBC Morphology: REACTIVE
WBC: 1.2 10*3/uL — CL (ref 4.0–10.5)
nRBC: 0 % (ref 0.0–0.2)

## 2021-09-08 LAB — PREPARE RBC (CROSSMATCH)

## 2021-09-08 NOTE — Progress Notes (Unsigned)
CRITICAL VALUE ALERT Critical value received:  ANC 0.2, WBC 1.2. Hemoglobin 7.0 Date of notification:  09-08-21 Time of notification: 0910 Critical value read back:  Yes.   Nurse who received alert:  Forest Gleason RN MD notified time and response:  [id number], R. Pennington PA-C

## 2021-09-09 ENCOUNTER — Inpatient Hospital Stay (HOSPITAL_COMMUNITY): Payer: Medicare HMO

## 2021-09-09 ENCOUNTER — Other Ambulatory Visit: Payer: Self-pay

## 2021-09-09 ENCOUNTER — Encounter (HOSPITAL_COMMUNITY): Payer: Self-pay

## 2021-09-09 VITALS — BP 110/55 | HR 71 | Temp 97.2°F | Resp 18

## 2021-09-09 DIAGNOSIS — R5383 Other fatigue: Secondary | ICD-10-CM | POA: Diagnosis not present

## 2021-09-09 DIAGNOSIS — D631 Anemia in chronic kidney disease: Secondary | ICD-10-CM | POA: Diagnosis not present

## 2021-09-09 DIAGNOSIS — N1832 Chronic kidney disease, stage 3b: Secondary | ICD-10-CM | POA: Diagnosis not present

## 2021-09-09 DIAGNOSIS — R0602 Shortness of breath: Secondary | ICD-10-CM | POA: Diagnosis not present

## 2021-09-09 DIAGNOSIS — I129 Hypertensive chronic kidney disease with stage 1 through stage 4 chronic kidney disease, or unspecified chronic kidney disease: Secondary | ICD-10-CM | POA: Diagnosis not present

## 2021-09-09 DIAGNOSIS — G4700 Insomnia, unspecified: Secondary | ICD-10-CM | POA: Diagnosis not present

## 2021-09-09 DIAGNOSIS — D469 Myelodysplastic syndrome, unspecified: Secondary | ICD-10-CM | POA: Diagnosis not present

## 2021-09-09 DIAGNOSIS — D709 Neutropenia, unspecified: Secondary | ICD-10-CM | POA: Diagnosis not present

## 2021-09-09 DIAGNOSIS — M129 Arthropathy, unspecified: Secondary | ICD-10-CM | POA: Diagnosis not present

## 2021-09-09 DIAGNOSIS — D509 Iron deficiency anemia, unspecified: Secondary | ICD-10-CM | POA: Diagnosis not present

## 2021-09-09 MED ORDER — DIPHENHYDRAMINE HCL 25 MG PO CAPS
25.0000 mg | ORAL_CAPSULE | Freq: Once | ORAL | Status: AC
  Administered 2021-09-09: 25 mg via ORAL
  Filled 2021-09-09: qty 1

## 2021-09-09 MED ORDER — ACETAMINOPHEN 325 MG PO TABS
650.0000 mg | ORAL_TABLET | Freq: Once | ORAL | Status: AC
  Administered 2021-09-09: 650 mg via ORAL
  Filled 2021-09-09: qty 2

## 2021-09-09 MED ORDER — SODIUM CHLORIDE 0.9% IV SOLUTION
250.0000 mL | Freq: Once | INTRAVENOUS | Status: AC
  Administered 2021-09-09: 250 mL via INTRAVENOUS

## 2021-09-09 NOTE — Patient Instructions (Signed)
[facility]  Discharge Instructions: ?Thank you for choosing [facility] to provide your oncology and hematology care.  ?If you have a lab appointment with the Cancer Center, please come in thru the Main Entrance and check in at the main information desk. ? ?Wear comfortable clothing and clothing appropriate for easy access to any Portacath or PICC line.  ? ?We strive to give you quality time with your provider. You may need to reschedule your appointment if you arrive late (15 or more minutes).  Arriving late affects you and other patients whose appointments are after yours.  Also, if you miss three or more appointments without notifying the office, you may be dismissed from the clinic at the provider?s discretion.    ?  ?For prescription refill requests, have your pharmacy contact our office and allow 72 hours for refills to be completed.   ? ?Today you received the following 1 unit of PRBCs, return as scheduled. ?  ?To help prevent nausea and vomiting after your treatment, we encourage you to take your nausea medication as directed. ? ?BELOW ARE SYMPTOMS THAT SHOULD BE REPORTED IMMEDIATELY: ?*FEVER GREATER THAN 100.4 F (38 ?C) OR HIGHER ?*CHILLS OR SWEATING ?*NAUSEA AND VOMITING THAT IS NOT CONTROLLED WITH YOUR NAUSEA MEDICATION ?*UNUSUAL SHORTNESS OF BREATH ?*UNUSUAL BRUISING OR BLEEDING ?*URINARY PROBLEMS (pain or burning when urinating, or frequent urination) ?*BOWEL PROBLEMS (unusual diarrhea, constipation, pain near the anus) ?TENDERNESS IN MOUTH AND THROAT WITH OR WITHOUT PRESENCE OF ULCERS (sore throat, sores in mouth, or a toothache) ?UNUSUAL RASH, SWELLING OR PAIN  ?UNUSUAL VAGINAL DISCHARGE OR ITCHING  ? ?Items with * indicate a potential emergency and should be followed up as soon as possible or go to the Emergency Department if any problems should occur. ? ?Please show the CHEMOTHERAPY ALERT CARD or IMMUNOTHERAPY ALERT CARD at check-in to the Emergency Department and triage  nurse. ? ?Should you have questions after your visit or need to cancel or reschedule your appointment, please contact [facility] [phone]  and follow the prompts.  Office hours are 8:00 a.m. to 4:30 p.m. Monday - Friday. Please note that voicemails left after 4:00 p.m. may not be returned until the following business day.  We are closed weekends and major holidays. You have access to a nurse at all times for urgent questions. Please call the main number to the clinic [phone] and follow the prompts. ? ?For any non-urgent questions, you may also contact your provider using MyChart. We now offer e-Visits for anyone 18 and older to request care online for non-urgent symptoms. For details visit [email or web address]. ?  ?Also download the MyChart app! Go to the app store, search "MyChart", open the app, select [facility], and log in with your MyChart username and password. ? ?Due to Covid, a mask is required upon entering the hospital/clinic. If you do not have a mask, one will be given to you upon arrival. For doctor visits, patients may have 1 support person aged 18 or older with them. For treatment visits, patients cannot have anyone with them due to current Covid guidelines and our immunocompromised population.  ?

## 2021-09-09 NOTE — Progress Notes (Signed)
Patient presents today for 1 PRBCs, pre-medications given. Patient tolerated blood transfusion with no complaints voiced. Peripheral IV site clean and dry with good blood return noted before and after infusion. Band aid applied. VSS with discharge and left in satisfactory condition with no s/s of distress noted.

## 2021-09-10 LAB — TYPE AND SCREEN
ABO/RH(D): O POS
Antibody Screen: NEGATIVE
Unit division: 0

## 2021-09-10 LAB — BPAM RBC
Blood Product Expiration Date: 202303132359
ISSUE DATE / TIME: 202302141004
Unit Type and Rh: 5100

## 2021-09-11 ENCOUNTER — Inpatient Hospital Stay: Payer: Medicare HMO

## 2021-09-11 ENCOUNTER — Ambulatory Visit (HOSPITAL_COMMUNITY): Payer: Medicare HMO

## 2021-09-11 ENCOUNTER — Other Ambulatory Visit: Payer: Self-pay

## 2021-09-11 ENCOUNTER — Inpatient Hospital Stay: Payer: Medicare HMO | Attending: Hematology | Admitting: Adult Health

## 2021-09-11 ENCOUNTER — Ambulatory Visit (HOSPITAL_COMMUNITY): Payer: Medicare HMO | Admitting: Physician Assistant

## 2021-09-11 ENCOUNTER — Other Ambulatory Visit (HOSPITAL_COMMUNITY): Payer: Medicare HMO

## 2021-09-11 VITALS — BP 126/70 | HR 65 | Temp 98.1°F | Resp 18

## 2021-09-11 DIAGNOSIS — D649 Anemia, unspecified: Secondary | ICD-10-CM | POA: Diagnosis not present

## 2021-09-11 DIAGNOSIS — D469 Myelodysplastic syndrome, unspecified: Secondary | ICD-10-CM

## 2021-09-11 DIAGNOSIS — D72819 Decreased white blood cell count, unspecified: Secondary | ICD-10-CM | POA: Diagnosis not present

## 2021-09-11 DIAGNOSIS — Z79899 Other long term (current) drug therapy: Secondary | ICD-10-CM | POA: Diagnosis not present

## 2021-09-11 DIAGNOSIS — C9200 Acute myeloblastic leukemia, not having achieved remission: Secondary | ICD-10-CM | POA: Diagnosis not present

## 2021-09-11 LAB — CBC WITH DIFFERENTIAL (CANCER CENTER ONLY)
Abs Immature Granulocytes: 0.01 10*3/uL (ref 0.00–0.07)
Basophils Absolute: 0 10*3/uL (ref 0.0–0.1)
Basophils Relative: 0 %
Eosinophils Absolute: 0 10*3/uL (ref 0.0–0.5)
Eosinophils Relative: 0 %
HCT: 21.3 % — ABNORMAL LOW (ref 39.0–52.0)
Hemoglobin: 7.5 g/dL — ABNORMAL LOW (ref 13.0–17.0)
Immature Granulocytes: 1 %
Lymphocytes Relative: 60 %
Lymphs Abs: 0.7 10*3/uL (ref 0.7–4.0)
MCH: 30.9 pg (ref 26.0–34.0)
MCHC: 35.2 g/dL (ref 30.0–36.0)
MCV: 87.7 fL (ref 80.0–100.0)
Monocytes Absolute: 0.3 10*3/uL (ref 0.1–1.0)
Monocytes Relative: 24 %
Neutro Abs: 0.2 10*3/uL — CL (ref 1.7–7.7)
Neutrophils Relative %: 15 %
Platelet Count: 241 10*3/uL (ref 150–400)
RBC: 2.43 MIL/uL — ABNORMAL LOW (ref 4.22–5.81)
RDW: 15.7 % — ABNORMAL HIGH (ref 11.5–15.5)
Smear Review: NORMAL
WBC Count: 1.2 10*3/uL — ABNORMAL LOW (ref 4.0–10.5)
nRBC: 0 % (ref 0.0–0.2)

## 2021-09-11 MED ORDER — LIDOCAINE HCL 2 % IJ SOLN
INTRAMUSCULAR | Status: AC
  Filled 2021-09-11: qty 20

## 2021-09-11 NOTE — Patient Instructions (Signed)
Bone Marrow Aspiration and Bone Marrow Biopsy, Adult, Care After °This sheet gives you information about how to care for yourself after your procedure. Your health care provider may also give you more specific instructions. If you have problems or questions, contact your health care provider. °What can I expect after the procedure? °After the procedure, it is common to have: °Mild pain and tenderness. °Swelling. °Bruising. °Follow these instructions at home: °Puncture site care ° °Follow instructions from your health care provider about how to take care of the puncture site. Make sure you: °Wash your hands with soap and water before and after you change your bandage (dressing). If soap and water are not available, use hand sanitizer. °Change your dressing as told by your health care provider. °Check your puncture site every day for signs of infection. Check for: °More redness, swelling, or pain. °Fluid or blood. °Warmth. °Pus or a bad smell. °Activity °Return to your normal activities as told by your health care provider. Ask your health care provider what activities are safe for you. °Do not lift anything that is heavier than 10 lb (4.5 kg), or the limit that you are told, until your health care provider says that it is safe. °Do not drive for 24 hours if you were given a sedative during your procedure. °General instructions ° °Take over-the-counter and prescription medicines only as told by your health care provider. °Do not take baths, swim, or use a hot tub until your health care provider approves. Ask your health care provider if you may take showers. You may only be allowed to take sponge baths. °If directed, put ice on the affected area. To do this: °Put ice in a plastic bag. °Place a towel between your skin and the bag. °Leave the ice on for 20 minutes, 2-3 times a day. °Keep all follow-up visits as told by your health care provider. This is important. °Contact a health care provider if: °Your pain is not  controlled with medicine. °You have a fever. °You have more redness, swelling, or pain around the puncture site. °You have fluid or blood coming from the puncture site. °Your puncture site feels warm to the touch. °You have pus or a bad smell coming from the puncture site. °Summary °After the procedure, it is common to have mild pain, tenderness, swelling, and bruising. °Follow instructions from your health care provider about how to take care of the puncture site and what activities are safe for you. °Take over-the-counter and prescription medicines only as told by your health care provider. °Contact a health care provider if you have any signs of infection, such as fluid or blood coming from the puncture site. °This information is not intended to replace advice given to you by your health care provider. Make sure you discuss any questions you have with your health care provider. °Document Revised: 11/29/2018 Document Reviewed: 11/29/2018 °Elsevier Patient Education © 2022 [employer] ° °

## 2021-09-11 NOTE — Progress Notes (Signed)
CRITICAL VALUE STICKER  CRITICAL VALUE: ANC 0.2  RECEIVER (on-site recipient of call): Mickel Baas, [place] NOTIFIED: 09/11/21 0940  MESSENGER (representative from lab): Verdis Frederickson  MD NOTIFIED: Tarri Abernethy, PA  TIME OF NOTIFICATION:09/11/21 [phone]  RESPONSE: Pt has already been educated on neutropenic precautions.

## 2021-09-11 NOTE — Progress Notes (Signed)
INDICATION:MDS  Brief examination was performed. ENT: adequate airway clearance Heart: regular rate and rhythm.No Murmurs Lungs: clear to auscultation, no wheezes, normal respiratory effort  Bone Marrow Biopsy and Aspiration Procedure Note   Informed consent was obtained and potential risks including bleeding, infection and pain were reviewed with the patient.  The patient's name, date of birth, identification, consent and allergies were verified prior to the start of procedure and time out was performed.  The right posterior iliac crest was chosen as the site of biopsy.  The skin was prepped with ChloraPrep.   8 cc of 2% lidocaine was used to provide local anaesthesia.   10 cc of bone marrow aspirate was obtained followed by 1cm biopsy.  Pressure was applied to the biopsy site and bandage was placed over the biopsy site. Patient was made to lie on the back for 30 mins prior to discharge.  The procedure was tolerated well. COMPLICATIONS: None BLOOD LOSS: none The patient was discharged home in stable condition with a follow-up with [facility] and providers to review results.  Patient was provided with post bone marrow biopsy instructions and instructed to call if there was any bleeding or worsening pain.  Specimens sent for flow cytometry, cytogenetics and additional studies.  Signed Scot Dock, NP

## 2021-09-11 NOTE — Progress Notes (Signed)
Pt observed for 30 minutes post Bone Marrow Biopsy. VSS. Biopsy site clean and dry. Pt had no complaints or concerns at time of discharge.

## 2021-09-15 ENCOUNTER — Inpatient Hospital Stay (HOSPITAL_COMMUNITY): Payer: Medicare HMO

## 2021-09-15 ENCOUNTER — Ambulatory Visit (HOSPITAL_COMMUNITY): Payer: Medicare HMO

## 2021-09-15 ENCOUNTER — Ambulatory Visit (HOSPITAL_COMMUNITY): Payer: Medicare HMO | Admitting: Physician Assistant

## 2021-09-15 DIAGNOSIS — R0602 Shortness of breath: Secondary | ICD-10-CM | POA: Diagnosis not present

## 2021-09-15 DIAGNOSIS — N1832 Chronic kidney disease, stage 3b: Secondary | ICD-10-CM | POA: Diagnosis not present

## 2021-09-15 DIAGNOSIS — D631 Anemia in chronic kidney disease: Secondary | ICD-10-CM | POA: Diagnosis not present

## 2021-09-15 DIAGNOSIS — D469 Myelodysplastic syndrome, unspecified: Secondary | ICD-10-CM

## 2021-09-15 DIAGNOSIS — I129 Hypertensive chronic kidney disease with stage 1 through stage 4 chronic kidney disease, or unspecified chronic kidney disease: Secondary | ICD-10-CM | POA: Diagnosis not present

## 2021-09-15 DIAGNOSIS — M129 Arthropathy, unspecified: Secondary | ICD-10-CM | POA: Diagnosis not present

## 2021-09-15 DIAGNOSIS — D709 Neutropenia, unspecified: Secondary | ICD-10-CM | POA: Diagnosis not present

## 2021-09-15 DIAGNOSIS — G4700 Insomnia, unspecified: Secondary | ICD-10-CM | POA: Diagnosis not present

## 2021-09-15 DIAGNOSIS — D509 Iron deficiency anemia, unspecified: Secondary | ICD-10-CM | POA: Diagnosis not present

## 2021-09-15 DIAGNOSIS — R5383 Other fatigue: Secondary | ICD-10-CM | POA: Diagnosis not present

## 2021-09-15 LAB — CBC WITH DIFFERENTIAL/PLATELET
Abs Immature Granulocytes: 0.04 10*3/uL (ref 0.00–0.07)
Basophils Absolute: 0 10*3/uL (ref 0.0–0.1)
Basophils Relative: 0 %
Eosinophils Absolute: 0 10*3/uL (ref 0.0–0.5)
Eosinophils Relative: 0 %
HCT: 21.8 % — ABNORMAL LOW (ref 39.0–52.0)
Hemoglobin: 7.4 g/dL — ABNORMAL LOW (ref 13.0–17.0)
Immature Granulocytes: 3 %
Lymphocytes Relative: 61 %
Lymphs Abs: 0.7 10*3/uL (ref 0.7–4.0)
MCH: 31.1 pg (ref 26.0–34.0)
MCHC: 33.9 g/dL (ref 30.0–36.0)
MCV: 91.6 fL (ref 80.0–100.0)
Monocytes Absolute: 0.2 10*3/uL (ref 0.1–1.0)
Monocytes Relative: 18 %
Neutro Abs: 0.2 10*3/uL — CL (ref 1.7–7.7)
Neutrophils Relative %: 18 %
Platelets: 247 10*3/uL (ref 150–400)
RBC: 2.38 MIL/uL — ABNORMAL LOW (ref 4.22–5.81)
RDW: 15.3 % (ref 11.5–15.5)
WBC: 1.2 10*3/uL — CL (ref 4.0–10.5)
nRBC: 0 % (ref 0.0–0.2)

## 2021-09-15 LAB — SURGICAL PATHOLOGY

## 2021-09-15 LAB — SAMPLE TO BLOOD BANK

## 2021-09-15 NOTE — Progress Notes (Unsigned)
CRITICAL VALUE ALERT Critical value received:  WBC 1.2, ANC 0.2,  Date of notification:  09-15-21 Time of notification: 7837 Critical value read back:  Yes.   Nurse who received alert:  C. Kiyan Burmester RN MD notified time and response:  5423, no new orders at this time. Will revaluate this later this week.

## 2021-09-17 ENCOUNTER — Encounter (INDEPENDENT_AMBULATORY_CARE_PROVIDER_SITE_OTHER): Payer: Self-pay | Admitting: *Deleted

## 2021-09-18 ENCOUNTER — Inpatient Hospital Stay (HOSPITAL_COMMUNITY): Payer: Medicare HMO

## 2021-09-18 ENCOUNTER — Other Ambulatory Visit (HOSPITAL_COMMUNITY): Payer: Self-pay | Admitting: Physician Assistant

## 2021-09-18 ENCOUNTER — Other Ambulatory Visit: Payer: Self-pay | Admitting: Family Medicine

## 2021-09-18 VITALS — BP 125/49 | HR 53 | Temp 98.6°F | Resp 16

## 2021-09-18 DIAGNOSIS — F320 Major depressive disorder, single episode, mild: Secondary | ICD-10-CM

## 2021-09-18 DIAGNOSIS — C9200 Acute myeloblastic leukemia, not having achieved remission: Secondary | ICD-10-CM

## 2021-09-18 DIAGNOSIS — M129 Arthropathy, unspecified: Secondary | ICD-10-CM | POA: Diagnosis not present

## 2021-09-18 DIAGNOSIS — R5383 Other fatigue: Secondary | ICD-10-CM | POA: Diagnosis not present

## 2021-09-18 DIAGNOSIS — D638 Anemia in other chronic diseases classified elsewhere: Secondary | ICD-10-CM

## 2021-09-18 DIAGNOSIS — D469 Myelodysplastic syndrome, unspecified: Secondary | ICD-10-CM

## 2021-09-18 DIAGNOSIS — R0602 Shortness of breath: Secondary | ICD-10-CM | POA: Diagnosis not present

## 2021-09-18 DIAGNOSIS — D709 Neutropenia, unspecified: Secondary | ICD-10-CM | POA: Diagnosis not present

## 2021-09-18 DIAGNOSIS — D509 Iron deficiency anemia, unspecified: Secondary | ICD-10-CM | POA: Diagnosis not present

## 2021-09-18 DIAGNOSIS — N1832 Chronic kidney disease, stage 3b: Secondary | ICD-10-CM | POA: Diagnosis not present

## 2021-09-18 DIAGNOSIS — G4700 Insomnia, unspecified: Secondary | ICD-10-CM | POA: Diagnosis not present

## 2021-09-18 DIAGNOSIS — I129 Hypertensive chronic kidney disease with stage 1 through stage 4 chronic kidney disease, or unspecified chronic kidney disease: Secondary | ICD-10-CM | POA: Diagnosis not present

## 2021-09-18 DIAGNOSIS — D631 Anemia in chronic kidney disease: Secondary | ICD-10-CM | POA: Diagnosis not present

## 2021-09-18 LAB — SAMPLE TO BLOOD BANK

## 2021-09-18 LAB — CBC WITH DIFFERENTIAL/PLATELET
Abs Immature Granulocytes: 0.04 10*3/uL (ref 0.00–0.07)
Basophils Absolute: 0 10*3/uL (ref 0.0–0.1)
Basophils Relative: 0 %
Eosinophils Absolute: 0 10*3/uL (ref 0.0–0.5)
Eosinophils Relative: 0 %
HCT: 19.7 % — ABNORMAL LOW (ref 39.0–52.0)
Hemoglobin: 6.8 g/dL — CL (ref 13.0–17.0)
Immature Granulocytes: 4 %
Lymphocytes Relative: 55 %
Lymphs Abs: 0.6 10*3/uL — ABNORMAL LOW (ref 0.7–4.0)
MCH: 31.2 pg (ref 26.0–34.0)
MCHC: 34.5 g/dL (ref 30.0–36.0)
MCV: 90.4 fL (ref 80.0–100.0)
Monocytes Absolute: 0.2 10*3/uL (ref 0.1–1.0)
Monocytes Relative: 18 %
Neutro Abs: 0.3 10*3/uL — CL (ref 1.7–7.7)
Neutrophils Relative %: 23 %
Platelets: 242 10*3/uL (ref 150–400)
RBC: 2.18 MIL/uL — ABNORMAL LOW (ref 4.22–5.81)
RDW: 15 % (ref 11.5–15.5)
WBC: 1.1 10*3/uL — CL (ref 4.0–10.5)
nRBC: 0 % (ref 0.0–0.2)

## 2021-09-18 LAB — PREPARE RBC (CROSSMATCH)

## 2021-09-18 MED ORDER — SODIUM CHLORIDE 0.9% IV SOLUTION: 250.0000 mL | Freq: Once | INTRAVENOUS | Status: AC

## 2021-09-18 MED ORDER — ACETAMINOPHEN 325 MG PO TABS
650.0000 mg | ORAL_TABLET | Freq: Once | ORAL | Status: AC
  Administered 2021-09-18: 650 mg via ORAL
  Filled 2021-09-18: qty 2

## 2021-09-18 MED ORDER — DIPHENHYDRAMINE HCL 25 MG PO CAPS
25.0000 mg | ORAL_CAPSULE | Freq: Once | ORAL | Status: AC
  Administered 2021-09-18: 25 mg via ORAL
  Filled 2021-09-18: qty 1

## 2021-09-18 NOTE — Progress Notes (Signed)
CRITICAL VALUE ALERT Critical value received:  Hgb 6.6, WBC 1.1 Date of notification:  09-18-21 Time of notification: 0937 Critical value read back:  Yes.   Nurse who received alert:  C. Masaye Gatchalian RN MD notified time and response:  Tarri Abernethy PA-C will give one unit of blood today.   One unit of blood given per orders. Patient tolerated it well without problems. Vitals stable and discharged home from clinic via wheelchair. Follow up as scheduled.

## 2021-09-18 NOTE — Progress Notes (Signed)
BRIEF PROGRESS NOTE: I spoke in person with the patient and his companion, Ivan Anchors, after his blood transfusion.  They requested to review results of his recent bone marrow biopsy.  Patient was informed that bone marrow biopsy shows AML.  We discussed that without treatment, his prognosis is very poor.  We recommended referral to [facility] for further testing and for discussion of various treatment options.  We did also discuss that if he chose not to pursue treatment, he would be a candidate for hospice.  Patient is hesitant to start treatment at this time, but he agrees to consultation at [facility] to discuss further with them.  For the time being, patient will continue weekly CBC with as needed blood transfusions at our clinic. We will add IntelliGEN Myeloid Panel to his next lab draw for NGS sequencing. He is scheduled for shared visit with myself and Dr. Delton Coombes on 09/29/2021.  Harriett Rush  09/18/21 4:18 PM

## 2021-09-19 ENCOUNTER — Encounter (HOSPITAL_COMMUNITY): Payer: Self-pay | Admitting: Lab

## 2021-09-19 ENCOUNTER — Encounter (HOSPITAL_COMMUNITY): Payer: Self-pay | Admitting: Hematology

## 2021-09-19 LAB — TYPE AND SCREEN
ABO/RH(D): O POS
Antibody Screen: NEGATIVE
Unit division: 0

## 2021-09-19 LAB — BPAM RBC
Blood Product Expiration Date: 202303242359
ISSUE DATE / TIME: 202302231100
Unit Type and Rh: 5100

## 2021-09-19 NOTE — Progress Notes (Unsigned)
Referral sent to Dr Florene Glen at [facility].  Apt 3/3 @215 .  Pt aware of apt

## 2021-09-22 ENCOUNTER — Other Ambulatory Visit: Payer: Self-pay

## 2021-09-22 ENCOUNTER — Other Ambulatory Visit (HOSPITAL_COMMUNITY): Payer: Self-pay | Admitting: *Deleted

## 2021-09-22 ENCOUNTER — Telehealth (HOSPITAL_COMMUNITY): Payer: Self-pay | Admitting: Physician Assistant

## 2021-09-22 ENCOUNTER — Inpatient Hospital Stay (HOSPITAL_COMMUNITY): Payer: Medicare HMO

## 2021-09-22 ENCOUNTER — Encounter (HOSPITAL_COMMUNITY): Payer: Self-pay | Admitting: *Deleted

## 2021-09-22 ENCOUNTER — Ambulatory Visit (HOSPITAL_COMMUNITY): Payer: Medicare HMO

## 2021-09-22 ENCOUNTER — Other Ambulatory Visit (HOSPITAL_COMMUNITY): Payer: Self-pay | Admitting: Physician Assistant

## 2021-09-22 ENCOUNTER — Encounter (HOSPITAL_COMMUNITY): Payer: Self-pay | Admitting: Hematology

## 2021-09-22 DIAGNOSIS — R109 Unspecified abdominal pain: Secondary | ICD-10-CM

## 2021-09-22 DIAGNOSIS — N183 Chronic kidney disease, stage 3 unspecified: Secondary | ICD-10-CM

## 2021-09-22 DIAGNOSIS — I129 Hypertensive chronic kidney disease with stage 1 through stage 4 chronic kidney disease, or unspecified chronic kidney disease: Secondary | ICD-10-CM | POA: Diagnosis not present

## 2021-09-22 DIAGNOSIS — D631 Anemia in chronic kidney disease: Secondary | ICD-10-CM | POA: Diagnosis not present

## 2021-09-22 DIAGNOSIS — D469 Myelodysplastic syndrome, unspecified: Secondary | ICD-10-CM | POA: Diagnosis not present

## 2021-09-22 DIAGNOSIS — G4700 Insomnia, unspecified: Secondary | ICD-10-CM | POA: Diagnosis not present

## 2021-09-22 DIAGNOSIS — R0602 Shortness of breath: Secondary | ICD-10-CM | POA: Diagnosis not present

## 2021-09-22 DIAGNOSIS — C9200 Acute myeloblastic leukemia, not having achieved remission: Secondary | ICD-10-CM | POA: Diagnosis not present

## 2021-09-22 DIAGNOSIS — D709 Neutropenia, unspecified: Secondary | ICD-10-CM | POA: Diagnosis not present

## 2021-09-22 DIAGNOSIS — N1832 Chronic kidney disease, stage 3b: Secondary | ICD-10-CM | POA: Diagnosis not present

## 2021-09-22 DIAGNOSIS — D509 Iron deficiency anemia, unspecified: Secondary | ICD-10-CM | POA: Diagnosis not present

## 2021-09-22 DIAGNOSIS — M129 Arthropathy, unspecified: Secondary | ICD-10-CM | POA: Diagnosis not present

## 2021-09-22 DIAGNOSIS — R5383 Other fatigue: Secondary | ICD-10-CM | POA: Diagnosis not present

## 2021-09-22 LAB — CBC WITH DIFFERENTIAL/PLATELET
Abs Immature Granulocytes: 0.07 10*3/uL (ref 0.00–0.07)
Basophils Absolute: 0 10*3/uL (ref 0.0–0.1)
Basophils Relative: 0 %
Eosinophils Absolute: 0 10*3/uL (ref 0.0–0.5)
Eosinophils Relative: 0 %
HCT: 24.6 % — ABNORMAL LOW (ref 39.0–52.0)
Hemoglobin: 8.2 g/dL — ABNORMAL LOW (ref 13.0–17.0)
Immature Granulocytes: 5 %
Lymphocytes Relative: 47 %
Lymphs Abs: 0.7 10*3/uL (ref 0.7–4.0)
MCH: 29.1 pg (ref 26.0–34.0)
MCHC: 33.3 g/dL (ref 30.0–36.0)
MCV: 87.2 fL (ref 80.0–100.0)
Monocytes Absolute: 0.3 10*3/uL (ref 0.1–1.0)
Monocytes Relative: 24 %
Neutro Abs: 0.4 10*3/uL — CL (ref 1.7–7.7)
Neutrophils Relative %: 24 %
Platelets: 252 10*3/uL (ref 150–400)
RBC: 2.82 MIL/uL — ABNORMAL LOW (ref 4.22–5.81)
RDW: 14.5 % (ref 11.5–15.5)
WBC: 1.4 10*3/uL — CL (ref 4.0–10.5)
nRBC: 0 % (ref 0.0–0.2)

## 2021-09-22 LAB — COMPREHENSIVE METABOLIC PANEL
ALT: 19 U/L (ref 0–44)
AST: 18 U/L (ref 15–41)
Albumin: 4.1 g/dL (ref 3.5–5.0)
Alkaline Phosphatase: 129 U/L — ABNORMAL HIGH (ref 38–126)
Anion gap: 11 (ref 5–15)
BUN: 36 mg/dL — ABNORMAL HIGH (ref 8–23)
CO2: 25 mmol/L (ref 22–32)
Calcium: 8.9 mg/dL (ref 8.9–10.3)
Chloride: 98 mmol/L (ref 98–111)
Creatinine, Ser: 1.74 mg/dL — ABNORMAL HIGH (ref 0.61–1.24)
GFR, Estimated: 40 mL/min — ABNORMAL LOW (ref >60)
Glucose, Bld: 269 mg/dL — ABNORMAL HIGH (ref 70–99)
Potassium: 3.8 mmol/L (ref 3.5–5.1)
Sodium: 134 mmol/L — ABNORMAL LOW (ref 135–145)
Total Bilirubin: 1.1 mg/dL (ref 0.3–1.2)
Total Protein: 7.3 g/dL (ref 6.5–8.1)

## 2021-09-22 LAB — SAMPLE TO BLOOD BANK

## 2021-09-22 NOTE — Telephone Encounter (Signed)
I spoke with the patient's designated representative, Ivan Anchors, earlier this afternoon regarding the patient's abdominal pain.  I had placed orders for patient to have stat CT abdomen/pelvis due to RN reports that patient complained of abdominal pain during his lab draw today, but the patient called radiology to cancel this test.  Upon further discussion with Ms. Rosana Hoes, she reports that the patient has had abdominal pain for the past week described as diffuse and dull.  She did not think that he had much abdominal tenderness when she pressed on his abdomen.  She states that he did not have any fever or diarrhea.  He has been having some chills.  I explained to the patient's representative that we felt it was extremely important to check CT abdomen/pelvis due to risk of neutropenic enterocolitis/typhlitis.  I explained that untreated neutropenic enterocolitis or typhlitis could result in severe illness, acute abdomen, septic shock, and death.  Ms. Rosana Hoes verbalized understanding and stated that she would discuss further with the patient.  She called back to the clinic later to report that the patient continued to refuse CT abdomen/pelvis.  Both the patient and Ms. Rosana Hoes were encouraged to call our clinic if he had any worsening symptoms or changed his mind about CT scan.  They were encouraged to seek emergency medical attention if he has any severe abdominal pain or fevers.  Harriett Rush  09/22/21 6:58 PM

## 2021-09-22 NOTE — Progress Notes (Signed)
Wife called to advise that patient is experiencing headaches, frequent cough, that is not a new finding.  Having difficulty swallowing, abdominal pain and loss of appetite.  Advised to go to the er, which they refused.  Has lab appointment and will have done at scheduled time.  Will review labs and consult with Dr. Delton Coombes regarding further plans.

## 2021-09-22 NOTE — Progress Notes (Signed)
Patient's labs have improved.  States he has been drinking adequate fluids .  Offered symptom management appointment for today and he declined.  States that he has appointment at [facility] on Friday and will contact us if he feels he needs to be seen prior to that appointment.  Tarri Abernethy- PAC aware.

## 2021-09-22 NOTE — Progress Notes (Signed)
Patient complained of abdominal pain during lab draw today.  We have ordered stat CT abdomen/pelvis to rule out neutropenic enterocolitis/typhlitis in the setting of his AML.  Noncontrast due to kidney function.

## 2021-09-26 DIAGNOSIS — C9200 Acute myeloblastic leukemia, not having achieved remission: Secondary | ICD-10-CM | POA: Diagnosis not present

## 2021-09-26 DIAGNOSIS — R5383 Other fatigue: Secondary | ICD-10-CM | POA: Diagnosis not present

## 2021-09-26 DIAGNOSIS — R0600 Dyspnea, unspecified: Secondary | ICD-10-CM | POA: Diagnosis not present

## 2021-09-26 DIAGNOSIS — D709 Neutropenia, unspecified: Secondary | ICD-10-CM | POA: Diagnosis not present

## 2021-09-26 DIAGNOSIS — I10 Essential (primary) hypertension: Secondary | ICD-10-CM | POA: Diagnosis not present

## 2021-09-26 DIAGNOSIS — Z87891 Personal history of nicotine dependence: Secondary | ICD-10-CM | POA: Diagnosis not present

## 2021-09-26 DIAGNOSIS — R791 Abnormal coagulation profile: Secondary | ICD-10-CM | POA: Diagnosis not present

## 2021-09-26 DIAGNOSIS — E119 Type 2 diabetes mellitus without complications: Secondary | ICD-10-CM | POA: Diagnosis not present

## 2021-09-28 NOTE — Progress Notes (Deleted)
RESCHEDULE 

## 2021-09-29 ENCOUNTER — Telehealth (HOSPITAL_COMMUNITY): Payer: Self-pay | Admitting: *Deleted

## 2021-09-29 ENCOUNTER — Telehealth (HOSPITAL_COMMUNITY): Payer: Self-pay | Admitting: Physician Assistant

## 2021-09-29 ENCOUNTER — Inpatient Hospital Stay (HOSPITAL_COMMUNITY): Payer: Medicare HMO

## 2021-09-29 ENCOUNTER — Ambulatory Visit (HOSPITAL_COMMUNITY): Payer: Medicare HMO | Admitting: Hematology

## 2021-09-29 ENCOUNTER — Encounter (HOSPITAL_COMMUNITY): Payer: Self-pay

## 2021-09-29 NOTE — Telephone Encounter (Signed)
I discussed at length with the patient's companion and designated contact person Ivan Anchors) regarding the patient's condition and plan of care. ? ?Patient visited leukemia clinic at [facility] with Dr. Florene Glen this past Friday on 09/26/2021.  Treatment options were presented in detail to the patient by Dr. Florene Glen, but patient ultimately declined any ongoing treatment. ? ?I discussed with Ms. Rosana Hoes that since the patient wished to be at home and as comfortable as possible without pursuing any further treatment, establishing with hospice care would be most in line with and supportive of his goals.  She verbalized agreement and understanding of this, but was not sure if the patient would agree to having hospice services at this time.  We did discuss that his condition will continue to deteriorate and that he will feel worse rather than better as things progress.  We discussed that the patient has extremely poor prognosis with very limited life expectancy at this point. ? ?The patient had been scheduled for office visit today, but canceled due to not feeling well. ?He has been rescheduled for later this week. ?If he chooses to establish with hospice prior to that appointment, there is no need for him to see [place] in clinic. ?However, if he wishes to come to his appointment on Wednesday, we can check labs and see if he would benefit from blood transfusion and can discuss referral to hospice services in person at that time. ? ?Ms. Davis verbalizes understanding of the above.  Her questions have been answered at this time. ? ?Harriett Rush, PA-C ?09/29/2021 2:31 PM ?

## 2021-09-29 NOTE — Telephone Encounter (Signed)
Patient's significant other, Lelon Frohlich called to advise that patient was seen at [facility] on Friday and declined any offered treatments, to include blood transfusion.  He has appointments today, however, does not wish to get out of the house.  Advised that as long as he waits, the lower his hemoglobin will fall and will put him at high risk for adverse events.  Verbalized understanding.  Offered discussion regarding palliative care and she states he is not interested in addressing that yet.  Tarri Abernethy - PAC will speak to her this afternoon to discuss options. ?

## 2021-09-30 ENCOUNTER — Other Ambulatory Visit: Payer: Self-pay

## 2021-09-30 DIAGNOSIS — I129 Hypertensive chronic kidney disease with stage 1 through stage 4 chronic kidney disease, or unspecified chronic kidney disease: Secondary | ICD-10-CM

## 2021-09-30 DIAGNOSIS — E1122 Type 2 diabetes mellitus with diabetic chronic kidney disease: Secondary | ICD-10-CM

## 2021-09-30 MED ORDER — EMPAGLIFLOZIN 10 MG PO TABS: 10.0000 mg | ORAL_TABLET | Freq: Every day | ORAL | 0 refills | Status: AC

## 2021-09-30 NOTE — Progress Notes (Addendum)
[facility] 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 Sansom 76 y.o. 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  He was last seen by Rojelio Brenner PA-C on 09/01/2021.  He underwent bone marrow biopsy on 09/11/2021, which showed findings consistent with AML.  This was discussed with patient at informal office visit on 09/18/2021, and patient was referred to see Dr. Lowell Guitar at [facility] for consultation on 09/26/2021 regarding treatment options for his leukemia.  Per available records from [facility]), additional work-up and treatment options were discussed with patient by Dr. Lowell Guitar, but patient has decided to decline any treatment of his AML.    At today's visit, he reports feeling poorly.  He reports extreme fatigue as well as persistent shortness of breath and cough.  He has been nauseous and having dizziness and headaches.  He reports a single episode of dark stool earlier this week, but does not want to go to the hospital or any other specialists for further work-up.  Patient was offered blood transfusion today due to Hgb 6.3, but he declined saying that he was "too tired to get blood today."  We discussed that his fatigue was in part due to his critical anemia and that he would likely continue to feel tired without blood transfusions.  He said that he will try to come in tomorrow to get blood.  He has little to no energy and 50% appetite.    REVIEW OF SYSTEMS:  Review of Systems  Constitutional:  Positive for appetite change and fatigue. Negative for chills, diaphoresis, fever and unexpected weight change.  HENT:    Negative for lump/mass and nosebleeds.   Eyes:  Negative for eye problems.  Respiratory:  Positive for cough and shortness of breath. Negative for hemoptysis.   Cardiovascular:  Negative for chest pain, leg swelling and palpitations.  Gastrointestinal:  Positive for blood in stool and nausea. Negative for abdominal pain, constipation, diarrhea and vomiting.  Genitourinary:  Negative for hematuria.   Skin: Negative.   Neurological:  Positive for dizziness and headaches. Negative for light-headedness.  Hematological:  Does not bruise/bleed easily.     PAST MEDICAL/SURGICAL HISTORY:  Past Medical History:  Diagnosis Date   Anemia of chronic disease 01/01/2020   Anxiety    Arthritis    Asthma    Atrial fibrillation (HCC)    a. s/p DCCV in 03/2019   CAD (coronary artery disease) 06/29/2019   Cramps of left lower extremity    Depression    Diabetes mellitus    type 2 for 7-8 yrs   Dyspnea    Elevated liver enzymes    Fatty liver    Gout    History of kidney stones    Hyperlipidemia    Hypertension    Renal insufficiency    Vertigo    Past Surgical History:  Procedure Laterality Date   BIOPSY  10/11/2020   Procedure: BIOPSY;  Surgeon: Dolores Frame, MD;  [facility];  Service: Gastroenterology;;   CARDIOVERSION N/A 04/21/2019   Procedure: CARDIOVERSION;  Surgeon: Thurmon Fair, MD;  Location: [facility] ENDOS[facility];  Service: Cardiovascular;  Laterality: N/A;   CARPAL TUNNEL RELEASE     both wrist   cataract surgery     bilateral   CHOLECYSTECTOMY     COLONOSCOPY  2011   COLONOSCOPY WITH PROPOFOL N/A 10/11/2020   Procedure: COLONOSCOPY WITH PROPOFOL;  Surgeon: Dolores Frame, MD;  Location: [facility];  Service: Gastroenterology;  Laterality: N/A;  AM   ESOPHAGOGASTRODUODENOSCOPY (EGD) WITH PROPOFOL N/A 10/11/2020   Procedure: ESOPHAGOGASTRODUODENOSCOPY (EGD) WITH PROPOFOL;  Surgeon: Dolores Frame, MD;  Location: [facility];   Service: Gastroenterology;  Laterality: N/A;   EYE SURGERY     HEMORROIDECTOMY     KNEE SURGERY Left    LEFT HEART CATH AND CORONARY ANGIOGRAPHY N/A 05/23/2019   Procedure: LEFT HEART CATH AND CORONARY ANGIOGRAPHY;  Surgeon: Lyn Records, MD;  Location: [facility] INVASIVE CV LAB;  Service: Cardiovascular;  Laterality: N/A;   LUMBAR LAMINECTOMY/DECOMPRESSION MICRODISCECTOMY N/A 08/06/2016   Procedure: LUMBAR THREE- LUMBAR FIVE  DECOMPRESSIVE LUMBAR LAMINECTOMY;  Surgeon: Shirlean Kelly, MD;  Location: [facility] OR;  Service: Neurosurgery;  Laterality: N/A;   POLYPECTOMY  10/11/2020   Procedure: POLYPECTOMY;  Surgeon: Marguerita Merles, Reuel Boom, MD;  Location: [facility];  Service: Gastroenterology;;     SOCIAL HISTORY:  Social History   Socioeconomic History   Marital status: Divorced    Spouse name: Not on file   Number of children: 2   Years of education: Not on file   Highest education level: Not on file  Occupational History   Occupation: RETIRED  Tobacco Use   Smoking status: Former    Packs/day: 4.00    Years: 20.00    Pack years: 80.00    Types: Cigarettes    Quit date: 10/14/1982    Years since quitting: 38.9   Smokeless tobacco: Never   Tobacco comments:    29 yrs ago  Vaping Use   Vaping Use: Never used  Substance and Sexual Activity   Alcohol use: No   Drug use: No   Sexual activity: Not Currently    Birth control/protection: None  Other Topics Concern   Not on file  Social History Narrative   Not on file   Social Determinants of Health   Financial Resource Strain: Medium Risk   Difficulty of Paying Living Expenses: Somewhat hard  Food Insecurity: No Food Insecurity   Worried About Programme researcher, broadcasting/film/video in the Last Year: Never true   Ran Out of Food in the Last Year: Never true  Transportation Needs: No Transportation Needs   Lack of Transportation (Medical): No   Lack of Transportation (Non-Medical): No  Physical Activity: Insufficiently Active   Days of  Exercise per Week: 4 days   Minutes of Exercise per Session: 10 min  Stress: No Stress Concern Present   Feeling of Stress : Not at all  Social Connections: Moderately Isolated   Frequency of Communication with Friends and Family: More than three times a week   Frequency of Social Gatherings with Friends and Family: More than three times a week   Attends Religious Services: More than 4 times per year   Active Member of [employer] or Organizations: No   Attends Banker Meetings: Never   Marital Status: Separated  Intimate Partner Violence: Not At Risk   Fear of Current or Ex-Partner: No   Emotionally Abused: No   Physically Abused: No   Sexually Abused: No    FAMILY HISTORY:  Family History  Problem Relation Age of Onset  Diabetes Mother    Cancer Mother        unknown kind   Diabetes Sister    COPD Sister    Liver disease Brother    COPD Brother    Diabetes Maternal Grandmother    Diabetes Brother    COPD Sister    Healthy Daughter    Healthy Daughter    Colon cancer Neg Hx     CURRENT MEDICATIONS:  Outpatient Encounter Medications as of 10/01/2021  Medication Sig   acetaminophen (TYLENOL) 650 MG CR tablet Take 650 mg by mouth every 8 (eight) hours as needed for pain.   allopurinol (ZYLOPRIM) 300 MG tablet TAKE 1 TABLET (300 MG TOTAL) BY MOUTH DAILY. START THIS AFTER GOUT FLARE.   atorvastatin (LIPITOR) 20 MG tablet TAKE 1 TABLET BY MOUTH EVERY DAY   augmented betamethasone dipropionate (DIPROLENE-AF) 0.05 % cream Apply topically.   BD PEN NEEDLE NANO 2ND GEN 32G X 4 MM MISC Use with insulin pen qid.   diltiazem (CARDIZEM CD) 360 MG 24 hr capsule TAKE 1 CAPSULE BY MOUTH EVERY DAY   empagliflozin (JARDIANCE) 10 MG TABS tablet Take 1 tablet (10 mg total) by mouth daily before breakfast.   ferrous sulfate 325 (65 FE) MG tablet Take 325 mg by mouth. Takes one every other day   LEVEMIR FLEXTOUCH 100 UNIT/ML FlexTouch Pen Inject 24 Units into the skin at bedtime.    metoprolol tartrate (LOPRESSOR) 25 MG tablet Take 1 tablet (25 mg total) by mouth 2 (two) times daily.   NOVOLOG FLEXPEN 100 UNIT/ML FlexPen INJECT 15 UNITS INTO THE SKIN 3 (THREE) TIMES DAILY WITH MEALS   ONETOUCH VERIO test strip TEST TWICE DAILY AS DIRECTED   potassium chloride (KLOR-CON) 10 MEQ tablet TAKE 1 TABLET BY MOUTH EVERY DAY   torsemide (DEMADEX) 20 MG tablet TAKE 1 TABLET BY MOUTH TWICE A DAY   traMADol (ULTRAM) 50 MG tablet Take 1-2 tab p.o. q8hrs prn pain (Patient taking differently: 50-100 mg every 6 (six) hours as needed for moderate pain.)   Facility-Administered Encounter Medications as of 10/01/2021  Medication   0.9 %  sodium chloride infusion (Manually program via Guardrails IV Fluids)    ALLERGIES:  Allergies  Allergen Reactions   Demerol Nausea And Vomiting   Meperidine Hcl Nausea And Vomiting   Vasotec [Enalapril] Cough   Lasix [Furosemide] Rash     PHYSICAL EXAM:  ECOG PERFORMANCE STATUS: 3 - Symptomatic, >50% confined to bed  There were no vitals filed for this visit. There were no vitals filed for this visit. Physical Exam Vitals reviewed.  Constitutional:      Appearance: Normal appearance.     Comments: Extremely weak and tired-appearing  Cardiovascular:     Rate and Rhythm: Normal rate and regular rhythm.     Pulses: Normal pulses.     Heart sounds: Normal heart sounds.  Pulmonary:     Effort: Pulmonary effort is normal.     Breath sounds: Normal breath sounds.  Neurological:     General: No focal deficit present.     Mental Status: He is alert and oriented to person, place, and time.  Psychiatric:        Mood and Affect: Mood normal.        Behavior: Behavior normal.     LABORATORY DATA:  I have reviewed the labs as listed.  CBC    Component Value Date/Time   WBC 1.4 (LL) 09/22/2021 1048   RBC 2.82 (L) 09/22/2021 1048  HGB 8.2 (L) 09/22/2021 1048   HGB 7.5 (L) 09/11/2021 0837   HGB 11.9 (L) 09/07/2018 1638   HCT 24.6 (L)  09/22/2021 1048   HCT 34.2 (L) 09/07/2018 1638   PLT 252 09/22/2021 1048   PLT 241 09/11/2021 0837   PLT 229 09/07/2018 1638   MCV 87.2 09/22/2021 1048   MCV 101 (H) 09/07/2018 1638   MCH 29.1 09/22/2021 1048   MCHC 33.3 09/22/2021 1048   RDW 14.5 09/22/2021 1048   RDW 12.8 09/07/2018 1638   LYMPHSABS 0.7 09/22/2021 1048   LYMPHSABS 1.3 09/07/2018 1638   MONOABS 0.3 09/22/2021 1048   EOSABS 0.0 09/22/2021 1048   EOSABS 0.2 09/07/2018 1638   BASOSABS 0.0 09/22/2021 1048   BASOSABS 0.1 09/07/2018 1638   CMP Latest Ref Rng & Units 09/22/2021 08/14/2021 08/13/2021  Glucose 70 - 99 mg/dL 269(H) 175(H) 231(H)  BUN 8 - 23 mg/dL 36(H) 50(H) 51(H)  Creatinine 0.61 - 1.24 mg/dL 1.74(H) 1.92(H) 2.22(H)  Sodium 135 - 145 mmol/L 134(L) 137 134(L)  Potassium 3.5 - 5.1 mmol/L 3.8 3.4(L) 3.5  Chloride 98 - 111 mmol/L 98 102 100  CO2 22 - 32 mmol/L 25 23 21(L)  Calcium 8.9 - 10.3 mg/dL 8.9 8.5(L) 8.3(L)  Total Protein 6.5 - 8.1 g/dL 7.3 6.1(L) -  Total Bilirubin 0.3 - 1.2 mg/dL 1.1 2.0(H) -  Alkaline Phos 38 - 126 U/L 129(H) 90 -  AST 15 - 41 U/L 18 25 -  ALT 0 - 44 U/L 19 27 -    DIAGNOSTIC IMAGING:  I have independently reviewed the relevant imaging and discussed with the patient.   ASSESSMENT & PLAN: 1.  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  Chromosome analysis and FISH panel were normal. - Patient received Retacrit injections up until 09/01/2021, when they were discontinued due to loss of effectiveness - Repeat bone marrow biopsy (09/11/2021): Hypercellular bone marrow with acute myeloid leukemia.  43% myeloblast population identified by flow cytometry.  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 - Patient was sent for consultation with Dr. Florene Glen of the leukemia clinic at [facility].  Per available records, he declined any  further work-up or treatment after speaking with them. - At today's visit, he feels extremely poor with severe fatigue, shortness of breath, and dyspnea on exertion. - CBC today shows Hgb 6.3.  Patient refused PRBC transfusion today, stating that he was "too tired to get blood."  He agrees to try to come back tomorrow for blood transfusion. - We had extensive discussion about his goals of care. We discussed that life expectancy without blood transfusions is likely 2 to 3 weeks at most. We discussed that if he chose to continue palliative blood transfusions, this may extend his life expectancy to several months. Patient verbalizes that he is "just tired."  He does not seem to want to continue blood transfusions and states that he would rather just be at home.  He states that "it doesn't look like I will be around that long either way, so I don't know if I want to bother with getting blood."  However, he is also hesitant to make a decision. We discussed that if he does not want to continue palliative blood transfusions, he would likely benefit from hospice services.  I explained that hospice would come to his house so that he would not  need to go to any more doctors appointments.  I explained that hospice would manage his symptoms and would make sure that his needs were taken care of when he was actively dying. We discussed that if he chose to continue palliative blood transfusions he would still qualify for palliative care services and could transition to hospice later on. Patient verbalized that he just wanted to be at home and that he did not want to have to come back to the hospital if his symptoms got worse.  When the time comes for him to die, he would prefer to die at home.  I explained to the patient that hospice services would be put in place to support those goals. Although the patient himself is hesitant to make any decisions, the goals and wishes that he expressed to certainly align with hospice  care.  He is agreeable to meeting with hospice provider to discuss this further with them so that he can better understand what services they offer.  He states that he will come in tomorrow for "one more blood transfusion" if he can. The patient's companion, Ivan Anchors, is also present during this discussion.  She indicates that she respects the patient's choice, even though this process is very difficult for her. - PLAN: Transfuse PRBC x1 tomorrow afternoon. - Referral sent to hospice for consultation. - We will follow-up with patient via telephone call next week to see if he has decided to establish with hospice, or if he would like to continue lab checks and as needed blood transfusions.   All questions were answered. The patient knows to call the clinic with any problems, questions or concerns.  Medical decision making: High (35-minute discussion about goals of care and end-of-life decision-making)  Time spent on visit: I spent 35 minutes counseling the patient face to face. The total time spent in the appointment was 55 minutes and more than 50% was on counseling.   Harriett Rush, PA-C  10/01/2021 1:23 PM

## 2021-10-01 ENCOUNTER — Other Ambulatory Visit: Payer: Self-pay

## 2021-10-01 ENCOUNTER — Inpatient Hospital Stay (HOSPITAL_COMMUNITY): Payer: Medicare HMO | Attending: Hematology | Admitting: Physician Assistant

## 2021-10-01 ENCOUNTER — Inpatient Hospital Stay (HOSPITAL_COMMUNITY): Payer: Medicare HMO

## 2021-10-01 VITALS — BP 118/57 | HR 73 | Temp 97.3°F | Resp 18 | Ht 69.0 in | Wt 179.0 lb

## 2021-10-01 DIAGNOSIS — I4891 Unspecified atrial fibrillation: Secondary | ICD-10-CM | POA: Insufficient documentation

## 2021-10-01 DIAGNOSIS — R69 Illness, unspecified: Secondary | ICD-10-CM | POA: Diagnosis not present

## 2021-10-01 DIAGNOSIS — R5383 Other fatigue: Secondary | ICD-10-CM | POA: Insufficient documentation

## 2021-10-01 DIAGNOSIS — F329 Major depressive disorder, single episode, unspecified: Secondary | ICD-10-CM | POA: Insufficient documentation

## 2021-10-01 DIAGNOSIS — C9200 Acute myeloblastic leukemia, not having achieved remission: Secondary | ICD-10-CM

## 2021-10-01 DIAGNOSIS — Z87442 Personal history of urinary calculi: Secondary | ICD-10-CM | POA: Insufficient documentation

## 2021-10-01 DIAGNOSIS — I129 Hypertensive chronic kidney disease with stage 1 through stage 4 chronic kidney disease, or unspecified chronic kidney disease: Secondary | ICD-10-CM | POA: Insufficient documentation

## 2021-10-01 DIAGNOSIS — N189 Chronic kidney disease, unspecified: Secondary | ICD-10-CM | POA: Insufficient documentation

## 2021-10-01 DIAGNOSIS — I2510 Atherosclerotic heart disease of native coronary artery without angina pectoris: Secondary | ICD-10-CM | POA: Diagnosis not present

## 2021-10-01 DIAGNOSIS — E119 Type 2 diabetes mellitus without complications: Secondary | ICD-10-CM | POA: Diagnosis not present

## 2021-10-01 DIAGNOSIS — R0602 Shortness of breath: Secondary | ICD-10-CM | POA: Insufficient documentation

## 2021-10-01 DIAGNOSIS — D631 Anemia in chronic kidney disease: Secondary | ICD-10-CM | POA: Diagnosis not present

## 2021-10-01 DIAGNOSIS — E785 Hyperlipidemia, unspecified: Secondary | ICD-10-CM | POA: Diagnosis not present

## 2021-10-01 DIAGNOSIS — Z79899 Other long term (current) drug therapy: Secondary | ICD-10-CM | POA: Diagnosis not present

## 2021-10-01 DIAGNOSIS — D469 Myelodysplastic syndrome, unspecified: Secondary | ICD-10-CM

## 2021-10-01 LAB — CBC WITH DIFFERENTIAL/PLATELET
Abs Immature Granulocytes: 0.31 10*3/uL — ABNORMAL HIGH (ref 0.00–0.07)
Basophils Absolute: 0 10*3/uL (ref 0.0–0.1)
Basophils Relative: 0 %
Eosinophils Absolute: 0 10*3/uL (ref 0.0–0.5)
Eosinophils Relative: 0 %
HCT: 19.2 % — ABNORMAL LOW (ref 39.0–52.0)
Hemoglobin: 6.3 g/dL — CL (ref 13.0–17.0)
Immature Granulocytes: 8 %
Lymphocytes Relative: 28 %
Lymphs Abs: 1 10*3/uL (ref 0.7–4.0)
MCH: 29 pg (ref 26.0–34.0)
MCHC: 32.8 g/dL (ref 30.0–36.0)
MCV: 88.5 fL (ref 80.0–100.0)
Monocytes Absolute: 0.7 10*3/uL (ref 0.1–1.0)
Monocytes Relative: 18 %
Neutro Abs: 1.7 10*3/uL (ref 1.7–7.7)
Neutrophils Relative %: 46 %
Platelets: 275 10*3/uL (ref 150–400)
RBC: 2.17 MIL/uL — ABNORMAL LOW (ref 4.22–5.81)
RDW: 14.6 % (ref 11.5–15.5)
WBC: 3.7 10*3/uL — ABNORMAL LOW (ref 4.0–10.5)
nRBC: 0 % (ref 0.0–0.2)

## 2021-10-01 LAB — PREPARE RBC (CROSSMATCH)

## 2021-10-01 LAB — SAMPLE TO BLOOD BANK

## 2021-10-01 NOTE — Progress Notes (Unsigned)
CRITICAL VALUE ALERT ?Critical value received:  hgb 6.3 ?Date of notification:  10-01-21 ?Time of notification: 3539 ?Critical value read back:  Yes.   ?Nurse who received alert:  C. Nhu Glasby RN ?MD notified time and response:  1038, Seeing MD today.   ?

## 2021-10-01 NOTE — Progress Notes (Signed)
Referral sent to [facility] with a request for a Friday visit for assessment, if feasible. ?

## 2021-10-01 NOTE — Progress Notes (Signed)
Verbal order for transfusion of one unit of blood on 10-02-21 per Reb. Pennington PA-C. Order in and blood bank is aware. ?

## 2021-10-02 ENCOUNTER — Other Ambulatory Visit (HOSPITAL_COMMUNITY): Payer: Self-pay | Admitting: Physician Assistant

## 2021-10-02 ENCOUNTER — Inpatient Hospital Stay (HOSPITAL_COMMUNITY): Payer: Medicare HMO

## 2021-10-02 VITALS — BP 123/63 | HR 67 | Temp 98.0°F | Resp 18

## 2021-10-02 DIAGNOSIS — I129 Hypertensive chronic kidney disease with stage 1 through stage 4 chronic kidney disease, or unspecified chronic kidney disease: Secondary | ICD-10-CM | POA: Diagnosis not present

## 2021-10-02 DIAGNOSIS — R5383 Other fatigue: Secondary | ICD-10-CM | POA: Diagnosis not present

## 2021-10-02 DIAGNOSIS — E119 Type 2 diabetes mellitus without complications: Secondary | ICD-10-CM | POA: Diagnosis not present

## 2021-10-02 DIAGNOSIS — C9200 Acute myeloblastic leukemia, not having achieved remission: Secondary | ICD-10-CM

## 2021-10-02 DIAGNOSIS — G4701 Insomnia due to medical condition: Secondary | ICD-10-CM

## 2021-10-02 DIAGNOSIS — D631 Anemia in chronic kidney disease: Secondary | ICD-10-CM | POA: Diagnosis not present

## 2021-10-02 DIAGNOSIS — I4891 Unspecified atrial fibrillation: Secondary | ICD-10-CM | POA: Diagnosis not present

## 2021-10-02 DIAGNOSIS — N189 Chronic kidney disease, unspecified: Secondary | ICD-10-CM | POA: Diagnosis not present

## 2021-10-02 DIAGNOSIS — I2510 Atherosclerotic heart disease of native coronary artery without angina pectoris: Secondary | ICD-10-CM | POA: Diagnosis not present

## 2021-10-02 DIAGNOSIS — R69 Illness, unspecified: Secondary | ICD-10-CM | POA: Diagnosis not present

## 2021-10-02 DIAGNOSIS — R0602 Shortness of breath: Secondary | ICD-10-CM | POA: Diagnosis not present

## 2021-10-02 MED ORDER — SODIUM CHLORIDE 0.9% IV SOLUTION
250.0000 mL | Freq: Once | INTRAVENOUS | Status: AC
  Administered 2021-10-02: 13:00:00 250 mL via INTRAVENOUS

## 2021-10-02 MED ORDER — SODIUM CHLORIDE 0.9% FLUSH: 10.0000 mL | INTRAVENOUS | Status: DC | PRN

## 2021-10-02 MED ORDER — DIPHENHYDRAMINE HCL 25 MG PO CAPS
25.0000 mg | ORAL_CAPSULE | Freq: Once | ORAL | Status: AC
  Administered 2021-10-02: 13:00:00 25 mg via ORAL
  Filled 2021-10-02: qty 1

## 2021-10-02 MED ORDER — TRAZODONE HCL 50 MG PO TABS: 50.0000 mg | ORAL_TABLET | Freq: Every day | ORAL | 0 refills | Status: DC

## 2021-10-02 MED ORDER — ACETAMINOPHEN 325 MG PO TABS
650.0000 mg | ORAL_TABLET | Freq: Once | ORAL | Status: AC
  Administered 2021-10-02: 13:00:00 650 mg via ORAL
  Filled 2021-10-02: qty 2

## 2021-10-02 NOTE — Patient Instructions (Signed)
[facility]  Discharge Instructions: Thank you for choosing [facility] to provide your oncology and hematology care.  If you have a lab appointment with the Cancer Center, please come in thru the Main Entrance and check in at the main information desk.  Wear comfortable clothing and clothing appropriate for easy access to any Portacath or PICC line.   We strive to give you quality time with your provider. You may need to reschedule your appointment if you arrive late (15 or more minutes).  Arriving late affects you and other patients whose appointments are after yours.  Also, if you miss three or more appointments without notifying the office, you may be dismissed from the clinic at the provider's discretion.      For prescription refill requests, have your pharmacy contact our office and allow 72 hours for refills to be completed.        To help prevent nausea and vomiting after your treatment, we encourage you to take your nausea medication as directed.  BELOW ARE SYMPTOMS THAT SHOULD BE REPORTED IMMEDIATELY: *FEVER GREATER THAN 100.4 F (38 C) OR HIGHER *CHILLS OR SWEATING *NAUSEA AND VOMITING THAT IS NOT CONTROLLED WITH YOUR NAUSEA MEDICATION *UNUSUAL SHORTNESS OF BREATH *UNUSUAL BRUISING OR BLEEDING *URINARY PROBLEMS (pain or burning when urinating, or frequent urination) *BOWEL PROBLEMS (unusual diarrhea, constipation, pain near the anus) TENDERNESS IN MOUTH AND THROAT WITH OR WITHOUT PRESENCE OF ULCERS (sore throat, sores in mouth, or a toothache) UNUSUAL RASH, SWELLING OR PAIN  UNUSUAL VAGINAL DISCHARGE OR ITCHING   Items with * indicate a potential emergency and should be followed up as soon as possible or go to the Emergency Department if any problems should occur.  Please show the CHEMOTHERAPY ALERT CARD or IMMUNOTHERAPY ALERT CARD at check-in to the Emergency Department and triage nurse.  Should you have questions after your visit or need to cancel  or reschedule your appointment, please contact  CANCER CENTER [phone]  and follow the prompts.  Office hours are 8:00 a.m. to 4:30 p.m. Monday - Friday. Please note that voicemails left after 4:00 p.m. may not be returned until the following business day.  We are closed weekends and major holidays. You have access to a nurse at all times for urgent questions. Please call the main number to the clinic [phone] and follow the prompts.  For any non-urgent questions, you may also contact your provider using MyChart. We now offer e-Visits for anyone 18 and older to request care online for non-urgent symptoms. For details visit [email or web address].   Also download the MyChart app! Go to the app store, search "MyChart", open the app, select [facility], and log in with your MyChart username and password.  Due to Covid, a mask is required upon entering the hospital/clinic. If you do not have a mask, one will be given to you upon arrival. For doctor visits, patients may have 1 support person aged 18 or older with them. For treatment visits, patients cannot have anyone with them due to current Covid guidelines and our immunocompromised population.  

## 2021-10-02 NOTE — Progress Notes (Signed)
Patient is having difficulty sleeping. ?Prescription sent for trazodone 50 mg with instruction to take 1 tablet nightly - can increase to 2 tablets nightly if no improvement with lower dose. ?Patient has a consultation with hospice later this week - explained that hospice will take care of any future medication needs. ?

## 2021-10-02 NOTE — Progress Notes (Signed)
Patient presents today for one unit of PRBC.  Patient is in satisfactory condition with no new complaints voiced. Vital signs are stable.  Hemoglobin was 6.3 on 10/01/2021.  We will proceed with transfusion per orders from [facility] [place].  ? ?Patient tolerated transfusions well with no complaints voiced.  Patient left via wheelchair with wife in stable condition.  Vital signs stable at discharge.  Follow up as scheduled.    ?

## 2021-10-03 ENCOUNTER — Other Ambulatory Visit: Payer: Self-pay | Admitting: Family Medicine

## 2021-10-03 LAB — MISC LABCORP TEST (SEND OUT): Labcorp test code: 451953

## 2021-10-03 LAB — TYPE AND SCREEN
ABO/RH(D): O POS
Antibody Screen: NEGATIVE
Unit division: 0

## 2021-10-03 LAB — BPAM RBC
Blood Product Expiration Date: 202304092359
ISSUE DATE / TIME: 202303091341
Unit Type and Rh: 5100

## 2021-10-06 ENCOUNTER — Encounter (HOSPITAL_COMMUNITY): Payer: Self-pay

## 2021-10-06 NOTE — Progress Notes (Signed)
Patient's significant other called and stated that the patient has opted to have weekly lab draws and possible blood transfusions. Schedule pending. ?

## 2021-10-07 ENCOUNTER — Telehealth (HOSPITAL_COMMUNITY): Payer: Self-pay | Admitting: Physician Assistant

## 2021-10-07 NOTE — Telephone Encounter (Signed)
I spoke with the patient's designated representative Ivan Anchors), who updated me that the patient has signed up to receive hospice services from [facility], and that they have assumed management of his medical care.  They will allow him to continue to receive palliative blood transfusions once per week, so we will schedule the patient for CBC/sample to blood bank and transfusion every week.  Since the patient will continue to receive blood products at our clinic, I will schedule him for a tentative follow-up visit with me in 2 months. ? ?Tarri Abernethy PA-C ?10/07/2021 5:21 PM ?

## 2021-10-08 ENCOUNTER — Other Ambulatory Visit: Payer: Self-pay | Admitting: Family Medicine

## 2021-10-08 ENCOUNTER — Inpatient Hospital Stay (HOSPITAL_COMMUNITY): Payer: Medicare HMO

## 2021-10-08 DIAGNOSIS — N189 Chronic kidney disease, unspecified: Secondary | ICD-10-CM | POA: Diagnosis not present

## 2021-10-08 DIAGNOSIS — I2510 Atherosclerotic heart disease of native coronary artery without angina pectoris: Secondary | ICD-10-CM | POA: Diagnosis not present

## 2021-10-08 DIAGNOSIS — R69 Illness, unspecified: Secondary | ICD-10-CM | POA: Diagnosis not present

## 2021-10-08 DIAGNOSIS — R0602 Shortness of breath: Secondary | ICD-10-CM | POA: Diagnosis not present

## 2021-10-08 DIAGNOSIS — R5383 Other fatigue: Secondary | ICD-10-CM | POA: Diagnosis not present

## 2021-10-08 DIAGNOSIS — I10 Essential (primary) hypertension: Secondary | ICD-10-CM

## 2021-10-08 DIAGNOSIS — E119 Type 2 diabetes mellitus without complications: Secondary | ICD-10-CM | POA: Diagnosis not present

## 2021-10-08 DIAGNOSIS — I4891 Unspecified atrial fibrillation: Secondary | ICD-10-CM | POA: Diagnosis not present

## 2021-10-08 DIAGNOSIS — D631 Anemia in chronic kidney disease: Secondary | ICD-10-CM | POA: Diagnosis not present

## 2021-10-08 DIAGNOSIS — I129 Hypertensive chronic kidney disease with stage 1 through stage 4 chronic kidney disease, or unspecified chronic kidney disease: Secondary | ICD-10-CM | POA: Diagnosis not present

## 2021-10-08 DIAGNOSIS — C9200 Acute myeloblastic leukemia, not having achieved remission: Secondary | ICD-10-CM | POA: Diagnosis not present

## 2021-10-08 DIAGNOSIS — D469 Myelodysplastic syndrome, unspecified: Secondary | ICD-10-CM

## 2021-10-08 LAB — SAMPLE TO BLOOD BANK

## 2021-10-08 LAB — CBC WITH DIFFERENTIAL/PLATELET
Band Neutrophils: 10 %
Basophils Absolute: 0 10*3/uL (ref 0.0–0.1)
Basophils Relative: 0 %
Eosinophils Absolute: 0 10*3/uL (ref 0.0–0.5)
Eosinophils Relative: 0 %
HCT: 19.8 % — ABNORMAL LOW (ref 39.0–52.0)
Hemoglobin: 6.6 g/dL — CL (ref 13.0–17.0)
Lymphocytes Relative: 28 %
Lymphs Abs: 0.7 10*3/uL (ref 0.7–4.0)
MCH: 29.6 pg (ref 26.0–34.0)
MCHC: 33.3 g/dL (ref 30.0–36.0)
MCV: 88.8 fL (ref 80.0–100.0)
Metamyelocytes Relative: 1 %
Monocytes Absolute: 0.5 10*3/uL (ref 0.1–1.0)
Monocytes Relative: 21 %
Neutro Abs: 1.3 10*3/uL — ABNORMAL LOW (ref 1.7–7.7)
Neutrophils Relative %: 40 %
Platelets: 142 10*3/uL — ABNORMAL LOW (ref 150–400)
RBC: 2.23 MIL/uL — ABNORMAL LOW (ref 4.22–5.81)
RDW: 14.3 % (ref 11.5–15.5)
WBC: 2.6 10*3/uL — ABNORMAL LOW (ref 4.0–10.5)
nRBC: 0 % (ref 0.0–0.2)

## 2021-10-08 NOTE — Progress Notes (Unsigned)
CRITICAL VALUE ALERT ?Critical value received:  hgb 6.6 ?Date of notification:  10-08-21 ?Time of notification: 1155 ?Critical value read back:  Yes.   ?Nurse who received alert:  C.Carrianne Hyun RN ?MD notified time and response:  will give one unit of blood on Friday the 17th. 1157  ?

## 2021-10-09 ENCOUNTER — Ambulatory Visit: Payer: Medicare HMO | Admitting: Family Medicine

## 2021-10-09 LAB — PREPARE RBC (CROSSMATCH)

## 2021-10-10 ENCOUNTER — Ambulatory Visit (HOSPITAL_COMMUNITY): Payer: Medicare HMO

## 2021-10-10 ENCOUNTER — Other Ambulatory Visit: Payer: Self-pay | Admitting: Family Medicine

## 2021-10-10 ENCOUNTER — Encounter (HOSPITAL_COMMUNITY): Payer: Self-pay

## 2021-10-10 DIAGNOSIS — D469 Myelodysplastic syndrome, unspecified: Secondary | ICD-10-CM

## 2021-10-10 NOTE — Progress Notes (Signed)
Patient's significant other, Webb Silversmith, called stating that patient is weak and is having diarrhea, requested to cancel appt to receive blood products today. Educated Energy manager on how to manage diarrhea at home with Imodium. Webb Silversmith also made aware that blood bank sample will not be good and if patient still needs blood next week, he will need to start the BB sampling process over. Verbalized understanding. Transferred to scheduling to reschedule appts. Primary RN made aware that patient is cancelling. ?

## 2021-10-12 LAB — TYPE AND SCREEN
ABO/RH(D): O POS
Antibody Screen: NEGATIVE
Unit division: 0

## 2021-10-12 LAB — BPAM RBC
Blood Product Expiration Date: 202304192359
Unit Type and Rh: 5100

## 2021-10-13 ENCOUNTER — Other Ambulatory Visit: Payer: Self-pay

## 2021-10-13 ENCOUNTER — Inpatient Hospital Stay (HOSPITAL_COMMUNITY): Payer: Medicare HMO

## 2021-10-13 ENCOUNTER — Telehealth (HOSPITAL_COMMUNITY): Payer: Self-pay | Admitting: *Deleted

## 2021-10-13 DIAGNOSIS — D469 Myelodysplastic syndrome, unspecified: Secondary | ICD-10-CM

## 2021-10-13 DIAGNOSIS — I4891 Unspecified atrial fibrillation: Secondary | ICD-10-CM | POA: Diagnosis not present

## 2021-10-13 DIAGNOSIS — R0602 Shortness of breath: Secondary | ICD-10-CM | POA: Diagnosis not present

## 2021-10-13 DIAGNOSIS — R5383 Other fatigue: Secondary | ICD-10-CM | POA: Diagnosis not present

## 2021-10-13 DIAGNOSIS — D631 Anemia in chronic kidney disease: Secondary | ICD-10-CM | POA: Diagnosis not present

## 2021-10-13 DIAGNOSIS — I129 Hypertensive chronic kidney disease with stage 1 through stage 4 chronic kidney disease, or unspecified chronic kidney disease: Secondary | ICD-10-CM | POA: Diagnosis not present

## 2021-10-13 DIAGNOSIS — R69 Illness, unspecified: Secondary | ICD-10-CM | POA: Diagnosis not present

## 2021-10-13 DIAGNOSIS — N189 Chronic kidney disease, unspecified: Secondary | ICD-10-CM | POA: Diagnosis not present

## 2021-10-13 DIAGNOSIS — E119 Type 2 diabetes mellitus without complications: Secondary | ICD-10-CM | POA: Diagnosis not present

## 2021-10-13 DIAGNOSIS — I2510 Atherosclerotic heart disease of native coronary artery without angina pectoris: Secondary | ICD-10-CM | POA: Diagnosis not present

## 2021-10-13 DIAGNOSIS — C9200 Acute myeloblastic leukemia, not having achieved remission: Secondary | ICD-10-CM | POA: Diagnosis not present

## 2021-10-13 LAB — CBC WITH DIFFERENTIAL/PLATELET
Band Neutrophils: 7 %
Basophils Absolute: 0 10*3/uL (ref 0.0–0.1)
Basophils Relative: 0 %
Eosinophils Absolute: 0.1 10*3/uL (ref 0.0–0.5)
Eosinophils Relative: 2 %
HCT: 18 % — ABNORMAL LOW (ref 39.0–52.0)
Hemoglobin: 5.8 g/dL — CL (ref 13.0–17.0)
Lymphocytes Relative: 25 %
Lymphs Abs: 1.5 10*3/uL (ref 0.7–4.0)
MCH: 28.6 pg (ref 26.0–34.0)
MCHC: 32.2 g/dL (ref 30.0–36.0)
MCV: 88.7 fL (ref 80.0–100.0)
Metamyelocytes Relative: 2 %
Monocytes Absolute: 0.2 10*3/uL (ref 0.1–1.0)
Monocytes Relative: 3 %
Myelocytes: 5 %
Neutro Abs: 3.6 10*3/uL (ref 1.7–7.7)
Neutrophils Relative %: 53 %
Platelets: 135 10*3/uL — ABNORMAL LOW (ref 150–400)
Promyelocytes Relative: 3 %
RBC: 2.03 MIL/uL — ABNORMAL LOW (ref 4.22–5.81)
RDW: 14.4 % (ref 11.5–15.5)
WBC: 6 10*3/uL (ref 4.0–10.5)
nRBC: 0 % (ref 0.0–0.2)

## 2021-10-13 LAB — SAMPLE TO BLOOD BANK

## 2021-10-13 LAB — PREPARE RBC (CROSSMATCH)

## 2021-10-13 MED ORDER — SODIUM CHLORIDE 0.9% IV SOLUTION
250.0000 mL | Freq: Once | INTRAVENOUS | Status: AC
  Administered 2021-10-13: 250 mL via INTRAVENOUS

## 2021-10-13 MED ORDER — DIPHENHYDRAMINE HCL 25 MG PO CAPS
25.0000 mg | ORAL_CAPSULE | Freq: Once | ORAL | Status: AC
  Administered 2021-10-13: 25 mg via ORAL
  Filled 2021-10-13: qty 1

## 2021-10-13 MED ORDER — SODIUM CHLORIDE 0.9% FLUSH: 10.0000 mL | INTRAVENOUS | Status: DC | PRN

## 2021-10-13 MED ORDER — ACETAMINOPHEN 325 MG PO TABS
650.0000 mg | ORAL_TABLET | Freq: Once | ORAL | Status: AC
  Administered 2021-10-13: 650 mg via ORAL
  Filled 2021-10-13: qty 2

## 2021-10-13 NOTE — Progress Notes (Signed)
CRITICAL VALUE ALERT ?Critical value received:  hgb 5.8 ?Date of notification:  10-13-21 ?Time of notification: 1051 ?Critical value read back:  Yes.   ?Nurse who received alert:  C. Jamiel Goncalves RN ?MD notified time and response:  1055, will give one unit of blood per orders.  ? ?Will give one more unit of blood today per Reb. Pennington PA-C.  ?

## 2021-10-13 NOTE — Patient Instructions (Signed)
[facility]  Discharge Instructions: ?Thank you for choosing [facility] to provide your oncology and hematology care.  ?If you have a lab appointment with the [facility], please come in thru the Main Entrance and check in at the main information desk. ? ?Wear comfortable clothing and clothing appropriate for easy access to any Portacath or PICC line.  ? ?We strive to give you quality time with your provider. You may need to reschedule your appointment if you arrive late (15 or more minutes).  Arriving late affects you and other patients whose appointments are after yours.  Also, if you miss three or more appointments without notifying the office, you may be dismissed from the clinic at the provider?s discretion.    ?  ?For prescription refill requests, have your pharmacy contact our office and allow 72 hours for refills to be completed.   ? ?Today you received the following chemotherapy and/or immunotherapy agents 2uprbc    ?  ?To help prevent nausea and vomiting after your treatment, we encourage you to take your nausea medication as directed. ? ?BELOW ARE SYMPTOMS THAT SHOULD BE REPORTED IMMEDIATELY: ?*FEVER GREATER THAN 100.4 F (38 ?C) OR HIGHER ?*CHILLS OR SWEATING ?*NAUSEA AND VOMITING THAT IS NOT CONTROLLED WITH YOUR NAUSEA MEDICATION ?*UNUSUAL SHORTNESS OF BREATH ?*UNUSUAL BRUISING OR BLEEDING ?*URINARY PROBLEMS (pain or burning when urinating, or frequent urination) ?*BOWEL PROBLEMS (unusual diarrhea, constipation, pain near the anus) ?TENDERNESS IN MOUTH AND THROAT WITH OR WITHOUT PRESENCE OF ULCERS (sore throat, sores in mouth, or a toothache) ?UNUSUAL RASH, SWELLING OR PAIN  ?UNUSUAL VAGINAL DISCHARGE OR ITCHING  ? ?Items with * indicate a potential emergency and should be followed up as soon as possible or go to the Emergency Department if any problems should occur. ? ?Please show the CHEMOTHERAPY ALERT CARD or IMMUNOTHERAPY ALERT CARD at check-in to the Emergency  Department and triage nurse. ? ?Should you have questions after your visit or need to cancel or reschedule your appointment, please contact [facility] [phone]  and follow the prompts.  Office hours are 8:00 a.m. to 4:30 p.m. Monday - Friday. Please note that voicemails left after 4:00 p.m. may not be returned until the following business day.  We are closed weekends and major holidays. You have access to a nurse at all times for urgent questions. Please call the main number to the clinic [phone] and follow the prompts. ? ?For any non-urgent questions, you may also contact your provider using MyChart. We now offer e-Visits for anyone 45 and older to request care online for non-urgent symptoms. For details visit [email or web address]. ?  ?Also download the MyChart app! Go to the app store, search "MyChart", open the app, select , and log in with your MyChart username and password. ? ?Due to Covid, a mask is required upon entering the hospital/clinic. If you do not have a mask, one will be given to you upon arrival. For doctor visits, patients may have 1 support person aged 44 or older with them. For treatment visits, patients cannot have anyone with them due to current Covid guidelines and our immunocompromised population.  ?

## 2021-10-13 NOTE — Progress Notes (Signed)
Patient presents today for blood transfusion per providers order.  Hgb 5.8, per provider patient will receive 2 UPRBC. ? ?2UPRBC given today per MD orders.  Stable during infusion without adverse affects.  Vital signs stable.  No complaints at this time.  Discharge from clinic via wheelchair in stable condition.  Alert and oriented X 3.  Follow up with [facility] as scheduled.  ?

## 2021-10-13 NOTE — Telephone Encounter (Signed)
Per Anderson Malta, RN at [facility], she has received approval for him to receive 2 units or PRB'c today. ?

## 2021-10-14 LAB — BPAM RBC
Blood Product Expiration Date: 202304242359
Blood Product Expiration Date: 202304242359
ISSUE DATE / TIME: 202303201206
ISSUE DATE / TIME: 202303201359
Unit Type and Rh: 5100
Unit Type and Rh: 5100

## 2021-10-14 LAB — TYPE AND SCREEN
ABO/RH(D): O POS
Antibody Screen: NEGATIVE
Unit division: 0
Unit division: 0

## 2021-10-15 ENCOUNTER — Inpatient Hospital Stay (HOSPITAL_COMMUNITY): Payer: Medicare HMO

## 2021-10-15 ENCOUNTER — Other Ambulatory Visit (HOSPITAL_COMMUNITY): Payer: Medicare HMO

## 2021-10-20 ENCOUNTER — Ambulatory Visit: Payer: Medicare HMO | Admitting: Cardiology

## 2021-10-22 ENCOUNTER — Other Ambulatory Visit (HOSPITAL_COMMUNITY): Payer: Medicare HMO

## 2021-10-22 ENCOUNTER — Inpatient Hospital Stay (HOSPITAL_COMMUNITY): Payer: Medicare HMO

## 2021-10-25 ENCOUNTER — Other Ambulatory Visit (HOSPITAL_COMMUNITY): Payer: Self-pay | Admitting: Physician Assistant

## 2021-10-25 DIAGNOSIS — G4701 Insomnia due to medical condition: Secondary | ICD-10-CM

## 2021-10-29 ENCOUNTER — Encounter (HOSPITAL_COMMUNITY): Payer: Self-pay

## 2021-10-29 ENCOUNTER — Other Ambulatory Visit (HOSPITAL_COMMUNITY): Payer: Medicare HMO

## 2021-10-29 ENCOUNTER — Inpatient Hospital Stay (HOSPITAL_COMMUNITY): Payer: Medicare HMO

## 2021-10-31 ENCOUNTER — Other Ambulatory Visit: Payer: Medicare HMO | Admitting: Urology

## 2021-11-05 ENCOUNTER — Other Ambulatory Visit (HOSPITAL_COMMUNITY): Payer: Medicare HMO

## 2021-11-05 ENCOUNTER — Inpatient Hospital Stay (HOSPITAL_COMMUNITY): Payer: Medicare HMO

## 2021-11-12 ENCOUNTER — Inpatient Hospital Stay (HOSPITAL_COMMUNITY): Payer: Medicare HMO

## 2021-11-12 ENCOUNTER — Other Ambulatory Visit (HOSPITAL_COMMUNITY): Payer: Medicare HMO

## 2021-11-19 ENCOUNTER — Other Ambulatory Visit (HOSPITAL_COMMUNITY): Payer: Medicare HMO

## 2021-11-19 ENCOUNTER — Inpatient Hospital Stay (HOSPITAL_COMMUNITY): Payer: Medicare HMO

## 2021-11-24 DEATH — deceased

## 2021-11-26 ENCOUNTER — Other Ambulatory Visit (HOSPITAL_COMMUNITY): Payer: Medicare HMO

## 2021-11-26 ENCOUNTER — Inpatient Hospital Stay (HOSPITAL_COMMUNITY): Payer: Medicare HMO

## 2021-12-03 ENCOUNTER — Inpatient Hospital Stay (HOSPITAL_COMMUNITY): Payer: Medicare HMO

## 2021-12-03 ENCOUNTER — Other Ambulatory Visit (HOSPITAL_COMMUNITY): Payer: Medicare HMO

## 2021-12-10 ENCOUNTER — Other Ambulatory Visit (HOSPITAL_COMMUNITY): Payer: Medicare HMO

## 2021-12-10 ENCOUNTER — Encounter (HOSPITAL_COMMUNITY): Payer: Medicare HMO

## 2021-12-10 ENCOUNTER — Ambulatory Visit (HOSPITAL_COMMUNITY): Payer: Medicare HMO | Admitting: Physician Assistant

## 2022-07-03 IMAGING — DX DG CHEST 1V PORT
1 series · 1 of 1 positions shown · non-contrast
Comparison: Chest x-ray dated 06/27/2020

CLINICAL DATA: Sepsis, acute respiratory failure, pneumonia and
pulmonary edema, acute on chronic CHF.

EXAM:
PORTABLE CHEST 1 VIEW

[chest ap]
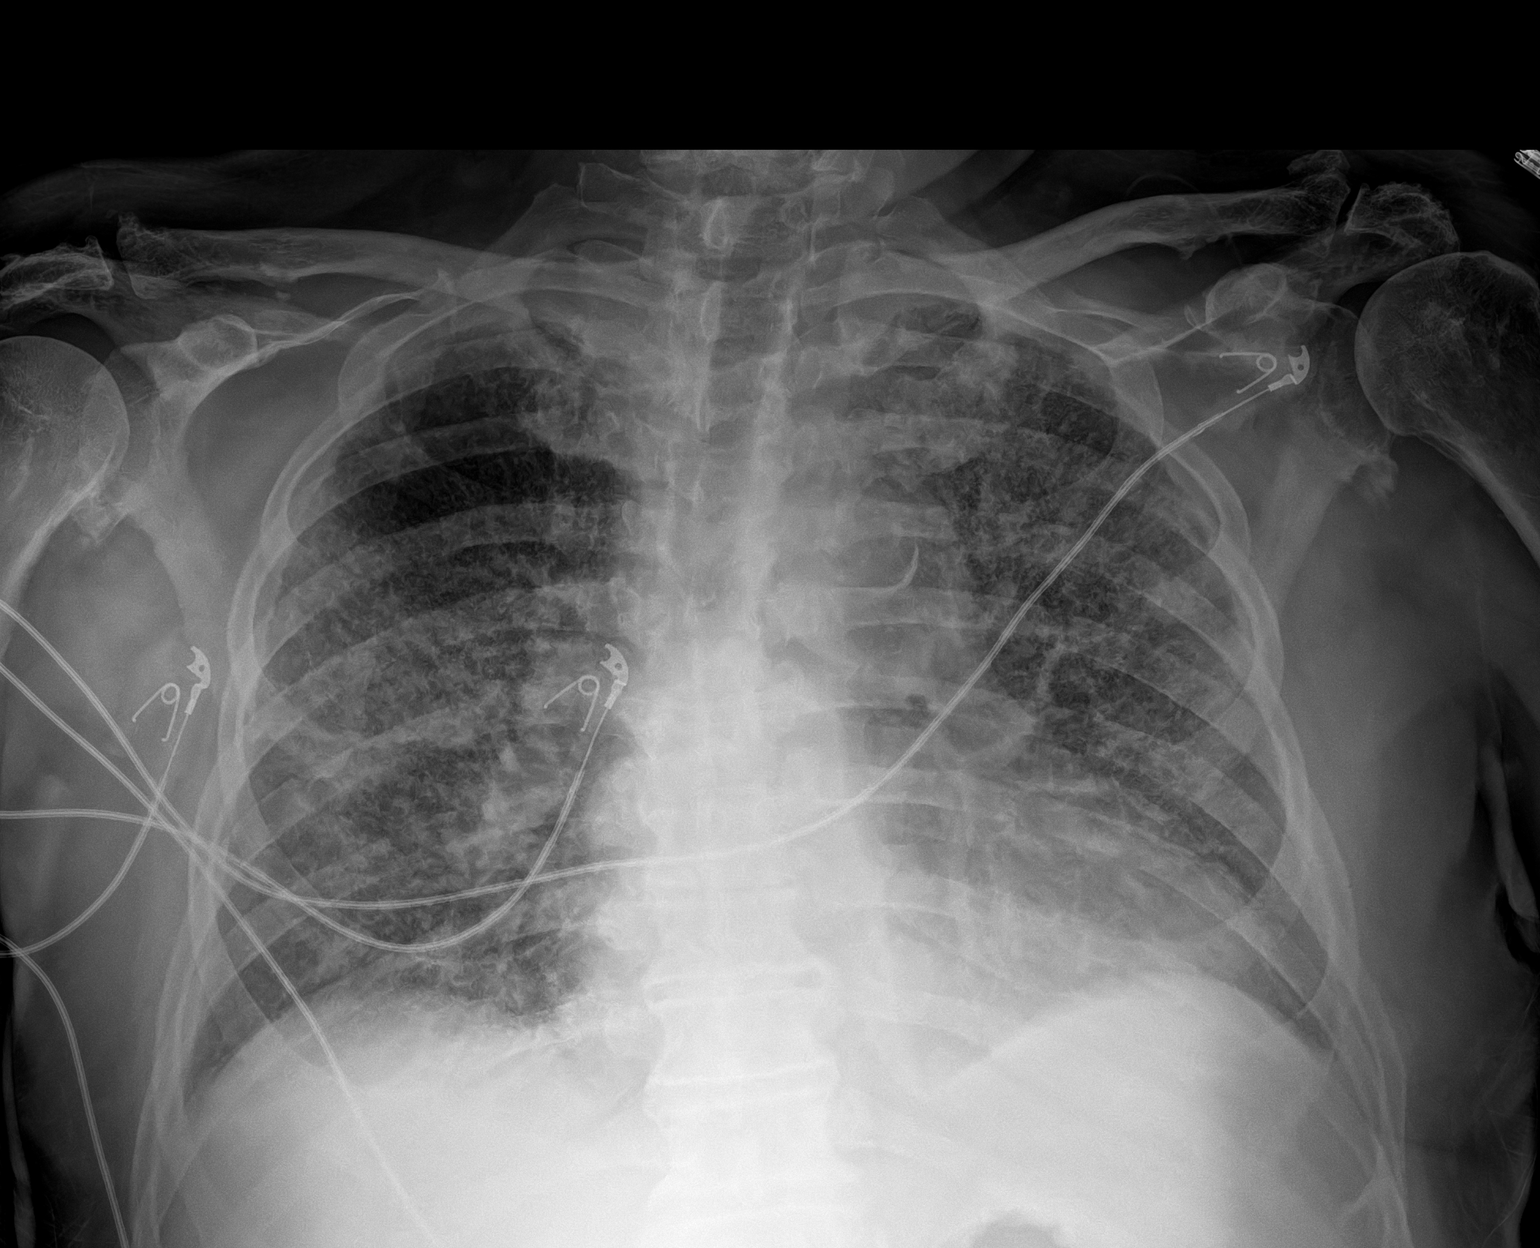

[1 of 1 positions shown; findings below may reference images not displayed]

FINDINGS: Persistent bilateral airspace opacities, but slightly improved
aeration of both lungs. No pleural effusion or pneumothorax is seen.
Heart size and mediastinal contours are grossly stable.
IMPRESSION: Slightly improved aeration of both lungs, but with persistent
bilateral airspace opacities, compatible with multifocal pneumonia
versus pulmonary edema.

## 2022-07-05 IMAGING — DX DG CHEST 1V PORT
1 series · 1 of 1 positions shown · non-contrast
Comparison: 06/30/2020.

CLINICAL DATA: Pneumonitis.

EXAM:
PORTABLE CHEST 1 VIEW

[chest ap grid]
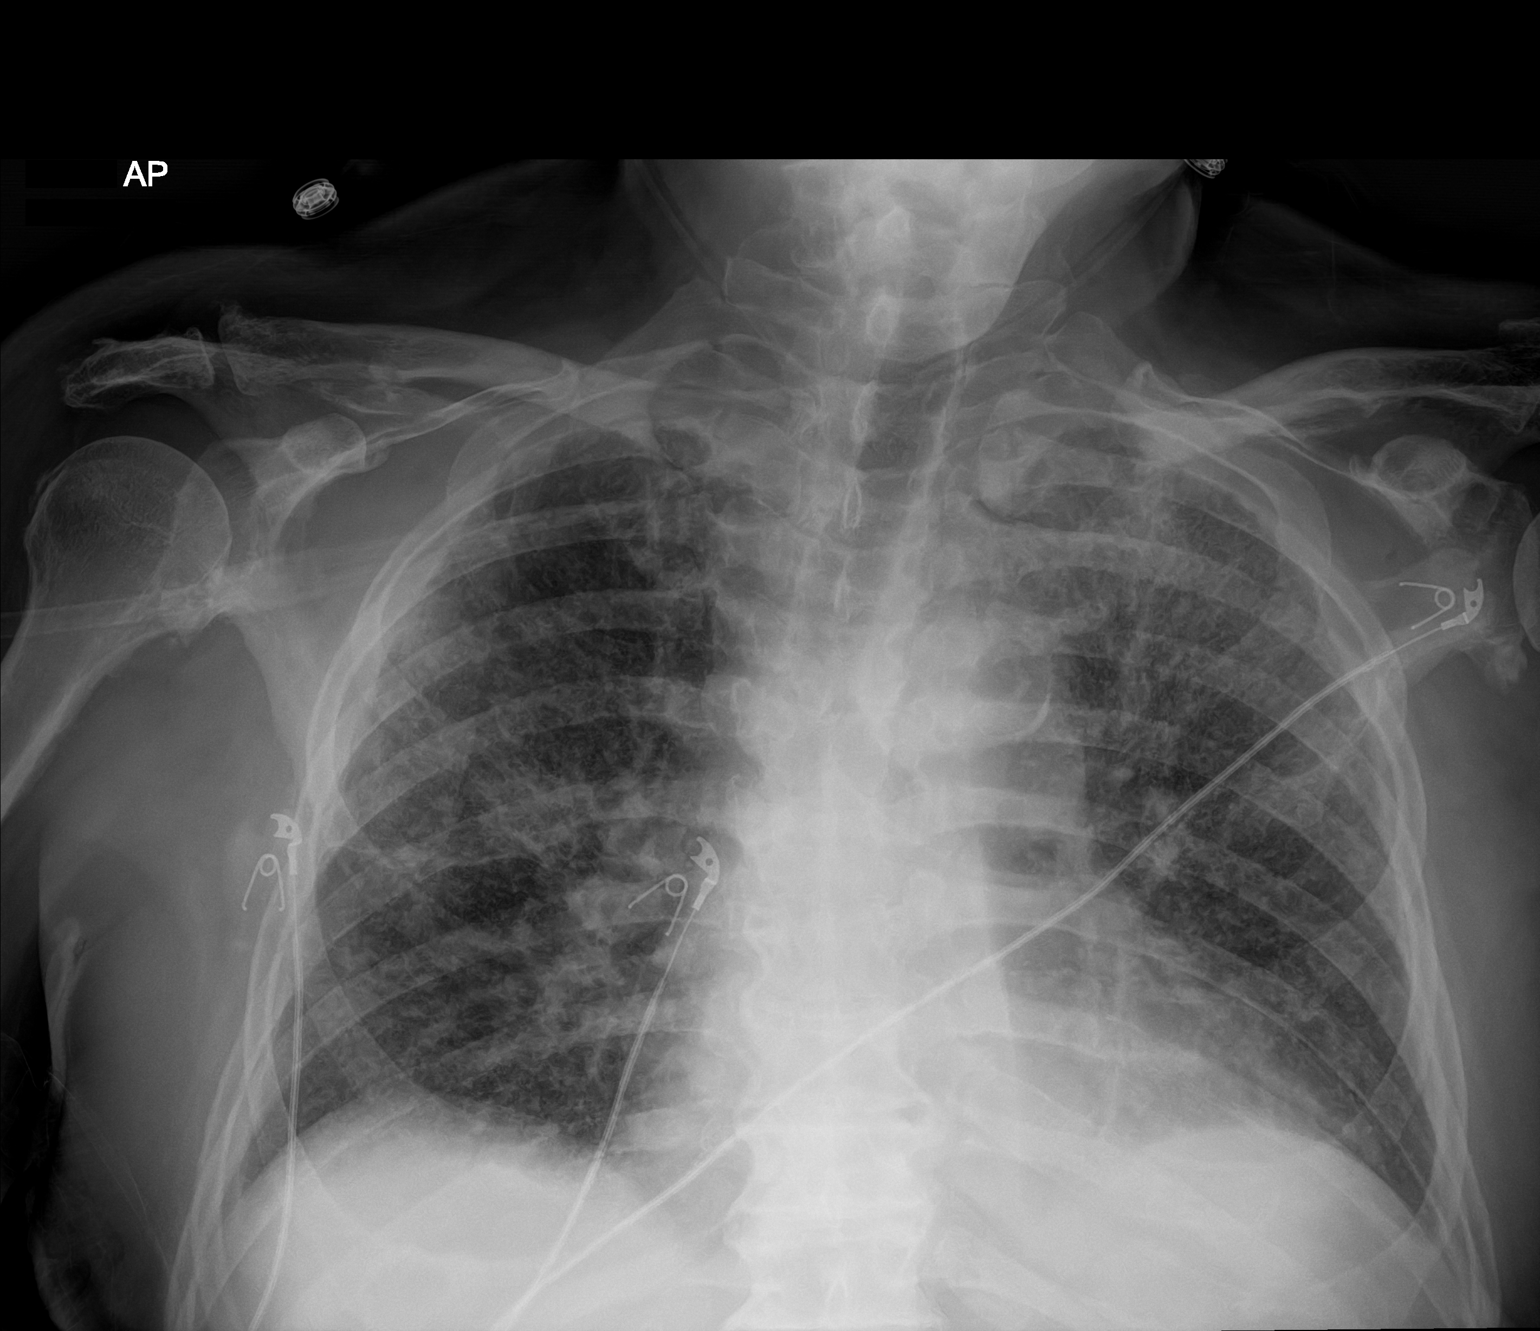

[1 of 1 positions shown; findings below may reference images not displayed]

FINDINGS: Cardiomegaly. Diffuse bilateral interstitial infiltrates again
noted. Slight improvement from prior exam. No pleural effusion or
pneumothorax. Thoracic spine scoliosis and degenerative change.
IMPRESSION: 1. Cardiomegaly.
2. Diffuse bilateral interstitial infiltrates again noted. Slight
improvement from prior exam.

## 2022-07-07 IMAGING — US US EXTREM  UP VENOUS*R*
1 series · 13 of 24 positions shown · non-contrast
Comparison: None.

CLINICAL DATA: Right upper extremity edema. History of diabetes.
Former smoker. Evaluate for DVT.



[Series 1: us venous img upper uni right (dvt) · portal-venous · 13 of 41 slices shown]
[im 1/41]
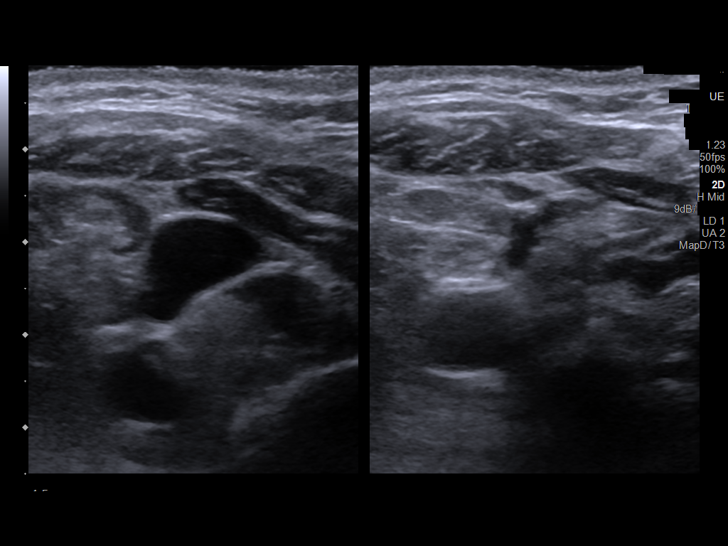
[im 4/41]
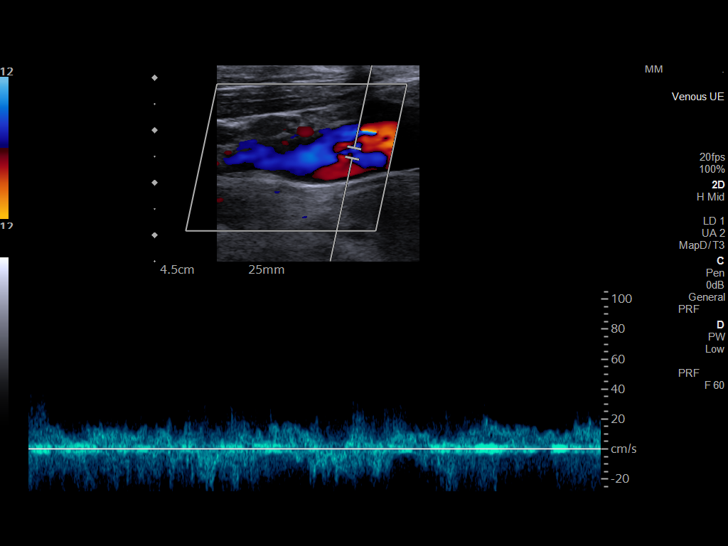
[im 7/41]
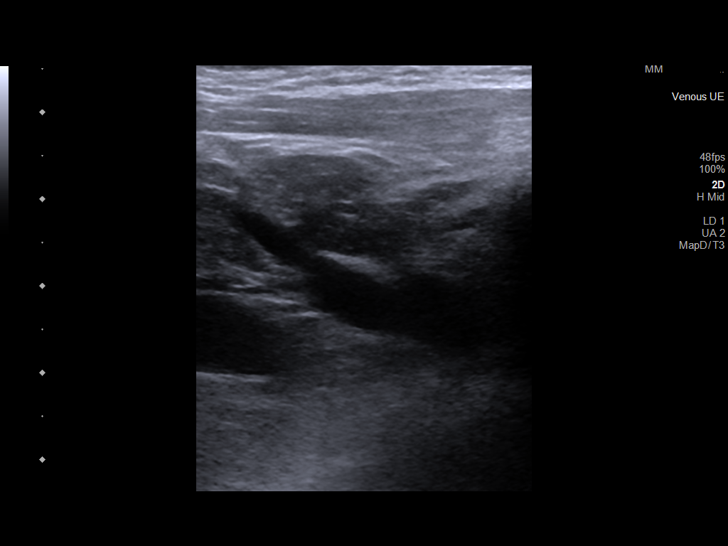
[im 11/41]
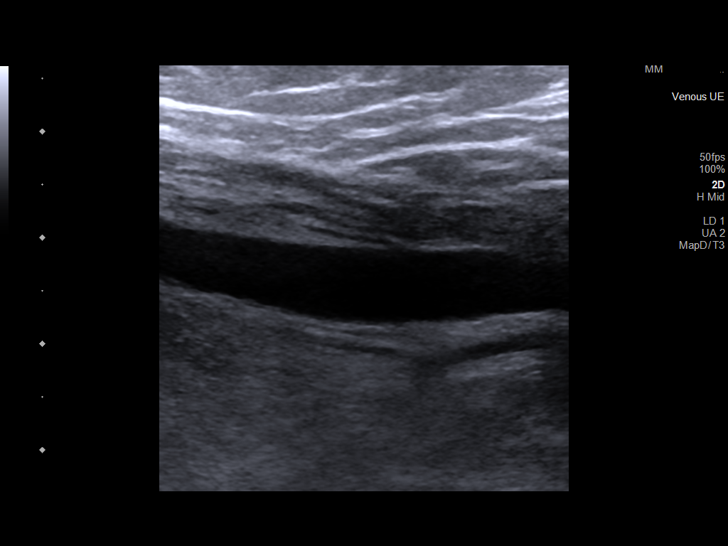
[im 14/41]
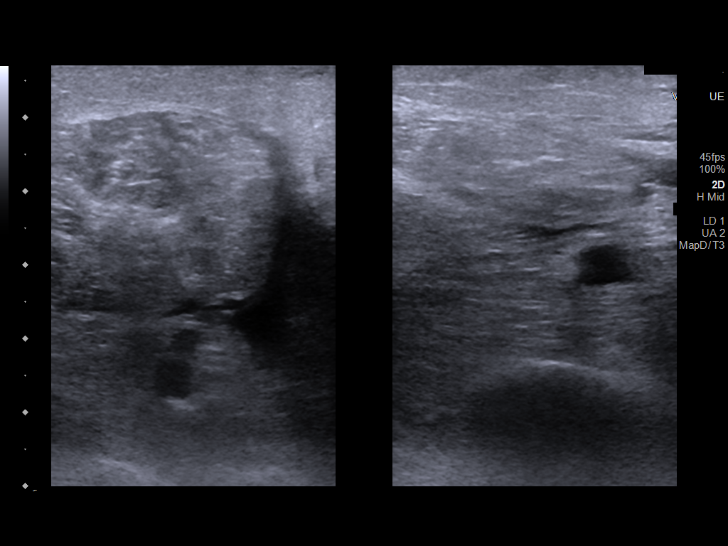
[im 18/41]
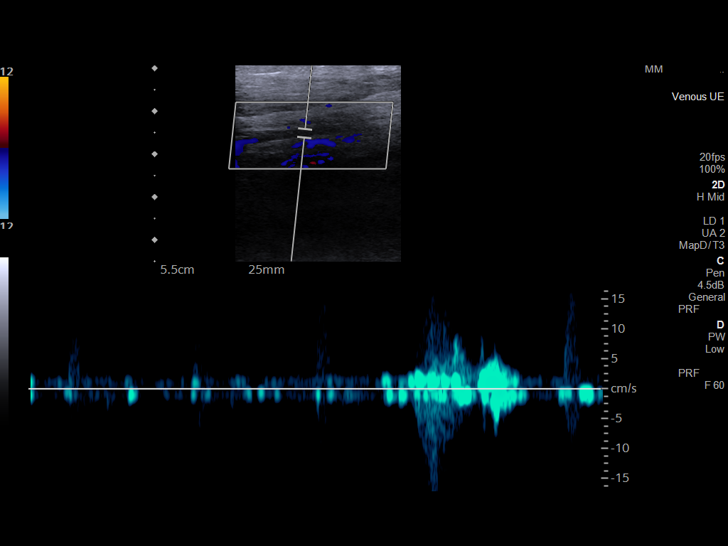
[im 21/41]
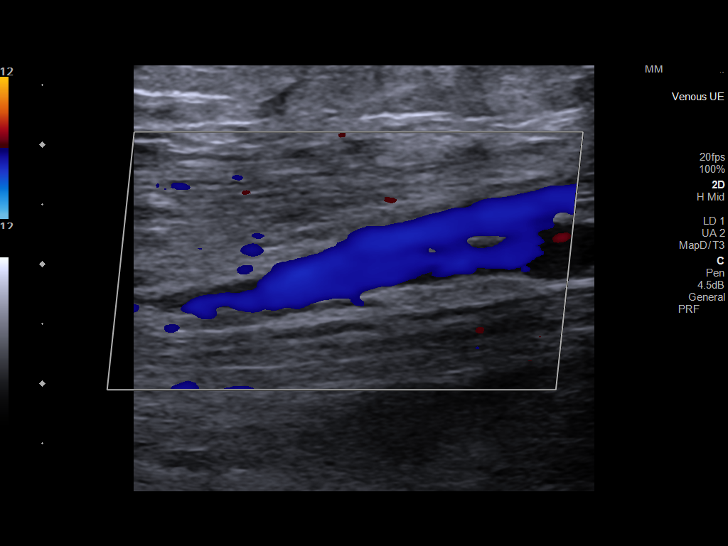
[im 23/41]
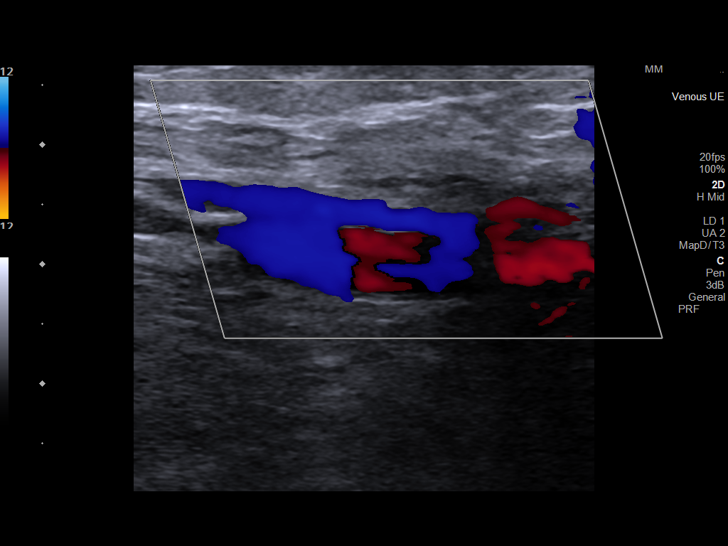
[im 27/41]
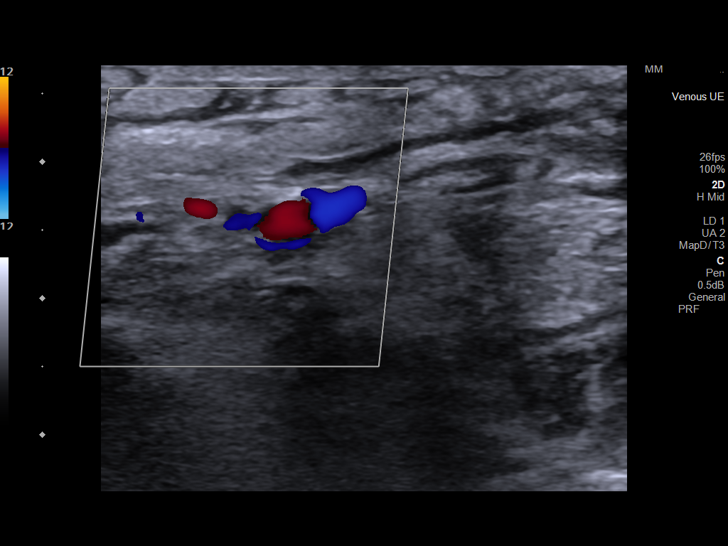
[im 30/41]
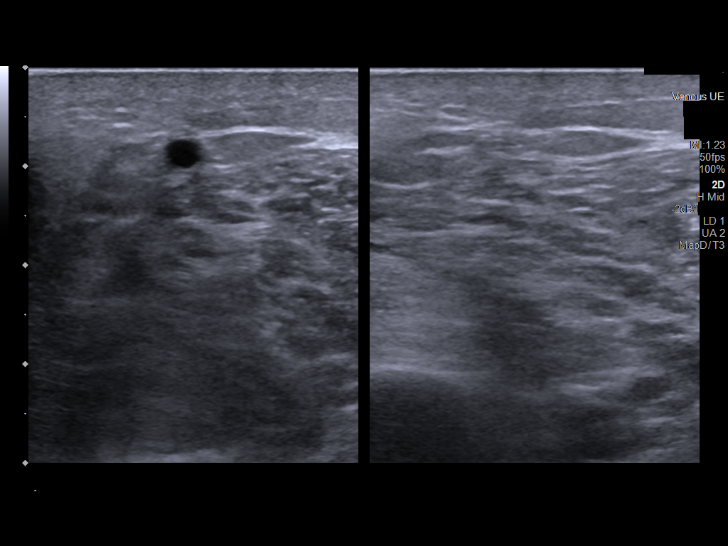
[im 34/41]
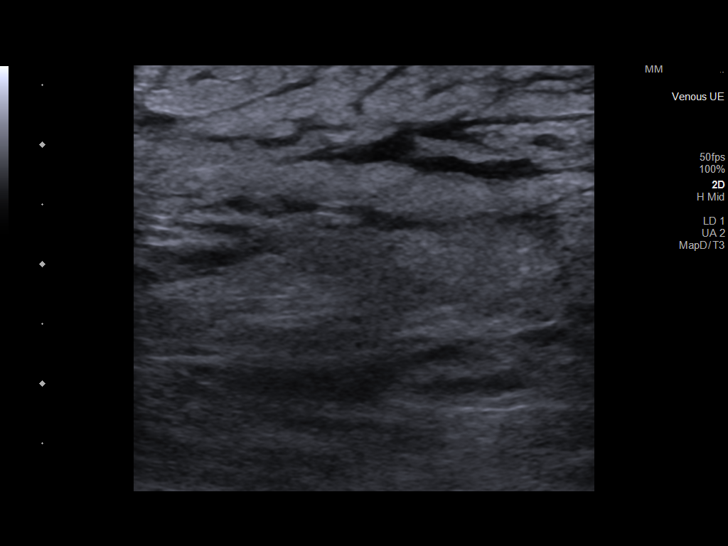
[im 37/41]
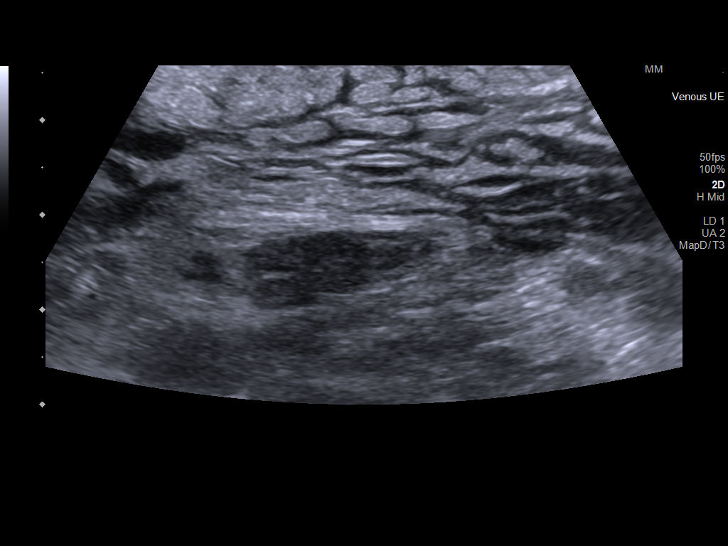
[im 41/41]
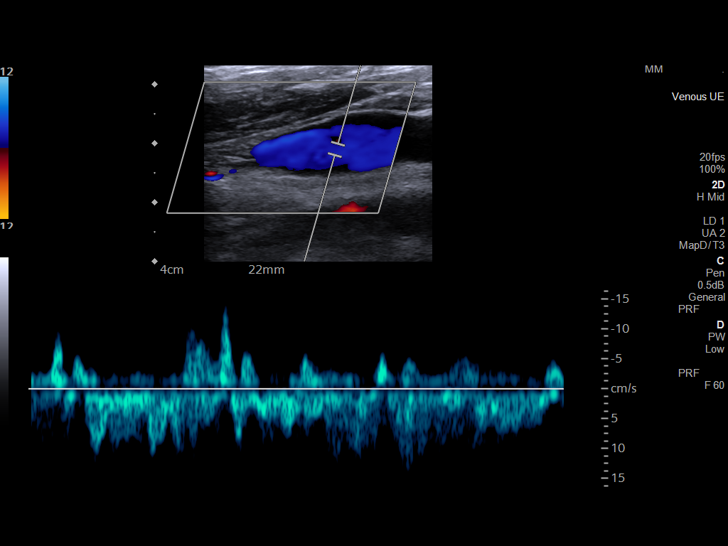

[13 of 24 positions shown; findings below may reference images not displayed]

FINDINGS: Contralateral Subclavian Vein: Respiratory phasicity is normal and
symmetric with the symptomatic side. No evidence of thrombus. Normal
compressibility.

Internal Jugular Vein: No evidence of thrombus. Normal
compressibility, respiratory phasicity and response to augmentation.

Subclavian Vein: No evidence of thrombus. Normal compressibility,
respiratory phasicity and response to augmentation.

Axillary Vein: No evidence of thrombus. Normal compressibility,
respiratory phasicity and response to augmentation.

Cephalic Vein: No evidence of thrombus. Normal compressibility,
respiratory phasicity and response to augmentation.

Basilic Vein: Not well visualized.

Brachial Veins: No evidence of thrombus. Normal compressibility,
respiratory phasicity and response to augmentation.

Radial Veins: No evidence of thrombus. Normal compressibility,
respiratory phasicity and response to augmentation.

Ulnar Veins: No evidence of thrombus. Normal compressibility,
respiratory phasicity and response to augmentation.

Venous Reflux:  None visualized.

Other Findings: There is a large amount of subcutaneous edema at the
level of the right forearm (images 34 through 38).
IMPRESSION: 1. No evidence of DVT within the right upper extremity.
2. Large amount of subcutaneous edema at the level of the right
forearm

## 2023-03-07 IMAGING — DX DG CHEST 2V
2 series · 2 of 2 positions shown · non-contrast
Comparison: 08/05/2020

CLINICAL DATA: Amiodarone toxicity.

EXAM:
CHEST - 2 VIEW

[chest pa]
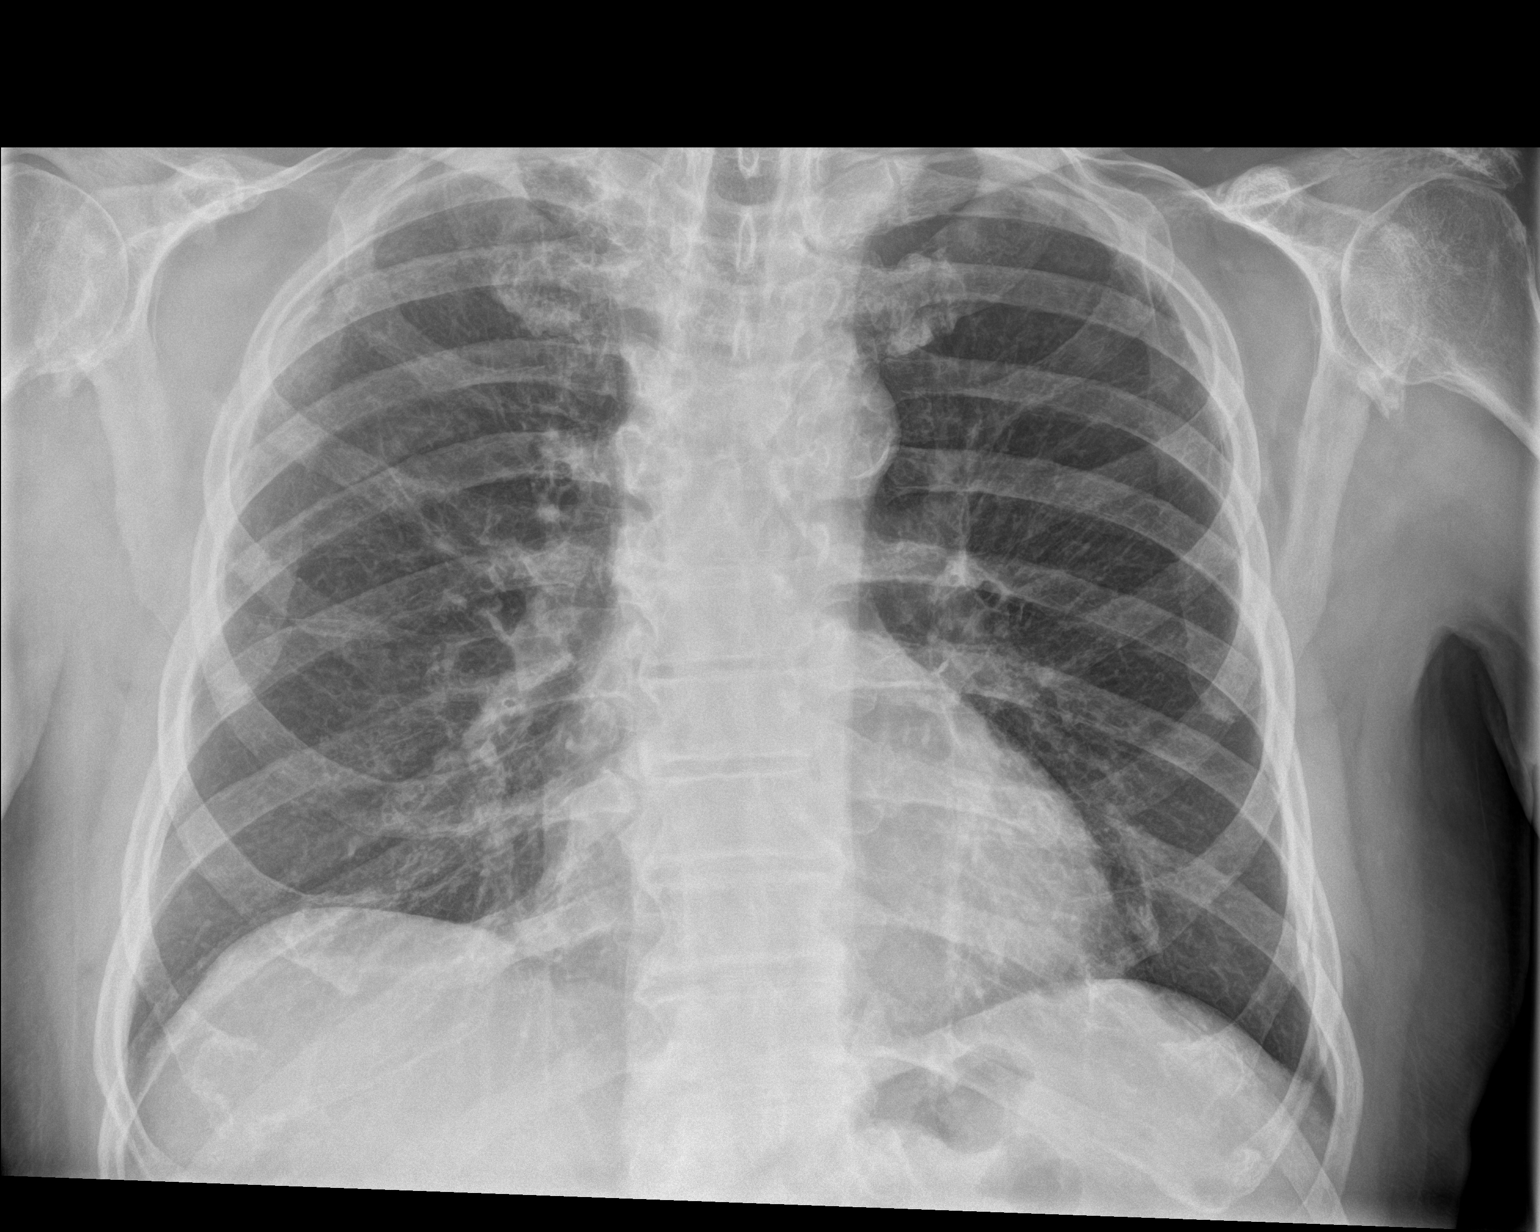

[chest lat]
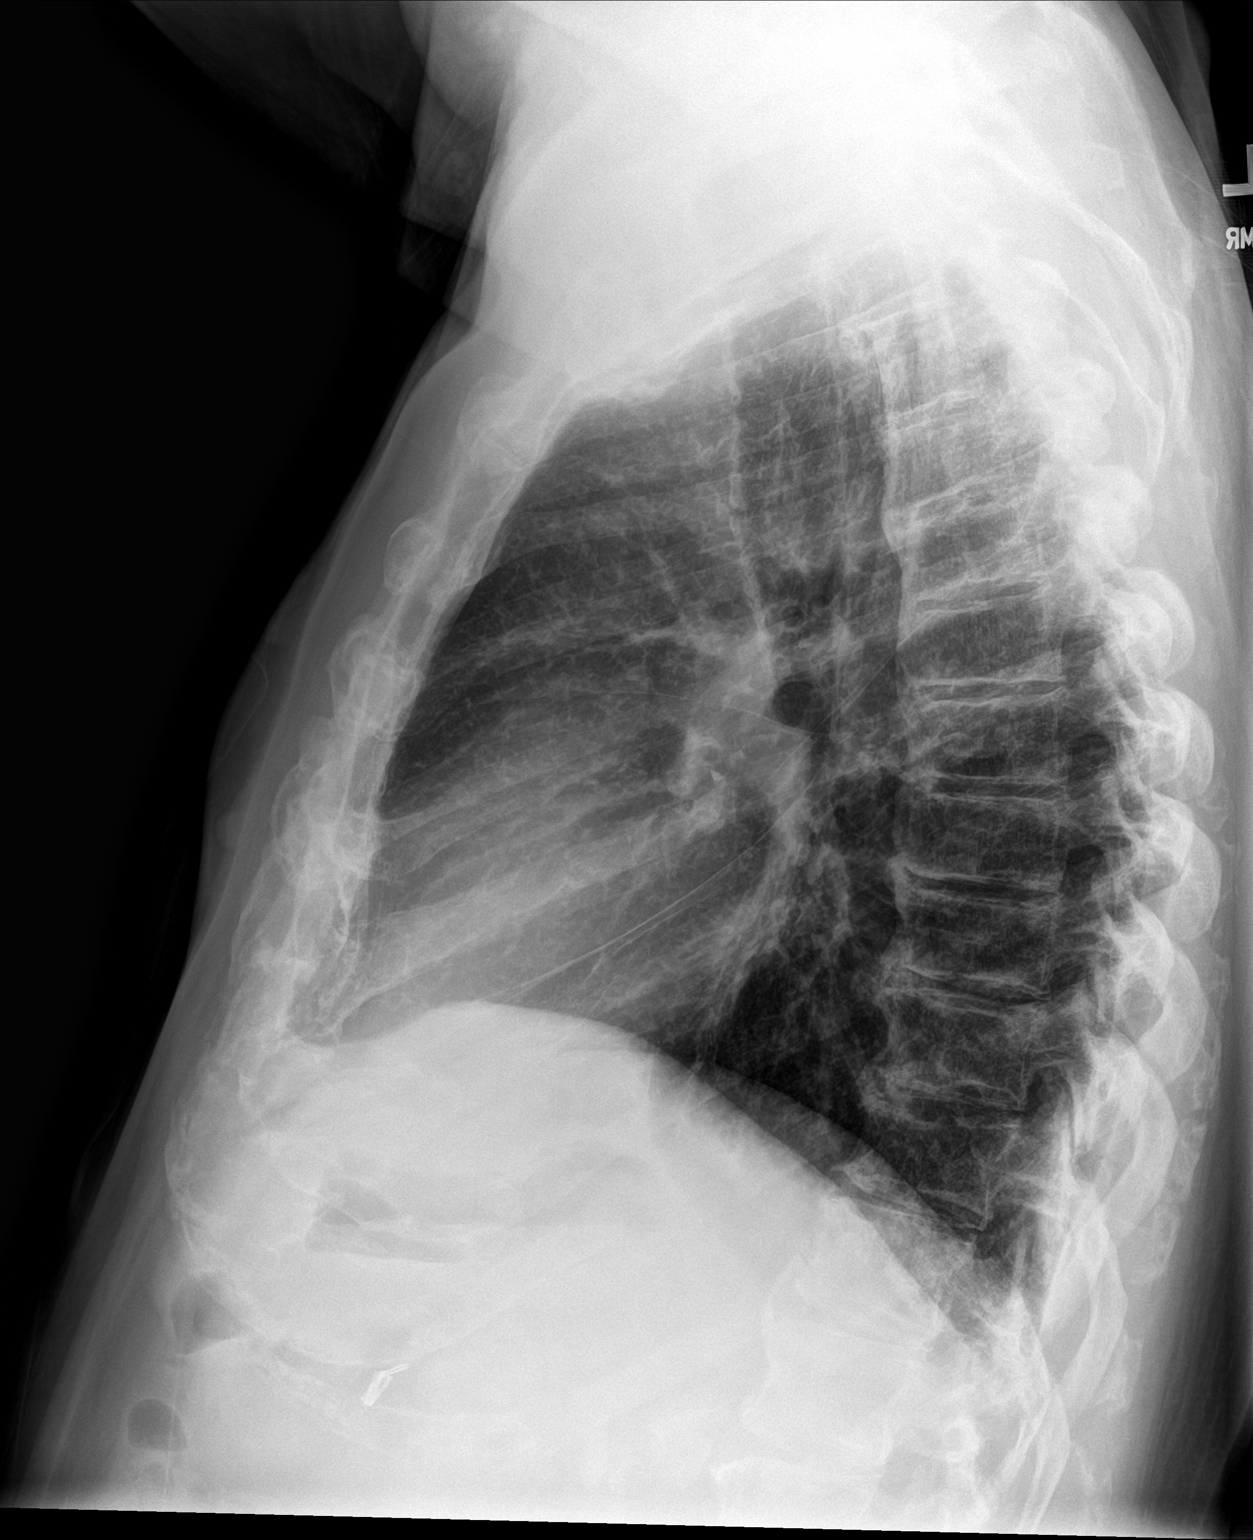

[2 of 2 positions shown; findings below may reference images not displayed]

FINDINGS: The cardiomediastinal silhouette is unchanged with normal heart
size. Aortic atherosclerosis is noted. The lungs are well inflated
with similar appearance of mild chronic interstitial coarsening. No
confluent airspace opacity, edema, pleural effusion, pneumothorax is
identified. Upper abdominal surgical clips and bilateral
glenohumeral arthropathy are noted.
IMPRESSION: No active cardiopulmonary disease.
